# Patient Record
Sex: Female | Born: 1945
Health system: Southern US, Community
[De-identification: ages and names within clinical notes are randomized; demographics above are authoritative.]

## PROBLEM LIST (undated history)

## (undated) DIAGNOSIS — F419 Anxiety disorder, unspecified: Secondary | ICD-10-CM

## (undated) DIAGNOSIS — I499 Cardiac arrhythmia, unspecified: Secondary | ICD-10-CM

## (undated) DIAGNOSIS — F411 Generalized anxiety disorder: Secondary | ICD-10-CM

## (undated) DIAGNOSIS — I2510 Atherosclerotic heart disease of native coronary artery without angina pectoris: Secondary | ICD-10-CM

## (undated) DIAGNOSIS — M1990 Unspecified osteoarthritis, unspecified site: Secondary | ICD-10-CM

## (undated) DIAGNOSIS — I509 Heart failure, unspecified: Secondary | ICD-10-CM

## (undated) DIAGNOSIS — N189 Chronic kidney disease, unspecified: Secondary | ICD-10-CM

## (undated) DIAGNOSIS — F32A Depression, unspecified: Secondary | ICD-10-CM

## (undated) DIAGNOSIS — K219 Gastro-esophageal reflux disease without esophagitis: Secondary | ICD-10-CM

## (undated) DIAGNOSIS — E782 Mixed hyperlipidemia: Secondary | ICD-10-CM

## (undated) DIAGNOSIS — E785 Hyperlipidemia, unspecified: Secondary | ICD-10-CM

## (undated) DIAGNOSIS — F329 Major depressive disorder, single episode, unspecified: Secondary | ICD-10-CM

## (undated) DIAGNOSIS — Z85528 Personal history of other malignant neoplasm of kidney: Secondary | ICD-10-CM

## (undated) DIAGNOSIS — I10 Essential (primary) hypertension: Secondary | ICD-10-CM

## (undated) DIAGNOSIS — N183 Chronic kidney disease, stage 3 unspecified: Secondary | ICD-10-CM

## (undated) DIAGNOSIS — D631 Anemia in chronic kidney disease: Secondary | ICD-10-CM

## (undated) DIAGNOSIS — Z87442 Personal history of urinary calculi: Secondary | ICD-10-CM

## (undated) DIAGNOSIS — C801 Malignant (primary) neoplasm, unspecified: Secondary | ICD-10-CM

## (undated) HISTORY — DX: Personal history of other malignant neoplasm of kidney: Z85.528

## (undated) HISTORY — DX: Depression, unspecified: F32.A

## (undated) HISTORY — DX: Major depressive disorder, single episode, unspecified: F32.9

## (undated) HISTORY — DX: Malignant (primary) neoplasm, unspecified: C80.1

## (undated) HISTORY — DX: Essential (primary) hypertension: I10

## (undated) HISTORY — PX: ABDOMINAL HYSTERECTOMY: SHX81

## (undated) HISTORY — DX: Anxiety disorder, unspecified: F41.9

## (undated) HISTORY — PX: OTHER SURGICAL HISTORY: SHX169

## (undated) HISTORY — DX: Hyperlipidemia, unspecified: E78.5

## (undated) HISTORY — DX: Atherosclerotic heart disease of native coronary artery without angina pectoris: I25.10

---

## 1978-10-09 HISTORY — PX: VAGINAL HYSTERECTOMY: SUR661

## 1998-05-28 ENCOUNTER — Ambulatory Visit (HOSPITAL_BASED_OUTPATIENT_CLINIC_OR_DEPARTMENT_OTHER): Admission: RE | Admit: 1998-05-28 | Discharge: 1998-05-28 | Payer: Self-pay | Admitting: Surgery

## 1998-11-05 ENCOUNTER — Encounter: Payer: Self-pay | Admitting: Obstetrics and Gynecology

## 1998-11-05 ENCOUNTER — Ambulatory Visit (HOSPITAL_COMMUNITY): Admission: RE | Admit: 1998-11-05 | Discharge: 1998-11-05 | Payer: Self-pay | Admitting: Obstetrics and Gynecology

## 1999-05-17 ENCOUNTER — Other Ambulatory Visit: Admission: RE | Admit: 1999-05-17 | Discharge: 1999-05-17 | Payer: Self-pay | Admitting: Obstetrics and Gynecology

## 1999-11-23 ENCOUNTER — Encounter: Payer: Self-pay | Admitting: Obstetrics and Gynecology

## 1999-11-23 ENCOUNTER — Ambulatory Visit (HOSPITAL_COMMUNITY): Admission: RE | Admit: 1999-11-23 | Discharge: 1999-11-23 | Payer: Self-pay | Admitting: Obstetrics and Gynecology

## 2000-05-24 ENCOUNTER — Other Ambulatory Visit: Admission: RE | Admit: 2000-05-24 | Discharge: 2000-05-24 | Payer: Self-pay | Admitting: Obstetrics and Gynecology

## 2000-11-26 ENCOUNTER — Ambulatory Visit (HOSPITAL_COMMUNITY): Admission: RE | Admit: 2000-11-26 | Discharge: 2000-11-26 | Payer: Self-pay | Admitting: Obstetrics and Gynecology

## 2000-11-26 ENCOUNTER — Encounter: Payer: Self-pay | Admitting: Obstetrics and Gynecology

## 2001-06-18 ENCOUNTER — Other Ambulatory Visit: Admission: RE | Admit: 2001-06-18 | Discharge: 2001-06-18 | Payer: Self-pay | Admitting: Obstetrics and Gynecology

## 2001-12-03 ENCOUNTER — Ambulatory Visit (HOSPITAL_COMMUNITY): Admission: RE | Admit: 2001-12-03 | Discharge: 2001-12-03 | Payer: Self-pay | Admitting: Obstetrics and Gynecology

## 2001-12-03 ENCOUNTER — Encounter: Payer: Self-pay | Admitting: Obstetrics and Gynecology

## 2002-12-08 ENCOUNTER — Ambulatory Visit (HOSPITAL_COMMUNITY): Admission: RE | Admit: 2002-12-08 | Discharge: 2002-12-08 | Payer: Self-pay | Admitting: Obstetrics and Gynecology

## 2002-12-08 ENCOUNTER — Encounter: Payer: Self-pay | Admitting: Obstetrics and Gynecology

## 2003-11-20 ENCOUNTER — Encounter: Admission: RE | Admit: 2003-11-20 | Discharge: 2003-11-20 | Payer: Self-pay | Admitting: Family Medicine

## 2004-12-08 ENCOUNTER — Encounter: Admission: RE | Admit: 2004-12-08 | Discharge: 2004-12-08 | Payer: Self-pay | Admitting: Family Medicine

## 2005-12-20 ENCOUNTER — Encounter: Admission: RE | Admit: 2005-12-20 | Discharge: 2005-12-20 | Payer: Self-pay | Admitting: Family Medicine

## 2006-06-18 ENCOUNTER — Other Ambulatory Visit: Admission: RE | Admit: 2006-06-18 | Discharge: 2006-06-18 | Payer: Self-pay | Admitting: Obstetrics and Gynecology

## 2006-10-09 DIAGNOSIS — D759 Disease of blood and blood-forming organs, unspecified: Secondary | ICD-10-CM

## 2006-10-09 HISTORY — DX: Disease of blood and blood-forming organs, unspecified: D75.9

## 2006-12-08 DIAGNOSIS — Z8551 Personal history of malignant neoplasm of bladder: Secondary | ICD-10-CM

## 2006-12-08 HISTORY — DX: Personal history of malignant neoplasm of bladder: Z85.51

## 2006-12-12 ENCOUNTER — Ambulatory Visit (HOSPITAL_BASED_OUTPATIENT_CLINIC_OR_DEPARTMENT_OTHER): Admission: RE | Admit: 2006-12-12 | Discharge: 2006-12-12 | Payer: Self-pay | Admitting: Urology

## 2006-12-12 ENCOUNTER — Encounter (INDEPENDENT_AMBULATORY_CARE_PROVIDER_SITE_OTHER): Payer: Self-pay | Admitting: Specialist

## 2006-12-12 HISTORY — PX: TRANSURETHRAL RESECTION OF BLADDER TUMOR: SHX2575

## 2006-12-24 ENCOUNTER — Encounter: Admission: RE | Admit: 2006-12-24 | Discharge: 2006-12-24 | Payer: Self-pay | Admitting: Family Medicine

## 2007-01-08 DIAGNOSIS — Z8554 Personal history of malignant neoplasm of ureter: Secondary | ICD-10-CM

## 2007-01-08 HISTORY — DX: Personal history of malignant neoplasm of ureter: Z85.54

## 2007-01-09 ENCOUNTER — Ambulatory Visit (HOSPITAL_COMMUNITY): Admission: RE | Admit: 2007-01-09 | Discharge: 2007-01-09 | Payer: Self-pay | Admitting: Urology

## 2007-01-09 ENCOUNTER — Encounter (INDEPENDENT_AMBULATORY_CARE_PROVIDER_SITE_OTHER): Payer: Self-pay | Admitting: *Deleted

## 2007-01-09 ENCOUNTER — Encounter (INDEPENDENT_AMBULATORY_CARE_PROVIDER_SITE_OTHER): Payer: Self-pay | Admitting: Specialist

## 2007-01-09 HISTORY — PX: CYSTOSCOPY WITH RETROGRADE PYELOGRAM, URETEROSCOPY AND STENT PLACEMENT: SHX5789

## 2007-02-06 ENCOUNTER — Inpatient Hospital Stay (HOSPITAL_COMMUNITY): Admission: RE | Admit: 2007-02-06 | Discharge: 2007-02-10 | Payer: Self-pay | Admitting: Urology

## 2007-02-06 ENCOUNTER — Encounter (INDEPENDENT_AMBULATORY_CARE_PROVIDER_SITE_OTHER): Payer: Self-pay | Admitting: Specialist

## 2007-02-06 DIAGNOSIS — Z905 Acquired absence of kidney: Secondary | ICD-10-CM

## 2007-02-06 HISTORY — PX: NEPHROURETERECTOMY: SHX5135

## 2007-02-06 HISTORY — DX: Acquired absence of kidney: Z90.5

## 2007-12-25 ENCOUNTER — Encounter: Admission: RE | Admit: 2007-12-25 | Discharge: 2007-12-25 | Payer: Self-pay | Admitting: Family Medicine

## 2008-01-27 ENCOUNTER — Encounter: Admission: RE | Admit: 2008-01-27 | Discharge: 2008-01-27 | Payer: Self-pay | Admitting: Family Medicine

## 2008-02-14 ENCOUNTER — Ambulatory Visit (HOSPITAL_COMMUNITY): Admission: RE | Admit: 2008-02-14 | Discharge: 2008-02-14 | Payer: Self-pay | Admitting: Urology

## 2008-09-23 ENCOUNTER — Ambulatory Visit (HOSPITAL_COMMUNITY): Admission: RE | Admit: 2008-09-23 | Discharge: 2008-09-23 | Payer: Self-pay | Admitting: Urology

## 2008-12-25 ENCOUNTER — Encounter: Admission: RE | Admit: 2008-12-25 | Discharge: 2008-12-25 | Payer: Self-pay | Admitting: Family Medicine

## 2009-08-24 ENCOUNTER — Ambulatory Visit (HOSPITAL_COMMUNITY): Admission: RE | Admit: 2009-08-24 | Discharge: 2009-08-24 | Payer: Self-pay | Admitting: Urology

## 2009-10-09 HISTORY — PX: CATARACT EXTRACTION W/ INTRAOCULAR LENS IMPLANT: SHX1309

## 2009-12-27 ENCOUNTER — Encounter: Admission: RE | Admit: 2009-12-27 | Discharge: 2009-12-27 | Payer: Self-pay | Admitting: Family Medicine

## 2009-12-29 ENCOUNTER — Encounter: Admission: RE | Admit: 2009-12-29 | Discharge: 2009-12-29 | Payer: Self-pay | Admitting: Family Medicine

## 2010-07-04 ENCOUNTER — Encounter: Admission: RE | Admit: 2010-07-04 | Discharge: 2010-07-04 | Payer: Self-pay | Admitting: Family Medicine

## 2010-11-29 ENCOUNTER — Other Ambulatory Visit: Payer: Self-pay | Admitting: Family Medicine

## 2010-11-29 DIAGNOSIS — Z09 Encounter for follow-up examination after completed treatment for conditions other than malignant neoplasm: Secondary | ICD-10-CM

## 2011-01-03 ENCOUNTER — Ambulatory Visit
Admission: RE | Admit: 2011-01-03 | Discharge: 2011-01-03 | Disposition: A | Discharge status: Home / Self Care | Payer: BC Managed Care – PPO | Source: Ambulatory Visit | Attending: Family Medicine | Admitting: Family Medicine

## 2011-01-03 ENCOUNTER — Other Ambulatory Visit: Payer: Self-pay | Admitting: Family Medicine

## 2011-01-03 DIAGNOSIS — Z09 Encounter for follow-up examination after completed treatment for conditions other than malignant neoplasm: Secondary | ICD-10-CM

## 2011-02-24 NOTE — Op Note (Signed)
[name], WEICHERT             ACCOUNT NO.:  [id number]   MEDICAL RECORD NO.:  [id number]          PATIENT TYPE:  AMB   LOCATION:  DAY                          FACILITY:  [facility]   PHYSICIAN:  Bertram Millard. Dahlstedt, M.D.DATE OF BIRTH:  1946-03-09   DATE OF PROCEDURE:  01/09/2007  DATE OF DISCHARGE:                               OPERATIVE REPORT   PREOPERATIVE DIAGNOSIS:  Right hydronephrosis, atrophic right kidney,  history of bladder cancer.   POSTOPERATIVE DIAGNOSIS:  Right hydronephrosis, atrophic right kidney,  history of bladder cancer.   OPERATION PERFORMED:  Cystoscopy, bladder biopsies, bladder washings,  right ureteroscopy, bladder brush biopsy of right ureter, ureteral  washings, double-J stent placement on the right.   SURGEON:  Bertram Millard. Dahlstedt, M.D.   ANESTHESIA:  General.   COMPLICATIONS:  None.   INDICATIONS FOR PROCEDURE:  The patient is a 65 year old female who  presented to Dr. Vonita Moss in late February with gross hematuria.  Evaluation revealed a bladder tumor.  She underwent transurethral  resection of bladder tumor which revealed papillary bladder tumor,  overlying the right ureteral orifice with high grade features.  There is  no evident invasion.  CT evaluation afterwards revealed nonfunctioning  significantly hydronephrotic right kidney with visualization of ureter  all the way down to the bladder.   She was referred to me for possible right nephroureterectomy.  In  consulting on the patient, I suggested we directly visualize her right  ureter, especially the distal part, to either confirm the presence or  lack of ureteral tumor.  As the patient has an obscure ureteral orifice,  she is scheduled for percutaneous placement of right ureteral catheter  coming out her right ureteral orifice, so I can obtain access to the  ureter.  The risks and complications of reinspecting the bladder with  biopsies, washings and right ureteroscopy were discussed  at length with  the patient.  She understands these and agrees to proceed.   DESCRIPTION OF PROCEDURE:  The patient underwent placement of a right  percutaneous ureteral catheter into the bladder by Arn Medal, MD in  the radiology area.  Following this, she was taken to the holding area  where I identified the patient, marked the surgical side and then she  was taken to the operating room.  General anesthetic was administered.  She was placed in dorsal lithotomy position.  Genitalia and perineum  were prepped and draped.  I advanced a 0.038 inch guidewire through the  percutaneous catheter.  I then advanced this under fluoroscopic guidance  into the bladder.  Cystoscopy was then performed of the bladder.  She  had some bullous edema around the right trigonal area.  No discrete  papillary lesions were seen, however.  The catheter and guidewire were  seen coming from the right ureter.  Bladder washings were taken and sent  for cytology.  Two to three bladder biopsies were taken in the  trigonal area around the right ureteral orifice.  These were sent as  bladder biopsies.  I then grasped the guidewire, and advanced a 35 cm  ureteral access sheath (inner core)  up the guidewire which I advanced  out her urethra.  I then was able to place the guidewire beside the  previously placed ureteral catheter.  Under fluoroscopic guidance, this  was advanced up to the right renal pelvic area.  I then placed a  ureteroscope overtop of this and gained access to the ureter.  The  entire ureter was visualized.  The proximal, mid and most of the distal  ureter were normal although somewhat tortuous.  No lesions were seen.  Right at the ureterovesical junction, there seemed to be some  abnormalities to the mucosa.  Ureteral washings were sent, and then this  was brush biopsied. This was sent for cytology.  I am mildly suspicious  that there it tumor in this area.   At this point the ureteroscope was  removed.  There was no active  bleeding from biopsy sites.  The guidewire was left in and overtop this  a 6 French 24 cm contour stent was placed using fluoroscopic and  cystoscopic guidance.  The percutaneous tube and guidewire placed were  then removed.  The bladder was drained and the cystoscope removed.  The  patient tolerated the procedure well. She was awakened, extubated and  taken to PACU in stable condition.      Bertram Millard. Dahlstedt, M.D.  Electronically Signed     SMD/MEDQ  D:  01/09/2007  T:  01/09/2007  Job:  [id number]

## 2011-02-24 NOTE — Discharge Summary (Signed)
[name], [name]             ACCOUNT NO.:  [id number]   MEDICAL RECORD NO.:  [id number]          PATIENT TYPE:  INP   LOCATION:  1404                         FACILITY:  [facility]   PHYSICIAN:  Bertram Millard. Dahlstedt, M.D.DATE OF BIRTH:  May 10, 1946   DATE OF ADMISSION:  02/06/2007  DATE OF DISCHARGE:  02/10/2007                               DISCHARGE SUMMARY   ADMISSION DIAGNOSIS:  Right ureteral tumor with atrophic right kidney.   DISCHARGE DIAGNOSIS:  Right ureteral tumor with atrophic right kidney.   PROCEDURE:  Right laparoscopic nephroureterectomy on 02/06/2007.   HISTORY OF PRESENT ILLNESS:  For full details please see dictated H&P.  Briefly, [name] is a 65 year old female with a history of  hematuria.  [name] had evidence of papillary tumor involving the right  trigone and right ureteral orifice.  [name] was treated with TURBT and was  found to be a T1 lesion which was re-resected with negative biopsies.  [name] also had findings of a tumor in the right ureter which was confirmed  endoscopically and with a biopsy.  [name] is admitted this hospitalization  for right laparoscopic nephroureterectomy.   HOSPITAL COURSE:  The patient was brought to the hospital on the day of  procedure and taken for nephroureterectomy.  [name] tolerated this well  without complication and was transferred to the floor.  Postoperatively  her course was uncomplicated.  [name] did have blood loss anemia after  surgery which was stable throughout her recovery.  Her diet was slowly  advanced.  [name] was ambulating without difficulty and deemed stable for  discharge on postop day #4.   DISPOSITION AND DISCHARGE INSTRUCTIONS:  The patient was discharged to  home in stable condition.  [name] was instructed to resume her previous  diet.  Activity level was instructed to increase activity slowly.  [name]  may shower.  [name] was instructed not to lift for 4 weeks or drive for 2  weeks.   MEDICATIONS AT DISCHARGE:  1. Percocet  as needed for pain.  2. Keflex twice a day  3. Hydrochlorothiazide daily.  4. Zocor daily.  5. Ambien as needed.  6. Estradiol patch.  7. Iron.  8. Phenergan p.r.n.   FOLLOW UP:  [name] will follow up with Dr. Retta Diones in approximately 2  weeks for catheter removal.   [name] was instructed to call with any questions or concerns regarding any  fevers greater than 101.5 or increased abdominal pain, nausea, vomiting,  redness or drainage from her incision or if [name] should have any  difficulty with her Foley catheter.  [name] understood and agreed to these  instructions.     ______________________________  Terie Purser, MD      Bertram Millard. Dahlstedt, M.D.  Electronically Signed    JH/MEDQ  D:  02/22/2007  T:  02/22/2007  Job:  [id number]

## 2011-02-24 NOTE — Op Note (Signed)
[name], [name]             ACCOUNT NO.:  [id number]   MEDICAL RECORD NO.:  [id number]          PATIENT TYPE:  AMB   LOCATION:  NESC                         FACILITY:  [facility]   PHYSICIAN:  Maretta Bees. Vonita Moss, M.D.DATE OF BIRTH:  [birth year]   DATE OF PROCEDURE:  DATE OF DISCHARGE:                               OPERATIVE REPORT   PREOPERATIVE DIAGNOSIS:  Large bladder tumor with high-grade right  hydronephrosis in ureter.   POSTOPERATIVE DIAGNOSIS:  Large bladder tumor with high-grade right  hydronephrosis in ureter.   PROCEDURE:  Cystoscopy and transurethral resection of large bladder  tumor on right trigone.   SURGEON:  Dr. Larey Dresser.   ANESTHESIA:  General.   INDICATIONS:  This lady was referred to me for noticing blood after  wiping post urination.  She has also had some back pain and suprapubic  discomfort.  She underwent cystoscopy and she was noted to have a large  papillary tumor on the right trigone.  CT scan showed chronic marked  right hydronephrosis in ureter down to the mass in the right side of the  bladder.  There was slight compensatory hypertrophy of the left kidney  and marked thinning of the right renal cortex.  Pelvic view showed a  polypoid posterior tumor on the right trigone measuring approximately 4  x 2 cm in size.  She is brought to the OR today for resection of this  tumor and possible retrograde pyelogram or double-J catheter.   PROCEDURE:  The patient brought to operating room, placed lithotomy  position.  External genitalia were prepped and draped in usual fashion.  She was cystoscoped and the only lesion in the bladder was the  previously described large papillary tumor covering the whole right  trigone and close to the bladder neck.  The left trigone was clear and  the left ureteral orifice was unremarkable.  I inserted a resectoscope  and resected this tumor down flat with the rest of the bladder floor.  Bimanual examination revealed  no indurated or thickened masses at this  time.  I am concerned that there is still some tumor up the distal  ureter.  Fulguration was obtained to get good hemostasis.  Estimated  blood  loss was 30 mL.  View of the large area of resection, I felt best to  leave in a 22-French Foley with 10 mL in the balloon and that was  inserted.  She was taken to recovery room in good condition with clear  irrigation.  We will decide in the recovery area whether she can go home  this afternoon or not.      Maretta Bees. Vonita Moss, M.D.  Electronically Signed     LJP/MEDQ  D:  12/12/2006  T:  12/12/2006  Job:  [id number]   cc:   Gerald Leitz, MD   Duncan Dull, M.D.  Fax: [phone]

## 2011-02-24 NOTE — Op Note (Signed)
[name], [name]             ACCOUNT NO.:  [id number]   MEDICAL RECORD NO.:  [id number]          PATIENT TYPE:  INP   LOCATION:  1404                         FACILITY:  [facility]   PHYSICIAN:  Bertram Millard. Dahlstedt, M.D.DATE OF BIRTH:  [birth year]   DATE OF PROCEDURE:  02/06/2007  DATE OF DISCHARGE:                               OPERATIVE REPORT   PREOPERATIVE DIAGNOSES:  1. Right ureteral tumor.  2. Hydronephrotic, poorly-functioning right kidney.   POSTOPERATIVE DIAGNOSES:  1. Right ureteral tumor.  2. Hydronephrotic, poorly-functioning right kidney.   PROCEDURE:  Laparoscopic right nephroureterectomy.   SURGEON:  Bertram Millard. Retta Diones, M.D.   RESIDENT:  Terie Purser, MD.   ANESTHESIA:  General.   ESTIMATED BLOOD LOSS:  1000 mL.   SPECIMENS:  1. Right kidney and ureter for pathologic analysis.  2. Right posterior bladder tissue for frozen analysis.   DRAIN:  Foley catheter, JP drain.   COMPLICATIONS:  None.   DISPOSITION:  Stable to post anesthesia care unit.   INDICATIONS FOR PROCEDURE:  [name] is a 65 year old female who has  a history of hematuria.  She was evaluated and found to have a right-  sided bladder tumor involving the right trigone and right UO.  This was  resected was found to be T1 disease.  Repeat resections were negative.  Further evaluation revealed a hydronephrotic, poorly-functioning right  kidney.  Upper tract evaluation and cytology was worrisome for upper  tract involvement in transitional cell carcinoma.  She was counseled  regarding the benefits of nephroureterectomy given the upper tract  disease.  After full discussion of the benefits and risks of the  procedure, she has agreed to proceed with a laparoscopic right  nephroureterectomy.  Consent was obtained.   DESCRIPTION OF PROCEDURE:  The patient was brought to the operating room  and was properly identified.  A time-out was performed to confirm the  correct patient, procedure and  side.  She was administered general  anesthesia, given preoperative antibiotics, and then placed in the  supine position with her right shoulder and side bumped approximately 30  degrees and prepped and draped in a sterile fashion.  Special care was  taken to properly pad and position pressure points to prevent peripheral  neuropathy and DVT.  The patient was then prepped and draped in a  sterile manner.  We began the procedure by making an incision just  inferior to the umbilicus for approximately 2 cm.  This was for the  Hasson camera port.  Dissection was carried down through the  subcutaneous tissues.  The fascia was divided using the Bovie cautery.  The peritoneum was entered bluntly and a finger was swept throughout the  abdomen to ensure no adhesions.  The Hasson port was then placed after  placement of two 2-0 Vicryl stay sutures in the fascia.  The camera was  introduced and the abdomen was carefully inspected.  There was no  evidence of a bowel injury or other evidence of metastatic disease in  the abdomen.  There were no other adhesions noted.  We then proceeded  with placement of our  ports.  We placed a 5-mm port in the midline just  caudal into the xiphoid.  This was placed under direct vision.  We then  placed another 12 mm port in just medial and superior to the ASIS.  We  also placed another 5 mm port just medial to the midaxillary line as  another working port.  We then proceeded with mobilization of the right  colon.  Using the harmonic scalpel, the white line of Toldt was incised  and the colon was reflected medially.  We then proceeded with anterior  dissection over the kidney to completely mobilize the bowel from the  kidney.  The ureter was then identified caudally.  The ureter was  significantly enlarged and had a stent in place.  We were able to  develop a plane between the ureter and the psoas muscle.  The ureter was  then reflected up and we carefully dissected  proximally to the kidney.  There were several large vessels that were clipped.  We then encountered  the kidney.  The kidney was markedly abnormal with a redundant pelvis  and very minimal renal parenchyma.  We continued working superiorly off  the ureter.  We encountered what appeared to be a renal artery and vein.  These were small vessels that were triply clipped and divided sharply.  We then continued working superiorly.  At this point we did encounter  another artery and vein, which was triply clipped and divided it.  At  this point the kidney was mobilized with the exception of the upper  pole.  This was freed using the harmonic scalpel and the kidney and  ureter were reflected inferiorly.  We then carefully inspected our  resection bed.  Hemostasis was obtained.  We carefully irrigated and  examined the area of the hilum as well as surrounding the resection  sites under low pressure to ensure adequate hemostasis.  There was an  area in the superior aspect of the dissection around the abdominal wall,  which was reinforced with a Surgicel.  We then turned our attention  inferiorly and dissected the remaining portion of the ureter as far as  we could using the laparoscopic instruments.  When this was finished, we  decided to perform the remaining procedure in an open fashion.   We then removed all of our instrument ports under direct vision.  There  was no port site bleeding.  We extended our incision from the 12-mm  working port in a Moroni fashion.  Dissection was carried down through  the subcutaneous and fascial layers to enter the abdomen.  The kidney  and ureter were then delivered through the incision.  We had placed a  Bookwalter retractor and obtained exposure of the distal aspect of the  ureter and bladder.  The ureter was freed of its surrounding attachments  using combination of sharp and blunt dissection.  The round ligament was encountered and was ligated and divided.   We then dissected the ureter  free to the level of the attachment at the detrusor.  The bladder was  filled through the Foley catheter.  Two 2-0 Vicryl stay sutures were  placed in the bladder.  We then incised the bladder using the Bovie in a  keyhole fashion around the right ureter.  The ureter and ureteral  orifice were dissected free and removed.  We did encounter a fair amount  of bleeding in this portion of the procedure.  This was controlled with  Bovie  cautery as well as 2-0 Vicryl figure-of-eight sutures.  When  hemostasis was adequately obtained, we did opt to send a portion of  tissue from the right posterior aspect of the bladder near our resection  site to confirm that this did not represent tumor.  This was negative.  We then proceeded to close the bladder.  The mucosa was closed in a  running fashion with 2-0 Vicryl suture.  We then closed the second layer  of detrusor over this in a running fashion with 2-0 Vicryl.  The bladder  was then irrigated and was found to be watertight.  We then placed a  Blake drain through a separate stab incision and left this in our  resection bed.  The fascia was then closed in a running fashion with #1  PDS. The wound was then irrigated and the skin was closed with  staples.  Remaining port sites were closed with staples.  The patient  was then awoken from anesthesia and transported to the recovery room in  stable condition.  There were no complications.  Please note, Dr.  Retta Diones was present and participated in all aspects of this procedure  as he is the primary surgeon.     ______________________________  Terie Purser, MD      Bertram Millard. Dahlstedt, M.D.  Electronically Signed    JH/MEDQ  D:  02/06/2007  T:  02/07/2007  Job:  [id number]

## 2011-02-24 NOTE — H&P (Signed)
[name], [name]             ACCOUNT NO.:  [id number]   MEDICAL RECORD NO.:  [id number]          PATIENT TYPE:  AMB   LOCATION:  DAY                          FACILITY:  [facility]   PHYSICIAN:  Bertram Millard. Dahlstedt, M.D.DATE OF BIRTH:  08/19/1946   DATE OF ADMISSION:  02/06/2007  DATE OF DISCHARGE:                              HISTORY & PHYSICAL   CHIEF COMPLAINT:  Right ureteral tumor, atrophic right kidney.   HISTORY OF PRESENT ILLNESS:  [name] is a [age]-year-old female who  has a history of hematuria.  She was originally referred by her  gynecologist, a Dr. Vonita Moss.  His evaluation revealed a grossly  hydronephrotic and atrophic right kidney.  She also had evidence of a  papillary bladder tumor involving the right trigone and right ureteral  orifice.  She has undergone TURBT and this was found to be a T1 lesion.  She has been re-resected with negative biopsies.  She has also had  findings concerning for a tumor in the right ureter.  Ureteroscopy and  biopsy and cytology confirmed this.  She was counseled regarding the  need for right nephroureterectomy.  She is admitted for this this  hospitalization.   She has not had any recent fevers or chills or weight loss.  She has had  some frequency and nocturia, but this has been chronic.  She has no  history of kidney stones.   ALLERGIES:  None.   MEDICATIONS:  1. Hydrochlorothiazide.  2. Zocor.  3. Estradiol.  4. Ambien.  5. Ibuprofen p.r.n.   PAST MEDICAL HISTORY:  1. Hyperlipidemia.  2. Hypertension.   PAST SURGICAL HISTORY:  Hysterectomy.   FAMILY MEDICAL HISTORY:  Kidney stones, kidney cancer and diabetes.   SOCIAL HISTORY:  The patient has a history of smoking, although she quit  approximately 3 years ago.  She denies any alcohol use.   REVIEW OF SYSTEMS:  She has had some night sweats as well as some back  pain, joint pain and occasional diarrhea.  She has otherwise had  multisystem review performed and as  above negative.   PHYSICAL EXAMINATION:  Temperature is 96.7, heart rate 104, blood  pressure 118/79, weight 64.5 kg.  GENERAL:  A well-developed, well-nourished white female in no acute  distress.  HEENT:  Normocephalic, atraumatic.  NECK:  Supple without lymphadenopathy.  CARDIOVASCULAR:  Regular rate and rhythm,  PULMONARY:  Clear to auscultation bilaterally.  ABDOMEN:  Soft, nontender, nondistended.  GU:  Exam reveals normal external genitalia.  EXTREMITIES:  Without cyanosis, clubbing or edema.  PSYCHIATRIC:  Alert and oriented x3.   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  The patient will be admitted after  undergoing right laparoscopic nephroureterectomy.  She will be admitted  for routine postoperative care.     ______________________________  Terie Purser, MD      Bertram Millard. Dahlstedt, M.D.  Electronically Signed    JH/MEDQ  D:  02/06/2007  T:  02/06/2007  Job:  [id number]

## 2011-12-12 ENCOUNTER — Other Ambulatory Visit: Payer: Self-pay | Admitting: Family Medicine

## 2011-12-12 DIAGNOSIS — R921 Mammographic calcification found on diagnostic imaging of breast: Secondary | ICD-10-CM

## 2012-01-04 ENCOUNTER — Ambulatory Visit
Admission: RE | Admit: 2012-01-04 | Discharge: 2012-01-04 | Disposition: A | Discharge status: Home / Self Care | Payer: BC Managed Care – PPO | Source: Ambulatory Visit | Attending: Family Medicine | Admitting: Family Medicine

## 2012-01-04 DIAGNOSIS — R921 Mammographic calcification found on diagnostic imaging of breast: Secondary | ICD-10-CM

## 2012-12-09 ENCOUNTER — Other Ambulatory Visit: Payer: Self-pay

## 2012-12-09 DIAGNOSIS — Z1231 Encounter for screening mammogram for malignant neoplasm of breast: Secondary | ICD-10-CM

## 2013-01-06 ENCOUNTER — Ambulatory Visit
Admission: RE | Admit: 2013-01-06 | Discharge: 2013-01-06 | Disposition: A | Discharge status: Home / Self Care | Payer: BC Managed Care – PPO | Source: Ambulatory Visit

## 2013-01-06 DIAGNOSIS — Z1231 Encounter for screening mammogram for malignant neoplasm of breast: Secondary | ICD-10-CM

## 2013-12-05 ENCOUNTER — Ambulatory Visit (INDEPENDENT_AMBULATORY_CARE_PROVIDER_SITE_OTHER): Payer: BC Managed Care – PPO

## 2013-12-05 VITALS — BP 126/67 | HR 67 | Resp 16 | Ht 63.0 in | Wt 160.0 lb

## 2013-12-05 DIAGNOSIS — B351 Tinea unguium: Secondary | ICD-10-CM

## 2013-12-05 DIAGNOSIS — L600 Ingrowing nail: Secondary | ICD-10-CM

## 2013-12-05 DIAGNOSIS — M79609 Pain in unspecified limb: Secondary | ICD-10-CM

## 2013-12-05 MED ORDER — CEPHALEXIN 500 MG PO CAPS: 500.0000 mg | ORAL_CAPSULE | Freq: Three times a day (TID) | ORAL | Status: DC

## 2013-12-05 NOTE — Progress Notes (Signed)
   Subjective:    Patient ID: [name], female    DOB: 1946/09/04, 68 y.o.   MRN: [id number]  HPI Comments: "I have a bad toenail"  Patient c/o painful 1st toe left foot for about 6-12 months now. The toenail is discolored and thick. It has gotten worse recently. Gets pedicures once a month and usually has her toenails painted. No treatments tried.     Review of Systems  Respiratory: Positive for cough.   Musculoskeletal: Positive for back pain.  Skin:       Change in nails   All other systems reviewed and are negative.       Objective:   Physical Exam Neurovascular status is intact pedal pulses palpable epicritic and proprioceptive sensations intact and symmetric bilateral. There is no plantar response DTRs not listed neurologically skin color pigment normal hair growth absent to prescription dose incurvation of the left hallux nail plate medial border which is painful tender and symptomatic severe incurvated and tender with erythema the medial nail fold yellowing and discoloration the nail plate is noted consistent with onychomycosis as well. Orthopedic biomechanical exam unremarkable noncontributory no ascending psoas lymphangitis noted no secondary infections       Assessment & Plan:  Assessment ingrowing nail with onychomycosis left great toe medial border with dystrophy patient [name] and trauma to the nail so than the past this correlates with her pain in symptomology recommendation at this time is for nail excision and phenol matricectomy medial border risk O.'s reviewed at this time local anesthetic in [name] first performed the medial border is excised in a meniscectomy followed by alcohol wash Silvadene cream and gauze dressing being applied to the left foot. Patient did Tylenol as he for pain prescription for cephalexin is 4 to pharmacy recheck in 2-3 weeks for followup for nail check. In the future will also follow up with topical or oral oral antifungal as needed.  Next  [name] DPM

## 2013-12-05 NOTE — Patient Instructions (Signed)

## 2013-12-19 ENCOUNTER — Ambulatory Visit (INDEPENDENT_AMBULATORY_CARE_PROVIDER_SITE_OTHER): Payer: BC Managed Care – PPO

## 2013-12-19 VITALS — BP 123/62 | HR 70 | Resp 16

## 2013-12-19 DIAGNOSIS — L600 Ingrowing nail: Secondary | ICD-10-CM

## 2013-12-19 DIAGNOSIS — B351 Tinea unguium: Secondary | ICD-10-CM

## 2013-12-19 MED ORDER — TAVABOROLE 5 % EX SOLN: CUTANEOUS | Status: DC

## 2013-12-19 NOTE — Patient Instructions (Signed)
Onychomycosis/Fungal Toenails  WHAT IS IT? An infection that lies within the keratin of your nail plate that is caused by a fungus.  WHY ME? Fungal infections affect all ages, sexes, races, and creeds.  There may be many factors that predispose you to a fungal infection such as age, coexisting medical conditions such as diabetes, or an autoimmune disease; stress, medications, fatigue, genetics, etc.  Bottom line: fungus thrives in a warm, moist environment and your shoes offer such a location.  IS IT CONTAGIOUS? Theoretically, yes.  You do not want to share shoes, nail clippers or files with someone who has fungal toenails.  Walking around barefoot in the same room or sleeping in the same bed is unlikely to transfer the organism.  It is important to realize, however, that fungus can spread easily from one nail to the next on the same foot.  HOW DO WE TREAT THIS?  There are several ways to treat this condition.  Treatment may depend on many factors such as age, medications, pregnancy, liver and kidney conditions, etc.  It is best to ask your doctor which options are available to you.  1. No treatment.   Unlike many other medical concerns, you can live with this condition.  However for many people this can be a painful condition and may lead to ingrown toenails or a bacterial infection.  It is recommended that you keep the nails cut short to help reduce the amount of fungal nail. 2. Topical treatment.  These range from herbal remedies to prescription strength nail lacquers.  About 40-50% effective, topicals require twice daily application for approximately 9 to 12 months or until an entirely new nail has grown out.  The most effective topicals are medical grade medications available through physicians offices. 3. Oral antifungal medications.  With an 80-90% cure rate, the most common oral medication requires 3 to 4 months of therapy and stays in your system for a year as the new nail grows out.  Oral  antifungal medications do require blood work to make sure it is a safe drug for you.  A liver function panel will be performed prior to starting the medication and after the first month of treatment.  It is important to have the blood work performed to avoid any harmful side effects.  In general, this medication safe but blood work is required. 4. Laser Therapy.  This treatment is performed by applying a specialized laser to the affected nail plate.  This therapy is noninvasive, fast, and non-painful.  It is not covered by insurance and is therefore, out of pocket.  The results have been very good with a 80-95% cure rate.  The [facility] is the only practice in the area to offer this therapy. 5. Permanent Nail Avulsion.  Removing the entire nail so that a new nail will not grow back.  Applied a prescription topical antifungal once daily to each affected nails for 12 months duration. The prescription will be sent to your home directly from the underlying pharmacy [facility]

## 2013-12-19 NOTE — Progress Notes (Signed)
   Subjective:    Patient ID: [name], female    DOB: [birth year], 68 y.o.   MRN: [id number]  HPI Comments: "Its better, but its still sore."  Follow up AP nail 1st toe left - medial     Review of Systems no new changes or finding     Objective:   Physical Exam Neurovascular status intact medial border left hallux is doing well and eschar tissue and slight tenderness patient slight discoloration in the hallux nail as well as adjacent nails on the left foot and multiple nails in the right foot at this time initiate a prescription for keratin topical antifungal be applied daily for 12 months duration prescriptions [facility]       Assessment & Plan:  Assessment resolving AP nail procedure for onychomycosis of nails and ingrown nail we'll initiate topical antifungal therapy at this time for 12 months duration recheck in 6 months for followup next  [name] DPM

## 2013-12-22 ENCOUNTER — Other Ambulatory Visit: Payer: Self-pay

## 2013-12-22 DIAGNOSIS — Z1231 Encounter for screening mammogram for malignant neoplasm of breast: Secondary | ICD-10-CM

## 2014-01-07 ENCOUNTER — Ambulatory Visit
Admission: RE | Admit: 2014-01-07 | Discharge: 2014-01-07 | Disposition: A | Discharge status: Home / Self Care | Payer: BC Managed Care – PPO | Source: Ambulatory Visit

## 2014-01-07 DIAGNOSIS — Z1231 Encounter for screening mammogram for malignant neoplasm of breast: Secondary | ICD-10-CM

## 2014-01-27 ENCOUNTER — Telehealth: Payer: Self-pay | Admitting: *Deleted

## 2014-01-27 NOTE — Telephone Encounter (Signed)
I need a refill on my foot medicine.  He was supposed to fill it out and send it so I can put it in the mail.  I called [facility] and spoke to a representative and asked him the procedure for getting a refill.  He stated they call the patient when a refill is due.  They attempted to call the patient and she did not return her call.  He stated he would go ahead and have her medicine shipped out today.  I called and left a message of this information for the patient.  Asked to call with any further questions.

## 2014-04-14 ENCOUNTER — Telehealth: Payer: Self-pay

## 2014-04-14 NOTE — Telephone Encounter (Signed)
PT WOULD LIKE REFILL ON RX

## 2014-04-15 DIAGNOSIS — Z8719 Personal history of other diseases of the digestive system: Secondary | ICD-10-CM | POA: Diagnosis not present

## 2014-04-15 DIAGNOSIS — R1032 Left lower quadrant pain: Secondary | ICD-10-CM | POA: Diagnosis not present

## 2014-04-15 DIAGNOSIS — K5730 Diverticulosis of large intestine without perforation or abscess without bleeding: Secondary | ICD-10-CM | POA: Diagnosis not present

## 2014-04-15 MED ORDER — TAVABOROLE 5 % EX SOLN: CUTANEOUS | Status: DC

## 2014-04-15 NOTE — Telephone Encounter (Signed)
Sent refill over to pharmacy

## 2014-04-23 ENCOUNTER — Other Ambulatory Visit: Payer: Self-pay | Admitting: Dermatology

## 2014-04-23 DIAGNOSIS — D239 Other benign neoplasm of skin, unspecified: Secondary | ICD-10-CM | POA: Diagnosis not present

## 2014-04-23 DIAGNOSIS — L919 Hypertrophic disorder of the skin, unspecified: Secondary | ICD-10-CM | POA: Diagnosis not present

## 2014-04-23 DIAGNOSIS — D485 Neoplasm of uncertain behavior of skin: Secondary | ICD-10-CM | POA: Diagnosis not present

## 2014-04-23 DIAGNOSIS — L570 Actinic keratosis: Secondary | ICD-10-CM | POA: Diagnosis not present

## 2014-04-23 DIAGNOSIS — D2330 Other benign neoplasm of skin of unspecified part of face: Secondary | ICD-10-CM | POA: Diagnosis not present

## 2014-04-23 DIAGNOSIS — L909 Atrophic disorder of skin, unspecified: Secondary | ICD-10-CM | POA: Diagnosis not present

## 2014-04-23 DIAGNOSIS — D1801 Hemangioma of skin and subcutaneous tissue: Secondary | ICD-10-CM | POA: Diagnosis not present

## 2014-04-23 DIAGNOSIS — L819 Disorder of pigmentation, unspecified: Secondary | ICD-10-CM | POA: Diagnosis not present

## 2014-04-23 DIAGNOSIS — L821 Other seborrheic keratosis: Secondary | ICD-10-CM | POA: Diagnosis not present

## 2014-05-07 DIAGNOSIS — L0231 Cutaneous abscess of buttock: Secondary | ICD-10-CM | POA: Diagnosis not present

## 2014-05-07 DIAGNOSIS — L03317 Cellulitis of buttock: Secondary | ICD-10-CM | POA: Diagnosis not present

## 2014-05-08 DIAGNOSIS — L03317 Cellulitis of buttock: Secondary | ICD-10-CM | POA: Diagnosis not present

## 2014-05-08 DIAGNOSIS — L0231 Cutaneous abscess of buttock: Secondary | ICD-10-CM | POA: Diagnosis not present

## 2014-06-14 ENCOUNTER — Encounter: Payer: Self-pay | Admitting: *Deleted

## 2014-06-26 ENCOUNTER — Ambulatory Visit (INDEPENDENT_AMBULATORY_CARE_PROVIDER_SITE_OTHER): Payer: BC Managed Care – PPO

## 2014-06-26 VITALS — BP 132/76 | HR 77 | Resp 12

## 2014-06-26 DIAGNOSIS — B351 Tinea unguium: Secondary | ICD-10-CM

## 2014-06-26 NOTE — Progress Notes (Signed)
   Subjective:    Patient ID: [name], female    DOB: [birth year], 68 y.o.   MRN: [id number]  HPI patient presents for long-term fungal nail check indicates is doing much better    Review of Systems no new findings or systemic changes     Objective:   Physical Exam Neurovascular status is intact pedal both pedal pulses are palpable epicritic and proprioceptive sensations intact patient toe medial and tender as far as the nail dystrophy of the borders have been excised of the proximal portion was 90% of the nail plate has resolved of any fungus or discoloration and brittleness there still some white and superficial brittleness of the distal 5% of nail or less 90-95% has resolved are cleared at this time. No pain or symptomology on palpation       Assessment & Plan:  Are assessment resolving onychomycosis are fungus of nail left great toe also well-healed status post matricectomy of excision of the borders. Continue with the keratin for 6 more months as instructed however afterwards also recommended using the topical once monthly as a preventative agent. Patient discharged as-needed basis for followup  [name] DPM

## 2014-06-26 NOTE — Patient Instructions (Signed)
Onychomycosis/Fungal Toenails  WHAT IS IT? An infection that lies within the keratin of your nail plate that is caused by a fungus.  WHY ME? Fungal infections affect all ages, sexes, races, and creeds.  There may be many factors that predispose you to a fungal infection such as age, coexisting medical conditions such as diabetes, or an autoimmune disease; stress, medications, fatigue, genetics, etc.  Bottom line: fungus thrives in a warm, moist environment and your shoes offer such a location.  IS IT CONTAGIOUS? Theoretically, yes.  You do not want to share shoes, nail clippers or files with someone who has fungal toenails.  Walking around barefoot in the same room or sleeping in the same bed is unlikely to transfer the organism.  It is important to realize, however, that fungus can spread easily from one nail to the next on the same foot.  HOW DO WE TREAT THIS?  There are several ways to treat this condition.  Treatment may depend on many factors such as age, medications, pregnancy, liver and kidney conditions, etc.  It is best to ask your doctor which options are available to you.  1. No treatment.   Unlike many other medical concerns, you can live with this condition.  However for many people this can be a painful condition and may lead to ingrown toenails or a bacterial infection.  It is recommended that you keep the nails cut short to help reduce the amount of fungal nail. 2. Topical treatment.  These range from herbal remedies to prescription strength nail lacquers.  About 40-50% effective, topicals require twice daily application for approximately 9 to 12 months or until an entirely new nail has grown out.  The most effective topicals are medical grade medications available through physicians offices. 3. Oral antifungal medications.  With an 80-90% cure rate, the most common oral medication requires 3 to 4 months of therapy and stays in your system for a year as the new nail grows out.  Oral  antifungal medications do require blood work to make sure it is a safe drug for you.  A liver function panel will be performed prior to starting the medication and after the first month of treatment.  It is important to have the blood work performed to avoid any harmful side effects.  In general, this medication safe but blood work is required. 4. Laser Therapy.  This treatment is performed by applying a specialized laser to the affected nail plate.  This therapy is noninvasive, fast, and non-painful.  It is not covered by insurance and is therefore, out of pocket.  The results have been very good with a 80-95% cure rate.  The [facility] is the only practice in the area to offer this therapy. 5. Permanent Nail Avulsion.  Removing the entire nail so that a new nail will not grow back.   Once nail has completely healed her result consider using a topical medication once a month as a preventative to stop any recurrence in the future. Can use over-the-counter Fungi-Nail which can be obtained at any pharmacy without prescription

## 2014-07-24 DIAGNOSIS — C669 Malignant neoplasm of unspecified ureter: Secondary | ICD-10-CM | POA: Diagnosis not present

## 2014-07-24 DIAGNOSIS — C670 Malignant neoplasm of trigone of bladder: Secondary | ICD-10-CM | POA: Diagnosis not present

## 2014-08-03 DIAGNOSIS — Z23 Encounter for immunization: Secondary | ICD-10-CM | POA: Diagnosis not present

## 2014-09-07 DIAGNOSIS — Z23 Encounter for immunization: Secondary | ICD-10-CM | POA: Diagnosis not present

## 2014-09-07 DIAGNOSIS — E780 Pure hypercholesterolemia: Secondary | ICD-10-CM | POA: Diagnosis not present

## 2014-09-07 DIAGNOSIS — I10 Essential (primary) hypertension: Secondary | ICD-10-CM | POA: Diagnosis not present

## 2014-09-07 DIAGNOSIS — E559 Vitamin D deficiency, unspecified: Secondary | ICD-10-CM | POA: Diagnosis not present

## 2014-09-07 DIAGNOSIS — N183 Chronic kidney disease, stage 3 (moderate): Secondary | ICD-10-CM | POA: Diagnosis not present

## 2014-09-07 DIAGNOSIS — Z8551 Personal history of malignant neoplasm of bladder: Secondary | ICD-10-CM | POA: Diagnosis not present

## 2014-09-07 DIAGNOSIS — F419 Anxiety disorder, unspecified: Secondary | ICD-10-CM | POA: Diagnosis not present

## 2014-09-07 DIAGNOSIS — Z0000 Encounter for general adult medical examination without abnormal findings: Secondary | ICD-10-CM | POA: Diagnosis not present

## 2014-10-05 DIAGNOSIS — I10 Essential (primary) hypertension: Secondary | ICD-10-CM | POA: Diagnosis not present

## 2014-10-05 DIAGNOSIS — N183 Chronic kidney disease, stage 3 (moderate): Secondary | ICD-10-CM | POA: Diagnosis not present

## 2014-12-11 ENCOUNTER — Other Ambulatory Visit: Payer: Self-pay

## 2014-12-11 DIAGNOSIS — Z1231 Encounter for screening mammogram for malignant neoplasm of breast: Secondary | ICD-10-CM

## 2015-01-11 ENCOUNTER — Ambulatory Visit
Admission: RE | Admit: 2015-01-11 | Discharge: 2015-01-11 | Disposition: A | Discharge status: Home / Self Care | Payer: BC Managed Care – PPO | Source: Ambulatory Visit

## 2015-01-11 DIAGNOSIS — Z1231 Encounter for screening mammogram for malignant neoplasm of breast: Secondary | ICD-10-CM

## 2015-04-22 DIAGNOSIS — L918 Other hypertrophic disorders of the skin: Secondary | ICD-10-CM | POA: Diagnosis not present

## 2015-04-22 DIAGNOSIS — D225 Melanocytic nevi of trunk: Secondary | ICD-10-CM | POA: Diagnosis not present

## 2015-04-22 DIAGNOSIS — C44319 Basal cell carcinoma of skin of other parts of face: Secondary | ICD-10-CM | POA: Diagnosis not present

## 2015-04-22 DIAGNOSIS — D485 Neoplasm of uncertain behavior of skin: Secondary | ICD-10-CM | POA: Diagnosis not present

## 2015-04-22 DIAGNOSIS — D224 Melanocytic nevi of scalp and neck: Secondary | ICD-10-CM | POA: Diagnosis not present

## 2015-04-22 DIAGNOSIS — L820 Inflamed seborrheic keratosis: Secondary | ICD-10-CM | POA: Diagnosis not present

## 2015-04-22 DIAGNOSIS — D2272 Melanocytic nevi of left lower limb, including hip: Secondary | ICD-10-CM | POA: Diagnosis not present

## 2015-04-22 DIAGNOSIS — L814 Other melanin hyperpigmentation: Secondary | ICD-10-CM | POA: Diagnosis not present

## 2015-04-22 DIAGNOSIS — L821 Other seborrheic keratosis: Secondary | ICD-10-CM | POA: Diagnosis not present

## 2015-04-22 DIAGNOSIS — C44112 Basal cell carcinoma of skin of right eyelid, including canthus: Secondary | ICD-10-CM | POA: Diagnosis not present

## 2015-05-05 DIAGNOSIS — Z85828 Personal history of other malignant neoplasm of skin: Secondary | ICD-10-CM | POA: Diagnosis not present

## 2015-05-05 DIAGNOSIS — C44319 Basal cell carcinoma of skin of other parts of face: Secondary | ICD-10-CM | POA: Diagnosis not present

## 2015-08-06 DIAGNOSIS — C670 Malignant neoplasm of trigone of bladder: Secondary | ICD-10-CM | POA: Diagnosis not present

## 2015-08-06 DIAGNOSIS — C669 Malignant neoplasm of unspecified ureter: Secondary | ICD-10-CM | POA: Diagnosis not present

## 2015-09-06 DIAGNOSIS — L0231 Cutaneous abscess of buttock: Secondary | ICD-10-CM | POA: Diagnosis not present

## 2015-09-23 DIAGNOSIS — I10 Essential (primary) hypertension: Secondary | ICD-10-CM | POA: Diagnosis not present

## 2015-09-23 DIAGNOSIS — Z0000 Encounter for general adult medical examination without abnormal findings: Secondary | ICD-10-CM | POA: Diagnosis not present

## 2015-09-23 DIAGNOSIS — Z8551 Personal history of malignant neoplasm of bladder: Secondary | ICD-10-CM | POA: Diagnosis not present

## 2015-09-23 DIAGNOSIS — E559 Vitamin D deficiency, unspecified: Secondary | ICD-10-CM | POA: Diagnosis not present

## 2015-09-23 DIAGNOSIS — L03319 Cellulitis of trunk, unspecified: Secondary | ICD-10-CM | POA: Diagnosis not present

## 2015-09-23 DIAGNOSIS — M8580 Other specified disorders of bone density and structure, unspecified site: Secondary | ICD-10-CM | POA: Diagnosis not present

## 2015-09-23 DIAGNOSIS — F419 Anxiety disorder, unspecified: Secondary | ICD-10-CM | POA: Diagnosis not present

## 2015-09-23 DIAGNOSIS — E7800 Pure hypercholesterolemia, unspecified: Secondary | ICD-10-CM | POA: Diagnosis not present

## 2015-09-23 DIAGNOSIS — N183 Chronic kidney disease, stage 3 (moderate): Secondary | ICD-10-CM | POA: Diagnosis not present

## 2015-09-27 ENCOUNTER — Encounter: Payer: Self-pay | Admitting: Acute Care

## 2015-09-27 ENCOUNTER — Telehealth: Payer: Self-pay | Admitting: Acute Care

## 2015-09-27 NOTE — Telephone Encounter (Signed)
Spoke to patient. Patient stated she smoked .5ppd for 38 years. She is a 19 pack year smoker. With this information she does not qualify for the program. The program requires the patient to be a 30 pack year smoker.  Informed patient of this information and will forward to dr. Inda Merlin. Nothing further needed at this time.

## 2015-10-01 DIAGNOSIS — L820 Inflamed seborrheic keratosis: Secondary | ICD-10-CM | POA: Diagnosis not present

## 2015-10-01 DIAGNOSIS — D485 Neoplasm of uncertain behavior of skin: Secondary | ICD-10-CM | POA: Diagnosis not present

## 2015-10-01 DIAGNOSIS — Z85828 Personal history of other malignant neoplasm of skin: Secondary | ICD-10-CM | POA: Diagnosis not present

## 2015-11-01 DIAGNOSIS — Z780 Asymptomatic menopausal state: Secondary | ICD-10-CM | POA: Diagnosis not present

## 2015-11-01 DIAGNOSIS — M859 Disorder of bone density and structure, unspecified: Secondary | ICD-10-CM | POA: Diagnosis not present

## 2015-11-15 DIAGNOSIS — E7800 Pure hypercholesterolemia, unspecified: Secondary | ICD-10-CM | POA: Diagnosis not present

## 2015-11-15 DIAGNOSIS — H9209 Otalgia, unspecified ear: Secondary | ICD-10-CM | POA: Diagnosis not present

## 2015-11-15 DIAGNOSIS — F419 Anxiety disorder, unspecified: Secondary | ICD-10-CM | POA: Diagnosis not present

## 2015-11-18 DIAGNOSIS — M542 Cervicalgia: Secondary | ICD-10-CM | POA: Diagnosis not present

## 2015-12-01 DIAGNOSIS — M542 Cervicalgia: Secondary | ICD-10-CM | POA: Diagnosis not present

## 2015-12-06 ENCOUNTER — Other Ambulatory Visit: Payer: Self-pay

## 2015-12-06 DIAGNOSIS — Z1231 Encounter for screening mammogram for malignant neoplasm of breast: Secondary | ICD-10-CM

## 2015-12-14 DIAGNOSIS — M542 Cervicalgia: Secondary | ICD-10-CM | POA: Diagnosis not present

## 2015-12-16 DIAGNOSIS — M542 Cervicalgia: Secondary | ICD-10-CM | POA: Diagnosis not present

## 2016-01-12 ENCOUNTER — Ambulatory Visit
Admission: RE | Admit: 2016-01-12 | Discharge: 2016-01-12 | Disposition: A | Discharge status: Home / Self Care | Payer: Medicare PPO | Source: Ambulatory Visit

## 2016-01-12 DIAGNOSIS — Z1231 Encounter for screening mammogram for malignant neoplasm of breast: Secondary | ICD-10-CM | POA: Diagnosis not present

## 2016-03-23 DIAGNOSIS — N3000 Acute cystitis without hematuria: Secondary | ICD-10-CM | POA: Diagnosis not present

## 2016-03-29 DIAGNOSIS — E7800 Pure hypercholesterolemia, unspecified: Secondary | ICD-10-CM | POA: Diagnosis not present

## 2016-03-29 DIAGNOSIS — I10 Essential (primary) hypertension: Secondary | ICD-10-CM | POA: Diagnosis not present

## 2016-03-29 DIAGNOSIS — G4700 Insomnia, unspecified: Secondary | ICD-10-CM | POA: Diagnosis not present

## 2016-03-29 DIAGNOSIS — F419 Anxiety disorder, unspecified: Secondary | ICD-10-CM | POA: Diagnosis not present

## 2016-06-22 DIAGNOSIS — Z85828 Personal history of other malignant neoplasm of skin: Secondary | ICD-10-CM | POA: Diagnosis not present

## 2016-06-22 DIAGNOSIS — D224 Melanocytic nevi of scalp and neck: Secondary | ICD-10-CM | POA: Diagnosis not present

## 2016-06-22 DIAGNOSIS — D1801 Hemangioma of skin and subcutaneous tissue: Secondary | ICD-10-CM | POA: Diagnosis not present

## 2016-06-22 DIAGNOSIS — L820 Inflamed seborrheic keratosis: Secondary | ICD-10-CM | POA: Diagnosis not present

## 2016-06-22 DIAGNOSIS — L700 Acne vulgaris: Secondary | ICD-10-CM | POA: Diagnosis not present

## 2016-06-22 DIAGNOSIS — D225 Melanocytic nevi of trunk: Secondary | ICD-10-CM | POA: Diagnosis not present

## 2016-06-22 DIAGNOSIS — D2272 Melanocytic nevi of left lower limb, including hip: Secondary | ICD-10-CM | POA: Diagnosis not present

## 2016-06-22 DIAGNOSIS — L680 Hirsutism: Secondary | ICD-10-CM | POA: Diagnosis not present

## 2016-06-22 DIAGNOSIS — L821 Other seborrheic keratosis: Secondary | ICD-10-CM | POA: Diagnosis not present

## 2016-08-04 DIAGNOSIS — C661 Malignant neoplasm of right ureter: Secondary | ICD-10-CM | POA: Diagnosis not present

## 2016-10-05 DIAGNOSIS — Z0000 Encounter for general adult medical examination without abnormal findings: Secondary | ICD-10-CM | POA: Diagnosis not present

## 2016-10-05 DIAGNOSIS — E7800 Pure hypercholesterolemia, unspecified: Secondary | ICD-10-CM | POA: Diagnosis not present

## 2016-10-05 DIAGNOSIS — I10 Essential (primary) hypertension: Secondary | ICD-10-CM | POA: Diagnosis not present

## 2016-10-05 DIAGNOSIS — Z8551 Personal history of malignant neoplasm of bladder: Secondary | ICD-10-CM | POA: Diagnosis not present

## 2016-10-05 DIAGNOSIS — G4700 Insomnia, unspecified: Secondary | ICD-10-CM | POA: Diagnosis not present

## 2016-10-05 DIAGNOSIS — E559 Vitamin D deficiency, unspecified: Secondary | ICD-10-CM | POA: Diagnosis not present

## 2016-10-05 DIAGNOSIS — M545 Low back pain: Secondary | ICD-10-CM | POA: Diagnosis not present

## 2016-10-05 DIAGNOSIS — F419 Anxiety disorder, unspecified: Secondary | ICD-10-CM | POA: Diagnosis not present

## 2016-11-30 ENCOUNTER — Other Ambulatory Visit: Payer: Self-pay | Admitting: Family Medicine

## 2016-11-30 DIAGNOSIS — Z1231 Encounter for screening mammogram for malignant neoplasm of breast: Secondary | ICD-10-CM

## 2017-01-04 ENCOUNTER — Emergency Department (HOSPITAL_COMMUNITY)
Admission: EM | Admit: 2017-01-04 | Discharge: 2017-01-04 | Disposition: A | Discharge status: Home / Self Care | Payer: Medicare Other | Attending: Emergency Medicine | Admitting: Emergency Medicine

## 2017-01-04 ENCOUNTER — Encounter (HOSPITAL_COMMUNITY): Payer: Self-pay

## 2017-01-04 DIAGNOSIS — S0990XA Unspecified injury of head, initial encounter: Secondary | ICD-10-CM

## 2017-01-04 DIAGNOSIS — I10 Essential (primary) hypertension: Secondary | ICD-10-CM | POA: Diagnosis not present

## 2017-01-04 DIAGNOSIS — Y92009 Unspecified place in unspecified non-institutional (private) residence as the place of occurrence of the external cause: Secondary | ICD-10-CM | POA: Insufficient documentation

## 2017-01-04 DIAGNOSIS — Z23 Encounter for immunization: Secondary | ICD-10-CM | POA: Diagnosis not present

## 2017-01-04 DIAGNOSIS — W228XXA Striking against or struck by other objects, initial encounter: Secondary | ICD-10-CM | POA: Insufficient documentation

## 2017-01-04 DIAGNOSIS — Y939 Activity, unspecified: Secondary | ICD-10-CM | POA: Diagnosis not present

## 2017-01-04 DIAGNOSIS — S0181XA Laceration without foreign body of other part of head, initial encounter: Secondary | ICD-10-CM | POA: Diagnosis not present

## 2017-01-04 DIAGNOSIS — Z87891 Personal history of nicotine dependence: Secondary | ICD-10-CM | POA: Insufficient documentation

## 2017-01-04 DIAGNOSIS — Y999 Unspecified external cause status: Secondary | ICD-10-CM | POA: Diagnosis not present

## 2017-01-04 DIAGNOSIS — S0101XA Laceration without foreign body of scalp, initial encounter: Secondary | ICD-10-CM

## 2017-01-04 DIAGNOSIS — Z79899 Other long term (current) drug therapy: Secondary | ICD-10-CM | POA: Insufficient documentation

## 2017-01-04 MED ORDER — KETOROLAC TROMETHAMINE 60 MG/2ML IM SOLN
30.0000 mg | Freq: Once | INTRAMUSCULAR | Status: AC
  Administered 2017-01-04: 30 mg via INTRAMUSCULAR
  Filled 2017-01-04: qty 2

## 2017-01-04 MED ORDER — TETANUS-DIPHTH-ACELL PERTUSSIS 5-2.5-18.5 LF-MCG/0.5 IM SUSP
0.5000 mL | Freq: Once | INTRAMUSCULAR | Status: AC
  Administered 2017-01-04: 0.5 mL via INTRAMUSCULAR
  Filled 2017-01-04: qty 0.5

## 2017-01-04 MED ORDER — HYDROCODONE-ACETAMINOPHEN 5-325 MG PO TABS
1.0000 | ORAL_TABLET | Freq: Once | ORAL | Status: AC
  Administered 2017-01-04: 1 via ORAL
  Filled 2017-01-04: qty 1

## 2017-01-04 MED ORDER — HYDROCODONE-ACETAMINOPHEN 5-325 MG PO TABS: 1.0000 | ORAL_TABLET | Freq: Four times a day (QID) | ORAL | 0 refills | Status: DC | PRN

## 2017-01-04 MED ORDER — LIDOCAINE-EPINEPHRINE-TETRACAINE (LET) SOLUTION
3.0000 mL | Freq: Once | NASAL | Status: AC
  Administered 2017-01-04: 3 mL via TOPICAL
  Filled 2017-01-04: qty 3

## 2017-01-04 NOTE — ED Triage Notes (Signed)
Per Pt, Pt is coming from shopping when she was trying to close her trunk. Pt closed it on her head and reports a laceration noted to the anterior of her head. Bleeding controlled. Denies any LOC. Alert and Oriented x4

## 2017-01-04 NOTE — ED Provider Notes (Signed)
[facility] DEPT Provider Note   CSN: [id number] Arrival date & time: 01/04/17  1133   By signing my name below, I, Eunice Blase, attest that this documentation has been prepared under the direction and in the presence of Merrily Pew, MD. Electronically signed, Eunice Blase, ED Scribe. 01/04/17. 12:54 AM.   History   Chief Complaint Chief Complaint  Patient presents with  . Head Laceration   The history is provided by the patient, medical records and a relative. No language interpreter was used.    HPI Comments: [name] is a 71 y.o. female who presents to the Emergency Department complaining of head pain following a laceration that she sustained to the top left of the scalp just behind the hairline shortly PTA. She states the trunk of her Lucianne Lei struck her head at home today and she denies LOC. Blood noted surrounding the area and bleeding controlled without intervention. Pt denies headache, vision changes, numbness, weakness, blood thinner use, syncope, confusion, gait problems and dizziness.     Past Medical History:  Diagnosis Date  . Anxiety   . Cancer ([place])   . Depression   . History of kidney cancer   . Hyperlipidemia   . Hypertension     There are no active problems to display for this patient.   Past Surgical History:  Procedure Laterality Date  . bladder-partial removal    . kidney removed      OB History    No data available       Home Medications    Prior to Admission medications   Medication Sig Start Date End Date Taking? Authorizing Provider  calcium carbonate 200 MG capsule Take 250 mg by mouth 2 (two) times daily with a meal.    Historical Provider, MD  cephALEXin (KEFLEX) 500 MG capsule Take 1 capsule (500 mg total) by mouth 3 (three) times daily. 12/05/13   [name], DPM  cholecalciferol (VITAMIN D) 1000 UNITS tablet Take 1,000 Units by mouth daily.    Historical Provider, MD  HYDROCHLOROTHIAZIDE PO Take by mouth.     Historical Provider, MD  HYDROcodone-acetaminophen (NORCO/VICODIN) 5-325 MG tablet Take 1 tablet by mouth every 6 (six) hours as needed for severe pain. 01/04/17   Merrily Pew, MD  sertraline (ZOLOFT) 50 MG tablet Take 50 mg by mouth daily.    Historical Provider, MD  simvastatin (ZOCOR) 20 MG tablet Take 20 mg by mouth daily.    Historical Provider, MD  Tavaborole (KERYDIN) 5 % SOLN Apply 1 drop each affected nail once daily for 12 months duration 04/15/14   [name], DPM  TRAZODONE HCL PO Take by mouth.    Historical Provider, MD    Family History No family history on file.  Social History Social History  Substance Use Topics  . Smoking status: Former Smoker    Packs/day: 0.50    Years: 38.00    Types: Cigarettes    Quit date: 11/26/2003  . Smokeless tobacco: Never Used  . Alcohol use No     Allergies   Erythromycin and Other   Review of Systems Review of Systems  Eyes: Negative for visual disturbance.  Musculoskeletal: Negative for gait problem.  Skin: Positive for wound.  Neurological: Negative for dizziness, syncope, weakness, light-headedness, numbness and headaches.  Hematological: Does not bruise/bleed easily.  Psychiatric/Behavioral: Negative for confusion.     Physical Exam Updated Vital Signs BP (!) 138/99 (BP Location: Left Arm)   Pulse 96   Temp 97.8 F (  36.6 C) (Oral)   Resp 18   Ht 5\' 3"  (1.6 m)   Wt 173 lb (78.5 kg)   SpO2 98%   BMI 30.65 kg/m   Physical Exam  Constitutional: She is oriented to person, place, and time. She appears well-developed and well-nourished.  HENT:  Head: Normocephalic and atraumatic.  Eyes: Conjunctivae are normal. Pupils are equal, round, and reactive to light. Right eye exhibits no discharge. Left eye exhibits no discharge. No scleral icterus.  Neck: Normal range of motion. No JVD present. No tracheal deviation present.  Pulmonary/Chest: Effort normal. No stridor.  Neurological: She is alert and oriented to  person, place, and time. She displays normal reflexes. No cranial nerve deficit or sensory deficit. She exhibits normal muscle tone. Coordination normal.  Cranial neves intact; upper and lower extremities intact;  coordination intact; no pronator drift  Skin: Skin is warm and dry.  3cm lineaer laceration .5 cm behind the hairline; hemostatic; no other injury  Psychiatric: She has a normal mood and affect. Her behavior is normal. Judgment and thought content normal.  Nursing note and vitals reviewed.    ED Treatments / Results  DIAGNOSTIC STUDIES: Oxygen Saturation is 98% on RA, normal by my interpretation.    COORDINATION OF CARE: 1:12 PM Discussed treatment plan with pt at bedside and pt agreed to plan. Pt prepared for laceration repair.  Labs (all labs ordered are listed, but only abnormal results are displayed) Labs Reviewed - No data to display  EKG  EKG Interpretation None       Radiology No results found.  Procedures Procedures (including critical care time)  Medications Ordered in ED Medications  lidocaine-EPINEPHrine-tetracaine (LET) solution (3 mLs Topical Given 01/04/17 1349)  ketorolac (TORADOL) injection 30 mg (30 mg Intramuscular Given 01/04/17 1349)  HYDROcodone-acetaminophen (NORCO/VICODIN) 5-325 MG per tablet 1 tablet (1 tablet Oral Given 01/04/17 1349)  Tdap (BOOSTRIX) injection 0.5 mL (0.5 mLs Intramuscular Given 01/04/17 1502)     Initial Impression / Assessment and Plan / ED Course  I have reviewed the triage vital signs and the nursing notes.  Pertinent labs & imaging results that were available during my care of the patient were reviewed by me and considered in my medical decision making (see chart for details).     Low suspicion for intracranial injuryl. Stapled as per PA note.  No indication for imaging.  Will return for wound check/staple removal.   Final Clinical Impressions(s) / ED Diagnoses   Final diagnoses:  Laceration of scalp, initial  encounter  Injury of head, initial encounter    New Prescriptions Discharge Medication List as of 01/04/2017  2:51 PM    START taking these medications   Details  HYDROcodone-acetaminophen (NORCO/VICODIN) 5-325 MG tablet Take 1 tablet by mouth every 6 (six) hours as needed for severe pain., Starting Thu 01/04/2017, Print       I personally performed the services described in this documentation, which was scribed in my presence. The recorded information has been reviewed and is accurate.     Merrily Pew, MD 01/07/17 [phone]

## 2017-01-04 NOTE — ED Provider Notes (Signed)
LACERATION REPAIR Performed by: Ocie Cornfield Authorized by: Ocie Cornfield Consent: Verbal consent obtained. Risks and benefits: risks, benefits and alternatives were discussed Consent given by: patient Patient identity confirmed: provided demographic data Prepped and Draped in normal sterile fashion Wound explored  Laceration Location: frontal head   Laceration Length: 3 cm  No Foreign Bodies seen or palpated  Anesthesia: topical  Local anesthetic: LET  Anesthetic total: topical  Irrigation method: syringe with sterile water Amount of cleaning: standard with   Skin closure: simple, staples  Number of staples: 7  Technique: staples  Patient tolerance: Patient tolerated the procedure well with no immediate complications.  Performed for Dr. Dayna Barker.   Heather Devoid, PA-C 01/04/17 1512    Merrily Pew, MD 01/04/17 [phone]

## 2017-01-12 ENCOUNTER — Ambulatory Visit
Admission: RE | Admit: 2017-01-12 | Discharge: 2017-01-12 | Disposition: A | Discharge status: Home / Self Care | Payer: Medicare Other | Source: Ambulatory Visit | Attending: Family Medicine | Admitting: Family Medicine

## 2017-01-12 DIAGNOSIS — Z1231 Encounter for screening mammogram for malignant neoplasm of breast: Secondary | ICD-10-CM

## 2017-04-05 ENCOUNTER — Other Ambulatory Visit: Payer: Self-pay | Admitting: Family Medicine

## 2017-04-05 DIAGNOSIS — R1310 Dysphagia, unspecified: Secondary | ICD-10-CM

## 2017-04-10 ENCOUNTER — Ambulatory Visit
Admission: RE | Admit: 2017-04-10 | Discharge: 2017-04-10 | Disposition: A | Discharge status: Home / Self Care | Payer: Medicare Other | Source: Ambulatory Visit | Attending: Family Medicine | Admitting: Family Medicine

## 2017-04-10 DIAGNOSIS — R1310 Dysphagia, unspecified: Secondary | ICD-10-CM

## 2017-05-09 ENCOUNTER — Other Ambulatory Visit: Payer: Self-pay | Admitting: Gastroenterology

## 2017-06-06 ENCOUNTER — Encounter (HOSPITAL_COMMUNITY): Admission: RE | Payer: Self-pay | Source: Ambulatory Visit

## 2017-06-06 ENCOUNTER — Ambulatory Visit (HOSPITAL_COMMUNITY): Admission: RE | Admit: 2017-06-06 | Payer: Medicare Other | Source: Ambulatory Visit | Admitting: Gastroenterology

## 2017-06-06 SURGERY — ESOPHAGOGASTRODUODENOSCOPY (EGD) WITH PROPOFOL
Anesthesia: Monitor Anesthesia Care

## 2017-10-17 DIAGNOSIS — E7800 Pure hypercholesterolemia, unspecified: Secondary | ICD-10-CM | POA: Diagnosis not present

## 2017-10-17 DIAGNOSIS — Z6833 Body mass index (BMI) 33.0-33.9, adult: Secondary | ICD-10-CM | POA: Diagnosis not present

## 2017-10-17 DIAGNOSIS — F419 Anxiety disorder, unspecified: Secondary | ICD-10-CM | POA: Diagnosis not present

## 2017-10-17 DIAGNOSIS — E559 Vitamin D deficiency, unspecified: Secondary | ICD-10-CM | POA: Diagnosis not present

## 2017-10-17 DIAGNOSIS — Z8551 Personal history of malignant neoplasm of bladder: Secondary | ICD-10-CM | POA: Diagnosis not present

## 2017-10-17 DIAGNOSIS — Z1159 Encounter for screening for other viral diseases: Secondary | ICD-10-CM | POA: Diagnosis not present

## 2017-10-17 DIAGNOSIS — Z0000 Encounter for general adult medical examination without abnormal findings: Secondary | ICD-10-CM | POA: Diagnosis not present

## 2017-10-17 DIAGNOSIS — I10 Essential (primary) hypertension: Secondary | ICD-10-CM | POA: Diagnosis not present

## 2017-10-17 DIAGNOSIS — K219 Gastro-esophageal reflux disease without esophagitis: Secondary | ICD-10-CM | POA: Diagnosis not present

## 2017-12-07 ENCOUNTER — Other Ambulatory Visit: Payer: Self-pay | Admitting: Family Medicine

## 2017-12-07 DIAGNOSIS — Z1231 Encounter for screening mammogram for malignant neoplasm of breast: Secondary | ICD-10-CM

## 2018-01-14 ENCOUNTER — Ambulatory Visit
Admission: RE | Admit: 2018-01-14 | Discharge: 2018-01-14 | Disposition: A | Discharge status: Home / Self Care | Payer: Medicare PPO | Source: Ambulatory Visit | Attending: Family Medicine | Admitting: Family Medicine

## 2018-01-14 DIAGNOSIS — Z1231 Encounter for screening mammogram for malignant neoplasm of breast: Secondary | ICD-10-CM

## 2018-04-17 DIAGNOSIS — N183 Chronic kidney disease, stage 3 (moderate): Secondary | ICD-10-CM | POA: Diagnosis not present

## 2018-04-17 DIAGNOSIS — I10 Essential (primary) hypertension: Secondary | ICD-10-CM | POA: Diagnosis not present

## 2018-04-17 DIAGNOSIS — F419 Anxiety disorder, unspecified: Secondary | ICD-10-CM | POA: Diagnosis not present

## 2018-04-17 DIAGNOSIS — E7800 Pure hypercholesterolemia, unspecified: Secondary | ICD-10-CM | POA: Diagnosis not present

## 2018-06-19 DIAGNOSIS — K219 Gastro-esophageal reflux disease without esophagitis: Secondary | ICD-10-CM | POA: Diagnosis not present

## 2018-07-12 DIAGNOSIS — N3000 Acute cystitis without hematuria: Secondary | ICD-10-CM | POA: Diagnosis not present

## 2018-07-31 DIAGNOSIS — C678 Malignant neoplasm of overlapping sites of bladder: Secondary | ICD-10-CM | POA: Diagnosis not present

## 2018-12-05 ENCOUNTER — Other Ambulatory Visit: Payer: Self-pay | Admitting: Family Medicine

## 2018-12-05 DIAGNOSIS — Z1231 Encounter for screening mammogram for malignant neoplasm of breast: Secondary | ICD-10-CM

## 2019-01-17 ENCOUNTER — Ambulatory Visit: Payer: Medicare PPO

## 2019-02-05 DIAGNOSIS — I10 Essential (primary) hypertension: Secondary | ICD-10-CM | POA: Diagnosis not present

## 2019-02-05 DIAGNOSIS — G4700 Insomnia, unspecified: Secondary | ICD-10-CM | POA: Diagnosis not present

## 2019-02-05 DIAGNOSIS — F419 Anxiety disorder, unspecified: Secondary | ICD-10-CM | POA: Diagnosis not present

## 2019-02-20 DIAGNOSIS — M542 Cervicalgia: Secondary | ICD-10-CM | POA: Diagnosis not present

## 2019-03-07 ENCOUNTER — Ambulatory Visit: Payer: Medicare PPO

## 2019-03-24 DIAGNOSIS — E559 Vitamin D deficiency, unspecified: Secondary | ICD-10-CM | POA: Diagnosis not present

## 2019-03-24 DIAGNOSIS — Z79899 Other long term (current) drug therapy: Secondary | ICD-10-CM | POA: Diagnosis not present

## 2019-03-24 DIAGNOSIS — E7800 Pure hypercholesterolemia, unspecified: Secondary | ICD-10-CM | POA: Diagnosis not present

## 2019-03-25 DIAGNOSIS — F419 Anxiety disorder, unspecified: Secondary | ICD-10-CM | POA: Diagnosis not present

## 2019-03-25 DIAGNOSIS — I10 Essential (primary) hypertension: Secondary | ICD-10-CM | POA: Diagnosis not present

## 2019-03-25 DIAGNOSIS — Z0001 Encounter for general adult medical examination with abnormal findings: Secondary | ICD-10-CM | POA: Diagnosis not present

## 2019-03-25 DIAGNOSIS — E559 Vitamin D deficiency, unspecified: Secondary | ICD-10-CM | POA: Diagnosis not present

## 2019-03-25 DIAGNOSIS — N183 Chronic kidney disease, stage 3 (moderate): Secondary | ICD-10-CM | POA: Diagnosis not present

## 2019-04-16 ENCOUNTER — Other Ambulatory Visit: Payer: Self-pay

## 2019-04-16 ENCOUNTER — Ambulatory Visit
Admission: RE | Admit: 2019-04-16 | Discharge: 2019-04-16 | Disposition: A | Discharge status: Home / Self Care | Payer: Medicare PPO | Source: Ambulatory Visit | Attending: Family Medicine | Admitting: Family Medicine

## 2019-04-16 DIAGNOSIS — Z1231 Encounter for screening mammogram for malignant neoplasm of breast: Secondary | ICD-10-CM | POA: Diagnosis not present

## 2019-06-10 DIAGNOSIS — N189 Chronic kidney disease, unspecified: Secondary | ICD-10-CM

## 2019-06-10 DIAGNOSIS — C652 Malignant neoplasm of left renal pelvis: Secondary | ICD-10-CM

## 2019-06-10 DIAGNOSIS — I5042 Chronic combined systolic (congestive) and diastolic (congestive) heart failure: Secondary | ICD-10-CM

## 2019-06-10 HISTORY — DX: Chronic kidney disease, unspecified: N18.9

## 2019-06-10 HISTORY — DX: Chronic combined systolic (congestive) and diastolic (congestive) heart failure: I50.42

## 2019-06-10 HISTORY — DX: Malignant neoplasm of left renal pelvis: C65.2

## 2019-06-24 ENCOUNTER — Other Ambulatory Visit: Payer: Self-pay

## 2019-06-24 ENCOUNTER — Inpatient Hospital Stay (HOSPITAL_COMMUNITY)
Admission: EM | Admit: 2019-06-24 | Discharge: 2019-07-02 | DRG: 656 | Disposition: A | Discharge status: Home / Self Care | Payer: Medicare PPO | Attending: Family Medicine | Admitting: Family Medicine

## 2019-06-24 ENCOUNTER — Encounter (HOSPITAL_COMMUNITY): Payer: Self-pay | Admitting: Emergency Medicine

## 2019-06-24 DIAGNOSIS — Z85528 Personal history of other malignant neoplasm of kidney: Secondary | ICD-10-CM

## 2019-06-24 DIAGNOSIS — I10 Essential (primary) hypertension: Secondary | ICD-10-CM | POA: Diagnosis not present

## 2019-06-24 DIAGNOSIS — R0600 Dyspnea, unspecified: Secondary | ICD-10-CM | POA: Diagnosis not present

## 2019-06-24 DIAGNOSIS — N179 Acute kidney failure, unspecified: Secondary | ICD-10-CM | POA: Diagnosis present

## 2019-06-24 DIAGNOSIS — K580 Irritable bowel syndrome with diarrhea: Secondary | ICD-10-CM | POA: Diagnosis present

## 2019-06-24 DIAGNOSIS — I130 Hypertensive heart and chronic kidney disease with heart failure and stage 1 through stage 4 chronic kidney disease, or unspecified chronic kidney disease: Secondary | ICD-10-CM | POA: Diagnosis present

## 2019-06-24 DIAGNOSIS — R0902 Hypoxemia: Secondary | ICD-10-CM | POA: Diagnosis not present

## 2019-06-24 DIAGNOSIS — R197 Diarrhea, unspecified: Secondary | ICD-10-CM | POA: Diagnosis present

## 2019-06-24 DIAGNOSIS — J9601 Acute respiratory failure with hypoxia: Secondary | ICD-10-CM

## 2019-06-24 DIAGNOSIS — E876 Hypokalemia: Secondary | ICD-10-CM | POA: Diagnosis present

## 2019-06-24 DIAGNOSIS — R778 Other specified abnormalities of plasma proteins: Secondary | ICD-10-CM

## 2019-06-24 DIAGNOSIS — E782 Mixed hyperlipidemia: Secondary | ICD-10-CM | POA: Diagnosis present

## 2019-06-24 DIAGNOSIS — Z20828 Contact with and (suspected) exposure to other viral communicable diseases: Secondary | ICD-10-CM | POA: Diagnosis present

## 2019-06-24 DIAGNOSIS — F329 Major depressive disorder, single episode, unspecified: Secondary | ICD-10-CM | POA: Diagnosis present

## 2019-06-24 DIAGNOSIS — R600 Localized edema: Secondary | ICD-10-CM | POA: Diagnosis present

## 2019-06-24 DIAGNOSIS — I160 Hypertensive urgency: Secondary | ICD-10-CM | POA: Diagnosis not present

## 2019-06-24 DIAGNOSIS — C662 Malignant neoplasm of left ureter: Secondary | ICD-10-CM | POA: Diagnosis present

## 2019-06-24 DIAGNOSIS — I5021 Acute systolic (congestive) heart failure: Secondary | ICD-10-CM | POA: Diagnosis not present

## 2019-06-24 DIAGNOSIS — E785 Hyperlipidemia, unspecified: Secondary | ICD-10-CM | POA: Diagnosis present

## 2019-06-24 DIAGNOSIS — F419 Anxiety disorder, unspecified: Secondary | ICD-10-CM | POA: Diagnosis present

## 2019-06-24 DIAGNOSIS — R7989 Other specified abnormal findings of blood chemistry: Secondary | ICD-10-CM

## 2019-06-24 DIAGNOSIS — Z87440 Personal history of urinary (tract) infections: Secondary | ICD-10-CM

## 2019-06-24 DIAGNOSIS — N135 Crossing vessel and stricture of ureter without hydronephrosis: Secondary | ICD-10-CM | POA: Diagnosis not present

## 2019-06-24 DIAGNOSIS — N1330 Unspecified hydronephrosis: Secondary | ICD-10-CM | POA: Diagnosis not present

## 2019-06-24 DIAGNOSIS — I5043 Acute on chronic combined systolic (congestive) and diastolic (congestive) heart failure: Secondary | ICD-10-CM | POA: Diagnosis not present

## 2019-06-24 DIAGNOSIS — I429 Cardiomyopathy, unspecified: Secondary | ICD-10-CM | POA: Diagnosis present

## 2019-06-24 DIAGNOSIS — D4122 Neoplasm of uncertain behavior of left ureter: Secondary | ICD-10-CM | POA: Diagnosis not present

## 2019-06-24 DIAGNOSIS — Z881 Allergy status to other antibiotic agents status: Secondary | ICD-10-CM | POA: Diagnosis not present

## 2019-06-24 DIAGNOSIS — I110 Hypertensive heart disease with heart failure: Secondary | ICD-10-CM | POA: Diagnosis not present

## 2019-06-24 DIAGNOSIS — I361 Nonrheumatic tricuspid (valve) insufficiency: Secondary | ICD-10-CM | POA: Diagnosis not present

## 2019-06-24 DIAGNOSIS — Z23 Encounter for immunization: Secondary | ICD-10-CM

## 2019-06-24 DIAGNOSIS — N131 Hydronephrosis with ureteral stricture, not elsewhere classified: Secondary | ICD-10-CM | POA: Diagnosis present

## 2019-06-24 DIAGNOSIS — N183 Chronic kidney disease, stage 3 (moderate): Secondary | ICD-10-CM | POA: Diagnosis present

## 2019-06-24 DIAGNOSIS — K5790 Diverticulosis of intestine, part unspecified, without perforation or abscess without bleeding: Secondary | ICD-10-CM | POA: Diagnosis not present

## 2019-06-24 DIAGNOSIS — Z833 Family history of diabetes mellitus: Secondary | ICD-10-CM | POA: Diagnosis not present

## 2019-06-24 DIAGNOSIS — Z905 Acquired absence of kidney: Secondary | ICD-10-CM | POA: Diagnosis not present

## 2019-06-24 DIAGNOSIS — R0602 Shortness of breath: Secondary | ICD-10-CM | POA: Diagnosis not present

## 2019-06-24 DIAGNOSIS — Z87891 Personal history of nicotine dependence: Secondary | ICD-10-CM | POA: Diagnosis not present

## 2019-06-24 DIAGNOSIS — Z79899 Other long term (current) drug therapy: Secondary | ICD-10-CM

## 2019-06-24 DIAGNOSIS — I371 Nonrheumatic pulmonary valve insufficiency: Secondary | ICD-10-CM | POA: Diagnosis not present

## 2019-06-24 DIAGNOSIS — I5041 Acute combined systolic (congestive) and diastolic (congestive) heart failure: Secondary | ICD-10-CM | POA: Diagnosis not present

## 2019-06-24 DIAGNOSIS — N201 Calculus of ureter: Secondary | ICD-10-CM | POA: Diagnosis not present

## 2019-06-24 DIAGNOSIS — Z03818 Encounter for observation for suspected exposure to other biological agents ruled out: Secondary | ICD-10-CM | POA: Diagnosis not present

## 2019-06-24 DIAGNOSIS — F32A Depression, unspecified: Secondary | ICD-10-CM | POA: Diagnosis present

## 2019-06-24 DIAGNOSIS — I509 Heart failure, unspecified: Secondary | ICD-10-CM | POA: Diagnosis not present

## 2019-06-24 LAB — CBC WITH DIFFERENTIAL/PLATELET
Abs Immature Granulocytes: 0.03 10*3/uL (ref 0.00–0.07)
Basophils Absolute: 0.1 10*3/uL (ref 0.0–0.1)
Basophils Relative: 1 %
Eosinophils Absolute: 0.3 10*3/uL (ref 0.0–0.5)
Eosinophils Relative: 3 %
HCT: 32.5 % — ABNORMAL LOW (ref 36.0–46.0)
Hemoglobin: 11.2 g/dL — ABNORMAL LOW (ref 12.0–15.0)
Immature Granulocytes: 0 %
Lymphocytes Relative: 24 %
Lymphs Abs: 2.2 10*3/uL (ref 0.7–4.0)
MCH: 31.5 pg (ref 26.0–34.0)
MCHC: 34.5 g/dL (ref 30.0–36.0)
MCV: 91.5 fL (ref 80.0–100.0)
Monocytes Absolute: 0.8 10*3/uL (ref 0.1–1.0)
Monocytes Relative: 9 %
Neutro Abs: 5.8 10*3/uL (ref 1.7–7.7)
Neutrophils Relative %: 63 %
Platelets: 223 10*3/uL (ref 150–400)
RBC: 3.55 MIL/uL — ABNORMAL LOW (ref 3.87–5.11)
RDW: 14 % (ref 11.5–15.5)
WBC: 9.2 10*3/uL (ref 4.0–10.5)
nRBC: 0 % (ref 0.0–0.2)

## 2019-06-24 LAB — MAGNESIUM: Magnesium: 1.2 mg/dL — ABNORMAL LOW (ref 1.7–2.4)

## 2019-06-24 LAB — COMPREHENSIVE METABOLIC PANEL
ALT: 17 U/L (ref 0–44)
AST: 27 U/L (ref 15–41)
Albumin: 3.7 g/dL (ref 3.5–5.0)
Alkaline Phosphatase: 50 U/L (ref 38–126)
Anion gap: 17 — ABNORMAL HIGH (ref 5–15)
BUN: 17 mg/dL (ref 8–23)
CO2: 32 mmol/L (ref 22–32)
Calcium: 7.5 mg/dL — ABNORMAL LOW (ref 8.9–10.3)
Chloride: 94 mmol/L — ABNORMAL LOW (ref 98–111)
Creatinine, Ser: 2.54 mg/dL — ABNORMAL HIGH (ref 0.44–1.00)
GFR calc Af Amer: 21 mL/min — ABNORMAL LOW (ref >60)
GFR calc non Af Amer: 18 mL/min — ABNORMAL LOW (ref >60)
Glucose, Bld: 157 mg/dL — ABNORMAL HIGH (ref 70–99)
Potassium: <2 mmol/L — CL (ref 3.5–5.1)
Sodium: 143 mmol/L (ref 135–145)
Total Bilirubin: 0.7 mg/dL (ref 0.3–1.2)
Total Protein: 6.7 g/dL (ref 6.5–8.1)

## 2019-06-24 NOTE — ED Triage Notes (Signed)
Pt to ED with c/o abnormal labs.  Pt st's she went to her MD ref. Swelling in her feet.  St's she was called and told to come to ED due to K+ being low and kidney function being low

## 2019-06-24 NOTE — ED Notes (Signed)
Potassium <2, charge [employer]

## 2019-06-25 ENCOUNTER — Encounter (HOSPITAL_COMMUNITY): Payer: Self-pay | Admitting: Internal Medicine

## 2019-06-25 ENCOUNTER — Observation Stay (HOSPITAL_COMMUNITY): Payer: Medicare PPO

## 2019-06-25 ENCOUNTER — Other Ambulatory Visit: Payer: Self-pay

## 2019-06-25 DIAGNOSIS — Z23 Encounter for immunization: Secondary | ICD-10-CM | POA: Diagnosis present

## 2019-06-25 DIAGNOSIS — K5790 Diverticulosis of intestine, part unspecified, without perforation or abscess without bleeding: Secondary | ICD-10-CM | POA: Diagnosis not present

## 2019-06-25 DIAGNOSIS — Z87891 Personal history of nicotine dependence: Secondary | ICD-10-CM | POA: Diagnosis not present

## 2019-06-25 DIAGNOSIS — I361 Nonrheumatic tricuspid (valve) insufficiency: Secondary | ICD-10-CM | POA: Diagnosis not present

## 2019-06-25 DIAGNOSIS — R0902 Hypoxemia: Secondary | ICD-10-CM | POA: Diagnosis not present

## 2019-06-25 DIAGNOSIS — Z87440 Personal history of urinary (tract) infections: Secondary | ICD-10-CM | POA: Diagnosis not present

## 2019-06-25 DIAGNOSIS — R197 Diarrhea, unspecified: Secondary | ICD-10-CM | POA: Diagnosis present

## 2019-06-25 DIAGNOSIS — I130 Hypertensive heart and chronic kidney disease with heart failure and stage 1 through stage 4 chronic kidney disease, or unspecified chronic kidney disease: Secondary | ICD-10-CM | POA: Diagnosis present

## 2019-06-25 DIAGNOSIS — I10 Essential (primary) hypertension: Secondary | ICD-10-CM | POA: Diagnosis not present

## 2019-06-25 DIAGNOSIS — F32A Depression, unspecified: Secondary | ICD-10-CM | POA: Diagnosis present

## 2019-06-25 DIAGNOSIS — N183 Chronic kidney disease, stage 3 (moderate): Secondary | ICD-10-CM | POA: Diagnosis present

## 2019-06-25 DIAGNOSIS — Z905 Acquired absence of kidney: Secondary | ICD-10-CM | POA: Diagnosis not present

## 2019-06-25 DIAGNOSIS — C662 Malignant neoplasm of left ureter: Secondary | ICD-10-CM | POA: Diagnosis present

## 2019-06-25 DIAGNOSIS — I5043 Acute on chronic combined systolic (congestive) and diastolic (congestive) heart failure: Secondary | ICD-10-CM | POA: Diagnosis not present

## 2019-06-25 DIAGNOSIS — K580 Irritable bowel syndrome with diarrhea: Secondary | ICD-10-CM | POA: Diagnosis present

## 2019-06-25 DIAGNOSIS — E785 Hyperlipidemia, unspecified: Secondary | ICD-10-CM | POA: Diagnosis present

## 2019-06-25 DIAGNOSIS — F329 Major depressive disorder, single episode, unspecified: Secondary | ICD-10-CM | POA: Diagnosis present

## 2019-06-25 DIAGNOSIS — I160 Hypertensive urgency: Secondary | ICD-10-CM | POA: Diagnosis not present

## 2019-06-25 DIAGNOSIS — Z85528 Personal history of other malignant neoplasm of kidney: Secondary | ICD-10-CM | POA: Diagnosis not present

## 2019-06-25 DIAGNOSIS — E876 Hypokalemia: Secondary | ICD-10-CM | POA: Diagnosis present

## 2019-06-25 DIAGNOSIS — E782 Mixed hyperlipidemia: Secondary | ICD-10-CM | POA: Diagnosis present

## 2019-06-25 DIAGNOSIS — I371 Nonrheumatic pulmonary valve insufficiency: Secondary | ICD-10-CM | POA: Diagnosis not present

## 2019-06-25 DIAGNOSIS — R600 Localized edema: Secondary | ICD-10-CM | POA: Diagnosis present

## 2019-06-25 DIAGNOSIS — R7989 Other specified abnormal findings of blood chemistry: Secondary | ICD-10-CM | POA: Diagnosis not present

## 2019-06-25 DIAGNOSIS — Z881 Allergy status to other antibiotic agents status: Secondary | ICD-10-CM | POA: Diagnosis not present

## 2019-06-25 DIAGNOSIS — J9601 Acute respiratory failure with hypoxia: Secondary | ICD-10-CM | POA: Diagnosis not present

## 2019-06-25 DIAGNOSIS — F419 Anxiety disorder, unspecified: Secondary | ICD-10-CM | POA: Diagnosis present

## 2019-06-25 DIAGNOSIS — N131 Hydronephrosis with ureteral stricture, not elsewhere classified: Secondary | ICD-10-CM | POA: Diagnosis present

## 2019-06-25 DIAGNOSIS — N1330 Unspecified hydronephrosis: Secondary | ICD-10-CM | POA: Diagnosis not present

## 2019-06-25 DIAGNOSIS — Z833 Family history of diabetes mellitus: Secondary | ICD-10-CM | POA: Diagnosis not present

## 2019-06-25 DIAGNOSIS — N179 Acute kidney failure, unspecified: Secondary | ICD-10-CM | POA: Diagnosis not present

## 2019-06-25 DIAGNOSIS — R0600 Dyspnea, unspecified: Secondary | ICD-10-CM | POA: Diagnosis not present

## 2019-06-25 DIAGNOSIS — I429 Cardiomyopathy, unspecified: Secondary | ICD-10-CM | POA: Diagnosis present

## 2019-06-25 DIAGNOSIS — I5021 Acute systolic (congestive) heart failure: Secondary | ICD-10-CM | POA: Diagnosis not present

## 2019-06-25 DIAGNOSIS — Z20828 Contact with and (suspected) exposure to other viral communicable diseases: Secondary | ICD-10-CM | POA: Diagnosis present

## 2019-06-25 DIAGNOSIS — I5041 Acute combined systolic (congestive) and diastolic (congestive) heart failure: Secondary | ICD-10-CM | POA: Diagnosis not present

## 2019-06-25 DIAGNOSIS — Z79899 Other long term (current) drug therapy: Secondary | ICD-10-CM | POA: Diagnosis not present

## 2019-06-25 LAB — CBC
HCT: 31.8 % — ABNORMAL LOW (ref 36.0–46.0)
Hemoglobin: 11 g/dL — ABNORMAL LOW (ref 12.0–15.0)
MCH: 31.5 pg (ref 26.0–34.0)
MCHC: 34.6 g/dL (ref 30.0–36.0)
MCV: 91.1 fL (ref 80.0–100.0)
Platelets: 214 10*3/uL (ref 150–400)
RBC: 3.49 MIL/uL — ABNORMAL LOW (ref 3.87–5.11)
RDW: 14.2 % (ref 11.5–15.5)
WBC: 9.8 10*3/uL (ref 4.0–10.5)
nRBC: 0 % (ref 0.0–0.2)

## 2019-06-25 LAB — BASIC METABOLIC PANEL
Anion gap: 14 (ref 5–15)
Anion gap: 15 (ref 5–15)
Anion gap: 12 (ref 5–15)
BUN: 18 mg/dL (ref 8–23)
BUN: 17 mg/dL (ref 8–23)
BUN: 17 mg/dL (ref 8–23)
CO2: 33 mmol/L — ABNORMAL HIGH (ref 22–32)
CO2: 30 mmol/L (ref 22–32)
CO2: 30 mmol/L (ref 22–32)
Calcium: 7.5 mg/dL — ABNORMAL LOW (ref 8.9–10.3)
Calcium: 7.8 mg/dL — ABNORMAL LOW (ref 8.9–10.3)
Calcium: 7.6 mg/dL — ABNORMAL LOW (ref 8.9–10.3)
Chloride: 97 mmol/L — ABNORMAL LOW (ref 98–111)
Chloride: 100 mmol/L (ref 98–111)
Chloride: 102 mmol/L (ref 98–111)
Creatinine, Ser: 2.41 mg/dL — ABNORMAL HIGH (ref 0.44–1.00)
Creatinine, Ser: 2.27 mg/dL — ABNORMAL HIGH (ref 0.44–1.00)
Creatinine, Ser: 2.37 mg/dL — ABNORMAL HIGH (ref 0.44–1.00)
GFR calc Af Amer: 23 mL/min — ABNORMAL LOW (ref >60)
GFR calc Af Amer: 24 mL/min — ABNORMAL LOW (ref >60)
GFR calc Af Amer: 23 mL/min — ABNORMAL LOW (ref >60)
GFR calc non Af Amer: 20 mL/min — ABNORMAL LOW (ref >60)
GFR calc non Af Amer: 21 mL/min — ABNORMAL LOW (ref >60)
GFR calc non Af Amer: 20 mL/min — ABNORMAL LOW (ref >60)
Glucose, Bld: 116 mg/dL — ABNORMAL HIGH (ref 70–99)
Glucose, Bld: 120 mg/dL — ABNORMAL HIGH (ref 70–99)
Glucose, Bld: 111 mg/dL — ABNORMAL HIGH (ref 70–99)
Potassium: <2 mmol/L — CL (ref 3.5–5.1)
Potassium: 2.3 mmol/L — CL (ref 3.5–5.1)
Potassium: 2.4 mmol/L — CL (ref 3.5–5.1)
Sodium: 144 mmol/L (ref 135–145)
Sodium: 145 mmol/L (ref 135–145)
Sodium: 144 mmol/L (ref 135–145)

## 2019-06-25 LAB — URINALYSIS, ROUTINE W REFLEX MICROSCOPIC
Bilirubin Urine: NEGATIVE
Glucose, UA: NEGATIVE mg/dL
Ketones, ur: NEGATIVE mg/dL
Nitrite: NEGATIVE
Protein, ur: NEGATIVE mg/dL
Specific Gravity, Urine: 1.005 (ref 1.005–1.030)
pH: 7 (ref 5.0–8.0)

## 2019-06-25 LAB — CREATININE, URINE, RANDOM: Creatinine, Urine: 47.34 mg/dL

## 2019-06-25 LAB — SARS CORONAVIRUS 2 (TAT 6-24 HRS): SARS Coronavirus 2: NEGATIVE

## 2019-06-25 LAB — MAGNESIUM: Magnesium: 2.7 mg/dL — ABNORMAL HIGH (ref 1.7–2.4)

## 2019-06-25 LAB — BRAIN NATRIURETIC PEPTIDE: B Natriuretic Peptide: 159.7 pg/mL — ABNORMAL HIGH (ref 0.0–100.0)

## 2019-06-25 MED ORDER — HEPARIN SODIUM (PORCINE) 5000 UNIT/ML IJ SOLN
5000.0000 [IU] | Freq: Three times a day (TID) | INTRAMUSCULAR | Status: DC
  Administered 2019-06-25 – 2019-06-26 (×4): 5000 [IU] via SUBCUTANEOUS
  Filled 2019-06-25 (×4): qty 1

## 2019-06-25 MED ORDER — POTASSIUM CHLORIDE CRYS ER 20 MEQ PO TBCR
40.0000 meq | EXTENDED_RELEASE_TABLET | Freq: Once | ORAL | Status: AC
  Administered 2019-06-25: 40 meq via ORAL
  Filled 2019-06-25: qty 2

## 2019-06-25 MED ORDER — POTASSIUM CHLORIDE CRYS ER 20 MEQ PO TBCR
40.0000 meq | EXTENDED_RELEASE_TABLET | Freq: Two times a day (BID) | ORAL | Status: AC
  Administered 2019-06-26 (×2): 40 meq via ORAL
  Filled 2019-06-25 (×3): qty 2

## 2019-06-25 MED ORDER — TRAZODONE HCL 100 MG PO TABS
100.0000 mg | ORAL_TABLET | Freq: Every day | ORAL | Status: DC
  Administered 2019-06-25 – 2019-07-01 (×7): 100 mg via ORAL
  Filled 2019-06-25 (×7): qty 1

## 2019-06-25 MED ORDER — ONDANSETRON HCL 4 MG/2ML IJ SOLN: 4.0000 mg | Freq: Four times a day (QID) | INTRAMUSCULAR | Status: DC | PRN

## 2019-06-25 MED ORDER — SERTRALINE HCL 50 MG PO TABS
50.0000 mg | ORAL_TABLET | Freq: Every day | ORAL | Status: DC
  Administered 2019-06-25 – 2019-07-02 (×8): 50 mg via ORAL
  Filled 2019-06-25 (×9): qty 1

## 2019-06-25 MED ORDER — CALCIUM CARBONATE 200 MG PO CAPS: 250.0000 mg | ORAL_CAPSULE | Freq: Every day | ORAL | Status: DC

## 2019-06-25 MED ORDER — POTASSIUM CHLORIDE CRYS ER 20 MEQ PO TBCR
40.0000 meq | EXTENDED_RELEASE_TABLET | Freq: Two times a day (BID) | ORAL | Status: DC
  Administered 2019-06-25: 40 meq via ORAL

## 2019-06-25 MED ORDER — ACETAMINOPHEN 650 MG RE SUPP: 650.0000 mg | Freq: Four times a day (QID) | RECTAL | Status: DC | PRN

## 2019-06-25 MED ORDER — MAGNESIUM SULFATE 4 GM/100ML IV SOLN
4.0000 g | Freq: Once | INTRAVENOUS | Status: AC
  Administered 2019-06-25: 4 g via INTRAVENOUS
  Filled 2019-06-25: qty 100

## 2019-06-25 MED ORDER — AMLODIPINE BESYLATE 5 MG PO TABS
5.0000 mg | ORAL_TABLET | Freq: Every day | ORAL | Status: DC
  Administered 2019-06-25: 5 mg via ORAL
  Filled 2019-06-25 (×2): qty 1

## 2019-06-25 MED ORDER — POTASSIUM CHLORIDE 10 MEQ/100ML IV SOLN
10.0000 meq | INTRAVENOUS | Status: AC
  Administered 2019-06-25 – 2019-06-26 (×6): 10 meq via INTRAVENOUS
  Filled 2019-06-25 (×6): qty 100

## 2019-06-25 MED ORDER — ONDANSETRON HCL 4 MG PO TABS: 4.0000 mg | ORAL_TABLET | Freq: Four times a day (QID) | ORAL | Status: DC | PRN

## 2019-06-25 MED ORDER — POTASSIUM CHLORIDE CRYS ER 20 MEQ PO TBCR
40.0000 meq | EXTENDED_RELEASE_TABLET | ORAL | Status: AC
  Administered 2019-06-25: 40 meq via ORAL
  Filled 2019-06-25 (×2): qty 2

## 2019-06-25 MED ORDER — POTASSIUM CHLORIDE 10 MEQ/100ML IV SOLN
10.0000 meq | INTRAVENOUS | Status: AC
  Administered 2019-06-25 (×4): 10 meq via INTRAVENOUS
  Filled 2019-06-25 (×4): qty 100

## 2019-06-25 MED ORDER — POTASSIUM CHLORIDE 10 MEQ/100ML IV SOLN
10.0000 meq | INTRAVENOUS | Status: AC
  Administered 2019-06-25 (×4): 10 meq via INTRAVENOUS
  Filled 2019-06-25 (×4): qty 100

## 2019-06-25 MED ORDER — SIMVASTATIN 20 MG PO TABS
20.0000 mg | ORAL_TABLET | Freq: Every day | ORAL | Status: DC
  Administered 2019-06-25 – 2019-07-02 (×8): 20 mg via ORAL
  Filled 2019-06-25 (×8): qty 1

## 2019-06-25 MED ORDER — VITAMIN D 25 MCG (1000 UNIT) PO TABS
1000.0000 [IU] | ORAL_TABLET | Freq: Every day | ORAL | Status: DC
  Administered 2019-06-25 – 2019-07-02 (×8): 1000 [IU] via ORAL
  Filled 2019-06-25 (×8): qty 1

## 2019-06-25 MED ORDER — ACETAMINOPHEN 325 MG PO TABS
650.0000 mg | ORAL_TABLET | Freq: Four times a day (QID) | ORAL | Status: DC | PRN
  Administered 2019-06-25 – 2019-07-02 (×5): 650 mg via ORAL
  Filled 2019-06-25 (×5): qty 2

## 2019-06-25 MED ORDER — HYDRALAZINE HCL 20 MG/ML IJ SOLN
5.0000 mg | INTRAMUSCULAR | Status: DC | PRN
  Administered 2019-06-25 – 2019-06-26 (×4): 5 mg via INTRAVENOUS
  Filled 2019-06-25 (×4): qty 1

## 2019-06-25 MED ORDER — CALCIUM CARBONATE ANTACID 500 MG PO CHEW
1.0000 | CHEWABLE_TABLET | Freq: Every day | ORAL | Status: DC
  Administered 2019-06-25 – 2019-07-02 (×8): 200 mg via ORAL
  Filled 2019-06-25 (×8): qty 1

## 2019-06-25 MED ORDER — MAGNESIUM OXIDE 400 (241.3 MG) MG PO TABS
800.0000 mg | ORAL_TABLET | Freq: Once | ORAL | Status: AC
  Administered 2019-06-25: 03:00:00 800 mg via ORAL
  Filled 2019-06-25: qty 2

## 2019-06-25 MED ORDER — POTASSIUM CHLORIDE 10 MEQ/100ML IV SOLN
10.0000 meq | INTRAVENOUS | Status: AC
  Administered 2019-06-25 (×4): 10 meq via INTRAVENOUS
  Filled 2019-06-25 (×4): qty 100

## 2019-06-25 MED ORDER — SODIUM CHLORIDE 0.9 % IV SOLN
INTRAVENOUS | Status: DC
  Administered 2019-06-25 – 2019-06-26 (×2): via INTRAVENOUS

## 2019-06-25 NOTE — ED Notes (Signed)
Admitting provider contacted in regards to [facility] requesting CT stone protocol results before they consult, as well as the critical potassium of Potassium <2.

## 2019-06-25 NOTE — Progress Notes (Signed)
Pt. Potassium 2.4. On call for [facility] paged to make aware.

## 2019-06-25 NOTE — ED Notes (Signed)
Unable to give urine specimen at this time .  

## 2019-06-25 NOTE — H&P (Addendum)
History and Physical    [name] [id number] DOB: [birth year] DOA: 06/24/2019  Referring MD/NP/PA:   PCP: Darcus Austin, MD (Inactive)   Patient coming from:  The patient is coming from home.  At baseline, pt is independent for most of ADL.        Chief Complaint: Abnormal lab  HPI: [name] is a 73 y.o. female with medical history significant of hypertension, hyperlipidemia, kidney cancer (s/p of right nephrectomy), depression, possible IBS, who presents with abnormal lab  Pt states that she she noticed bilateral leg edema which has been going on for 3 weeks.  She went to see her primary care doctor yesterday, and had some lab done in the office.  She was called back today and was told that she has low potassium and worsening renal function.  She was sent to ED for further evaluation treatment.  Patient states that she had 5-6 times of loose stool bowel movement yesterday, which has stopped today.  She also had mild abdominal pain yesterday, which has resolved.  No nausea or vomiting.  She attributes her diarrhea and abdominal pain to IBS.  Denies fever or chills.  Patient does not have chest pain, shortness of breath, cough.  No symptoms of UTI. She states that she has bilateral lower leg edema.  ED Course: pt was found to have potassium less than 2, WBC 9.2, pending COVID-19 test, pending UA, AKI with creatinine 2.54, BUN 17 (no baseline creatinine available), temperature normal, blood pressure 193/82, heart rate 80, oxygen sat 92 to 97% on room air.  Patient is placed on telemetry bed for observation.  Review of Systems:   General: no fevers, chills, no body weight gain, no fatigue HEENT: no blurry vision, hearing changes or sore throat Respiratory: no dyspnea, coughing, wheezing CV: no chest pain, no palpitations GI: no nausea, vomiting, has abdominal pain, diarrhea, no constipation GU: no dysuria, burning on urination, increased urinary frequency, hematuria  Ext:  has leg edema Neuro: no unilateral weakness, numbness, or tingling, no vision change or hearing loss Skin: no rash, no skin tear. MSK: No muscle spasm, no deformity, no limitation of range of movement in spin Heme: No easy bruising.  Travel history: No recent long distant travel.  Allergy:  Allergies  Allergen Reactions  . Erythromycin Diarrhea and Nausea And Vomiting  . Other     Unknown blood pressure pill caused severe coughing    Past Medical History:  Diagnosis Date  . Anxiety   . Cancer ([place])   . Depression   . History of kidney cancer   . Hyperlipidemia   . Hypertension     Past Surgical History:  Procedure Laterality Date  . bladder-partial removal    . kidney removed      Social History:  reports that she quit smoking about 15 years ago. Her smoking use included cigarettes. She has a 19.00 pack-year smoking history. She has never used smokeless tobacco. She reports that she does not drink alcohol or use drugs.  Family History:  Family History  Problem Relation Age of Onset  . Hypertension Mother   . Diabetes Mellitus II Sister   . Hypertension Sister   . Hypertension Brother      Prior to Admission medications   Medication Sig Start Date End Date Taking? Authorizing Provider  calcium carbonate 200 MG capsule Take 250 mg by mouth 2 (two) times daily with a meal.    [provider]  cephALEXin (KEFLEX) 500  MG capsule Take 1 capsule (500 mg total) by mouth 3 (three) times daily. 12/05/13   [name], DPM  cholecalciferol (VITAMIN D) 1000 UNITS tablet Take 1,000 Units by mouth daily.    [provider]  HYDROCHLOROTHIAZIDE PO Take by mouth.    [provider]  HYDROcodone-acetaminophen (NORCO/VICODIN) 5-325 MG tablet Take 1 tablet by mouth every 6 (six) hours as needed for severe pain. 01/04/17   Mesner, Corene Cornea, MD  sertraline (ZOLOFT) 50 MG tablet Take 50 mg by mouth daily.    [provider]  simvastatin (ZOCOR) 20 MG  tablet Take 20 mg by mouth daily.    [provider]  Tavaborole (KERYDIN) 5 % SOLN Apply 1 drop each affected nail once daily for 12 months duration 04/15/14   [name], DPM  TRAZODONE HCL PO Take by mouth.    [provider]    Physical Exam: Vitals:   06/25/19 0415 06/25/19 0430 06/25/19 0545 06/25/19 0620  BP: (!) 168/98 (!) 190/98 (!) 197/83 (!) 195/84  Pulse: 78 78 76   Resp: (!) 27 20 20    Temp:      TempSrc:      SpO2: 92% 97% (!) 87%   Weight:      Height:       General: Not in acute distress HEENT:       Eyes: PERRL, EOMI, no scleral icterus.       ENT: No discharge from the ears and nose, no pharynx injection, no tonsillar enlargement.        Neck: No JVD, no bruit, no mass felt. Heme: No neck lymph node enlargement. Cardiac: S1/S2, RRR, No murmurs, No gallops or rubs. Respiratory: No rales, wheezing, rhonchi or rubs. GI: Soft, nondistended, nontender, no rebound pain, no organomegaly, BS present. GU: No hematuria Ext: has trace leg edema bilaterally. 2+DP/PT pulse bilaterally. Musculoskeletal: No joint deformities, No joint redness or warmth, no limitation of ROM in spin. Skin: No rashes.  Neuro: Alert, oriented X3, cranial nerves II-XII grossly intact, moves all extremities normally. Psych: Patient is not psychotic, no suicidal or hemocidal ideation.  Labs on Admission: I have personally reviewed following labs and imaging studies  CBC: Recent Labs  Lab 06/24/19 1848  WBC 9.2  NEUTROABS 5.8  HGB 11.2*  HCT 32.5*  MCV 91.5  PLT [id number]   Basic Metabolic Panel: Recent Labs  Lab 06/24/19 1848 06/24/19 2312  NA 143  --   K <2.0*  --   CL 94*  --   CO2 32  --   GLUCOSE 157*  --   BUN 17  --   CREATININE 2.54*  --   CALCIUM 7.5*  --   MG  --  1.2*   GFR: Estimated Creatinine Clearance: 21 mL/min (A) (by C-G formula based on SCr of 2.54 mg/dL (H)). Liver Function Tests: Recent Labs  Lab 06/24/19 1848  AST 27  ALT 17   ALKPHOS 50  BILITOT 0.7  PROT 6.7  ALBUMIN 3.7   No results for input(s): LIPASE, AMYLASE in the last 168 hours. No results for input(s): AMMONIA in the last 168 hours. Coagulation Profile: No results for input(s): INR, PROTIME in the last 168 hours. Cardiac Enzymes: No results for input(s): CKTOTAL, CKMB, CKMBINDEX, TROPONINI in the last 168 hours. BNP (last 3 results) No results for input(s): PROBNP in the last 8760 hours. HbA1C: No results for input(s): HGBA1C in the last 72 hours. CBG: No results for input(s): GLUCAP in the  last 168 hours. Lipid Profile: No results for input(s): CHOL, HDL, LDLCALC, TRIG, CHOLHDL, LDLDIRECT in the last 72 hours. Thyroid Function Tests: No results for input(s): TSH, T4TOTAL, FREET4, T3FREE, THYROIDAB in the last 72 hours. Anemia Panel: No results for input(s): VITAMINB12, FOLATE, FERRITIN, TIBC, IRON, RETICCTPCT in the last 72 hours. Urine analysis: No results found for: COLORURINE, APPEARANCEUR, LABSPEC, PHURINE, GLUCOSEU, HGBUR, BILIRUBINUR, KETONESUR, PROTEINUR, UROBILINOGEN, NITRITE, LEUKOCYTESUR Sepsis Labs: @LABRCNTIP (procalcitonin:4,lacticidven:4) )No results found for this or any previous visit (from the past 240 hour(s)).   Radiological Exams on Admission: US Renal  Result Date: 06/25/2019 CLINICAL DATA:  Acute kidney injury EXAM: RENAL / URINARY TRACT ULTRASOUND COMPLETE COMPARISON:  05/10/2012 FINDINGS: Right Kidney: History of right nephrectomy.  No findings in the renal fossa Left Kidney: Renal measurements: 13 x 6 x 7 cm = volume: 300 mL. Mild-to-moderate hydronephrosis without visible obstructive process. Negative for collection. Bladder: Appears normal for degree of bladder distention. Inspection of bladder jet not noted. IMPRESSION: Hydronephrosis of the solitary left kidney. Electronically Signed   By: Monte Fantasia M.D.   On: 06/25/2019 05:33     EKG: Independently reviewed.  Sinus rhythm, QTC 450, low voltage, nonspecific  T wave change.  Assessment/Plan Principal Problem:   AKI (acute kidney injury) ([place]) Active Problems:   Hypertension   Hyperlipidemia   Depression   Hypokalemia   Hypomagnesemia   Bilateral leg edema   Diarrhea   AKI (acute kidney injury) ([place]): Creatinine 2.54, BUN 17.  No baseline creatinine available in epic. Likely due to dehydration 2/2 diarrhea and continuation of diuretics.   - place on tele bed for obs - IVF: 125 cc/h of NS - Follow up renal function by BMP - Avoid using renal toxic medications, hypotension and contrast dye  - Check FeUrea - Hold HCTZ - US-renal  Addendum: US showed hydronephrosis of the solitary left kidney. I called Dr. Wilder Glade of Urology at 6:59 AM.  She will look pt's Chart for further decision She would like to be called back using her cell phone which I listed under [place] communication.  Hypertension: Blood pressure 193/82.  -hold HCTZ -Start amlodipine 5 mg daily -Hydralazine as needed  Hyperlipidemia: -zocor  Depression: -Zoloft  Hypokalemia and Hypomagnesemia: K<2.0 and Mg 1.2 - will give 10 mEq of KCl x 4 by IV, and 40 mEq x 3 orally - will give 4 g of magnesium sulfate  Diarrhea: Patient states that her diarrhea has stopped currently.  She attributes this to her chronic IBS issue. -Observe closely  Bilateral lower leg edema: Patient has trace leg edema on examination.  Possibly due to worsening renal function -Check BMP     DVT ppx: SQ Heparin     Code Status: Full code Family Communication: None at bed side.  Disposition Plan:  Anticipate discharge back to previous home environment Consults called:  none Admission status: Obs / tele    Date of Service 06/25/2019    [facility]   If 7PM-7AM, please contact night-coverage [email or web address] Password [facility] 06/25/2019, 6:23 AM

## 2019-06-25 NOTE — ED Provider Notes (Signed)
[facility] DEPART[facility] Provider Note   CSN: [id number] Arrival date & time: 06/24/19  1831     History   Chief Complaint Chief Complaint  Patient presents with  . Abnormal Lab    HPI [name] is a 73 y.o. female.     73 yo F with a cc of lower extremity edema.  Going on for about 3 weeks.  Went and saw her family doctor yesterday and was called back and told that her renal function had gotten much worse than her baseline and she was sent to the ED for admission.  Patient denies any recent dietary changes.  She has 1 kidney secondary to having cancer in the past.  She denies any infectious symptoms denies cough congestion or fever denies vomiting.  She had one loose stool earlier today.  Denies urinary symptoms.  Denies difficulty with urination.  Denies abdominal pain. Denies sob or chest pain. Denies fluid pill.   The history is provided by the patient.  Abnormal Lab Illness Severity:  Moderate Onset quality:  Gradual Duration:  3 weeks Timing:  Constant Progression:  Worsening Chronicity:  New Associated symptoms: no chest pain, no congestion, no fever, no headaches, no myalgias, no nausea, no rhinorrhea, no shortness of breath, no vomiting and no wheezing     Past Medical History:  Diagnosis Date  . Anxiety   . Cancer ([place])   . Depression   . History of kidney cancer   . Hyperlipidemia   . Hypertension     Patient Active Problem List   Diagnosis Date Noted  . Hypertension   . Hyperlipidemia   . Depression   . Hypokalemia   . Hypomagnesemia   . Bilateral leg edema   . AKI (acute kidney injury) [facility])     Past Surgical History:  Procedure Laterality Date  . bladder-partial removal    . kidney removed       OB History   No obstetric history on file.      Home Medications    Prior to Admission medications   Medication Sig Start Date End Date Taking? Authorizing Provider  calcium carbonate 200 MG capsule Take 250  mg by mouth 2 (two) times daily with a meal.    [provider]  cephALEXin (KEFLEX) 500 MG capsule Take 1 capsule (500 mg total) by mouth 3 (three) times daily. 12/05/13   [name], DPM  cholecalciferol (VITAMIN D) 1000 UNITS tablet Take 1,000 Units by mouth daily.    [provider]  HYDROCHLOROTHIAZIDE PO Take by mouth.    [provider]  HYDROcodone-acetaminophen (NORCO/VICODIN) 5-325 MG tablet Take 1 tablet by mouth every 6 (six) hours as needed for severe pain. 01/04/17   Mesner, Corene Cornea, MD  sertraline (ZOLOFT) 50 MG tablet Take 50 mg by mouth daily.    [provider]  simvastatin (ZOCOR) 20 MG tablet Take 20 mg by mouth daily.    [provider]  Tavaborole (KERYDIN) 5 % SOLN Apply 1 drop each affected nail once daily for 12 months duration 04/15/14   [name], DPM  TRAZODONE HCL PO Take by mouth.    [provider]    Family History No family history on file.  Social History Social History   Tobacco Use  . Smoking status: Former Smoker    Packs/day: 0.50    Years: 38.00    Pack years: 19.00    Types: Cigarettes    Quit date: 11/26/2003  Years since quitting: 15.5  . Smokeless tobacco: Never Used  Substance Use Topics  . Alcohol use: No    Alcohol/week: 0.0 standard drinks  . Drug use: No     Allergies   Erythromycin and Other   Review of Systems Review of Systems  Constitutional: Negative for chills and fever.  HENT: Negative for congestion and rhinorrhea.   Eyes: Negative for redness and visual disturbance.  Respiratory: Negative for shortness of breath and wheezing.   Cardiovascular: Positive for leg swelling. Negative for chest pain and palpitations.  Gastrointestinal: Negative for nausea and vomiting.  Genitourinary: Negative for dysuria and urgency.  Musculoskeletal: Negative for arthralgias and myalgias.  Skin: Negative for pallor and wound.  Neurological: Negative for dizziness and  headaches.     Physical Exam Updated Vital Signs BP (!) 193/82 (BP Location: Right Arm)   Pulse 80   Temp 98.4 F (36.9 C) (Oral)   Resp (!) 24   Ht 5\' 3"  (1.6 m)   Wt 85.3 kg   SpO2 93%   BMI 33.30 kg/m   Physical Exam Vitals signs and nursing note reviewed.  Constitutional:      General: She is not in acute distress.    Appearance: She is well-developed. She is not diaphoretic.  HENT:     Head: Normocephalic and atraumatic.  Eyes:     Pupils: Pupils are equal, round, and reactive to light.  Neck:     Musculoskeletal: Normal range of motion and neck supple.  Cardiovascular:     Rate and Rhythm: Normal rate and regular rhythm.     Heart sounds: No murmur. No friction rub. No gallop.   Pulmonary:     Effort: Pulmonary effort is normal.     Breath sounds: No wheezing or rales.  Abdominal:     General: There is no distension.     Palpations: Abdomen is soft.     Tenderness: There is no abdominal tenderness.  Musculoskeletal:        General: No tenderness.     Comments: Trace edema to the bilateral lower extremities  Skin:    General: Skin is warm and dry.  Neurological:     Mental Status: She is alert and oriented to person, place, and time.  Psychiatric:        Behavior: Behavior normal.      ED Treatments / Results  Labs (all labs ordered are listed, but only abnormal results are displayed) Labs Reviewed  CBC WITH DIFFERENTIAL/PLATELET - Abnormal; Notable for the following components:      Result Value   RBC 3.55 (*)    Hemoglobin 11.2 (*)    HCT 32.5 (*)    All other components within normal limits  COMPREHENSIVE METABOLIC PANEL - Abnormal; Notable for the following components:   Potassium <2.0 (*)    Chloride 94 (*)    Glucose, Bld 157 (*)    Creatinine, Ser 2.54 (*)    Calcium 7.5 (*)    GFR calc non Af Amer 18 (*)    GFR calc Af Amer 21 (*)    Anion gap 17 (*)    All other components within normal limits  MAGNESIUM - Abnormal; Notable for the  following components:   Magnesium 1.2 (*)    All other components within normal limits  SARS CORONAVIRUS 2 (TAT 6-24 HRS)  SODIUM, URINE, RANDOM  CREATININE, URINE, RANDOM  URINALYSIS, ROUTINE W REFLEX MICROSCOPIC    EKG EKG Interpretation  Date/Time:  Tuesday June 24 2019 18:36:14 EDT Ventricular Rate:  77 PR Interval:  160 QRS Duration: 96 QT Interval:  398 QTC Calculation: 450 R Axis:   72 Text Interpretation:  Normal sinus rhythm ST & T wave abnormality, consider inferolateral ischemia Abnormal ECG ?u waves Otherwise no significant change Confirmed by Deno Etienne [phone]) on 06/25/2019 2:42:30 AM   Radiology No results found.  Procedures Procedures (including critical care time)  Medications Ordered in ED Medications  potassium chloride 10 mEq in 100 mL IVPB (10 mEq Intravenous New Bag/Given 06/25/19 0320)  potassium chloride SA (K-DUR) CR tablet 40 mEq (has no administration in time range)  magnesium sulfate IVPB 4 g 100 mL (has no administration in time range)  0.9 %  sodium chloride infusion (has no administration in time range)  magnesium oxide (MAG-OX) tablet 800 mg (800 mg Oral Given 06/25/19 0316)  potassium chloride SA (K-DUR) CR tablet 40 mEq (40 mEq Oral Given 06/25/19 0315)     Initial Impression / Assessment and Plan / ED Course  I have reviewed the triage vital signs and the nursing notes.  Pertinent labs & imaging results that were available during my care of the patient were reviewed by me and considered in my medical decision making (see chart for details).        73 yo F with a chief complaints of bilateral lower extremity edema.  Going on for about 3 weeks now.  Went to see her family doctor and had labs drawn.  Call back today with concern for new renal dysfunction, hypo-kalemia hypomagnesemia.  She is also noted to be hypertensive in the doctor's office.  CRITICAL CARE Performed by: Cecilio Asper   Total critical care time: 35  minutes  Critical care time was exclusive of separately billable procedures and treating other patients.  Critical care was necessary to treat or prevent imminent or life-threatening deterioration.  Critical care was time spent personally by me on the following activities: development of treatment plan with patient and/or surrogate as well as nursing, discussions with consultants, evaluation of patient's response to treatment, examination of patient, obtaining history from patient or surrogate, ordering and performing treatments and interventions, ordering and review of laboratory studies, ordering and review of radiographic studies, pulse oximetry and re-evaluation of patient's condition.  The patients results and plan were reviewed and discussed.   Any x-rays performed were independently reviewed by myself.   Differential diagnosis were considered with the presenting HPI.  Medications  potassium chloride 10 mEq in 100 mL IVPB (10 mEq Intravenous New Bag/Given 06/25/19 0320)  potassium chloride SA (K-DUR) CR tablet 40 mEq (has no administration in time range)  magnesium sulfate IVPB 4 g 100 mL (has no administration in time range)  0.9 %  sodium chloride infusion (has no administration in time range)  magnesium oxide (MAG-OX) tablet 800 mg (800 mg Oral Given 06/25/19 0316)  potassium chloride SA (K-DUR) CR tablet 40 mEq (40 mEq Oral Given 06/25/19 0315)    Vitals:   06/25/19 0011 06/25/19 0235 06/25/19 0310 06/25/19 0335  BP: (!) 202/71 (!) 199/134 (!) 207/81 (!) 193/82  Pulse: 71 70 85 80  Resp: 18 16 16  (!) 24  Temp: 98.7 F (37.1 C) 98.4 F (36.9 C)    TempSrc: Oral Oral    SpO2: 92% 94% 95% 93%  Weight:      Height:        Final diagnoses:  Hypokalemia  Lower extremity edema    Admission/  observation were discussed with the admitting physician, patient and/or family and they are comfortable with the plan.   Final Clinical Impressions(s) / ED Diagnoses   Final diagnoses:   Hypokalemia  Lower extremity edema    ED Discharge Orders    None       Deno Etienne, DO 06/25/19 [phone]

## 2019-06-25 NOTE — Progress Notes (Signed)
Pt complained of SOB, RN checked O2 SAT pt was satting 91-92 on RA. Notified MD via secure chat. Placed pt on 2L [place] O2 Sat now 95-96 on 2L. Will continue to monitor. Waiting for further instruction from MD

## 2019-06-25 NOTE — Consult Note (Signed)
Urology Consult  Referring physician: Ivor Costa Reason for referral: Hydronephrosis  Chief Complaint:  hydronephrosis  History of Present Illness:  Patient was admitted today with medical comorbidities and has had a right nephrectomy for renal cell carcinoma by Dr Zannie Cove.  She was noted to have hydronephrosis on the left kidney by renal ultrasound and a CT scan was ordered.  Her serum creatinine was elevated.  Internal Medicine note was positive for a bilateral leg edema for 3 weeks.   Serum potassium was less than 2 and serum creatinine was 2.41 and the baseline was not available   renal ultrasound demonstrated mild to moderate hydronephrosis with no ureteral jet   on CT scan patient have mild left hydronephrosis to the  Level of the pelvis.  There was a soft tissue density within the ureter which may reflect hemorrhage or soft tissue mass such as a urothelial cancer.  The patient has no flank pain nausea or vomiting.  She has some loose bowel movements.  No bladder surgery.  Distant history of bladder infections.  Gets up 3 times a night.  At baseline she is continent  Modifying factors: There are no other modifying factors  Associated signs and symptoms: There are no other associated signs and symptoms Aggravating and relieving factors: There are no other aggravating or relieving factors Severity: Moderate Duration: Persistent        Past Medical History:  Diagnosis Date  . Anxiety   . Cancer ([place])   . Depression   . History of kidney cancer   . Hyperlipidemia   . Hypertension    Past Surgical History:  Procedure Laterality Date  . bladder-partial removal    . kidney removed      Medications: I have reviewed the patient's current medications. Allergies:  Allergies  Allergen Reactions  . Erythromycin Diarrhea and Nausea And Vomiting  . Other     Unknown blood pressure pill caused severe coughing    Family History  Problem Relation Age of Onset  . Hypertension  Mother   . Diabetes Mellitus II Sister   . Hypertension Sister   . Hypertension Brother    Social History:  reports that she quit smoking about 15 years ago. Her smoking use included cigarettes. She has a 19.00 pack-year smoking history. She has never used smokeless tobacco. She reports that she does not drink alcohol or use drugs.  ROS: All systems are reviewed and negative except as noted. Rest   Negative  Physical Exam:  Vital signs in last 24 hours: Temp:  [98 F (36.7 C)-98.7 F (37.1 C)] 98.1 F (36.7 C) (09/16 0939) Pulse Rate:  [68-88] 82 (09/16 0939) Resp:  [16-27] 18 (09/16 0939) BP: (168-207)/(66-134) 177/75 (09/16 0939) SpO2:  [87 %-97 %] 92 % (09/16 0939) Weight:  [85.3 kg-85.9 kg] 85.9 kg (09/16 1037)  Cardiovascular: Skin warm; not flushed Respiratory: Breaths quiet; no shortness of breath Abdomen: No masses Neurological: Normal sensation to touch Musculoskeletal: Normal motor function arms and legs Lymphatics: No inguinal adenopathy Skin: No rashes Genitourinary: No bladder or CVA tenderness; nontoxic  Laboratory Data:  Results for orders placed or performed during the hospital encounter of 06/24/19 (from the past 72 hour(s))  CBC with Differential     Status: Abnormal   Collection Time: 06/24/19  6:48 PM  Result Value Ref Range   WBC 9.2 4.0 - 10.5 K/uL   RBC 3.55 (L) 3.87 - 5.11 MIL/uL   Hemoglobin 11.2 (L) 12.0 - 15.0 g/dL  HCT 32.5 (L) 36.0 - 46.0 %   MCV 91.5 80.0 - 100.0 fL   MCH 31.5 26.0 - 34.0 pg   MCHC 34.5 30.0 - 36.0 g/dL   RDW 14.0 11.5 - 15.5 %   Platelets 223 150 - 400 K/uL   nRBC 0.0 0.0 - 0.2 %   Neutrophils Relative % 63 %   Neutro Abs 5.8 1.7 - 7.7 K/uL   Lymphocytes Relative 24 %   Lymphs Abs 2.2 0.7 - 4.0 K/uL   Monocytes Relative 9 %   Monocytes Absolute 0.8 0.1 - 1.0 K/uL   Eosinophils Relative 3 %   Eosinophils Absolute 0.3 0.0 - 0.5 K/uL   Basophils Relative 1 %   Basophils Absolute 0.1 0.0 - 0.1 K/uL   Immature  Granulocytes 0 %   Abs Immature Granulocytes 0.03 0.00 - 0.07 K/uL    Comment: Performed at [facility] [address] [place], [place] [address]  Comprehensive metabolic panel     Status: Abnormal   Collection Time: 06/24/19  6:48 PM  Result Value Ref Range   Sodium 143 135 - 145 mmol/L   Potassium <2.0 (LL) 3.5 - 5.1 mmol/L    Comment: CRITICAL RESULT CALLED TO, READ BACK BY AND VERIFIED WITH: K [facility] 2008 06/24/2019 WBOND    Chloride 94 (L) 98 - 111 mmol/L   CO2 32 22 - 32 mmol/L   Glucose, Bld 157 (H) 70 - 99 mg/dL   BUN 17 8 - 23 mg/dL   Creatinine, Ser 2.54 (H) 0.44 - 1.00 mg/dL   Calcium 7.5 (L) 8.9 - 10.3 mg/dL   Total Protein 6.7 6.5 - 8.1 g/dL   Albumin 3.7 3.5 - 5.0 g/dL   AST 27 15 - 41 U/L   ALT 17 0 - 44 U/L   Alkaline Phosphatase 50 38 - 126 U/L   Total Bilirubin 0.7 0.3 - 1.2 mg/dL   GFR calc non Af Amer 18 (L) >60 mL/min   GFR calc Af Amer 21 (L) >60 mL/min   Anion gap 17 (H) 5 - 15    Comment: Performed at [facility], [place] [address], [place], [place] [address]  Magnesium     Status: Abnormal   Collection Time: 06/24/19 11:12 PM  Result Value Ref Range   Magnesium 1.2 (L) 1.7 - 2.4 mg/dL    Comment: Performed at [facility] [address] [place], [place] [address]  Urinalysis, Routine w reflex microscopic     Status: Abnormal   Collection Time: 06/25/19  3:19 AM  Result Value Ref Range   Color, Urine STRAW (A) YELLOW   APPearance CLEAR CLEAR   Specific Gravity, Urine 1.005 1.005 - 1.030   pH 7.0 5.0 - 8.0   Glucose, UA NEGATIVE NEGATIVE mg/dL   Hgb urine dipstick SMALL (A) NEGATIVE   Bilirubin Urine NEGATIVE NEGATIVE   Ketones, ur NEGATIVE NEGATIVE mg/dL   Protein, ur NEGATIVE NEGATIVE mg/dL   Nitrite NEGATIVE NEGATIVE   Leukocytes,Ua SMALL (A) NEGATIVE   RBC / HPF 0-5 0 - 5 RBC/hpf   WBC, UA 6-10 0 - 5 WBC/hpf   Bacteria, UA RARE (A) NONE SEEN   Squamous Epithelial / LPF 0-5 0 - 5   Mucus PRESENT     Comment:  Performed at [facility], [address], [place], [place] [address]  Creatinine, urine, random     Status: None   Collection Time: 06/25/19  6:22 AM  Result Value Ref Range   Creatinine, Urine 47.34 mg/dL    Comment: Performed at [facility] [address] [place], [place] [address]  Brain natriuretic peptide     Status: Abnormal   Collection Time: 06/25/19  6:22 AM  Result Value Ref Range   B Natriuretic Peptide 159.7 (H) 0.0 - 100.0 pg/mL    Comment: Performed at [facility] [address], [place], [place] [id number]  Basic metabolic panel     Status: Abnormal   Collection Time: 06/25/19  6:22 AM  Result Value Ref Range   Sodium 144 135 - 145 mmol/L   Potassium <2.0 (LL) 3.5 - 5.1 mmol/L    Comment: CRITICAL RESULT CALLED TO, READ BACK BY AND VERIFIED WITH: P PULLIAM,RN [id number] WILDERK    Chloride 97 (L) 98 - 111 mmol/L   CO2 33 (H) 22 - 32 mmol/L   Glucose, Bld 116 (H) 70 - 99 mg/dL   BUN 18 8 - 23 mg/dL   Creatinine, Ser 2.41 (H) 0.44 - 1.00 mg/dL   Calcium 7.5 (L) 8.9 - 10.3 mg/dL   GFR calc non Af Amer 20 (L) >60 mL/min   GFR calc Af Amer 23 (L) >60 mL/min   Anion gap 14 5 - 15    Comment: Performed at [facility], [place] [address], [place], [place] [address]  CBC     Status: Abnormal   Collection Time: 06/25/19  6:22 AM  Result Value Ref Range   WBC 9.8 4.0 - 10.5 K/uL   RBC 3.49 (L) 3.87 - 5.11 MIL/uL   Hemoglobin 11.0 (L) 12.0 - 15.0 g/dL   HCT 31.8 (L) 36.0 - 46.0 %   MCV 91.1 80.0 - 100.0 fL   MCH 31.5 26.0 - 34.0 pg   MCHC 34.6 30.0 - 36.0 g/dL   RDW 14.2 11.5 - 15.5 %   Platelets 214 150 - 400 K/uL   nRBC 0.0 0.0 - 0.2 %    Comment: Performed at [facility], [place] [address] [place], [place] [address]  Magnesium     Status: Abnormal   Collection Time: 06/25/19  6:22 AM  Result Value Ref Range   Magnesium 2.7 (H) 1.7 - 2.4 mg/dL    Comment: Performed at [facility] [address] [place], [place] [address]    No results found for this or any previous visit (from the past 240 hour(s)). Creatinine: Recent Labs    06/24/19 1848 06/25/19 0622  CREATININE 2.54* 2.41*    Xrays: See report/chart I reviewed with radiology and the patient has a potential filling defect or soft tissue mass at the level of the iliacs on the left side.  In their opinion there is no small stone distally at the ureterovesical junction.  They felt that the ureteropelvic junction was normal.  Impression/Assessment:  The patient has a filling defect in left ureter.  She does not know her baseline renal function.  She is getting potassium.  Plan:  I recommend to call her primary care physician for baseline labs if present.  She should be medically managed for her low potassium.  I spoke to 1 of my partners and we both agree that cystoscopy and retrograde with ureteroscopy with biopsy if needed would be the best primary treatment as opposed to a stent that could give a false positive or affect the biopsy and ureteroscopic findings in the future.  Pending results of stent to  be left in for her single kidney and mild hydronephrosis and her serum creatinine follow-up.  Patient was not n.p.o. and I kept her n.p.o.  I will follow her serum creatinine in the next several hours and tomorrow morning and hopefully the above procedure can be done more as an outpatient as opposed to acutely.  She would need transfer to [facility] to have this procedure.  Shell Blanchette A Gurjot Brisco 06/25/2019, 12:00 PM

## 2019-06-25 NOTE — Treatment Plan (Addendum)
Kerrie Pleasure MD spoke with ED team to request CT stone protocol prior to urology consult with Bjorn Loser today. ED to call consult when CT results available.

## 2019-06-25 NOTE — ED Notes (Signed)
Bladder scan volume =340mL

## 2019-06-25 NOTE — Progress Notes (Signed)
   Follow Up Note  HPI: Please see H&P done earlier today for full details  Briefly, 73 year old female with history significant for hypertension, hyperlipidemia, renal cell carcinoma status post right nephrectomy, possible IBS, presents today ED by referral from her primary care doctor.  Patient had some labs done in the office and was told to go to the emergency room.  Patient was found to have significant hypokalemia with worsening renal function.  Patient has been complaining of frequent loose stools/diarrhea which he attributes to her IBS.  Patient also reported bilateral lower leg edema.  The ED, patient was found to have AKI with creatinine of 2.54, potassium less than 2 as well as hypomagnesemia.  Renal ultrasound showed hydronephrosis, subsequent CT renal stone showed possible mass/hemorrhage in the ureter.  Urology consulted, plan for possible cystoscopy in the a.m.  Patient admitted for further management.    Today, patient denies any new complaints, denies any chest pain, shortness of breath, still having loose stools, denied any significant abdominal pain, fever/chills, recent weight loss.  Exam:  General: NAD   Cardiovascular: S1, S2 present  Respiratory: CTAB  Abdomen: Soft, nontender, nondistended, bowel sounds present  Musculoskeletal: Trace bilateral pedal edema noted  Skin: Normal  Psychiatry: Normal mood   Present on Admission: . Hypertension . Hyperlipidemia . Depression . Hypokalemia . Hypomagnesemia . Bilateral leg edema . AKI (acute kidney injury) ([place]) . Diarrhea   A/P AKI likely 2/2 mild left hydroureteronephrosis 2/2 possible urothelial malignancy Creatinine 2.54 on admission, no baseline to compare CT no stone showed mild left hydroureteronephrosis with possible soft tissue density material within the ureter which may reflect hemorrhage or soft tissue mass such as urothelial malignancy Urology on board, plan for possible cystoscopy for biopsy in  the a.m. N.p.o. after midnight Frequent BMP Continue IV fluids  Hypokalemia/hypomagnesemia Continue aggressive replacement Frequent BMP  Diarrhea/IBS Monitor closely Consider sending out GI panel  Hypertension Hold home HCTZ Start amlodipine, hydralazine PRN  Hyperlipidemia Continue Zocor  Depression Continue Zoloft

## 2019-06-25 NOTE — Progress Notes (Signed)
CRITICAL VALUE ALERT  Critical Value:  Potassium of 2.3  Date & Time Notied:  06/24/2019 - 1523  Provider Notified: Yes  Orders Received/Actions taken: Awaiting further instructions

## 2019-06-26 ENCOUNTER — Other Ambulatory Visit: Payer: Self-pay | Admitting: Urology

## 2019-06-26 ENCOUNTER — Inpatient Hospital Stay (HOSPITAL_COMMUNITY): Payer: Medicare PPO

## 2019-06-26 DIAGNOSIS — R0902 Hypoxemia: Secondary | ICD-10-CM

## 2019-06-26 LAB — CBC WITH DIFFERENTIAL/PLATELET
Abs Immature Granulocytes: 0.05 10*3/uL (ref 0.00–0.07)
Basophils Absolute: 0 10*3/uL (ref 0.0–0.1)
Basophils Relative: 0 %
Eosinophils Absolute: 0.4 10*3/uL (ref 0.0–0.5)
Eosinophils Relative: 4 %
HCT: 31.7 % — ABNORMAL LOW (ref 36.0–46.0)
Hemoglobin: 10.4 g/dL — ABNORMAL LOW (ref 12.0–15.0)
Immature Granulocytes: 0 %
Lymphocytes Relative: 16 %
Lymphs Abs: 1.9 10*3/uL (ref 0.7–4.0)
MCH: 30.7 pg (ref 26.0–34.0)
MCHC: 32.8 g/dL (ref 30.0–36.0)
MCV: 93.5 fL (ref 80.0–100.0)
Monocytes Absolute: 0.9 10*3/uL (ref 0.1–1.0)
Monocytes Relative: 8 %
Neutro Abs: 8.6 10*3/uL — ABNORMAL HIGH (ref 1.7–7.7)
Neutrophils Relative %: 72 %
Platelets: 227 10*3/uL (ref 150–400)
RBC: 3.39 MIL/uL — ABNORMAL LOW (ref 3.87–5.11)
RDW: 14.8 % (ref 11.5–15.5)
WBC: 11.9 10*3/uL — ABNORMAL HIGH (ref 4.0–10.5)
nRBC: 0 % (ref 0.0–0.2)

## 2019-06-26 LAB — BASIC METABOLIC PANEL
Anion gap: 13 (ref 5–15)
Anion gap: 10 (ref 5–15)
BUN: 14 mg/dL (ref 8–23)
BUN: 13 mg/dL (ref 8–23)
CO2: 28 mmol/L (ref 22–32)
CO2: 28 mmol/L (ref 22–32)
Calcium: 7.4 mg/dL — ABNORMAL LOW (ref 8.9–10.3)
Calcium: 7.8 mg/dL — ABNORMAL LOW (ref 8.9–10.3)
Chloride: 103 mmol/L (ref 98–111)
Chloride: 104 mmol/L (ref 98–111)
Creatinine, Ser: 2.2 mg/dL — ABNORMAL HIGH (ref 0.44–1.00)
Creatinine, Ser: 2.03 mg/dL — ABNORMAL HIGH (ref 0.44–1.00)
GFR calc Af Amer: 25 mL/min — ABNORMAL LOW (ref >60)
GFR calc Af Amer: 28 mL/min — ABNORMAL LOW (ref >60)
GFR calc non Af Amer: 22 mL/min — ABNORMAL LOW (ref >60)
GFR calc non Af Amer: 24 mL/min — ABNORMAL LOW (ref >60)
Glucose, Bld: 112 mg/dL — ABNORMAL HIGH (ref 70–99)
Glucose, Bld: 153 mg/dL — ABNORMAL HIGH (ref 70–99)
Potassium: 2.8 mmol/L — ABNORMAL LOW (ref 3.5–5.1)
Potassium: 3.6 mmol/L (ref 3.5–5.1)
Sodium: 144 mmol/L (ref 135–145)
Sodium: 142 mmol/L (ref 135–145)

## 2019-06-26 LAB — CREATININE, URINE, RANDOM: Creatinine, Urine: 49.01 mg/dL

## 2019-06-26 LAB — MAGNESIUM: Magnesium: 2 mg/dL (ref 1.7–2.4)

## 2019-06-26 LAB — SODIUM, URINE, RANDOM: Sodium, Ur: 36 mmol/L

## 2019-06-26 LAB — UREA NITROGEN, URINE: Urea Nitrogen, Ur: 199 mg/dL

## 2019-06-26 MED ORDER — POTASSIUM CHLORIDE CRYS ER 20 MEQ PO TBCR
40.0000 meq | EXTENDED_RELEASE_TABLET | Freq: Once | ORAL | Status: AC
  Administered 2019-06-26: 40 meq via ORAL
  Filled 2019-06-26: qty 2

## 2019-06-26 MED ORDER — POTASSIUM CHLORIDE 20 MEQ/15ML (10%) PO SOLN: 40.0000 meq | Freq: Once | ORAL | Status: AC

## 2019-06-26 MED ORDER — FUROSEMIDE 10 MG/ML IJ SOLN
40.0000 mg | Freq: Once | INTRAMUSCULAR | Status: AC
  Administered 2019-06-26: 40 mg via INTRAVENOUS
  Filled 2019-06-26: qty 4

## 2019-06-26 MED ORDER — HYDRALAZINE HCL 20 MG/ML IJ SOLN
5.0000 mg | INTRAMUSCULAR | Status: DC | PRN
  Administered 2019-06-26 – 2019-06-27 (×2): 5 mg via INTRAVENOUS
  Filled 2019-06-26 (×2): qty 1

## 2019-06-26 MED ORDER — POTASSIUM CHLORIDE 10 MEQ/100ML IV SOLN
INTRAVENOUS | Status: AC
  Filled 2019-06-26: qty 100

## 2019-06-26 MED ORDER — HYDRALAZINE HCL 25 MG PO TABS
25.0000 mg | ORAL_TABLET | Freq: Three times a day (TID) | ORAL | Status: DC
  Administered 2019-06-26 – 2019-07-01 (×14): 25 mg via ORAL
  Filled 2019-06-26 (×14): qty 1

## 2019-06-26 MED ORDER — HYDRALAZINE HCL 50 MG PO TABS
50.0000 mg | ORAL_TABLET | ORAL | Status: AC
  Administered 2019-06-26: 50 mg via ORAL
  Filled 2019-06-26: qty 1

## 2019-06-26 MED ORDER — POTASSIUM CHLORIDE 10 MEQ/100ML IV SOLN
10.0000 meq | INTRAVENOUS | Status: AC
  Administered 2019-06-26 (×5): 10 meq via INTRAVENOUS
  Filled 2019-06-26 (×5): qty 100

## 2019-06-26 MED ORDER — PNEUMOCOCCAL VAC POLYVALENT 25 MCG/0.5ML IJ INJ
0.5000 mL | INJECTION | INTRAMUSCULAR | Status: AC
  Administered 2019-06-27: 0.5 mL via INTRAMUSCULAR
  Filled 2019-06-26: qty 0.5

## 2019-06-26 MED ORDER — HEPARIN SODIUM (PORCINE) 5000 UNIT/ML IJ SOLN
5000.0000 [IU] | Freq: Three times a day (TID) | INTRAMUSCULAR | Status: DC
  Administered 2019-06-26 – 2019-06-28 (×5): 5000 [IU] via SUBCUTANEOUS
  Filled 2019-06-26 (×5): qty 1

## 2019-06-26 MED ORDER — AMLODIPINE BESYLATE 10 MG PO TABS
10.0000 mg | ORAL_TABLET | Freq: Every day | ORAL | Status: DC
  Administered 2019-06-26 – 2019-07-01 (×6): 10 mg via ORAL
  Filled 2019-06-26 (×6): qty 1

## 2019-06-26 MED ORDER — INFLUENZA VAC A&B SA ADJ QUAD 0.5 ML IM PRSY
0.5000 mL | PREFILLED_SYRINGE | INTRAMUSCULAR | Status: AC
  Administered 2019-06-27: 10:00:00 0.5 mL via INTRAMUSCULAR
  Filled 2019-06-26: qty 0.5

## 2019-06-26 NOTE — Progress Notes (Signed)
Subjective: Patient reports she is feeling a little better.  No flank pain. Objective: Vital signs in last 24 hours: Temp:  [97.7 F (36.5 C)-98.1 F (36.7 C)] 98.1 F (36.7 C) (09/17 0805) Pulse Rate:  [82-98] 94 (09/17 0805) Resp:  [16-20] 17 (09/17 0505) BP: (170-206)/(75-112) 170/78 (09/17 0805) SpO2:  [90 %-95 %] 94 % (09/17 0805) Weight:  [85.9 kg-87.7 kg] 87.7 kg (09/17 0507)  Intake/Output from previous day: 09/16 0701 - 09/17 0700 In: 2405 [P.O.:240; I.V.:1683.9; IV Piggyback:481.2] Out: 1400 [Urine:1400] Intake/Output this shift: No intake/output data recorded.  Physical Exam:  Constitutional: Vital signs reviewed. WD WN in NAD   Eyes: PERRL, No scleral icterus.   Cardiovascular: RRR Pulmonary/Chest: Normal effort   Lab Results: Recent Labs    06/24/19 1848 06/25/19 0622 06/26/19 0535  HGB 11.2* 11.0* 10.4*  HCT 32.5* 31.8* 31.7*   BMET Recent Labs    06/25/19 2035 06/26/19 0535  NA 144 144  K 2.4* 2.8*  CL 102 103  CO2 30 28  GLUCOSE 111* 112*  BUN 17 14  CREATININE 2.37* 2.20*  CALCIUM 7.6* 7.4*   No results for input(s): LABPT, INR in the last 72 hours. No results for input(s): LABURIN in the last 72 hours. Results for orders placed or performed during the hospital encounter of 06/24/19  SARS CORONAVIRUS 2 (TAT 6-24 HRS) Nasopharyngeal Nasopharyngeal Swab     Status: None   Collection Time: 06/25/19  4:12 AM   Specimen: Nasopharyngeal Swab  Result Value Ref Range Status   SARS Coronavirus 2 NEGATIVE NEGATIVE Final    Comment: (NOTE) SARS-CoV-2 target nucleic acids are NOT DETECTED. The SARS-CoV-2 RNA is generally detectable in upper and lower respiratory specimens during the acute phase of infection. Negative results do not preclude SARS-CoV-2 infection, do not rule out co-infections with other pathogens, and should not be used as the sole basis for treatment or other patient management decisions. Negative results must be combined with  clinical observations, patient history, and epidemiological information. The expected result is Negative. Fact Sheet for Patients: SugarRoll.be Fact Sheet for Healthcare Providers: https://www.woods-mathews.com/ This test is not yet approved or cleared by the [place] FDA and  has been authorized for detection and/or diagnosis of SARS-CoV-2 by FDA under an Emergency Use Authorization (EUA). This EUA will remain  in effect (meaning this test can be used) for the duration of the COVID-19 declaration under Section 56 4(b)(1) of the Act, 21 U.S.C. section 360bbb-3(b)(1), unless the authorization is terminated or revoked sooner. Performed at [facility], [place] [address] [place], [place] [address]     Studies/Results: US Renal  Result Date: 06/25/2019 CLINICAL DATA:  Acute kidney injury EXAM: RENAL / URINARY TRACT ULTRASOUND COMPLETE COMPARISON:  05/10/2012 FINDINGS: Right Kidney: History of right nephrectomy.  No findings in the renal fossa Left Kidney: Renal measurements: 13 x 6 x 7 cm = volume: 300 mL. Mild-to-moderate hydronephrosis without visible obstructive process. Negative for collection. Bladder: Appears normal for degree of bladder distention. Inspection of bladder jet not noted. IMPRESSION: Hydronephrosis of the solitary left kidney. Electronically Signed   By: Monte Fantasia M.D.   On: 06/25/2019 05:33   Dg Chest Port 1 View  Result Date: 06/25/2019 CLINICAL DATA:  Dyspnea EXAM: PORTABLE CHEST 1 VIEW COMPARISON:  08/24/2009, 06/25/2019 FINDINGS: The heart size is mildly enlarged, stable compared to prior CT. Calcific aortic knob. No focal airspace consolidation. No pleural effusion or pneumothorax. IMPRESSION: No acute cardiopulmonary findings. Electronically Signed  By: Davina Poke M.D.   On: 06/25/2019 13:44   Ct Renal Stone Study  Result Date: 06/25/2019 CLINICAL DATA:  Lower abdominal pain on both sides for 3 weeks  EXAM: CT ABDOMEN AND PELVIS WITHOUT CONTRAST TECHNIQUE: Multidetector CT imaging of the abdomen and pelvis was performed following the standard protocol without IV contrast. COMPARISON:  07/10/2012 FINDINGS: Lower chest: No acute abnormality. Hepatobiliary: No focal liver abnormality is seen. No gallstones, gallbladder wall thickening, or biliary dilatation. Pancreas: Unremarkable. No pancreatic ductal dilatation or surrounding inflammatory changes. Spleen: Normal in size without focal abnormality. Adrenals/Urinary Tract: Normal adrenal glands. Prior right nephrectomy. No left renal mass. Mild left hydroureteronephrosis to the level of the pelvis with there appears to be soft tissue density material within the ureter which may reflect hemorrhage or soft tissue mass such as urothelial malignancy. No urolithiasis. Normal bladder. Stomach/Bowel: Stomach is within normal limits. No evidence of bowel wall thickening, distention, or inflammatory changes. Diverticulosis without evidence of diverticulitis. Vascular/Lymphatic: Normal caliber abdominal aorta with mild atherosclerosis. No lymphadenopathy. Reproductive: Status post hysterectomy. No adnexal masses. Other: No abdominal wall hernia or abnormality. No abdominopelvic ascites. Musculoskeletal: No acute osseous abnormality. No aggressive degenerative disease with disc height loss at L2-3, L4-5 and L5-S1. Bilateral facet arthropathy of the lumbar spine. IMPRESSION: 1. Mild left hydroureteronephrosis to the level of the pelvis with there appears to be soft tissue density material within the ureter which may reflect hemorrhage or soft tissue mass such as urothelial malignancy. Recommend correlation with urinalysis and urology consultation. 2. Diverticulosis without evidence of diverticulitis. Electronically Signed   By: Kathreen Devoid   On: 06/25/2019 09:31    Assessment/Plan:   I reviewed the CT findings with Caren Griffins.  She does have mild left hydronephrosis.  This  may be contributing some to her elevated creatinine, but the AKI may well be related to her diarrhea.  At this point, I do not think we need to perform urgent diagnostic procedure, but would prefer her hypokalemia to be corrected, and I would think that early in the week I can put her on the schedule for cystoscopy, left retrograde, left ureteroscopy with possible biopsy of any urothelial lesions.  I have discussed this with her.  She is in agreement.  If her medical condition improves to the point where she can be discharged before then, I can do that as an outpatient.   LOS: 1 day   Jorja Loa 06/26/2019, 9:36 AM

## 2019-06-26 NOTE — Progress Notes (Signed)
PROGRESS NOTE                                                                                                                                                                                                             Patient Demographics:    [name], is a 73 y.o. female, DOB - [birth year], [id number]  Admit date - 06/24/2019   Admitting Physician Ivor Costa, MD  Outpatient Primary MD for the patient is Darcus Austin, MD (Inactive)  LOS - 1   Chief Complaint  Patient presents with   Abnormal Lab       Brief Narrative    73 year old female with history significant for hypertension, hyperlipidemia, renal cell carcinoma status post right nephrectomy she follows with [name]) , possible IBS, patient was sent to ED PCP given worsening renal function in the office at 2.3, patient baseline renal function is around 1.1(most recent last June), patient reports some diarrhea, renal ultrasound did show left kidney hydronephrosis, with subsequent renal stone showed study showing possible mass/hemorrhage in the ureter, urology consulted.   Subjective:    [name] today does report some dyspnea, she has new oxygen requirement overnight, she denies any flank pain .   Assessment  & Plan :    Principal Problem:   AKI (acute kidney injury) ([place]) Active Problems:   Hypertension   Hyperlipidemia   Depression   Hypokalemia   Hypomagnesemia   Bilateral leg edema   Diarrhea  AKI -Baseline creatinine around 1.1(obtain labs from PCP), with recent creatinine around 2.5 range, she was kept on IV fluids, with mild improvement of renal function, it is 2.2 today. -We will send urine sodium, urine creatinine, to calculate Fina to see if it is post renal versus prerenal AKI. -Some diarrhea, which certainly will cause some dehydration and hypokalemia, but she is appropriately volume resuscitated, actually she is currently volume overloaded, will  discontinue IV fluids.  Acute hypoxic respiratory failure -Secondary to volume overload from IV resuscitation she has been receiving, evident by chest x-ray showing some pulmonary edema, and she is having worsening lower extremity edema as well. will DC IV fluids, actually I will give 1 dose of IV Lasix.  Left mild hydronephrosis -Urology input greatly appreciated, plan for cystoscopy, left retrograde, left ureteroscopy with possible biopsy of any urothelial lesion, likely will need stent  placement as discussed with urology. -Seizure is being planned for Monday morning, discussed with urology, patient can be discharged if stable can be arranged as an outpatient. -Discussed with urology, if patient with worsening renal function after appropriate diuresis, then likely will need that procedure to be done sooner.  Hypokalemia -Most likely elated to diarrhea, repleted   Hypomagnesemia - Repleted  Hypertension:   -Significantly elevated, hydrochlorothiazide on hold, I will increase her amlodipine to milligrams oral daily, and will start on oral hydralazine as well, and continue with PRN hydralazine.  Hyperlipidemia: -zocor  Depression: -Zoloft  Diarrhea: Patient states that her diarrhea has stopped currently.  She attributes this to her chronic IBS issue.    Code Status : Full  Family Communication  : D/W patient  Disposition Plan  : Home  Consults  :  Urology  Procedures  : none  DVT Prophylaxis  :  [place] heparin  Lab Results  Component Value Date   PLT 227 06/26/2019    Antibiotics  :   Anti-infectives (From admission, onward)   None        Objective:   Vitals:   06/26/19 0505 06/26/19 0507 06/26/19 0805 06/26/19 1148  BP: (!) 170/77  (!) 170/78 (!) 164/79  Pulse: 93  94 91  Resp: 17   18  Temp: 98.1 F (36.7 C)  98.1 F (36.7 C) 98.6 F (37 C)  TempSrc: Oral  Oral Oral  SpO2: 95%  94% 92%  Weight:  87.7 kg    Height:        Wt Readings from Last 3  Encounters:  06/26/19 87.7 kg  01/04/17 78.5 kg  12/05/13 72.6 kg     Intake/Output Summary (Last 24 hours) at 06/26/2019 1401 Last data filed at 06/26/2019 0507 Gross per 24 hour  Intake 2305.04 ml  Output 1400 ml  Net 905.04 ml     Physical Exam  Awake Alert, Oriented X 3, No new F.N deficits, Normal affect Symmetrical Chest wall movement, Good air movement bilaterally, bibasilar crackles RRR,No Gallops,Rubs or new Murmurs, No Parasternal Heave +ve B.Sounds, Abd Soft, No tenderness, No organomegaly appriciated, no flank pain, no rebound - guarding or rigidity. No Cyanosis, Clubbing, +1 edema bilaterally    Data Review:    CBC Recent Labs  Lab 06/24/19 1848 06/25/19 0622 06/26/19 0535  WBC 9.2 9.8 11.9*  HGB 11.2* 11.0* 10.4*  HCT 32.5* 31.8* 31.7*  PLT 223 214 227  MCV 91.5 91.1 93.5  MCH 31.5 31.5 30.7  MCHC 34.5 34.6 32.8  RDW 14.0 14.2 14.8  LYMPHSABS 2.2  --  1.9  MONOABS 0.8  --  0.9  EOSABS 0.3  --  0.4  BASOSABS 0.1  --  0.0    Chemistries  Recent Labs  Lab 06/24/19 1848 06/24/19 2312 06/25/19 0622 06/25/19 1400 06/25/19 2035 06/26/19 0535  NA 143  --  144 145 144 144  K <2.0*  --  <2.0* 2.3* 2.4* 2.8*  CL 94*  --  97* 100 102 103  CO2 32  --  33* 30 30 28   GLUCOSE 157*  --  116* 120* 111* 112*  BUN 17  --  18 17 17 14   CREATININE 2.54*  --  2.41* 2.27* 2.37* 2.20*  CALCIUM 7.5*  --  7.5* 7.8* 7.6* 7.4*  MG  --  1.2* 2.7*  --   --  2.0  AST 27  --   --   --   --   --  ALT 17  --   --   --   --   --   ALKPHOS 50  --   --   --   --   --   BILITOT 0.7  --   --   --   --   --    ------------------------------------------------------------------------------------------------------------------ No results for input(s): CHOL, HDL, LDLCALC, TRIG, CHOLHDL, LDLDIRECT in the last 72 hours.  No results found for: HGBA1C ------------------------------------------------------------------------------------------------------------------ No results  for input(s): TSH, T4TOTAL, T3FREE, THYROIDAB in the last 72 hours.  Invalid input(s): FREET3 ------------------------------------------------------------------------------------------------------------------ No results for input(s): VITAMINB12, FOLATE, FERRITIN, TIBC, IRON, RETICCTPCT in the last 72 hours.  Coagulation profile No results for input(s): INR, PROTIME in the last 168 hours.  No results for input(s): DDIMER in the last 72 hours.  Cardiac Enzymes No results for input(s): CKMB, TROPONINI, MYOGLOBIN in the last 168 hours.  Invalid input(s): CK ------------------------------------------------------------------------------------------------------------------    Component Value Date/Time   BNP 159.7 (H) 06/25/2019 0622    Inpatient Medications  Scheduled Meds:  amLODipine  10 mg Oral Daily   calcium carbonate  1 tablet Oral Daily   cholecalciferol  1,000 Units Oral Daily   furosemide  40 mg Intravenous Once   hydrALAZINE  25 mg Oral Q8H   [START ON 06/27/2019] influenza vaccine adjuvanted  0.5 mL Intramuscular Tomorrow-1000   [START ON 06/27/2019] pneumococcal 23 valent vaccine  0.5 mL Intramuscular Tomorrow-1000   potassium chloride  40 mEq Oral Once   sertraline  50 mg Oral Daily   simvastatin  20 mg Oral Daily   traZODone  100 mg Oral QHS   Continuous Infusions:  potassium chloride 10 mEq (06/26/19 1302)   PRN Meds:.acetaminophen **OR** acetaminophen, hydrALAZINE, ondansetron **OR** ondansetron (ZOFRAN) IV  Micro Results Recent Results (from the past 240 hour(s))  SARS CORONAVIRUS 2 (TAT 6-24 HRS) Nasopharyngeal Nasopharyngeal Swab     Status: None   Collection Time: 06/25/19  4:12 AM   Specimen: Nasopharyngeal Swab  Result Value Ref Range Status   SARS Coronavirus 2 NEGATIVE NEGATIVE Final    Comment: (NOTE) SARS-CoV-2 target nucleic acids are NOT DETECTED. The SARS-CoV-2 RNA is generally detectable in upper and lower respiratory specimens  during the acute phase of infection. Negative results do not preclude SARS-CoV-2 infection, do not rule out co-infections with other pathogens, and should not be used as the sole basis for treatment or other patient management decisions. Negative results must be combined with clinical observations, patient history, and epidemiological information. The expected result is Negative. Fact Sheet for Patients: SugarRoll.be Fact Sheet for Healthcare Providers: https://www.woods-mathews.com/ This test is not yet approved or cleared by the [place] FDA and  has been authorized for detection and/or diagnosis of SARS-CoV-2 by FDA under an Emergency Use Authorization (EUA). This EUA will remain  in effect (meaning this test can be used) for the duration of the COVID-19 declaration under Section 56 4(b)(1) of the Act, 21 U.S.C. section 360bbb-3(b)(1), unless the authorization is terminated or revoked sooner. Performed at [facility], [place] [address] [place], [place] [address]     Radiology Reports US Renal  Result Date: 06/25/2019 CLINICAL DATA:  Acute kidney injury EXAM: RENAL / URINARY TRACT ULTRASOUND COMPLETE COMPARISON:  05/10/2012 FINDINGS: Right Kidney: History of right nephrectomy.  No findings in the renal fossa Left Kidney: Renal measurements: 13 x 6 x 7 cm = volume: 300 mL. Mild-to-moderate hydronephrosis without visible obstructive process. Negative for collection. Bladder: Appears normal for degree of  bladder distention. Inspection of bladder jet not noted. IMPRESSION: Hydronephrosis of the solitary left kidney. Electronically Signed   By: Monte Fantasia M.D.   On: 06/25/2019 05:33   Dg Chest Port 1 View  Result Date: 06/26/2019 CLINICAL DATA:  73 year old female with hypoxia EXAM: PORTABLE CHEST 1 VIEW COMPARISON:  Chest radiograph dated 06/25/2019 FINDINGS: Prominence of the interstitial markings primarily involving the mid to  lower lung field compared to the prior radiograph may represent interval development of mild edema although pneumonia is not excluded. Clinical correlation is recommended. No focal consolidation, pleural effusion, pneumothorax. Stable cardiac silhouette. Atherosclerotic calcification of the aorta. No acute osseous pathology. IMPRESSION: Findings may represent interval development of mild edema although pneumonia is not excluded. Electronically Signed   By: Anner Crete M.D.   On: 06/26/2019 12:33   Dg Chest Port 1 View  Result Date: 06/25/2019 CLINICAL DATA:  Dyspnea EXAM: PORTABLE CHEST 1 VIEW COMPARISON:  08/24/2009, 06/25/2019 FINDINGS: The heart size is mildly enlarged, stable compared to prior CT. Calcific aortic knob. No focal airspace consolidation. No pleural effusion or pneumothorax. IMPRESSION: No acute cardiopulmonary findings. Electronically Signed   By: Davina Poke M.D.   On: 06/25/2019 13:44   Ct Renal Stone Study  Result Date: 06/25/2019 CLINICAL DATA:  Lower abdominal pain on both sides for 3 weeks EXAM: CT ABDOMEN AND PELVIS WITHOUT CONTRAST TECHNIQUE: Multidetector CT imaging of the abdomen and pelvis was performed following the standard protocol without IV contrast. COMPARISON:  07/10/2012 FINDINGS: Lower chest: No acute abnormality. Hepatobiliary: No focal liver abnormality is seen. No gallstones, gallbladder wall thickening, or biliary dilatation. Pancreas: Unremarkable. No pancreatic ductal dilatation or surrounding inflammatory changes. Spleen: Normal in size without focal abnormality. Adrenals/Urinary Tract: Normal adrenal glands. Prior right nephrectomy. No left renal mass. Mild left hydroureteronephrosis to the level of the pelvis with there appears to be soft tissue density material within the ureter which may reflect hemorrhage or soft tissue mass such as urothelial malignancy. No urolithiasis. Normal bladder. Stomach/Bowel: Stomach is within normal limits. No evidence of  bowel wall thickening, distention, or inflammatory changes. Diverticulosis without evidence of diverticulitis. Vascular/Lymphatic: Normal caliber abdominal aorta with mild atherosclerosis. No lymphadenopathy. Reproductive: Status post hysterectomy. No adnexal masses. Other: No abdominal wall hernia or abnormality. No abdominopelvic ascites. Musculoskeletal: No acute osseous abnormality. No aggressive degenerative disease with disc height loss at L2-3, L4-5 and L5-S1. Bilateral facet arthropathy of the lumbar spine. IMPRESSION: 1. Mild left hydroureteronephrosis to the level of the pelvis with there appears to be soft tissue density material within the ureter which may reflect hemorrhage or soft tissue mass such as urothelial malignancy. Recommend correlation with urinalysis and urology consultation. 2. Diverticulosis without evidence of diverticulitis. Electronically Signed   By: Kathreen Devoid   On: 06/25/2019 09:31     Phillips Climes M.D on 06/26/2019 at 2:01 PM  Between 7am to 7pm - Pager - [phone]  After 7pm go to [email or web address] - password [facility]  [facility] -  Office  [phone]

## 2019-06-26 NOTE — Plan of Care (Signed)
  Problem: Clinical Measurements: Goal: Respiratory complications will improve Outcome: Progressing   Problem: Activity: Goal: Risk for activity intolerance will decrease Outcome: Progressing   Problem: Pain Managment: Goal: General experience of comfort will improve Outcome: Progressing   Problem: Safety: Goal: Ability to remain free from injury will improve Outcome: Progressing

## 2019-06-26 NOTE — H&P (View-Only) (Signed)
Subjective: Patient reports she is feeling a little better.  No flank pain. Objective: Vital signs in last 24 hours: Temp:  [97.7 F (36.5 C)-98.1 F (36.7 C)] 98.1 F (36.7 C) (09/17 0805) Pulse Rate:  [82-98] 94 (09/17 0805) Resp:  [16-20] 17 (09/17 0505) BP: (170-206)/(75-112) 170/78 (09/17 0805) SpO2:  [90 %-95 %] 94 % (09/17 0805) Weight:  [85.9 kg-87.7 kg] 87.7 kg (09/17 0507)  Intake/Output from previous day: 09/16 0701 - 09/17 0700 In: 2405 [P.O.:240; I.V.:1683.9; IV Piggyback:481.2] Out: 1400 [Urine:1400] Intake/Output this shift: No intake/output data recorded.  Physical Exam:  Constitutional: Vital signs reviewed. WD WN in NAD   Eyes: PERRL, No scleral icterus.   Cardiovascular: RRR Pulmonary/Chest: Normal effort   Lab Results: Recent Labs    06/24/19 1848 06/25/19 0622 06/26/19 0535  HGB 11.2* 11.0* 10.4*  HCT 32.5* 31.8* 31.7*   BMET Recent Labs    06/25/19 2035 06/26/19 0535  NA 144 144  K 2.4* 2.8*  CL 102 103  CO2 30 28  GLUCOSE 111* 112*  BUN 17 14  CREATININE 2.37* 2.20*  CALCIUM 7.6* 7.4*   No results for input(s): LABPT, INR in the last 72 hours. No results for input(s): LABURIN in the last 72 hours. Results for orders placed or performed during the hospital encounter of 06/24/19  SARS CORONAVIRUS 2 (TAT 6-24 HRS) Nasopharyngeal Nasopharyngeal Swab     Status: None   Collection Time: 06/25/19  4:12 AM   Specimen: Nasopharyngeal Swab  Result Value Ref Range Status   SARS Coronavirus 2 NEGATIVE NEGATIVE Final    Comment: (NOTE) SARS-CoV-2 target nucleic acids are NOT DETECTED. The SARS-CoV-2 RNA is generally detectable in upper and lower respiratory specimens during the acute phase of infection. Negative results do not preclude SARS-CoV-2 infection, do not rule out co-infections with other pathogens, and should not be used as the sole basis for treatment or other patient management decisions. Negative results must be combined with  clinical observations, patient history, and epidemiological information. The expected result is Negative. Fact Sheet for Patients: SugarRoll.be Fact Sheet for Healthcare Providers: https://www.woods-mathews.com/ This test is not yet approved or cleared by the [place] FDA and  has been authorized for detection and/or diagnosis of SARS-CoV-2 by FDA under an Emergency Use Authorization (EUA). This EUA will remain  in effect (meaning this test can be used) for the duration of the COVID-19 declaration under Section 56 4(b)(1) of the Act, 21 U.S.C. section 360bbb-3(b)(1), unless the authorization is terminated or revoked sooner. Performed at [facility], [place] [address], [place], [place] [address]     Studies/Results: US Renal  Result Date: 06/25/2019 CLINICAL DATA:  Acute kidney injury EXAM: RENAL / URINARY TRACT ULTRASOUND COMPLETE COMPARISON:  05/10/2012 FINDINGS: Right Kidney: History of right nephrectomy.  No findings in the renal fossa Left Kidney: Renal measurements: 13 x 6 x 7 cm = volume: 300 mL. Mild-to-moderate hydronephrosis without visible obstructive process. Negative for collection. Bladder: Appears normal for degree of bladder distention. Inspection of bladder jet not noted. IMPRESSION: Hydronephrosis of the solitary left kidney. Electronically Signed   By: Monte Fantasia M.D.   On: 06/25/2019 05:33   Dg Chest Port 1 View  Result Date: 06/25/2019 CLINICAL DATA:  Dyspnea EXAM: PORTABLE CHEST 1 VIEW COMPARISON:  08/24/2009, 06/25/2019 FINDINGS: The heart size is mildly enlarged, stable compared to prior CT. Calcific aortic knob. No focal airspace consolidation. No pleural effusion or pneumothorax. IMPRESSION: No acute cardiopulmonary findings. Electronically Signed  By: Davina Poke M.D.   On: 06/25/2019 13:44   Ct Renal Stone Study  Result Date: 06/25/2019 CLINICAL DATA:  Lower abdominal pain on both sides for 3 weeks  EXAM: CT ABDOMEN AND PELVIS WITHOUT CONTRAST TECHNIQUE: Multidetector CT imaging of the abdomen and pelvis was performed following the standard protocol without IV contrast. COMPARISON:  07/10/2012 FINDINGS: Lower chest: No acute abnormality. Hepatobiliary: No focal liver abnormality is seen. No gallstones, gallbladder wall thickening, or biliary dilatation. Pancreas: Unremarkable. No pancreatic ductal dilatation or surrounding inflammatory changes. Spleen: Normal in size without focal abnormality. Adrenals/Urinary Tract: Normal adrenal glands. Prior right nephrectomy. No left renal mass. Mild left hydroureteronephrosis to the level of the pelvis with there appears to be soft tissue density material within the ureter which may reflect hemorrhage or soft tissue mass such as urothelial malignancy. No urolithiasis. Normal bladder. Stomach/Bowel: Stomach is within normal limits. No evidence of bowel wall thickening, distention, or inflammatory changes. Diverticulosis without evidence of diverticulitis. Vascular/Lymphatic: Normal caliber abdominal aorta with mild atherosclerosis. No lymphadenopathy. Reproductive: Status post hysterectomy. No adnexal masses. Other: No abdominal wall hernia or abnormality. No abdominopelvic ascites. Musculoskeletal: No acute osseous abnormality. No aggressive degenerative disease with disc height loss at L2-3, L4-5 and L5-S1. Bilateral facet arthropathy of the lumbar spine. IMPRESSION: 1. Mild left hydroureteronephrosis to the level of the pelvis with there appears to be soft tissue density material within the ureter which may reflect hemorrhage or soft tissue mass such as urothelial malignancy. Recommend correlation with urinalysis and urology consultation. 2. Diverticulosis without evidence of diverticulitis. Electronically Signed   By: Kathreen Devoid   On: 06/25/2019 09:31    Assessment/Plan:   I reviewed the CT findings with Caren Griffins.  She does have mild left hydronephrosis.  This  may be contributing some to her elevated creatinine, but the AKI may well be related to her diarrhea.  At this point, I do not think we need to perform urgent diagnostic procedure, but would prefer her hypokalemia to be corrected, and I would think that early in the week I can put her on the schedule for cystoscopy, left retrograde, left ureteroscopy with possible biopsy of any urothelial lesions.  I have discussed this with her.  She is in agreement.  If her medical condition improves to the point where she can be discharged before then, I can do that as an outpatient.   LOS: 1 day   Jorja Loa 06/26/2019, 9:36 AM

## 2019-06-27 ENCOUNTER — Encounter (HOSPITAL_COMMUNITY): Payer: Self-pay | Admitting: Certified Registered"

## 2019-06-27 ENCOUNTER — Inpatient Hospital Stay (HOSPITAL_COMMUNITY): Payer: Medicare PPO | Admitting: Certified Registered Nurse Anesthetist

## 2019-06-27 ENCOUNTER — Inpatient Hospital Stay (HOSPITAL_COMMUNITY): Payer: Medicare PPO

## 2019-06-27 ENCOUNTER — Encounter (HOSPITAL_COMMUNITY)
Admission: EM | Disposition: A | Discharge status: Home / Self Care | Payer: Self-pay | Source: Home / Self Care | Attending: Internal Medicine

## 2019-06-27 HISTORY — PX: CYSTOSCOPY WITH RETROGRADE PYELOGRAM, URETEROSCOPY AND STENT PLACEMENT: SHX5789

## 2019-06-27 LAB — BASIC METABOLIC PANEL
Anion gap: 12 (ref 5–15)
BUN: 11 mg/dL (ref 8–23)
CO2: 29 mmol/L (ref 22–32)
Calcium: 8.2 mg/dL — ABNORMAL LOW (ref 8.9–10.3)
Chloride: 102 mmol/L (ref 98–111)
Creatinine, Ser: 2.01 mg/dL — ABNORMAL HIGH (ref 0.44–1.00)
GFR calc Af Amer: 28 mL/min — ABNORMAL LOW (ref >60)
GFR calc non Af Amer: 24 mL/min — ABNORMAL LOW (ref >60)
Glucose, Bld: 100 mg/dL — ABNORMAL HIGH (ref 70–99)
Potassium: 2.8 mmol/L — ABNORMAL LOW (ref 3.5–5.1)
Sodium: 143 mmol/L (ref 135–145)

## 2019-06-27 LAB — CBC
HCT: 30.2 % — ABNORMAL LOW (ref 36.0–46.0)
Hemoglobin: 10 g/dL — ABNORMAL LOW (ref 12.0–15.0)
MCH: 31.3 pg (ref 26.0–34.0)
MCHC: 33.1 g/dL (ref 30.0–36.0)
MCV: 94.4 fL (ref 80.0–100.0)
Platelets: 212 10*3/uL (ref 150–400)
RBC: 3.2 MIL/uL — ABNORMAL LOW (ref 3.87–5.11)
RDW: 15.4 % (ref 11.5–15.5)
WBC: 10.7 10*3/uL — ABNORMAL HIGH (ref 4.0–10.5)
nRBC: 0 % (ref 0.0–0.2)

## 2019-06-27 LAB — MAGNESIUM: Magnesium: 1.6 mg/dL — ABNORMAL LOW (ref 1.7–2.4)

## 2019-06-27 LAB — POTASSIUM: Potassium: 3 mmol/L — ABNORMAL LOW (ref 3.5–5.1)

## 2019-06-27 SURGERY — CYSTOURETEROSCOPY, WITH RETROGRADE PYELOGRAM AND STENT INSERTION
Anesthesia: General | Laterality: Left

## 2019-06-27 MED ORDER — OXYCODONE HCL 5 MG PO TABS: 5.0000 mg | ORAL_TABLET | Freq: Once | ORAL | Status: DC | PRN

## 2019-06-27 MED ORDER — ONDANSETRON HCL 4 MG/2ML IJ SOLN: 4.0000 mg | Freq: Once | INTRAMUSCULAR | Status: DC | PRN

## 2019-06-27 MED ORDER — GLYCOPYRROLATE PF 0.2 MG/ML IJ SOSY
PREFILLED_SYRINGE | INTRAMUSCULAR | Status: AC
  Filled 2019-06-27: qty 1

## 2019-06-27 MED ORDER — SODIUM CHLORIDE 0.9 % IV SOLN
INTRAVENOUS | Status: DC | PRN
  Administered 2019-06-27: 18:00:00 8 mL

## 2019-06-27 MED ORDER — PROPOFOL 10 MG/ML IV BOLUS
INTRAVENOUS | Status: DC | PRN
  Administered 2019-06-27: 30 mg via INTRAVENOUS
  Administered 2019-06-27: 150 mg via INTRAVENOUS

## 2019-06-27 MED ORDER — FENTANYL CITRATE (PF) 100 MCG/2ML IJ SOLN
INTRAMUSCULAR | Status: AC
  Filled 2019-06-27: qty 2

## 2019-06-27 MED ORDER — PHENYLEPHRINE 40 MCG/ML (10ML) SYRINGE FOR IV PUSH (FOR BLOOD PRESSURE SUPPORT)
PREFILLED_SYRINGE | INTRAVENOUS | Status: AC
  Filled 2019-06-27: qty 10

## 2019-06-27 MED ORDER — SODIUM CHLORIDE 0.9 % IV SOLN
INTRAVENOUS | Status: DC | PRN
  Administered 2019-06-27: 250 mL via INTRAVENOUS

## 2019-06-27 MED ORDER — OXYCODONE HCL 5 MG/5ML PO SOLN: 5.0000 mg | Freq: Once | ORAL | Status: DC | PRN

## 2019-06-27 MED ORDER — ALBUMIN HUMAN 5 % IV SOLN
INTRAVENOUS | Status: DC | PRN
  Administered 2019-06-27 (×2): via INTRAVENOUS

## 2019-06-27 MED ORDER — POTASSIUM CHLORIDE CRYS ER 20 MEQ PO TBCR
40.0000 meq | EXTENDED_RELEASE_TABLET | ORAL | Status: AC
  Administered 2019-06-27 (×2): 40 meq via ORAL
  Filled 2019-06-27 (×2): qty 2

## 2019-06-27 MED ORDER — FENTANYL CITRATE (PF) 100 MCG/2ML IJ SOLN: 25.0000 ug | INTRAMUSCULAR | Status: DC | PRN

## 2019-06-27 MED ORDER — CEFAZOLIN (ANCEF) 1 G IV SOLR: 1.0000 g | INTRAVENOUS | Status: DC

## 2019-06-27 MED ORDER — MAGNESIUM SULFATE 4 GM/100ML IV SOLN
4.0000 g | Freq: Once | INTRAVENOUS | Status: AC
  Administered 2019-06-27: 4 g via INTRAVENOUS
  Filled 2019-06-27: qty 100

## 2019-06-27 MED ORDER — ALBUMIN HUMAN 5 % IV SOLN
INTRAVENOUS | Status: AC
  Filled 2019-06-27: qty 500

## 2019-06-27 MED ORDER — LIDOCAINE HCL (CARDIAC) PF 100 MG/5ML IV SOSY
PREFILLED_SYRINGE | INTRAVENOUS | Status: DC | PRN
  Administered 2019-06-27: 50 mg via INTRAVENOUS

## 2019-06-27 MED ORDER — CEFAZOLIN SODIUM-DEXTROSE 2-4 GM/100ML-% IV SOLN: 2.0000 g | Freq: Once | INTRAVENOUS | Status: DC

## 2019-06-27 MED ORDER — SODIUM CHLORIDE 0.9 % IV SOLN
INTRAVENOUS | Status: DC | PRN
  Administered 2019-06-27: 17:00:00 50 ug/min via INTRAVENOUS

## 2019-06-27 MED ORDER — LACTATED RINGERS IV SOLN
INTRAVENOUS | Status: DC | PRN
  Administered 2019-06-27: 17:00:00 via INTRAVENOUS

## 2019-06-27 MED ORDER — FENTANYL CITRATE (PF) 100 MCG/2ML IJ SOLN
INTRAMUSCULAR | Status: DC | PRN
  Administered 2019-06-27: 100 ug via INTRAVENOUS

## 2019-06-27 MED ORDER — CEFAZOLIN SODIUM-DEXTROSE 2-3 GM-%(50ML) IV SOLR
INTRAVENOUS | Status: DC | PRN
  Administered 2019-06-27: 2 g via INTRAVENOUS

## 2019-06-27 MED ORDER — MIDAZOLAM HCL 5 MG/5ML IJ SOLN
INTRAMUSCULAR | Status: DC | PRN
  Administered 2019-06-27: 0.5 mg via INTRAVENOUS

## 2019-06-27 MED ORDER — EPHEDRINE 5 MG/ML INJ
INTRAVENOUS | Status: AC
  Filled 2019-06-27: qty 10

## 2019-06-27 MED ORDER — PHENYLEPHRINE HCL (PRESSORS) 10 MG/ML IV SOLN
INTRAVENOUS | Status: DC | PRN
  Administered 2019-06-27 (×3): 100 ug via INTRAVENOUS

## 2019-06-27 MED ORDER — LACTATED RINGERS IV SOLN
INTRAVENOUS | Status: DC
  Administered 2019-06-27: 16:00:00 via INTRAVENOUS

## 2019-06-27 MED ORDER — MIDAZOLAM HCL 2 MG/2ML IJ SOLN
INTRAMUSCULAR | Status: AC
  Filled 2019-06-27: qty 2

## 2019-06-27 MED ORDER — 0.9 % SODIUM CHLORIDE (POUR BTL) OPTIME
TOPICAL | Status: DC | PRN
  Administered 2019-06-27: 1000 mL

## 2019-06-27 MED ORDER — ONDANSETRON HCL 4 MG/2ML IJ SOLN
INTRAMUSCULAR | Status: DC | PRN
  Administered 2019-06-27: 4 mg via INTRAVENOUS

## 2019-06-27 MED ORDER — ONDANSETRON HCL 4 MG/2ML IJ SOLN
INTRAMUSCULAR | Status: AC
  Filled 2019-06-27: qty 2

## 2019-06-27 MED ORDER — LIDOCAINE 2% (20 MG/ML) 5 ML SYRINGE
INTRAMUSCULAR | Status: AC
  Filled 2019-06-27: qty 5

## 2019-06-27 MED ORDER — SODIUM CHLORIDE 0.9 % IR SOLN
Status: DC | PRN
  Administered 2019-06-27: 3000 mL

## 2019-06-27 MED ORDER — PROPOFOL 10 MG/ML IV BOLUS
INTRAVENOUS | Status: AC
  Filled 2019-06-27: qty 40

## 2019-06-27 MED ORDER — VASOPRESSIN 20 UNIT/ML IV SOLN
0.0300 [IU]/min | INTRAVENOUS | Status: DC
  Filled 2019-06-27: qty 2

## 2019-06-27 MED ORDER — EPHEDRINE SULFATE 50 MG/ML IJ SOLN
INTRAMUSCULAR | Status: DC | PRN
  Administered 2019-06-27 (×3): 10 mg via INTRAVENOUS

## 2019-06-27 MED ORDER — CEFAZOLIN SODIUM-DEXTROSE 2-4 GM/100ML-% IV SOLN
INTRAVENOUS | Status: AC
  Filled 2019-06-27: qty 100

## 2019-06-27 SURGICAL SUPPLY — 29 items
BAG URO CATCHER STRL LF (MISCELLANEOUS) ×3 IMPLANT
BASKET STONE NCOMPASS (UROLOGICAL SUPPLIES) IMPLANT
CATH URET 5FR 28IN OPEN ENDED (CATHETERS) IMPLANT
CATH URET DUAL LUMEN 6-10FR 50 (CATHETERS) ×3 IMPLANT
CLOTH BEACON ORANGE TIMEOUT ST (SAFETY) ×3 IMPLANT
CONT SPEC 4OZ CLIKSEAL STRL BL (MISCELLANEOUS) ×2 IMPLANT
COVER WAND RF STERILE (DRAPES) IMPLANT
EXTRACTOR STONE NITINOL NGAGE (UROLOGICAL SUPPLIES) ×3 IMPLANT
FIBER LASER FLEXIVA 1000 (UROLOGICAL SUPPLIES) IMPLANT
FIBER LASER FLEXIVA 365 (UROLOGICAL SUPPLIES) IMPLANT
FIBER LASER FLEXIVA 550 (UROLOGICAL SUPPLIES) IMPLANT
FIBER LASER TRAC TIP (UROLOGICAL SUPPLIES) IMPLANT
GLOVE SURG SS PI 8.0 STRL IVOR (GLOVE) IMPLANT
GOWN STRL REUS W/TWL XL LVL3 (GOWN DISPOSABLE) ×3 IMPLANT
GUIDEWIRE STR DUAL SENSOR (WIRE) ×3 IMPLANT
IV NS 1000ML (IV SOLUTION) ×3
IV NS 1000ML BAXH (IV SOLUTION) ×1 IMPLANT
IV NS IRRIG 3000ML ARTHROMATIC (IV SOLUTION) ×3 IMPLANT
KIT TURNOVER KIT A (KITS) IMPLANT
MANIFOLD NEPTUNE II (INSTRUMENTS) ×3 IMPLANT
PACK CYSTO (CUSTOM PROCEDURE TRAY) ×3 IMPLANT
PAD TELFA 2X3 NADH STRL (GAUZE/BANDAGES/DRESSINGS) ×2 IMPLANT
SHEATH URETERAL 12FRX28CM (UROLOGICAL SUPPLIES) ×2 IMPLANT
SHEATH URETERAL 12FRX35CM (MISCELLANEOUS) ×3 IMPLANT
STENT URET 6FRX24 CONTOUR (STENTS) ×2 IMPLANT
TOWEL OR 17X26 10 PK STRL BLUE (TOWEL DISPOSABLE) ×2 IMPLANT
TUBING CONNECTING 10 (TUBING) ×2 IMPLANT
TUBING CONNECTING 10' (TUBING) ×1
TUBING UROLOGY SET (TUBING) ×3 IMPLANT

## 2019-06-27 NOTE — Anesthesia Preprocedure Evaluation (Signed)
Anesthesia Evaluation  Patient identified by MRN, date of birth, ID band Patient awake    Reviewed: Allergy & Precautions, NPO status , Patient's Chart, lab work & pertinent test results  Airway Mallampati: II  TM Distance: >3 FB Neck ROM: Full    Dental  (+) Teeth Intact, Dental Advisory Given   Pulmonary former smoker,    breath sounds clear to auscultation       Cardiovascular hypertension,  Rhythm:Regular Rate:Normal     Neuro/Psych    GI/Hepatic   Endo/Other    Renal/GU      Musculoskeletal   Abdominal (+) + obese,   Peds  Hematology   Anesthesia Other Findings   Reproductive/Obstetrics                             Anesthesia Physical Anesthesia Plan  ASA: III  Anesthesia Plan: General   Post-op Pain Management:    Induction: Intravenous  PONV Risk Score and Plan: Ondansetron and Dexamethasone  Airway Management Planned: LMA  Additional Equipment:   Intra-op Plan:   Post-operative Plan:   Informed Consent: I have reviewed the patients History and Physical, chart, labs and discussed the procedure including the risks, benefits and alternatives for the proposed anesthesia with the patient or authorized representative who has indicated his/her understanding and acceptance.     Dental advisory given  Plan Discussed with: CRNA and Anesthesiologist  Anesthesia Plan Comments:         Anesthesia Quick Evaluation

## 2019-06-27 NOTE — Plan of Care (Signed)
  Problem: Health Behavior/Discharge Planning: Goal: Ability to manage health-related needs will improve Outcome: Progressing   

## 2019-06-27 NOTE — Progress Notes (Signed)
   Vital Signs MEWS/VS Documentation      06/27/2019 0823 06/27/2019 0828 06/27/2019 1241 06/27/2019 1251   MEWS Score:  0  1  3  0   MEWS Score Color:  Green  Green  Yellow  Green   Resp:  -  15  -  18   Pulse:  -  (!) 102  -  90   BP:  (!) 144/106  (!) 144/106  -  (!) 177/78   Temp:  -  98.5 F (36.9 C)  -  98.1 F (36.7 C)   O2 Device:  -  Nasal Cannula  -  Nasal Cannula   O2 Flow Rate (L/min):  -  2 L/min  -  2 L/min       Per CCMD, pt had a nonsustained sinus tach of 141. Pt was asymptomatic. Vitals were taken: current hr 90. Pt is currently lying in bed, spouse at bedside, call bell within reach.     Daelen Belvedere L Soledad Budreau 06/27/2019,12:55 PM

## 2019-06-27 NOTE — Progress Notes (Signed)
   Vital Signs MEWS/VS Documentation      06/27/2019 0828 06/27/2019 1241 06/27/2019 1251 06/27/2019 1455   MEWS Score:  1  3  0  2   MEWS Score Color:  Green  Yellow  Green  Yellow   Resp:  15  -  18  13   Pulse:  (!) 102  -  90  (!) 106   BP:  (!) 144/106  -  (!) 177/78  (!) 152/71   Temp:  98.5 F (36.9 C)  -  98.1 F (36.7 C)  98.4 F (36.9 C)   O2 Device:  Nasal Cannula  -  Nasal Cannula  Nasal Cannula   O2 Flow Rate (L/min):  2 L/min  -  2 L/min  2 L/min       Heart rate change is nonacute. Pt asymptomatic. Husband at bedside, call bell within reach. Pt remains NPO.    Horris Latino 06/27/2019,3:00 PM

## 2019-06-27 NOTE — Op Note (Signed)
Procedure: 1.  Cystoscopy with left retrograde pyelogram and interpretation. 2.  Left ureteroscopy with biopsy of left ureteral neoplasm. 3.  Insertion of left double-J stent.  Preop diagnosis: Hydronephrosis and a solitary left kidney with AK I and a possible left distal ureteral stone.  Postop diagnosis: Hydronephrosis and a solitary left kidney secondary to a left mid ureteral neoplasm.  Surgeon: Dr. Irine Seal.  Anesthesia: General.  Specimen: Biopsy of left ureteral neoplasm and cytologies from ureteral and bladder washings.  Drain: 6 [place] by 24 cm left contour double-J stent.  EBL: Minimal.  Complications: None.  Indications: [name] is a 74 year old female who has had a prior right nephroureterectomy for urothelial neoplasm who was admitted to [facility] and found to have an elevated creatinine of 2.2 with hydronephrosis on CT.  The cause of obstruction was not entirely clear.  There were several calcifications in the pelvis which were primarily phleboliths but there was one that was suggestive of a left distal ureteral stone although the ureter was not dilated above this and was primarily dilated above the mid ureter.  It was felt that cystoscopy with retrograde pyelography and possible ureteroscopy was indicated.  Procedure: She was given 2 g of Ancef.  She was taken operating room where general anesthetic was induced.  She was placed in lithotomy position and fitted with PAS hose.  Her perineum and genitalia were prepped with Betadine solution she was draped in usual sterile fashion.  Cystoscopy was performed using a 55 [place] scope with a 30 degree lens.  Examination of bladder revealed an absent right ureter and a normal left ureteral orifice.  The bladder wall had mild trabeculation without tumors, stones or inflammation.  The left retrograde pyelogram was performed with a 5 [place] open-ended catheter and Omnipaque.  Initial left retrograde pyelogram revealed a normal  caliber left intramural ureter and there was a small filling defect above this which was suggestive of the stone and there was proximal dilation.  At this point I did not continue the retrograde to the kidney however subsequent completion of the retrograde demonstrated hydronephrosis without any intrarenal filling defects or filling defects in the proximal ureter but a filling defect below the iliac consistent with the urothelial neoplasm identified on subsequent ureteroscopy as described below.  A guidewire was passed through the open-ended catheter to the kidney and the opening catheter and cystoscope were removed.  A 12 French digital access sheath inner core was then used to dilate the distal ureter and a 6.5 French dual-lumen semirigid ureteroscope was passed alongside the wire.  Despite the filling defect initially seen on retrograde pyelography the stone was not identified.  The ureteroscope was then advanced into the mid and subsequently the proximal ureter and she was noted to have fairly extensive urothelial neoplasm of the mid ureter that appeared to be approximately 3 to 5 cm in length and was circumferential.  I was able to advance the ureteroscope proximal to the lesion and another small papillary lesion was noted in the proximal ureter.  There was ureteral valves more proximally and I was not able to advance the scope to the kidney.  Once the ureter ureteral neoplasm was identified an engage basket was used to retrieve a few small fragments for pathologic analysis.  Some bleeding occurred during the biopsy which obscured the lesion and I felt that because of the extent of the lesion I would not be able to completely fulgurated with the laser and the current environment.  I  removed the ureteroscope and reinserted the cystoscope alongside the wire.  The bladder was then evacuated into 2 specimen cups which will be sent for cytology as significant cellular material was disrupted during ureteroscopy  and I felt that more specimen for pathology would be worthwhile.  The cystoscope was then reinserted over the wire and a 6 [place] by 24 cm contour double-J stent was advanced to the kidney under fluoroscopic guidance.  The wire was removed leaving a good coil in the kidney and a good coil in the bladder.  The bladder was then drained and the cystoscope was removed.  She was taken down from lithotomy position, her anesthetic was reversed and she was moved recovery in stable condition.  There were no complications..  A guidewire was passed

## 2019-06-27 NOTE — Anesthesia Procedure Notes (Signed)
Procedure Name: Intubation Date/Time: 06/27/2019 4:58 PM Performed by: Adalberto Ill, CRNA Pre-anesthesia Checklist: Patient identified, Emergency Drugs available, Suction available, Patient being monitored and Timeout performed Patient Re-evaluated:Patient Re-evaluated prior to induction Oxygen Delivery Method: Circle system utilized Preoxygenation: Pre-oxygenation with 100% oxygen Induction Type: IV induction LMA: LMA with gastric port inserted LMA Size: 3.0 Number of attempts: 1 Placement Confirmation: ETT inserted through vocal cords under direct vision,  positive ETCO2 and breath sounds checked- equal and bilateral ETT to lip (cm): yes. Tube secured with: Tape Dental Injury: Teeth and Oropharynx as per pre-operative assessment

## 2019-06-27 NOTE — Progress Notes (Signed)
RN went to pharmacy to pick up pt's home medication from pharmacy and given to patient. Husband will take the medication home with patient's permission.

## 2019-06-27 NOTE — Progress Notes (Signed)
Pt. picked up by [facility]. Bedside nurse gave report to receiving Judson Roch, RN. Pt's husband took pt's belongings home: cell phone, clothes, shoes, bottle of medication, suitcase, purse/wallet. Questions and concerns were answered.

## 2019-06-27 NOTE — Care Management Important Message (Signed)
Important Message  Patient Details  Name: [name] MRN: [id number] Date of Birth: 04/15/1946   Medicare Important Message Given:  Yes     Shelda Altes 06/27/2019, 1:05 PM

## 2019-06-27 NOTE — Interval H&P Note (Signed)
History and Physical Interval Note:  Her Cr remains elevated and she has left hydro with a possible tiny left distal stone.  I discussed the need for cystoscopy with RTG and possible ureteroscopy and reviewed the risks of the procedure.    06/27/2019 4:33 PM  [name]  has presented today for surgery, with the diagnosis of left ureteral obstructioan stone.  The various methods of treatment have been discussed with the patient and family. After consideration of risks, benefits and other options for treatment, the patient has consented to  Procedure(s): CYSTOSCOPY WITH LEFT RETROGRADE PYELOGRAM, URETEROSCOPY AND STENT POSSIBLE LASER, POSSIBLE BIOPSY AND LEFT STENT PLACEMENT (Left) as a surgical intervention.  The patient's history has been reviewed, patient examined, no change in status, stable for surgery.  I have reviewed the patient's chart and labs.  Questions were answered to the patient's satisfaction.     Irine Seal

## 2019-06-27 NOTE — Progress Notes (Signed)
PROGRESS NOTE    [name]  [id number] DOB: [birth year] DOA: 06/24/2019 PCP: [name], MD    Brief Narrative:   [name] is a 73 year old female with PMHx significant for hypertension, hyperlipidemia, renal cell carcinoma status post right nephrectomy (follows with [name]), IBS who was referred to the ED by her PCP given worsening renal function with creatinine noted at 2.3 (baseline 1.1).  Patient reports some diarrhea.   Renal ultrasound did show left kidney hydronephrosis, with subsequent renal stone, showing possible mass/hemorrhage in the ureter. Urology consulted and EDP requested hospital service for admission.  Assessment & Plan:   Principal Problem:   AKI (acute kidney injury) ([place]) Active Problems:   Hypertension   Hyperlipidemia   Depression   Hypokalemia   Hypomagnesemia   Bilateral leg edema   Diarrhea  Acute renal failure Hydronephrosis, left kidney mild Patient presenting from her PCP office following routine labs with noted elevated creatinine of 2.3 with a baseline of 1.1.  Renal ultrasound significant for hydronephrosis of solitary left kidney.  CT Abd stone study noted mild left hydroureteronephrosis to the level of the pelvis with appearance of a soft tissue density within the ureter consistent with hemorrhage versus soft tissue mass such as urothelial malignancy. --Urology consulted and following, appreciate assistance --Cr 2.54-->2.41-->2.27-->2.37-->2.20-->2.03-->2.01 --Urine output1,714mL past 24h --Plan for transfer to [place] today for cystoscopy --NPO; pending surgical intervention --Avoid nephrotoxins, renally dose all medications --Daily BMP --Closely monitor urinary output with strict I's and O's  Acute hypoxic respiratory failure Developed hypoxia following aggressive IV resuscitation during initial presentation.  Chest x-ray notable for pulmonary edema as well as lower extremity edema. --s/p IV furosemide on  9/17 --Now titrated back to room air --Continue to monitor respiratory status closely  Hypokalemia Hypomagnesemia K 2.8 and mg 1.6 this morning.  Will replete. --Follow electrolytes daily  Hyperlipidemia: Continue simvastatin 20 mg p.o. daily  Chronic diarrhea Hx IBS Patient w/ history of chronic diarrhea; not on treatment outpatient.  Follows with gastroenterology, Dr. [name].  No infectious etiology as patient is afebrile without leukocytosis. --Continue to monitor closely, outpatient follow-up.  Essential hypertension Not optimally controlled.  Not on antihypertensive therapy outpatient. --Continue amlodipine 10 mg p.o. daily --Increase hydralazine from 25 mg to 50mg  PO TID --Hydralazine 5 mg IV prn --Continue to monitor closely and titrate antihypertensives as needed  Depression: Continue sertraline 50 mg p.o. daily, trazodone 100 mg p.o. nightly   DVT prophylaxis: Heparin Code Status: Full code Family Communication: None present at bedside Disposition Plan: Transfer to [place] long commute hospital for acute surgical intervention by urology today   Consultants:   Urology, Dr. Eulogio Ditch  Procedures:   none  Antimicrobials:   none   Subjective: Patient seen and examined at bedside, awaiting transfer to [facility] for acute surgical intervention by urology.  No specific complaints this morning.  Denies headache, no visual changes, no dizziness, no chest pain, no palpitations, no shortness of breath, no nausea cefonicid diarrhea, no fever/chills/night sweats, no abdominal pain, no weakness, no paresthesias.  No acute events overnight per nursing staff.  Objective: Vitals:   06/27/19 0419 06/27/19 0823 06/27/19 0828 06/27/19 1251  BP:  (!) 144/106 (!) 144/106 (!) 177/78  Pulse:   (!) 102 90  Resp:   15 18  Temp:   98.5 F (36.9 C) 98.1 F (36.7 C)  TempSrc:   Oral Oral  SpO2:   97% 98%  Weight: 87.6 kg     Height:  Intake/Output Summary (Last 24  hours) at 06/27/2019 1323 Last data filed at 06/27/2019 0600 Gross per 24 hour  Intake 1100 ml  Output 1750 ml  Net -650 ml   Filed Weights   06/25/19 1833 06/26/19 0507 06/27/19 0419  Weight: 87 kg 87.7 kg 87.6 kg    Examination:  General exam: Appears calm and comfortable  Respiratory system: Clear to auscultation. Respiratory effort normal. Cardiovascular system: S1 & S2 heard, RRR. No JVD, murmurs, rubs, gallops or clicks.  Trace to 1+ bilateral pitting pedal edema. Gastrointestinal system: Abdomen is nondistended, soft and nontender. No organomegaly or masses felt. Normal bowel sounds heard. Central nervous system: Alert and oriented. No focal neurological deficits. Extremities: Symmetric 5 x 5 power. Skin: No rashes, lesions or ulcers Psychiatry: Judgement and insight appear normal. Mood & affect appropriate.     Data Reviewed: I have personally reviewed following labs and imaging studies  CBC: Recent Labs  Lab 06/24/19 1848 06/25/19 0622 06/26/19 0535 06/27/19 0422  WBC 9.2 9.8 11.9* 10.7*  NEUTROABS 5.8  --  8.6*  --   HGB 11.2* 11.0* 10.4* 10.0*  HCT 32.5* 31.8* 31.7* 30.2*  MCV 91.5 91.1 93.5 94.4  PLT 223 214 227 [id number]   Basic Metabolic Panel: Recent Labs  Lab 06/24/19 2312 06/25/19 0622 06/25/19 1400 06/25/19 2035 06/26/19 0535 06/26/19 1759 06/27/19 0422 06/27/19 0734  NA  --  144 145 144 144 142 143  --   K  --  <2.0* 2.3* 2.4* 2.8* 3.6 2.8*  --   CL  --  97* 100 102 103 104 102  --   CO2  --  33* 30 30 28 28 29   --   GLUCOSE  --  116* 120* 111* 112* 153* 100*  --   BUN  --  18 17 17 14 13 11   --   CREATININE  --  2.41* 2.27* 2.37* 2.20* 2.03* 2.01*  --   CALCIUM  --  7.5* 7.8* 7.6* 7.4* 7.8* 8.2*  --   MG 1.2* 2.7*  --   --  2.0  --   --  1.6*   GFR: Estimated Creatinine Clearance: 27 mL/min (A) (by C-G formula based on SCr of 2.01 mg/dL (H)). Liver Function Tests: Recent Labs  Lab 06/24/19 1848  AST 27  ALT 17  ALKPHOS 50  BILITOT 0.7   PROT 6.7  ALBUMIN 3.7   No results for input(s): LIPASE, AMYLASE in the last 168 hours. No results for input(s): AMMONIA in the last 168 hours. Coagulation Profile: No results for input(s): INR, PROTIME in the last 168 hours. Cardiac Enzymes: No results for input(s): CKTOTAL, CKMB, CKMBINDEX, TROPONINI in the last 168 hours. BNP (last 3 results) No results for input(s): PROBNP in the last 8760 hours. HbA1C: No results for input(s): HGBA1C in the last 72 hours. CBG: No results for input(s): GLUCAP in the last 168 hours. Lipid Profile: No results for input(s): CHOL, HDL, LDLCALC, TRIG, CHOLHDL, LDLDIRECT in the last 72 hours. Thyroid Function Tests: No results for input(s): TSH, T4TOTAL, FREET4, T3FREE, THYROIDAB in the last 72 hours. Anemia Panel: No results for input(s): VITAMINB12, FOLATE, FERRITIN, TIBC, IRON, RETICCTPCT in the last 72 hours. Sepsis Labs: No results for input(s): PROCALCITON, LATICACIDVEN in the last 168 hours.  Recent Results (from the past 240 hour(s))  SARS CORONAVIRUS 2 (TAT 6-24 HRS) Nasopharyngeal Nasopharyngeal Swab     Status: None   Collection Time: 06/25/19  4:12 AM   Specimen: Nasopharyngeal  Swab  Result Value Ref Range Status   SARS Coronavirus 2 NEGATIVE NEGATIVE Final    Comment: (NOTE) SARS-CoV-2 target nucleic acids are NOT DETECTED. The SARS-CoV-2 RNA is generally detectable in upper and lower respiratory specimens during the acute phase of infection. Negative results do not preclude SARS-CoV-2 infection, do not rule out co-infections with other pathogens, and should not be used as the sole basis for treatment or other patient management decisions. Negative results must be combined with clinical observations, patient history, and epidemiological information. The expected result is Negative. Fact Sheet for Patients: SugarRoll.be Fact Sheet for Healthcare Providers: https://www.woods-mathews.com/  This test is not yet approved or cleared by the [place] FDA and  has been authorized for detection and/or diagnosis of SARS-CoV-2 by FDA under an Emergency Use Authorization (EUA). This EUA will remain  in effect (meaning this test can be used) for the duration of the COVID-19 declaration under Section 56 4(b)(1) of the Act, 21 U.S.C. section 360bbb-3(b)(1), unless the authorization is terminated or revoked sooner. Performed at [facility], [place] [address], [place], [place] [address]          Radiology Studies: Dg Chest [place] 1 View  Result Date: 06/26/2019 CLINICAL DATA:  73 year old female with hypoxia EXAM: PORTABLE CHEST 1 VIEW COMPARISON:  Chest radiograph dated 06/25/2019 FINDINGS: Prominence of the interstitial markings primarily involving the mid to lower lung field compared to the prior radiograph may represent interval development of mild edema although pneumonia is not excluded. Clinical correlation is recommended. No focal consolidation, pleural effusion, pneumothorax. Stable cardiac silhouette. Atherosclerotic calcification of the aorta. No acute osseous pathology. IMPRESSION: Findings may represent interval development of mild edema although pneumonia is not excluded. Electronically Signed   By: Anner Crete M.D.   On: 06/26/2019 12:33   Dg Chest Port 1 View  Result Date: 06/25/2019 CLINICAL DATA:  Dyspnea EXAM: PORTABLE CHEST 1 VIEW COMPARISON:  08/24/2009, 06/25/2019 FINDINGS: The heart size is mildly enlarged, stable compared to prior CT. Calcific aortic knob. No focal airspace consolidation. No pleural effusion or pneumothorax. IMPRESSION: No acute cardiopulmonary findings. Electronically Signed   By: Davina Poke M.D.   On: 06/25/2019 13:44        Scheduled Meds: . amLODipine  10 mg Oral Daily  . calcium carbonate  1 tablet Oral Daily  . [START ON 06/30/2019] ceFAZolin  1 g Other To OR  . cholecalciferol  1,000 Units Oral Daily  . heparin  injection (subcutaneous)  5,000 Units Subcutaneous Q8H  . hydrALAZINE  25 mg Oral Q8H  . sertraline  50 mg Oral Daily  . simvastatin  20 mg Oral Daily  . traZODone  100 mg Oral QHS   Continuous Infusions: . sodium chloride 250 mL (06/27/19 1013)     LOS: 2 days    Time spent: 39 minutes spent on chart review, discussion with nursing staff, consultants, personally reviewing all imaging studies and labs, updating family and interview/physical exam, coordinating transfer; more than 50% of that time was spent in counseling and/or coordination of care.    Jaspal Pultz J [place] DO [facility] Pager [phone]  If 7PM-7AM, please contact night-coverage [email or web address] Password [email or web address] 06/27/2019, 1:23 PM

## 2019-06-28 ENCOUNTER — Inpatient Hospital Stay (HOSPITAL_COMMUNITY): Payer: Medicare PPO

## 2019-06-28 DIAGNOSIS — I509 Heart failure, unspecified: Secondary | ICD-10-CM

## 2019-06-28 DIAGNOSIS — E785 Hyperlipidemia, unspecified: Secondary | ICD-10-CM

## 2019-06-28 DIAGNOSIS — I5021 Acute systolic (congestive) heart failure: Secondary | ICD-10-CM | POA: Clinically undetermined

## 2019-06-28 DIAGNOSIS — R778 Other specified abnormalities of plasma proteins: Secondary | ICD-10-CM

## 2019-06-28 DIAGNOSIS — F329 Major depressive disorder, single episode, unspecified: Secondary | ICD-10-CM

## 2019-06-28 DIAGNOSIS — I361 Nonrheumatic tricuspid (valve) insufficiency: Secondary | ICD-10-CM

## 2019-06-28 DIAGNOSIS — I371 Nonrheumatic pulmonary valve insufficiency: Secondary | ICD-10-CM

## 2019-06-28 DIAGNOSIS — R0902 Hypoxemia: Secondary | ICD-10-CM | POA: Clinically undetermined

## 2019-06-28 DIAGNOSIS — J9601 Acute respiratory failure with hypoxia: Secondary | ICD-10-CM

## 2019-06-28 LAB — CBC
HCT: 30.5 % — ABNORMAL LOW (ref 36.0–46.0)
Hemoglobin: 9.2 g/dL — ABNORMAL LOW (ref 12.0–15.0)
MCH: 30.4 pg (ref 26.0–34.0)
MCHC: 30.2 g/dL (ref 30.0–36.0)
MCV: 100.7 fL — ABNORMAL HIGH (ref 80.0–100.0)
Platelets: 220 10*3/uL (ref 150–400)
RBC: 3.03 MIL/uL — ABNORMAL LOW (ref 3.87–5.11)
RDW: 15.9 % — ABNORMAL HIGH (ref 11.5–15.5)
WBC: 12.2 10*3/uL — ABNORMAL HIGH (ref 4.0–10.5)
nRBC: 0 % (ref 0.0–0.2)

## 2019-06-28 LAB — TROPONIN I (HIGH SENSITIVITY)
Troponin I (High Sensitivity): 865 ng/L (ref <18)
Troponin I (High Sensitivity): 1308 ng/L (ref <18)

## 2019-06-28 LAB — BASIC METABOLIC PANEL
Anion gap: 15 (ref 5–15)
BUN: 15 mg/dL (ref 8–23)
CO2: 26 mmol/L (ref 22–32)
Calcium: 8.5 mg/dL — ABNORMAL LOW (ref 8.9–10.3)
Chloride: 100 mmol/L (ref 98–111)
Creatinine, Ser: 1.86 mg/dL — ABNORMAL HIGH (ref 0.44–1.00)
GFR calc Af Amer: 31 mL/min — ABNORMAL LOW (ref >60)
GFR calc non Af Amer: 27 mL/min — ABNORMAL LOW (ref >60)
Glucose, Bld: 132 mg/dL — ABNORMAL HIGH (ref 70–99)
Potassium: 3.3 mmol/L — ABNORMAL LOW (ref 3.5–5.1)
Sodium: 141 mmol/L (ref 135–145)

## 2019-06-28 LAB — HEPARIN LEVEL (UNFRACTIONATED): Heparin Unfractionated: 0.23 IU/mL — ABNORMAL LOW (ref 0.30–0.70)

## 2019-06-28 LAB — GLUCOSE, CAPILLARY: Glucose-Capillary: 126 mg/dL — ABNORMAL HIGH (ref 70–99)

## 2019-06-28 LAB — ECHOCARDIOGRAM COMPLETE
Height: 63 in
Weight: 3115.2 oz

## 2019-06-28 LAB — STREP PNEUMONIAE URINARY ANTIGEN: Strep Pneumo Urinary Antigen: NEGATIVE

## 2019-06-28 LAB — MAGNESIUM: Magnesium: 2.2 mg/dL (ref 1.7–2.4)

## 2019-06-28 MED ORDER — SODIUM CHLORIDE 0.9 % IV SOLN
2.0000 g | INTRAVENOUS | Status: DC
  Administered 2019-06-28 – 2019-06-29 (×2): 2 g via INTRAVENOUS
  Filled 2019-06-28: qty 20
  Filled 2019-06-28 (×2): qty 2

## 2019-06-28 MED ORDER — POTASSIUM CHLORIDE CRYS ER 20 MEQ PO TBCR
40.0000 meq | EXTENDED_RELEASE_TABLET | Freq: Once | ORAL | Status: AC
  Administered 2019-06-28: 40 meq via ORAL
  Filled 2019-06-28: qty 2

## 2019-06-28 MED ORDER — HEPARIN (PORCINE) 25000 UT/250ML-% IV SOLN: 1000.0000 [IU]/h | INTRAVENOUS | Status: DC

## 2019-06-28 MED ORDER — SODIUM CHLORIDE 0.9 % IV SOLN
500.0000 mg | INTRAVENOUS | Status: DC
  Administered 2019-06-28 – 2019-06-29 (×2): 500 mg via INTRAVENOUS
  Filled 2019-06-28 (×3): qty 500

## 2019-06-28 MED ORDER — FUROSEMIDE 10 MG/ML IJ SOLN
40.0000 mg | Freq: Every day | INTRAMUSCULAR | Status: DC
  Administered 2019-06-28 – 2019-07-02 (×5): 40 mg via INTRAVENOUS
  Filled 2019-06-28 (×5): qty 4

## 2019-06-28 MED ORDER — HEPARIN (PORCINE) 25000 UT/250ML-% IV SOLN
850.0000 [IU]/h | INTRAVENOUS | Status: DC
  Administered 2019-06-28: 850 [IU]/h via INTRAVENOUS
  Filled 2019-06-28 (×2): qty 250

## 2019-06-28 NOTE — Progress Notes (Addendum)
1 Day Post-Op Subjective: S/p diagnostic left ureteroscopy and left ureteral stent placement on 06/27/2019.  Patient reports improving hematuria.  Pain is well controlled.  Objective: Vital signs in last 24 hours: Temp:  [97.5 F (36.4 C)-98.9 F (37.2 C)] 97.7 F (36.5 C) (09/19 1350) Pulse Rate:  [79-106] 88 (09/19 1350) Resp:  [12-20] 18 (09/19 1350) BP: (121-185)/(66-75) 131/75 (09/19 1350) SpO2:  [88 %-98 %] 96 % (09/19 1350) Weight:  [87.6 kg-88.3 kg] 88.3 kg (09/19 0937)  Intake/Output from previous day: 09/18 0701 - 09/19 0700 In: 785.9 [P.O.:240; I.V.:7.6; IV Piggyback:538.3] Out: 2100 [Urine:2100] Intake/Output this shift: Total I/O In: 600 [P.O.:600] Out: 2425 [Urine:2425]  Physical Exam:  Constitutional: No acute distress Eyes: PERRL, No scleral icterus.   Cardiovascular: RRR Pulmonary/Chest: Normal respiratory effort GI: Soft, nontender, nondistended.   Lab Results: Recent Labs    06/26/19 0535 06/27/19 0422 06/28/19 0350  HGB 10.4* 10.0* 9.2*  HCT 31.7* 30.2* 30.5*   BMET Recent Labs    06/27/19 0422 06/27/19 1250 06/28/19 0350  NA 143  --  141  K 2.8* 3.0* 3.3*  CL 102  --  100  CO2 29  --  26  GLUCOSE 100*  --  132*  BUN 11  --  15  CREATININE 2.01*  --  1.86*  CALCIUM 8.2*  --  8.5*   No results for input(s): LABPT, INR in the last 72 hours. No results for input(s): LABURIN in the last 72 hours. Results for orders placed or performed during the hospital encounter of 06/24/19  SARS CORONAVIRUS 2 (TAT 6-24 HRS) Nasopharyngeal Nasopharyngeal Swab     Status: None   Collection Time: 06/25/19  4:12 AM   Specimen: Nasopharyngeal Swab  Result Value Ref Range Status   SARS Coronavirus 2 NEGATIVE NEGATIVE Final    Comment: (NOTE) SARS-CoV-2 target nucleic acids are NOT DETECTED. The SARS-CoV-2 RNA is generally detectable in upper and lower respiratory specimens during the acute phase of infection. Negative results do not preclude SARS-CoV-2  infection, do not rule out co-infections with other pathogens, and should not be used as the sole basis for treatment or other patient management decisions. Negative results must be combined with clinical observations, patient history, and epidemiological information. The expected result is Negative. Fact Sheet for Patients: SugarRoll.be Fact Sheet for Healthcare Providers: https://www.woods-mathews.com/ This test is not yet approved or cleared by the [place] FDA and  has been authorized for detection and/or diagnosis of SARS-CoV-2 by FDA under an Emergency Use Authorization (EUA). This EUA will remain  in effect (meaning this test can be used) for the duration of the COVID-19 declaration under Section 56 4(b)(1) of the Act, 21 U.S.C. section 360bbb-3(b)(1), unless the authorization is terminated or revoked sooner. Performed at [facility], [place] [address], [place], [place] [address]     Studies/Results: Dg Chest Port 1 View  Result Date: 06/28/2019 CLINICAL DATA:  Shortness of breath this morning post renal stent placement 1 day prior. EXAM: PORTABLE CHEST 1 VIEW COMPARISON:  Chest x-rays dated 06/26/2019 06/25/2019. FINDINGS: Increasing opacity at the medial aspects of the RIGHT lung base. Mildly prominent interstitial markings bilaterally. Stable cardiomegaly. Stable elevation of the RIGHT hemidiaphragm no pneumothorax seen. IMPRESSION: 1. Increasing opacity at the medial aspects of the RIGHT lung base, suspicious for pneumonia, alternatively asymmetric pulmonary edema. 2. Stable cardiomegaly. 3. Mildly prominent interstitial markings suggesting mild interstitial edema versus chronic interstitial lung disease. Electronically Signed   By: Roxy Horseman.D.  On: 06/28/2019 09:00   Dg C-arm 1-60 Min-no Report  Result Date: 06/27/2019 Fluoroscopy was utilized by the requesting physician.  No radiographic interpretation.     Assessment/Plan: 73 year old female now S/p diagnostic left ureteroscopy and left ureteral stent placement on 06/27/2019.  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  Biopsy was performed.  Results of intraoperative findings discussed with patient on morning rounds as well as with patient's spouse last night.   She will likely need to undergo procedure for laser ablation of ureteral tumor soon.  This will be the best next step for management of her disease given solitary kidney. This will be arranged by urology team.  From a urologic standpoint, she is stable for discharge.  However she had elevated troponins on 9/19 and is currently undergoing cardiopulmonary work-up.  We will defer discharge timing to primary team and their evaluation of comorbidities.   LOS: 3 days   Haskel Schroeder 06/28/2019, 2:44 PM  I have seen and examined the patient and concur with the assessment and plan.

## 2019-06-28 NOTE — Progress Notes (Signed)
[place] for IV heparin Indication: elevated troponin, r/o ACS  Allergies  Allergen Reactions  . Erythromycin Diarrhea and Nausea And Vomiting  . Other     Unknown blood pressure pill caused severe coughing    Patient Measurements: Height: 5\' 3"  (160 cm) Weight: 194 lb 11.2 oz (88.3 kg) IBW/kg (Calculated) : 52.4 Heparin Dosing Weight: 72 kg  Vital Signs: Temp: 97.9 F (36.6 C) (09/19 0937) Temp Source: Oral (09/19 0937) BP: 134/73 (09/19 0937) Pulse Rate: 94 (09/19 0937)  Labs: Recent Labs    06/26/19 0535 06/26/19 1759 06/27/19 0422 06/28/19 0350 06/28/19 0947  HGB 10.4*  --  10.0* 9.2*  --   HCT 31.7*  --  30.2* 30.5*  --   PLT 227  --  212 220  --   CREATININE 2.20* 2.03* 2.01* 1.86*  --   TROPONINIHS  --   --   --   --  865*    Estimated Creatinine Clearance: 29.3 mL/min (A) (by C-G formula based on SCr of 1.86 mg/dL (H)).   Medical History: Past Medical History:  Diagnosis Date  . Anxiety   . Cancer ([place])   . Depression   . History of kidney cancer   . Hyperlipidemia   . Hypertension     Medications:  Medications Prior to Admission  Medication Sig Dispense Refill Last Dose  . acetaminophen (TYLENOL) 500 MG tablet Take 1,000 mg by mouth every 6 (six) hours as needed for headache.   Past Week at Unknown time  . calcium carbonate 200 MG capsule Take 250 mg by mouth daily.    06/24/2019 at Unknown time  . cholecalciferol (VITAMIN D) 1000 UNITS tablet Take 1,000 Units by mouth daily.   06/24/2019 at Unknown time  . sertraline (ZOLOFT) 50 MG tablet Take 50 mg by mouth daily.   06/24/2019 at Unknown time  . simvastatin (ZOCOR) 20 MG tablet Take 20 mg by mouth daily.   06/24/2019 at Unknown time  . traZODone (DESYREL) 50 MG tablet Take 100 mg by mouth at bedtime.   Past Week at Unknown time  . cephALEXin (KEFLEX) 500 MG capsule Take 1 capsule (500 mg total) by mouth 3 (three) times daily. (Patient not taking: Reported on  06/25/2019) 30 capsule 0 Not Taking at Unknown time  . HYDROcodone-acetaminophen (NORCO/VICODIN) 5-325 MG tablet Take 1 tablet by mouth every 6 (six) hours as needed for severe pain. (Patient not taking: Reported on 06/25/2019) 10 tablet 0 Not Taking at Unknown time  . Tavaborole (KERYDIN) 5 % SOLN Apply 1 drop each affected nail once daily for 12 months duration (Patient not taking: Reported on 06/25/2019) 10 mL 11 Not Taking   Scheduled:  . amLODipine  10 mg Oral Daily  . calcium carbonate  1 tablet Oral Daily  . cholecalciferol  1,000 Units Oral Daily  . furosemide  40 mg Intravenous Daily  . hydrALAZINE  25 mg Oral Q8H  . sertraline  50 mg Oral Daily  . simvastatin  20 mg Oral Daily  . traZODone  100 mg Oral QHS    Assessment: 64 yoF with PMH HTN, HLD, RCC s/p R nephrectomy, IBS, admitted for worsening renal function. On 9/19, noted to have elevated troponin at 0.87. Pharmacy consulted to start heparin for ACS r/o. Note patient underwent cystoscopy with biopsy of ureteral mass and stent insertion.   Baseline INR, aPTT: not done, patient already on SQ heparin  Prior anticoagulation: none, ppx with SQ heparin  while admitted  Significant events:  Today, 06/28/2019:  CBC: Hgb low and continues to trend downward but still > 7.0; Plt stable WNL  No bleeding or infusion issues per nursing  Goal of Therapy: Heparin level 0.3-0.7 units/ml Monitor platelets by anticoagulation protocol: Yes  Plan:  Stop SQ heparin  Start heparin 850 units/hr IV infusion; no bolus  Check heparin level 8 hrs after start  Daily CBC, daily heparin level once stable  Monitor for signs of bleeding or thrombosis - watch closely for new/worsening hematuria   Reuel Boom, PharmD, BCPS [phone] 06/28/2019, 12:39 PM

## 2019-06-28 NOTE — Progress Notes (Signed)
  Echocardiogram 2D Echocardiogram has been performed.  Giulia Hickey G Sandra Tellefsen 06/28/2019, 10:34 AM

## 2019-06-28 NOTE — Progress Notes (Signed)
CRITICAL VALUE ALERT  Critical Value:  Troponin 865  Date & Time Notied:  06/28/19 at 1102 am  Provider Notified: Dr. Irine Seal  Orders Received/Actions taken:

## 2019-06-28 NOTE — Transfer of Care (Signed)
Immediate Anesthesia Transfer of Care Note  Patient: [name]  Procedure(s) Performed: CYSTOSCOPY WITH LEFT RETROGRADE PYELOGRAM, URETEROSCOPY, BIOPSY AND LEFT STENT PLACEMENT (Left )  Patient Location: PACU  Anesthesia Type:General  Level of Consciousness: awake, alert , oriented and patient cooperative  Airway & Oxygen Therapy: Patient Spontanous Breathing and Patient connected to face mask oxygen  Post-op Assessment: Report given to RN and Post -op Vital signs reviewed and stable  Post vital signs: Reviewed and stable  Last Vitals:  Vitals Value Taken Time  BP 131/75 06/28/19 1350  Temp 36.5 C 06/28/19 1350  Pulse 88 06/28/19 1350  Resp 18 06/28/19 1350  SpO2 96 % 06/28/19 1350    Last Pain:  Vitals:   06/28/19 1350  TempSrc: Oral  PainSc:          Complications: No apparent anesthesia complications

## 2019-06-28 NOTE — Progress Notes (Signed)
[place] for IV heparin Indication: elevated troponin, r/o ACS  Allergies  Allergen Reactions  . Erythromycin Diarrhea and Nausea And Vomiting  . Other     Unknown blood pressure pill caused severe coughing    Patient Measurements: Height: 5\' 3"  (160 cm) Weight: 194 lb 11.2 oz (88.3 kg) IBW/kg (Calculated) : 52.4 Heparin Dosing Weight: 72 kg  Vital Signs: Temp: 98.7 F (37.1 C) (09/19 2054) Temp Source: Oral (09/19 2054) BP: 146/76 (09/19 2054) Pulse Rate: 86 (09/19 2130)  Labs: Recent Labs    06/26/19 0535 06/26/19 1759 06/27/19 0422 06/28/19 0350 06/28/19 0947 06/28/19 1146 06/28/19 2044  HGB 10.4*  --  10.0* 9.2*  --   --   --   HCT 31.7*  --  30.2* 30.5*  --   --   --   PLT 227  --  212 220  --   --   --   HEPARINUNFRC  --   --   --   --   --   --  0.23*  CREATININE 2.20* 2.03* 2.01* 1.86*  --   --   --   TROPONINIHS  --   --   --   --  865* 1,308*  --     Estimated Creatinine Clearance: 29.3 mL/min (A) (by C-G formula based on SCr of 1.86 mg/dL (H)).   Medical History: Past Medical History:  Diagnosis Date  . Anxiety   . Cancer ([place])   . Depression   . History of kidney cancer   . Hyperlipidemia   . Hypertension     Medications:  Medications Prior to Admission  Medication Sig Dispense Refill Last Dose  . acetaminophen (TYLENOL) 500 MG tablet Take 1,000 mg by mouth every 6 (six) hours as needed for headache.   Past Week at Unknown time  . calcium carbonate 200 MG capsule Take 250 mg by mouth daily.    06/24/2019 at Unknown time  . cholecalciferol (VITAMIN D) 1000 UNITS tablet Take 1,000 Units by mouth daily.   06/24/2019 at Unknown time  . sertraline (ZOLOFT) 50 MG tablet Take 50 mg by mouth daily.   06/24/2019 at Unknown time  . simvastatin (ZOCOR) 20 MG tablet Take 20 mg by mouth daily.   06/24/2019 at Unknown time  . traZODone (DESYREL) 50 MG tablet Take 100 mg by mouth at bedtime.   Past Week at Unknown time   . cephALEXin (KEFLEX) 500 MG capsule Take 1 capsule (500 mg total) by mouth 3 (three) times daily. (Patient not taking: Reported on 06/25/2019) 30 capsule 0 Not Taking at Unknown time  . HYDROcodone-acetaminophen (NORCO/VICODIN) 5-325 MG tablet Take 1 tablet by mouth every 6 (six) hours as needed for severe pain. (Patient not taking: Reported on 06/25/2019) 10 tablet 0 Not Taking at Unknown time  . Tavaborole (KERYDIN) 5 % SOLN Apply 1 drop each affected nail once daily for 12 months duration (Patient not taking: Reported on 06/25/2019) 10 mL 11 Not Taking   Scheduled:  . amLODipine  10 mg Oral Daily  . calcium carbonate  1 tablet Oral Daily  . cholecalciferol  1,000 Units Oral Daily  . furosemide  40 mg Intravenous Daily  . hydrALAZINE  25 mg Oral Q8H  . sertraline  50 mg Oral Daily  . simvastatin  20 mg Oral Daily  . traZODone  100 mg Oral QHS    Assessment: 74 yoF with PMH HTN, HLD, RCC s/p R nephrectomy, IBS, admitted  for worsening renal function. On 9/19, noted to have elevated troponin at 0.87. Pharmacy consulted to start heparin for ACS r/o. Note patient underwent cystoscopy with biopsy of ureteral mass and stent insertion.   Baseline INR, aPTT: not done, patient already on SQ heparin  Prior anticoagulation: none, ppx with SQ heparin while admitted  Significant events:  Today, 06/28/2019:  CBC: Hgb low and continues to trend downward but still > 7.0; Plt stable WNL  No bleeding or infusion issues per nursing  2044 HL=0.23 below goal, no infusion issues per RN. Pink tinged urine is noted by RN.   Goal of Therapy: Heparin level 0.3-0.7 units/ml Monitor platelets by anticoagulation protocol: Yes  Plan:  Increase heparin drip to 1000 unit/shr  Recheck HL in 8 hours  Daily CBC, daily heparin level once stable  Monitor for signs of bleeding or thrombosis - watch closely for new/worsening hematuria    Dorrene German 06/28/2019, 9:38 PM

## 2019-06-28 NOTE — Plan of Care (Signed)

## 2019-06-28 NOTE — Progress Notes (Signed)
CRITICAL VALUE ALERT  Critical Value:  Troponin 1308  Date & Time Notied:  06/28/19 at 1200 pm   Provider Notified: Dr. Irine Seal  Orders Received/Actions taken:

## 2019-06-28 NOTE — Progress Notes (Signed)
PROGRESS NOTE    [name]  [id number] DOB: 1946/04/21 DOA: 06/24/2019 PCP: [name], MD    Brief Narrative:  [name] is a 73 year old female with PMHx significant for hypertension, hyperlipidemia, renal cell carcinoma status post right nephrectomy (follows with [name]), IBS who was referred to the ED by her PCP given worsening renal function with creatinine noted at 2.3 (baseline 1.1). Patient reports some diarrhea.   Renal ultrasound did show left kidney hydronephrosis, with subsequent renal stone, showing possible mass/hemorrhage in the ureter. Urology consulted and EDP requested hospital service for admission.   Assessment & Plan:   Principal Problem:   AKI (acute kidney injury) ([name]) Active Problems:   Hypertension   Hyperlipidemia   Depression   Hypokalemia   Hypomagnesemia   Bilateral leg edema   Diarrhea  1 acute respiratory failure with hypoxia Patient early on in the hospitalization noted to have developed hypoxia following aggressive IV fluid resuscitation during initial presentation.  Chest x-ray which was done was consistent with pulmonary edema patient also noted to have lower extremity edema.  Patient received IV Lasix on 06/26/2019 with good urine output and clinical improvement and was titrated back to room air. Overnight on 06/27/2019 and early the morning of 06/28/2019 patient noted to have significant hypoxia with sats dropping in the 50s per RN with increased O2 requirements up to 6 L nasal cannula to keep sats greater than 92%.  Concern for volume overload.  Came and assessed patient and patient noted to have diffuse crackles on examination.  Patient with some trace to 1+ pedal edema.  Chest x-ray ordered consistent with volume overload and possible infiltrate.  Patient afebrile.  Patient also noted with a leukocytosis.  Cycle cardiac enzymes and first set of cardiac enzymes elevated at 865.  EKG done with slight ST depression in leads  V4 through V6.  Patient denied any ongoing chest pain.  Will check a 2D echo.  Cardiology consulted due to elevated troponin levels in the setting of hypoxia and probable acute CHF exacerbation.  Case was discussed with urology Dr. Jeffie Pollock and patient will be placed on IV heparin without a bolus as patient with recent instrumentation with biopsy.  Monitor closely for bleeding.  Place on Lasix 40 mg IV daily.  We will also follow the pneumonia pathway due to concerns for possible infiltration and check a urine Legionella antigen, urine pneumococcus antigen, blood cultures x2.  Placed empirically on IV Rocephin and azithromycin.  Strict I's and O's.  Daily weights.  Follow.  2.  Acute CHF exacerbation Patient noted to have significant hypoxia overnight and this morning requiring 6 L of nasal cannula.  Patient was not O2 dependent prior to admission.  First set of cardiac enzymes elevated.  We will continue serial cardiac enzymes.  Check a 2D echo.  Placed on Lasix 40 mg IV daily.  Strict I's and O's.  Daily weights.  Consult with cardiology for further evaluation and management.  3.  Elevated troponin Questionable etiology.  Patient with history of hypertension, hyperlipidemia, prior tobacco use.  Patient noted to have sudden onset hypoxia overnight.  Patient denied any ongoing chest pain.  EKG done in sinus rhythm with slight ST depression in leads V4 through V6.  Check a 2D echo.  Placed on IV heparin without a bolus as patient with recent cystoscopy and biopsies done.  Monitor closely for bleeding.  Discussed with urology.  Consulted cardiology for further evaluation and management.  4.  Hypokalemia/hypomagnesemia  Check a magnesium level.  Replete.  5.  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  Renal ultrasound done  concerning for hydronephrosis of solitary left kidney.  CT stone protocol was done that showed mild left hydroureteronephrosis to the level of the pelvis with appearance of soft tissue density within the ureter consistent with hemorrhage versus soft tissue mass such as a urothelial malignancy.  Urology was consulted and patient transferred to [facility] for further evaluation with cystoscopy.  Patient subsequently underwent cystoscopy with left retrograde pyelogram and interpretation, left ureteroscopy with biopsy of left ureteral neoplasm insertion of left double-J stent.  Patient with a urine output of 2.1 L over the past 24 hours.  Creatinine down to 1.86.  Monitor closely with diuresis.  Per nephrology.  6.  Hyperlipidemia Continue statin.  7.  History of IBS/chronic diarrhea Patient with history of chronic diarrhea not on treatment in the outpatient setting.  Outpatient follow-up with gastroenterology.  8.  Depression Continue home regimen of sertraline and trazodone.  9.  Hypertension Patient noted not to be on any antihypertensive medications prior to admission.  Patient started on Norvasc 10 mg daily as well as hydralazine 25 mg every 8 hours.  Patient on IV Lasix due to concerns for volume overload.  Follow.  DVT prophylaxis: Heparin Code Status: Full Family Communication: Updated patient.  No family at bedside. Disposition Plan: Likely home when clinically improved.   Consultants:   Urology: Dr. Matilde Sprang 06/25/2019  Cardiology pending  Procedures:   2D echo pending 06/28/2019  CT renal stone protocol 06/25/2019  Renal ultrasound 06/25/2019  Chest x-ray 06/25/2019, 06/26/2019, 06/27/2019, 06/28/2019  Cystoscopy with left retrograde pyelogram and interpretation, left uteroscopy with biopsy of left ureteral neoplasm/insertion of left double-J stent per Dr. Jeffie Pollock 06/27/2019  Antimicrobials:   IV Rocephin 06/28/2019  IV azithromycin 06/28/2019   Subjective: Was  called by RN that patient hypoxic overnight with sats dropping into the 5s.  Patient hypoxic this morning requiring 6 L nasal cannula.  Per RN patient with complaints of shortness of breath and chest pain however on my interview patient denies any chest pain or shortness of breath.  Patient denies any bleeding.  Ports improving hematuria.  Objective: Vitals:   06/27/19 2105 06/27/19 2301 06/28/19 0537 06/28/19 0553  BP: (!) 167/74 (!) 145/71 128/68 126/69  Pulse: 86 (!) 103  (!) 102  Resp: 16     Temp: 98.6 F (37 C)   98 F (36.7 C)  TempSrc: Oral   Oral  SpO2: 93%   98%  Weight:    88 kg  Height:        Intake/Output Summary (Last 24 hours) at 06/28/2019 0808 Last data filed at 06/28/2019 0121 Gross per 24 hour  Intake 785.85 ml  Output 2100 ml  Net -1314.15 ml   Filed Weights   06/27/19 0419 06/27/19 1617 06/28/19 0553  Weight: 87.6 kg 87.6 kg 88 kg    Examination:  General exam: Appears calm and comfortable  Respiratory system: Diffuse crackles. No wheezing, no rhonchi.  Speaking in full sentences.  Normal respiratory effort.  Cardiovascular system: S1 & S2 heard, RRR. No JVD, murmurs, rubs, gallops or clicks.  Trace pedal edema.  Gastrointestinal system: Abdomen is soft, nontender, nondistended, positive bowel sounds.  No rebound.  No guarding.  Central nervous system: Alert and oriented. No focal neurological deficits. Extremities: Symmetric 5 x 5 power. Skin: No rashes, lesions or ulcers Psychiatry: Judgement and insight appear normal. Mood & affect appropriate.     Data Reviewed: I have personally reviewed following labs and imaging studies  CBC: Recent Labs  Lab 06/24/19 1848 06/25/19 0622 06/26/19 0535 06/27/19 0422 06/28/19 0350  WBC 9.2 9.8 11.9* 10.7* 12.2*  NEUTROABS 5.8  --  8.6*  --   --   HGB 11.2* 11.0* 10.4* 10.0* 9.2*  HCT 32.5* 31.8* 31.7* 30.2* 30.5*  MCV 91.5 91.1 93.5 94.4 100.7*  PLT 223 214 227 212 [id number]   Basic Metabolic  Panel: Recent Labs  Lab 06/24/19 2312 06/25/19 0622  06/25/19 2035 06/26/19 0535 06/26/19 1759 06/27/19 0422 06/27/19 0734 06/27/19 1250 06/28/19 0350  NA  --  144   < > 144 144 142 143  --   --  141  K  --  <2.0*   < > 2.4* 2.8* 3.6 2.8*  --  3.0* 3.3*  CL  --  97*   < > 102 103 104 102  --   --  100  CO2  --  33*   < > 30 28 28 29   --   --  26  GLUCOSE  --  116*   < > 111* 112* 153* 100*  --   --  132*  BUN  --  18   < > 17 14 13 11   --   --  15  CREATININE  --  2.41*   < > 2.37* 2.20* 2.03* 2.01*  --   --  1.86*  CALCIUM  --  7.5*   < > 7.6* 7.4* 7.8* 8.2*  --   --  8.5*  MG 1.2* 2.7*  --   --  2.0  --   --  1.6*  --  2.2   < > = values in this interval not displayed.   GFR: Estimated Creatinine Clearance: 29.2 mL/min (A) (by C-G formula based on SCr of 1.86 mg/dL (H)). Liver Function Tests: Recent Labs  Lab 06/24/19 1848  AST 27  ALT 17  ALKPHOS 50  BILITOT 0.7  PROT 6.7  ALBUMIN 3.7   No results for input(s): LIPASE, AMYLASE in the last 168 hours. No results for input(s): AMMONIA in the last 168 hours. Coagulation Profile: No results for input(s): INR, PROTIME in the last 168 hours. Cardiac Enzymes: No results for input(s): CKTOTAL, CKMB, CKMBINDEX, TROPONINI in the last 168 hours. BNP (last 3 results) No results for input(s): PROBNP in the last 8760 hours. HbA1C: No results for input(s): HGBA1C in the last 72 hours. CBG: Recent Labs  Lab 06/28/19 0522  GLUCAP 126*   Lipid Profile: No results for input(s): CHOL, HDL, LDLCALC, TRIG, CHOLHDL, LDLDIRECT in the last 72 hours. Thyroid Function Tests: No results for input(s): TSH, T4TOTAL, FREET4, T3FREE, THYROIDAB in the last 72 hours. Anemia Panel: No results for input(s): VITAMINB12, FOLATE, FERRITIN, TIBC, IRON, RETICCTPCT in the last 72 hours. Sepsis Labs: No results for input(s): PROCALCITON, LATICACIDVEN in the last 168 hours.  Recent Results (from the past 240 hour(s))  SARS CORONAVIRUS 2 (TAT 6-24  HRS) Nasopharyngeal Nasopharyngeal Swab     Status: None   Collection Time: 06/25/19  4:12 AM   Specimen: Nasopharyngeal Swab  Result Value Ref Range Status   SARS Coronavirus 2 NEGATIVE NEGATIVE Final    Comment: (NOTE) SARS-CoV-2 target nucleic acids are NOT  DETECTED. The SARS-CoV-2 RNA is generally detectable in upper and lower respiratory specimens during the acute phase of infection. Negative results do not preclude SARS-CoV-2 infection, do not rule out co-infections with other pathogens, and should not be used as the sole basis for treatment or other patient management decisions. Negative results must be combined with clinical observations, patient history, and epidemiological information. The expected result is Negative. Fact Sheet for Patients: SugarRoll.be Fact Sheet for Healthcare Providers: https://www.woods-mathews.com/ This test is not yet approved or cleared by the [place] FDA and  has been authorized for detection and/or diagnosis of SARS-CoV-2 by FDA under an Emergency Use Authorization (EUA). This EUA will remain  in effect (meaning this test can be used) for the duration of the COVID-19 declaration under Section 56 4(b)(1) of the Act, 21 U.S.C. section 360bbb-3(b)(1), unless the authorization is terminated or revoked sooner. Performed at [facility], [place] [address] [place], [place] [address]          Radiology Studies: Dg Chest [place] 1 View  Result Date: 06/26/2019 CLINICAL DATA:  73 year old female with hypoxia EXAM: PORTABLE CHEST 1 VIEW COMPARISON:  Chest radiograph dated 06/25/2019 FINDINGS: Prominence of the interstitial markings primarily involving the mid to lower lung field compared to the prior radiograph may represent interval development of mild edema although pneumonia is not excluded. Clinical correlation is recommended. No focal consolidation, pleural effusion, pneumothorax. Stable cardiac  silhouette. Atherosclerotic calcification of the aorta. No acute osseous pathology. IMPRESSION: Findings may represent interval development of mild edema although pneumonia is not excluded. Electronically Signed   By: Anner Crete M.D.   On: 06/26/2019 12:33   Dg C-arm 1-60 Min-no Report  Result Date: 06/27/2019 Fluoroscopy was utilized by the requesting physician.  No radiographic interpretation.        Scheduled Meds:  amLODipine  10 mg Oral Daily   calcium carbonate  1 tablet Oral Daily   cholecalciferol  1,000 Units Oral Daily   heparin injection (subcutaneous)  5,000 Units Subcutaneous Q8H   hydrALAZINE  25 mg Oral Q8H   sertraline  50 mg Oral Daily   simvastatin  20 mg Oral Daily   traZODone  100 mg Oral QHS   Continuous Infusions:  sodium chloride 250 mL (06/27/19 1013)   vasopressin (PITRESSIN) infusion - *FOR SHOCK*       LOS: 3 days    Time spent: 40 minutes    Irine Seal, MD [facility]  If 7PM-7AM, please contact night-coverage [email or web address] 06/28/2019, 8:08 AM

## 2019-06-29 ENCOUNTER — Other Ambulatory Visit: Payer: Self-pay

## 2019-06-29 DIAGNOSIS — N183 Chronic kidney disease, stage 3 (moderate): Secondary | ICD-10-CM

## 2019-06-29 DIAGNOSIS — I5041 Acute combined systolic (congestive) and diastolic (congestive) heart failure: Secondary | ICD-10-CM

## 2019-06-29 DIAGNOSIS — E876 Hypokalemia: Secondary | ICD-10-CM

## 2019-06-29 LAB — COMPREHENSIVE METABOLIC PANEL
ALT: 42 U/L (ref 0–44)
AST: 47 U/L — ABNORMAL HIGH (ref 15–41)
Albumin: 3.7 g/dL (ref 3.5–5.0)
Alkaline Phosphatase: 49 U/L (ref 38–126)
Anion gap: 15 (ref 5–15)
BUN: 22 mg/dL (ref 8–23)
CO2: 30 mmol/L (ref 22–32)
Calcium: 8.7 mg/dL — ABNORMAL LOW (ref 8.9–10.3)
Chloride: 99 mmol/L (ref 98–111)
Creatinine, Ser: 1.83 mg/dL — ABNORMAL HIGH (ref 0.44–1.00)
GFR calc Af Amer: 32 mL/min — ABNORMAL LOW (ref >60)
GFR calc non Af Amer: 27 mL/min — ABNORMAL LOW (ref >60)
Glucose, Bld: 89 mg/dL (ref 70–99)
Potassium: 2.8 mmol/L — ABNORMAL LOW (ref 3.5–5.1)
Sodium: 144 mmol/L (ref 135–145)
Total Bilirubin: 0.8 mg/dL (ref 0.3–1.2)
Total Protein: 6.7 g/dL (ref 6.5–8.1)

## 2019-06-29 LAB — CBC
HCT: 29 % — ABNORMAL LOW (ref 36.0–46.0)
Hemoglobin: 9.2 g/dL — ABNORMAL LOW (ref 12.0–15.0)
MCH: 31 pg (ref 26.0–34.0)
MCHC: 31.7 g/dL (ref 30.0–36.0)
MCV: 97.6 fL (ref 80.0–100.0)
Platelets: 199 10*3/uL (ref 150–400)
RBC: 2.97 MIL/uL — ABNORMAL LOW (ref 3.87–5.11)
RDW: 15.4 % (ref 11.5–15.5)
WBC: 10.9 10*3/uL — ABNORMAL HIGH (ref 4.0–10.5)
nRBC: 0 % (ref 0.0–0.2)

## 2019-06-29 LAB — HEPARIN LEVEL (UNFRACTIONATED)
Heparin Unfractionated: 0.4 IU/mL (ref 0.30–0.70)
Heparin Unfractionated: 0.29 IU/mL — ABNORMAL LOW (ref 0.30–0.70)
Heparin Unfractionated: 0.3 IU/mL (ref 0.30–0.70)

## 2019-06-29 LAB — MAGNESIUM: Magnesium: 1.7 mg/dL (ref 1.7–2.4)

## 2019-06-29 LAB — HIV ANTIBODY (ROUTINE TESTING W REFLEX): HIV Screen 4th Generation wRfx: NONREACTIVE

## 2019-06-29 MED ORDER — MAGNESIUM SULFATE 4 GM/100ML IV SOLN
4.0000 g | Freq: Once | INTRAVENOUS | Status: AC
  Administered 2019-06-29: 4 g via INTRAVENOUS
  Filled 2019-06-29: qty 100

## 2019-06-29 MED ORDER — HEPARIN (PORCINE) 25000 UT/250ML-% IV SOLN
1100.0000 [IU]/h | INTRAVENOUS | Status: DC
  Administered 2019-06-29: 1100 [IU]/h via INTRAVENOUS
  Filled 2019-06-29: qty 250

## 2019-06-29 MED ORDER — POTASSIUM CHLORIDE CRYS ER 20 MEQ PO TBCR
40.0000 meq | EXTENDED_RELEASE_TABLET | ORAL | Status: AC
  Administered 2019-06-29 (×3): 40 meq via ORAL
  Filled 2019-06-29 (×3): qty 2

## 2019-06-29 NOTE — Progress Notes (Signed)
[place] from nuclear [facility] at [facility] [facility] called this RN and stated that patient will need to be NPO at midnight tonight, hold any blood pressure medications after midnight, and do not give patient any caffeine. Dr. Grandville Silos made aware. [facility] will need to be set up for transportation tomorrow 9/21, but per [place], [place] from nuclear med will call in the morning to discuss timing of test and then [facility] can be arranged.

## 2019-06-29 NOTE — Progress Notes (Signed)
[place] for IV heparin Indication: elevated troponin, r/o ACS  Allergies  Allergen Reactions  . Erythromycin Diarrhea and Nausea And Vomiting  . Other     Unknown blood pressure pill caused severe coughing    Patient Measurements: Height: 5\' 3"  (160 cm) Weight: 189 lb 13.1 oz (86.1 kg) IBW/kg (Calculated) : 52.4 Heparin Dosing Weight: 72 kg  Vital Signs: Temp: 97.5 F (36.4 C) (09/20 1334) Temp Source: Oral (09/20 1334) BP: 134/59 (09/20 1311) Pulse Rate: 88 (09/20 1311)  Labs: Recent Labs    06/27/19 0422 06/28/19 0350 06/28/19 0947 06/28/19 1146 06/28/19 2044 06/29/19 0544 06/29/19 1411  HGB 10.0* 9.2*  --   --   --  9.2*  --   HCT 30.2* 30.5*  --   --   --  29.0*  --   PLT 212 220  --   --   --  199  --   HEPARINUNFRC  --   --   --   --  0.23* 0.40 0.29*  CREATININE 2.01* 1.86*  --   --   --  1.83*  --   TROPONINIHS  --   --  865* 1,308*  --   --   --     Estimated Creatinine Clearance: 29.3 mL/min (A) (by C-G formula based on SCr of 1.83 mg/dL (H)).   Medical History: Past Medical History:  Diagnosis Date  . Anxiety   . Cancer ([place])   . Depression   . History of kidney cancer   . Hyperlipidemia   . Hypertension     Assessment: 18 yoF with PMH HTN, HLD, RCC s/p R nephrectomy, IBS, admitted for worsening renal function. On 9/19, noted to have elevated troponin at 0.87. Pharmacy consulted to start heparin for ACS r/o. Note patient underwent cystoscopy with biopsy of ureteral mass and stent insertion.   Baseline INR, aPTT: not done, patient already on SQ heparin  Prior anticoagulation: none, ppx with SQ heparin while admitted  Significant events:  Today, 06/29/2019:  CBC: Hgb low but stable, Plt stable WNL  No bleeding or infusion issues per nursing  Confirmatory Heparin level is slightly low at 0.29 on 1000 units/hr, no infusion issues per RN. Per urology L ureteral stent placed 9/18 and hematuria  improving on 9/19 MD note. Per RN 9/20 hematuria continues to improve  Goal of Therapy: Heparin level 0.3-0.7 units/ml Monitor platelets by anticoagulation protocol: Yes  Plan:  increase heparin drip to 1100 unit/shr  Recheck heparin level in 8 hours  Daily CBC, daily heparin level once stable  Monitor for signs of bleeding or thrombosis - watch closely for new/worsening hematuria  Eudelia Bunch, Pharm.D [phone] 06/29/2019 2:33 PM

## 2019-06-29 NOTE — Progress Notes (Signed)
[place] for IV heparin Indication: elevated troponin, r/o ACS  Allergies  Allergen Reactions  . Erythromycin Diarrhea and Nausea And Vomiting  . Other     Unknown blood pressure pill caused severe coughing    Patient Measurements: Height: 5\' 3"  (160 cm) Weight: 189 lb 13.1 oz (86.1 kg) IBW/kg (Calculated) : 52.4 Heparin Dosing Weight: 72 kg  Vital Signs: Temp: 98.8 F (37.1 C) (09/20 0535) Temp Source: Oral (09/20 0535) BP: 138/67 (09/20 0844) Pulse Rate: 88 (09/20 0535)  Labs: Recent Labs    06/27/19 0422 06/28/19 0350 06/28/19 0947 06/28/19 1146 06/28/19 2044 06/29/19 0544  HGB 10.0* 9.2*  --   --   --  9.2*  HCT 30.2* 30.5*  --   --   --  29.0*  PLT 212 220  --   --   --  199  HEPARINUNFRC  --   --   --   --  0.23* 0.40  CREATININE 2.01* 1.86*  --   --   --  1.83*  TROPONINIHS  --   --  865* 1,308*  --   --     Estimated Creatinine Clearance: 29.3 mL/min (A) (by C-G formula based on SCr of 1.83 mg/dL (H)).   Medical History: Past Medical History:  Diagnosis Date  . Anxiety   . Cancer ([place])   . Depression   . History of kidney cancer   . Hyperlipidemia   . Hypertension     Assessment: 10 yoF with PMH HTN, HLD, RCC s/p R nephrectomy, IBS, admitted for worsening renal function. On 9/19, noted to have elevated troponin at 0.87. Pharmacy consulted to start heparin for ACS r/o. Note patient underwent cystoscopy with biopsy of ureteral mass and stent insertion.   Baseline INR, aPTT: not done, patient already on SQ heparin  Prior anticoagulation: none, ppx with SQ heparin while admitted  Significant events:  Today, 06/29/2019:  CBC: Hgb low but stable, Plt stable WNL  No bleeding or infusion issues per nursing  Heparin level is therapeutic at 0.4 after rate increased to 1000 units/hr, no infusion issues per RN. Pink tinged urine is noted by RN. Per urology L ureteral stent placed 9/18 and hematuria improving on  9/19 MD note.   Goal of Therapy: Heparin level 0.3-0.7 units/ml Monitor platelets by anticoagulation protocol: Yes  Plan:  Continue heparin drip at 1000 unit/shr  confirmatory HL in 8 hours  Daily CBC, daily heparin level once stable  Monitor for signs of bleeding or thrombosis - watch closely for new/worsening hematuria  Eudelia Bunch, Pharm.D [phone] 06/29/2019 10:48 AM

## 2019-06-29 NOTE — Progress Notes (Signed)
PROGRESS NOTE    [name]  [id number] DOB: 03-03-1946 DOA: 06/24/2019 PCP: [name], MD    Brief Narrative:  [name] is a 73 year old female with PMHx significant for hypertension, hyperlipidemia, renal cell carcinoma status post right nephrectomy (follows with [name]), IBS who was referred to the ED by her PCP given worsening renal function with creatinine noted at 2.3 (baseline 1.1). Patient reports some diarrhea.   Renal ultrasound did show left kidney hydronephrosis, with subsequent renal stone, showing possible mass/hemorrhage in the ureter. Urology consulted and EDP requested hospital service for admission.   Assessment & Plan:   Principal Problem:   AKI (acute kidney injury) ([place]) Active Problems:   Acute respiratory failure with hypoxia (HCC)   Acute systolic CHF (congestive heart failure) (HCC)   Elevated troponin   Hypertension   Hyperlipidemia   Depression   Hypokalemia   Hypomagnesemia   Bilateral leg edema   Diarrhea   Hypoxia  1 acute respiratory failure with hypoxia Patient early on in the hospitalization noted to have developed hypoxia following aggressive IV fluid resuscitation during initial presentation.  Chest x-ray which was done was consistent with pulmonary edema patient also noted to have lower extremity edema.  Patient received IV Lasix on 06/26/2019 with good urine output and clinical improvement and was titrated back to room air. Overnight on 06/27/2019 and early the morning of 06/28/2019 patient noted to have significant hypoxia with sats dropping in the 50s per RN with increased O2 requirements up to 6 L nasal cannula to keep sats greater than 92%.  Concern for volume overload.  Came and assessed patient and patient noted to have diffuse crackles on examination.  Patient with some trace to 1+ pedal edema.  Chest x-ray ordered consistent with volume overload and possible infiltrate.  Patient afebrile.  Patient also noted with  a leukocytosis.  Cycle cardiac enzymes and first set of cardiac enzymes elevated at [phone] [id number]..  EKG done with slight ST depression in leads V4 through V6.  Patient denied any ongoing chest pain.  2D echo done with EF of 40 to 45% with diffuse hypokinesis.  Cardiology consulted due to elevated troponin levels in the setting of hypoxia and probable acute CHF exacerbation.  Case was discussed with urology Dr. Jeffie Pollock and patient placed on IV heparin without a bolus as patient with recent instrumentation with biopsy.  Monitor closely for bleeding.  Patient with good urine output of 4.875 L over the past 24 hours after being started on Lasix 40 mg IV daily.  Due to concerns for possible pulmonary infiltrate urinary Legionella antigen ordered which is pending, urine is antigen negative.  Continue empiric IV Rocephin and azithromycin.  Strict I's and O's.  Daily weights.  Patient for stress test tomorrow per cardiology recommendations.  Appreciate cardiology input and recs.   2.  Acute combined systolic and diastolic CHF exacerbation Patient noted to have significant hypoxia overnight and this morning requiring 6 L of nasal cannula.  Patient was not O2 dependent prior to admission.  First set of cardiac enzymes elevated.  We will continue serial cardiac enzymes.  2D echo with a a EF of 40 to 45% with diffuse hypokinesis.  Patient with a urine output of 4.875 L over the past 24 hours.  Continue Lasix 40 mg IV daily.  Strict I's and O's.  Daily weights.  Patient seen in consultation by cardiology who have recommended stress test to be done tomorrow 06/30/2019.  Strict I's and O's.  Daily weights.  Per cardiology.  3.  Elevated troponin/rule out ACS/abnormal 2D echo Questionable etiology.  Patient with history of hypertension, hyperlipidemia, prior tobacco use.  Patient noted to have sudden onset hypoxia overnight.  Patient denied any ongoing chest pain.  EKG done in sinus rhythm with slight ST depression in leads V4  through V6.  2D echo with a EF of 40 to 45% with diffuse hypokinesis.  Continue IV heparin which was started on 06/28/2019 with no bolus as patient with recent cystoscopy and biopsies done.  Patient with no bleeding.  Patient seen in consultation by cardiology and patient has been set up for a stress test to be done tomorrow 06/30/2019.  Per cardiology.   4.  Hypokalemia/hypomagnesemia Magnesium at 1.7.  We will give magnesium sulfate 4 g IV x1.  Potassium at 2.8 secondary to diuresis we will give K. Dur 40 mEq p.o. every 4 hours x3 doses.   5.  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  Renal ultrasound done concerning for hydronephrosis of solitary left kidney.  CT stone protocol was done that showed mild left hydroureteronephrosis to the level of the pelvis with appearance of soft tissue density within the ureter consistent with hemorrhage versus soft tissue mass such as a urothelial malignancy.  Urology was consulted and patient transferred to [facility] for further evaluation with cystoscopy.  Patient subsequently underwent cystoscopy with left retrograde pyelogram and interpretation, left ureteroscopy with biopsy of left ureteral neoplasm insertion of left double-J stent.  Patient with a urine output of 4.875 L over the past 24 hours.  Creatinine down and seems to be stabilizing at 1.83 today.  Monitor closely with diuresis.  Per nephrology.  6.  Hyperlipidemia Continue statin.  7.  History of IBS/chronic diarrhea Patient with history of chronic diarrhea not on treatment in the outpatient setting.  Outpatient follow-up with gastroenterology.  8.  Depression Stable.  Continue home regimen of sertraline and trazodone.   9.  Hypertension Patient noted not to be on any antihypertensive medications  prior to admission.  Continue current regimen of Norvasc, hydralazine.  Patient on IV Lasix.  Blood pressure better controlled.  Follow.   DVT prophylaxis: Heparin Code Status: Full Family Communication: Updated patient.  No family at bedside. Disposition Plan: Likely home when clinically improved and when okay with cardiology.   Consultants:   Urology: Dr. Matilde Sprang 06/25/2019  Cardiology: Dr. Lovena Le 06/29/2019  Procedures:   2D echo pending 06/28/2019  CT renal stone protocol 06/25/2019  Renal ultrasound 06/25/2019  Chest x-ray 06/25/2019, 06/26/2019, 06/27/2019, 06/28/2019  Cystoscopy with left retrograde pyelogram and interpretation, left uteroscopy with biopsy of left ureteral neoplasm/insertion of left double-J stent per Dr. Jeffie Pollock 06/27/2019  Antimicrobials:   IV Rocephin 06/28/2019  IV azithromycin 06/28/2019   Subjective: Patient states feeling better than she did yesterday.  O2 requirements improving.  Denies any chest pain.  No abdominal pain.  Good urine output per patient.  Denies any bleeding or significant hematuria.   Objective: Vitals:   06/29/19 0736 06/29/19 0844 06/29/19 1311 06/29/19 1334  BP:  138/67 (!) 134/59   Pulse:   88   Resp:      Temp:    (!) 97.5 F (36.4 C)  TempSrc:    Oral  SpO2: 96%  96%   Weight:  Height:        Intake/Output Summary (Last 24 hours) at 06/29/2019 1714 Last data filed at 06/29/2019 1500 Gross per 24 hour  Intake 907.3 ml  Output 3600 ml  Net -2692.7 ml   Filed Weights   06/28/19 0553 06/28/19 0937 06/29/19 0500  Weight: 88 kg 88.3 kg 86.1 kg    Examination:  General exam: Appears calm and comfortable  Respiratory system: Bibasilar crackles.  No wheezing.  No rhonchi.  Fair air movement.  Speaking in full sentences.  Normal respiratory effort.  Cardiovascular system: Regular rate and rhythm no murmurs rubs or gallops.  No JVD.  No lower extremity edema.  Gastrointestinal system: Abdomen is nontender,  nondistended, soft, positive bowel sounds.  No rebound.  No guarding.    Central nervous system: Alert and oriented. No focal neurological deficits. Extremities: Symmetric 5 x 5 power. Skin: No rashes, lesions or ulcers Psychiatry: Judgement and insight appear normal. Mood & affect appropriate.     Data Reviewed: I have personally reviewed following labs and imaging studies  CBC: Recent Labs  Lab 06/24/19 1848 06/25/19 0622 06/26/19 0535 06/27/19 0422 06/28/19 0350 06/29/19 0544  WBC 9.2 9.8 11.9* 10.7* 12.2* 10.9*  NEUTROABS 5.8  --  8.6*  --   --   --   HGB 11.2* 11.0* 10.4* 10.0* 9.2* 9.2*  HCT 32.5* 31.8* 31.7* 30.2* 30.5* 29.0*  MCV 91.5 91.1 93.5 94.4 100.7* 97.6  PLT 223 214 227 212 220 [id number]   Basic Metabolic Panel: Recent Labs  Lab 06/25/19 0622  06/26/19 0535 06/26/19 1759 06/27/19 0422 06/27/19 0734 06/27/19 1250 06/28/19 0350 06/29/19 0544  NA 144   < > 144 142 143  --   --  141 144  K <2.0*   < > 2.8* 3.6 2.8*  --  3.0* 3.3* 2.8*  CL 97*   < > 103 104 102  --   --  100 99  CO2 33*   < > 28 28 29   --   --  26 30  GLUCOSE 116*   < > 112* 153* 100*  --   --  132* 89  BUN 18   < > 14 13 11   --   --  15 22  CREATININE 2.41*   < > 2.20* 2.03* 2.01*  --   --  1.86* 1.83*  CALCIUM 7.5*   < > 7.4* 7.8* 8.2*  --   --  8.5* 8.7*  MG 2.7*  --  2.0  --   --  1.6*  --  2.2 1.7   < > = values in this interval not displayed.   GFR: Estimated Creatinine Clearance: 29.3 mL/min (A) (by C-G formula based on SCr of 1.83 mg/dL (H)). Liver Function Tests: Recent Labs  Lab 06/24/19 1848 06/29/19 0544  AST 27 47*  ALT 17 42  ALKPHOS 50 49  BILITOT 0.7 0.8  PROT 6.7 6.7  ALBUMIN 3.7 3.7   No results for input(s): LIPASE, AMYLASE in the last 168 hours. No results for input(s): AMMONIA in the last 168 hours. Coagulation Profile: No results for input(s): INR, PROTIME in the last 168 hours. Cardiac Enzymes: No results for input(s): CKTOTAL, CKMB, CKMBINDEX, TROPONINI  in the last 168 hours. BNP (last 3 results) No results for input(s): PROBNP in the last 8760 hours. HbA1C: No results for input(s): HGBA1C in the last 72 hours. CBG: Recent Labs  Lab 06/28/19 0522  GLUCAP 126*   Lipid Profile: No results  for input(s): CHOL, HDL, LDLCALC, TRIG, CHOLHDL, LDLDIRECT in the last 72 hours. Thyroid Function Tests: No results for input(s): TSH, T4TOTAL, FREET4, T3FREE, THYROIDAB in the last 72 hours. Anemia Panel: No results for input(s): VITAMINB12, FOLATE, FERRITIN, TIBC, IRON, RETICCTPCT in the last 72 hours. Sepsis Labs: No results for input(s): PROCALCITON, LATICACIDVEN in the last 168 hours.  Recent Results (from the past 240 hour(s))  SARS CORONAVIRUS 2 (TAT 6-24 HRS) Nasopharyngeal Nasopharyngeal Swab     Status: None   Collection Time: 06/25/19  4:12 AM   Specimen: Nasopharyngeal Swab  Result Value Ref Range Status   SARS Coronavirus 2 NEGATIVE NEGATIVE Final    Comment: (NOTE) SARS-CoV-2 target nucleic acids are NOT DETECTED. The SARS-CoV-2 RNA is generally detectable in upper and lower respiratory specimens during the acute phase of infection. Negative results do not preclude SARS-CoV-2 infection, do not rule out co-infections with other pathogens, and should not be used as the sole basis for treatment or other patient management decisions. Negative results must be combined with clinical observations, patient history, and epidemiological information. The expected result is Negative. Fact Sheet for Patients: SugarRoll.be Fact Sheet for Healthcare Providers: https://www.woods-mathews.com/ This test is not yet approved or cleared by the [place] FDA and  has been authorized for detection and/or diagnosis of SARS-CoV-2 by FDA under an Emergency Use Authorization (EUA). This EUA will remain  in effect (meaning this test can be used) for the duration of the COVID-19 declaration under Section  56 4(b)(1) of the Act, 21 U.S.C. section 360bbb-3(b)(1), unless the authorization is terminated or revoked sooner. Performed at [facility], [place] [address] [place], [place] [address]   Culture, blood (routine x 2) Call MD if unable to obtain prior to antibiotics being given     Status: None (Preliminary result)   Collection Time: 06/28/19  9:54 AM   Specimen: BLOOD  Result Value Ref Range Status   Specimen Description   Final    BLOOD BLOOD RIGHT HAND Performed at [facility] [address] [place], [place] [address]    Special Requests   Final    BOTTLES DRAWN AEROBIC ONLY Blood Culture adequate volume Performed at [facility] [address], [place], [place] [address]    Culture   Final    NO GROWTH 1 DAY Performed at [facility], [place] [address] [place], [place] [address]    Report Status PENDING  Incomplete  Culture, blood (routine x 2) Call MD if unable to obtain prior to antibiotics being given     Status: None (Preliminary result)   Collection Time: 06/28/19  9:54 AM   Specimen: BLOOD  Result Value Ref Range Status   Specimen Description   Final    BLOOD RIGHT ANTECUBITAL Performed at [facility] [address] [place], [place] [address]    Special Requests   Final    BOTTLES DRAWN AEROBIC ONLY Blood Culture adequate volume Performed at [facility] [address], [place], [place] [address]    Culture   Final    NO GROWTH 1 DAY Performed at [facility], [place] [address], [place], [place] [address]    Report Status PENDING  Incomplete         Radiology Studies: Dg Chest Port 1 View  Result Date: 06/28/2019 CLINICAL DATA:  Shortness of breath this morning post renal stent placement 1 day prior. EXAM: PORTABLE CHEST 1 VIEW COMPARISON:  Chest x-rays dated 06/26/2019 06/25/2019. FINDINGS: Increasing opacity at the medial aspects of the RIGHT lung base. Mildly  prominent interstitial markings bilaterally. Stable cardiomegaly. Stable elevation of the RIGHT hemidiaphragm no pneumothorax seen. IMPRESSION: 1. Increasing opacity at the medial aspects of the RIGHT lung base, suspicious for pneumonia, alternatively asymmetric pulmonary edema. 2. Stable cardiomegaly. 3. Mildly prominent interstitial markings suggesting mild interstitial edema versus chronic interstitial lung disease. Electronically Signed   By: Franki Cabot M.D.   On: 06/28/2019 09:00   Dg C-arm 1-60 Min-no Report  Result Date: 06/27/2019 Fluoroscopy was utilized by the requesting physician.  No radiographic interpretation.        Scheduled Meds:  amLODipine  10 mg Oral Daily   calcium carbonate  1 tablet Oral Daily   cholecalciferol  1,000 Units Oral Daily   furosemide  40 mg Intravenous Daily   hydrALAZINE  25 mg Oral Q8H   potassium chloride  40 mEq Oral Q4H   sertraline  50 mg Oral Daily   simvastatin  20 mg Oral Daily   traZODone  100 mg Oral QHS   Continuous Infusions:  sodium chloride 250 mL (06/27/19 1013)   azithromycin 250 mL/hr at 06/29/19 0953   cefTRIAXone (ROCEPHIN)  IV 2 g (06/29/19 1034)   heparin 1,100 Units/hr (06/29/19 1627)     LOS: 4 days    Time spent: 40 minutes    Irine Seal, MD [facility]  If 7PM-7AM, please contact night-coverage [email or web address] 06/29/2019, 5:14 PM

## 2019-06-29 NOTE — Consult Note (Signed)
Cardiology Consultation:   Patient ID: [name] MRN: [id number]; DOB: May 26, 1946  Admit date: 06/24/2019 Date of Consult: 06/29/2019  Primary Care Provider: [name], MD Primary Cardiologist: No primary care provider on file.  Primary Electrophysiologist:  None    Patient Profile:   [name] is a 73 y.o. female with a hx of volume overload who is being seen today for the evaluation of volume overload at the request of Dr. Grandville Silos.  History of Present Illness:   [name] has a h/o hypertension, hyperlipidemia, renal cell carcinoma status post right nephrectomy (follows with [name]), Timoteo Expose was referred to the ED by her PCP given worsening renal function with creatinine noted at2.3.  (baseline 1.1). The patient has had worsening sob and was hypoxic yesterday and treated with IV lasix. She today denies chest pain or sob. Her peripheral edema has resolved. Her renal function is stable. Of note her troponin is elevated though she has no symptoms. Her ECG's have been reviewed and are unchanged and non-specific.  Heart Pathway Score:     Past Medical History:  Diagnosis Date   Anxiety    Cancer ([place])    Depression    History of kidney cancer    Hyperlipidemia    Hypertension     Past Surgical History:  Procedure Laterality Date   bladder-partial removal     kidney removed       Home Medications:  Prior to Admission medications   Medication Sig Start Date End Date Taking? Authorizing Provider  acetaminophen (TYLENOL) 500 MG tablet Take 1,000 mg by mouth every 6 (six) hours as needed for headache.   Yes [provider]  calcium carbonate 200 MG capsule Take 250 mg by mouth daily.    Yes [provider]  cholecalciferol (VITAMIN D) 1000 UNITS tablet Take 1,000 Units by mouth daily.   Yes [provider]  sertraline (ZOLOFT) 50 MG tablet Take 50 mg by mouth daily.   Yes [provider]  simvastatin  (ZOCOR) 20 MG tablet Take 20 mg by mouth daily.   Yes [provider]  traZODone (DESYREL) 50 MG tablet Take 100 mg by mouth at bedtime.   Yes [provider]  cephALEXin (KEFLEX) 500 MG capsule Take 1 capsule (500 mg total) by mouth 3 (three) times daily. Patient not taking: Reported on 06/25/2019 12/05/13   [name], DPM  HYDROcodone-acetaminophen (NORCO/VICODIN) 5-325 MG tablet Take 1 tablet by mouth every 6 (six) hours as needed for severe pain. Patient not taking: Reported on 06/25/2019 01/04/17   Mesner, Corene Cornea, MD  [facility]) 5 % SOLN Apply 1 drop each affected nail once daily for 12 months duration Patient not taking: Reported on 06/25/2019 04/15/14   [name], DPM    Inpatient Medications: Scheduled Meds:  amLODipine  10 mg Oral Daily   calcium carbonate  1 tablet Oral Daily   cholecalciferol  1,000 Units Oral Daily   furosemide  40 mg Intravenous Daily   hydrALAZINE  25 mg Oral Q8H   potassium chloride  40 mEq Oral Q4H   sertraline  50 mg Oral Daily   simvastatin  20 mg Oral Daily   traZODone  100 mg Oral QHS   Continuous Infusions:  sodium chloride 250 mL (06/27/19 1013)   azithromycin 500 mg (06/29/19 0849)   cefTRIAXone (ROCEPHIN)  IV 2 g (06/28/19 1043)   heparin 1,000 Units/hr (06/28/19 2141)   magnesium sulfate bolus IVPB     vasopressin (  PITRESSIN) infusion - *FOR SHOCK*     PRN Meds: sodium chloride, acetaminophen **OR** acetaminophen, hydrALAZINE, ondansetron **OR** ondansetron (ZOFRAN) IV  Allergies:    Allergies  Allergen Reactions   Erythromycin Diarrhea and Nausea And Vomiting   Other     Unknown blood pressure pill caused severe coughing    Social History:   Social History   Socioeconomic History   Marital status: Married    Spouse name: Not on file   Number of children: Not on file   Years of education: Not on file   Highest education level: Not on file  Occupational History   Not on  file  Social Needs   Financial resource strain: Not on file   Food insecurity    Worry: Not on file    Inability: Not on file   Transportation needs    Medical: Not on file    Non-medical: Not on file  Tobacco Use   Smoking status: Former Smoker    Packs/day: 0.50    Years: 38.00    Pack years: 19.00    Types: Cigarettes    Quit date: 11/26/2003    Years since quitting: 15.6   Smokeless tobacco: Never Used  Substance and Sexual Activity   Alcohol use: No    Alcohol/week: 0.0 standard drinks   Drug use: No   Sexual activity: Not on file  Lifestyle   Physical activity    Days per week: Not on file    Minutes per session: Not on file   Stress: Not on file  Relationships   Social connections    Talks on phone: Not on file    Gets together: Not on file    Attends religious service: Not on file    Active member of club or organization: Not on file    Attends meetings of clubs or organizations: Not on file    Relationship status: Not on file   Intimate partner violence    Fear of current or ex partner: Not on file    Emotionally abused: Not on file    Physically abused: Not on file    Forced sexual activity: Not on file  Other Topics Concern   Not on file  Social History Narrative   Not on file    Family History:    Family History  Problem Relation Age of Onset   Hypertension Mother    Diabetes Mellitus II Sister    Hypertension Sister    Hypertension Brother      ROS:  Please see the history of present illness.   All other ROS reviewed and negative.     Physical Exam/Data:   Vitals:   06/29/19 0500 06/29/19 0535 06/29/19 0736 06/29/19 0844  BP:  (!) 141/64  138/67  Pulse:  88    Resp:  20    Temp:  98.8 F (37.1 C)    TempSrc:  Oral    SpO2:  92% 96%   Weight: 86.1 kg     Height:        Intake/Output Summary (Last 24 hours) at 06/29/2019 0852 Last data filed at 06/29/2019 0736 Gross per 24 hour  Intake 1211.07 ml  Output 5025 ml   Net -3813.93 ml   Last 3 Weights 06/29/2019 06/28/2019 06/28/2019  Weight (lbs) 189 lb 13.1 oz 194 lb 11.2 oz 194 lb  Weight (kg) 86.1 kg 88.315 kg 87.998 kg     Body mass index is 33.62 kg/m.  General:  Well  nourished, well developed, in no acute distress HEENT: normal Lymph: no adenopathy Neck: no JVD Endocrine:  No thryomegaly Vascular: No carotid bruits; FA pulses 2+ bilaterally without bruits  Cardiac:  normal S1, S2; RRR; no murmur  Lungs:  clear to auscultation bilaterally, no wheezing, rhonchi or rales  Abd: soft, nontender, no hepatomegaly  Ext: no edema Musculoskeletal:  No deformities, BUE and BLE strength normal and equal Skin: warm and dry  Neuro:  CNs 2-12 intact, no focal abnormalities noted Psych:  Normal affect   EKG:  The EKG was personally reviewed and demonstrates:  nsr with non-specific STT changes Telemetry:  Telemetry was personally reviewed and demonstrates:  nsr  Relevant CV Studies: Mild LV dysfunction with diffuse hypokinesis  Laboratory Data:  High Sensitivity Troponin:   Recent Labs  Lab 06/28/19 0947 06/28/19 1146  TROPONINIHS 865* 1,308*     Chemistry Recent Labs  Lab 06/27/19 0422 06/27/19 1250 06/28/19 0350 06/29/19 0544  NA 143  --  141 144  K 2.8* 3.0* 3.3* 2.8*  CL 102  --  100 99  CO2 29  --  26 30  GLUCOSE 100*  --  132* 89  BUN 11  --  15 22  CREATININE 2.01*  --  1.86* 1.83*  CALCIUM 8.2*  --  8.5* 8.7*  GFRNONAA 24*  --  27* 27*  GFRAA 28*  --  31* 32*  ANIONGAP 12  --  15 15    Recent Labs  Lab 06/24/19 1848 06/29/19 0544  PROT 6.7 6.7  ALBUMIN 3.7 3.7  AST 27 47*  ALT 17 42  ALKPHOS 50 49  BILITOT 0.7 0.8   Hematology Recent Labs  Lab 06/27/19 0422 06/28/19 0350 06/29/19 0544  WBC 10.7* 12.2* 10.9*  RBC 3.20* 3.03* 2.97*  HGB 10.0* 9.2* 9.2*  HCT 30.2* 30.5* 29.0*  MCV 94.4 100.7* 97.6  MCH 31.3 30.4 31.0  MCHC 33.1 30.2 31.7  RDW 15.4 15.9* 15.4  PLT 212 220 199   BNP Recent Labs  Lab  06/25/19 0622  BNP 159.7*    DDimer No results for input(s): DDIMER in the last 168 hours.   Radiology/Studies:  Dg Chest Port 1 View  Result Date: 06/28/2019 CLINICAL DATA:  Shortness of breath this morning post renal stent placement 1 day prior. EXAM: PORTABLE CHEST 1 VIEW COMPARISON:  Chest x-rays dated 06/26/2019 06/25/2019. FINDINGS: Increasing opacity at the medial aspects of the RIGHT lung base. Mildly prominent interstitial markings bilaterally. Stable cardiomegaly. Stable elevation of the RIGHT hemidiaphragm no pneumothorax seen. IMPRESSION: 1. Increasing opacity at the medial aspects of the RIGHT lung base, suspicious for pneumonia, alternatively asymmetric pulmonary edema. 2. Stable cardiomegaly. 3. Mildly prominent interstitial markings suggesting mild interstitial edema versus chronic interstitial lung disease. Electronically Signed   By: Franki Cabot M.D.   On: 06/28/2019 09:00   Dg Chest Port 1 View  Result Date: 06/26/2019 CLINICAL DATA:  73 year old female with hypoxia EXAM: PORTABLE CHEST 1 VIEW COMPARISON:  Chest radiograph dated 06/25/2019 FINDINGS: Prominence of the interstitial markings primarily involving the mid to lower lung field compared to the prior radiograph may represent interval development of mild edema although pneumonia is not excluded. Clinical correlation is recommended. No focal consolidation, pleural effusion, pneumothorax. Stable cardiac silhouette. Atherosclerotic calcification of the aorta. No acute osseous pathology. IMPRESSION: Findings may represent interval development of mild edema although pneumonia is not excluded. Electronically Signed   By: Anner Crete M.D.   On: 06/26/2019 12:33  Dg Chest Port 1 View  Result Date: 06/25/2019 CLINICAL DATA:  Dyspnea EXAM: PORTABLE CHEST 1 VIEW COMPARISON:  08/24/2009, 06/25/2019 FINDINGS: The heart size is mildly enlarged, stable compared to prior CT. Calcific aortic knob. No focal airspace consolidation. No  pleural effusion or pneumothorax. IMPRESSION: No acute cardiopulmonary findings. Electronically Signed   By: Davina Poke M.D.   On: 06/25/2019 13:44   Dg C-arm 1-60 Min-no Report  Result Date: 06/27/2019 Fluoroscopy was utilized by the requesting physician.  No radiographic interpretation.   Ct Renal Stone Study  Result Date: 06/25/2019 CLINICAL DATA:  Lower abdominal pain on both sides for 3 weeks EXAM: CT ABDOMEN AND PELVIS WITHOUT CONTRAST TECHNIQUE: Multidetector CT imaging of the abdomen and pelvis was performed following the standard protocol without IV contrast. COMPARISON:  07/10/2012 FINDINGS: Lower chest: No acute abnormality. Hepatobiliary: No focal liver abnormality is seen. No gallstones, gallbladder wall thickening, or biliary dilatation. Pancreas: Unremarkable. No pancreatic ductal dilatation or surrounding inflammatory changes. Spleen: Normal in size without focal abnormality. Adrenals/Urinary Tract: Normal adrenal glands. Prior right nephrectomy. No left renal mass. Mild left hydroureteronephrosis to the level of the pelvis with there appears to be soft tissue density material within the ureter which may reflect hemorrhage or soft tissue mass such as urothelial malignancy. No urolithiasis. Normal bladder. Stomach/Bowel: Stomach is within normal limits. No evidence of bowel wall thickening, distention, or inflammatory changes. Diverticulosis without evidence of diverticulitis. Vascular/Lymphatic: Normal caliber abdominal aorta with mild atherosclerosis. No lymphadenopathy. Reproductive: Status post hysterectomy. No adnexal masses. Other: No abdominal wall hernia or abnormality. No abdominopelvic ascites. Musculoskeletal: No acute osseous abnormality. No aggressive degenerative disease with disc height loss at L2-3, L4-5 and L5-S1. Bilateral facet arthropathy of the lumbar spine. IMPRESSION: 1. Mild left hydroureteronephrosis to the level of the pelvis with there appears to be soft tissue  density material within the ureter which may reflect hemorrhage or soft tissue mass such as urothelial malignancy. Recommend correlation with urinalysis and urology consultation. 2. Diverticulosis without evidence of diverticulitis. Electronically Signed   By: Kathreen Devoid   On: 06/25/2019 09:31    Assessment and Plan:   1. Acute sob, now resolved. With troponin elevation, quick resolution of symptoms, and LV dysfunction on echo, I think we need to evaluate for ischemia. Renal dysfunction makes invasive evaluation less than optimal, especially in the setting of renal cell CA. We will order a stress test. She does not have angina.  2. Acute systolic and diastolic CHF - She appears euvolemic today. Continue oral lasix 3. Hypokalemia - replete 4. Chronic renal failure, stage 3. Her creatinine has been stable. Cardiac cath could be performed but I think only if her stress test is high/mod risk.      For questions or updates, please contact [place] Please consult [email or web address] for contact info under     Signed, Cristopher Peru, MD  06/29/2019 8:52 AM

## 2019-06-29 NOTE — Progress Notes (Signed)
2 Days Post-Op Subjective: S/p diagnostic left ureteroscopy and left ureteral stent placement on 06/27/2019.  Patient reports she feels well. SOB is better; undergoing cardiac workup for SOB and elevated trops.  Pain is well controlled. UOP excellent, getting diuresed.   Objective: Vital signs in last 24 hours: Temp:  [97.7 F (36.5 C)-98.8 F (37.1 C)] 98.8 F (37.1 C) (09/20 0535) Pulse Rate:  [86-88] 88 (09/20 0535) Resp:  [18-20] 20 (09/20 0535) BP: (131-146)/(64-76) 138/67 (09/20 0844) SpO2:  [92 %-97 %] 96 % (09/20 0736) Weight:  [86.1 kg] 86.1 kg (09/20 0500)  Intake/Output from previous day: 09/19 0701 - 09/20 0700 In: 1211.1 [P.O.:820; I.V.:41.1; IV Piggyback:350] Out: [id number] [Urine:4875] Intake/Output this shift: Total I/O In: 499.1 [P.O.:120; I.V.:122; IV Piggyback:257.1] Out: 450 [Urine:450]  Physical Exam:  Constitutional: No acute distress Eyes: PERRL, No scleral icterus.   Cardiovascular: RRR Pulmonary/Chest: Normal respiratory effort GI: Soft, nontender, nondistended.   Lab Results: Recent Labs    06/27/19 0422 06/28/19 0350 06/29/19 0544  HGB 10.0* 9.2* 9.2*  HCT 30.2* 30.5* 29.0*   BMET Recent Labs    06/28/19 0350 06/29/19 0544  NA 141 144  K 3.3* 2.8*  CL 100 99  CO2 26 30  GLUCOSE 132* 89  BUN 15 22  CREATININE 1.86* 1.83*  CALCIUM 8.5* 8.7*   No results for input(s): LABPT, INR in the last 72 hours. No results for input(s): LABURIN in the last 72 hours. Results for orders placed or performed during the hospital encounter of 06/24/19  SARS CORONAVIRUS 2 (TAT 6-24 HRS) Nasopharyngeal Nasopharyngeal Swab     Status: None   Collection Time: 06/25/19  4:12 AM   Specimen: Nasopharyngeal Swab  Result Value Ref Range Status   SARS Coronavirus 2 NEGATIVE NEGATIVE Final    Comment: (NOTE) SARS-CoV-2 target nucleic acids are NOT DETECTED. The SARS-CoV-2 RNA is generally detectable in upper and lower respiratory specimens during the acute  phase of infection. Negative results do not preclude SARS-CoV-2 infection, do not rule out co-infections with other pathogens, and should not be used as the sole basis for treatment or other patient management decisions. Negative results must be combined with clinical observations, patient history, and epidemiological information. The expected result is Negative. Fact Sheet for Patients: SugarRoll.be Fact Sheet for Healthcare Providers: https://www.woods-mathews.com/ This test is not yet approved or cleared by the [place] FDA and  has been authorized for detection and/or diagnosis of SARS-CoV-2 by FDA under an Emergency Use Authorization (EUA). This EUA will remain  in effect (meaning this test can be used) for the duration of the COVID-19 declaration under Section 56 4(b)(1) of the Act, 21 U.S.C. section 360bbb-3(b)(1), unless the authorization is terminated or revoked sooner. Performed at [facility], [place] [address], [place], [place] [address]     Studies/Results: Dg Chest Port 1 View  Result Date: 06/28/2019 CLINICAL DATA:  Shortness of breath this morning post renal stent placement 1 day prior. EXAM: PORTABLE CHEST 1 VIEW COMPARISON:  Chest x-rays dated 06/26/2019 06/25/2019. FINDINGS: Increasing opacity at the medial aspects of the RIGHT lung base. Mildly prominent interstitial markings bilaterally. Stable cardiomegaly. Stable elevation of the RIGHT hemidiaphragm no pneumothorax seen. IMPRESSION: 1. Increasing opacity at the medial aspects of the RIGHT lung base, suspicious for pneumonia, alternatively asymmetric pulmonary edema. 2. Stable cardiomegaly. 3. Mildly prominent interstitial markings suggesting mild interstitial edema versus chronic interstitial lung disease. Electronically Signed   By: Franki Cabot M.D.   On: 06/28/2019 09:00  Dg C-arm 1-60 Min-no Report  Result Date: 06/27/2019 Fluoroscopy was utilized by the  requesting physician.  No radiographic interpretation.    Assessment/Plan: 73 year old female now S/p diagnostic left ureteroscopy and left ureteral stent placement on 06/27/2019.  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  Biopsy was performed.  Patient aware of intraoperative findings.   She will likely need to undergo procedure for laser ablation of ureteral tumor soon.  This will be the best next step for management of her disease given solitary kidney. This will be arranged by urology team.  From a urologic standpoint, she is stable for discharge.  However she had elevated troponins on 9/19 and is currently undergoing cardiopulmonary work-up.  We will defer discharge timing to primary team and their evaluation of comorbidities.  Please advise urology team if we can be of assistance.    LOS: 4 days   Haskel Schroeder 06/29/2019, 11:30 AM

## 2019-06-30 ENCOUNTER — Encounter (HOSPITAL_COMMUNITY): Payer: Self-pay | Admitting: Urology

## 2019-06-30 ENCOUNTER — Encounter (HOSPITAL_COMMUNITY): Admission: RE | Payer: Self-pay | Source: Home / Self Care

## 2019-06-30 ENCOUNTER — Ambulatory Visit (HOSPITAL_COMMUNITY)
Admit: 2019-06-30 | Discharge: 2019-06-30 | Disposition: A | Discharge status: Home / Self Care | Payer: Medicare PPO | Attending: Internal Medicine | Admitting: Internal Medicine

## 2019-06-30 ENCOUNTER — Ambulatory Visit (HOSPITAL_COMMUNITY): Admission: RE | Admit: 2019-06-30 | Payer: Medicare PPO | Source: Home / Self Care | Admitting: Urology

## 2019-06-30 ENCOUNTER — Other Ambulatory Visit: Payer: Self-pay | Admitting: Urology

## 2019-06-30 DIAGNOSIS — D4959 Neoplasm of unspecified behavior of other genitourinary organ: Secondary | ICD-10-CM

## 2019-06-30 DIAGNOSIS — R0600 Dyspnea, unspecified: Secondary | ICD-10-CM

## 2019-06-30 LAB — CBC
HCT: 32.3 % — ABNORMAL LOW (ref 36.0–46.0)
Hemoglobin: 10.1 g/dL — ABNORMAL LOW (ref 12.0–15.0)
MCH: 30.7 pg (ref 26.0–34.0)
MCHC: 31.3 g/dL (ref 30.0–36.0)
MCV: 98.2 fL (ref 80.0–100.0)
Platelets: 261 10*3/uL (ref 150–400)
RBC: 3.29 MIL/uL — ABNORMAL LOW (ref 3.87–5.11)
RDW: 15 % (ref 11.5–15.5)
WBC: 9.1 10*3/uL (ref 4.0–10.5)
nRBC: 0.2 % (ref 0.0–0.2)

## 2019-06-30 LAB — NM MYOCAR MULTI W/SPECT W/WALL MOTION / EF
Estimated workload: 1 METS
Exercise duration (min): 0 min
Exercise duration (sec): 0 s
MPHR: 149 {beats}/min
Peak HR: 96 {beats}/min
Percent HR: 64 %
RPE: 0
Rest HR: 85 {beats}/min
TID: 1.21

## 2019-06-30 LAB — LIPID PANEL
Cholesterol: 161 mg/dL (ref 0–200)
HDL: 49 mg/dL (ref >40)
LDL Cholesterol: 88 mg/dL (ref 0–99)
Total CHOL/HDL Ratio: 3.3 RATIO
Triglycerides: 119 mg/dL (ref <150)
VLDL: 24 mg/dL (ref 0–40)

## 2019-06-30 LAB — BASIC METABOLIC PANEL
Anion gap: 13 (ref 5–15)
BUN: 22 mg/dL (ref 8–23)
CO2: 31 mmol/L (ref 22–32)
Calcium: 8.9 mg/dL (ref 8.9–10.3)
Chloride: 100 mmol/L (ref 98–111)
Creatinine, Ser: 1.72 mg/dL — ABNORMAL HIGH (ref 0.44–1.00)
GFR calc Af Amer: 34 mL/min — ABNORMAL LOW (ref >60)
GFR calc non Af Amer: 29 mL/min — ABNORMAL LOW (ref >60)
Glucose, Bld: 96 mg/dL (ref 70–99)
Potassium: 3.4 mmol/L — ABNORMAL LOW (ref 3.5–5.1)
Sodium: 144 mmol/L (ref 135–145)

## 2019-06-30 LAB — HEPARIN LEVEL (UNFRACTIONATED): Heparin Unfractionated: 0.27 IU/mL — ABNORMAL LOW (ref 0.30–0.70)

## 2019-06-30 LAB — LEGIONELLA PNEUMOPHILA SEROGP 1 UR AG: L. pneumophila Serogp 1 Ur Ag: NEGATIVE

## 2019-06-30 LAB — MAGNESIUM: Magnesium: 2.2 mg/dL (ref 1.7–2.4)

## 2019-06-30 SURGERY — CYSTOSCOPY/RETROGRADE/URETEROSCOPY
Anesthesia: Choice | Laterality: Left

## 2019-06-30 MED ORDER — REGADENOSON 0.4 MG/5ML IV SOLN
0.4000 mg | Freq: Once | INTRAVENOUS | Status: AC
  Administered 2019-06-30: 0.4 mg via INTRAVENOUS
  Filled 2019-06-30: qty 5

## 2019-06-30 MED ORDER — CEFDINIR 300 MG PO CAPS
300.0000 mg | ORAL_CAPSULE | Freq: Two times a day (BID) | ORAL | Status: DC
  Administered 2019-06-30 – 2019-07-02 (×5): 300 mg via ORAL
  Filled 2019-06-30 (×6): qty 1

## 2019-06-30 MED ORDER — TECHNETIUM TC 99M TETROFOSMIN IV KIT
30.0000 | PACK | Freq: Once | INTRAVENOUS | Status: AC | PRN
  Administered 2019-06-30: 30 via INTRAVENOUS

## 2019-06-30 MED ORDER — HEPARIN (PORCINE) 25000 UT/250ML-% IV SOLN
1200.0000 [IU]/h | INTRAVENOUS | Status: DC
  Administered 2019-06-30 (×2): 1200 [IU]/h via INTRAVENOUS
  Filled 2019-06-30: qty 250

## 2019-06-30 MED ORDER — TECHNETIUM TC 99M TETROFOSMIN IV KIT
10.0000 | PACK | Freq: Once | INTRAVENOUS | Status: AC | PRN
  Administered 2019-06-30: 09:00:00 10 via INTRAVENOUS

## 2019-06-30 MED ORDER — REGADENOSON 0.4 MG/5ML IV SOLN
INTRAVENOUS | Status: AC
  Administered 2019-06-30: 10:00:00 0.4 mg
  Filled 2019-06-30: qty 5

## 2019-06-30 MED ORDER — POTASSIUM CHLORIDE CRYS ER 20 MEQ PO TBCR
40.0000 meq | EXTENDED_RELEASE_TABLET | Freq: Once | ORAL | Status: AC
  Administered 2019-06-30: 40 meq via ORAL
  Filled 2019-06-30: qty 2

## 2019-06-30 NOTE — Progress Notes (Signed)
Progress Note  Patient Name: [name] Date of Encounter: 06/30/2019  Primary Cardiologist:Taylor   Subjective   73 year old admitted with shortness of breath and was found to be volume overloaded.  Troponin levels were elevated.  She was scheduled for a stress Myoview study today.  Inpatient Medications    Scheduled Meds: . amLODipine  10 mg Oral Daily  . calcium carbonate  1 tablet Oral Daily  . cefdinir  300 mg Oral Q12H  . cholecalciferol  1,000 Units Oral Daily  . furosemide  40 mg Intravenous Daily  . hydrALAZINE  25 mg Oral Q8H  . potassium chloride  40 mEq Oral Once  . sertraline  50 mg Oral Daily  . simvastatin  20 mg Oral Daily  . traZODone  100 mg Oral QHS   Continuous Infusions: . sodium chloride 250 mL (06/27/19 1013)  . heparin 1,100 Units/hr (06/29/19 1800)   PRN Meds: sodium chloride, acetaminophen **OR** acetaminophen, hydrALAZINE, ondansetron **OR** ondansetron (ZOFRAN) IV   Vital Signs    Vitals:   06/30/19 0957 06/30/19 1016 06/30/19 1019 06/30/19 1020  BP: 134/64 135/62 138/61 136/61  Pulse:      Resp:      Temp:      TempSrc:      SpO2:      Weight:      Height:        Intake/Output Summary (Last 24 hours) at 06/30/2019 1135 Last data filed at 06/30/2019 0400 Gross per 24 hour  Intake 939.26 ml  Output 2901 ml  Net -1961.74 ml   Last 3 Weights 06/30/2019 06/29/2019 06/28/2019  Weight (lbs) 186 lb 1.1 oz 189 lb 13.1 oz 194 lb 11.2 oz  Weight (kg) 84.4 kg 86.1 kg 88.315 kg      Telemetry    NSR  - Personally Reviewed  ECG      - Personally Reviewed  Physical Exam   GEN: No acute distress.   Neck: No JVD Cardiac: RRR,  Soft systolic murmur  Respiratory: Clear to auscultation bilaterally. GI: Soft, nontender, non-distended  MS: No edema; No deformity. Neuro:  Nonfocal  Psych: Normal affect   Labs    High Sensitivity Troponin:   Recent Labs  Lab 06/28/19 0947 06/28/19 1146  TROPONINIHS 865* 1,308*       Chemistry Recent Labs  Lab 06/24/19 1848  06/28/19 0350 06/29/19 0544 06/30/19 0738  NA 143   < > 141 144 144  K <2.0*   < > 3.3* 2.8* 3.4*  CL 94*   < > 100 99 100  CO2 32   < > 26 30 31   GLUCOSE 157*   < > 132* 89 96  BUN 17   < > 15 22 22   CREATININE 2.54*   < > 1.86* 1.83* 1.72*  CALCIUM 7.5*   < > 8.5* 8.7* 8.9  PROT 6.7  --   --  6.7  --   ALBUMIN 3.7  --   --  3.7  --   AST 27  --   --  47*  --   ALT 17  --   --  42  --   ALKPHOS 50  --   --  49  --   BILITOT 0.7  --   --  0.8  --   GFRNONAA 18*   < > 27* 27* 29*  GFRAA 21*   < > 31* 32* 34*  ANIONGAP 17*   < > 15 15 13    < > =  values in this interval not displayed.     Hematology Recent Labs  Lab 06/28/19 0350 06/29/19 0544 06/30/19 0738  WBC 12.2* 10.9* 9.1  RBC 3.03* 2.97* 3.29*  HGB 9.2* 9.2* 10.1*  HCT 30.5* 29.0* 32.3*  MCV 100.7* 97.6 98.2  MCH 30.4 31.0 30.7  MCHC 30.2 31.7 31.3  RDW 15.9* 15.4 15.0  PLT 220 199 261    BNP Recent Labs  Lab 06/25/19 0622  BNP 159.7*     DDimer No results for input(s): DDIMER in the last 168 hours.   Radiology    No results found.  Cardiac Studies     Patient Profile     73 y.o. female admitted with increasing dyspnea.   Assessment & Plan    1.  Dyspnea:    Currently having a stress Myoview study.  Results are still pending.  Chronic kidney disease: Status post nephrectomy for renal cell carcinoma.  Creatinine is 1.72.  This is actually improved since yesterday.  2.  Acute combined systolic and diastolic congestive heart failure She is breathing quite a bit better. Her net diuresis has been 1.4 L since admission.  3.  Hyperlipidemia: Continue simvastatin.       For questions or updates, please contact [place] Please consult [email or web address] for contact info under        Signed, Mertie Moores, MD  06/30/2019, 11:35 AM

## 2019-06-30 NOTE — Plan of Care (Signed)

## 2019-06-30 NOTE — Progress Notes (Signed)
Stress portion of stress test done.  Results to follow later today.

## 2019-06-30 NOTE — Progress Notes (Signed)
[name] for IV heparin Indication: elevated troponin, r/o ACS  Allergies  Allergen Reactions  . Erythromycin Diarrhea and Nausea And Vomiting  . Other     Unknown blood pressure pill caused severe coughing    Patient Measurements: Height: 5\' 3"  (160 cm) Weight: 189 lb 13.1 oz (86.1 kg) IBW/kg (Calculated) : 52.4 Heparin Dosing Weight: 72 kg  Vital Signs: Temp: 98.4 F (36.9 C) (09/20 2011) Temp Source: Oral (09/20 2011) BP: 146/68 (09/20 2011) Pulse Rate: 86 (09/20 2011)  Labs: Recent Labs    06/27/19 0422 06/28/19 0350 06/28/19 0947 06/28/19 1146  06/29/19 0544 06/29/19 1411 06/29/19 2300  HGB 10.0* 9.2*  --   --   --  9.2*  --   --   HCT 30.2* 30.5*  --   --   --  29.0*  --   --   PLT 212 220  --   --   --  199  --   --   HEPARINUNFRC  --   --   --   --    < > 0.40 0.29* 0.30  CREATININE 2.01* 1.86*  --   --   --  1.83*  --   --   TROPONINIHS  --   --  865* 1,308*  --   --   --   --    < > = values in this interval not displayed.    Estimated Creatinine Clearance: 29.3 mL/min (A) (by C-G formula based on SCr of 1.83 mg/dL (H)).   Medical History: Past Medical History:  Diagnosis Date  . Anxiety   . Cancer ([place])   . Depression   . History of kidney cancer   . Hyperlipidemia   . Hypertension     Assessment: 57 yoF with PMH HTN, HLD, RCC s/p R nephrectomy, IBS, admitted for worsening renal function. On 9/19, noted to have elevated troponin at 0.87. Pharmacy consulted to start heparin for ACS r/o. Note patient underwent cystoscopy with biopsy of ureteral mass and stent insertion.   Baseline INR, aPTT: not done, patient already on SQ heparin  Prior anticoagulation: none, ppx with SQ heparin while admitted  Significant events:  Today, 06/30/2019:  CBC: Hgb low but stable, Plt stable WNL  No bleeding or infusion issues per nursing  Confirmatory Heparin level is slightly low at 0.29 on 1000 units/hr, no infusion  issues per RN. Per urology L ureteral stent placed 9/18 and hematuria improving on 9/19 MD note. Per RN 9/20 hematuria continues to improve  2300 HL = 0.30 at goal , no infusion or bleeding issues reported by RN. Scr = 1.83  Goal of Therapy: Heparin level 0.3-0.7 units/ml Monitor platelets by anticoagulation protocol: Yes  Plan:  Continue heparin drip at 1100 units/hr  Daily CBC, daily heparin level once stable  Monitor for signs of bleeding or thrombosis - watch closely for new/worsening hematuria   Dorrene [place] 06/30/2019 1:01 AM

## 2019-06-30 NOTE — Progress Notes (Signed)
[place] for IV heparin Indication: elevated troponin, r/o ACS  Allergies  Allergen Reactions  . Erythromycin Diarrhea and Nausea And Vomiting  . Other     Unknown blood pressure pill caused severe coughing    Patient Measurements: Height: 5\' 3"  (160 cm) Weight: 186 lb 1.1 oz (84.4 kg) IBW/kg (Calculated) : 52.4 Heparin Dosing Weight: 71.2 kg  Vital Signs: Temp: 97.8 F (36.6 C) (09/21 1220) Temp Source: Oral (09/21 1220) BP: 146/66 (09/21 1220) Pulse Rate: 87 (09/21 1220)  Labs: Recent Labs    06/28/19 0350 06/28/19 0947 06/28/19 1146  06/29/19 0544 06/29/19 1411 06/29/19 2300 06/30/19 0738  HGB 9.2*  --   --   --  9.2*  --   --  10.1*  HCT 30.5*  --   --   --  29.0*  --   --  32.3*  PLT 220  --   --   --  199  --   --  261  HEPARINUNFRC  --   --   --    < > 0.40 0.29* 0.30 0.27*  CREATININE 1.86*  --   --   --  1.83*  --   --  1.72*  TROPONINIHS  --  865* 1,308*  --   --   --   --   --    < > = values in this interval not displayed.    Estimated Creatinine Clearance: 30.9 mL/min (A) (by C-G formula based on SCr of 1.72 mg/dL (H)).   Medical History: Past Medical History:  Diagnosis Date  . Anxiety   . Cancer ([place])   . Depression   . History of kidney cancer   . Hyperlipidemia   . Hypertension     Assessment: 67 yoF with PMH HTN, HLD, RCC s/p R nephrectomy, IBS, admitted for worsening renal function. On 9/19, noted to have elevated troponin. Pharmacy consulted to start heparin for ACS r/o. Note patient underwent cystoscopy with biopsy of ureteral mass and stent insertion.   Baseline INR, aPTT: not done  Prior anticoagulation: none, ppx with SQ heparin while admitted  Today, 06/30/2019:  CBC: Hgb low/improved. Pltc WNL  Heparin level = 0.27 units/mL, slightly subtherapeutic   No bleeding or infusion issues per nursing  Awaiting results of stress test performed today   Goal of Therapy: Heparin level  0.3-0.7 units/ml Monitor platelets by anticoagulation protocol: Yes  Plan:  Increase heparin infusion to 1200 units/hr  Heparin level 8 hours after rate change  Daily CBC, heparin level   Monitor closely for s/sx of bleeding-watch closely for new/worsening hematuria  F/u results of stress test   Lindell Spar, PharmD, BCPS Clinical Pharmacist  06/30/2019 1:21 PM

## 2019-06-30 NOTE — Progress Notes (Signed)
nuc study is low risk - will stop IV heparin.  Will ask IM to address DVT prophylaxis

## 2019-06-30 NOTE — Care Management Important Message (Signed)
Important Message  Patient Details IM Letter given to Dessa Phi RN to present to the Patient Name: [name] MRN: [id number] Date of Birth: 11/16/1945   Medicare Important Message Given:  Yes     Kerin Salen 06/30/2019, 12:02 PM

## 2019-06-30 NOTE — Progress Notes (Signed)
[facility] arranged for transportation to [facility] @ [facility] for stress test @ 0800.

## 2019-06-30 NOTE — Progress Notes (Signed)
PROGRESS NOTE    [name]  [id number] DOB: Dec 13, 1945 DOA: 06/24/2019 PCP: [name], MD    Brief Narrative:  [name] is a 73 year old female with PMHx significant for hypertension, hyperlipidemia, renal cell carcinoma status post right nephrectomy (follows with [name]), IBS who was referred to the ED by her PCP given worsening renal function with creatinine noted at 2.3 (baseline 1.1). Patient reports some diarrhea.   Renal ultrasound did show left kidney hydronephrosis, with subsequent renal stone, showing possible mass/hemorrhage in the ureter. Urology consulted and EDP requested hospital service for admission.   Assessment & Plan:   Principal Problem:   AKI (acute kidney injury) ([place]) Active Problems:   Acute respiratory failure with hypoxia (HCC)   Acute systolic CHF (congestive heart failure) (HCC)   Elevated troponin   Hypertension   Hyperlipidemia   Depression   Hypokalemia   Hypomagnesemia   Bilateral leg edema   Diarrhea   Hypoxia  1 acute respiratory failure with hypoxia Patient early on in the hospitalization noted to have developed hypoxia following aggressive IV fluid resuscitation during initial presentation.  Chest x-ray which was done was consistent with pulmonary edema patient also noted to have lower extremity edema.  Patient received IV Lasix on 06/26/2019 with good urine output and clinical improvement and was titrated back to room air. Overnight on 06/27/2019 and early the morning of 06/28/2019 patient noted to have significant hypoxia with sats dropping in the 50s per RN with increased O2 requirements up to 6 L nasal cannula to keep sats greater than 92%.  Concern for volume overload.  Came and assessed patient and patient noted to have diffuse crackles on examination.  Patient with some trace to 1+ pedal edema.  Chest x-ray ordered consistent with volume overload and possible infiltrate.  Patient afebrile.  Patient also noted with  a leukocytosis.  Cycle cardiac enzymes and first set of cardiac enzymes elevated at [phone] [id number]..  EKG done with slight ST depression in leads V4 through V6.  Patient denied any ongoing chest pain.  2D echo done with EF of 40 to 45% with diffuse hypokinesis.  Cardiology consulted due to elevated troponin levels in the setting of hypoxia and probable acute CHF exacerbation.  Case was discussed with urology Dr. Jeffie Pollock and patient placed on IV heparin without a bolus as patient with recent instrumentation with biopsy.  Monitor closely for bleeding.  Patient with good urine output of 3.350 L over the past 24 hours after being started on Lasix 40 mg IV daily.  Due to concerns for possible pulmonary infiltrate urinary Legionella antigen ordered which is pending, urine is antigen negative.  DC IV Rocephin and azithromycin and placed on oral Vantin for 2-3 more days to treat empirically for total of 5 days. Strict I's and O's.  Daily weights.  Patient for stress test today per cardiology recommendations.  Appreciate cardiology input and recs.   2.  Acute combined systolic and diastolic CHF exacerbation Patient noted to have significant hypoxia overnight and this morning requiring 6 L of nasal cannula.  Patient was not O2 dependent prior to admission.  First set of cardiac enzymes elevated.  We will continue serial cardiac enzymes.  2D echo with a a EF of 40 to 45% with diffuse hypokinesis.  Patient with a urine output of 3.350 L over the past 24 hours.  Morning labs pending.  Patient still with some bibasilar crackles noted on examination.  Continue Lasix 40 mg IV daily.  Strict I's and O's.  Daily weights.  Patient seen in consultation by cardiology who have recommended stress test to be done today 06/30/2019.  Per cardiology.  3.  Elevated troponin/rule out ACS/abnormal 2D echo Questionable etiology.  Patient with history of hypertension, hyperlipidemia, prior tobacco use.  Patient noted to have sudden onset hypoxia  overnight.  Patient denied any ongoing chest pain.  EKG done in sinus rhythm with slight ST depression in leads V4 through V6.  2D echo with a EF of 40 to 45% with diffuse hypokinesis.  Continue IV heparin which was started on 06/28/2019 with no bolus as patient with recent cystoscopy and biopsies done.  Patient with no bleeding.  Patient seen in consultation by cardiology and patient has been set up for a stress test to be done today 06/30/2019.  Per cardiology.   4.  Hypokalemia/hypomagnesemia Magnesium was at 1.7.  Potassium was 2.8 on 06/29/2019.  Morning labs pending.  Follow with diuresis.  Keep potassium greater than 4.  Keep magnesium greater than 2.   5.  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  Renal ultrasound done concerning for hydronephrosis of solitary left kidney.  CT stone protocol was done that showed mild left hydroureteronephrosis to the level of the pelvis with appearance of soft tissue density within the ureter consistent with hemorrhage versus soft tissue mass such as a urothelial malignancy.  Urology was consulted and patient transferred to [facility] for further evaluation with cystoscopy.  Patient subsequently underwent cystoscopy with left retrograde pyelogram and interpretation, left ureteroscopy with biopsy of left ureteral neoplasm insertion of left double-J stent.  Patient with a urine output of 3.350 L over the past 24 hours.  Creatinine was trending down and was 1.83 on 06/29/2019.  Morning labs pending.  Monitor closely with diuresis.  Per urology.  6.  Hyperlipidemia Continue statin.  7.  History of IBS/chronic diarrhea Patient with history of chronic diarrhea not on treatment in the outpatient setting.  Outpatient follow-up with gastroenterology.  8.  Depression Continue  sertraline and trazodone.    9.  Hypertension Patient noted not to be on any antihypertensive medications prior to admission.  Blood pressure improved on current regimen of Norvasc and hydralazine.  Patient on IV Lasix.  Follow.   DVT prophylaxis: Heparin Code Status: Full Family Communication: Updated patient.  No family at bedside. Disposition Plan: Likely home when clinically improved and when okay with cardiology.   Consultants:   Urology: Dr. Matilde Sprang 06/25/2019  Cardiology: Dr. Lovena Le 06/29/2019  Procedures:   2D echo  06/28/2019  CT renal stone protocol 06/25/2019  Renal ultrasound 06/25/2019  Chest x-ray 06/25/2019, 06/26/2019, 06/27/2019, 06/28/2019  Cystoscopy with left retrograde pyelogram and interpretation, left uteroscopy with biopsy of left ureteral neoplasm/insertion of left double-J stent per Dr. Jeffie Pollock 06/27/2019  Stress test pending 06/30/2019  Antimicrobials:   IV Rocephin 06/28/2019 >>>>>> 06/30/2019  IV azithromycin 06/28/2019>>>>>> 06/30/2019  Oral Vantin 06/30/2019   Subjective: Patient sitting up in bed putting lotion on her feet.  Patient states shortness of breath is improved.  Denies any chest pain.  Denies any abdominal pain.  Good urine output.  Denies any bleeding.  Patient states had some soft stools this morning.  Objective: Vitals:   06/29/19 1334 06/29/19 2011 06/30/19 0410 06/30/19 0549  BP:  (!) 146/68  135/64  Pulse:  86  84  Resp:  18  18  Temp: (!) 97.5 F (36.4 C) 98.4 F (36.9 C)  98.2 F (36.8 C)  TempSrc: Oral Oral  Oral  SpO2:  96%  95%  Weight:   84.4 kg   Height:        Intake/Output Summary (Last 24 hours) at 06/30/2019 0755 Last data filed at 06/30/2019 0400 Gross per 24 hour  Intake 1438.39 ml  Output 3201 ml  Net -1762.61 ml   Filed Weights   06/28/19 0937 06/29/19 0500 06/30/19 0410  Weight: 88.3 kg 86.1 kg 84.4 kg    Examination:  General exam: NAD Respiratory system: Bibasilar crackles.  No wheezes, no  rhonchi.  Fair air movement.  Speaking in full sentences.  Normal respiratory effort.  Cardiovascular system: RRR no murmurs rubs or gallops.  No JVD.  No lower extremity edema.   Gastrointestinal system: Abdomen is soft, nontender, nondistended, positive bowel sounds.  No rebound.  No guarding.    Central nervous system: Alert and oriented. No focal neurological deficits. Extremities: Symmetric 5 x 5 power. Skin: No rashes, lesions or ulcers Psychiatry: Judgement and insight appear normal. Mood & affect appropriate.     Data Reviewed: I have personally reviewed following labs and imaging studies  CBC: Recent Labs  Lab 06/24/19 1848 06/25/19 0622 06/26/19 0535 06/27/19 0422 06/28/19 0350 06/29/19 0544  WBC 9.2 9.8 11.9* 10.7* 12.2* 10.9*  NEUTROABS 5.8  --  8.6*  --   --   --   HGB 11.2* 11.0* 10.4* 10.0* 9.2* 9.2*  HCT 32.5* 31.8* 31.7* 30.2* 30.5* 29.0*  MCV 91.5 91.1 93.5 94.4 100.7* 97.6  PLT 223 214 227 212 220 [id number]   Basic Metabolic Panel: Recent Labs  Lab 06/25/19 0622  06/26/19 0535 06/26/19 1759 06/27/19 0422 06/27/19 0734 06/27/19 1250 06/28/19 0350 06/29/19 0544  NA 144   < > 144 142 143  --   --  141 144  K <2.0*   < > 2.8* 3.6 2.8*  --  3.0* 3.3* 2.8*  CL 97*   < > 103 104 102  --   --  100 99  CO2 33*   < > 28 28 29   --   --  26 30  GLUCOSE 116*   < > 112* 153* 100*  --   --  132* 89  BUN 18   < > 14 13 11   --   --  15 22  CREATININE 2.41*   < > 2.20* 2.03* 2.01*  --   --  1.86* 1.83*  CALCIUM 7.5*   < > 7.4* 7.8* 8.2*  --   --  8.5* 8.7*  MG 2.7*  --  2.0  --   --  1.6*  --  2.2 1.7   < > = values in this interval not displayed.   GFR: Estimated Creatinine Clearance: 29 mL/min (A) (by C-G formula based on SCr of 1.83 mg/dL (H)). Liver Function Tests: Recent Labs  Lab 06/24/19 1848 06/29/19 0544  AST 27 47*  ALT 17 42  ALKPHOS 50 49  BILITOT 0.7 0.8  PROT 6.7 6.7  ALBUMIN 3.7 3.7   No results for input(s): LIPASE, AMYLASE in the last 168  hours. No results for input(s): AMMONIA in the last 168 hours. Coagulation Profile: No results for input(s): INR, PROTIME in the last 168 hours. Cardiac Enzymes: No results for input(s): CKTOTAL, CKMB, CKMBINDEX, TROPONINI in the last 168  hours. BNP (last 3 results) No results for input(s): PROBNP in the last 8760 hours. HbA1C: No results for input(s): HGBA1C in the last 72 hours. CBG: Recent Labs  Lab 06/28/19 0522  GLUCAP 126*   Lipid Profile: No results for input(s): CHOL, HDL, LDLCALC, TRIG, CHOLHDL, LDLDIRECT in the last 72 hours. Thyroid Function Tests: No results for input(s): TSH, T4TOTAL, FREET4, T3FREE, THYROIDAB in the last 72 hours. Anemia Panel: No results for input(s): VITAMINB12, FOLATE, FERRITIN, TIBC, IRON, RETICCTPCT in the last 72 hours. Sepsis Labs: No results for input(s): PROCALCITON, LATICACIDVEN in the last 168 hours.  Recent Results (from the past 240 hour(s))  SARS CORONAVIRUS 2 (TAT 6-24 HRS) Nasopharyngeal Nasopharyngeal Swab     Status: None   Collection Time: 06/25/19  4:12 AM   Specimen: Nasopharyngeal Swab  Result Value Ref Range Status   SARS Coronavirus 2 NEGATIVE NEGATIVE Final    Comment: (NOTE) SARS-CoV-2 target nucleic acids are NOT DETECTED. The SARS-CoV-2 RNA is generally detectable in upper and lower respiratory specimens during the acute phase of infection. Negative results do not preclude SARS-CoV-2 infection, do not rule out co-infections with other pathogens, and should not be used as the sole basis for treatment or other patient management decisions. Negative results must be combined with clinical observations, patient history, and epidemiological information. The expected result is Negative. Fact Sheet for Patients: SugarRoll.be Fact Sheet for Healthcare Providers: https://www.woods-mathews.com/ This test is not yet approved or cleared by the [place] FDA and  has been authorized  for detection and/or diagnosis of SARS-CoV-2 by FDA under an Emergency Use Authorization (EUA). This EUA will remain  in effect (meaning this test can be used) for the duration of the COVID-19 declaration under Section 56 4(b)(1) of the Act, 21 U.S.C. section 360bbb-3(b)(1), unless the authorization is terminated or revoked sooner. Performed at [facility], [place] [address], [place], [place] [address]   Culture, blood (routine x 2) Call MD if unable to obtain prior to antibiotics being given     Status: None (Preliminary result)   Collection Time: 06/28/19  9:54 AM   Specimen: BLOOD  Result Value Ref Range Status   Specimen Description   Final    BLOOD BLOOD RIGHT HAND Performed at [facility] [address] [place], [place] [address]    Special Requests   Final    BOTTLES DRAWN AEROBIC ONLY Blood Culture adequate volume Performed at [facility] [address], [place], [place] [address]    Culture   Final    NO GROWTH 1 DAY Performed at [facility], [place] [address] [place], [place] [address]    Report Status PENDING  Incomplete  Culture, blood (routine x 2) Call MD if unable to obtain prior to antibiotics being given     Status: None (Preliminary result)   Collection Time: 06/28/19  9:54 AM   Specimen: BLOOD  Result Value Ref Range Status   Specimen Description   Final    BLOOD RIGHT ANTECUBITAL Performed at [facility] [address] [place], [place] [address]    Special Requests   Final    BOTTLES DRAWN AEROBIC ONLY Blood Culture adequate volume Performed at [facility] [address] [place], [place] [address]    Culture   Final    NO GROWTH 1 DAY Performed at [facility], [place] [address], [place],  [address]    Report Status PENDING  Incomplete         Radiology Studies: Dg Chest Port 1 View  Result Date: 06/28/2019 CLINICAL DATA:  Shortness of  breath this morning post renal stent placement 1 day prior. EXAM: PORTABLE CHEST 1 VIEW COMPARISON:  Chest x-rays dated 06/26/2019 06/25/2019. FINDINGS: Increasing opacity at the medial aspects of the RIGHT lung base. Mildly prominent interstitial markings bilaterally. Stable cardiomegaly. Stable elevation of the RIGHT hemidiaphragm no pneumothorax seen. IMPRESSION: 1. Increasing opacity at the medial aspects of the RIGHT lung base, suspicious for pneumonia, alternatively asymmetric pulmonary edema. 2. Stable cardiomegaly. 3. Mildly prominent interstitial markings suggesting mild interstitial edema versus chronic interstitial lung disease. Electronically Signed   By: Franki Cabot M.D.   On: 06/28/2019 09:00        Scheduled Meds:  amLODipine  10 mg Oral Daily   calcium carbonate  1 tablet Oral Daily   cholecalciferol  1,000 Units Oral Daily   furosemide  40 mg Intravenous Daily   hydrALAZINE  25 mg Oral Q8H   sertraline  50 mg Oral Daily   simvastatin  20 mg Oral Daily   traZODone  100 mg Oral QHS   Continuous Infusions:  sodium chloride 250 mL (06/27/19 1013)   azithromycin 250 mL/hr at 06/29/19 0953   cefTRIAXone (ROCEPHIN)  IV 2 g (06/29/19 1034)   heparin 1,100 Units/hr (06/29/19 1800)     LOS: 5 days    Time spent: 35 minutes    Irine Seal, MD [facility]  If 7PM-7AM, please contact night-coverage [email or web address] 06/30/2019, 7:55 AM

## 2019-07-01 DIAGNOSIS — I5021 Acute systolic (congestive) heart failure: Secondary | ICD-10-CM

## 2019-07-01 DIAGNOSIS — N179 Acute kidney failure, unspecified: Secondary | ICD-10-CM

## 2019-07-01 DIAGNOSIS — R7989 Other specified abnormal findings of blood chemistry: Secondary | ICD-10-CM

## 2019-07-01 DIAGNOSIS — I10 Essential (primary) hypertension: Secondary | ICD-10-CM

## 2019-07-01 LAB — BASIC METABOLIC PANEL
Anion gap: 13 (ref 5–15)
BUN: 19 mg/dL (ref 8–23)
CO2: 31 mmol/L (ref 22–32)
Calcium: 8.8 mg/dL — ABNORMAL LOW (ref 8.9–10.3)
Chloride: 97 mmol/L — ABNORMAL LOW (ref 98–111)
Creatinine, Ser: 1.56 mg/dL — ABNORMAL HIGH (ref 0.44–1.00)
GFR calc Af Amer: 38 mL/min — ABNORMAL LOW (ref >60)
GFR calc non Af Amer: 33 mL/min — ABNORMAL LOW (ref >60)
Glucose, Bld: 103 mg/dL — ABNORMAL HIGH (ref 70–99)
Potassium: 3 mmol/L — ABNORMAL LOW (ref 3.5–5.1)
Sodium: 141 mmol/L (ref 135–145)

## 2019-07-01 LAB — LIPID PANEL
Cholesterol: 150 mg/dL (ref 0–200)
HDL: 47 mg/dL (ref >40)
LDL Cholesterol: 80 mg/dL (ref 0–99)
Total CHOL/HDL Ratio: 3.2 RATIO
Triglycerides: 117 mg/dL (ref <150)
VLDL: 23 mg/dL (ref 0–40)

## 2019-07-01 LAB — CYTOLOGY - NON PAP

## 2019-07-01 LAB — MAGNESIUM: Magnesium: 1.8 mg/dL (ref 1.7–2.4)

## 2019-07-01 LAB — SURGICAL PATHOLOGY

## 2019-07-01 MED ORDER — ENOXAPARIN SODIUM 30 MG/0.3ML ~~LOC~~ SOLN
30.0000 mg | SUBCUTANEOUS | Status: DC
  Administered 2019-07-01: 30 mg via SUBCUTANEOUS
  Filled 2019-07-01: qty 0.3

## 2019-07-01 MED ORDER — ISOSORBIDE MONONITRATE ER 30 MG PO TB24
30.0000 mg | ORAL_TABLET | Freq: Every day | ORAL | Status: DC
  Administered 2019-07-01 – 2019-07-02 (×2): 30 mg via ORAL
  Filled 2019-07-01 (×2): qty 1

## 2019-07-01 MED ORDER — CARVEDILOL 6.25 MG PO TABS
6.2500 mg | ORAL_TABLET | Freq: Two times a day (BID) | ORAL | Status: DC
  Administered 2019-07-01 – 2019-07-02 (×3): 6.25 mg via ORAL
  Filled 2019-07-01 (×3): qty 1

## 2019-07-01 MED ORDER — AMLODIPINE BESYLATE 5 MG PO TABS
5.0000 mg | ORAL_TABLET | Freq: Every day | ORAL | Status: DC
  Administered 2019-07-02: 5 mg via ORAL
  Filled 2019-07-01: qty 1

## 2019-07-01 MED ORDER — POTASSIUM CHLORIDE CRYS ER 20 MEQ PO TBCR
40.0000 meq | EXTENDED_RELEASE_TABLET | ORAL | Status: AC
  Administered 2019-07-01 (×2): 40 meq via ORAL
  Filled 2019-07-01 (×2): qty 2

## 2019-07-01 MED ORDER — HYDRALAZINE HCL 50 MG PO TABS
50.0000 mg | ORAL_TABLET | Freq: Three times a day (TID) | ORAL | Status: DC
  Administered 2019-07-01 – 2019-07-02 (×4): 50 mg via ORAL
  Filled 2019-07-01 (×4): qty 1

## 2019-07-01 MED ORDER — MAGNESIUM SULFATE 4 GM/100ML IV SOLN
4.0000 g | Freq: Once | INTRAVENOUS | Status: AC
  Administered 2019-07-01: 4 g via INTRAVENOUS
  Filled 2019-07-01: qty 100

## 2019-07-01 NOTE — Progress Notes (Signed)
PROGRESS NOTE    [name]  [id number] DOB: [birth year] DOA: 06/24/2019 PCP: [name], MD    Brief Narrative:  [name] is a 73 year old female with PMHx significant for hypertension, hyperlipidemia, renal cell carcinoma status post right nephrectomy (follows with [name]), IBS who was referred to the ED by her PCP given worsening renal function with creatinine noted at 2.3 (baseline 1.1). Patient reports some diarrhea.   Renal ultrasound did show left kidney hydronephrosis, with subsequent renal stone, showing possible mass/hemorrhage in the ureter. Urology consulted and EDP requested hospital service for admission.   Assessment & Plan:   Principal Problem:   AKI (acute kidney injury) ([place]) Active Problems:   Acute respiratory failure with hypoxia (HCC)   Acute systolic CHF (congestive heart failure) (HCC)   Elevated troponin   Hypertension   Hyperlipidemia   Depression   Hypokalemia   Hypomagnesemia   Bilateral leg edema   Diarrhea   Hypoxia  1 acute respiratory failure with hypoxia Patient early on in the hospitalization noted to have developed hypoxia following aggressive IV fluid resuscitation during initial presentation.  Chest x-ray which was done was consistent with pulmonary edema patient also noted to have lower extremity edema.  Patient received IV Lasix on 06/26/2019 with good urine output and clinical improvement and was titrated back to room air. Overnight on 06/27/2019 and early the morning of 06/28/2019 patient noted to have significant hypoxia with sats dropping in the 50s per RN with increased O2 requirements up to 6 L nasal cannula to keep sats greater than 92%.  Concern for volume overload.  Came and assessed patient and patient noted to have diffuse crackles on examination.  Patient with some trace to 1+ pedal edema.  Chest x-ray ordered consistent with volume overload and possible infiltrate.  Patient afebrile.  Patient also noted with  a leukocytosis.  Cycle cardiac enzymes and first set of cardiac enzymes elevated at [phone] [id number]..  EKG done with slight ST depression in leads V4 through V6.  Patient denied any ongoing chest pain.  2D echo done with EF of 40 to 45% with diffuse hypokinesis.  Cardiology consulted due to elevated troponin levels in the setting of hypoxia and probable acute CHF exacerbation.  Case was discussed with urology Dr. Jeffie Pollock and patient placed on IV heparin without a bolus as patient with recent instrumentation with biopsy.  Monitor closely for bleeding.  Patient with good urine output of 1.6 L over the past 24 hours after being started on Lasix 40 mg IV daily.  Due to concerns for possible pulmonary infiltrate urinary Legionella antigen ordered which is pending, urine is antigen negative.  IV Rocephin and azithromycin has been transitioned to oral Vantin which we will continue through tomorrow and subsequently discontinued.  No further antibiotics will be needed.  Continue IV Lasix and cardiac medications.  Strict I's and O's.  Daily weights.  Patient underwent stress Myoview test see problem #3.  Cardiology following and I appreciate the input and recommendations.   2.  Acute combined systolic and diastolic CHF exacerbation Patient noted to have significant hypoxia overnight and this morning requiring 6 L of nasal cannula.  Patient was not O2 dependent prior to admission.  First set of cardiac enzymes elevated.  We will continue serial cardiac enzymes.  2D echo with a a EF of 40 to 45% with diffuse hypokinesis.  Patient with a urine output of 1.6 L over the past 24 hours.  Patient still with some bibasilar  crackles noted on examination which is improved.  Continue Lasix 40 mg IV daily.  Norvasc dose has been decreased.  Hydralazine dose increased per cardiology and isosorbide added.  Strict I's and O's.  Daily weights.  See #3.  Per cardiology.  3.  Elevated troponin/rule out ACS/abnormal 2D  echo/cardiomyopathy Questionable etiology.  Patient with history of hypertension, hyperlipidemia, prior tobacco use.  Patient noted to have sudden onset hypoxia overnight.  Patient denied any ongoing chest pain.  EKG done in sinus rhythm with slight ST depression in leads V4 - V6.  2D echo with a EF of 40 to 45% with diffuse hypokinesis.  Patient started on IV heparin on 06/28/2019 with no bolus as patient with recent cystoscopy and biopsies done.  Patient with no bleeding.  Patient seen in consultation by cardiology and patient underwent Myoview stress test on 06/30/2019 which medium defect of mild severity present in the mid anterior septal, mid inferior lateral, apical anterior, apical septal and apical lateral location which improved with stress and per cardiology not consistent with ischemia.  Normal wall motion.  Cardiology states patient has a TID of 1.21 meaning balanced ischemia multiple territories cannot be excluded.  Patient noted to have a elevated troponin levels with slight ST lateral depression.  Patient noted to have a high salt diet and as such we will have nutrition for diet education.  Patient's medications have been adjusted per cardiology with a decreasing Norvasc, increasing hydralazine, addition of isosorbide while monitoring renal function.  Unable to place on ACE inhibitor due to renal function.  Patient on IV Lasix.  Per cardiology.    4.  Hypokalemia/hypomagnesemia Magnesiu at 1.8.  Potassium at 3.0 today.  Likely secondary to diuresis.  K. Dur 40 mEq p.o. every 4 hours x2 doses.  IV magnesium.  Keep potassium greater than 4.  Keep magnesium greater than 2.   5.  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  Renal ultrasound done concerning for hydronephrosis of solitary left kidney.  CT stone  protocol was done that showed mild left hydroureteronephrosis to the level of the pelvis with appearance of soft tissue density within the ureter consistent with hemorrhage versus soft tissue mass such as a urothelial malignancy.  Urology was consulted and patient transferred to [facility] for further evaluation with cystoscopy.  Patient subsequently underwent cystoscopy with left retrograde pyelogram and interpretation, left ureteroscopy with biopsy of left ureteral neoplasm insertion of left double-J stent.  Patient with a urine output of 1.6 L over the past 24 hours.  Creatinine  trending down and currently at 1.56.  Monitor with diuresis.  Per urology.   6.  Hyperlipidemia Statin.    7.  History of IBS/chronic diarrhea Patient with history of chronic diarrhea not on treatment in the outpatient setting.  Outpatient follow-up with gastroenterology.  8.  Depression Continue sertraline and trazodone.    9.  Hypertension Patient noted not to be on any antihypertensive medications prior to admission.  Blood pressure improved on current regimen of Norvasc and hydralazine and IV Lasix.  Due to depressed LV function on 2D echo cardiology has decreased patient's amlodipine with a goal of discontinuing it altogether.  Hydralazine dose has been increased per cardiology and isosorbide added to patient's regimen.  Follow.   DVT prophylaxis: Lovenox Code Status: Full Family  Communication: Updated patient.  No family at bedside. Disposition Plan: Likely home when clinically improved and when okay with cardiology hopefully in the next 24 to 48 hours.   Consultants:   Urology: Dr. Matilde Sprang 06/25/2019  Cardiology: Dr. Lovena Le 06/29/2019  Procedures:   2D echo  06/28/2019  CT renal stone protocol 06/25/2019  Renal ultrasound 06/25/2019  Chest x-ray 06/25/2019, 06/26/2019, 06/27/2019, 06/28/2019  Cystoscopy with left retrograde pyelogram and interpretation, left uteroscopy with biopsy of left  ureteral neoplasm/insertion of left double-J stent per Dr. Jeffie Pollock 06/27/2019  Stress test 06/30/2019  Antimicrobials:   IV Rocephin 06/28/2019 >>>>>> 06/30/2019  IV azithromycin 06/28/2019>>>>>> 06/30/2019  Oral Vantin 06/30/2019 >>>>>> 07/02/2019   Subjective: Patient sitting up in bed.  Denies any shortness of breath.  Denies any chest pain.  Feeling better.  Good urine output.  Denies any bleeding.  Having bowel movements.  Hoping she is going to be able to take a shower.    Objective: Vitals:   06/30/19 1220 06/30/19 2139 07/01/19 0500 07/01/19 0522  BP: (!) 146/66 (!) 158/73  135/63  Pulse: 87 89    Resp: 18 16    Temp: 97.8 F (36.6 C) 98.1 F (36.7 C)    TempSrc: Oral Oral    SpO2: 90% 97%    Weight:   82.3 kg   Height:        Intake/Output Summary (Last 24 hours) at 07/01/2019 1204 Last data filed at 07/01/2019 1100 Gross per 24 hour  Intake 745.61 ml  Output 2000 ml  Net -1254.39 ml   Filed Weights   06/29/19 0500 06/30/19 0410 07/01/19 0500  Weight: 86.1 kg 84.4 kg 82.3 kg    Examination:  General exam: NAD Respiratory system: Decreased bibasilar crackles.  No wheezes, no rhonchi.  Fair air movement.  Speaking in full sentences.  Normal respiratory effort.   Cardiovascular system: Regular rate rhythm no murmurs rubs or gallops.  No JVD.  No lower extremity edema.  Gastrointestinal system: Abdomen is nontender, nondistended, soft, positive bowel sounds.  No rebound.  No guarding.   Central nervous system: Alert and oriented. No focal neurological deficits. Extremities: Symmetric 5 x 5 power. Skin: No rashes, lesions or ulcers Psychiatry: Judgement and insight appear normal. Mood & affect appropriate.     Data Reviewed: I have personally reviewed following labs and imaging studies  CBC: Recent Labs  Lab 06/24/19 1848  06/26/19 0535 06/27/19 0422 06/28/19 0350 06/29/19 0544 06/30/19 0738  WBC 9.2   < > 11.9* 10.7* 12.2* 10.9* 9.1  NEUTROABS 5.8  --   8.6*  --   --   --   --   HGB 11.2*   < > 10.4* 10.0* 9.2* 9.2* 10.1*  HCT 32.5*   < > 31.7* 30.2* 30.5* 29.0* 32.3*  MCV 91.5   < > 93.5 94.4 100.7* 97.6 98.2  PLT 223   < > 227 212 220 199 261   < > = values in this interval not displayed.   Basic Metabolic Panel: Recent Labs  Lab 06/27/19 0422 06/27/19 0734 06/27/19 1250 06/28/19 0350 06/29/19 0544 06/30/19 0738 07/01/19 0443  NA 143  --   --  141 144 144 141  K 2.8*  --  3.0* 3.3* 2.8* 3.4* 3.0*  CL 102  --   --  100 99 100 97*  CO2 29  --   --  26 30 31 31   GLUCOSE 100*  --   --  132* 89 96 103*  BUN 11  --   --  15 22 22 19   CREATININE 2.01*  --   --  1.86* 1.83* 1.72* 1.56*  CALCIUM 8.2*  --   --  8.5* 8.7* 8.9 8.8*  MG  --  1.6*  --  2.2 1.7 2.2 1.8   GFR: Estimated Creatinine Clearance: 33.6 mL/min (A) (by C-G formula based on SCr of 1.56 mg/dL (H)). Liver Function Tests: Recent Labs  Lab 06/24/19 1848 06/29/19 0544  AST 27 47*  ALT 17 42  ALKPHOS 50 49  BILITOT 0.7 0.8  PROT 6.7 6.7  ALBUMIN 3.7 3.7   No results for input(s): LIPASE, AMYLASE in the last 168 hours. No results for input(s): AMMONIA in the last 168 hours. Coagulation Profile: No results for input(s): INR, PROTIME in the last 168 hours. Cardiac Enzymes: No results for input(s): CKTOTAL, CKMB, CKMBINDEX, TROPONINI in the last 168 hours. BNP (last 3 results) No results for input(s): PROBNP in the last 8760 hours. HbA1C: No results for input(s): HGBA1C in the last 72 hours. CBG: Recent Labs  Lab 06/28/19 0522  GLUCAP 126*   Lipid Profile: Recent Labs    06/30/19 0738 07/01/19 0443  CHOL 161 150  HDL 49 47  LDLCALC 88 80  TRIG 119 117  CHOLHDL 3.3 3.2   Thyroid Function Tests: No results for input(s): TSH, T4TOTAL, FREET4, T3FREE, THYROIDAB in the last 72 hours. Anemia Panel: No results for input(s): VITAMINB12, FOLATE, FERRITIN, TIBC, IRON, RETICCTPCT in the last 72 hours. Sepsis Labs: No results for input(s): PROCALCITON,  LATICACIDVEN in the last 168 hours.  Recent Results (from the past 240 hour(s))  SARS CORONAVIRUS 2 (TAT 6-24 HRS) Nasopharyngeal Nasopharyngeal Swab     Status: None   Collection Time: 06/25/19  4:12 AM   Specimen: Nasopharyngeal Swab  Result Value Ref Range Status   SARS Coronavirus 2 NEGATIVE NEGATIVE Final    Comment: (NOTE) SARS-CoV-2 target nucleic acids are NOT DETECTED. The SARS-CoV-2 RNA is generally detectable in upper and lower respiratory specimens during the acute phase of infection. Negative results do not preclude SARS-CoV-2 infection, do not rule out co-infections with other pathogens, and should not be used as the sole basis for treatment or other patient management decisions. Negative results must be combined with clinical observations, patient history, and epidemiological information. The expected result is Negative. Fact Sheet for Patients: SugarRoll.be Fact Sheet for Healthcare Providers: https://www.woods-mathews.com/ This test is not yet approved or cleared by the [place] FDA and  has been authorized for detection and/or diagnosis of SARS-CoV-2 by FDA under an Emergency Use Authorization (EUA). This EUA will remain  in effect (meaning this test can be used) for the duration of the COVID-19 declaration under Section 56 4(b)(1) of the Act, 21 U.S.C. section 360bbb-3(b)(1), unless the authorization is terminated or revoked sooner. Performed at [facility], [place] [address], [place], [place] [address]   Culture, blood (routine x 2) Call MD if unable to obtain prior to antibiotics being given     Status: None (Preliminary result)   Collection Time: 06/28/19  9:54 AM   Specimen: BLOOD RIGHT HAND  Result Value Ref Range Status   Specimen Description   Final    BLOOD RIGHT HAND Performed at [facility], [place] [address] [place], [place] [address]    Special Requests   Final    BOTTLES DRAWN AEROBIC ONLY  Blood Culture adequate volume Performed at [facility]  [address] [place], [place] [address]    Culture   Final    NO GROWTH 3 DAYS Performed at [facility], [place] [address], [place], [place] [address]    Report Status PENDING  Incomplete  Culture, blood (routine x 2) Call MD if unable to obtain prior to antibiotics being given     Status: None (Preliminary result)   Collection Time: 06/28/19  9:54 AM   Specimen: BLOOD  Result Value Ref Range Status   Specimen Description   Final    BLOOD RIGHT ANTECUBITAL Performed at [place] [address] [place], [place] [address]    Special Requests   Final    BOTTLES DRAWN AEROBIC ONLY Blood Culture adequate volume Performed at [place] [address] [place], [place] [address]    Culture   Final    NO GROWTH 3 DAYS Performed at [facility], [place] [address] [place], [place] [address]    Report Status PENDING  Incomplete         Radiology Studies: Nm Myocar Multi W/spect W/wall Motion / Ef  Result Date: 06/30/2019  There was no ST segment deviation noted during stress.  No T wave inversion was noted during stress.  Defect 1: There is a medium defect of mild severity present in the mid anteroseptal, mid inferolateral, apical anterior, apical septal and apical lateral location.  This is a low risk study.  Nuclear stress EF: 63%.  The left ventricular ejection fraction is normal (55-65%).  No prior study for comparison.  There is a mild to moderate defect extending from mid anteroseptum to inferolateral wall. It improves with stress, which is not consistent with ischemia. Normal wall motion in this area. Not consistent with single coronary distrubution. However, TID is 1.21, which means balanced ischemia in multiple territories. cannot be excluded        Scheduled Meds:  [START ON 07/02/2019] amLODipine  5 mg Oral Daily   calcium carbonate  1  tablet Oral Daily   carvedilol  6.25 mg Oral BID WC   cefdinir  300 mg Oral Q12H   cholecalciferol  1,000 Units Oral Daily   furosemide  40 mg Intravenous Daily   hydrALAZINE  50 mg Oral Q8H   isosorbide mononitrate  30 mg Oral Daily   potassium chloride  40 mEq Oral Q4H   sertraline  50 mg Oral Daily   simvastatin  20 mg Oral Daily   traZODone  100 mg Oral QHS   Continuous Infusions:  sodium chloride 250 mL (06/27/19 1013)     LOS: 6 days    Time spent: 35 minutes    Irine Seal, MD [facility]  If 7PM-7AM, please contact night-coverage [email or web address] 07/01/2019, 12:04 PM

## 2019-07-01 NOTE — Progress Notes (Addendum)
Progress Note  Patient Name: [name] Date of Encounter: 07/01/2019  Primary Cardiologist: New  Subjective   Feels much better. Dyspnea resolved. Denies CP.   Inpatient Medications    Scheduled Meds: . amLODipine  10 mg Oral Daily  . calcium carbonate  1 tablet Oral Daily  . cefdinir  300 mg Oral Q12H  . cholecalciferol  1,000 Units Oral Daily  . furosemide  40 mg Intravenous Daily  . hydrALAZINE  25 mg Oral Q8H  . sertraline  50 mg Oral Daily  . simvastatin  20 mg Oral Daily  . traZODone  100 mg Oral QHS   Continuous Infusions: . sodium chloride 250 mL (06/27/19 1013)   PRN Meds: sodium chloride, acetaminophen **OR** acetaminophen, hydrALAZINE, ondansetron **OR** ondansetron (ZOFRAN) IV   Vital Signs    Vitals:   06/30/19 1220 06/30/19 2139 07/01/19 0500 07/01/19 0522  BP: (!) 146/66 (!) 158/73  135/63  Pulse: 87 89    Resp: 18 16    Temp: 97.8 F (36.6 C) 98.1 F (36.7 C)    TempSrc: Oral Oral    SpO2: 90% 97%    Weight:   82.3 kg   Height:        Intake/Output Summary (Last 24 hours) at 07/01/2019 0755 Last data filed at 07/01/2019 0540 Gross per 24 hour  Intake 745.61 ml  Output 1600 ml  Net -854.39 ml   Last 3 Weights 07/01/2019 06/30/2019 06/29/2019  Weight (lbs) 181 lb 8 oz 186 lb 1.1 oz 189 lb 13.1 oz  Weight (kg) 82.328 kg 84.4 kg 86.1 kg      Telemetry    NSR 80s - Personally Reviewed  ECG    No new EKG to review today - Personally Reviewed  Physical Exam   GEN: No acute distress.   Neck: No JVD Cardiac: RRR, no murmurs, rubs, or gallops.  Respiratory: Clear to auscultation bilaterally. GI: Soft, nontender, non-distended  MS: No edema; No deformity. Neuro:  Nonfocal  Psych: Normal affect   Labs    High Sensitivity Troponin:   Recent Labs  Lab 06/28/19 0947 06/28/19 1146  TROPONINIHS 865* 1,308*      Chemistry Recent Labs  Lab 06/24/19 1848  06/29/19 0544 06/30/19 0738 07/01/19 0443  NA 143   < > 144 144 141   K <2.0*   < > 2.8* 3.4* 3.0*  CL 94*   < > 99 100 97*  CO2 32   < > 30 31 31   GLUCOSE 157*   < > 89 96 103*  BUN 17   < > 22 22 19   CREATININE 2.54*   < > 1.83* 1.72* 1.56*  CALCIUM 7.5*   < > 8.7* 8.9 8.8*  PROT 6.7  --  6.7  --   --   ALBUMIN 3.7  --  3.7  --   --   AST 27  --  47*  --   --   ALT 17  --  42  --   --   ALKPHOS 50  --  49  --   --   BILITOT 0.7  --  0.8  --   --   GFRNONAA 18*   < > 27* 29* 33*  GFRAA 21*   < > 32* 34* 38*  ANIONGAP 17*   < > 15 13 13    < > = values in this interval not displayed.     Hematology Recent Labs  Lab 06/28/19 0350  06/29/19 0544 06/30/19 0738  WBC 12.2* 10.9* 9.1  RBC 3.03* 2.97* 3.29*  HGB 9.2* 9.2* 10.1*  HCT 30.5* 29.0* 32.3*  MCV 100.7* 97.6 98.2  MCH 30.4 31.0 30.7  MCHC 30.2 31.7 31.3  RDW 15.9* 15.4 15.0  PLT 220 199 261    BNP Recent Labs  Lab 06/25/19 0622  BNP 159.7*     DDimer No results for input(s): DDIMER in the last 168 hours.   Radiology    Nm Myocar Multi W/spect W/wall Motion / Ef  Result Date: 06/30/2019  There was no ST segment deviation noted during stress.  No T wave inversion was noted during stress.  Defect 1: There is a medium defect of mild severity present in the mid anteroseptal, mid inferolateral, apical anterior, apical septal and apical lateral location.  This is a low risk study.  Nuclear stress EF: 63%.  The left ventricular ejection fraction is normal (55-65%).  No prior study for comparison.  There is a mild to moderate defect extending from mid anteroseptum to inferolateral wall. It improves with stress, which is not consistent with ischemia. Normal wall motion in this area. Not consistent with single coronary distrubution. However, TID is 1.21, which means balanced ischemia in multiple territories. cannot be excluded    Cardiac Studies   2D Echo 06/28/19  Left ventricular ejection fraction, by visual estimation, is 40-45% with diffuse hypokinesis. The left ventricle has  normal function. Normal left ventricular size. There is moderately increased left ventricular hypertrophy. 2. Elevated left ventricular end-diastolic pressure. 3. Left ventricular diastolic Doppler parameters are consistent with impaired relaxation pattern of LV diastolic filling. 4. Global right ventricle has normal systolic function.The right ventricular size is normal. No increase in right ventricular wall thickness. 5. Left atrial size was mildly dilated. 6. Right atrial size was normal. 7. Trivial pericardial effusion is present. 8. The mitral valve is normal in structure. Moderate mitral valve regurgitation. No evidence of mitral stenosis. 9. The tricuspid valve is normal in structure. Tricuspid valve regurgitation is mild. 10. The aortic valve is normal in structure. Aortic valve regurgitation was not visualized by color flow Doppler. Mild aortic valve sclerosis without stenosis. 11. The pulmonic valve was normal in structure. Pulmonic valve regurgitation is mild by color flow Doppler. 12. The inferior vena cava is normal in size with greater than 50% respiratory variability, suggesting right atrial pressure of 3 mmHg. 13. Normal pulmonary artery systolic pressure.  NST 06/30/19   There was no ST segment deviation noted during stress.  No T wave inversion was noted during stress.  Defect 1: There is a medium defect of mild severity present in the mid anteroseptal, mid inferolateral, apical anterior, apical septal and apical lateral location.  This is a low risk study.  Nuclear stress EF: 63%.  The left ventricular ejection fraction is normal (55-65%).  No prior study for comparison.   There is a mild to moderate defect extending from mid anteroseptum to inferolateral wall. It improves with stress, which is not consistent with ischemia. Normal wall motion in this area. Not consistent with single coronary distrubution. However, TID is 1.21, which means balanced ischemia in  multiple territories. cannot be excluded  Patient Profile     Ms. Betschart is a 73 y/o female w/ a h/o hypertension, hyperlipidemia, renal cell carcinoma status post right nephrectomy (follows with [name]) and [facility] [employer] was referred to the ED by her PCP given worsening renal function with creatinine noted at2.3.  (baseline 1.1). The  patient has had worsening SOB and was hypoxic in the ED and found to be volume overloaded and treated w/ Lasix. Subsequent 2D echo showed mildly reduced LV systolic function w/ EF at 40-45%, diffuse hypokinesis and moderate mitral regurgitation, for which cardiology was consulted.   Assessment & Plan   1. Acute Combined Systolic and Diastolic CHF: diuresing w/ IV Lasix. Net I/Os -6.6L. Weights variable. Doubt accurate. Breathing improved. Still w/ faint crackles at the bases, L>R.  Continue IV diuretics  Continue strict I/Os  Continue to closely monitor renal function and electrolytes w/ diuresis   2. Cardiomyopathy: new diagnosis. Echo shows mildly reduced LVEF, 40-45% w/ diffuse hypokinesis.  NST 06/30/19 w/ a medium defect of mild severity present in the mid anteroseptal, mid inferolateral, apical anterior, apical septal and apical lateral location. It improves with stress, which is not consistent with ischemia. Normal wall motion in this area. Not consistent with single coronary distrubution. However, TID is 1.21, which means balanced ischemia in multiple territories cannot be excluded.   ? definitive LHC vs coronary CTA to exclude coronary ischemia, however would be hesitant given baseline renal function w/ solitary kidney (of note, hs troponin: 865>>1,308 and admission EKG w/ slight lateral ST depressions). No CP. Will defer further ischemic w/u to MD.  Treat w/ guidelines directed therapy. Abnormal renal function limits use of ACE/ARB/ARNI  Continue hydralazine for afterload reduction, in place of ACE/ARB  Consider addition of  blocker (baseline HR  in the 80s)   3. Acute Renal Failure: h/o renal cell carcinoma status post right nephrectomy (follows with [name]). Baseline SCr 1.1. Admit SCr 2.01.  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   SCr improving, 2.3>>1.83>>1.72>>1.56  Further management per urology   4. Hypokalemia: 3.0 today. In the setting of IV diuretics  Give supplemental K, Kdur 40 mEq x 1  F/u BMP in the AM   5. HLD: recent lipid panel w/ LDL at 80 mg/dL  Continue simvastatin     For questions or updates, please contact [place] Please consult [email or web address] for contact info under        Signed, Lyda Jester, PA-C  07/01/2019, 7:55 AM     Attending Note:   The patient was seen and examined.  Agree with assessment and plan as noted above.  Changes made to the above note as needed.  Patient seen and independently examined with Ellen Henri, PA .   We discussed all aspects of the encounter. I agree with the assessment and plan as stated above.  1.   Hypertensive urgency: Patient has had hypertension at home and during this hospitalization.  She presented with increasing shortness of breath and volume overload.  She is diuresed and renal function has improved.  She has mild LV dysfunction on echo.  The Myoview study did not reveal a specific area of ischemia but we could not rule out balanced ischemia from three-vessel disease.  Troponin levels were mildly elevated.  This is could be due to an acute coronary  syndrome but also could be due to demand ischemia.  Because of her mildly depressed LV function, we will reduce amlodipine with thought of discontinuing it altogether if possible.  We will increase the hydralazine.  We have added isosorbide.  If her creatinine improves, will consider adding ARB.  2.  Acute  on chronic combined  systolic and diastolic congestive heart failure: Ejection fraction is 40 to 45% by echo. Will gradually decrease the amlodipine.  We have increased hydralazine and added isosorbide.  Continue Lasix for now.  She eats a fairly high salt diet.  We have counseled her about reducing the salt in her diet.  She still eats bologna and canned goods on an almost daily basis.  3.  Chronic kidney disease: She is status post right nephrectomy.  Creatinine has generally improved through the admission.  4.  Possible CAD: Had positive troponin levels on admission.  This could be due to demand ischemia.  Ideally, we would wait until her creatinine improves further before we consider heart catheterization.  We will continue to follow her every day.  Will consider cardiac cath as an outpatient if we do not refer her for cath during this admission.   I have spent a total of 40 minutes with patient reviewing hospital  notes , telemetry, EKGs, labs and examining patient as well as establishing an assessment and plan that was discussed with the patient. > 50% of time was spent in direct patient care.    Thayer Headings, Brooke Bonito., MD, [facility] 07/01/2019, 11:38 AM [address]  [place] Pager [phone]

## 2019-07-02 ENCOUNTER — Other Ambulatory Visit: Payer: Self-pay | Admitting: Cardiology

## 2019-07-02 DIAGNOSIS — Z5181 Encounter for therapeutic drug level monitoring: Secondary | ICD-10-CM

## 2019-07-02 LAB — CBC
HCT: 34.3 % — ABNORMAL LOW (ref 36.0–46.0)
Hemoglobin: 10.5 g/dL — ABNORMAL LOW (ref 12.0–15.0)
MCH: 30.3 pg (ref 26.0–34.0)
MCHC: 30.6 g/dL (ref 30.0–36.0)
MCV: 99.1 fL (ref 80.0–100.0)
Platelets: 348 10*3/uL (ref 150–400)
RBC: 3.46 MIL/uL — ABNORMAL LOW (ref 3.87–5.11)
RDW: 15.1 % (ref 11.5–15.5)
WBC: 9.9 10*3/uL (ref 4.0–10.5)
nRBC: 0 % (ref 0.0–0.2)

## 2019-07-02 LAB — BASIC METABOLIC PANEL
Anion gap: 9 (ref 5–15)
BUN: 19 mg/dL (ref 8–23)
CO2: 33 mmol/L — ABNORMAL HIGH (ref 22–32)
Calcium: 9.3 mg/dL (ref 8.9–10.3)
Chloride: 98 mmol/L (ref 98–111)
Creatinine, Ser: 1.77 mg/dL — ABNORMAL HIGH (ref 0.44–1.00)
GFR calc Af Amer: 33 mL/min — ABNORMAL LOW (ref >60)
GFR calc non Af Amer: 28 mL/min — ABNORMAL LOW (ref >60)
Glucose, Bld: 103 mg/dL — ABNORMAL HIGH (ref 70–99)
Potassium: 4 mmol/L (ref 3.5–5.1)
Sodium: 140 mmol/L (ref 135–145)

## 2019-07-02 MED ORDER — HYDRALAZINE HCL 50 MG PO TABS: 50.0000 mg | ORAL_TABLET | Freq: Three times a day (TID) | ORAL | 1 refills | Status: DC

## 2019-07-02 MED ORDER — SPIRONOLACTONE 25 MG PO TABS
25.0000 mg | ORAL_TABLET | Freq: Every day | ORAL | Status: DC
  Administered 2019-07-02: 25 mg via ORAL
  Filled 2019-07-02: qty 1

## 2019-07-02 MED ORDER — SPIRONOLACTONE 25 MG PO TABS: 25.0000 mg | ORAL_TABLET | Freq: Every day | ORAL | 1 refills | Status: DC

## 2019-07-02 MED ORDER — CARVEDILOL 6.25 MG PO TABS: 6.2500 mg | ORAL_TABLET | Freq: Two times a day (BID) | ORAL | 1 refills | Status: DC

## 2019-07-02 MED ORDER — AMLODIPINE BESYLATE 5 MG PO TABS: 5.0000 mg | ORAL_TABLET | Freq: Every day | ORAL | 1 refills | Status: DC

## 2019-07-02 MED ORDER — CALCIUM CARBONATE ANTACID 500 MG PO CHEW: 1.0000 | CHEWABLE_TABLET | Freq: Every day | ORAL | 1 refills | Status: AC

## 2019-07-02 MED ORDER — TORSEMIDE 20 MG PO TABS: 20.0000 mg | ORAL_TABLET | Freq: Every day | ORAL | Status: DC

## 2019-07-02 MED ORDER — ISOSORBIDE MONONITRATE ER 30 MG PO TB24: 30.0000 mg | ORAL_TABLET | Freq: Every day | ORAL | 1 refills | Status: DC

## 2019-07-02 NOTE — Discharge Summary (Signed)
Physician Discharge Summary [facility]    Patient: [name]                   Admit date: 06/24/2019   DOB: [birth year]             Discharge date:07/02/2019/1:06 PM [id number]                          PCP: [name], MD  Disposition: HOME   Recommendations for Outpatient Follow-up:    Follow up: in 1 week  Discharge Condition: Stable   Code Status:   Code Status: Full Code  Diet recommendation: Cardiac diet   Discharge Diagnoses:    Principal Problem:   AKI (acute kidney injury) ([place]) Active Problems:   Hypertension   Hyperlipidemia   Depression   Hypokalemia   Hypomagnesemia   Bilateral leg edema   Diarrhea   Hypoxia   Acute respiratory failure with hypoxia (H[facility])   Acute systolic CHF (congestive heart failure) ([place])   Elevated troponin   History of Present Illness/ Hospital Course [name] Summary:   [name] is a73 year old female withPMHxsignificant for hypertension, hyperlipidemia, renal cell carcinoma status post right nephrectomy (follows with [name]), IBSwho was referred to the ED by her PCP given worsening renal function with creatinine noted at2.3 (baseline 1.1). Patient reports some diarrhea.   Renal ultrasound did show left kidney hydronephrosis, with subsequent renal stone,showing possible mass/hemorrhage in the ureter. Urology consultedand EDP requestedhospital service for admission.   Detailed discharge summary as below:    acute respiratory failure with hypoxia Patient early on in the hospitalization noted to have developed hypoxia following aggressive IV fluid resuscitation during initial presentation.  Chest x-ray which was done was consistent with pulmonary edema patient also noted to have lower extremity edema.  Patient received IV Lasix on 06/26/2019 with good urine output and clinical improvement and was titrated back to room air. Overnight on 06/27/2019 and early the morning of 06/28/2019  patient noted to have significant hypoxia with sats dropping in the 50s per RN with increased O2 requirements up to 6 L nasal cannula to keep sats greater than 92%.  Concern for volume overload.  Came and assessed patient and patient noted to have diffuse crackles on examination.  Patient with some trace to 1+ pedal edema.  Chest x-ray ordered consistent with volume overload and possible infiltrate.  Patient afebrile.  Patient also noted with a leukocytosis.  Cycle cardiac enzymes and first set of cardiac enzymes elevated at [phone] [id number]..  EKG done with slight ST depression in leads V4 through V6.  Patient denied any ongoing chest pain.  2D echo done with EF of 40 to 45% with diffuse hypokinesis.  Cardiology consulted due to elevated troponin levels in the setting of hypoxia and probable acute CHF exacerbation.  Case was discussed with urology Heather. Jeffie [name] and patient placed on IV heparin without a bolus as patient with recent instrumentation with biopsy.  Monitor closely for bleeding.  Patient with good urine output of 1.6 L over the past 24 hours after being started on Lasix 40 mg IV daily.  Due to concerns for possible pulmonary infiltrate urinary Legionella antigen ordered which is pending, urine is antigen negative.  IV Rocephin and azithromycin has been transitioned to oral Vantin which we will continue through tomorrow and subsequently discontinued.  No further antibiotics will be needed.  Continue IV Lasix and cardiac medications.  Strict I's and  O's.  Daily weights.  Patient underwent stress Myoview test see problem #3.  Cardiology following and I appreciate the input and recommendations.    Acute combined systolic and diastolic CHF exacerbation Patient noted to have significant hypoxia overnight and this morning requiring 6 L of nasal cannula.  Patient was not O2 dependent prior to admission.  First set of cardiac enzymes elevated.  We will continue serial cardiac enzymes.  2D echo with a a EF of 40 to 45%  with diffuse hypokinesis.  Patient with a urine output of 1.6 L over the past 24 hours.  Patient still with some bibasilar crackles noted on examination which is improved.  Continue Lasix 40 mg IV daily.  Norvasc dose has been decreased.  Hydralazine dose increased per cardiology and isosorbide added.  Strict I's and O's.  Daily weights.  See #3.  Per cardiology.   Elevated troponin/rule out ACS/abnormal 2D echo/cardiomyopathy Questionable etiology.  Patient with history of hypertension, hyperlipidemia, prior tobacco use.  Patient noted to have sudden onset hypoxia overnight.  Patient denied any ongoing chest pain.  EKG done in sinus rhythm with slight ST depression in leads V4 - V6.  2D echo with a EF of 40 to 45% with diffuse hypokinesis.  Patient started on IV heparin on 06/28/2019 with no bolus as patient with recent cystoscopy and biopsies done.  Patient with no bleeding.  Patient seen in consultation by cardiology and patient underwent Myoview stress test on 06/30/2019 which medium defect of mild severity present in the mid anterior septal, mid inferior lateral, apical anterior, apical septal and apical lateral location which improved with stress and per cardiology not consistent with ischemia.  Normal wall motion.  Cardiology states patient has a TID of 1.21 meaning balanced ischemia multiple territories cannot be excluded.  Patient noted to have a elevated troponin levels with slight ST lateral depression.  Patient noted to have a high salt diet and as such we will have nutrition for diet education.  Patient's medications have been adjusted per cardiology with a decreasing Norvasc, increasing hydralazine, addition of isosorbide while monitoring renal function.  Unable to place on ACE inhibitor due to renal function.  Patient on IV Lasix.  Per cardiology.    Hypokalemia/hypomagnesemia Magnesiu at 1.8.  Potassium at 3.0 today.  Likely secondary to diuresis.  K. Dur 40 mEq p.o. every 4 hours x2 doses.  IV  magnesium.  Keep potassium greater than 4.  Keep magnesium greater than 2.    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  Renal ultrasound done concerning for hydronephrosis of solitary left kidney.  CT stone protocol was done that showed mild left hydroureteronephrosis to the level of the pelvis with appearance of soft tissue density within the ureter consistent with hemorrhage versus soft tissue mass such as a urothelial malignancy.  Urology was consulted and patient transferred to [facility] for further evaluation with cystoscopy.  Patient subsequently underwent cystoscopy with left retrograde pyelogram and interpretation, left ureteroscopy with biopsy of left ureteral neoplasm insertion of left double-J stent.  Patient with a urine output of 1.6 L over the past 24 hours.  Creatinine  trending down and currently at 1.56.  Monitor with diuresis.  Per urology.   Hyperlipidemia Cont. Statin.    History of IBS/chronic diarrhea Stable  Patient with history  of chronic diarrhea not on treatment in the outpatient setting.  Outpatient follow-up with gastroenterology.   Depression Continue sertraline and trazodone.     Hypertension -Improved with current medication, Norvasc, hydralazine, few dose of Lasix, isosorbide,  Due to depressed LV function on 2D echo cardiology has decreased patient's amlodipine with a goal of discontinuing it altogether.  Hydralazine dose has been increased per cardiology and isosorbide added to patient's regimen.    DVT prophylaxis: Lovenox Code Status: Full Family Communication: Updated patient.  No family at bedside. Disposition Plan:  Home   Consultants:   Urology: Heather. Matilde Sprang 06/25/2019  Cardiology: Heather. Lovena Le 06/29/2019  Procedures:   2D echo   06/28/2019  CT renal stone protocol 06/25/2019  Renal ultrasound 06/25/2019  Chest x-ray 06/25/2019, 06/26/2019, 06/27/2019, 06/28/2019  Cystoscopy with left retrograde pyelogram and interpretation, left uteroscopy with biopsy of left ureteral neoplasm/insertion of left double-J stent per Heather. Jeffie [name] 06/27/2019  Stress test 06/30/2019  Antimicrobials:   IV Rocephin 06/28/2019 >>>>>> 06/30/2019  IV azithromycin 06/28/2019>>>>>> 06/30/2019  Oral Vantin 06/30/2019 >>>>>> 07/02/2019   Discharge Instructions:   Discharge Instructions    ([place]) Call MD:  Anytime you have any of the following symptoms: 1) 3 pound weight gain in 24 hours or 5 pounds in 1 week 2) shortness of breath, with or without a dry hacking cough 3) swelling in the hands, feet or stomach 4) if you have to sleep on extra pillows at night in order to breathe.   Complete by: As directed    Activity as tolerated - No restrictions   Complete by: As directed    Diet - low sodium heart healthy   Complete by: As directed    Discharge instructions   Complete by: As directed    Increase activity slowly   Complete by: As directed    Increase activity slowly   Complete by: As directed        Medication List    STOP taking these medications   acetaminophen 500 MG tablet Commonly known as: TYLENOL   calcium carbonate 200 MG capsule   cephALEXin 500 MG capsule Commonly known as: KEFLEX     TAKE these medications   amLODipine 5 MG tablet Commonly known as: NORVASC Take 1 tablet (5 mg total) by mouth daily. Start taking on: July 03, 2019   calcium carbonate 500 MG chewable tablet Commonly known as: TUMS - dosed in mg elemental calcium Chew 1 tablet (200 mg of elemental calcium total) by mouth daily. Start taking on: July 03, 2019   carvedilol 6.25 MG tablet Commonly known as: COREG Take 1 tablet (6.25 mg total) by mouth 2 (two) times daily with a meal.   cholecalciferol 1000 units  tablet Commonly known as: VITAMIN D Take 1,000 Units by mouth daily.   hydrALAZINE 50 MG tablet Commonly known as: APRESOLINE Take 1 tablet (50 mg total) by mouth every 8 (eight) hours.   HYDROcodone-acetaminophen 5-325 MG tablet Commonly known as: NORCO/VICODIN Take 1 tablet by mouth every 6 (six) hours as needed for severe pain.   isosorbide mononitrate 30 MG 24 hr tablet Commonly known as: IMDUR Take 1 tablet (30 mg total) by mouth daily. Start taking on: July 03, 2019   sertraline 50 MG tablet Commonly known as: ZOLOFT Take 50 mg by mouth daily.   simvastatin 20 MG tablet Commonly known as: ZOCOR Take 20 mg by mouth daily.   spironolactone 25 MG tablet Commonly known as: ALDACTONE Take 1 tablet (  25 mg total) by mouth daily.   Tavaborole 5 % Soln Commonly known as: Kerydin Apply 1 drop each affected nail once daily for 12 months duration   traZODone 50 MG tablet Commonly known as: DESYREL Take 100 mg by mouth at bedtime.      Follow-up Information    Darcus Austin, MD .   Specialty: Family Medicine Contact information: [place] [address]        [facility].   Why: [name] will arrange the next procedure.  Contact information: [place]       Nahser, Wonda Cheng, MD Follow up.   Specialty: Cardiology Why: our office will call you with a hospital follow-up visit in 3-4 weeks with Heather. Elmarie Shiley PA or NP. You will also be scheduled for an appointment for repeat lab test in 1 week  Contact information: [place] [address] [phone]          Allergies  Allergen Reactions   Erythromycin Diarrhea and Nausea And Vomiting   Other     Unknown blood pressure pill caused severe coughing     Procedures /Studies:   US Renal  Result Date: 06/25/2019 CLINICAL DATA:  Acute kidney injury EXAM: RENAL /  URINARY TRACT ULTRASOUND COMPLETE COMPARISON:  05/10/2012 FINDINGS: Right Kidney: History of right nephrectomy.  No findings in the renal fossa Left Kidney: Renal measurements: 13 x 6 x 7 cm = volume: 300 mL. Mild-to-moderate hydronephrosis without visible obstructive process. Negative for collection. Bladder: Appears normal for degree of bladder distention. Inspection of bladder jet not noted. IMPRESSION: Hydronephrosis of the solitary left kidney. Electronically Signed   By: Monte Fantasia M.D.   On: 06/25/2019 05:33   Nm Myocar Multi W/spect W/wall Motion / Ef  Result Date: 06/30/2019  There was no ST segment deviation noted during stress.  No T wave inversion was noted during stress.  Defect 1: There is a medium defect of mild severity present in the mid anteroseptal, mid inferolateral, apical anterior, apical septal and apical lateral location.  This is a low risk study.  Nuclear stress EF: 63%.  The left ventricular ejection fraction is normal (55-65%).  No prior study for comparison.  There is a mild to moderate defect extending from mid anteroseptum to inferolateral wall. It improves with stress, which is not consistent with ischemia. Normal wall motion in this area. Not consistent with single coronary distrubution. However, TID is 1.21, which means balanced ischemia in multiple territories. cannot be excluded   Dg Chest Port 1 View  Result Date: 06/28/2019 CLINICAL DATA:  Shortness of breath this morning post renal stent placement 1 day prior. EXAM: PORTABLE CHEST 1 VIEW COMPARISON:  Chest x-rays dated 06/26/2019 06/25/2019. FINDINGS: Increasing opacity at the medial aspects of the RIGHT lung base. Mildly prominent interstitial markings bilaterally. Stable cardiomegaly. Stable elevation of the RIGHT hemidiaphragm no pneumothorax seen. IMPRESSION: 1. Increasing opacity at the medial aspects of the RIGHT lung base, suspicious for pneumonia, alternatively asymmetric pulmonary edema. 2. Stable  cardiomegaly. 3. Mildly prominent interstitial markings suggesting mild interstitial edema versus chronic interstitial lung disease. Electronically Signed   By: Franki Cabot M.D.   On: 06/28/2019 09:00   Dg Chest Port 1 View  Result Date: 06/26/2019 CLINICAL DATA:  73 year old female with hypoxia EXAM: PORTABLE CHEST 1 VIEW COMPARISON:  Chest radiograph dated 06/25/2019 FINDINGS: Prominence of the interstitial  markings primarily involving the mid to lower lung field compared to the prior radiograph may represent interval development of mild edema although pneumonia is not excluded. Clinical correlation is recommended. No focal consolidation, pleural effusion, pneumothorax. Stable cardiac silhouette. Atherosclerotic calcification of the aorta. No acute osseous pathology. IMPRESSION: Findings may represent interval development of mild edema although pneumonia is not excluded. Electronically Signed   By: Anner Crete M.D.   On: 06/26/2019 12:33   Dg Chest Port 1 View  Result Date: 06/25/2019 CLINICAL DATA:  Dyspnea EXAM: PORTABLE CHEST 1 VIEW COMPARISON:  08/24/2009, 06/25/2019 FINDINGS: The heart size is mildly enlarged, stable compared to prior CT. Calcific aortic knob. No focal airspace consolidation. No pleural effusion or pneumothorax. IMPRESSION: No acute cardiopulmonary findings. Electronically Signed   By: Davina Poke M.D.   On: 06/25/2019 13:44   Dg C-arm 1-60 Min-no Report  Result Date: 06/27/2019 Fluoroscopy was utilized by the requesting physician.  No radiographic interpretation.   Ct Renal Stone Study  Result Date: 06/25/2019 CLINICAL DATA:  Lower abdominal pain on both sides for 3 weeks EXAM: CT ABDOMEN AND PELVIS WITHOUT CONTRAST TECHNIQUE: Multidetector CT imaging of the abdomen and pelvis was performed following the standard protocol without IV contrast. COMPARISON:  07/10/2012 FINDINGS: Lower chest: No acute abnormality. Hepatobiliary: No focal liver abnormality is seen. No  gallstones, gallbladder wall thickening, or biliary dilatation. Pancreas: Unremarkable. No pancreatic ductal dilatation or surrounding inflammatory changes. Spleen: Normal in size without focal abnormality. Adrenals/Urinary Tract: Normal adrenal glands. Prior right nephrectomy. No left renal mass. Mild left hydroureteronephrosis to the level of the pelvis with there appears to be soft tissue density material within the ureter which may reflect hemorrhage or soft tissue mass such as urothelial malignancy. No urolithiasis. Normal bladder. Stomach/Bowel: Stomach is within normal limits. No evidence of bowel wall thickening, distention, or inflammatory changes. Diverticulosis without evidence of diverticulitis. Vascular/Lymphatic: Normal caliber abdominal aorta with mild atherosclerosis. No lymphadenopathy. Reproductive: Status post hysterectomy. No adnexal masses. Other: No abdominal wall hernia or abnormality. No abdominopelvic ascites. Musculoskeletal: No acute osseous abnormality. No aggressive degenerative disease with disc height loss at L2-3, L4-5 and L5-S1. Bilateral facet arthropathy of the lumbar spine. IMPRESSION: 1. Mild left hydroureteronephrosis to the level of the pelvis with there appears to be soft tissue density material within the ureter which may reflect hemorrhage or soft tissue mass such as urothelial malignancy. Recommend correlation with urinalysis and urology consultation. 2. Diverticulosis without evidence of diverticulitis. Electronically Signed   By: Kathreen Devoid   On: 06/25/2019 09:31     Subjective:   Patient was seen and examined 07/02/2019, 1:06 PM Patient stable today. No acute distress.  No issues overnight Stable for discharge.  Discharge Exam:    Vitals:   07/02/19 0219 07/02/19 0221 07/02/19 0425 07/02/19 0433  BP:   (!) 137/58   Pulse: 75 71 78   Resp: 18  18   Temp: 98.5 F (36.9 C)  98.4 F (36.9 C)   TempSrc: Oral  Oral   SpO2: (!) 89% 95% 93%   Weight:     81.8 kg  Height:        General: Pt lying comfortably in bed & appears in no obvious distress. Cardiovascular: S1 & S2 heard, RRR, S1/S2 +. No murmurs, rubs, gallops or clicks. No JVD or pedal edema. Respiratory: Clear to auscultation without wheezing, rhonchi or crackles. No increased work of breathing. Abdominal:  Non-distended, non-tender & soft. No organomegaly or masses appreciated. Normal  bowel sounds heard. CNS: Alert and oriented. No focal deficits. Extremities: no edema, no cyanosis    The results of significant diagnostics from this hospitalization (including imaging, microbiology, ancillary and laboratory) are listed below for reference.      Microbiology:   Recent Results (from the past 240 hour(s))  SARS CORONAVIRUS 2 (TAT 6-24 HRS) Nasopharyngeal Nasopharyngeal Swab     Status: None   Collection Time: 06/25/19  4:12 AM   Specimen: Nasopharyngeal Swab  Result Value Ref Range Status   SARS Coronavirus 2 NEGATIVE NEGATIVE Final    Comment: (NOTE) SARS-CoV-2 target nucleic acids are NOT DETECTED. The SARS-CoV-2 RNA is generally detectable in upper and lower respiratory specimens during the acute phase of infection. Negative results do not preclude SARS-CoV-2 infection, do not rule out co-infections with other pathogens, and should not be used as the sole basis for treatment or other patient management decisions. Negative results must be combined with clinical observations, patient history, and epidemiological information. The expected result is Negative. Fact Sheet for Patients: SugarRoll.be Fact Sheet for Healthcare Providers: https://www.woods-mathews.com/ This test is not yet approved or cleared by the [place] FDA and  has been authorized for detection and/or diagnosis of SARS-CoV-2 by FDA under an Emergency Use Authorization (EUA). This EUA will remain  in effect (meaning this test can be used) for the duration of  the COVID-19 declaration under Section 56 4(b)(1) of the Act, 21 U.S.C. section 360bbb-3(b)(1), unless the authorization is terminated or revoked sooner. Performed at [facility], [place] [address] [place], [place] [address]   Culture, blood (routine x 2) Call MD if unable to obtain prior to antibiotics being given     Status: None (Preliminary result)   Collection Time: 06/28/19  9:54 AM   Specimen: BLOOD RIGHT HAND  Result Value Ref Range Status   Specimen Description   Final    BLOOD RIGHT HAND Performed at [facility], [place] [address] [place], [place] [address]    Special Requests   Final    BOTTLES DRAWN AEROBIC ONLY Blood Culture adequate volume Performed at [facility] [address] [place], [place] [address]    Culture   Final    NO GROWTH 4 DAYS Performed at [facility], [place] [address], [place], [place] [address]    Report Status PENDING  Incomplete  Culture, blood (routine x 2) Call MD if unable to obtain prior to antibiotics being given     Status: None (Preliminary result)   Collection Time: 06/28/19  9:54 AM   Specimen: BLOOD  Result Value Ref Range Status   Specimen Description   Final    BLOOD RIGHT ANTECUBITAL Performed at [facility] [address] [place], [place] [address]    Special Requests   Final    BOTTLES DRAWN AEROBIC ONLY Blood Culture adequate volume Performed at [facility] [address] [place], [place] [address]    Culture   Final    NO GROWTH 4 DAYS Performed at [facility], [place] [address], [place], [place] [address]    Report Status PENDING  Incomplete     Labs:   CBC: Recent Labs  Lab 06/26/19 0535 06/27/19 0422 06/28/19 0350 06/29/19 0544 06/30/19 0738 07/02/19 0432  WBC 11.9* 10.7* 12.2* 10.9* 9.1 9.9  NEUTROABS 8.6*  --   --   --   --   --   HGB 10.4* 10.0* 9.2*  9.2* 10.1* 10.5*  HCT 31.7* 30.2* 30.5* 29.0* 32.3* 34.3*  MCV 93.5  94.4 100.7* 97.6 98.2 99.1  PLT 227 212 220 199 261 0000000   Basic Metabolic Panel: Recent Labs  Lab 06/27/19 0734  06/28/19 0350 06/29/19 0544 06/30/19 0738 07/01/19 0443 07/02/19 0432  NA  --   --  141 144 144 141 140  K  --    < > 3.3* 2.8* 3.4* 3.0* 4.0  CL  --   --  100 99 100 97* 98  CO2  --   --  26 30 31 31  33*  GLUCOSE  --   --  132* 89 96 103* 103*  BUN  --   --  15 22 22 19 19   CREATININE  --   --  1.86* 1.83* 1.72* 1.56* 1.77*  CALCIUM  --   --  8.5* 8.7* 8.9 8.8* 9.3  MG 1.6*  --  2.2 1.7 2.2 1.8  --    < > = values in this interval not displayed.   Liver Function Tests: Recent Labs  Lab 06/29/19 0544  AST 47*  ALT 42  ALKPHOS 49  BILITOT 0.8  PROT 6.7  ALBUMIN 3.7   BNP (last 3 results) Recent Labs    06/25/19 0622  BNP 159.7*   Cardiac Enzymes: No results for input(s): CKTOTAL, CKMB, CKMBINDEX, TROPONINI in the last 168 hours. CBG: Recent Labs  Lab 06/28/19 0522  GLUCAP 126*   Hgb A1c No results for input(s): HGBA1C in the last 72 hours. Lipid Profile Recent Labs    06/30/19 0738 07/01/19 0443  CHOL 161 150  HDL 49 47  LDLCALC 88 80  TRIG 119 117  CHOLHDL 3.3 3.2   Urinalysis    Component Value Date/Time   COLORURINE STRAW (A) 06/25/2019 0319   APPEARANCEUR CLEAR 06/25/2019 0319   LABSPEC 1.005 06/25/2019 0319   PHURINE 7.0 06/25/2019 0319   GLUCOSEU NEGATIVE 06/25/2019 0319   HGBUR SMALL (A) 06/25/2019 0319   BILIRUBINUR NEGATIVE 06/25/2019 0319   KETONESUR NEGATIVE 06/25/2019 0319   PROTEINUR NEGATIVE 06/25/2019 0319   NITRITE NEGATIVE 06/25/2019 0319   LEUKOCYTESUR SMALL (A) 06/25/2019 0319    Time coordinating discharge: Over 45 minutes  SIGNED: Deatra James, MD, FACP, FHM. [facility],  Pager [phone]  If 7PM-7AM, please contact night-coverage [email or web address] [email or web address] [facility] 07/02/2019, 1:06 PM

## 2019-07-02 NOTE — Progress Notes (Signed)
Progress Note  Patient Name: [name] Date of Encounter: 07/02/2019  Primary Cardiologist: New  Subjective   Continues to feel well.   No further cp.   Has diuresed 8.7 liters so far this admission. Creatinine is up to 1. 78 this am   Inpatient Medications    Scheduled Meds: . amLODipine  5 mg Oral Daily  . calcium carbonate  1 tablet Oral Daily  . carvedilol  6.25 mg Oral BID WC  . cefdinir  300 mg Oral Q12H  . cholecalciferol  1,000 Units Oral Daily  . enoxaparin (LOVENOX) injection  30 mg Subcutaneous Q24H  . furosemide  40 mg Intravenous Daily  . hydrALAZINE  50 mg Oral Q8H  . isosorbide mononitrate  30 mg Oral Daily  . sertraline  50 mg Oral Daily  . simvastatin  20 mg Oral Daily  . traZODone  100 mg Oral QHS   Continuous Infusions: . sodium chloride 250 mL (06/27/19 1013)   PRN Meds: sodium chloride, acetaminophen **OR** acetaminophen, hydrALAZINE, ondansetron **OR** ondansetron (ZOFRAN) IV   Vital Signs    Vitals:   07/02/19 0219 07/02/19 0221 07/02/19 0425 07/02/19 0433  BP:   (!) 137/58   Pulse: 75 71 78   Resp: 18  18   Temp: 98.5 F (36.9 C)  98.4 F (36.9 C)   TempSrc: Oral  Oral   SpO2: (!) 89% 95% 93%   Weight:    81.8 kg  Height:        Intake/Output Summary (Last 24 hours) at 07/02/2019 1222 Last data filed at 07/02/2019 1000 Gross per 24 hour  Intake 450 ml  Output 2050 ml  Net -1600 ml   Last 3 Weights 07/02/2019 07/01/2019 06/30/2019  Weight (lbs) 180 lb 5.4 oz 181 lb 8 oz 186 lb 1.1 oz  Weight (kg) 81.8 kg 82.328 kg 84.4 kg      Telemetry    NSR - Personally Reviewed  ECG    No new EKG to review today - Personally Reviewed  Physical Exam   Physical Exam: Blood pressure (!) 137/58, pulse 78, temperature 98.4 F (36.9 C), temperature source Oral, resp. rate 18, height 5\' 3"  (1.6 m), weight 81.8 kg, SpO2 93 %.  GEN:  Well nourished, well developed in no acute distress HEENT: Normal NECK: No JVD; No carotid bruits  LYMPHATICS: No lymphadenopathy CARDIAC: RRR , no murmurs, rubs, gallops RESPIRATORY:  Clear to auscultation without rales, wheezing or rhonchi  ABDOMEN: Soft, non-tender, non-distended MUSCULOSKELETAL:  No edema; No deformity  SKIN: Warm and dry NEUROLOGIC:  Alert and oriented x 3   Labs    High Sensitivity Troponin:   Recent Labs  Lab 06/28/19 0947 06/28/19 1146  TROPONINIHS 865* 1,308*      Chemistry Recent Labs  Lab 06/29/19 0544 06/30/19 0738 07/01/19 0443 07/02/19 0432  NA 144 144 141 140  K 2.8* 3.4* 3.0* 4.0  CL 99 100 97* 98  CO2 30 31 31  33*  GLUCOSE 89 96 103* 103*  BUN 22 22 19 19   CREATININE 1.83* 1.72* 1.56* 1.77*  CALCIUM 8.7* 8.9 8.8* 9.3  PROT 6.7  --   --   --   ALBUMIN 3.7  --   --   --   AST 47*  --   --   --   ALT 42  --   --   --   ALKPHOS 49  --   --   --   BILITOT 0.8  --   --   --  GFRNONAA 27* 29* 33* 28*  GFRAA 32* 34* 38* 33*  ANIONGAP 15 13 13 9      Hematology Recent Labs  Lab 06/29/19 0544 06/30/19 0738 07/02/19 0432  WBC 10.9* 9.1 9.9  RBC 2.97* 3.29* 3.46*  HGB 9.2* 10.1* 10.5*  HCT 29.0* 32.3* 34.3*  MCV 97.6 98.2 99.1  MCH 31.0 30.7 30.3  MCHC 31.7 31.3 30.6  RDW 15.4 15.0 15.1  PLT 199 261 348    BNP No results for input(s): BNP, PROBNP in the last 168 hours.   DDimer No results for input(s): DDIMER in the last 168 hours.   Radiology    Nm Myocar Multi W/spect W/wall Motion / Ef  Result Date: 06/30/2019  There was no ST segment deviation noted during stress.  No T wave inversion was noted during stress.  Defect 1: There is a medium defect of mild severity present in the mid anteroseptal, mid inferolateral, apical anterior, apical septal and apical lateral location.  This is a low risk study.  Nuclear stress EF: 63%.  The left ventricular ejection fraction is normal (55-65%).  No prior study for comparison.  There is a mild to moderate defect extending from mid anteroseptum to inferolateral wall. It  improves with stress, which is not consistent with ischemia. Normal wall motion in this area. Not consistent with single coronary distrubution. However, TID is 1.21, which means balanced ischemia in multiple territories. cannot be excluded    Cardiac Studies   2D Echo 06/28/19  Left ventricular ejection fraction, by visual estimation, is 40-45% with diffuse hypokinesis. The left ventricle has normal function. Normal left ventricular size. There is moderately increased left ventricular hypertrophy. 2. Elevated left ventricular end-diastolic pressure. 3. Left ventricular diastolic Doppler parameters are consistent with impaired relaxation pattern of LV diastolic filling. 4. Global right ventricle has normal systolic function.The right ventricular size is normal. No increase in right ventricular wall thickness. 5. Left atrial size was mildly dilated. 6. Right atrial size was normal. 7. Trivial pericardial effusion is present. 8. The mitral valve is normal in structure. Moderate mitral valve regurgitation. No evidence of mitral stenosis. 9. The tricuspid valve is normal in structure. Tricuspid valve regurgitation is mild. 10. The aortic valve is normal in structure. Aortic valve regurgitation was not visualized by color flow Doppler. Mild aortic valve sclerosis without stenosis. 11. The pulmonic valve was normal in structure. Pulmonic valve regurgitation is mild by color flow Doppler. 12. The inferior vena cava is normal in size with greater than 50% respiratory variability, suggesting right atrial pressure of 3 mmHg. 13. Normal pulmonary artery systolic pressure.  NST 06/30/19   There was no ST segment deviation noted during stress.  No T wave inversion was noted during stress.  Defect 1: There is a medium defect of mild severity present in the mid anteroseptal, mid inferolateral, apical anterior, apical septal and apical lateral location.  This is a low risk study.  Nuclear stress  EF: 63%.  The left ventricular ejection fraction is normal (55-65%).  No prior study for comparison.   There is a mild to moderate defect extending from mid anteroseptum to inferolateral wall. It improves with stress, which is not consistent with ischemia. Normal wall motion in this area. Not consistent with single coronary distrubution. However, TID is 1.21, which means balanced ischemia in multiple territories. cannot be excluded  Patient Profile     Ms. Filler is a 73 y/o female w/ a h/o hypertension, hyperlipidemia, renal cell carcinoma status post  right nephrectomy (follows with [name]) and [facility] [employer] was referred to the ED by her PCP given worsening renal function with creatinine noted at2.3.  (baseline 1.1). The patient has had worsening SOB and was hypoxic in the ED and found to be volume overloaded and treated w/ Lasix. Subsequent 2D echo showed mildly reduced LV systolic function w/ EF at 40-45%, diffuse hypokinesis and moderate mitral regurgitation, for which cardiology was consulted.   Assessment & Plan    1. Acute Combined Systolic and Diastolic CHF:    She is really doing well..  Feeling much better  She had a low risk Myoview study.  There was no definite area of ischemia.  There was some concern that balanced ischemia could not be ruled out but she overall is doing much better on medical therapy.  Her creatinine is 1.7 today and I do not want to refer her for heart catheterization with this moderate chronic kidney disease.  We will continue with medical therapy.  I will see her in the office and follow her for her heart failure.  If she has worsening symptoms we can always consider heart catheterization in the future.  From my standpoint she should be able to go home today. Will DC IV lasix Start spirnolactone 25 mg a day . Will need to watch her renal function and potassium closely Will add torsemide if needed for additional diuresis    2. Cardiomyopathy:    3.  Acute Renal Failure: h/o renal cell carcinoma status post right nephrectomy (follows with [name]).   Creatinine   4. Hypokalemia:   Have added spironolactone   5. HLD:      For questions or updates, please contact [place] Please consult [email or web address] for contact info under        Signed, Mertie Moores, MD  07/02/2019, 12:22 PM

## 2019-07-02 NOTE — Progress Notes (Signed)
Nutrition Education Note RD working remotely.   RD consulted for nutrition education. Patient has a history of CHF (current acute exacerbation) and HTN.  Will place handouts from the [employer] in Discharge Instructions: "Low Sodium Nutrition Therapy," "Sodium Free Flavoring Tips," and "Sodium Content of Foods."   Reviewed patient's dietary recall. Provided examples on ways to decrease sodium intake in diet. Discouraged intake of processed foods and use of salt shaker. Encouraged fresh fruits and vegetables as well as whole grain sources of carbohydrates to maximize fiber intake.   RD discussed why it is important for patient to adhere to diet recommendations, and emphasized the role of fluids, foods to avoid, and importance of weighing self daily. Teach back method used.  Expect fair compliance.  Body mass index is 31.95 kg/m. Pt meets criteria for obesity based on current BMI.  Current diet order is Heart Healthy, patient is consuming 25-75% of meals at this time. Weight has been stable/slightly up over the past two and a half years and patient's MST screen is 0. Labs and medications reviewed.   No further nutrition interventions warranted at this time. RD contact information provided. If additional nutrition issues arise, please re-consult RD.      Jarome Matin, MS, RD, LDN, [facility] Inpatient Clinical Dietitian Pager # [phone] After hours/weekend pager # [phone]

## 2019-07-02 NOTE — Discharge Instructions (Signed)
Ureteral Stent Implantation, Care After °This sheet gives you information about how to care for yourself after your procedure. Your health care provider may also give you more specific instructions. If you have problems or questions, contact your health care provider. °What can I expect after the procedure? °After the procedure, it is common to have: °· Nausea. °· Mild pain when you urinate. You may feel this pain in your lower back or lower abdomen. The pain should stop within a few minutes after you urinate. This may last for up to 1 week. °· A small amount of blood in your urine for several days. °Follow these instructions at home: °Medicines °· Take over-the-counter and prescription medicines only as told by your health care provider. °· If you were prescribed an antibiotic medicine, take it as told by your health care provider. Do not stop taking the antibiotic even if you start to feel better. °· Do not drive for 24 hours if you were given a sedative during your procedure. °· Ask your health care provider if the medicine prescribed to you requires you to avoid driving or using heavy machinery. °Activity °· Rest as told by your health care provider. °· Avoid sitting for a long time without moving. Get up to take short walks every 1-2 hours. This is important to improve blood flow and breathing. Ask for help if you feel weak or unsteady. °· Return to your normal activities as told by your health care provider. Ask your health care provider what activities are safe for you. °General instructions ° °· Watch for any blood in your urine. Call your health care provider if the amount of blood in your urine increases. °· If you have a catheter: °? Follow instructions from your health care provider about taking care of your catheter and collection bag. °? Do not take baths, swim, or use a hot tub until your health care provider approves. Ask your health care provider if you may take showers. You may only be allowed to  take sponge baths. °· Drink enough fluid to keep your urine pale yellow. °· Do not use any products that contain nicotine or tobacco, such as cigarettes, e-cigarettes, and chewing tobacco. These can delay healing after surgery. If you need help quitting, ask your health care provider. °· Keep all follow-up visits as told by your health care provider. This is important. °Contact a health care provider if: °· You have pain that gets worse or does not get better with medicine, especially pain when you urinate. °· You have difficulty urinating. °· You feel nauseous or you vomit repeatedly during a period of more than 2 days after the procedure. °Get help right away if: °· Your urine is dark red or has blood clots in it. °· You are leaking urine (have incontinence). °· The end of the stent comes out of your urethra. °· You cannot urinate. °· You have sudden, sharp, or severe pain in your abdomen or lower back. °· You have a fever. °· You have swelling or pain in your legs. °· You have difficulty breathing. °Summary °· After the procedure, it is common to have mild pain when you urinate that goes away within a few minutes after you urinate. This may last for up to 1 week. °· Watch for any blood in your urine. Call your health care provider if the amount of blood in your urine increases. °· Take over-the-counter and prescription medicines only as told by your health care provider. °· Drink   enough fluid to keep your urine pale yellow. This information is not intended to replace advice given to you by your health care provider. Make sure you discuss any questions you have with your health care provider. Document Released: 05/28/2013 Document Revised: 07/02/2018 Document Reviewed: 07/03/2018 Elsevier Patient Education  2020 [place].      Low-Sodium Nutrition Therapy Eating less sodium can help you if you have high blood pressure, heart failure, or kidney or liver disease. Your body needs a little sodium, but  too much sodium can cause your body to hold onto extra water. This extra water will raise your blood pressure and can cause damage to your heart, kidneys, or liver as they are forced to work harder. Sometimes you can see how the extra fluid affects you because your hands, legs, or belly swell.  You may also hold water around your heart and lungs, which makes it hard to breathe. Even if you take medication for blood pressure or a water pill (diuretic) to remove fluid, it is still important to have less salt in your diet. Check with your primary care provider before drinking alcohol since it may affect the amount of fluid in your body and how your heart, kidneys, or liver work.  Sodium in Food A low-sodium meal plan limits the sodium that you get from food and beverages to 1,500-2,000 milligrams (mg) per day.  Salt is the main source of sodium.  Read the nutrition label on the package to find out how much sodium is in one serving of a food. Select foods with 140 milligrams (mg) of sodium or less per serving. You may be able to eat one or two servings of foods with a little more than 140 milligrams (mg) of sodium if you are closely watching how much sodium you eat in a day. Check the serving size on the label.  The amount of sodium listed on the label shows the amount in one serving of the food.  So, if you eat more than one serving, you will get more sodium than the amount listed.  Cutting Back on Sodium Eat more fresh foods. Fresh fruits and vegetables are low in sodium, as well as frozen vegetables and fruits that have no added juices or sauces. Fresh meats are lower in sodium than processed meats, such as bacon, sausage, and hotdogs. Not all processed foods are unhealthy, but some processed foods may have too much sodium. Eat less salt at the table and when cooking. One of the ingredients in salt is sodium. One teaspoon of table salt has 2,300 milligrams of sodium. Leave the salt out of  recipes for pasta, casseroles, and soups. Be a Paramedic. Food packages that say Salt-free, sodium-free, very low sodium, and low sodium have less than 140 milligrams of sodium per serving. Beware of products identified as Unsalted, No Salt Added, Reduced Sodium, or Lower Sodium. These items may still be high in sodium. You should always check the nutrition label. Add flavors to your food without adding sodium. Try lemon juice, lime juice, or vinegar. Dry or fresh herbs add flavor. Buy a sodium-free seasoning blend or make your own at home. You can purchase salt-free or sodium-free condiments like barbeque sauce in stores and online.   Eating in Restaurants Choose foods carefully when you eat outside your home.  Restaurant foods can be very high in sodium.  Many restaurants provide nutrition facts on their menus or their websites.  If you cannot find that information, ask  your server.  Let your server know that you want your food to be cooked without salt and that you would like your salad dressing and sauces to be served on the side.  Foods Recommended Grains Bread, bagels, rolls without salted tops Homemade bread made with reduced-sodium baking powder Cold cereals, especially shredded wheat and puffed rice Oats, grits, or cream of wheat Pastas, quinoa, and rice Popcorn, pretzels or crackers without salt Corn tortillas Protein Foods Fresh meats and fish; [place] bacon (check the nutrition labels - make sure they are not packaged in a sodium solution) Canned or packed tuna (no more than 4 ounces at 1 serving) Beans and peas Soybeans) and tofu Eggs Nuts or nut butters without salt Dairy Milk or milk powder Plant milks, such as rice and soy Yogurt, including Greek yogurt Small amounts of natural cheese (blocks of cheese) or reduced-sodium cheese can be used in moderation (Swiss, ricotta, and fresh mozzarella cheese are lower in sodium than the others) Cream  cheese Low sodium cottage cheese Vegetables Fresh and frozen vegetables without added sauces or salt Homemade soups (without salt) Low-sodium, salt-free or sodium-free canned vegetables and soups Fruit Fresh and canned fruits Dried fruits, such as raisins, cranberries, and prunes Oils Tub or liquid margarine, regular or without salt Canola, corn, peanut, olive, safflower, or sunflower oils Condiments Fresh or dried herbs such as basil, bay leaf, dill, mustard (dry), nutmeg, paprika, parsley, rosemary, sage, or thyme. Low sodium ketchup Vinegar Lemon or lime juice Pepper, red pepper flakes, and cayenne. Hot sauce contains sodium, but if you use just a drop or two, it will not add up to much. Salt-free or sodium-free seasoning mixes and marinades Simple salad dressings: vinegar and oil  Foods Not Recommended Grains Breads or crackers topped with salt Cereals (hot/cold) with more than 300 mg sodium per serving Biscuits, cornbread, and other quick breads prepared with baking soda Pre-packaged bread crumbs Seasoned and packaged rice and pasta mixes Self-rising flours Protein Foods Cured meats: Bacon, ham, sausage, pepperoni and hot dogs Canned meats (chili, vienna sausage, or sardines) Smoked fish and meats Frozen meals that have more than 600 mg of sodium per serving Egg substitute (with added sodium) Dairy Buttermilk Processed cheese spreads Cottage cheese (1 cup may have over 500 mg of sodium; look for low-sodium.) American or feta cheese Shredded Cheese has more sodium than blocks of cheese String cheese Vegetables Canned vegetables (unless they are salt-free, sodium-free or low sodium) Frozen vegetables with seasoning and sauces Sauerkraut and pickled vegetables Canned or dried soups (unless they are salt-free, sodium-free, or low sodium) [place] fries and onion rings Fruit  Dried fruits preserved with additives that have sodium Oils  Salted butter or margarine,  all types of olives Condiments Salt, sea salt, kosher salt, onion salt, and garlic salt Seasoning mixes with salt Bouillon cubes Ketchup Barbeque sauce and Worcestershire sauce unless low sodium Soy sauce Salsa, pickles, olives, relish Salad dressings: ranch, blue cheese, [place], and [place].  Low Sodium Sample 1-Day Menu Breakfast 1 cup cooked oatmeal 1 slice whole wheat bread toast 1 tablespoon peanut butter without salt 1 banana 1 cup 1% milk Lunch Tacos made with: 2 corn tortillas  cup black beans, low sodium  cup roasted or grilled chicken (without skin)  avocado Squeeze of lime juice 1 cup salad greens 1 tablespoon low-sodium salad dressing  cup strawberries 1 orange Afternoon Snack 1/3 cup grapes 6 ounces yogurt Evening Meal 3 ounces herb-baked fish 1 baked potato 2 teaspoons olive  oil  cup cooked carrots 2 thick slices tomatoes on: 2 lettuce leaves 1 teaspoon olive oil 1 teaspoon balsamic vinegar 1 cup 1% milk Evening Snack 1 apple  cup almonds without salt  Low-Sodium Vegetarian (Lacto-Ovo) Sample 1-Day Menu Breakfast 1 cup cooked oatmeal 1 slice whole wheat toast 1 tablespoon peanut butter without salt 1 banana 1 cup 1% milk Lunch Tacos made with: 2 corn tortillas  cup black beans, low sodium  cup roasted or grilled chicken (without skin)  avocado Squeeze of lime juice 1 cup salad greens 1 tablespoon low-sodium salad dressing  cup strawberries 1 orange Evening Meal Stir fry made with:  cup tofu 1 cup brown rice  cup broccoli  cup green beans  cup peppers  tablespoon peanut oil 1 orange 1 cup 1% milk Evening Snack 4 strips celery 2 tablespoons hummus 1 hard-boiled egg    Sodium-Free Flavoring Tips When cooking, the following items may be used for flavoring instead of salt or seasonings that contain sodium. Remember: A little bit of spice goes a long way! Be careful not to overseason. Spice Blend Recipe (makes  about 1/2 cup) 5 teaspoons onion powder 2 teaspoons garlic powder 2 teaspoons paprika 2 teaspoon dry mustard 1 teaspoon crushed thyme leaves  teaspoon white pepper  teaspoon celery seed  Food Item Flavorings Beef  Basil, bay leaf, caraway, curry, dill, dry mustard, garlic, grape jelly, green pepper, mace, marjoram, mushrooms (fresh), nutmeg, onion or onion powder, parsley, pepper, rosemary, sage Chicken  Basil, cloves, cranberries, mace, mushrooms (fresh), nutmeg, oregano, paprika, parsley, pineapple, saffron, sage, savory, tarragon, thyme, tomato, turmeric Egg  Chervil, curry, dill, dry mustard, garlic or garlic powder, green pepper, jelly, mushrooms (fresh), nutmeg, onion powder, paprika, parsley, rosemary, tarragon, tomato Fish  Basil, bay leaf, chervil, curry, dill, dry mustard, green pepper, lemon juice, marjoram, mushrooms (fresh), paprika, pepper, tarragon, tomato, turmeric Lamb  Cloves, curry, dill, garlic or garlic powder, mace, mint, mint jelly, onion, oregano, parsley, pineapple, rosemary, tarragon, thyme Pork  Applesauce, basil, caraway, chives, cloves, garlic or garlic powder, onion or onion powder, rosemary, thyme Veal  Apricots, basil, bay leaf, currant jelly, curry, ginger, marjoram, mushrooms (fresh), oregano, paprika Vegetables  Basil, dill, garlic or garlic powder, ginger, lemon juice, mace, marjoram, nutmeg, onion or onion powder, tarragon, tomato, sugar or sugar substitute, salt-free salad dressing, vinegar Desserts  Allspice, anise, cinnamon, cloves, ginger, mace, nutmeg, vanilla extract, other extracts     Sodium (Salt) Content of Foods Eating more than the serving size for a moderate or low-sodium food will make it a high-sodium food. Foods made with high-sodium foods will also be high in sodium. Unless otherwise noted, all foods are cooked: meat is roasted, fish is cooked with dry heat, and vegetables are cooked from fresh and fruit is raw. This is a  guide. Actual values may vary depending on product and/or processing.  Canned and processed foods may have a higher sodium content. Values are rounded to the nearest 5-milligram (mg) increment and may be averaged with similar foods in the group.  High Sodium (more than 300 mg) Food Serving Milligrams (mg) Bacon 2 slices [id number] Bagel, 4?: egg 1 each 450 Bagel, 4?: plain, onion, or seeded 1 each 400 Barbecue sauce 2 Tbsp 350 Beans, baked, plain  cup 435 Beans, garbanzo  cup 360 Beans, kidney, canned  cup 440 Beans, lima, canned  cup 405 Beans, white, canned  cup 445 Beef, dried 1 oz. 790 Biscuit, 2? 1 each 350 Catsup 2 Tbsp 335  Cheese, American 1 oz 400 Cheese, cottage  cup 460 Cheese, feta 1 oz 315 Corn, creamed, canned  cup 365 Croissant 2 oz 425 Fish, salmon, canned 3 oz 470 Fish, salmon, smoked 3 oz 670 Fish, sardines, canned 3 oz 430 Frankfurter, beef or pork 1 each 510 Ham 3 oz 1,125 Lobster 3 oz 325 Miso  cup 1,280 Mushrooms, canned  cup 330 Pickle, dill 1 large 570 Potatoes, au gratin or scalloped  cup 500 Pretzels 1 oz 400 Pudding, instant, chocolate, prepared with milk  cup 420 Salad dressing, [place], commercial 2 Tbsp 485 Salami, dry or hard 1 oz 600 Salt, table 1 tsp 2,325 Sauerkraut, canned  cup 780 Soup, canned 1 cup 700-1,000 Soy sauce 1 Tbsp 900 Spinach, canned, drained  cup 345 Teriyaki sauce 1 Tbsp 690 Tomato or vegetable juice, canned  cup 325 Tomato sauce, canned  cup 640 Tomato sauce, spaghetti or marinara  cup 510 Vegetable or soy patty 1 each 380  Moderate Sodium (140-300 mg) Food Serving Milligrams (mg) Asparagus, canned 4 spears 205 Beans, green or yellow, canned  cup 175 Beets, canned  cup 160 Bologna, pork and beef 1 oz 210 Bread, pita, 4? 1 each 150 Bread, pumpernickel or rye 1 slice 123456 Bread, white 1 slice [id number] Carrots, canned  cup 175 Cereal, raisin bran  cup 175 Cheese: [place], mozzarella, cheddar 1 oz  175 Cheese, Parmesan 2 Tbsp 150 Cheese, provolone, part-skim 1 oz 250 Cheese, ricotta  cup 155 Corn, canned  cup 285 Crab, canned 3 oz 240 English muffin 1 each 250 French fries 10 fries 200 Greens, beet  cup 175 Milk, buttermilk 1 cup 260 Milk, chocolate 1 cup 165 Milkshake 8 oz 240 Muffin 2 oz 250 Nuts, mixed, salted 1 oz 190 Olives, ripe, canned 5 large 190 Pancake or waffle, 4? 1 each 240 Peanuts, salted 1 oz 230 Peas, green, canned  cup 215 Potato chips 1 oz 190 Potatoes, mashed, prepared from dry mix  cup 170 Pudding, ready-to-eat  cup 160 Pudding, vanilla, from mix  cup 225 Roll, hot dog or hamburger 1 each 205 Salad dressing 2 Tbsp 200-300 Salsa 2 Tbsp 195 Sausage, pork 1 oz 200 Tomatoes, canned  cup 170 Tomatoes, stewed, canned  cup 280 Tortilla, flour, 6? 1 each 205 Tuna, canned in water 3 oz 290 Yogurt, plain or fruited 8 oz 100-175  Low Sodium (less than 140 mg) Food Serving Milligrams (mg) Bread, Italian 1 slice [id number] Bread, wheat 1 slice [id number] Butter, salted 1 Tbsp 90 Cereal, breakfast: corn, bran, or wheat  cup 100-150 Cheese, Swiss 1 oz 55 Egg substitute, liquid  cup 120 Egg, whole 1 large 70 Fish: pollock, swordfish, perch, cod, halibut, roughy, salmon 3 oz 60-100 Frozen yogurt  cup 65 Gelatin, prepared from mix  cup 100 Ice cream  cup 55 Margarine, regular 1 Tbsp 135 Milk, all types 1 cup 100 Milk, evaporated, canned  cup 135 Mustard 1 tsp 55 Peanut butter 1 Tbsp 75 Peas, green, frozen  cup 60 Seeds, sunflower 1 oz 115 Soy milk 1 cup 125 Spinach  cup 65 Spinach, frozen  cup 90 Sweet potato, baked in skin 1 medium 40 [place], light or dark meat 3 oz 60 Yogurt, plain or fruited 8 oz 100-175  Very Low Sodium (less than 35 mg) Food Serving Milligrams (mg) Apricots, canned  cup 5 Beef, ground 1 oz. 20 Beer, regular 12 oz 15 Broccoli  cup 30 Broccoli, raw  cup 15 Brussels sprouts  cup 15  Cabbage, raw or cooked  cup  5 Carbonated beverages 12 oz 20-40 Cauliflower  cup 10 Cauliflower, raw  cup 15 Dried beans and peas  cup 5-20 Greens: beet, collard, mustard  cup 10-20 Honeydew  cup 30 Lettuce, leaf 1 cup 15 Noodles  cup 10 Oatmeal  cup 5 Peaches, canned  cup 5 Pears, canned  cup 5 Pork 1 oz 25 Potato, baked with skin 1 medium 20 Rice, brown or wild  cup 5 Sherbet  cup 35 Soybeans  cup 15 Spinach, raw 1 cup 25 Tofu, firm  cup 10 Wine, table, all types 5 oz 10 Sodium Free (less than 5 mg) Food Serving Avocado 1 oz Beans: navy, black, pinto  cup Nuts: almonds, pecans, or walnuts, unsalted 1 oz Oil, all types 1 Tbsp Popcorn, air popped 1 cup Raisins, seedless  cup Rice, white  cup Tomato, raw 1 medium Fruit and juices not previously listed 1 piece or  cup Vegetables not previously listed  cup

## 2019-07-02 NOTE — Progress Notes (Signed)
Patient discharged home.  IVs removed - WNL.  Reviewed AVS and medications.  Discussed Heart failure management at home in detail including daily weights and low sodium diet and when to call MD - handouts given.  Instructed to follow up with PCP, cardio, and urology.  Patient verbalizes understanding,  No questions at this time.  Patient assisted off unit in NAD.

## 2019-07-03 LAB — CULTURE, BLOOD (ROUTINE X 2)
Culture: NO GROWTH
Culture: NO GROWTH
Special Requests: ADEQUATE
Special Requests: ADEQUATE

## 2019-07-03 NOTE — Anesthesia Postprocedure Evaluation (Signed)
Anesthesia Post Note  Patient: [name]  Procedure(s) Performed: CYSTOSCOPY WITH LEFT RETROGRADE PYELOGRAM, URETEROSCOPY, BIOPSY AND LEFT STENT PLACEMENT (Left )     Patient location during evaluation: PACU Anesthesia Type: General Level of consciousness: awake and alert Pain management: pain level controlled Vital Signs Assessment: post-procedure vital signs reviewed and stable Respiratory status: spontaneous breathing, nonlabored ventilation, respiratory function stable and patient connected to nasal cannula oxygen Cardiovascular status: blood pressure returned to baseline and stable Postop Assessment: no apparent nausea or vomiting Anesthetic complications: no    Last Vitals:  Vitals:   07/02/19 0425 07/02/19 1430  BP: (!) 137/58 (!) 103/55  Pulse: 78 70  Resp: 18   Temp: 36.9 C   SpO2: 93%     Last Pain:  Vitals:   07/02/19 1415  TempSrc:   PainSc: 1                  Ashby Moskal COKER

## 2019-07-07 ENCOUNTER — Other Ambulatory Visit: Payer: Medicare PPO

## 2019-07-11 DIAGNOSIS — C669 Malignant neoplasm of unspecified ureter: Secondary | ICD-10-CM | POA: Diagnosis not present

## 2019-07-11 DIAGNOSIS — C678 Malignant neoplasm of overlapping sites of bladder: Secondary | ICD-10-CM | POA: Diagnosis not present

## 2019-07-14 ENCOUNTER — Other Ambulatory Visit: Payer: Medicare PPO | Admitting: *Deleted

## 2019-07-14 ENCOUNTER — Other Ambulatory Visit: Payer: Self-pay

## 2019-07-14 DIAGNOSIS — Z5181 Encounter for therapeutic drug level monitoring: Secondary | ICD-10-CM

## 2019-07-14 LAB — BASIC METABOLIC PANEL
BUN/Creatinine Ratio: 13 (ref 12–28)
BUN: 21 mg/dL (ref 8–27)
CO2: 24 mmol/L (ref 20–29)
Calcium: 9.9 mg/dL (ref 8.7–10.3)
Chloride: 97 mmol/L (ref 96–106)
Creatinine, Ser: 1.67 mg/dL — ABNORMAL HIGH (ref 0.57–1.00)
GFR calc Af Amer: 35 mL/min/{1.73_m2} — ABNORMAL LOW (ref >59)
GFR calc non Af Amer: 31 mL/min/{1.73_m2} — ABNORMAL LOW (ref >59)
Glucose: 113 mg/dL — ABNORMAL HIGH (ref 65–99)
Potassium: 4.3 mmol/L (ref 3.5–5.2)
Sodium: 137 mmol/L (ref 134–144)

## 2019-07-15 ENCOUNTER — Telehealth: Payer: Self-pay

## 2019-07-15 NOTE — Telephone Encounter (Signed)
-----   Message from Consuelo Pandy, [place] sent at 07/15/2019  4:51 PM EDT ----- Labs stable w/ medication (spironolactone). Continue to take as directed.

## 2019-07-15 NOTE — Telephone Encounter (Signed)
Notes recorded by Frederik Schmidt, RN on 07/15/2019 at 4:54 PM EDT  The patient has been notified of the result and verbalized understanding. All questions (if any) were answered.  Frederik Schmidt, RN 07/15/2019 4:54 PM

## 2019-07-16 DIAGNOSIS — N183 Chronic kidney disease, stage 3 unspecified: Secondary | ICD-10-CM | POA: Diagnosis not present

## 2019-07-16 DIAGNOSIS — C662 Malignant neoplasm of left ureter: Secondary | ICD-10-CM | POA: Diagnosis not present

## 2019-07-16 DIAGNOSIS — I10 Essential (primary) hypertension: Secondary | ICD-10-CM | POA: Diagnosis not present

## 2019-07-17 ENCOUNTER — Telehealth: Payer: Self-pay | Admitting: Cardiovascular Disease

## 2019-07-17 NOTE — Telephone Encounter (Signed)
° °  [facility] Pre-operative Risk Assessment    Request for surgical clearance:  1. What type of surgery is being performed? 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 When is this surgery scheduled? 07-24-19   3. What type of clearance is required (medical clearance vs. Pharmacy clearance to hold med vs. Both)? Medical  4. Are there any medications that need to be held prior to surgery and how long? No   5. Practice name and name of physician performing surgery?  [name]   6. What is your office phone number [phone]    7.   What is your office fax number [phone]  8.   Anesthesia type (None, local, MAC, general) ?  General   Glyn Ade 07/17/2019, 9:17 AM  _________________________________________________________________   (provider comments below)

## 2019-07-21 NOTE — Telephone Encounter (Signed)
Please inform the surgeon to delay the surgery until after 10/21. Patient has hospital followup on 10/21 with [facility] PA-C. Her cardiac clearance will be addressed at that time after evaluating her volume status.

## 2019-07-21 NOTE — Telephone Encounter (Signed)
Left a voice message for the surgery scheduler to give me a call back.

## 2019-07-22 NOTE — Telephone Encounter (Signed)
[name] from the requesting office returned my call and informed me that the surgery is scheduled for 11/09 at [facility] pending cardiac clearance. Will route this back to the pool so that it can be reviewed and closed out of the pool.

## 2019-07-29 NOTE — Progress Notes (Signed)
Cardiology Office Note    Date:  07/30/2019   ID:  Heather, [name] 1946/03/22, MRN [id number]  PCP:  Heather Amel, MD  Cardiologist:  Dr. Acie Fredrickson  Chief Complaint: Hospital follow up and surgical clearance for Ureteroscopy and Laser Ablation of Ureteral Tumor   History of Present Illness:   [name] is a 73 y.o. female with hx of hypertension, hyperlipidemia, renal cell carcinoma status post right nephrectomy (follows with [name]) and IBSpresented for hospital follow up.   Sent to ED by her PCP given worsening renal function with creatinine noted at2.3.(baseline 1.1). Admitted for acute hypoxic respiratory failure. Subsequent 2D echo showed mildly reduced LV systolic function w/ EF at 40-45%, diffuse hypokinesis and moderate mitral regurgitation, for which cardiology was consulted.  She diuresed well. She had a low risk Myoview study.  There was no definite area of ischemia.  There was some concern that balanced ischemia could not be ruled out but she overall is doing much better on medical therapy.  If she has worsening symptoms we can always consider heart catheterization in the future. Renal ultrasound done concerning for hydronephrosis of solitary left kidney. CT stone protocol was done that showed mild left hydroureteronephrosis to the level of the pelvis with appearance of soft tissue density within the ureter consistent with hemorrhage versus soft tissue mass such as a urothelial malignancy. Patient subsequently underwent cystoscopy with left retrograde pyelogram and interpretation, left ureteroscopy with biopsy of left ureteral neoplasm insertion of left double-J stent.   Here today for follow up and surgical clearance.  Patient intermittent fatigue.  Her blood pressure stays 110-120s/60-70s.  Intermittently in [id number] systolically.  She denies chest pain, shortness of breath, orthopnea, PND, syncope, dizziness, lower extremity edema or melena.  Compliant with  her medication.   Past Medical History:  Diagnosis Date  . Anxiety   . Cancer ([place])   . Depression   . History of kidney cancer   . Hyperlipidemia   . Hypertension     Past Surgical History:  Procedure Laterality Date  . bladder-partial removal    . CYSTOSCOPY WITH RETROGRADE PYELOGRAM, URETEROSCOPY AND STENT PLACEMENT Left 06/27/2019   Procedure: CYSTOSCOPY WITH LEFT RETROGRADE PYELOGRAM, URETEROSCOPY, BIOPSY AND LEFT STENT PLACEMENT;  Surgeon: Irine Seal, MD;  Location: [facility];  Service: Urology;  Laterality: Left;  . kidney removed      Current Medications: Prior to Admission medications   Medication Sig Start Date End Date Taking? Authorizing Provider  amLODipine (NORVASC) 5 MG tablet Take 1 tablet (5 mg total) by mouth daily. 07/03/19 08/02/19  Shahmehdi, Valeria Batman, MD  calcium carbonate (TUMS - DOSED IN MG ELEMENTAL CALCIUM) 500 MG chewable tablet Chew 1 tablet (200 mg of elemental calcium total) by mouth daily. 07/03/19 08/02/19  Deatra James, MD  carvedilol (COREG) 6.25 MG tablet Take 1 tablet (6.25 mg total) by mouth 2 (two) times daily with a meal. 07/02/19 08/01/19  Shahmehdi, Valeria Batman, MD  cholecalciferol (VITAMIN D) 1000 UNITS tablet Take 1,000 Units by mouth daily.    [provider]  hydrALAZINE (APRESOLINE) 50 MG tablet Take 1 tablet (50 mg total) by mouth every 8 (eight) hours. 07/02/19 08/01/19  Deatra James, MD  HYDROcodone-acetaminophen (NORCO/VICODIN) 5-325 MG tablet Take 1 tablet by mouth every 6 (six) hours as needed for severe pain. Patient not taking: Reported on 06/25/2019 01/04/17   Mesner, Corene Cornea, MD  isosorbide mononitrate (IMDUR) 30 MG 24 hr tablet Take 1 tablet (30  mg total) by mouth daily. 07/03/19 08/02/19  Shahmehdi, Valeria Batman, MD  sertraline (ZOLOFT) 50 MG tablet Take 50 mg by mouth daily.    [provider]  simvastatin (ZOCOR) 20 MG tablet Take 20 mg by mouth daily.    [provider]  spironolactone (ALDACTONE) 25 MG  tablet Take 1 tablet (25 mg total) by mouth daily. 07/02/19 08/01/19  Shahmehdi, Valeria Batman, MD  Tavaborole (KERYDIN) 5 % SOLN Apply 1 drop each affected nail once daily for 12 months duration Patient not taking: Reported on 06/25/2019 04/15/14   [name], DPM  traZODone (DESYREL) 50 MG tablet Take 100 mg by mouth at bedtime.    [provider]    Allergies:   Erythromycin and Other   Social History   Socioeconomic History  . Marital status: Married    Spouse name: Not on file  . Number of children: Not on file  . Years of education: Not on file  . Highest education level: Not on file  Occupational History  . Not on file  Social Needs  . Financial resource strain: Not on file  . Food insecurity    Worry: Not on file    Inability: Not on file  . Transportation needs    Medical: Not on file    Non-medical: Not on file  Tobacco Use  . Smoking status: Former Smoker    Packs/day: 0.50    Years: 38.00    Pack years: 19.00    Types: Cigarettes    Quit date: 11/26/2003    Years since quitting: 15.6  . Smokeless tobacco: Never Used  Substance and Sexual Activity  . Alcohol use: No    Alcohol/week: 0.0 standard drinks  . Drug use: No  . Sexual activity: Not on file  Lifestyle  . Physical activity    Days per week: Not on file    Minutes per session: Not on file  . Stress: Not on file  Relationships  . Social Herbalist on phone: Not on file    Gets together: Not on file    Attends religious service: Not on file    Active member of club or organization: Not on file    Attends meetings of clubs or organizations: Not on file    Relationship status: Not on file  Other Topics Concern  . Not on file  Social History Narrative  . Not on file     Family History:  The patient's family history includes Diabetes Mellitus II in her sister; Hypertension in her brother, mother, and sister.   ROS:   Please see the history of present illness.    ROS All other  systems reviewed and are negative.   PHYSICAL EXAM:   VS:  BP (!) 112/50   Pulse 62   Ht 5\' 3"  (1.6 m)   Wt 172 lb 3.2 oz (78.1 kg)   BMI 30.50 kg/m    GEN: Well nourished, well developed, in no acute distress  HEENT: normal  Neck: no JVD, carotid bruits, or masses Cardiac: RRR; no murmurs, rubs, or gallops,no edema  Respiratory:  clear to auscultation bilaterally, normal work of breathing GI: soft, nontender, nondistended, + BS MS: no deformity or atrophy  Skin: warm and dry, no rash Neuro:  Alert and Oriented x 3, Strength and sensation are intact Psych: euthymic mood, full affect  Wt Readings from Last 3 Encounters:  07/30/19 172 lb 3.2 oz (78.1 kg)  07/02/19 180 lb  5.4 oz (81.8 kg)  01/04/17 173 lb (78.5 kg)      Studies/Labs Reviewed:   EKG:  EKG is ordered today.  The ekg ordered today demonstrates sinus rhythm at rate of 62 bpm  Recent Labs: 06/25/2019: B Natriuretic Peptide 159.7 06/29/2019: ALT 42 07/01/2019: Magnesium 1.8 07/02/2019: Hemoglobin 10.5; Platelets 348 07/14/2019: BUN 21; Creatinine, Ser 1.67; Potassium 4.3; Sodium 137   Lipid Panel    Component Value Date/Time   CHOL 150 07/01/2019 0443   TRIG 117 07/01/2019 0443   HDL 47 07/01/2019 0443   CHOLHDL 3.2 07/01/2019 0443   VLDL 23 07/01/2019 0443   LDLCALC 80 07/01/2019 0443    Additional studies/ records that were reviewed today include:   Echocardiogram: 06/28/2019 1. Left ventricular ejection fraction, by visual estimation, is 40-45% with diffuse hypokinesis. The left ventricle has normal function. Normal left ventricular size. There is moderately increased left ventricular hypertrophy.  2. Elevated left ventricular end-diastolic pressure.  3. Left ventricular diastolic Doppler parameters are consistent with impaired relaxation pattern of LV diastolic filling.  4. Global right ventricle has normal systolic function.The right ventricular size is normal. No increase in right ventricular wall  thickness.  5. Left atrial size was mildly dilated.  6. Right atrial size was normal.  7. Trivial pericardial effusion is present.  8. The mitral valve is normal in structure. Moderate mitral valve regurgitation. No evidence of mitral stenosis.  9. The tricuspid valve is normal in structure. Tricuspid valve regurgitation is mild. 10. The aortic valve is normal in structure. Aortic valve regurgitation was not visualized by color flow Doppler. Mild aortic valve sclerosis without stenosis. 11. The pulmonic valve was normal in structure. Pulmonic valve regurgitation is mild by color flow Doppler. 12. The inferior vena cava is normal in size with greater than 50% respiratory variability, suggesting right atrial pressure of 3 mmHg. 13. Normal pulmonary artery systolic pressure.  Stress test 06/30/2019  There was no ST segment deviation noted during stress.  No T wave inversion was noted during stress.  Defect 1: There is a medium defect of mild severity present in the mid anteroseptal, mid inferolateral, apical anterior, apical septal and apical lateral location.  This is a low risk study.  Nuclear stress EF: 63%.  The left ventricular ejection fraction is normal (55-65%).  No prior study for comparison.   There is a mild to moderate defect extending from mid anteroseptum to inferolateral wall. It improves with stress, which is not consistent with ischemia. Normal wall motion in this area. Not consistent with single coronary distrubution. However, TID is 1.21, which means balanced ischemia in multiple territories. cannot be excluded Cardiac Catheterization:     ASSESSMENT & PLAN:    1. Chronic systolic and diastolic CHF - Echo showed EF at 40-45%, diffuse hypokinesis and moderate mitral regurgitation. - She had a low risk Myoview study.  There was no definite area of ischemia.  There was some concern that balanced ischemia could not be ruled out but she overall is doing much better on  medical therapy.  If she has worsening symptoms, plan to consider heart catheterization in the future. -No symptoms concerning for angina or CHF exacerbation.  Continue current medication.  Weight has been stable at home.  2. HLD -Continue statin.  3. Surgical clearance -She is doing well without any exacerbation of symptoms. Given past medical history and time since last visit, based on ACC/AHA guidelines, [name] would be at acceptable risk for the planned  procedure without further cardiovascular testing.     Medication Adjustments/Labs and Tests Ordered: Current medicines are reviewed at length with the patient today.  Concerns regarding medicines are outlined above.  Medication changes, Labs and Tests ordered today are listed in the Patient Instructions below. Patient Instructions  Medication Instructions:  Your physician recommends that you continue on your current medications as directed. Please refer to the Current Medication list given to you today. But, if your blood pressure is less than a 100 on the top, only take 1/2 of the Amlodipine  *If you need a refill on your cardiac medications before your next appointment, please call your pharmacy*  Lab Work: None ordered  If you have labs (blood work) drawn today and your tests are completely normal, you will receive your results only by: Marland Kitchen MyChart Message (if you have MyChart) OR . A paper copy in the mail If you have any lab test that is abnormal or we need to change your treatment, we will call you to review the results.  Testing/Procedures: None ordered  Follow-Up: At [facility], you and your health needs are our priority.  As part of our continuing mission to provide you with exceptional heart care, we have created designated Provider Care Teams.  These Care Teams include your primary Cardiologist (physician) and Advanced Practice Providers (APPs -  Physician Assistants and Nurse Practitioners) who all work  together to provide you with the care you need, when you need it.  Your next appointment:   3 months  The format for your next appointment:   Virtual Visit   Provider:   Mertie Moores, MD  Other Instructions      Signed, Leanor Kail, PA  07/30/2019 11:44 AM    [facility] [address], [place], [place]  [address] Phone: [phone]; Fax: [phone]

## 2019-07-30 ENCOUNTER — Ambulatory Visit (INDEPENDENT_AMBULATORY_CARE_PROVIDER_SITE_OTHER): Payer: Medicare PPO | Admitting: Physician Assistant

## 2019-07-30 ENCOUNTER — Encounter: Payer: Self-pay | Admitting: Physician Assistant

## 2019-07-30 ENCOUNTER — Other Ambulatory Visit: Payer: Self-pay

## 2019-07-30 VITALS — BP 112/50 | HR 62 | Ht 63.0 in | Wt 172.2 lb

## 2019-07-30 DIAGNOSIS — I10 Essential (primary) hypertension: Secondary | ICD-10-CM

## 2019-07-30 DIAGNOSIS — I5021 Acute systolic (congestive) heart failure: Secondary | ICD-10-CM | POA: Diagnosis not present

## 2019-07-30 DIAGNOSIS — I5042 Chronic combined systolic (congestive) and diastolic (congestive) heart failure: Secondary | ICD-10-CM

## 2019-07-30 MED ORDER — ISOSORBIDE MONONITRATE ER 30 MG PO TB24: 30.0000 mg | ORAL_TABLET | Freq: Every day | ORAL | 3 refills | Status: DC

## 2019-07-30 MED ORDER — SPIRONOLACTONE 25 MG PO TABS: 25.0000 mg | ORAL_TABLET | Freq: Every day | ORAL | 3 refills | Status: DC

## 2019-07-30 MED ORDER — AMLODIPINE BESYLATE 5 MG PO TABS: 5.0000 mg | ORAL_TABLET | Freq: Every day | ORAL | 3 refills | Status: DC

## 2019-07-30 MED ORDER — HYDRALAZINE HCL 50 MG PO TABS: 50.0000 mg | ORAL_TABLET | Freq: Three times a day (TID) | ORAL | 3 refills | Status: DC

## 2019-07-30 NOTE — Patient Instructions (Addendum)
Medication Instructions:  Your physician recommends that you continue on your current medications as directed. Please refer to the Current Medication list given to you today. But, if your blood pressure is less than a 100 on the top, only take 1/2 of the Amlodipine  *If you need a refill on your cardiac medications before your next appointment, please call your pharmacy*  Lab Work: None ordered  If you have labs (blood work) drawn today and your tests are completely normal, you will receive your results only by: [name] MyChart Message (if you have MyChart) OR . A paper copy in the mail If you have any lab test that is abnormal or we need to change your treatment, we will call you to review the results.  Testing/Procedures: None ordered  Follow-Up: At [facility], you and your health needs are our priority.  As part of our continuing mission to provide you with exceptional heart care, we have created designated Provider Care Teams.  These Care Teams include your primary Cardiologist (physician) and Advanced Practice Providers (APPs -  Physician Assistants and Nurse Practitioners) who all work together to provide you with the care you need, when you need it.  Your next appointment:   3 months  11/03/2019 AT 9:00  The format for your next appointment:   Virtual Visit   Provider:   Mertie Moores, MD  Other Instructions     Virtual Visit Pre-Appointment Phone Call  "(Name), I am calling you today to discuss your upcoming appointment. We are currently trying to limit exposure to the virus that causes COVID-19 by seeing patients at home rather than in the office."  1. "What is the BEST phone number to call the day of the visit?" - include this in appointment notes  2. "Do you have or have access to (through a family member/friend) a smartphone with video capability that we can use for your visit?" a. If yes - list this number in appt notes as "cell" (if different from BEST phone #) and  list the appointment type as a VIDEO visit in appointment notes b. If no - list the appointment type as a PHONE visit in appointment notes  3. Confirm consent - "In the setting of the current Covid19 crisis, you are scheduled for a (phone or video) visit with your provider on (date) at (time).  Just as we do with many in-office visits, in order for you to participate in this visit, we must obtain consent.  If you'd like, I can send this to your mychart (if signed up) or email for you to review.  Otherwise, I can obtain your verbal consent now.  All virtual visits are billed to your insurance company just like a normal visit would be.  By agreeing to a virtual visit, we'd like you to understand that the technology does not allow for your provider to perform an examination, and thus may limit your provider's ability to fully assess your condition. If your provider identifies any concerns that need to be evaluated in person, we will make arrangements to do so.  Finally, though the technology is pretty good, we cannot assure that it will always work on either your or our end, and in the setting of a video visit, we may have to convert it to a phone-only visit.  In either situation, we cannot ensure that we have a secure connection.  Are you willing to proceed?" STAFF: Did the patient verbally acknowledge consent to telehealth visit? Document YES/NO here:  YES  4. Advise patient to be prepared - "Two hours prior to your appointment, go ahead and check your blood pressure, pulse, oxygen saturation, and your weight (if you have the equipment to check those) and write them all down. When your visit starts, your provider will ask you for this information. If you have an Apple Watch or Kardia device, please plan to have heart rate information ready on the day of your appointment. Please have a pen and paper handy nearby the day of the visit as well."  5. Give patient instructions for MyChart download to smartphone OR  [email or web address] as below if video visit (depending on what platform provider is using)  6. Inform patient they will receive a phone call 15 minutes prior to their appointment time (may be from unknown caller ID) so they should be prepared to answer    IF USING DOXIMITY or DOXY.ME - The patient will receive a link just prior to their visit by text.     FULL LENGTH CONSENT FOR TELE-HEALTH VISIT   I hereby voluntarily request, consent and authorize [facility] and its employed or contracted physicians, physician assistants, nurse practitioners or other licensed health care professionals (the Practitioner), to provide me with telemedicine health care services (the "Services") as deemed necessary by the treating Practitioner. I acknowledge and consent to receive the Services by the Practitioner via telemedicine. I understand that the telemedicine visit will involve communicating with the Practitioner through live audiovisual communication technology and the disclosure of certain medical information by electronic transmission. I acknowledge that I have been given the opportunity to request an in-person assessment or other available alternative prior to the telemedicine visit and am voluntarily participating in the telemedicine visit.  I understand that I have the right to withhold or withdraw my consent to the use of telemedicine in the course of my care at any time, without affecting my right to future care or treatment, and that the Practitioner or I may terminate the telemedicine visit at any time. I understand that I have the right to inspect all information obtained and/or recorded in the course of the telemedicine visit and may receive copies of available information for a reasonable fee.  I understand that some of the potential risks of receiving the Services via telemedicine include:  [name] Delay or interruption in medical evaluation due to technological equipment failure or  disruption; . Information transmitted may not be sufficient (e.g. poor resolution of images) to allow for appropriate medical decision making by the Practitioner; and/or  . In rare instances, security protocols could fail, causing a breach of personal health information.  Furthermore, I acknowledge that it is my responsibility to provide information about my medical history, conditions and care that is complete and accurate to the best of my ability. I acknowledge that Practitioner's advice, recommendations, and/or decision may be based on factors not within their control, such as incomplete or inaccurate data provided by me or distortions of diagnostic images or specimens that may result from electronic transmissions. I understand that the practice of medicine is not an exact science and that Practitioner makes no warranties or guarantees regarding treatment outcomes. I acknowledge that I will receive a copy of this consent concurrently upon execution via email to the email address I last provided but may also request a printed copy by calling the office of [facility].    I understand that my insurance will be billed for this visit.   I have read or had this  consent read to me. . I understand the contents of this consent, which adequately explains the benefits and risks of the Services being provided via telemedicine.  . I have been provided ample opportunity to ask questions regarding this consent and the Services and have had my questions answered to my satisfaction. . I give my informed consent for the services to be provided through the use of telemedicine in my medical care  By participating in this telemedicine visit I agree to the above.

## 2019-08-11 NOTE — Progress Notes (Signed)
Please place surgery orders. Pt scheduled for her PAT appointment tomorrow.

## 2019-08-11 NOTE — Patient Instructions (Addendum)
DUE TO COVID-19 ONLY ONE VISITOR IS ALLOWED TO COME WITH YOU AND STAY IN THE WAITING ROOM ONLY DURING PRE OP AND PROCEDURE DAY OF SURGERY. THE 1 VISITOR MAY VISIT WITH YOU AFTER SURGERY IN YOUR PRIVATE ROOM DURING VISITING HOURS ONLY!  YOU NEED TO HAVE A COVID 19 TEST O 08-14-19 @ 3:00 PM. THIS TEST MUST BE DONE BEFORE SURGERY, COME  [address], [place] , [address].  ([place]) ONCE YOUR COVID TEST IS COMPLETED, PLEASE BEGIN THE QUARANTINE INSTRUCTIONS AS OUTLINED IN YOUR HANDOUT.                TERIANN BOLLE  08/11/2019   Your procedure is scheduled on: 08-18-19    Report to [facility] Main  Entrance    Report to Short Stay at  5:30 AM     Call this number if you have problems the morning of surgery [phone]    Remember: Do not eat food or drink liquids :After Midnight.     Take these medicines the morning of surgery with A SIP OF WATER: Amlodipine (Norvasc), Carvedilol (Coreg), Hydralazine (Apresoline), Isosorbide Monnitrate (Imdur), Sertraline (Zoloft), and Simvastatin (Zocor)  BRUSH YOUR TEETH MORNING OF SURGERY AND RINSE YOUR MOUTH OUT, NO CHEWING GUM CANDY OR MINTS.                               You may not have any metal on your body including hair pins and              piercings     Do not wear jewelry, make-up, lotions, powders or perfumes, deodorant              Do not wear nail polish on your fingernails.  Do not shave  48 hours prior to surgery.             Do not bring valuables to the hospital. [place].  Contacts, dentures or bridgework may not be worn into surgery.      Patients discharged the day of surgery will not be allowed to drive home. IF YOU ARE HAVING SURGERY AND GOING HOME THE SAME DAY, YOU MUST HAVE AN ADULT TO DRIVE YOU HOME AND BE WITH YOU FOR 24 HOURS. YOU MAY GO HOME BY TAXI OR UBER OR ORTHERWISE, BUT AN ADULT MUST ACCOMPANY YOU HOME AND STAY WITH YOU FOR  24 HOURS.  Name and phone number of your driver :Aldina Krolak [phone]                Please read over the following fact sheets you were given: _____________________________________________________________________             [facility] - Preparing for Surgery Before surgery, you can play an important role.  Because skin is not sterile, your skin needs to be as free of germs as possible.  You can reduce the number of germs on your skin by washing with CHG (chlorahexidine gluconate) soap before surgery.  CHG is an antiseptic cleaner which kills germs and bonds with the skin to continue killing germs even after washing. Please DO NOT use if you have an allergy to CHG or antibacterial soaps.  If your skin becomes reddened/irritated stop using the CHG and inform your nurse when you arrive at Short Stay.  Do not shave (including legs and underarms) for at least 48 hours prior to the first CHG shower.  You may shave your face/neck. Please follow these instructions carefully:  1.  Shower with CHG Soap the night before surgery and the  morning of Surgery.  2.  If you choose to wash your hair, wash your hair first as usual with your  normal  shampoo.  3.  After you shampoo, rinse your hair and body thoroughly to remove the  shampoo.                           4.  Use CHG as you would any other liquid soap.  You can apply chg directly  to the skin and wash                       Gently with a scrungie or clean washcloth.  5.  Apply the CHG Soap to your body ONLY FROM THE NECK DOWN.   Do not use on face/ open                           Wound or open sores. Avoid contact with eyes, ears mouth and genitals (private parts).                       Wash face,  Genitals (private parts) with your normal soap.             6.  Wash thoroughly, paying special attention to the area where your surgery  will be performed.  7.  Thoroughly rinse your body with warm water from the neck down.  8.  DO NOT shower/wash  with your normal soap after using and rinsing off  the CHG Soap.                9.  Pat yourself dry with a clean towel.            10.  Wear clean pajamas.            11.  Place clean sheets on your bed the night of your first shower and do not  sleep with pets. Day of Surgery : Do not apply any lotions/deodorants the morning of surgery.  Please wear clean clothes to the hospital/surgery center.  FAILURE TO FOLLOW THESE INSTRUCTIONS MAY RESULT IN THE CANCELLATION OF YOUR SURGERY PATIENT SIGNATURE_________________________________  NURSE SIGNATURE__________________________________  ________________________________________________________________________

## 2019-08-12 ENCOUNTER — Encounter (HOSPITAL_COMMUNITY)
Admission: RE | Admit: 2019-08-12 | Discharge: 2019-08-12 | Disposition: A | Discharge status: Home / Self Care | Payer: Medicare PPO | Source: Ambulatory Visit | Attending: Urology | Admitting: Urology

## 2019-08-12 ENCOUNTER — Encounter (HOSPITAL_COMMUNITY): Payer: Self-pay

## 2019-08-12 ENCOUNTER — Other Ambulatory Visit: Payer: Self-pay

## 2019-08-12 DIAGNOSIS — E785 Hyperlipidemia, unspecified: Secondary | ICD-10-CM | POA: Insufficient documentation

## 2019-08-12 DIAGNOSIS — I5040 Unspecified combined systolic (congestive) and diastolic (congestive) heart failure: Secondary | ICD-10-CM | POA: Insufficient documentation

## 2019-08-12 DIAGNOSIS — F329 Major depressive disorder, single episode, unspecified: Secondary | ICD-10-CM | POA: Diagnosis not present

## 2019-08-12 DIAGNOSIS — C662 Malignant neoplasm of left ureter: Secondary | ICD-10-CM | POA: Diagnosis not present

## 2019-08-12 DIAGNOSIS — I110 Hypertensive heart disease with heart failure: Secondary | ICD-10-CM | POA: Insufficient documentation

## 2019-08-12 DIAGNOSIS — Z85528 Personal history of other malignant neoplasm of kidney: Secondary | ICD-10-CM | POA: Insufficient documentation

## 2019-08-12 DIAGNOSIS — F419 Anxiety disorder, unspecified: Secondary | ICD-10-CM | POA: Diagnosis not present

## 2019-08-12 DIAGNOSIS — Z87891 Personal history of nicotine dependence: Secondary | ICD-10-CM | POA: Diagnosis not present

## 2019-08-12 DIAGNOSIS — Z79899 Other long term (current) drug therapy: Secondary | ICD-10-CM | POA: Diagnosis not present

## 2019-08-12 DIAGNOSIS — Z01818 Encounter for other preprocedural examination: Secondary | ICD-10-CM | POA: Insufficient documentation

## 2019-08-12 LAB — CBC
HCT: 39.7 % (ref 36.0–46.0)
Hemoglobin: 12.5 g/dL (ref 12.0–15.0)
MCH: 30.5 pg (ref 26.0–34.0)
MCHC: 31.5 g/dL (ref 30.0–36.0)
MCV: 96.8 fL (ref 80.0–100.0)
Platelets: 299 10*3/uL (ref 150–400)
RBC: 4.1 MIL/uL (ref 3.87–5.11)
RDW: 13.8 % (ref 11.5–15.5)
WBC: 7.2 10*3/uL (ref 4.0–10.5)
nRBC: 0 % (ref 0.0–0.2)

## 2019-08-12 LAB — BASIC METABOLIC PANEL
Anion gap: 9 (ref 5–15)
BUN: 17 mg/dL (ref 8–23)
CO2: 27 mmol/L (ref 22–32)
Calcium: 9.7 mg/dL (ref 8.9–10.3)
Chloride: 102 mmol/L (ref 98–111)
Creatinine, Ser: 1.22 mg/dL — ABNORMAL HIGH (ref 0.44–1.00)
GFR calc Af Amer: 52 mL/min — ABNORMAL LOW (ref >60)
GFR calc non Af Amer: 45 mL/min — ABNORMAL LOW (ref >60)
Glucose, Bld: 119 mg/dL — ABNORMAL HIGH (ref 70–99)
Potassium: 4.4 mmol/L (ref 3.5–5.1)
Sodium: 138 mmol/L (ref 135–145)

## 2019-08-14 ENCOUNTER — Other Ambulatory Visit (HOSPITAL_COMMUNITY)
Admission: RE | Admit: 2019-08-14 | Discharge: 2019-08-14 | Disposition: A | Discharge status: Home / Self Care | Payer: Medicare PPO | Source: Ambulatory Visit | Attending: Urology | Admitting: Urology

## 2019-08-14 DIAGNOSIS — Z20828 Contact with and (suspected) exposure to other viral communicable diseases: Secondary | ICD-10-CM | POA: Insufficient documentation

## 2019-08-14 DIAGNOSIS — Z01812 Encounter for preprocedural laboratory examination: Secondary | ICD-10-CM | POA: Diagnosis not present

## 2019-08-15 LAB — NOVEL CORONAVIRUS, NAA (HOSP ORDER, SEND-OUT TO REF LAB; TAT 18-24 HRS): SARS-CoV-2, NAA: NOT DETECTED

## 2019-08-15 NOTE — Anesthesia Preprocedure Evaluation (Addendum)
Anesthesia Evaluation  Patient identified by MRN, date of birth, ID band Patient awake    Reviewed: Allergy & Precautions, H&P , NPO status , Patient's Chart, lab work & pertinent test results, reviewed documented beta blocker date and time   Airway Mallampati: II  TM Distance: >3 FB Neck ROM: Full    Dental no notable dental hx. (+) Teeth Intact, Dental Advisory Given   Pulmonary neg pulmonary ROS, former smoker,    Pulmonary exam normal breath sounds clear to auscultation       Cardiovascular Exercise Tolerance: Good hypertension, Pt. on medications and Pt. on home beta blockers  Rhythm:Regular Rate:Normal     Neuro/Psych Anxiety Depression negative neurological ROS     GI/Hepatic negative GI ROS, Neg liver ROS,   Endo/Other  negative endocrine ROS  Renal/GU Renal InsufficiencyRenal disease  negative genitourinary   Musculoskeletal   Abdominal   Peds  Hematology negative hematology ROS (+)   Anesthesia Other Findings   Reproductive/Obstetrics negative OB ROS                           Anesthesia Physical Anesthesia Plan  ASA: II  Anesthesia Plan: General   Post-op Pain Management:    Induction: Intravenous  PONV Risk Score and Plan: 4 or greater and Ondansetron, Dexamethasone and Midazolam  Airway Management Planned: Oral ETT and LMA  Additional Equipment:   Intra-op Plan:   Post-operative Plan: Extubation in OR  Informed Consent: I have reviewed the patients History and Physical, chart, labs and discussed the procedure including the risks, benefits and alternatives for the proposed anesthesia with the patient or authorized representative who has indicated his/her understanding and acceptance.     Dental advisory given  Plan Discussed with: CRNA  Anesthesia Plan Comments: (See PAT note 08/12/2019, Konrad Felix, PA-C)       Anesthesia Quick Evaluation

## 2019-08-15 NOTE — Progress Notes (Signed)
Anesthesia Chart Review   Case: [id number] Date/Time: 08/18/19 0715   Procedures:      CYSTOSCOPY WITH RETROGRADE PYELOGRAM, URETEROSCOPY AND STENT PLACEMENT (Left ) - 31 MINS     CYSTOSCOPY WITH STENT REMOVAL (Left )   Anesthesia type: General   Pre-op diagnosis: LEFT URETERAL CARCINOMA   Location: [place] [facility]   Surgeon: Franchot Gallo, MD      DISCUSSION:73 y.o. former smoker (19 pack years, quit 11/26/03) with h/o HLD, HTN, chronic systolic and diastolic CHF, left ureteral carcinoma scheduled for above procedure 08/18/2019 with Dr. Franchot Gallo.   Pt last seen by cardiology 07/30/2019.  Seen by Leanor Kail, PA-C.  Per OV note, "She is doing well without any exacerbation of symptoms. Given past medical history and time since last visit, based on ACC/AHA guidelines, BERYL BLUCHER would be at acceptable risk for the planned procedure without further cardiovascular testing."  Anticipate pt can proceed with planned procedure barring acute status change.   VS: BP (!) 136/57   Pulse 83   Temp 36.8 C (Oral)   Resp 16   Ht 5\' 3"  (1.6 m)   Wt 77.5 kg   SpO2 96%   BMI 30.27 kg/m   PROVIDERS: Koirala, Dibas, MD is PCP   Nahser, MD is Cardiologist  LABS: Labs reviewed: Acceptable for surgery. (all labs ordered are listed, but only abnormal results are displayed)  Labs Reviewed  BASIC METABOLIC PANEL - Abnormal; Notable for the following components:      Result Value   Glucose, Bld 119 (*)    Creatinine, Ser 1.22 (*)    GFR calc non Af Amer 45 (*)    GFR calc Af Amer 52 (*)    All other components within normal limits  CBC     IMAGES:   EKG: 07/30/2019 Rate 62 bpm Normal sinus rhythm   CV: Echo 06/28/2019 IMPRESSIONS   1. Left ventricular ejection fraction, by visual estimation, is 40-45% with diffuse hypokinesis. The left ventricle has normal function. Normal left ventricular size. There is moderately increased left ventricular  hypertrophy.  2. Elevated left ventricular end-diastolic pressure.  3. Left ventricular diastolic Doppler parameters are consistent with impaired relaxation pattern of LV diastolic filling.  4. Global right ventricle has normal systolic function.The right ventricular size is normal. No increase in right ventricular wall thickness.  5. Left atrial size was mildly dilated.  6. Right atrial size was normal.  7. Trivial pericardial effusion is present.  8. The mitral valve is normal in structure. Moderate mitral valve regurgitation. No evidence of mitral stenosis.  9. The tricuspid valve is normal in structure. Tricuspid valve regurgitation is mild. 10. The aortic valve is normal in structure. Aortic valve regurgitation was not visualized by color flow Doppler. Mild aortic valve sclerosis without stenosis. 11. The pulmonic valve was normal in structure. Pulmonic valve regurgitation is mild by color flow Doppler. 12. The inferior vena cava is normal in size with greater than 50% respiratory variability, suggesting right atrial pressure of 3 mmHg. 13. Normal pulmonary artery systolic pressure.  FINDINGS  Left Ventricle: Left ventricular ejection fraction, by visual estimation, is 40 to 45%. The left ventricle has mild to moderately decreased function. There is moderately increased left ventricular hypertrophy. Concentric left ventricular hypertrophy.  Past Medical History:  Diagnosis Date  . Anxiety   . Cancer ([place])   . Depression   . History of kidney cancer   . Hyperlipidemia   . Hypertension  Past Surgical History:  Procedure Laterality Date  . bladder-partial removal    . CYSTOSCOPY WITH RETROGRADE PYELOGRAM, URETEROSCOPY AND STENT PLACEMENT Left 06/27/2019   Procedure: CYSTOSCOPY WITH LEFT RETROGRADE PYELOGRAM, URETEROSCOPY, BIOPSY AND LEFT STENT PLACEMENT;  Surgeon: Irine Seal, MD;  Location: [facility];  Service: Urology;  Laterality: Left;  . kidney removed      MEDICATIONS: .  acetaminophen (TYLENOL) 500 MG tablet  . amLODipine (NORVASC) 5 MG tablet  . Calcium 500 MG tablet  . carvedilol (COREG) 6.25 MG tablet  . cholecalciferol (VITAMIN D) 1000 UNITS tablet  . hydrALAZINE (APRESOLINE) 50 MG tablet  . isosorbide mononitrate (IMDUR) 30 MG 24 hr tablet  . sertraline (ZOLOFT) 50 MG tablet  . simvastatin (ZOCOR) 20 MG tablet  . spironolactone (ALDACTONE) 25 MG tablet  . traZODone (DESYREL) 50 MG tablet   No current facility-administered medications for this encounter.     Maia Plan [facility] Pre-Surgical Testing [phone] 08/15/19  1:06 PM

## 2019-08-18 ENCOUNTER — Other Ambulatory Visit: Payer: Self-pay

## 2019-08-18 ENCOUNTER — Ambulatory Visit (HOSPITAL_COMMUNITY): Payer: Medicare PPO | Admitting: Physician Assistant

## 2019-08-18 ENCOUNTER — Encounter (HOSPITAL_COMMUNITY): Payer: Self-pay

## 2019-08-18 ENCOUNTER — Encounter (HOSPITAL_COMMUNITY)
Admission: RE | Disposition: A | Discharge status: Home / Self Care | Payer: Self-pay | Source: Home / Self Care | Attending: Urology

## 2019-08-18 ENCOUNTER — Ambulatory Visit (HOSPITAL_COMMUNITY): Payer: Medicare PPO | Admitting: Anesthesiology

## 2019-08-18 ENCOUNTER — Ambulatory Visit (HOSPITAL_COMMUNITY): Payer: Medicare PPO

## 2019-08-18 ENCOUNTER — Ambulatory Visit (HOSPITAL_COMMUNITY)
Admission: RE | Admit: 2019-08-18 | Discharge: 2019-08-18 | Disposition: A | Discharge status: Home / Self Care | Payer: Medicare PPO | Attending: Urology | Admitting: Urology

## 2019-08-18 DIAGNOSIS — Z906 Acquired absence of other parts of urinary tract: Secondary | ICD-10-CM | POA: Insufficient documentation

## 2019-08-18 DIAGNOSIS — Z8249 Family history of ischemic heart disease and other diseases of the circulatory system: Secondary | ICD-10-CM | POA: Diagnosis not present

## 2019-08-18 DIAGNOSIS — Z8554 Personal history of malignant neoplasm of ureter: Secondary | ICD-10-CM | POA: Insufficient documentation

## 2019-08-18 DIAGNOSIS — Z888 Allergy status to other drugs, medicaments and biological substances status: Secondary | ICD-10-CM | POA: Diagnosis not present

## 2019-08-18 DIAGNOSIS — I10 Essential (primary) hypertension: Secondary | ICD-10-CM | POA: Diagnosis not present

## 2019-08-18 DIAGNOSIS — N1330 Unspecified hydronephrosis: Secondary | ICD-10-CM | POA: Insufficient documentation

## 2019-08-18 DIAGNOSIS — Z8551 Personal history of malignant neoplasm of bladder: Secondary | ICD-10-CM | POA: Diagnosis not present

## 2019-08-18 DIAGNOSIS — F419 Anxiety disorder, unspecified: Secondary | ICD-10-CM | POA: Insufficient documentation

## 2019-08-18 DIAGNOSIS — Z881 Allergy status to other antibiotic agents status: Secondary | ICD-10-CM | POA: Insufficient documentation

## 2019-08-18 DIAGNOSIS — C662 Malignant neoplasm of left ureter: Secondary | ICD-10-CM | POA: Diagnosis not present

## 2019-08-18 DIAGNOSIS — Z79899 Other long term (current) drug therapy: Secondary | ICD-10-CM | POA: Diagnosis not present

## 2019-08-18 DIAGNOSIS — E785 Hyperlipidemia, unspecified: Secondary | ICD-10-CM | POA: Insufficient documentation

## 2019-08-18 DIAGNOSIS — Z833 Family history of diabetes mellitus: Secondary | ICD-10-CM | POA: Insufficient documentation

## 2019-08-18 DIAGNOSIS — Z87891 Personal history of nicotine dependence: Secondary | ICD-10-CM | POA: Diagnosis not present

## 2019-08-18 DIAGNOSIS — F329 Major depressive disorder, single episode, unspecified: Secondary | ICD-10-CM | POA: Diagnosis not present

## 2019-08-18 DIAGNOSIS — Z905 Acquired absence of kidney: Secondary | ICD-10-CM | POA: Insufficient documentation

## 2019-08-18 DIAGNOSIS — E876 Hypokalemia: Secondary | ICD-10-CM | POA: Diagnosis not present

## 2019-08-18 HISTORY — PX: THULIUM LASER TURP (TRANSURETHRAL RESECTION OF PROSTATE): SHX6744

## 2019-08-18 HISTORY — PX: CYSTOSCOPY WITH RETROGRADE PYELOGRAM, URETEROSCOPY AND STENT PLACEMENT: SHX5789

## 2019-08-18 SURGERY — CYSTOURETEROSCOPY, WITH RETROGRADE PYELOGRAM AND STENT INSERTION
Anesthesia: General | Site: Ureter | Laterality: Left

## 2019-08-18 MED ORDER — EPHEDRINE SULFATE-NACL 50-0.9 MG/10ML-% IV SOSY
PREFILLED_SYRINGE | INTRAVENOUS | Status: DC | PRN
  Administered 2019-08-18: 5 mg via INTRAVENOUS
  Administered 2019-08-18: 10 mg via INTRAVENOUS
  Administered 2019-08-18: 5 mg via INTRAVENOUS

## 2019-08-18 MED ORDER — FENTANYL CITRATE (PF) 100 MCG/2ML IJ SOLN
INTRAMUSCULAR | Status: DC | PRN
  Administered 2019-08-18: 25 ug via INTRAVENOUS

## 2019-08-18 MED ORDER — SODIUM CHLORIDE 0.9 % IR SOLN
Status: DC | PRN
  Administered 2019-08-18: 3000 mL via INTRAVESICAL

## 2019-08-18 MED ORDER — DEXAMETHASONE SODIUM PHOSPHATE 10 MG/ML IJ SOLN
INTRAMUSCULAR | Status: AC
  Filled 2019-08-18: qty 1

## 2019-08-18 MED ORDER — FENTANYL CITRATE (PF) 100 MCG/2ML IJ SOLN
25.0000 ug | INTRAMUSCULAR | Status: DC | PRN
  Administered 2019-08-18: 50 ug via INTRAVENOUS

## 2019-08-18 MED ORDER — SODIUM CHLORIDE 0.9 % IV SOLN: Freq: Once | INTRAVENOUS | Status: DC

## 2019-08-18 MED ORDER — FENTANYL CITRATE (PF) 100 MCG/2ML IJ SOLN
INTRAMUSCULAR | Status: AC
  Filled 2019-08-18: qty 2

## 2019-08-18 MED ORDER — PHENYLEPHRINE 40 MCG/ML (10ML) SYRINGE FOR IV PUSH (FOR BLOOD PRESSURE SUPPORT)
PREFILLED_SYRINGE | INTRAVENOUS | Status: DC | PRN
  Administered 2019-08-18: 80 ug via INTRAVENOUS

## 2019-08-18 MED ORDER — ONDANSETRON HCL 4 MG/2ML IJ SOLN
INTRAMUSCULAR | Status: DC | PRN
  Administered 2019-08-18: 4 mg via INTRAVENOUS

## 2019-08-18 MED ORDER — CEPHALEXIN 500 MG PO CAPS: 500.0000 mg | ORAL_CAPSULE | Freq: Two times a day (BID) | ORAL | 0 refills | Status: DC

## 2019-08-18 MED ORDER — PROPOFOL 10 MG/ML IV BOLUS
INTRAVENOUS | Status: DC | PRN
  Administered 2019-08-18: 130 mg via INTRAVENOUS

## 2019-08-18 MED ORDER — CEFAZOLIN SODIUM-DEXTROSE 2-4 GM/100ML-% IV SOLN: 2.0000 g | Freq: Once | INTRAVENOUS | Status: DC

## 2019-08-18 MED ORDER — LACTATED RINGERS IV SOLN
INTRAVENOUS | Status: DC
  Administered 2019-08-18 (×3): via INTRAVENOUS

## 2019-08-18 MED ORDER — ONDANSETRON HCL 4 MG/2ML IJ SOLN
INTRAMUSCULAR | Status: AC
  Filled 2019-08-18: qty 2

## 2019-08-18 MED ORDER — PROPOFOL 10 MG/ML IV BOLUS
INTRAVENOUS | Status: AC
  Filled 2019-08-18: qty 20

## 2019-08-18 MED ORDER — EPHEDRINE 5 MG/ML INJ
INTRAVENOUS | Status: AC
  Filled 2019-08-18: qty 10

## 2019-08-18 MED ORDER — ACETAMINOPHEN 500 MG PO TABS
1000.0000 mg | ORAL_TABLET | Freq: Once | ORAL | Status: AC
  Administered 2019-08-18: 1000 mg via ORAL
  Filled 2019-08-18: qty 2

## 2019-08-18 MED ORDER — PHENYLEPHRINE 40 MCG/ML (10ML) SYRINGE FOR IV PUSH (FOR BLOOD PRESSURE SUPPORT)
PREFILLED_SYRINGE | INTRAVENOUS | Status: AC
  Filled 2019-08-18: qty 10

## 2019-08-18 MED ORDER — 0.9 % SODIUM CHLORIDE (POUR BTL) OPTIME
TOPICAL | Status: DC | PRN
  Administered 2019-08-18: 1000 mL

## 2019-08-18 MED ORDER — LIDOCAINE 2% (20 MG/ML) 5 ML SYRINGE
INTRAMUSCULAR | Status: DC | PRN
  Administered 2019-08-18: 50 mg via INTRAVENOUS

## 2019-08-18 MED ORDER — CEFAZOLIN SODIUM-DEXTROSE 2-4 GM/100ML-% IV SOLN
INTRAVENOUS | Status: AC
  Filled 2019-08-18: qty 100

## 2019-08-18 MED ORDER — DEXAMETHASONE SODIUM PHOSPHATE 10 MG/ML IJ SOLN
INTRAMUSCULAR | Status: DC | PRN
  Administered 2019-08-18: 8 mg via INTRAVENOUS

## 2019-08-18 SURGICAL SUPPLY — 23 items
BAG URO CATCHER STRL LF (MISCELLANEOUS) ×4 IMPLANT
BASKET LASER NITINOL 1.9FR (BASKET) IMPLANT
BASKET ZERO TIP NITINOL 2.4FR (BASKET) ×4 IMPLANT
BSKT STON RTRVL ZERO TP 2.4FR (BASKET) ×1
CATH INTERMIT  6FR 70CM (CATHETERS) ×4 IMPLANT
CLOTH BEACON ORANGE TIMEOUT ST (SAFETY) IMPLANT
FIBER LASER FLEXIVA 365 (UROLOGICAL SUPPLIES) IMPLANT
FIBER LASER TRAC TIP (UROLOGICAL SUPPLIES) IMPLANT
GLOVE BIOGEL M 8.0 STRL (GLOVE) ×4 IMPLANT
GOWN STRL REUS W/TWL XL LVL3 (GOWN DISPOSABLE) ×4 IMPLANT
GUIDEWIRE ANG ZIPWIRE 038X150 (WIRE) IMPLANT
GUIDEWIRE STR DUAL SENSOR (WIRE) ×4 IMPLANT
IV NS 1000ML (IV SOLUTION)
IV NS 1000ML BAXH (IV SOLUTION) IMPLANT
KIT TURNOVER KIT A (KITS) ×4 IMPLANT
LASER CYBER 200W 1CASE/PERSON (MISCELLANEOUS) ×4 IMPLANT
MANIFOLD NEPTUNE II (INSTRUMENTS) ×4 IMPLANT
PACK CYSTO (CUSTOM PROCEDURE TRAY) ×4 IMPLANT
SHEATH URETERAL 12FRX35CM (MISCELLANEOUS) ×4 IMPLANT
STENT URET 6FRX24 CONTOUR (STENTS) ×4 IMPLANT
TUBING CONNECTING 10 (TUBING) ×3 IMPLANT
TUBING CONNECTING 10' (TUBING) ×1
TUBING UROLOGY SET (TUBING) ×4 IMPLANT

## 2019-08-18 NOTE — Anesthesia Postprocedure Evaluation (Signed)
Anesthesia Post Note  Patient: [name]  Procedure(s) Performed: CYSTOSCOPY, URETEROSCOPY AND STENT EXCHANGE (Left ) THULIUM LASER ABLATION OF URETERAL TUMOR (Left Ureter)     Patient location during evaluation: PACU Anesthesia Type: General Level of consciousness: awake and alert Pain management: pain level controlled Vital Signs Assessment: post-procedure vital signs reviewed and stable Respiratory status: spontaneous breathing, nonlabored ventilation and respiratory function stable Cardiovascular status: blood pressure returned to baseline and stable Postop Assessment: no apparent nausea or vomiting Anesthetic complications: no    Last Vitals:  Vitals:   08/18/19 0900 08/18/19 0915  BP: (!) 107/56 (!) 120/59  Pulse: 71 70  Resp: 13 14  Temp:  (!) 36.4 C  SpO2: 100% 92%    Last Pain:  Vitals:   08/18/19 0930  TempSrc:   PainSc: 1                  Kanoelani Dobies,W. EDMOND

## 2019-08-18 NOTE — H&P (Signed)
H&P  Chief Complaint: Cancer in left ureter  History of Present Illness: [name] is a 73 y.o. year old female presenting for cysto, left J2 stent removal, left ureteroscopy w/  laser ablation and bx of left ureteral tumors. She has a h/o TURBT of TCCa and left nephroureterectomy for distal ureteral tumor in May 2008. She had NED until presenting [place].  Past Medical History:  Diagnosis Date  . Anxiety   . Cancer ([place])   . Depression   . History of kidney cancer   . Hyperlipidemia   . Hypertension     Past Surgical History:  Procedure Laterality Date  . bladder-partial removal    . CYSTOSCOPY WITH RETROGRADE PYELOGRAM, URETEROSCOPY AND STENT PLACEMENT Left 06/27/2019   Procedure: CYSTOSCOPY WITH LEFT RETROGRADE PYELOGRAM, URETEROSCOPY, BIOPSY AND LEFT STENT PLACEMENT;  Surgeon: Irine Seal, MD;  Location: [facility];  Service: Urology;  Laterality: Left;  . kidney removed      Home Medications:  Medications Prior to Admission  Medication Sig Dispense Refill  . acetaminophen (TYLENOL) 500 MG tablet Take 500 mg by mouth every 6 (six) hours as needed (for pain.).    Marland Kitchen amLODipine (NORVASC) 5 MG tablet Take 1 tablet (5 mg total) by mouth daily. 90 tablet 3  . Calcium 500 MG tablet Take 500 mg by mouth daily.    . carvedilol (COREG) 6.25 MG tablet Take 1 tablet (6.25 mg total) by mouth 2 (two) times daily with a meal. 60 tablet 1  . cholecalciferol (VITAMIN D) 1000 UNITS tablet Take 1,000 Units by mouth daily.    . hydrALAZINE (APRESOLINE) 50 MG tablet Take 1 tablet (50 mg total) by mouth every 8 (eight) hours. 270 tablet 3  . isosorbide mononitrate (IMDUR) 30 MG 24 hr tablet Take 1 tablet (30 mg total) by mouth daily. 90 tablet 3  . sertraline (ZOLOFT) 50 MG tablet Take 50 mg by mouth daily.    . simvastatin (ZOCOR) 20 MG tablet Take 20 mg by mouth daily.    Marland Kitchen spironolactone (ALDACTONE) 25 MG tablet Take 1 tablet (25 mg total) by mouth daily. 90 tablet 3  . traZODone (DESYREL)  50 MG tablet Take 100 mg by mouth at bedtime.      Allergies:  Allergies  Allergen Reactions  . Erythromycin Diarrhea and Nausea And Vomiting  . Lotensin [Benazepril Hcl] Cough    Family History  Problem Relation Age of Onset  . Hypertension Mother   . Diabetes Mellitus II Sister   . Hypertension Sister   . Hypertension Brother     Social History:  reports that she quit smoking about 15 years ago. Her smoking use included cigarettes. She has a 19.00 pack-year smoking history. She has never used smokeless tobacco. She reports that she does not drink alcohol or use drugs.  ROS: A complete review of systems was performed.  All systems are negative except for pertinent findings as noted.  Physical Exam:  Vital signs in last 24 hours: Temp:  [97.8 F (36.6 C)] 97.8 F (36.6 C) (11/09 0537) Pulse Rate:  [66] 66 (11/09 0537) Resp:  [17] 17 (11/09 0537) BP: (113)/(62) 113/62 (11/09 0537) SpO2:  [97 %] 97 % (11/09 0537) General:  Alert and oriented, No acute distress HEENT: Normocephalic, atraumatic Neck: No JVD or lymphadenopathy Cardiovascular: Regular rate and rhythm Lungs: Clear bilaterally Abdomen: Soft, nontender, nondistended, no abdominal masses Back: No CVA tenderness Extremities: No edema Neurologic: Grossly intact  Laboratory Data:  No results  found for this or any previous visit (from the past 24 hour(s)). Recent Results (from the past 240 hour(s))  Novel Coronavirus, NAA (Hosp order, Send-out to Ref Lab; TAT 18-24 hrs     Status: None   Collection Time: 08/14/19  2:42 PM   Specimen: Nasopharyngeal Swab; Respiratory  Result Value Ref Range Status   SARS-CoV-2, NAA NOT DETECTED NOT DETECTED Final    Comment: (NOTE) This nucleic acid amplification test was developed and its performance characteristics determined by [employer]. Nucleic acid amplification tests include PCR and TMA. This test has not been FDA cleared or approved. This test has been  authorized by FDA under an Emergency Use Authorization (EUA). This test is only authorized for the duration of time the declaration that circumstances exist justifying the authorization of the emergency use of in vitro diagnostic tests for detection of SARS-CoV-2 virus and/or diagnosis of COVID-19 infection under section 564(b)(1) of the Act, 21 U.S.C. GF:7541899) (1), unless the authorization is terminated or revoked sooner. When diagnostic testing is negative, the possibility of a false negative result should be considered in the context of a patient's recent exposures and the presence of clinical signs and symptoms consistent with COVID-19. An individual without symptoms of COVID- 19 and who is not shedding SARS-CoV-2 vi rus would expect to have a negative (not detected) result in this assay. Performed At: [facility] [address] [place], [place] [id number] Rush Farmer MD [id number]    [place]  Final    Comment: Performed at [facility], [place] [address], [place], [place] [address]   Creatinine: Recent Labs    08/12/19 1040  CREATININE 1.22*    Radiologic Imaging: No results found.  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 Hadasah Brugger MD

## 2019-08-18 NOTE — Transfer of Care (Signed)
Immediate Anesthesia Transfer of Care Note  Patient: [name]  Procedure(s) Performed: Procedure(s) with comments: CYSTOSCOPY, URETEROSCOPY AND STENT EXCHANGE (Left) - 29 MINS THULIUM LASER ABLATION OF URETERAL TUMOR (Left)  Patient Location: PACU  Anesthesia Type:General  Level of Consciousness:  sedated, patient cooperative and responds to stimulation  Airway & Oxygen Therapy:Patient Spontanous Breathing and Patient connected to face mask oxgen  Post-op Assessment:  Report given to PACU RN and Post -op Vital signs reviewed and stable  Post vital signs:  Reviewed and stable  Last Vitals:  Vitals:   08/18/19 0537  BP: 113/62  Pulse: 66  Resp: 17  Temp: 36.6 C  SpO2: 0000000    Complications: No apparent anesthesia complications

## 2019-08-18 NOTE — Op Note (Signed)
Preoperative diagnosis: Urothelial carcinoma of the left ureter with history of hydronephrosis, status post stenting  Postoperative diagnosis: Same  Principal procedure: Cystoscopy, extraction of left double-J stent, left ureteroscopy, thulium laser ablation of left mid/distal ureteral tumor, left renoscopy, replacement of 6 [place] by 24 cm contour double-J stent without tether, biopsy of left ureteral lesion  Surgeon: Lyah Millirons  Anesthesia: General with LMA  Complications: None  Specimen: Urine for culture, left ureteral biopsy  Estimated blood loss: None  Indications: 73 year old female with history of right  nephro ureterectomy in May 2008 for high-grade urothelial carcinoma of the right distal ureter.  She underwent transurethral resection of a small bladder tumor at that time.  She has had no evidence of recurrence until September of this year when she presented with acute renal failure and left hydronephrosis.  She was found to have probable tumor in her left distal ureter.  Cystoscopy, retrograde, ureteroscopy revealed papillary lesions in her left distal ureter around the level of her iliac vessels.  Stent was placed at that time.  She did have significant edema at that time.  Both her edema and her renal function have returned to her baseline.  She presents at this time for cystoscopy, stent removal, ureteroscopy with laser ablation of her tumor as well as ureteroscopy proximally to evaluate her upper tracts.  She understands the procedure as well as risks and complications and desires to proceed.  Findings: Bladder appeared normal without urothelial lesions.  Her stent was adequately positioned.  Following stent removal ureteral evaluation revealed 2 areas of papillary lesions in her mid/distal ureter.  These were approximately 3 cm in length in combination.  Proximally there was no evidence of urothelial lesions either in the proximal ureter or in the pyelocalyceal system.  Description  of procedure: The patient was properly identified and marked in the holding area.  She received preoperative IV antibiotics.  She was taken to the operating room where general anesthetic was administered with the LMA.  She was placed in the dorsolithotomy position.  Genitalia and perineum were prepped and draped.  Proper timeout was performed.  Cystoscopy was performed with a 21 [place] panendoscope.  Urothelial inspection revealed normal findings.  Urine was sent for culture.  The left ureteral stent was grasped and brought out through the meatus.  I then cannulated this with the sensor tip guidewire which was then advanced under fluoroscopic guidance to the upper pole calyces.  The stent was then removed.  I then passed the dual-lumen semirigid ureteroscope up the ureter.  The distal ureter was normal.  2 areas of papillary lesions were noted, these were mostly on the medial wall in the mid ureter.  Above this, all the way to the UPJ, inspected with the semirigid scope, I saw no other lesions.  The 76 m fiber was utilized in the thulium laser set at a power of 25 W was then utilized to ablate the papillary lesions.  Altogether approximately 30 minutes was spent on this.  I took care not to get too deep with the ablation.  After ablation, I knocked the fragments off with the beak of the scope.  In this manner, all papillary material was removed from the ureteral wall.  Inspection proximally and distally as well in the area of ablation revealed no further tumor.  At this point, the semirigid ureteroscope was removed.  Over top of the guidewire I passed a medium length ureteral access catheter.  I then passed the flexible ureteroscope up through  this and inspected the entire pyelocalyceal system.  No lesions were seen.  After I completed the systematic inspection, the scope was removed.  Guidewire was replaced, ureteral access catheter removed, and the guidewire was backloaded through the scope.  At this point a 6  [place] by 24 cm contour double-J stent was placed under direct vision but also using fluoroscopy.  Once the guidewire was removed, adequate deployment was verified using fluoroscopy and cystoscopy.  The bladder was drained.  The scope was removed.  At this point the procedure was terminated.  The patient was awakened following moving her to the supine position.  She was extubated and taken to the PACU in stable condition.  She tolerated the procedure well.

## 2019-08-18 NOTE — Anesthesia Procedure Notes (Signed)
Procedure Name: LMA Insertion Date/Time: 08/18/2019 7:50 AM Performed by: Anne Fu, CRNA Pre-anesthesia Checklist: Patient identified, Emergency Drugs available, Suction available, Patient being monitored and Timeout performed Patient Re-evaluated:Patient Re-evaluated prior to induction Oxygen Delivery Method: Circle system utilized Preoxygenation: Pre-oxygenation with 100% oxygen Induction Type: IV induction Ventilation: Mask ventilation without difficulty LMA: LMA inserted LMA Size: 4.0 Number of attempts: 1 Placement Confirmation: positive ETCO2 and breath sounds checked- equal and bilateral Tube secured with: Tape

## 2019-08-18 NOTE — Interval H&P Note (Signed)
History and Physical Interval Note:  08/18/2019 7:25 AM  [name]  has presented today for surgery, with the diagnosis of LEFT URETERAL CARCINOMA.  The various methods of treatment have been discussed with the patient and family. After consideration of risks, benefits and other options for treatment, the patient has consented to  Procedure(s) with comments: CYSTOSCOPY WITH RETROGRADE PYELOGRAM, URETEROSCOPY AND STENT PLACEMENT (Left) - 33 MINS CYSTOSCOPY WITH STENT REMOVAL (Left) as a surgical intervention.  The patient's history has been reviewed, patient examined, no change in status, stable for surgery.  I have reviewed the patient's chart and labs.  Questions were answered to the patient's satisfaction.     Lillette Boxer Saul Fabiano

## 2019-08-18 NOTE — Discharge Instructions (Signed)

## 2019-08-19 ENCOUNTER — Encounter (HOSPITAL_COMMUNITY): Payer: Self-pay | Admitting: Urology

## 2019-08-19 LAB — URINE CULTURE: Culture: NO GROWTH

## 2019-08-27 ENCOUNTER — Ambulatory Visit: Payer: Medicare PPO | Admitting: Physician Assistant

## 2019-08-27 ENCOUNTER — Encounter (HOSPITAL_COMMUNITY): Payer: Self-pay | Admitting: Urology

## 2019-08-28 ENCOUNTER — Encounter (HOSPITAL_COMMUNITY): Payer: Self-pay | Admitting: Urology

## 2019-09-02 ENCOUNTER — Other Ambulatory Visit: Payer: Self-pay | Admitting: Cardiovascular Disease

## 2019-09-02 MED ORDER — CARVEDILOL 6.25 MG PO TABS: 6.2500 mg | ORAL_TABLET | Freq: Two times a day (BID) | ORAL | 3 refills | Status: DC

## 2019-09-02 NOTE — Telephone Encounter (Signed)
Pt's medication was sent to pt's pharmacy as requested. Confirmation received.  °

## 2019-09-30 DIAGNOSIS — I10 Essential (primary) hypertension: Secondary | ICD-10-CM | POA: Diagnosis not present

## 2019-09-30 DIAGNOSIS — I5022 Chronic systolic (congestive) heart failure: Secondary | ICD-10-CM | POA: Diagnosis not present

## 2019-09-30 DIAGNOSIS — C662 Malignant neoplasm of left ureter: Secondary | ICD-10-CM | POA: Diagnosis not present

## 2019-09-30 DIAGNOSIS — G4700 Insomnia, unspecified: Secondary | ICD-10-CM | POA: Diagnosis not present

## 2019-09-30 DIAGNOSIS — N183 Chronic kidney disease, stage 3 unspecified: Secondary | ICD-10-CM | POA: Diagnosis not present

## 2019-10-10 DIAGNOSIS — I2510 Atherosclerotic heart disease of native coronary artery without angina pectoris: Secondary | ICD-10-CM

## 2019-10-10 HISTORY — DX: Atherosclerotic heart disease of native coronary artery without angina pectoris: I25.10

## 2019-10-16 DIAGNOSIS — N183 Chronic kidney disease, stage 3 unspecified: Secondary | ICD-10-CM | POA: Diagnosis not present

## 2019-10-21 ENCOUNTER — Telehealth: Payer: Self-pay | Admitting: Oncology

## 2019-10-21 NOTE — Telephone Encounter (Signed)
Received a new pt referral from Dr. Diona Fanti for recurrent urothelial carcinoma in her solitary left ureter. Pt has been cld and scheduled to see Dr. Alen Blew on 1/14 at 2pm. Pt aware to arrive 15 minutes early.

## 2019-10-23 ENCOUNTER — Other Ambulatory Visit: Payer: Self-pay

## 2019-10-23 ENCOUNTER — Inpatient Hospital Stay: Payer: Medicare PPO | Attending: Oncology | Admitting: Oncology

## 2019-10-23 ENCOUNTER — Telehealth: Payer: Self-pay | Admitting: Oncology

## 2019-10-23 VITALS — BP 114/52 | HR 71 | Temp 97.9°F | Resp 17 | Ht 63.0 in | Wt 170.2 lb

## 2019-10-23 DIAGNOSIS — C679 Malignant neoplasm of bladder, unspecified: Secondary | ICD-10-CM | POA: Diagnosis not present

## 2019-10-23 DIAGNOSIS — Z87891 Personal history of nicotine dependence: Secondary | ICD-10-CM | POA: Diagnosis not present

## 2019-10-23 DIAGNOSIS — R079 Chest pain, unspecified: Secondary | ICD-10-CM | POA: Diagnosis not present

## 2019-10-23 NOTE — Progress Notes (Signed)
Reason for the request: Urothelial carcinoma  HPI: I was asked by Dr. Diona Fanti to evaluate [name] for recurrent urothelial carcinoma of the genitourinary tract.  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  At that time she had a right nephroureterectomy for a distal ureter tumor and 2008.  The final pathology showed a papillary high-grade urothelial carcinoma involving the ureter measuring 0.4 cm.  She had been on active surveillance since that time and had a repeat cystoscopy in September 2020 after presenting with acute renal failure found to have a left distal ureteral obstruction.  She underwent cystoscopy on June 27, 2019 and found to have recurrence of a high-grade papillary urothelial carcinoma with the muscularis propria not present at that time.  CT scan at that time showed mild left hydronephrosis at the level of the pelvis and a soft tissue material was noted within the ureter.  She had a repeat cystoscopy and exchange of a left double-J stent performed by Dr. Diona Fanti on November 9 of 2020.  He denies any complaints at this time.  She denies any hematuria, flank pain or discomfort.  Her performance status and activity level remains unchanged.   She does not report any headaches, blurry vision, syncope or seizures. Does not report any fevers, chills or sweats.  Does not report any cough, wheezing or hemoptysis.  Does not report any chest pain, palpitation, orthopnea or leg edema.  Does not report any nausea, vomiting or abdominal pain.  Does not report any constipation or diarrhea.  Does not report any skeletal complaints.    Does not report frequency, urgency or hematuria.  Does not report any skin rashes or lesions. Does not report any heat or cold intolerance.  Does not report any lymphadenopathy or petechiae.  Does not report any anxiety or depression.  Remaining review of systems is negative.    Past  Medical History:  Diagnosis Date  . Anxiety   . Cancer ([place])   . Depression   . History of kidney cancer   . Hyperlipidemia   . Hypertension   :  Past Surgical History:  Procedure Laterality Date  . bladder-partial removal    . CYSTOSCOPY WITH RETROGRADE PYELOGRAM, URETEROSCOPY AND STENT PLACEMENT Left 06/27/2019   Procedure: CYSTOSCOPY WITH LEFT RETROGRADE PYELOGRAM, URETEROSCOPY, BIOPSY AND LEFT STENT PLACEMENT;  Surgeon: Irine Seal, MD;  Location: [facility];  Service: Urology;  Laterality: Left;  . CYSTOSCOPY WITH RETROGRADE PYELOGRAM, URETEROSCOPY AND STENT PLACEMENT Left 08/18/2019   Procedure: CYSTOSCOPY, URETEROSCOPY AND STENT EXCHANGE;  Surgeon: Franchot Gallo, MD;  Location: [facility];  Service: Urology;  Laterality: Left;  13 MINS  . kidney removed    . THULIUM LASER TURP (TRANSURETHRAL RESECTION OF PROSTATE) Left 08/18/2019   Procedure: THULIUM LASER ABLATION OF URETERAL TUMOR;  Surgeon: Franchot Gallo, MD;  Location: [facility];  Service: Urology;  Laterality: Left;  :   Current Outpatient Medications:  .  acetaminophen (TYLENOL) 500 MG tablet, Take 500 mg by mouth every 6 (six) hours as needed (for pain.)., Disp: , Rfl:  .  amLODipine (NORVASC) 5 MG tablet, Take 1 tablet (5 mg total) by mouth daily., Disp: 90 tablet, Rfl: 3 .  Calcium 500 MG tablet, Take 500 mg by mouth daily., Disp: , Rfl:  .  carvedilol (COREG) 6.25 MG tablet, Take 1 tablet (6.25 mg total) by mouth 2 (two) times daily with a meal., Disp: 180 tablet, Rfl: 3 .  cephALEXin (KEFLEX) 500 MG capsule, Take 1 capsule (500 mg total) by mouth 2 (two) times daily., Disp: 6 capsule, Rfl: 0 .  cholecalciferol (VITAMIN D) 1000 UNITS tablet, Take 1,000 Units by mouth daily., Disp: , Rfl:  .  hydrALAZINE (APRESOLINE) 50 MG tablet, Take 1 tablet (50 mg total) by mouth every 8 (eight) hours., Disp: 270 tablet, Rfl: 3 .  isosorbide mononitrate (IMDUR) 30 MG 24 hr tablet, Take 1 tablet (30 mg total) by mouth daily., Disp: 90  tablet, Rfl: 3 .  sertraline (ZOLOFT) 50 MG tablet, Take 50 mg by mouth daily., Disp: , Rfl:  .  simvastatin (ZOCOR) 20 MG tablet, Take 20 mg by mouth daily., Disp: , Rfl:  .  spironolactone (ALDACTONE) 25 MG tablet, Take 1 tablet (25 mg total) by mouth daily., Disp: 90 tablet, Rfl: 3 .  traZODone (DESYREL) 50 MG tablet, Take 100 mg by mouth at bedtime., Disp: , Rfl: :  Allergies  Allergen Reactions  . Erythromycin Diarrhea and Nausea And Vomiting  . Lotensin [Benazepril Hcl] Cough  :  Family History  Problem Relation Age of Onset  . Hypertension Mother   . Diabetes Mellitus II Sister   . Hypertension Sister   . Hypertension Brother   :  Social History   Socioeconomic History  . Marital status: Married    Spouse name: Not on file  . Number of children: Not on file  . Years of education: Not on file  . Highest education level: Not on file  Occupational History  . Not on file  Tobacco Use  . Smoking status: Former Smoker    Packs/day: 0.50    Years: 38.00    Pack years: 19.00    Types: Cigarettes    Quit date: 11/26/2003    Years since quitting: 15.9  . Smokeless tobacco: Never Used  Substance and Sexual Activity  . Alcohol use: No    Alcohol/week: 0.0 standard drinks  . Drug use: No  . Sexual activity: Not on file  Other Topics Concern  . Not on file  Social History Narrative  . Not on file   Social Determinants of Health   Financial Resource Strain:   . Difficulty of Paying Living Expenses: Not on file  Food Insecurity:   . Worried About Charity fundraiser in the Last Year: Not on file  . Ran Out of Food in the Last Year: Not on file  Transportation Needs:   . Lack of Transportation (Medical): Not on file  . Lack of Transportation (Non-Medical): Not on file  Physical Activity:   . Days of Exercise per Week: Not on file  . Minutes of Exercise per Session: Not on file  Stress:   . Feeling of Stress : Not on file  Social Connections:   . Frequency of  Communication with Friends and Family: Not on file  . Frequency of Social Gatherings with Friends and Family: Not on file  . Attends Religious Services: Not on file  . Active Member of Clubs or Organizations: Not on file  . Attends Archivist Meetings: Not on file  . Marital Status: Not on file  Intimate Partner Violence:   . Fear of Current or Ex-Partner: Not on file  . Emotionally Abused: Not on file  . Physically Abused: Not on file  . Sexually Abused: Not on file  :  Pertinent items are noted in HPI.  Exam: Blood pressure (!) 114/52, pulse 71, temperature 97.9 F (36.6 C),  temperature source Temporal, resp. rate 17, height 5\' 3"  (1.6 m), weight 170 lb 3.2 oz (77.2 kg), SpO2 95 %.  ECOG 0  General appearance: alert and cooperative appeared without distress. Head: atraumatic without any abnormalities. Eyes: conjunctivae/corneas clear. PERRL.  Sclera anicteric. Throat: lips, mucosa, and tongue normal; without oral thrush or ulcers. Resp: clear to auscultation bilaterally without rhonchi, wheezes or dullness to percussion. Cardio: regular rate and rhythm, S1, S2 normal, no murmur, click, rub or gallop GI: soft, non-tender; bowel sounds normal; no masses,  no organomegaly Skin: Skin color, texture, turgor normal. No rashes or lesions Lymph nodes: Cervical, supraclavicular, and axillary nodes normal. Neurologic: Grossly normal without any motor, sensory or deep tendon reflexes. Musculoskeletal: No joint deformity or effusion.  CBC    Component Value Date/Time   WBC 7.2 08/12/2019 1040   RBC 4.10 08/12/2019 1040   HGB 12.5 08/12/2019 1040   HCT 39.7 08/12/2019 1040   PLT 299 08/12/2019 1040   MCV 96.8 08/12/2019 1040   MCH 30.5 08/12/2019 1040   MCHC 31.5 08/12/2019 1040   RDW 13.8 08/12/2019 1040   LYMPHSABS 1.9 06/26/2019 0535   MONOABS 0.9 06/26/2019 0535   EOSABS 0.4 06/26/2019 0535   BASOSABS 0.0 06/26/2019 0535     Chemistry      Component Value  Date/Time   NA 138 08/12/2019 1040   NA 137 07/14/2019 0954   K 4.4 08/12/2019 1040   CL 102 08/12/2019 1040   CO2 27 08/12/2019 1040   BUN 17 08/12/2019 1040   BUN 21 07/14/2019 0954   CREATININE 1.22 (H) 08/12/2019 1040      Component Value Date/Time   CALCIUM 9.7 08/12/2019 1040   ALKPHOS 49 06/29/2019 0544   AST 47 (H) 06/29/2019 0544   ALT 42 06/29/2019 0544   BILITOT 0.8 06/29/2019 0544       Assessment and Plan:   74 year old woman with:  1.  High-grade urothelial carcinoma of the distal ureter on the left side diagnosed in September 2020.  She presented with lower extremity swelling and found to have acute renal failure.  Cystoscopy at that time showed left distal ureter tumor that was surgically removed and a stent was placed at that time.  The final pathology showed high-grade urothelial carcinoma.  No muscle was identified at that time.  The natural course of this disease was reviewed and treatment options were reiterated.  Local therapy remains preferable at this time if it could be accomplished.  That would be in the form of recurrent resection and fulguration, intravesicular BCG or potential distal ureterectomy.  The role for systemic therapy was discussed today.  Will be in the form of systemic chemotherapy and immunotherapy.  Chemotherapy will be reserved for muscle invasive disease in the setting of neoadjuvant treatment before surgical resection.  The role for immunotherapy in the form of Pembrolizumab was discussed today.  This would be used for refractory nonmuscle invasive disease.  That would be an option if local therapy fails in the future.  Complication associated with this therapy was reviewed at this time to include immune mediated complications including nausea and dermatological toxicity.  At this time I recommended repeating cystoscopy and possible ureteroscopy to assess whether local therapy and surgical resection with sparing her remaining kidney would be  her best option.  If that is not an option or she continues to have recurrent disease, then Pembrolizumab would be a consideration.   All her questions were answered today to her  satisfaction.    2.  Follow-up in 3 months to reassess her clinical status and determine best course of action.   60  minutes was spent on this encounter.  The time was spent on reviewing her imaging studies, pathology reports, treatment options and addressing complications related to therapy.    Thank you for the referral. A copy of this consult has been forwarded to the requesting physician.

## 2019-10-23 NOTE — Telephone Encounter (Signed)
Scheduled appt per 1/14 los.  Sent a message to HIM pool to get a calendar mailed out. 

## 2019-10-31 DIAGNOSIS — N3000 Acute cystitis without hematuria: Secondary | ICD-10-CM | POA: Diagnosis not present

## 2019-11-03 ENCOUNTER — Encounter: Payer: Self-pay | Admitting: Cardiovascular Disease

## 2019-11-03 ENCOUNTER — Other Ambulatory Visit: Payer: Self-pay

## 2019-11-03 ENCOUNTER — Telehealth (INDEPENDENT_AMBULATORY_CARE_PROVIDER_SITE_OTHER): Payer: Medicare PPO | Admitting: Cardiovascular Disease

## 2019-11-03 VITALS — BP 114/51 | HR 68 | Ht 63.0 in | Wt 166.0 lb

## 2019-11-03 DIAGNOSIS — I5021 Acute systolic (congestive) heart failure: Secondary | ICD-10-CM | POA: Diagnosis not present

## 2019-11-03 DIAGNOSIS — I10 Essential (primary) hypertension: Secondary | ICD-10-CM

## 2019-11-03 DIAGNOSIS — I209 Angina pectoris, unspecified: Secondary | ICD-10-CM

## 2019-11-03 DIAGNOSIS — I5042 Chronic combined systolic (congestive) and diastolic (congestive) heart failure: Secondary | ICD-10-CM

## 2019-11-03 MED ORDER — ASPIRIN EC 81 MG PO TBEC: 81.0000 mg | DELAYED_RELEASE_TABLET | Freq: Every day | ORAL | Status: DC

## 2019-11-03 NOTE — Patient Instructions (Addendum)
Medication Instructions:  Your physician has recommended you make the following change in your medication:  START Aspirin 81 mg once daily  *If you need a refill on your cardiac medications before your next appointment, please call your pharmacy*  Lab Work: Your physician recommends that you return for lab work on Tuesday January 26 at 11:00 am  If you have labs (blood work) drawn today and your tests are completely normal, you will receive your results only by: Marland Kitchen MyChart Message (if you have MyChart) OR . A paper copy in the mail If you have any lab test that is abnormal or we need to change your treatment, we will call you to review the results.   Follow-Up: At [facility], you and your health needs are our priority.  As part of our continuing mission to provide you with exceptional heart care, we have created designated Provider Care Teams.  These Care Teams include your primary Cardiologist (physician) and Advanced Practice Providers (APPs -  Physician Assistants and Nurse Practitioners) who all work together to provide you with the care you need, when you need it.  Your next appointment:   3 month(s)  The format for your next appointment:   In Person  Provider:   Robbie Lis, PA-C on April 30 at 11:45 am   Testing/Procedures:   [name]  11/03/2019  You are scheduled for a Cardiac Catheterization on Thursday, January 28 with Dr. Daneen [name].  1. Please arrive at the [facility] (Main Entrance A) at [facility]: [address] [place], [place] [address] at 6:30 AM (This time is two hours before your procedure to ensure your preparation). Free valet parking service is available.   Special note: Every effort is made to have your procedure done on time. Please understand that emergencies sometimes delay scheduled procedures.  2. Diet: Do not eat solid foods after midnight.  The patient may have clear liquids until 5am upon the day of the procedure.  3.  Labs: You will need to have blood drawn on Tuesday, January 26 at [facility] at [facility] [address]  Open: 7:30am - 5pm    Phone: [phone]. You do not need to be fasting.  4. Medication instructions in preparation for your procedure: Hold Spironolactone (Aldactone) on Thursday morning   Contrast Allergy: No   On the morning of your procedure, take your Aspirin and any morning medicines NOT listed above.  You may use sips of water.  5. Plan for one night stay--bring personal belongings. 6. Bring a current list of your medications and current insurance cards. 7. You MUST have a responsible person to drive you home. 8. Someone MUST be with you the first 24 hours after you arrive home or your discharge will be delayed. 9. Please wear clothes that are easy to get on and off and wear slip-on shoes.  Thank you for allowing [place] to care for you!   -- [name]

## 2019-11-03 NOTE — Progress Notes (Signed)
Virtual Visit via Telephone Note   This visit type was conducted due to national recommendations for restrictions regarding the COVID-19 Pandemic (e.g. social distancing) in an effort to limit this patient's exposure and mitigate transmission in our community.  Due to her co-morbid illnesses, this patient is at least at moderate risk for complications without adequate follow up.  This format is felt to be most appropriate for this patient at this time.  The patient did not have access to video technology/had technical difficulties with video requiring transitioning to audio format only (telephone).  All issues noted in this document were discussed and addressed.  No physical exam could be performed with this format.  Please refer to the patient's chart for her  consent to telehealth for [facility]   Date:  11/03/2019   ID:  [name], DOB 1946/03/13, MRN [id number]  Patient Location: Home Provider Location: Office  PCP:  [name], MD   Cardiologist:  Carrington Olazabal  Electrophysiologist:  None   Evaluation Performed:  Follow-Up Visit  Chief Complaint:  Chest pain .   History of Present Illness:    [name] is a 74 y.o. female with history of congestive heart failure, hypertension and acute renal insufficiency  She was admitted to the hospital in September, 2020 with acute congestive heart failure as well as renal insufficiency. Echocardiogram from September, 2020 reveals mildly reduced left ventricular systolic function with an ejection fraction of 40 to 45%.  She had a Myoview study which was low risk.  There was no areas of ischemia.  Her renal function improved but her creatinine was still 1.7 at the time of discharge.  We did not pursue heart catheterization because of her creatinine.  The plan is to continue with medical therapy as long as she is doing well from a heart failure standpoint.  If she does not do well for and develops recurrent heart failure we should  consider heart catheterization.  Has been complaining of left chest pain and below her left breast.  Had CP yesterday .  Walks on occasion .  Not vigorously .   Pain will last for hours .  Not associated with eating or drinking . No associated dyspnea, no syncope or presyncope   Last creatinine from Jan. 7 2021 was 1.29 .   No CHF symptoms,   Is watching her diet ,  Eating less salt     The patient does not have symptoms concerning for COVID-19 infection (fever, chills, cough, or new shortness of breath).    Past Medical History:  Diagnosis Date  . Anxiety   . Cancer ([place])   . Depression   . History of kidney cancer   . Hyperlipidemia   . Hypertension    Past Surgical History:  Procedure Laterality Date  . bladder-partial removal    . CYSTOSCOPY WITH RETROGRADE PYELOGRAM, URETEROSCOPY AND STENT PLACEMENT Left 06/27/2019   Procedure: CYSTOSCOPY WITH LEFT RETROGRADE PYELOGRAM, URETEROSCOPY, BIOPSY AND LEFT STENT PLACEMENT;  Surgeon: Irine Seal, MD;  Location: [facility];  Service: Urology;  Laterality: Left;  . CYSTOSCOPY WITH RETROGRADE PYELOGRAM, URETEROSCOPY AND STENT PLACEMENT Left 08/18/2019   Procedure: CYSTOSCOPY, URETEROSCOPY AND STENT EXCHANGE;  Surgeon: Franchot Gallo, MD;  Location: [facility];  Service: Urology;  Laterality: Left;  71 MINS  . kidney removed    . THULIUM LASER TURP (TRANSURETHRAL RESECTION OF PROSTATE) Left 08/18/2019   Procedure: THULIUM LASER ABLATION OF URETERAL TUMOR;  Surgeon: Franchot Gallo, MD;  Location: [facility];  Service: Urology;  Laterality: Left;     Current Meds  Medication Sig  . acetaminophen (TYLENOL) 500 MG tablet Take 500 mg by mouth every 6 (six) hours as needed (for pain.).  Marland Kitchen amLODipine (NORVASC) 5 MG tablet Take 1 tablet (5 mg total) by mouth daily.  . carvedilol (COREG) 6.25 MG tablet Take 1 tablet (6.25 mg total) by mouth 2 (two) times daily with a meal.  . cephALEXin (KEFLEX) 500 MG capsule Take 1 capsule (500 mg total) by  mouth 2 (two) times daily.  . hydrALAZINE (APRESOLINE) 50 MG tablet Take 1 tablet (50 mg total) by mouth every 8 (eight) hours.  . isosorbide mononitrate (IMDUR) 30 MG 24 hr tablet Take 1 tablet (30 mg total) by mouth daily.  . sertraline (ZOLOFT) 50 MG tablet Take 50 mg by mouth daily.  . simvastatin (ZOCOR) 20 MG tablet Take 20 mg by mouth daily.  Marland Kitchen spironolactone (ALDACTONE) 25 MG tablet Take 1 tablet (25 mg total) by mouth daily.  . traZODone (DESYREL) 50 MG tablet Take 100 mg by mouth at bedtime.  . [DISCONTINUED] Calcium 500 MG tablet Take 500 mg by mouth daily.  . [DISCONTINUED] cholecalciferol (VITAMIN D) 1000 UNITS tablet Take 1,000 Units by mouth daily.     Allergies:   Erythromycin and Lotensin [benazepril hcl]   Social History   Tobacco Use  . Smoking status: Former Smoker    Packs/day: 0.50    Years: 38.00    Pack years: 19.00    Types: Cigarettes    Quit date: 11/26/2003    Years since quitting: 15.9  . Smokeless tobacco: Never Used  Substance Use Topics  . Alcohol use: No    Alcohol/week: 0.0 standard drinks  . Drug use: No     Family Hx: The patient's family history includes Diabetes Mellitus II in her sister; Hypertension in her brother, mother, and sister.  ROS:   Please see the history of present illness.     All other systems reviewed and are negative.   Prior CV studies:   The following studies were reviewed today:    Labs/Other Tests and Data Reviewed:    EKG:  No ECG reviewed.  Recent Labs: 06/25/2019: B Natriuretic Peptide 159.7 06/29/2019: ALT 42 07/01/2019: Magnesium 1.8 08/12/2019: BUN 17; Creatinine, Ser 1.22; Hemoglobin 12.5; Platelets 299; Potassium 4.4; Sodium 138   Recent Lipid Panel Lab Results  Component Value Date/Time   CHOL 150 07/01/2019 04:43 AM   TRIG 117 07/01/2019 04:43 AM   HDL 47 07/01/2019 04:43 AM   CHOLHDL 3.2 07/01/2019 04:43 AM   LDLCALC 80 07/01/2019 04:43 AM    Wt Readings from Last 3 Encounters:  11/03/19  166 lb (75.3 kg)  10/23/19 170 lb 3.2 oz (77.2 kg)  08/12/19 170 lb 14.4 oz (77.5 kg)     Objective:    Vital Signs:  BP (!) 114/51   Pulse 68   Ht 5\' 3"  (1.6 m)   Wt 166 lb (75.3 kg)   BMI 29.41 kg/m      ASSESSMENT & PLAN:    1. Chest pain :   Having CP below her left breast and on left side of chest .   We did not do a heart cath   She now complains of left chest pain and pain under her left breast . Will proceed with R and L heart cath.  The pains can last for hours .  Not related to exertion , eating or  drinking  While the pains are not classic for angina,  She will need a cath for further evaluation of these CP and for further work up of her CHF  Her renal function has improved.  Her last creatinine is 1.29.    I have discussed risks / benefits / options.   She understands and agrees to proceed   COVID-19 Education: The signs and symptoms of COVID-19 were discussed with the patient and how to seek care for testing (follow up with PCP or arrange E-visit).  The importance of social distancing was discussed today.  Time:   Today, I have spent  19  minutes with the patient with telehealth technology discussing the above problems.     Medication Adjustments/Labs and Tests Ordered: Current medicines are reviewed at length with the patient today.  Concerns regarding medicines are outlined above.   Tests Ordered: Orders Placed This Encounter  Procedures  . CBC  . Basic Metabolic Panel (BMET)    Medication Changes: Meds ordered this encounter  Medications  . aspirin EC 81 MG tablet    Sig: Take 1 tablet (81 mg total) by mouth daily.    Dispense:       Follow Up:  Either In Person or Virtual in 3 month(s)  Signed, Mertie Moores, MD  11/03/2019 4:51 PM    [place]

## 2019-11-04 ENCOUNTER — Other Ambulatory Visit: Payer: Medicare PPO

## 2019-11-04 ENCOUNTER — Ambulatory Visit (INDEPENDENT_AMBULATORY_CARE_PROVIDER_SITE_OTHER): Payer: Medicare PPO

## 2019-11-04 ENCOUNTER — Other Ambulatory Visit: Payer: Self-pay | Admitting: Cardiovascular Disease

## 2019-11-04 ENCOUNTER — Other Ambulatory Visit: Payer: Self-pay

## 2019-11-04 ENCOUNTER — Telehealth: Payer: Self-pay | Admitting: *Deleted

## 2019-11-04 ENCOUNTER — Other Ambulatory Visit (HOSPITAL_COMMUNITY)
Admission: RE | Admit: 2019-11-04 | Discharge: 2019-11-04 | Disposition: A | Discharge status: Home / Self Care | Payer: Medicare PPO | Source: Ambulatory Visit | Attending: Interventional Cardiology | Admitting: Interventional Cardiology

## 2019-11-04 VITALS — BP 100/48 | HR 63 | Ht 63.0 in | Wt 169.0 lb

## 2019-11-04 DIAGNOSIS — Z20822 Contact with and (suspected) exposure to covid-19: Secondary | ICD-10-CM | POA: Insufficient documentation

## 2019-11-04 DIAGNOSIS — Z01812 Encounter for preprocedural laboratory examination: Secondary | ICD-10-CM | POA: Insufficient documentation

## 2019-11-04 DIAGNOSIS — I5043 Acute on chronic combined systolic (congestive) and diastolic (congestive) heart failure: Secondary | ICD-10-CM

## 2019-11-04 DIAGNOSIS — I209 Angina pectoris, unspecified: Secondary | ICD-10-CM

## 2019-11-04 DIAGNOSIS — I5042 Chronic combined systolic (congestive) and diastolic (congestive) heart failure: Secondary | ICD-10-CM

## 2019-11-04 DIAGNOSIS — R0789 Other chest pain: Secondary | ICD-10-CM | POA: Diagnosis not present

## 2019-11-04 DIAGNOSIS — I10 Essential (primary) hypertension: Secondary | ICD-10-CM

## 2019-11-04 DIAGNOSIS — Z5181 Encounter for therapeutic drug level monitoring: Secondary | ICD-10-CM | POA: Diagnosis not present

## 2019-11-04 LAB — BASIC METABOLIC PANEL
BUN/Creatinine Ratio: 13 (ref 12–28)
BUN: 17 mg/dL (ref 8–27)
CO2: 24 mmol/L (ref 20–29)
Calcium: 10 mg/dL (ref 8.7–10.3)
Chloride: 100 mmol/L (ref 96–106)
Creatinine, Ser: 1.31 mg/dL — ABNORMAL HIGH (ref 0.57–1.00)
GFR calc Af Amer: 47 mL/min/{1.73_m2} — ABNORMAL LOW (ref >59)
GFR calc non Af Amer: 41 mL/min/{1.73_m2} — ABNORMAL LOW (ref >59)
Glucose: 99 mg/dL (ref 65–99)
Potassium: 4.4 mmol/L (ref 3.5–5.2)
Sodium: 138 mmol/L (ref 134–144)

## 2019-11-04 LAB — CBC
Hematocrit: 39.5 % (ref 34.0–46.6)
Hemoglobin: 13.1 g/dL (ref 11.1–15.9)
MCH: 30.1 pg (ref 26.6–33.0)
MCHC: 33.2 g/dL (ref 31.5–35.7)
MCV: 91 fL (ref 79–97)
Platelets: 280 10*3/uL (ref 150–450)
RBC: 4.35 x10E6/uL (ref 3.77–5.28)
RDW: 13.1 % (ref 11.7–15.4)
WBC: 6.2 10*3/uL (ref 3.4–10.8)

## 2019-11-04 LAB — SARS CORONAVIRUS 2 (TAT 6-24 HRS): SARS Coronavirus 2: NEGATIVE

## 2019-11-04 NOTE — H&P (View-Only) (Signed)
Pt is set up for R and L heart cath with possible PCI with Dr. Tamala Julian on Thursday      Mertie Moores, MD  11/04/2019 3:40 PM    [address],  [place], [place]  [address] Phone: [phone]; Fax: [phone]

## 2019-11-04 NOTE — Telephone Encounter (Addendum)
Pt contacted pre-catheterization scheduled at [facility] for: Thursday November 06, 2019 9 AM Verified arrival time and place: [facility]) at: 7 AM-this is a time change from original arrival time.   No solid food after midnight prior to cath, clear liquids until 5 AM day of procedure. Contrast allergy: no  Hold: Spironolactone -AM of procedure   Except hold medications AM meds can be  taken pre-cath with sip of water including: ASA 81 mg   Confirmed patient has responsible adult to drive home post procedure and observe 24 hours after arriving home: yes  Currently, due to Covid-19 pandemic, only one person will be allowed with patient. Must be the same person for patient's entire stay and will be required to wear a mask. They will be asked to wait in the waiting room for the duration of the patient's stay.  Patients are required to wear a mask when they enter the hospital.     COVID-19 Pre-Screening Questions:  . In the past 7 to 10 days have you had a cough,  shortness of breath, headache, congestion, fever (100 or greater) body aches, chills, sore throat, or sudden loss of taste or sense of smell? no  I reviewed procedure,mask, visitor instructions with patient, she verbalized understanding, thanked me for call.

## 2019-11-04 NOTE — Patient Instructions (Addendum)
EKG for  heart cath scheduled for 11/06/2019 with Dr H.Smith.    EKG ordered by Dr Acie Fredrickson   Pt seen by [facility] 11/03/2019 via telehealth visit.  Pt complaining of left sided chest pain under left breast.  Pt denies current CP or SOB.  Reports feeling well today.  EKG reviewed and signed by Dr H.Smith.  States normal.  Advised pt to follow instructions for heart cath as given to her by M.Swinyer, RN and Dr Acie Fredrickson.  Message routed to Dr Acie Fredrickson and Valeria Batman

## 2019-11-04 NOTE — Progress Notes (Signed)
Pt is set up for R and L heart cath with possible PCI with Dr. Tamala Julian on Thursday      Mertie Moores, MD  11/04/2019 3:40 PM    [address],  [place], [place]  [address] Phone: [phone]; Fax: [phone]

## 2019-11-05 ENCOUNTER — Telehealth: Payer: Self-pay | Admitting: Cardiovascular Disease

## 2019-11-05 NOTE — Telephone Encounter (Signed)
LMTCB for patient to let her know arrival time for cath 11/06/19 is the same 7 AM and that procedure time has been moved to 12 N since Dr Acie Fredrickson has recommended 4 hours hydration prior to cath.  This will be 5 hours prior to procedure time to allow 1 hour registration/ IV start, then 4 hours IV hydration prior to cath 12 N.

## 2019-11-05 NOTE — Telephone Encounter (Signed)
New Message ° ° ° °Pt is returning call  ° ° ° °Please call back  °

## 2019-11-05 NOTE — Telephone Encounter (Signed)
Pt is aware arrival time for cath 11/06/19 is 7 AM, procedure time is 12 noon. Pt instructed to hold spironolactone today and tomorrow-GFR 41

## 2019-11-05 NOTE — Telephone Encounter (Signed)
Per Dr Kerin Ransom copied from 11/04/19 BMP results:  Labs are ok for cath  She will need to arrive 4 hours prior to cath for IV hydration ____  I have made arrangements for 4 hours IV hydration prior to cath 11/06/19. Pt will arrive at 7 AM, cath time has been moved to 12 N.

## 2019-11-06 ENCOUNTER — Encounter (HOSPITAL_COMMUNITY)
Admission: RE | Disposition: A | Discharge status: Home / Self Care | Payer: Medicare PPO | Source: Home / Self Care | Attending: Interventional Cardiology

## 2019-11-06 ENCOUNTER — Ambulatory Visit (HOSPITAL_COMMUNITY)
Admission: RE | Admit: 2019-11-06 | Discharge: 2019-11-06 | Disposition: A | Discharge status: Home / Self Care | Payer: Medicare PPO | Attending: Interventional Cardiology | Admitting: Interventional Cardiology

## 2019-11-06 ENCOUNTER — Other Ambulatory Visit: Payer: Self-pay

## 2019-11-06 DIAGNOSIS — F329 Major depressive disorder, single episode, unspecified: Secondary | ICD-10-CM | POA: Diagnosis not present

## 2019-11-06 DIAGNOSIS — E782 Mixed hyperlipidemia: Secondary | ICD-10-CM | POA: Diagnosis present

## 2019-11-06 DIAGNOSIS — I2510 Atherosclerotic heart disease of native coronary artery without angina pectoris: Secondary | ICD-10-CM

## 2019-11-06 DIAGNOSIS — R079 Chest pain, unspecified: Secondary | ICD-10-CM | POA: Diagnosis not present

## 2019-11-06 DIAGNOSIS — I509 Heart failure, unspecified: Secondary | ICD-10-CM | POA: Diagnosis not present

## 2019-11-06 DIAGNOSIS — Z87891 Personal history of nicotine dependence: Secondary | ICD-10-CM | POA: Diagnosis not present

## 2019-11-06 DIAGNOSIS — F419 Anxiety disorder, unspecified: Secondary | ICD-10-CM | POA: Diagnosis not present

## 2019-11-06 DIAGNOSIS — Z79899 Other long term (current) drug therapy: Secondary | ICD-10-CM | POA: Insufficient documentation

## 2019-11-06 DIAGNOSIS — I10 Essential (primary) hypertension: Secondary | ICD-10-CM | POA: Diagnosis present

## 2019-11-06 DIAGNOSIS — I5043 Acute on chronic combined systolic (congestive) and diastolic (congestive) heart failure: Secondary | ICD-10-CM

## 2019-11-06 DIAGNOSIS — E785 Hyperlipidemia, unspecified: Secondary | ICD-10-CM | POA: Diagnosis not present

## 2019-11-06 DIAGNOSIS — I110 Hypertensive heart disease with heart failure: Secondary | ICD-10-CM | POA: Diagnosis not present

## 2019-11-06 HISTORY — PX: RIGHT/LEFT HEART CATH AND CORONARY ANGIOGRAPHY: CATH118266

## 2019-11-06 LAB — POCT I-STAT EG7
Acid-Base Excess: 2 mmol/L (ref 0.0–2.0)
Acid-Base Excess: 3 mmol/L — ABNORMAL HIGH (ref 0.0–2.0)
Bicarbonate: 28.3 mmol/L — ABNORMAL HIGH (ref 20.0–28.0)
Bicarbonate: 28.6 mmol/L — ABNORMAL HIGH (ref 20.0–28.0)
Calcium, Ion: 1.24 mmol/L (ref 1.15–1.40)
Calcium, Ion: 1.24 mmol/L (ref 1.15–1.40)
HCT: 35 % — ABNORMAL LOW (ref 36.0–46.0)
HCT: 35 % — ABNORMAL LOW (ref 36.0–46.0)
Hemoglobin: 11.9 g/dL — ABNORMAL LOW (ref 12.0–15.0)
Hemoglobin: 11.9 g/dL — ABNORMAL LOW (ref 12.0–15.0)
O2 Saturation: 75 %
O2 Saturation: 76 %
Potassium: 3.7 mmol/L (ref 3.5–5.1)
Potassium: 3.8 mmol/L (ref 3.5–5.1)
Sodium: 141 mmol/L (ref 135–145)
Sodium: 141 mmol/L (ref 135–145)
TCO2: 30 mmol/L (ref 22–32)
TCO2: 30 mmol/L (ref 22–32)
pCO2, Ven: 48.9 mmHg (ref 44.0–60.0)
pCO2, Ven: 49.8 mmHg (ref 44.0–60.0)
pH, Ven: 7.368 (ref 7.250–7.430)
pH, Ven: 7.37 (ref 7.250–7.430)
pO2, Ven: 42 mmHg (ref 32.0–45.0)
pO2, Ven: 43 mmHg (ref 32.0–45.0)

## 2019-11-06 LAB — POCT I-STAT 7, (LYTES, BLD GAS, ICA,H+H)
Acid-Base Excess: 1 mmol/L (ref 0.0–2.0)
Bicarbonate: 26.8 mmol/L (ref 20.0–28.0)
Calcium, Ion: 1.24 mmol/L (ref 1.15–1.40)
HCT: 35 % — ABNORMAL LOW (ref 36.0–46.0)
Hemoglobin: 11.9 g/dL — ABNORMAL LOW (ref 12.0–15.0)
O2 Saturation: 99 %
Potassium: 3.6 mmol/L (ref 3.5–5.1)
Sodium: 136 mmol/L (ref 135–145)
TCO2: 28 mmol/L (ref 22–32)
pCO2 arterial: 48.4 mmHg — ABNORMAL HIGH (ref 32.0–48.0)
pH, Arterial: 7.352 (ref 7.350–7.450)
pO2, Arterial: 124 mmHg — ABNORMAL HIGH (ref 83.0–108.0)

## 2019-11-06 SURGERY — RIGHT/LEFT HEART CATH AND CORONARY ANGIOGRAPHY
Anesthesia: LOCAL

## 2019-11-06 MED ORDER — SERTRALINE HCL 50 MG PO TABS: 50.0000 mg | ORAL_TABLET | Freq: Every day | ORAL | Status: DC

## 2019-11-06 MED ORDER — HEPARIN SODIUM (PORCINE) 1000 UNIT/ML IJ SOLN
INTRAMUSCULAR | Status: AC
  Filled 2019-11-06: qty 1

## 2019-11-06 MED ORDER — ONDANSETRON HCL 4 MG/2ML IJ SOLN: 4.0000 mg | Freq: Four times a day (QID) | INTRAMUSCULAR | Status: DC | PRN

## 2019-11-06 MED ORDER — VERAPAMIL HCL 2.5 MG/ML IV SOLN
INTRAVENOUS | Status: AC
  Filled 2019-11-06: qty 2

## 2019-11-06 MED ORDER — FENTANYL CITRATE (PF) 100 MCG/2ML IJ SOLN
INTRAMUSCULAR | Status: AC
  Filled 2019-11-06: qty 2

## 2019-11-06 MED ORDER — SODIUM CHLORIDE 0.9 % IV SOLN: INTRAVENOUS | Status: DC

## 2019-11-06 MED ORDER — MIDAZOLAM HCL 2 MG/2ML IJ SOLN
INTRAMUSCULAR | Status: AC
  Filled 2019-11-06: qty 2

## 2019-11-06 MED ORDER — SODIUM CHLORIDE 0.9% FLUSH: 3.0000 mL | Freq: Two times a day (BID) | INTRAVENOUS | Status: DC

## 2019-11-06 MED ORDER — ACETAMINOPHEN 500 MG PO TABS: 500.0000 mg | ORAL_TABLET | Freq: Four times a day (QID) | ORAL | Status: DC | PRN

## 2019-11-06 MED ORDER — SODIUM CHLORIDE 0.9% FLUSH: 3.0000 mL | INTRAVENOUS | Status: DC | PRN

## 2019-11-06 MED ORDER — HEPARIN (PORCINE) IN NACL 1000-0.9 UT/500ML-% IV SOLN
INTRAVENOUS | Status: AC
  Filled 2019-11-06: qty 1000

## 2019-11-06 MED ORDER — ASPIRIN EC 81 MG PO TBEC: 81.0000 mg | DELAYED_RELEASE_TABLET | Freq: Every day | ORAL | Status: DC

## 2019-11-06 MED ORDER — HEPARIN (PORCINE) IN NACL 1000-0.9 UT/500ML-% IV SOLN
INTRAVENOUS | Status: DC | PRN
  Administered 2019-11-06 (×2): 500 mL

## 2019-11-06 MED ORDER — IOHEXOL 350 MG/ML SOLN
INTRAVENOUS | Status: DC | PRN
  Administered 2019-11-06: 75 mL

## 2019-11-06 MED ORDER — OXYCODONE HCL 5 MG PO TABS: 5.0000 mg | ORAL_TABLET | ORAL | Status: DC | PRN

## 2019-11-06 MED ORDER — SODIUM CHLORIDE 0.9 % IV SOLN: 250.0000 mL | INTRAVENOUS | Status: DC | PRN

## 2019-11-06 MED ORDER — HYDRALAZINE HCL 20 MG/ML IJ SOLN: 10.0000 mg | INTRAMUSCULAR | Status: DC | PRN

## 2019-11-06 MED ORDER — FENTANYL CITRATE (PF) 100 MCG/2ML IJ SOLN
INTRAMUSCULAR | Status: DC | PRN
  Administered 2019-11-06: 25 ug via INTRAVENOUS

## 2019-11-06 MED ORDER — CALCIUM CARBONATE ANTACID 500 MG PO CHEW: 1.0000 | CHEWABLE_TABLET | Freq: Every day | ORAL | Status: DC

## 2019-11-06 MED ORDER — ACETAMINOPHEN 325 MG PO TABS: 650.0000 mg | ORAL_TABLET | ORAL | Status: DC | PRN

## 2019-11-06 MED ORDER — MIDAZOLAM HCL 2 MG/2ML IJ SOLN
INTRAMUSCULAR | Status: DC | PRN
  Administered 2019-11-06 (×2): 1 mg via INTRAVENOUS

## 2019-11-06 MED ORDER — SODIUM CHLORIDE 0.9 % WEIGHT BASED INFUSION: 1.0000 mL/kg/h | INTRAVENOUS | Status: DC

## 2019-11-06 MED ORDER — LABETALOL HCL 5 MG/ML IV SOLN: 10.0000 mg | INTRAVENOUS | Status: DC | PRN

## 2019-11-06 MED ORDER — ASPIRIN 81 MG PO CHEW: 81.0000 mg | CHEWABLE_TABLET | ORAL | Status: DC

## 2019-11-06 MED ORDER — LIDOCAINE HCL (PF) 1 % IJ SOLN
INTRAMUSCULAR | Status: AC
  Filled 2019-11-06: qty 30

## 2019-11-06 MED ORDER — HEPARIN SODIUM (PORCINE) 1000 UNIT/ML IJ SOLN
INTRAMUSCULAR | Status: DC | PRN
  Administered 2019-11-06: 3500 [IU] via INTRAVENOUS

## 2019-11-06 MED ORDER — CEPHALEXIN 500 MG PO CAPS: 500.0000 mg | ORAL_CAPSULE | Freq: Two times a day (BID) | ORAL | Status: DC

## 2019-11-06 MED ORDER — CARVEDILOL 3.125 MG PO TABS: 6.2500 mg | ORAL_TABLET | Freq: Two times a day (BID) | ORAL | Status: DC

## 2019-11-06 MED ORDER — AMLODIPINE BESYLATE 5 MG PO TABS: 5.0000 mg | ORAL_TABLET | Freq: Every day | ORAL | Status: DC

## 2019-11-06 MED ORDER — SIMVASTATIN 20 MG PO TABS: 20.0000 mg | ORAL_TABLET | Freq: Every day | ORAL | Status: DC

## 2019-11-06 MED ORDER — LIDOCAINE HCL (PF) 1 % IJ SOLN
INTRAMUSCULAR | Status: DC | PRN
  Administered 2019-11-06: 2 mL
  Administered 2019-11-06: 1 mL

## 2019-11-06 MED ORDER — TRAZODONE HCL 100 MG PO TABS: 100.0000 mg | ORAL_TABLET | Freq: Every day | ORAL | Status: DC

## 2019-11-06 MED ORDER — ISOSORBIDE MONONITRATE ER 30 MG PO TB24: 30.0000 mg | ORAL_TABLET | Freq: Every day | ORAL | Status: DC

## 2019-11-06 MED ORDER — VERAPAMIL HCL 2.5 MG/ML IV SOLN
INTRAVENOUS | Status: DC | PRN
  Administered 2019-11-06: 12:00:00 10 mL via INTRA_ARTERIAL

## 2019-11-06 MED ORDER — SODIUM CHLORIDE 0.9 % WEIGHT BASED INFUSION
3.0000 mL/kg/h | INTRAVENOUS | Status: AC
  Administered 2019-11-06: 3 mL/kg/h via INTRAVENOUS

## 2019-11-06 MED ORDER — SPIRONOLACTONE 25 MG PO TABS: 25.0000 mg | ORAL_TABLET | Freq: Every day | ORAL | Status: DC

## 2019-11-06 MED ORDER — HYDRALAZINE HCL 50 MG PO TABS: 50.0000 mg | ORAL_TABLET | Freq: Three times a day (TID) | ORAL | Status: DC

## 2019-11-06 MED ORDER — ASPIRIN 81 MG PO CHEW: 81.0000 mg | CHEWABLE_TABLET | Freq: Every day | ORAL | Status: DC

## 2019-11-06 SURGICAL SUPPLY — 13 items
CATH 5FR JL3.5 JR4 ANG PIG MP (CATHETERS) ×1 IMPLANT
CATH BALLN WEDGE 5F 110CM (CATHETERS) ×1 IMPLANT
DEVICE RAD COMP TR BAND LRG (VASCULAR PRODUCTS) ×1 IMPLANT
GLIDESHEATH SLEND A-KIT 6F 22G (SHEATH) ×1 IMPLANT
GUIDEWIRE INQWIRE 1.5J.035X260 (WIRE) IMPLANT
INQWIRE 1.5J .035X260CM (WIRE) ×2
KIT HEART LEFT (KITS) ×2 IMPLANT
PACK CARDIAC CATHETERIZATION (CUSTOM PROCEDURE TRAY) ×2 IMPLANT
SHEATH GLIDE SLENDER 4/5FR (SHEATH) ×1 IMPLANT
SHEATH PROBE COVER 6X72 (BAG) ×1 IMPLANT
TRANSDUCER W/STOPCOCK (MISCELLANEOUS) ×2 IMPLANT
TUBING CIL FLEX 10 FLL-RA (TUBING) ×2 IMPLANT
WIRE EMERALD 3MM-J .025X260CM (WIRE) ×1 IMPLANT

## 2019-11-06 NOTE — Research (Signed)
PHDE Informed Consent   Subject Name: [name]  Subject met inclusion and exclusion criteria.  The informed consent form, study requirements and expectations were reviewed with the subject and questions and concerns were addressed prior to the signing of the consent form.  The subject verbalized understanding of the trail requirements.  The subject agreed to participate in the PHDE trial and signed the informed consent.  The informed consent was obtained prior to performance of any protocol-specific procedures for the subject.  A copy of the signed informed consent was given to the subject and a copy was placed in the subject's medical record.  [name]. 11/06/2019, 10:28 AM

## 2019-11-06 NOTE — Interval H&P Note (Signed)
Cath Lab Visit (complete for each Cath Lab visit)  Clinical Evaluation Leading to the Procedure:   ACS: No.  Non-ACS:    Anginal Classification: CCS II  Anti-ischemic medical therapy: Minimal Therapy (1 class of medications)  Non-Invasive Test Results: No non-invasive testing performed  Prior CABG: No previous CABG      History and Physical Interval Note:  11/06/2019 11:52 AM  [name]  has presented today for surgery, with the diagnosis of heart failure - angina.  The various methods of treatment have been discussed with the patient and family. After consideration of risks, benefits and other options for treatment, the patient has consented to  Procedure(s): RIGHT/LEFT HEART CATH AND CORONARY ANGIOGRAPHY (N/A) as a surgical intervention.  The patient's history has been reviewed, patient examined, no change in status, stable for surgery.  I have reviewed the patient's chart and labs.  Questions were answered to the patient's satisfaction.     Belva Crome III

## 2019-11-06 NOTE — CV Procedure (Addendum)
   Right and left heart cath via right antecubital vein and right radial artery using real-time vascular ultrasound.  Normal right heart pressures.  Short, widely patent left main.  LAD gives origin to a large first diagonal and the LAD continues around the left ventricular apex.  The ostial to proximal LAD contains 40 to 50% narrowing.  The diagonal bifurcates in the proximal segment.  There is 50 to 60-80% stenosis beyond the bifurcation in the larger and 70% stenosis in the distal third of the diagonal.  The first obtuse marginal contains ostial eccentric 60% narrowing.  The circumflex is otherwise large.  The right coronary contains mid vessel eccentric 40% stenosis.  Left ventriculography by hand-injection reveals normal systolic contractility.  LVEDP is normal.  Impression: Moderately severe stenoses in mid segment of a moderate to large diagonal.  Moderate proximal LAD disease.  Moderate ostial circumflex disease.  Rest pain has occurred frequently and can last hours.  Current anatomy does not explain rest pain.  Recommend aggressive risk factor modification and up titration of anti-ischemic therapy.  The diagonal is diffusely diseased, can be treated, but would carry a slightly higher risk of complications due to branching and diffuse disease.

## 2019-11-06 NOTE — Discharge Instructions (Signed)
Radial Site Care  This sheet gives you information about how to care for yourself after your procedure. Your health care provider may also give you more specific instructions. If you have problems or questions, contact your health care provider. What can I expect after the procedure? After the procedure, it is common to have:  Bruising and tenderness at the catheter insertion area. Follow these instructions at home: Medicines  Take over-the-counter and prescription medicines only as told by your health care provider. Insertion site care  Follow instructions from your health care provider about how to take care of your insertion site. Make sure you: ? Wash your hands with soap and water before you change your bandage (dressing). If soap and water are not available, use hand sanitizer. ? Change your dressing as told by your health care provider. ? Leave stitches (sutures), skin glue, or adhesive strips in place. These skin closures may need to stay in place for 2 weeks or longer. If adhesive strip edges start to loosen and curl up, you may trim the loose edges. Do not remove adhesive strips completely unless your health care provider tells you to do that.  Check your insertion site every day for signs of infection. Check for: ? Redness, swelling, or pain. ? Fluid or blood. ? Pus or a bad smell. ? Warmth.  Do not take baths, swim, or use a hot tub until your health care provider approves.  You may shower 24-48 hours after the procedure, or as directed by your health care provider. ? Remove the dressing and gently wash the site with plain soap and water. ? Pat the area dry with a clean towel. ? Do not rub the site. That could cause bleeding.  Do not apply powder or lotion to the site. Activity   For 24 hours after the procedure, or as directed by your health care provider: ? Do not flex or bend the affected arm. ? Do not push or pull heavy objects with the affected arm. ? Do not  drive yourself home from the hospital or clinic. You may drive 24 hours after the procedure unless your health care provider tells you not to. ? Do not operate machinery or power tools.  Do not lift anything that is heavier than 10 lb (4.5 kg), or the limit that you are told, until your health care provider says that it is safe.  Ask your health care provider when it is okay to: ? Return to work or school. ? Resume usual physical activities or sports. ? Resume sexual activity. General instructions  If the catheter site starts to bleed, raise your arm and put firm pressure on the site. If the bleeding does not stop, get help right away. This is a medical emergency.  If you went home on the same day as your procedure, a responsible adult should be with you for the first 24 hours after you arrive home.  Keep all follow-up visits as told by your health care provider. This is important. Contact a health care provider if:  You have a fever.  You have redness, swelling, or yellow drainage around your insertion site. Get help right away if:  You have unusual pain at the radial site.  The catheter insertion area swells very fast.  The insertion area is bleeding, and the bleeding does not stop when you hold steady pressure on the area.  Your arm or hand becomes pale, cool, tingly, or numb. These symptoms may represent a serious problem   that is an emergency. Do not wait to see if the symptoms will go away. Get medical help right away. Call your local emergency services (911 in the [place] Do not drive yourself to the hospital. Summary  After the procedure, it is common to have bruising and tenderness at the site.  Follow instructions from your health care provider about how to take care of your radial site wound. Check the wound every day for signs of infection.  Do not lift anything that is heavier than 10 lb (4.5 kg), or the limit that you are told, until your health care provider says  that it is safe. This information is not intended to replace advice given to you by your health care provider. Make sure you discuss any questions you have with your health care provider. Document Revised: 10/31/2017 Document Reviewed: 10/31/2017 Elsevier Patient Education  2020 [employer]  

## 2019-11-07 ENCOUNTER — Telehealth: Payer: Self-pay | Admitting: Cardiovascular Disease

## 2019-11-07 NOTE — Telephone Encounter (Addendum)
Hi. Dr. Acie Fredrickson,  Patient has upcoming ureteroscopy and laser ablation of ureteral cancer planned (date TBD). I saw that you recently saw her for a telehealth visit on 11/03/2019 at which time she reported having some chest pain. She had a right/left heart catheterization yesterday which showed moderate CAD. Diagonal was relatively diffusely disease but Dr. Tamala Julian said it could be treated with PCI in the future if symptoms become Nitro response but did not real like there was any disease that accounted for patient's prolonged chest pain at rest at this time.   Is she OK to proceed with this procedure at this time? Also, is it OK for her to hold Aspirin for 5 days prior to this?  Please route response back to P CV DIV PREOP.  Thank you! Raela Bohl

## 2019-11-07 NOTE — Telephone Encounter (Signed)
   Primary Cardiologist: Mertie Moores, MD  Chart reviewed as part of pre-operative protocol coverage. Patient recently seen by Dr. Acie Fredrickson on 11/03/2019 at which time she reported some chest pain at rest. She underwent right/left yesterday which showed moderate CAD.   Per Dr. Acie Fredrickson: "She may hold her ASA for 5 days. She is at low risk from a CV standpoint."  I will route this recommendation to the requesting party via Epic fax function and remove from pre-op pool.  Please call with questions.  Darreld Mclean, PA-C 11/07/2019, 3:16 PM

## 2019-11-07 NOTE — Telephone Encounter (Signed)
   [facility] Pre-operative Risk Assessment    Request for surgical clearance:  1. What type of surgery is being performed? Ureteroscopy and Laser Ablation of ureteral cancer  2. When is this surgery scheduled? TBD  3. What type of clearance is required (medical clearance vs. Pharmacy clearance to hold med vs. Both)? Both  4. Are there any medications that need to be held prior to surgery and how long? Aspirin, 5 days prior to procedure  5. Practice name and name of physician performing surgery? [facility] -  Dr. Franchot Gallo  6. What is your office phone number? [phone] ext [phone]   7.   What is your office fax number? [phone]  8.   Anesthesia type (None, local, MAC, general) ? General   [name] 11/07/2019, 10:47 AM  _________________________________________________________________   (provider comments below)

## 2019-11-07 NOTE — Telephone Encounter (Signed)
  I have reviewed her angiograms.   She has moderate CAD in small - moderate sized branch vessels.   She may hold her ASA for 5 days She is at low risk from a CV standpoint

## 2019-11-10 ENCOUNTER — Other Ambulatory Visit: Payer: Self-pay | Admitting: Urology

## 2019-11-11 ENCOUNTER — Other Ambulatory Visit: Payer: Self-pay

## 2019-11-11 ENCOUNTER — Encounter (HOSPITAL_BASED_OUTPATIENT_CLINIC_OR_DEPARTMENT_OTHER): Payer: Self-pay | Admitting: Urology

## 2019-11-11 NOTE — Progress Notes (Addendum)
Spoke w/ via phone for pre-op interview---Shaunita Lab needs dos----      I stat 8         Lab results------ COVID test ------11-13-2019 Arrive at -------700 am 11-17-2019 NPO after ------midnight Medications to take morning of surgery -----isosorbide mononitrate, sertraline, simvastatin, hydrazaline, carvedilol, amlodipine Diabetic medication -----n/a Patient Special Instructions ----- Pre-Op special Istructions ----- Patient verbalized understanding of instructions that were given at this phone interview. Patient denies shortness of breath, chest pain, fever, cough a this phone interview.  Anesthesia: moderate cad, chf addendum: ok to proceed per Janett Billow zanetto pa  PCP: dr [name] Cardiologist : dr Cathie Olden, [facility] Clement 11-03-2019 chart/epic, cardiac clearance note callie goodrich pa 11-07-2019 with instructions to stop 81 mg aspirin per dr Jeronimo Norma chart/epic Chest x-ray :none EKG :11-04-2019 Stress test 06-30-2019 chart/epic Cardiac Cath : 11-06-2019 chart/epic Sleep Study/ CPAP :n/a Fasting Blood Sugar :      / Checks Blood Sugar -- times a day: n/a  Blood Thinner/ Instructions /Last Dose:n/a ASA / Instructions/ Last Dose : stop 81 mg aspirin x 5 days per dr Cathie Olden   Patient denies shortness of breath, chest pain, fever, and cough at this phone interview.

## 2019-11-13 ENCOUNTER — Other Ambulatory Visit (HOSPITAL_COMMUNITY)
Admission: RE | Admit: 2019-11-13 | Discharge: 2019-11-13 | Disposition: A | Discharge status: Home / Self Care | Payer: Medicare PPO | Source: Ambulatory Visit | Attending: Urology | Admitting: Urology

## 2019-11-13 DIAGNOSIS — Z20822 Contact with and (suspected) exposure to covid-19: Secondary | ICD-10-CM | POA: Diagnosis not present

## 2019-11-13 DIAGNOSIS — Z01812 Encounter for preprocedural laboratory examination: Secondary | ICD-10-CM | POA: Diagnosis not present

## 2019-11-13 LAB — SARS CORONAVIRUS 2 (TAT 6-24 HRS): SARS Coronavirus 2: NEGATIVE

## 2019-11-13 NOTE — Progress Notes (Signed)
Anesthesia Chart Review   Case: [id number] Date/Time: 11/17/19 0845   Procedures:      CYSTOSCOPY WITH RETROGRADE PYELOGRAM, URETEROSCOPY AND STENT PLACEMENT (Left ) - 90 MINS     HOLMIUM LASER APPLICATION (Left )     CYSTOSCOPY WITH STENT REPLACEMENT (Left )   Anesthesia type: General   Pre-op diagnosis: LEFT URETERAL CANCER   Location: [facility] OR ROOM 2 / [place]   Surgeons: Franchot Gallo, MD      DISCUSSION:74 y.o. former smoker (19 pack years, quit 11/26/03) with h/o HTN, HLD, CHF, CKD, left ureteral cancer scheduled for above procedure 11/17/2019 with Dr. Franchot Gallo.   Per cardiologist, Dr. Mertie Moores, 11/07/19, "I have reviewed her angiograms.   She has moderate CAD in small - moderate sized branch vessels.  She may hold her ASA for 5 days She is at low risk from a CV standpoint"  Anticipate pt can proceed with planned procedure barring acute status change.   VS: Ht 5\' 3"  (1.6 m)   Wt 76.7 kg   BMI 29.94 kg/m   PROVIDERS: Koirala, Dibas, MD is PCP   Nahser, Arnette Norris, MD is Cardiologist  LABS: labs DOS (all labs ordered are listed, but only abnormal results are displayed)  Labs Reviewed - No data to display   IMAGES:   EKG: 11/04/2019 Rate 63 bpm Normal SR  CV: Cardiac Cath 11/06/2019   Normal right heart pressures.  The left main is short, and widely patent.  Ostial to proximal LAD diffuse 40 to 50% narrowing. A moderate to large diagonal #1 contains 40 to 50% proximal segmental narrowing, mid 75% narrowing after a bifurcation, and more distal 60% narrowing.  The circumflex gives 3 obtuse marginal branches. The first moderate-sized obtuse marginal contains ostial eccentric 50 to 60% narrowing.  The RCA contains eccentric 30 to 40% mid narrowing. No significant obstruction is noted otherwise.  Normal left ventricular systolic function. EF greater than 50%. LVEDP is normal.  RECOMMENDATIONS:   The patient has moderate coronary  disease, especially involving the first diagonal. The diagonal is relatively diffusely diseased but could be treated with PCI although it perhaps a slightly increased risk of complications due to the diffuse nature of disease. There is no disease that would account for prolonged pain chest pain at rest. If symptoms become nitroglycerin responsive consider diagonal PCI if refractory and impacting quality of life.  Aggressive risk factor modification  Echo 06/28/2019 IMPRESSIONS    1. Left ventricular ejection fraction, by visual estimation, is 40-45%  with diffuse hypokinesis. The left ventricle has normal function. Normal  left ventricular size. There is moderately increased left ventricular  hypertrophy.  2. Elevated left ventricular end-diastolic pressure.  3. Left ventricular diastolic Doppler parameters are consistent with  impaired relaxation pattern of LV diastolic filling.  4. Global right ventricle has normal systolic function.The right  ventricular size is normal. No increase in right ventricular wall  thickness.  5. Left atrial size was mildly dilated.  6. Right atrial size was normal.  7. Trivial pericardial effusion is present.  8. The mitral valve is normal in structure. Moderate mitral valve  regurgitation. No evidence of mitral stenosis.  9. The tricuspid valve is normal in structure. Tricuspid valve  regurgitation is mild.  10. The aortic valve is normal in structure. Aortic valve regurgitation  was not visualized by color flow Doppler. Mild aortic valve sclerosis  without stenosis.  11. The pulmonic valve was normal in structure. Pulmonic valve  regurgitation is mild by color flow Doppler.  12. The inferior vena cava is normal in size with greater than 50%  respiratory variability, suggesting right atrial pressure of 3 mmHg.  13. Normal pulmonary artery systolic pressure.  Past Medical History:  Diagnosis Date  . Anxiety   . Cancer [facility])    left ureteral  cancer dx sept 2020  . CHF (congestive heart failure) ([place])   . Chronic kidney disease 06/2019   acute renal insufficiency  . Depression   . History of kidney cancer   . Hyperlipidemia   . Hypertension     Past Surgical History:  Procedure Laterality Date  . ABDOMINAL HYSTERECTOMY     partial  . bladder-partial removal    . CYSTOSCOPY WITH RETROGRADE PYELOGRAM, URETEROSCOPY AND STENT PLACEMENT Left 06/27/2019   Procedure: CYSTOSCOPY WITH LEFT RETROGRADE PYELOGRAM, URETEROSCOPY, BIOPSY AND LEFT STENT PLACEMENT;  Surgeon: Irine Seal, MD;  Location: [facility] ORS;  Service: Urology;  Laterality: Left;  . CYSTOSCOPY WITH RETROGRADE PYELOGRAM, URETEROSCOPY AND STENT PLACEMENT Left 08/18/2019   Procedure: CYSTOSCOPY, URETEROSCOPY AND STENT EXCHANGE;  Surgeon: Franchot Gallo, MD;  Location: [facility] ORS;  Service: Urology;  Laterality: Left;  109 MINS  . kidney removed     right   . RIGHT/LEFT HEART CATH AND CORONARY ANGIOGRAPHY N/A 11/06/2019   Procedure: RIGHT/LEFT HEART CATH AND CORONARY ANGIOGRAPHY;  Surgeon: Belva Crome, MD;  Location: [place] CV LAB;  Service: Cardiovascular;  Laterality: N/A;  . THULIUM LASER TURP (TRANSURETHRAL RESECTION OF PROSTATE) Left 08/18/2019   Procedure: THULIUM LASER ABLATION OF URETERAL TUMOR;  Surgeon: Franchot Gallo, MD;  Location: [facility] ORS;  Service: Urology;  Laterality: Left;    MEDICATIONS: No current facility-administered medications for this encounter.   Marland Kitchen acetaminophen (TYLENOL) 500 MG tablet  . amLODipine (NORVASC) 5 MG tablet  . aspirin EC 81 MG tablet  . calcium carbonate (TUMS - DOSED IN MG ELEMENTAL CALCIUM) 500 MG chewable tablet  . carvedilol (COREG) 6.25 MG tablet  . cephALEXin (KEFLEX) 500 MG capsule  . hydrALAZINE (APRESOLINE) 50 MG tablet  . isosorbide mononitrate (IMDUR) 30 MG 24 hr tablet  . sertraline (ZOLOFT) 50 MG tablet  . simvastatin (ZOCOR) 20 MG tablet  . spironolactone (ALDACTONE) 25 MG tablet  . traZODone (DESYREL) 50 MG  tablet    Maia Plan [facility] Pre-Surgical Testing [phone] 11/13/19  1:10 PM

## 2019-11-17 ENCOUNTER — Other Ambulatory Visit: Payer: Self-pay

## 2019-11-17 ENCOUNTER — Ambulatory Visit (HOSPITAL_BASED_OUTPATIENT_CLINIC_OR_DEPARTMENT_OTHER): Payer: Medicare PPO | Admitting: Physician Assistant

## 2019-11-17 ENCOUNTER — Ambulatory Visit (HOSPITAL_BASED_OUTPATIENT_CLINIC_OR_DEPARTMENT_OTHER)
Admission: RE | Admit: 2019-11-17 | Discharge: 2019-11-17 | Disposition: A | Discharge status: Home / Self Care | Payer: Medicare PPO | Attending: Urology | Admitting: Urology

## 2019-11-17 ENCOUNTER — Encounter (HOSPITAL_BASED_OUTPATIENT_CLINIC_OR_DEPARTMENT_OTHER)
Admission: RE | Disposition: A | Discharge status: Home / Self Care | Payer: Self-pay | Source: Home / Self Care | Attending: Urology

## 2019-11-17 ENCOUNTER — Encounter (HOSPITAL_BASED_OUTPATIENT_CLINIC_OR_DEPARTMENT_OTHER): Payer: Self-pay | Admitting: Urology

## 2019-11-17 DIAGNOSIS — I5043 Acute on chronic combined systolic (congestive) and diastolic (congestive) heart failure: Secondary | ICD-10-CM | POA: Diagnosis not present

## 2019-11-17 DIAGNOSIS — F329 Major depressive disorder, single episode, unspecified: Secondary | ICD-10-CM | POA: Diagnosis not present

## 2019-11-17 DIAGNOSIS — Z79899 Other long term (current) drug therapy: Secondary | ICD-10-CM | POA: Insufficient documentation

## 2019-11-17 DIAGNOSIS — N189 Chronic kidney disease, unspecified: Secondary | ICD-10-CM | POA: Diagnosis not present

## 2019-11-17 DIAGNOSIS — Z7982 Long term (current) use of aspirin: Secondary | ICD-10-CM | POA: Insufficient documentation

## 2019-11-17 DIAGNOSIS — Z87891 Personal history of nicotine dependence: Secondary | ICD-10-CM | POA: Diagnosis not present

## 2019-11-17 DIAGNOSIS — E785 Hyperlipidemia, unspecified: Secondary | ICD-10-CM | POA: Diagnosis not present

## 2019-11-17 DIAGNOSIS — F419 Anxiety disorder, unspecified: Secondary | ICD-10-CM | POA: Diagnosis not present

## 2019-11-17 DIAGNOSIS — C662 Malignant neoplasm of left ureter: Secondary | ICD-10-CM | POA: Diagnosis not present

## 2019-11-17 DIAGNOSIS — Z9079 Acquired absence of other genital organ(s): Secondary | ICD-10-CM | POA: Insufficient documentation

## 2019-11-17 DIAGNOSIS — I110 Hypertensive heart disease with heart failure: Secondary | ICD-10-CM | POA: Diagnosis not present

## 2019-11-17 DIAGNOSIS — I2510 Atherosclerotic heart disease of native coronary artery without angina pectoris: Secondary | ICD-10-CM | POA: Diagnosis not present

## 2019-11-17 DIAGNOSIS — Z85528 Personal history of other malignant neoplasm of kidney: Secondary | ICD-10-CM | POA: Diagnosis not present

## 2019-11-17 DIAGNOSIS — I509 Heart failure, unspecified: Secondary | ICD-10-CM | POA: Insufficient documentation

## 2019-11-17 DIAGNOSIS — I130 Hypertensive heart and chronic kidney disease with heart failure and stage 1 through stage 4 chronic kidney disease, or unspecified chronic kidney disease: Secondary | ICD-10-CM | POA: Diagnosis not present

## 2019-11-17 HISTORY — PX: THULIUM LASER TURP (TRANSURETHRAL RESECTION OF PROSTATE): SHX6744

## 2019-11-17 HISTORY — PX: CYSTOSCOPY W/ URETERAL STENT PLACEMENT: SHX1429

## 2019-11-17 HISTORY — PX: CYSTOSCOPY WITH RETROGRADE PYELOGRAM, URETEROSCOPY AND STENT PLACEMENT: SHX5789

## 2019-11-17 HISTORY — DX: Heart failure, unspecified: I50.9

## 2019-11-17 LAB — POCT I-STAT, CHEM 8
BUN: 20 mg/dL (ref 8–23)
Calcium, Ion: 1.25 mmol/L (ref 1.15–1.40)
Chloride: 100 mmol/L (ref 98–111)
Creatinine, Ser: 1.3 mg/dL — ABNORMAL HIGH (ref 0.44–1.00)
Glucose, Bld: 100 mg/dL — ABNORMAL HIGH (ref 70–99)
HCT: 41 % (ref 36.0–46.0)
Hemoglobin: 13.9 g/dL (ref 12.0–15.0)
Potassium: 3.7 mmol/L (ref 3.5–5.1)
Sodium: 141 mmol/L (ref 135–145)
TCO2: 32 mmol/L (ref 22–32)

## 2019-11-17 SURGERY — CYSTOURETEROSCOPY, WITH RETROGRADE PYELOGRAM AND STENT INSERTION
Anesthesia: General | Site: Ureter | Laterality: Left

## 2019-11-17 MED ORDER — FENTANYL CITRATE (PF) 100 MCG/2ML IJ SOLN
INTRAMUSCULAR | Status: AC
  Filled 2019-11-17: qty 2

## 2019-11-17 MED ORDER — IOHEXOL 300 MG/ML  SOLN
INTRAMUSCULAR | Status: DC | PRN
  Administered 2019-11-17: 20 mL via URETHRAL

## 2019-11-17 MED ORDER — DEXAMETHASONE SODIUM PHOSPHATE 10 MG/ML IJ SOLN
INTRAMUSCULAR | Status: AC
  Filled 2019-11-17: qty 1

## 2019-11-17 MED ORDER — PROMETHAZINE HCL 25 MG/ML IJ SOLN
6.2500 mg | INTRAMUSCULAR | Status: DC | PRN
  Filled 2019-11-17: qty 1

## 2019-11-17 MED ORDER — OXYCODONE HCL 5 MG/5ML PO SOLN
5.0000 mg | Freq: Once | ORAL | Status: DC | PRN
  Filled 2019-11-17: qty 5

## 2019-11-17 MED ORDER — LIDOCAINE HCL (CARDIAC) PF 100 MG/5ML IV SOSY
PREFILLED_SYRINGE | INTRAVENOUS | Status: DC | PRN
  Administered 2019-11-17: 60 mg via INTRAVENOUS

## 2019-11-17 MED ORDER — EPHEDRINE 5 MG/ML INJ
INTRAVENOUS | Status: AC
  Filled 2019-11-17: qty 10

## 2019-11-17 MED ORDER — DEXAMETHASONE SODIUM PHOSPHATE 4 MG/ML IJ SOLN
INTRAMUSCULAR | Status: DC | PRN
  Administered 2019-11-17: 5 mg via INTRAVENOUS

## 2019-11-17 MED ORDER — EPHEDRINE SULFATE 50 MG/ML IJ SOLN
INTRAMUSCULAR | Status: DC | PRN
  Administered 2019-11-17 (×2): 20 mg via INTRAVENOUS

## 2019-11-17 MED ORDER — GLYCOPYRROLATE 0.2 MG/ML IJ SOLN
INTRAMUSCULAR | Status: DC | PRN
  Administered 2019-11-17: .2 mg via INTRAVENOUS

## 2019-11-17 MED ORDER — ONDANSETRON HCL 4 MG/2ML IJ SOLN
INTRAMUSCULAR | Status: DC | PRN
  Administered 2019-11-17: 4 mg via INTRAVENOUS

## 2019-11-17 MED ORDER — SODIUM CHLORIDE 0.9 % IV SOLN
INTRAVENOUS | Status: DC
  Filled 2019-11-17: qty 1000

## 2019-11-17 MED ORDER — PROPOFOL 10 MG/ML IV BOLUS
INTRAVENOUS | Status: AC
  Filled 2019-11-17: qty 20

## 2019-11-17 MED ORDER — SODIUM CHLORIDE 0.9 % IR SOLN
Status: DC | PRN
  Administered 2019-11-17: 3000 mL

## 2019-11-17 MED ORDER — CEFAZOLIN SODIUM-DEXTROSE 2-4 GM/100ML-% IV SOLN
INTRAVENOUS | Status: AC
  Filled 2019-11-17: qty 100

## 2019-11-17 MED ORDER — LIDOCAINE 2% (20 MG/ML) 5 ML SYRINGE
INTRAMUSCULAR | Status: AC
  Filled 2019-11-17: qty 5

## 2019-11-17 MED ORDER — GLYCOPYRROLATE PF 0.2 MG/ML IJ SOSY
PREFILLED_SYRINGE | INTRAMUSCULAR | Status: AC
  Filled 2019-11-17: qty 1

## 2019-11-17 MED ORDER — HYDROMORPHONE HCL 1 MG/ML IJ SOLN
0.2500 mg | INTRAMUSCULAR | Status: DC | PRN
  Filled 2019-11-17: qty 0.5

## 2019-11-17 MED ORDER — PROPOFOL 10 MG/ML IV BOLUS
INTRAVENOUS | Status: DC | PRN
  Administered 2019-11-17: 150 mg via INTRAVENOUS

## 2019-11-17 MED ORDER — CEFAZOLIN SODIUM-DEXTROSE 2-4 GM/100ML-% IV SOLN
2.0000 g | INTRAVENOUS | Status: AC
  Administered 2019-11-17: 09:00:00 2 g via INTRAVENOUS
  Filled 2019-11-17: qty 100

## 2019-11-17 MED ORDER — ONDANSETRON HCL 4 MG/2ML IJ SOLN
INTRAMUSCULAR | Status: AC
  Filled 2019-11-17: qty 2

## 2019-11-17 MED ORDER — FENTANYL CITRATE (PF) 100 MCG/2ML IJ SOLN
INTRAMUSCULAR | Status: DC | PRN
  Administered 2019-11-17 (×4): 25 ug via INTRAVENOUS

## 2019-11-17 MED ORDER — OXYCODONE HCL 5 MG PO TABS
5.0000 mg | ORAL_TABLET | Freq: Once | ORAL | Status: DC | PRN
  Filled 2019-11-17: qty 1

## 2019-11-17 SURGICAL SUPPLY — 20 items
BAG DRAIN URO-CYSTO SKYTR STRL (DRAIN) ×4 IMPLANT
CATH INTERMIT  6FR 70CM (CATHETERS) ×4 IMPLANT
CLOTH BEACON ORANGE TIMEOUT ST (SAFETY) ×4 IMPLANT
GLOVE BIO SURGEON STRL SZ 6.5 (GLOVE) ×6 IMPLANT
GLOVE BIO SURGEON STRL SZ8 (GLOVE) ×4 IMPLANT
GLOVE BIO SURGEONS STRL SZ 6.5 (GLOVE) ×2
GOWN STRL REUS W/ TWL XL LVL3 (GOWN DISPOSABLE) ×2 IMPLANT
GOWN STRL REUS W/TWL LRG LVL3 (GOWN DISPOSABLE) ×4 IMPLANT
GOWN STRL REUS W/TWL XL LVL3 (GOWN DISPOSABLE) ×2
GUIDEWIRE STR DUAL SENSOR (WIRE) ×4 IMPLANT
IV NS IRRIG 3000ML ARTHROMATIC (IV SOLUTION) ×4 IMPLANT
KIT TURNOVER CYSTO (KITS) ×4 IMPLANT
LASER REVOLIX PROCEDURE (MISCELLANEOUS) ×4 IMPLANT
MANIFOLD NEPTUNE II (INSTRUMENTS) ×4 IMPLANT
NS IRRIG 500ML POUR BTL (IV SOLUTION) ×4 IMPLANT
PACK CYSTO (CUSTOM PROCEDURE TRAY) ×4 IMPLANT
STENT URET 6FRX24 CONTOUR (STENTS) ×4 IMPLANT
TUBE CONNECTING 12'X1/4 (SUCTIONS) ×1
TUBE CONNECTING 12X1/4 (SUCTIONS) ×3 IMPLANT
TUBING UROLOGY SET (TUBING) ×4 IMPLANT

## 2019-11-17 NOTE — Transfer of Care (Signed)
Immediate Anesthesia Transfer of Care Note  Patient: [name] CURRENT  Procedure(s) Performed: Procedure(s) (LRB): CYSTOSCOPY WITH RETROGRADE PYELOGRAM, URETEROSCOPY AND STENT PLACEMENT (Left) CYSTOSCOPY WITH STENT REPLACEMENT (Left) THULIUM LASER of URETERAL CANCER (Left)  Patient Location: PACU  Anesthesia Type: General  Level of Consciousness: awake, sedated, patient cooperative and responds to stimulation  Airway & Oxygen Therapy: Patient Spontanous Breathing and Patient connected to [place] 02 and soft FM  Post-op Assessment: Report given to PACU RN, Post -op Vital signs reviewed and stable and Patient moving all extremities  Post vital signs: Reviewed and stable  Complications: No apparent anesthesia complications

## 2019-11-17 NOTE — Interval H&P Note (Signed)
History and Physical Interval Note:  11/17/2019 8:59 AM  [name]  has presented today for surgery, with the diagnosis of LEFT URETERAL CANCER.  The various methods of treatment have been discussed with the patient and family. After consideration of risks, benefits and other options for treatment, the patient has consented to  Procedure(s) with comments: CYSTOSCOPY WITH RETROGRADE PYELOGRAM, URETEROSCOPY AND STENT PLACEMENT (Left) - 90 MINS HOLMIUM LASER APPLICATION (Left) CYSTOSCOPY WITH STENT REPLACEMENT (Left) as a surgical intervention.  The patient's history has been reviewed, patient examined, no change in status, stable for surgery.  I have reviewed the patient's chart and labs.  Questions were answered to the patient's satisfaction.     Lillette Boxer Kinan Safley

## 2019-11-17 NOTE — Anesthesia Preprocedure Evaluation (Signed)
Anesthesia Evaluation  Patient identified by MRN, date of birth, ID band Patient awake    Reviewed: Allergy & Precautions, H&P , NPO status , Patient's Chart, lab work & pertinent test results, reviewed documented beta blocker date and time   Airway Mallampati: II  TM Distance: >3 FB Neck ROM: Full    Dental no notable dental hx. (+) Teeth Intact, Dental Advisory Given   Pulmonary neg pulmonary ROS, former smoker,    Pulmonary exam normal breath sounds clear to auscultation       Cardiovascular Exercise Tolerance: Good hypertension, Pt. on medications and Pt. on home beta blockers + CAD   Rhythm:Regular Rate:Normal     Neuro/Psych Anxiety Depression negative neurological ROS     GI/Hepatic negative GI ROS, Neg liver ROS,   Endo/Other  negative endocrine ROS  Renal/GU Renal InsufficiencyRenal disease  negative genitourinary   Musculoskeletal   Abdominal   Peds  Hematology negative hematology ROS (+)   Anesthesia Other Findings   Reproductive/Obstetrics negative OB ROS                             Anesthesia Physical  Anesthesia Plan  ASA: III  Anesthesia Plan: General   Post-op Pain Management:    Induction: Intravenous  PONV Risk Score and Plan: 3 and Ondansetron, Dexamethasone and Midazolam  Airway Management Planned: LMA  Additional Equipment:   Intra-op Plan:   Post-operative Plan: Extubation in OR  Informed Consent: I have reviewed the patients History and Physical, chart, labs and discussed the procedure including the risks, benefits and alternatives for the proposed anesthesia with the patient or authorized representative who has indicated his/her understanding and acceptance.     Dental advisory given  Plan Discussed with: CRNA  Anesthesia Plan Comments: (See PAT note 08/12/2019, Konrad Felix, PA-C)        Anesthesia Quick Evaluation

## 2019-11-17 NOTE — OR Nursing (Signed)
Left ureteral stent was removed by Dr. Dahlstedt 

## 2019-11-17 NOTE — Anesthesia Procedure Notes (Signed)
Procedure Name: LMA Insertion Date/Time: 11/17/2019 9:12 AM Performed by: Justice Rocher, CRNA Pre-anesthesia Checklist: Patient identified, Emergency Drugs available, Suction available and Patient being monitored Patient Re-evaluated:Patient Re-evaluated prior to induction Oxygen Delivery Method: Circle system utilized Preoxygenation: Pre-oxygenation with 100% oxygen Induction Type: IV induction Ventilation: Mask ventilation without difficulty LMA: LMA inserted LMA Size: 4.0 Number of attempts: 1 Airway Equipment and Method: Bite block Placement Confirmation: positive ETCO2 and breath sounds checked- equal and bilateral Tube secured with: Tape Dental Injury: Teeth and Oropharynx as per pre-operative assessment

## 2019-11-17 NOTE — H&P (Signed)
H&P  Chief Complaint: Urothelial carcinoma in left ureter  History of Present Illness: [name] is a 74 y.o. year old female who presents at this time for ureteroscopic management of recurrent urothelial carcinoma, diagnosed in late 2020, in her left ureter.  She has a history over 10 years ago of right nephro ureterectomy for right   Distal ureteral urothelial carcinoma.  She presented several months ago with acute renal insufficiency and hydronephrosis of her left renal unit.  Thereafter she was discovered to have this urothelial carcinoma.  She underwent urgent stenting, followed by  A a ureteroscopic management with thulim laser in November.  She still has a stent.  She presents for repeat resection.  Past Medical History:  Diagnosis Date  . Anxiety   . Cancer [facility])    left ureteral cancer dx sept 2020  . CHF (congestive heart failure) ([place])   . Chronic kidney disease 06/2019   acute renal insufficiency  . Depression   . History of kidney cancer   . Hyperlipidemia   . Hypertension     Past Surgical History:  Procedure Laterality Date  . ABDOMINAL HYSTERECTOMY     partial  . bladder-partial removal    . CYSTOSCOPY WITH RETROGRADE PYELOGRAM, URETEROSCOPY AND STENT PLACEMENT Left 06/27/2019   Procedure: CYSTOSCOPY WITH LEFT RETROGRADE PYELOGRAM, URETEROSCOPY, BIOPSY AND LEFT STENT PLACEMENT;  Surgeon: Irine Seal, MD;  Location: [facility];  Service: Urology;  Laterality: Left;  . CYSTOSCOPY WITH RETROGRADE PYELOGRAM, URETEROSCOPY AND STENT PLACEMENT Left 08/18/2019   Procedure: CYSTOSCOPY, URETEROSCOPY AND STENT EXCHANGE;  Surgeon: Franchot Gallo, MD;  Location: [facility];  Service: Urology;  Laterality: Left;  59 MINS  . kidney removed     right   . RIGHT/LEFT HEART CATH AND CORONARY ANGIOGRAPHY N/A 11/06/2019   Procedure: RIGHT/LEFT HEART CATH AND CORONARY ANGIOGRAPHY;  Surgeon: Belva Crome, MD;  Location: [place] [facility];  Service: Cardiovascular;  Laterality: N/A;  .  THULIUM LASER TURP (TRANSURETHRAL RESECTION OF PROSTATE) Left 08/18/2019   Procedure: THULIUM LASER ABLATION OF URETERAL TUMOR;  Surgeon: Franchot Gallo, MD;  Location: [facility];  Service: Urology;  Laterality: Left;    Home Medications:  Medications Prior to Admission  Medication Sig Dispense Refill  . acetaminophen (TYLENOL) 500 MG tablet Take 500 mg by mouth every 6 (six) hours as needed (for pain.).    Marland Kitchen calcium carbonate (TUMS - DOSED IN MG ELEMENTAL CALCIUM) 500 MG chewable tablet Chew 1 tablet by mouth daily.    . carvedilol (COREG) 6.25 MG tablet Take 1 tablet (6.25 mg total) by mouth 2 (two) times daily with a meal. 180 tablet 3  . hydrALAZINE (APRESOLINE) 50 MG tablet Take 1 tablet (50 mg total) by mouth every 8 (eight) hours. 270 tablet 3  . isosorbide mononitrate (IMDUR) 30 MG 24 hr tablet Take 1 tablet (30 mg total) by mouth daily. 90 tablet 3  . sertraline (ZOLOFT) 50 MG tablet Take 50 mg by mouth daily.    . simvastatin (ZOCOR) 20 MG tablet Take 20 mg by mouth daily.    Marland Kitchen spironolactone (ALDACTONE) 25 MG tablet Take 1 tablet (25 mg total) by mouth daily. 90 tablet 3  . traZODone (DESYREL) 50 MG tablet Take 100 mg by mouth at bedtime.    Marland Kitchen amLODipine (NORVASC) 5 MG tablet Take 1 tablet (5 mg total) by mouth daily. 90 tablet 3  . aspirin EC 81 MG tablet Take 1 tablet (81 mg total) by mouth daily.    Marland Kitchen  cephALEXin (KEFLEX) 500 MG capsule Take 1 capsule (500 mg total) by mouth 2 (two) times daily. 6 capsule 0    Allergies:  Allergies  Allergen Reactions  . Erythromycin Diarrhea and Nausea And Vomiting  . Lotensin [Benazepril Hcl] Cough    Family History  Problem Relation Age of Onset  . Hypertension Mother   . Diabetes Mellitus II Sister   . Hypertension Sister   . Hypertension Brother     Social History:  reports that she quit smoking about 15 years ago. Her smoking use included cigarettes. She has a 19.00 pack-year smoking history. She has never used smokeless  tobacco. She reports that she does not drink alcohol or use drugs.  ROS: A complete review of systems was performed.  All systems are negative except for pertinent findings as noted.  Physical Exam:  Vital signs in last 24 hours: Temp:  [97.8 F (36.6 C)] 97.8 F (36.6 C) (02/08 0724) Pulse Rate:  [63] 63 (02/08 0724) Resp:  [16] 16 (02/08 0724) BP: (129)/(58) 129/58 (02/08 0724) SpO2:  [96 %] 96 % (02/08 0724) Weight:  [76.4 kg] 76.4 kg (02/08 0724) General:  Alert and oriented, No acute distress HEENT: Normocephalic, atraumatic Neck: No JVD or lymphadenopathy Cardiovascular: Regular rate and rhythm Lungs: Clear bilaterally Abdomen: Soft, nontender, nondistended, no abdominal masses Back: No CVA tenderness Extremities: No edema Neurologic: Grossly intact  Laboratory Data:  Results for orders placed or performed during the hospital encounter of 11/17/19 (from the past 24 hour(s))  I-STAT, chem 8     Status: Abnormal   Collection Time: 11/17/19  7:51 AM  Result Value Ref Range   Sodium 141 135 - 145 mmol/L   Potassium 3.7 3.5 - 5.1 mmol/L   Chloride 100 98 - 111 mmol/L   BUN 20 8 - 23 mg/dL   Creatinine, Ser 1.30 (H) 0.44 - 1.00 mg/dL   Glucose, Bld 100 (H) 70 - 99 mg/dL   Calcium, Ion 1.25 1.15 - 1.40 mmol/L   TCO2 32 22 - 32 mmol/L   Hemoglobin 13.9 12.0 - 15.0 g/dL   HCT 41.0 36.0 - 46.0 %   Recent Results (from the past 240 hour(s))  SARS CORONAVIRUS 2 (TAT 6-24 HRS) Nasopharyngeal Nasopharyngeal Swab     Status: None   Collection Time: 11/13/19  8:26 AM   Specimen: Nasopharyngeal Swab  Result Value Ref Range Status   SARS Coronavirus 2 NEGATIVE NEGATIVE Final    Comment: (NOTE) SARS-CoV-2 target nucleic acids are NOT DETECTED. The SARS-CoV-2 RNA is generally detectable in upper and lower respiratory specimens during the acute phase of infection. Negative results do not preclude SARS-CoV-2 infection, do not rule out co-infections with other pathogens, and should  not be used as the sole basis for treatment or other patient management decisions. Negative results must be combined with clinical observations, patient history, and epidemiological information. The expected result is Negative. Fact Sheet for Patients: SugarRoll.be Fact Sheet for Healthcare Providers: https://www.woods-mathews.com/ This test is not yet approved or cleared by the [place] FDA and  has been authorized for detection and/or diagnosis of SARS-CoV-2 by FDA under an Emergency Use Authorization (EUA). This EUA will remain  in effect (meaning this test can be used) for the duration of the COVID-19 declaration under Section 56 4(b)(1) of the Act, 21 U.S.C. section 360bbb-3(b)(1), unless the authorization is terminated or revoked sooner. Performed at [facility], [place] [address], [place], [place] [address]    Creatinine: Recent Labs  11/17/19 0751  CREATININE 1.30*    Radiologic Imaging: No results found.  Impression/Assessment:    Urothelial carcinoma of left distal ureter  Plan:   cystoscopy, left double-J stent extraction, ureteroscopic management of left urothelial carcinoma with thulium laser  Lillette Boxer Cana Mignano 11/17/2019, 8:45 AM  Lillette Boxer. Dailey Buccheri MD

## 2019-11-17 NOTE — Op Note (Signed)
Preoperative diagnosis: History of urothelial carcinoma of the left distal ureter, status post ureteroscopic ablation of prior tumor with filmy laser  Postoperative diagnosis: Same, small areas of persistence/recurrence  Principal procedure: Cystoscopy, left double-J stent extraction, left retrograde ureteropyelogram with fluoroscopic interpretation, left ureteroscopy, thulium laser ablation of small areas of persistent/recurrence, placement of 6 French by 24 cm contour double-J stent without tether  Surgeon: Xai Frerking  Anesthesia: General with LMA  Specimen: None  Drains: None  Estimated blood loss: None  Indications: 74 year old female approximately 10 years out from right nephro ureterectomy and TURBT of right distal ureteral and bladder carcinomas.  Pathology at that time revealed urothelial carcinoma.  She has had no evidence of recurrence until the fall 2020 when she presented with acute renal insufficiency as well as hydronephrosis.  Evaluation revealed the patient to have urothelial carcinoma in the left distal ureter.  She underwent urgent stenting with biopsy followed by endoscopic management of these recurrences with thulium laser.  She has maintained with a stent and has had normal renal function and no significant stent symptoms.  She presents at this time for repeat ureteroscopy and treatment with thulium laser for any recurrences.  I discussed the procedure with the patient.  Additionally, risks and complications have been discussed.  I also discussed possible BCG treatment down the road, following this process.  She understands the procedure and desires to proceed.  Findings: Systematic inspection of the bladder revealed no urothelial lesions.  There was no right ureteral orifice.  Stent was present at the left ureteral orifice.  Following stent removal retrograde ureteropyelogram was performed.  This revealed a normal caliber ureter without evidence of hydronephrosis or filling  defects.  Pyelocalyceal system was normal.  Except for a couple of tiny bubbles that moved, there was no evidence of filling defects.  Ureteroscopically there were tiny papillary lesions anteriorly approximately 2 cm distal to the left ureteropelvic junction.  The upper pole calyceal system could be examined with the ureteroscope and no lesions were seen.  In the distal ureter, there was mild urothelial irregularity and minimal narrowing approximately 6 cm and then again approximately 4 cm from the ureterovesical junction.  No discrete papillary lesions were noted but the urothelium was somewhat raised.  Description of procedure: The patient was properly identified and marked in the holding area.  She was taken to the operating room where general anesthetic was administered with the LMA.  2 g of Ancef was administered intravenously.  Timeout was performed following sterile prep and drape with the patient in the lithotomy position.  A 21 French panendoscope was advanced into the bladder.  Systematic inspection was performed with the above-mentioned findings.  The stent was grasped and extracted.  Following this retrograde study was performed using Omnipaque on the left side with the above-mentioned findings.  Following retrograde study, sensor tip guidewire was advanced into the upper pole calyceal system.  The cystoscope was removed and exchanged for a semirigid short dual-lumen ureteroscope.  I then accessed the ureter and the entire ureter was inspected.  There was minimal narrowing of the ureter approximately 4 and then approximately 6 cm from the ureterovesical junction.  Scope was easily passed into the proximal ureter and then the upper pole calyx.  The most proximal urothelial lesion was perhaps 1 to 2 mm in size.  They were to the side-by-side.  These were then ablated using the thulium laser with the energy level set at 12 W.  The 200 m fiber was  utilized for treatment.  Scope was then brought down to  the more distal ureter where the more proximal lesion was seen medially on the ureteral wall.  Again, the lesion was treated with the laser set at 48 W.  Only the medial wall was treated, taking great care to avoid circumferential ablation.  At this point, no urothelial abnormalities were seen on this more proximal lesion.  The scope was then brought down a couple of more centimeters where the last lesion was seen.  This was based on the posterior ureteral wall.  Treatment was then performed with ablation performed in a superficial manner.  At this point, no suspicious lesions were remaining within the ureter.  The scope was then removed.  No biopsies/specimens were taken.  The guidewire was then easily placed in the left ureter using the cystoscope.  Once adequately positioned fluoroscopically, a 6 [place] by 24 cm contour double-J stent with tether removed was deployed in the ureter with excellent proximal and distal curl seen using fluoroscopy and cystoscopy, respectively.  At this point, the bladder was drained.  The scope was removed and the procedure terminated.  The patient was then awakened, taken to the PACU in stable condition.  She tolerated the procedure well.

## 2019-11-17 NOTE — Discharge Instructions (Signed)
1. You may see some blood in the urine and may have some burning with urination for 48-72 hours. You also may notice that you have to urinate more frequently or urgently after your procedure which is normal.  2. You should call should you develop an inability urinate, fever > 101, persistent nausea and vomiting that prevents you from eating or drinking to stay hydrated.  3. If you have a stent, you will likely urinate more frequently and urgently until the stent is removed and you may experience some discomfort/pain in the lower abdomen and flank especially when urinating. You may take pain medication prescribed to you if needed for pain. You may also intermittently have blood in the urine until the stent is removed.   [facility] [phone] Post Ureteroscopy With or Without Stent Instructions  Definitions:  Ureter: The duct that transports urine from the kidney to the bladder. Stent:   A plastic hollow tube that is placed into the ureter, from the kidney to the  bladder to prevent the ureter from swelling shut.  GENERAL INSTRUCTIONS:  Despite the fact that no skin incisions were used, the area around the ureter and bladder is raw and irritated. The stent is a foreign body which will further irritate the bladder wall. This irritation is manifested by increased frequency of urination, both day and night, and by an increase in the urge to urinate. In some, the urge to urinate is present almost always. Sometimes the urge is strong enough that you may not be able to stop yourself from urinating. The only real cure is to remove the stent and then give time for the bladder wall to heal which can't be done until the danger of the ureter swelling shut has passed, which varies.  You may see some blood in your urine while the stent is in place and a few days afterwards. Do not be alarmed, even if the urine was clear for a while. Get off your feet and drink lots of fluids until clearing  occurs. If you start to pass clots or don't improve, call us.  DIET: You may return to your normal diet immediately. Because of the raw surface of your bladder, alcohol, spicy foods, acid type foods and drinks with caffeine may cause irritation or frequency and should be used in moderation. To keep your urine flowing freely and to avoid constipation, drink plenty of fluids during the day ( 8-10 glasses ). Tip: Avoid cranberry juice because it is very acidic.  ACTIVITY: Your physical activity doesn't need to be restricted. However, if you are very active, you may see some blood in your urine. We suggest that you reduce your activity under these circumstances until the bleeding has stopped.  BOWELS: It is important to keep your bowels regular during the postoperative period. Straining with bowel movements can cause bleeding. A bowel movement every other day is reasonable. Use a mild laxative if needed, such as Milk of Magnesia 2-3 tablespoons, or 2 Dulcolax tablets. Call if you continue to have problems. If you have been taking narcotics for pain, before, during or after your surgery, you may be constipated. Take a laxative if necessary.   MEDICATION: You should resume your pre-surgery medications unless told not to. In addition you will often be given an antibiotic to prevent infection. These should be taken as prescribed until the bottles are finished unless you are having an unusual reaction to one of the drugs.  PROBLEMS YOU SHOULD REPORT TO [place]:  Fevers over 100.5 Fahrenheit.  Heavy bleeding, or clots ( See above notes about blood in urine ).  Inability to urinate.  Drug reactions ( hives, rash, nausea, vomiting, diarrhea ).  Severe burning or pain with urination that is not improving.  FOLLOW-UP: You will need a follow-up appointment to monitor your progress. Call for this appointment at the number listed above. Usually the first appointment will be about three to fourteen days after  your surgery.      Post Anesthesia Home Care Instructions  Activity: Get plenty of rest for the remainder of the day. A responsible individual must stay with you for 24 hours following the procedure.  For the next 24 hours, DO NOT: -Drive a car -Paediatric nurse -Drink alcoholic beverages -Take any medication unless instructed by your physician -Make any legal decisions or sign important papers.  Meals: Start with liquid foods such as gelatin or soup. Progress to regular foods as tolerated. Avoid greasy, spicy, heavy foods. If nausea and/or vomiting occur, drink only clear liquids until the nausea and/or vomiting subsides. Call your physician if vomiting continues.  Special Instructions/Symptoms: Your throat may feel dry or sore from the anesthesia or the breathing tube placed in your throat during surgery. If this causes discomfort, gargle with warm salt water. The discomfort should disappear within 24 hours.  If you had a scopolamine patch placed behind your ear for the management of post- operative nausea and/or vomiting:  1. The medication in the patch is effective for 72 hours, after which it should be removed.  Wrap patch in a tissue and discard in the trash. Wash hands thoroughly with soap and water. 2. You may remove the patch earlier than 72 hours if you experience unpleasant side effects which may include dry mouth, dizziness or visual disturbances. 3. Avoid touching the patch. Wash your hands with soap and water after contact with the patch.

## 2019-11-17 NOTE — Anesthesia Postprocedure Evaluation (Signed)
Anesthesia Post Note  Patient: [name]  Procedure(s) Performed: CYSTOSCOPY WITH RETROGRADE PYELOGRAM, URETEROSCOPY AND STENT PLACEMENT (Left Ureter) CYSTOSCOPY WITH STENT REPLACEMENT (Left Ureter) THULIUM LASER of URETERAL CANCER (Left Ureter)     Patient location during evaluation: PACU Anesthesia Type: General Level of consciousness: awake and alert Pain management: pain level controlled Vital Signs Assessment: post-procedure vital signs reviewed and stable Respiratory status: spontaneous breathing, nonlabored ventilation and respiratory function stable Cardiovascular status: blood pressure returned to baseline and stable Postop Assessment: no apparent nausea or vomiting Anesthetic complications: no    Last Vitals:  Vitals:   11/17/19 1029 11/17/19 1030  BP:  (!) 111/57  Pulse:  70  Resp:  12  Temp:    SpO2: 92% 93%    Last Pain:  Vitals:   11/17/19 1115  TempSrc:   PainSc: 0-No pain                 Lynda Rainwater

## 2019-12-10 DIAGNOSIS — N3000 Acute cystitis without hematuria: Secondary | ICD-10-CM | POA: Diagnosis not present

## 2019-12-10 DIAGNOSIS — C678 Malignant neoplasm of overlapping sites of bladder: Secondary | ICD-10-CM | POA: Diagnosis not present

## 2019-12-10 DIAGNOSIS — C669 Malignant neoplasm of unspecified ureter: Secondary | ICD-10-CM | POA: Diagnosis not present

## 2019-12-11 DIAGNOSIS — D692 Other nonthrombocytopenic purpura: Secondary | ICD-10-CM | POA: Diagnosis not present

## 2019-12-11 DIAGNOSIS — L814 Other melanin hyperpigmentation: Secondary | ICD-10-CM | POA: Diagnosis not present

## 2019-12-11 DIAGNOSIS — D2261 Melanocytic nevi of right upper limb, including shoulder: Secondary | ICD-10-CM | POA: Diagnosis not present

## 2019-12-11 DIAGNOSIS — Z85828 Personal history of other malignant neoplasm of skin: Secondary | ICD-10-CM | POA: Diagnosis not present

## 2019-12-11 DIAGNOSIS — L918 Other hypertrophic disorders of the skin: Secondary | ICD-10-CM | POA: Diagnosis not present

## 2019-12-11 DIAGNOSIS — L820 Inflamed seborrheic keratosis: Secondary | ICD-10-CM | POA: Diagnosis not present

## 2019-12-11 DIAGNOSIS — L821 Other seborrheic keratosis: Secondary | ICD-10-CM | POA: Diagnosis not present

## 2019-12-11 DIAGNOSIS — D225 Melanocytic nevi of trunk: Secondary | ICD-10-CM | POA: Diagnosis not present

## 2019-12-11 DIAGNOSIS — D2262 Melanocytic nevi of left upper limb, including shoulder: Secondary | ICD-10-CM | POA: Diagnosis not present

## 2019-12-23 DIAGNOSIS — Z5111 Encounter for antineoplastic chemotherapy: Secondary | ICD-10-CM | POA: Diagnosis not present

## 2019-12-23 DIAGNOSIS — C678 Malignant neoplasm of overlapping sites of bladder: Secondary | ICD-10-CM | POA: Diagnosis not present

## 2019-12-30 DIAGNOSIS — C678 Malignant neoplasm of overlapping sites of bladder: Secondary | ICD-10-CM | POA: Diagnosis not present

## 2020-01-06 DIAGNOSIS — Z5111 Encounter for antineoplastic chemotherapy: Secondary | ICD-10-CM | POA: Diagnosis not present

## 2020-01-06 DIAGNOSIS — C678 Malignant neoplasm of overlapping sites of bladder: Secondary | ICD-10-CM | POA: Diagnosis not present

## 2020-01-13 DIAGNOSIS — Z5111 Encounter for antineoplastic chemotherapy: Secondary | ICD-10-CM | POA: Diagnosis not present

## 2020-01-13 DIAGNOSIS — C678 Malignant neoplasm of overlapping sites of bladder: Secondary | ICD-10-CM | POA: Diagnosis not present

## 2020-01-20 DIAGNOSIS — Z5111 Encounter for antineoplastic chemotherapy: Secondary | ICD-10-CM | POA: Diagnosis not present

## 2020-01-20 DIAGNOSIS — C678 Malignant neoplasm of overlapping sites of bladder: Secondary | ICD-10-CM | POA: Diagnosis not present

## 2020-01-22 ENCOUNTER — Inpatient Hospital Stay: Payer: Medicare PPO | Attending: Oncology | Admitting: Oncology

## 2020-01-22 ENCOUNTER — Other Ambulatory Visit: Payer: Self-pay

## 2020-01-22 VITALS — BP 109/51 | HR 70 | Temp 98.2°F | Resp 18 | Ht 63.0 in | Wt 173.9 lb

## 2020-01-22 DIAGNOSIS — C679 Malignant neoplasm of bladder, unspecified: Secondary | ICD-10-CM | POA: Insufficient documentation

## 2020-01-22 DIAGNOSIS — Z955 Presence of coronary angioplasty implant and graft: Secondary | ICD-10-CM | POA: Insufficient documentation

## 2020-01-22 NOTE — Progress Notes (Signed)
Hematology and Oncology Follow Up Visit  [name] [id number] 28-Jun-1946 74 y.o. 01/22/2020 10:10 AM Koirala, Dibas, MDKoirala, Dibas, MD   Principle Diagnosis: 74 year old woman with high-grade urothelial carcinoma of the distal left ureter diagnosed in September 2020.   Prior Therapy:  She is status post cystoscopy and a stent placement in September 2020 and repeated in February 2021.  She is status post right nephro ureterectomy for a distal ureteral carcinoma.  Current therapy: Intermittent cystoscopy and tumor ablation under the care of Dr. Diona Fanti.  Interim History: Ms. Masin returns today for a follow-up visit.  Since the last visit, she underwent repeat cystoscopy and a left double-J stent placement as well as laser ablation of small area of persistent tumor of the left distal ureter.  She tolerated the procedure well without any major complications.  She denies any nausea, vomiting or hematuria.  Reports small spotting of blood on 2 separate occasions but nothing persistent.     Medications: I have reviewed the patient's current medications.  Current Outpatient Medications  Medication Sig Dispense Refill  . acetaminophen (TYLENOL) 500 MG tablet Take 500 mg by mouth every 6 (six) hours as needed (for pain.).    Marland Kitchen amLODipine (NORVASC) 5 MG tablet Take 1 tablet (5 mg total) by mouth daily. 90 tablet 3  . aspirin EC 81 MG tablet Take 1 tablet (81 mg total) by mouth daily.    . calcium carbonate (TUMS - DOSED IN MG ELEMENTAL CALCIUM) 500 MG chewable tablet Chew 1 tablet by mouth daily.    . carvedilol (COREG) 6.25 MG tablet Take 1 tablet (6.25 mg total) by mouth 2 (two) times daily with a meal. 180 tablet 3  . cephALEXin (KEFLEX) 500 MG capsule Take 1 capsule (500 mg total) by mouth 2 (two) times daily. 6 capsule 0  . hydrALAZINE (APRESOLINE) 50 MG tablet Take 1 tablet (50 mg total) by mouth every 8 (eight) hours. 270 tablet 3  . isosorbide mononitrate (IMDUR) 30 MG 24  hr tablet Take 1 tablet (30 mg total) by mouth daily. 90 tablet 3  . sertraline (ZOLOFT) 50 MG tablet Take 50 mg by mouth daily.    . simvastatin (ZOCOR) 20 MG tablet Take 20 mg by mouth daily.    Marland Kitchen spironolactone (ALDACTONE) 25 MG tablet Take 1 tablet (25 mg total) by mouth daily. 90 tablet 3  . traZODone (DESYREL) 50 MG tablet Take 100 mg by mouth at bedtime.     No current facility-administered medications for this visit.     Allergies:  Allergies  Allergen Reactions  . Erythromycin Diarrhea and Nausea And Vomiting  . Lotensin [Benazepril Hcl] Cough      Physical Exam: Blood pressure (!) 109/51, pulse 70, temperature 98.2 F (36.8 C), temperature source Temporal, resp. rate 18, height 5\' 3"  (1.6 m), weight 173 lb 14.4 oz (78.9 kg), SpO2 94 %.   ECOG: 1    General appearance: Comfortable appearing without any discomfort Head: Normocephalic without any trauma Oropharynx: Mucous membranes are moist and pink without any thrush or ulcers. Eyes: Pupils are equal and round reactive to light. Lymph nodes: No cervical, supraclavicular, inguinal or axillary lymphadenopathy.   Heart:regular rate and rhythm.  S1 and S2 without leg edema. Lung: Clear without any rhonchi or wheezes.  No dullness to percussion. Abdomin: Soft, nontender, nondistended with good bowel sounds.  No hepatosplenomegaly. Musculoskeletal: No joint deformity or effusion.  Full range of motion noted. Neurological: No deficits noted on motor, sensory and  deep tendon reflex exam. Skin: No petechial rash or dryness.  Appeared moist.      Lab Results: Lab Results  Component Value Date   WBC 6.2 11/04/2019   HGB 13.9 11/17/2019   HCT 41.0 11/17/2019   MCV 91 11/04/2019   PLT 280 11/04/2019     Chemistry      Component Value Date/Time   NA 141 11/17/2019 0751   NA 138 11/04/2019 1058   K 3.7 11/17/2019 0751   CL 100 11/17/2019 0751   CO2 24 11/04/2019 1058   BUN 20 11/17/2019 0751   BUN 17 11/04/2019  1058   CREATININE 1.30 (H) 11/17/2019 0751      Component Value Date/Time   CALCIUM 10.0 11/04/2019 1058   ALKPHOS 49 06/29/2019 0544   AST 47 (H) 06/29/2019 0544   ALT 42 06/29/2019 0544   BILITOT 0.8 06/29/2019 0544       Impression and Plan: 74 year old woman with:  1.    Left distal ureteral high-grade urothelial carcinoma diagnosed in September 2020.    She is currently receiving a repeat cystoscopy and laser ablation under the care of Dr. Diona Fanti with stent currently in place to prevent hydronephrosis.  The natural course of this disease was reviewed again at this time and treatment options were reiterated.  Local therapy remains the preferred choice at this time with recurrent ablation and possible BCG treatment.  Systemic therapy with Pembrolizumab would be reserved if she develops recurrent refractory disease.  This was discussed today with her in detail and she understands and prefer this approach.  2.  Follow-up : No routine follow-up will be scheduled.  I will see her in the future as needed per Dr. Diona Fanti discretion she developed recurrent disease.   20  minutes was spent on this encounter.  The time was dedicated to reviewing the natural course of her disease, treatment options and complications of therapy.   Zola Button, MD 4/15/202110:10 AM

## 2020-01-26 DIAGNOSIS — B029 Zoster without complications: Secondary | ICD-10-CM | POA: Diagnosis not present

## 2020-01-27 DIAGNOSIS — Z5111 Encounter for antineoplastic chemotherapy: Secondary | ICD-10-CM | POA: Diagnosis not present

## 2020-01-27 DIAGNOSIS — C679 Malignant neoplasm of bladder, unspecified: Secondary | ICD-10-CM | POA: Diagnosis not present

## 2020-02-06 ENCOUNTER — Encounter: Payer: Self-pay | Admitting: Physician Assistant

## 2020-02-06 ENCOUNTER — Other Ambulatory Visit: Payer: Self-pay

## 2020-02-06 ENCOUNTER — Ambulatory Visit (INDEPENDENT_AMBULATORY_CARE_PROVIDER_SITE_OTHER): Payer: Medicare PPO | Admitting: Physician Assistant

## 2020-02-06 VITALS — BP 100/64 | HR 74 | Ht 63.0 in | Wt 175.0 lb

## 2020-02-06 DIAGNOSIS — I2510 Atherosclerotic heart disease of native coronary artery without angina pectoris: Secondary | ICD-10-CM

## 2020-02-06 DIAGNOSIS — I10 Essential (primary) hypertension: Secondary | ICD-10-CM | POA: Diagnosis not present

## 2020-02-06 DIAGNOSIS — E782 Mixed hyperlipidemia: Secondary | ICD-10-CM

## 2020-02-06 DIAGNOSIS — I5042 Chronic combined systolic (congestive) and diastolic (congestive) heart failure: Secondary | ICD-10-CM

## 2020-02-06 NOTE — Patient Instructions (Signed)
Medication Instructions:  Your physician recommends that you continue on your current medications as directed. Please refer to the Current Medication list given to you today.  *If you need a refill on your cardiac medications before your next appointment, please call your pharmacy*   Lab Work: None ordered  If you have labs (blood work) drawn today and your tests are completely normal, you will receive your results only by: . MyChart Message (if you have MyChart) OR . A paper copy in the mail If you have any lab test that is abnormal or we need to change your treatment, we will call you to review the results.   Testing/Procedures: None ordered   Follow-Up: At [facility], you and your health needs are our priority.  As part of our continuing mission to provide you with exceptional heart care, we have created designated Provider Care Teams.  These Care Teams include your primary Cardiologist (physician) and Advanced Practice Providers (APPs -  Physician Assistants and Nurse Practitioners) who all work together to provide you with the care you need, when you need it.  We recommend signing up for the patient portal called "MyChart".  Sign up information is provided on this After Visit Summary.  MyChart is used to connect with patients for Virtual Visits (Telemedicine).  Patients are able to view lab/test results, encounter notes, upcoming appointments, etc.  Non-urgent messages can be sent to your provider as well.   To learn more about what you can do with MyChart, go to [email or web address].    Your next appointment:   6 month(s)  The format for your next appointment:   In Person  Provider:   You may see Philip Nahser, MD or one of the following Advanced Practice Providers on your designated Care Team:    Scott Weaver, PA-C  Vin Bhagat, PA-C    Other Instructions   

## 2020-02-06 NOTE — Progress Notes (Signed)
Cardiology Office Note    Date:  02/06/2020   ID:  Heather, [name] [birth year], MRN [id number]  PCP:  [name], MD  Cardiologist: Dr. Acie Fredrickson  Chief Complaint: 60-month follow-up  History of Present Illness:   [name] is a 74 y.o. female history of moderate CAD (medically managed), chronic systolic heart failure, hypertension, hyperlipidemia and renal insufficiency presents for follow-up.  Admitted September 2020 with acute heart failure.  Echocardiogram with mildly reduced LV function to 40 to 45%.  Myoview was low risk.  He was last seen by Dr. Acie Fredrickson November 03, 2019 virtually.  He was having chest pain.  Left and right cardiac cath recommended.  Cardiac cath by Dr. Tamala Julian 11/06/19 showed diffuse mild to moderate disease especially in first diagonal.  No real culprit lesion and medical therapy recommended.  May consider PCI of diagonal if refractory angina and affecting life.  Patient is here for follow-up.  Currently recovering from shingles.  Denies chest pain, shortness of breath, orthopnea, PND, syncope, lower extremity edema or melena.  Past Medical History:  Diagnosis Date  . Anxiety   . CAD in native artery    a. cath 10/2019- mild to moderate dx >> medical therapy   . Cancer [facility])    left ureteral cancer dx sept 2020  . CHF (congestive heart failure) ([place])   . Chronic kidney disease 06/2019   acute renal insufficiency  . Depression   . History of kidney cancer   . Hyperlipidemia   . Hypertension     Past Surgical History:  Procedure Laterality Date  . ABDOMINAL HYSTERECTOMY     partial  . bladder-partial removal    . CYSTOSCOPY W/ URETERAL STENT PLACEMENT Left 11/17/2019   Procedure: CYSTOSCOPY WITH STENT REPLACEMENT;  Surgeon: Franchot Gallo, MD;  Location: [facility];  Service: Urology;  Laterality: Left;  . CYSTOSCOPY WITH RETROGRADE PYELOGRAM, URETEROSCOPY AND STENT PLACEMENT Left 06/27/2019   Procedure:  CYSTOSCOPY WITH LEFT RETROGRADE PYELOGRAM, URETEROSCOPY, BIOPSY AND LEFT STENT PLACEMENT;  Surgeon: Irine Seal, MD;  Location: [facility];  Service: Urology;  Laterality: Left;  . CYSTOSCOPY WITH RETROGRADE PYELOGRAM, URETEROSCOPY AND STENT PLACEMENT Left 08/18/2019   Procedure: CYSTOSCOPY, URETEROSCOPY AND STENT EXCHANGE;  Surgeon: Franchot Gallo, MD;  Location: [facility];  Service: Urology;  Laterality: Left;  90 MINS  . CYSTOSCOPY WITH RETROGRADE PYELOGRAM, URETEROSCOPY AND STENT PLACEMENT Left 11/17/2019   Procedure: CYSTOSCOPY WITH RETROGRADE PYELOGRAM, URETEROSCOPY AND STENT PLACEMENT;  Surgeon: Franchot Gallo, MD;  Location: [facility];  Service: Urology;  Laterality: Left;  81 MINS  . kidney removed     right   . RIGHT/LEFT HEART CATH AND CORONARY ANGIOGRAPHY N/A 11/06/2019   Procedure: RIGHT/LEFT HEART CATH AND CORONARY ANGIOGRAPHY;  Surgeon: Belva Crome, MD;  Location: [facility];  Service: Cardiovascular;  Laterality: N/A;  . THULIUM LASER TURP (TRANSURETHRAL RESECTION OF PROSTATE) Left 08/18/2019   Procedure: THULIUM LASER ABLATION OF URETERAL TUMOR;  Surgeon: Franchot Gallo, MD;  Location: [facility];  Service: Urology;  Laterality: Left;  . THULIUM LASER TURP (TRANSURETHRAL RESECTION OF PROSTATE) Left 11/17/2019   Procedure: THULIUM LASER of URETERAL CANCER;  Surgeon: Franchot Gallo, MD;  Location: [facility];  Service: Urology;  Laterality: Left;    Current Medications: Prior to Admission medications   Medication Sig Start Date End Date Taking? Authorizing Provider  acetaminophen (TYLENOL) 500 MG tablet Take 500 mg by mouth every 6 (six) hours as  needed (for pain.).    [provider]  amLODipine (NORVASC) 5 MG tablet Take 1 tablet (5 mg total) by mouth daily. 07/30/19 11/03/19  Leanor Kail, PA  aspirin EC 81 MG tablet Take 1 tablet (81 mg total) by mouth daily. 11/03/19   Nahser, Wonda Cheng, MD  calcium carbonate (TUMS -  DOSED IN MG ELEMENTAL CALCIUM) 500 MG chewable tablet Chew 1 tablet by mouth daily.    [provider]  carvedilol (COREG) 6.25 MG tablet Take 1 tablet (6.25 mg total) by mouth 2 (two) times daily with a meal. 09/02/19   Nahser, Wonda Cheng, MD  cephALEXin (KEFLEX) 500 MG capsule Take 1 capsule (500 mg total) by mouth 2 (two) times daily. 08/18/19   Franchot Gallo, MD  hydrALAZINE (APRESOLINE) 50 MG tablet Take 1 tablet (50 mg total) by mouth every 8 (eight) hours. 07/30/19 11/17/19  Leanor Kail, PA  isosorbide mononitrate (IMDUR) 30 MG 24 hr tablet Take 1 tablet (30 mg total) by mouth daily. 07/30/19 11/17/19  Leanor Kail, PA  sertraline (ZOLOFT) 50 MG tablet Take 50 mg by mouth daily.    [provider]  simvastatin (ZOCOR) 20 MG tablet Take 20 mg by mouth daily.    [provider]  spironolactone (ALDACTONE) 25 MG tablet Take 1 tablet (25 mg total) by mouth daily. 07/30/19 11/17/19  Leanor Kail, PA  traZODone (DESYREL) 50 MG tablet Take 100 mg by mouth at bedtime.    [provider]    Allergies:   Erythromycin and Lotensin [benazepril hcl]   Social History   Socioeconomic History  . Marital status: Married    Spouse name: Not on file  . Number of children: Not on file  . Years of education: Not on file  . Highest education level: Not on file  Occupational History  . Not on file  Tobacco Use  . Smoking status: Former Smoker    Packs/day: 0.50    Years: 38.00    Pack years: 19.00    Types: Cigarettes    Quit date: 11/26/2003    Years since quitting: 16.2  . Smokeless tobacco: Never Used  Substance and Sexual Activity  . Alcohol use: No    Alcohol/week: 0.0 standard drinks  . Drug use: No  . Sexual activity: Not on file  Other Topics Concern  . Not on file  Social History Narrative  . Not on file   Social Determinants of Health   Financial Resource Strain:   . Difficulty of Paying Living Expenses:   Food Insecurity:    . Worried About Charity fundraiser in the Last Year:   . Arboriculturist in the Last Year:   Transportation Needs:   . Film/video editor (Medical):   Marland Kitchen Lack of Transportation (Non-Medical):   Physical Activity:   . Days of Exercise per Week:   . Minutes of Exercise per Session:   Stress:   . Feeling of Stress :   Social Connections:   . Frequency of Communication with Friends and Family:   . Frequency of Social Gatherings with Friends and Family:   . Attends Religious Services:   . Active Member of Clubs or Organizations:   . Attends Archivist Meetings:   Marland Kitchen Marital Status:      Family History:  The patient's family history includes Diabetes Mellitus II in her sister; Hypertension in her brother, mother, and sister.   ROS:   Please see the history of  present illness.    ROS All other systems reviewed and are negative.   PHYSICAL EXAM:   VS:  BP 100/64   Pulse 74   Ht 5\' 3"  (1.6 m)   Wt 175 lb (79.4 kg)   SpO2 96%   BMI 31.00 kg/m    GEN: Well nourished, well developed, in no acute distress  HEENT: normal  Neck: no JVD, carotid bruits, or masses Cardiac: RRR; no murmurs, rubs, or gallops,no edema  Respiratory:  clear to auscultation bilaterally, normal work of breathing GI: soft, nontender, nondistended, + BS MS: no deformity or atrophy  Skin: warm and dry, no rash Neuro:  Alert and Oriented x 3, Strength and sensation are intact Psych: euthymic mood, full affect  Wt Readings from Last 3 Encounters:  02/06/20 175 lb (79.4 kg)  01/22/20 173 lb 14.4 oz (78.9 kg)  11/17/19 168 lb 6.4 oz (76.4 kg)      Studies/Labs Reviewed:   EKG:  EKG is not ordered today.   Recent Labs: 06/25/2019: B Natriuretic Peptide 159.7 06/29/2019: ALT 42 07/01/2019: Magnesium 1.8 11/04/2019: Platelets 280 11/17/2019: BUN 20; Creatinine, Ser 1.30; Hemoglobin 13.9; Potassium 3.7; Sodium 141   Lipid Panel    Component Value Date/Time   CHOL 150 07/01/2019 0443   TRIG 117  07/01/2019 0443   HDL 47 07/01/2019 0443   CHOLHDL 3.2 07/01/2019 0443   VLDL 23 07/01/2019 0443   LDLCALC 80 07/01/2019 0443    Additional studies/ records that were reviewed today include:  RIGHT/LEFT HEART CATH AND CORONARY ANGIOGRAPHY 11/06/19  Conclusion    Normal right heart pressures.  The left main is short, and widely patent.  Ostial to proximal LAD diffuse 40 to 50% narrowing. A moderate to large diagonal #1 contains 40 to 50% proximal segmental narrowing, mid 75% narrowing after a bifurcation, and more distal 60% narrowing.  The circumflex gives 3 obtuse marginal branches. The first moderate-sized obtuse marginal contains ostial eccentric 50 to 60% narrowing.  The RCA contains eccentric 30 to 40% mid narrowing. No significant obstruction is noted otherwise.  Normal left ventricular systolic function. EF greater than 50%. LVEDP is normal.  RECOMMENDATIONS:   The patient has moderate coronary disease, especially involving the first diagonal. The diagonal is relatively diffusely diseased but could be treated with PCI although it perhaps a slightly increased risk of complications due to the diffuse nature of disease. There is no disease that would account for prolonged pain chest pain at rest. If symptoms become nitroglycerin responsive consider diagonal PCI if refractory and impacting quality of life.  Aggressive risk factor modification   Diagnostic Dominance: Right     ASSESSMENT & PLAN:    1.  Chronic systolic heart failure -Euvolemic.  Continue Coreg, hydralazine, Imdur and spironolactone. -Not on ACE or ARB due to CKD.  2.  CAD -No angina -Continue aspirin, statin and beta-blocker  3.  Hypertension -Intermittent hypotension with dizziness -I have told her to cut back amlodipine to 2.5 mg and see response -Continue other medications at current dose  4. HLD - 07/01/2019: Cholesterol 150; HDL 47; LDL Cholesterol 80; Triglycerides 117; VLDL 23  -  Continue statin   Medication Adjustments/Labs and Tests Ordered: Current medicines are reviewed at length with the patient today.  Concerns regarding medicines are outlined above.  Medication changes, Labs and Tests ordered today are listed in the Patient Instructions below. Patient Instructions  Medication Instructions:  Your physician recommends that you continue on your current medications as directed.  Please refer to the Current Medication list given to you today.  *If you need a refill on your cardiac medications before your next appointment, please call your pharmacy*   Lab Work: None ordered  If you have labs (blood work) drawn today and your tests are completely normal, you will receive your results only by: Marland Kitchen MyChart Message (if you have MyChart) OR . A paper copy in the mail If you have any lab test that is abnormal or we need to change your treatment, we will call you to review the results.   Testing/Procedures: None ordered   Follow-Up: At [facility], you and your health needs are our priority.  As part of our continuing mission to provide you with exceptional heart care, we have created designated Provider Care Teams.  These Care Teams include your primary Cardiologist (physician) and Advanced Practice Providers (APPs -  Physician Assistants and Nurse Practitioners) who all work together to provide you with the care you need, when you need it.  We recommend signing up for the patient portal called "MyChart".  Sign up information is provided on this After Visit Summary.  MyChart is used to connect with patients for Virtual Visits (Telemedicine).  Patients are able to view lab/test results, encounter notes, upcoming appointments, etc.  Non-urgent messages can be sent to your provider as well.   To learn more about what you can do with MyChart, go to [email or web address].    Your next appointment:   6 month(s)  The format for your next appointment:   In Person   Provider:   You may see Mertie Moores, MD or one of the following Advanced Practice Providers on your designated Care Team:    Richardson Dopp, PA-C  Robbie Lis, PA-C    Other Instructions      Jarrett Soho, [place]  02/06/2020 12:19 PM    [address], [place], [place]  [address] Phone: [phone]; Fax: [phone]

## 2020-03-12 ENCOUNTER — Other Ambulatory Visit: Payer: Self-pay | Admitting: Family Medicine

## 2020-03-12 DIAGNOSIS — Z1231 Encounter for screening mammogram for malignant neoplasm of breast: Secondary | ICD-10-CM

## 2020-03-16 ENCOUNTER — Telehealth: Payer: Self-pay | Admitting: Cardiovascular Disease

## 2020-03-16 NOTE — Telephone Encounter (Signed)
[name] reports her BP has been lower than usual lately. A few times, she has felt "really weird" and thought she may faint. She has not actually fainted. Her BP has been running 90s-110/50s. Today, before taking her meds, her systolic was 026. She currently feels fine.  In April, she was instructed to decrease her amlodipine to 2.5 mg daily but she did not. She is taking 5 mg daily. Instructed her to STOP AMLODIPINE, continue to monitor symptoms and BP, and call next week for an update. She was grateful for call and agrees with treatment plan.

## 2020-03-16 NOTE — Telephone Encounter (Signed)
Pt c/o BP issue: STAT if pt c/o blurred vision, one-sided weakness or slurred speech  1. What are your last 5 BP readings?  No readings available  2. Are you having any other symptoms (ex. Dizziness, headache, blurred vision, passed out)? Weakness  3. What is your BP issue? Patient states for the past 3-4 days her BP has been extremely low.   STAT if patient feels like he/she is going to faint   1) Are you dizzy now? No  2) Do you feel faint or have you passed out? Patient states she has been feeling faint 3) Do you have any other symptoms? No  4) Have you checked your HR and BP (record if available)? No

## 2020-03-23 ENCOUNTER — Telehealth: Payer: Self-pay | Admitting: Cardiovascular Disease

## 2020-03-23 NOTE — Telephone Encounter (Signed)
New Message   Pt is calling to leave her BP readings for the nurse  06/06 120/76 hr 66  06/10 99/55 hr 84 06/11 111/65 hr 64 102/53 hr 66 119/71, 124/66, 114/72  Pt says she is feeling better    Please advise

## 2020-03-23 NOTE — Telephone Encounter (Signed)
Spoke to the patient who feels fine. She will continue to check her BP no more than once daily after taking her medications. She will call if symptoms occur or if BP is low. She was grateful for follow-up.

## 2020-03-24 DIAGNOSIS — Z8551 Personal history of malignant neoplasm of bladder: Secondary | ICD-10-CM | POA: Diagnosis not present

## 2020-03-24 DIAGNOSIS — C669 Malignant neoplasm of unspecified ureter: Secondary | ICD-10-CM | POA: Diagnosis not present

## 2020-03-29 ENCOUNTER — Other Ambulatory Visit: Payer: Self-pay | Admitting: Urology

## 2020-04-16 ENCOUNTER — Other Ambulatory Visit: Payer: Self-pay

## 2020-04-16 ENCOUNTER — Ambulatory Visit
Admission: RE | Admit: 2020-04-16 | Discharge: 2020-04-16 | Disposition: A | Discharge status: Home / Self Care | Payer: Medicare PPO | Source: Ambulatory Visit | Attending: Family Medicine | Admitting: Family Medicine

## 2020-04-16 DIAGNOSIS — Z1231 Encounter for screening mammogram for malignant neoplasm of breast: Secondary | ICD-10-CM

## 2020-04-26 DIAGNOSIS — Z8551 Personal history of malignant neoplasm of bladder: Secondary | ICD-10-CM | POA: Diagnosis not present

## 2020-04-26 DIAGNOSIS — R8279 Other abnormal findings on microbiological examination of urine: Secondary | ICD-10-CM | POA: Diagnosis not present

## 2020-04-30 DIAGNOSIS — E7800 Pure hypercholesterolemia, unspecified: Secondary | ICD-10-CM | POA: Diagnosis not present

## 2020-04-30 DIAGNOSIS — I10 Essential (primary) hypertension: Secondary | ICD-10-CM | POA: Diagnosis not present

## 2020-04-30 DIAGNOSIS — D692 Other nonthrombocytopenic purpura: Secondary | ICD-10-CM | POA: Diagnosis not present

## 2020-04-30 DIAGNOSIS — I5022 Chronic systolic (congestive) heart failure: Secondary | ICD-10-CM | POA: Diagnosis not present

## 2020-04-30 DIAGNOSIS — G4700 Insomnia, unspecified: Secondary | ICD-10-CM | POA: Diagnosis not present

## 2020-04-30 DIAGNOSIS — Z0000 Encounter for general adult medical examination without abnormal findings: Secondary | ICD-10-CM | POA: Diagnosis not present

## 2020-04-30 NOTE — Telephone Encounter (Signed)
Pt c/o BP issue: STAT if pt c/o blurred vision, one-sided weakness or slurred speech  1. What are your last 5 BP readings?   101/54 106/66 101/66 112/61  2. Are you having any other symptoms (ex. Dizziness, headache, blurred vision, passed out)? No  3. What is your BP issue? Patient states this morning, 04/30/20 at 10:30 AM, she had an appointment with her PCP and her BP was low. She states her PCP recommended that she contact Dr. Acie Fredrickson to adjust medication. Please call to discuss.

## 2020-04-30 NOTE — Telephone Encounter (Signed)
I spoke with patient. Readings below are from 7/20-7/23.  Today's reading was 101/54.  Patient checks BP every AM and most readings are similar to these.  Did have one reading on 7/13 that was 120/75.  She checks BP prior to taking AM medications.  I asked her to check about 2 hours after taking morning medications also.   She states today at PCP's office her BP was 101/54 and she was instructed to call Dr Acie Fredrickson to see if any changes need to be made.  She is staying hydrated. Feels fine. Is not taking amlodipine per 6/8 phone note but is taking other medications as listed.

## 2020-05-02 NOTE — Telephone Encounter (Signed)
BP looks fine off the amlodpine Please remove amlodipine from her med list since she is no longer taking it Continue current meds   Thanks

## 2020-05-03 NOTE — Telephone Encounter (Signed)
Left message to call office

## 2020-05-03 NOTE — Telephone Encounter (Signed)
Pt.notified

## 2020-05-03 NOTE — Addendum Note (Signed)
Addended by: Thompson Grayer on: 05/03/2020 08:35 AM   Modules accepted: Orders

## 2020-05-04 ENCOUNTER — Encounter (HOSPITAL_BASED_OUTPATIENT_CLINIC_OR_DEPARTMENT_OTHER): Payer: Self-pay | Admitting: Urology

## 2020-05-04 ENCOUNTER — Other Ambulatory Visit: Payer: Self-pay | Admitting: Physician Assistant

## 2020-05-04 ENCOUNTER — Other Ambulatory Visit: Payer: Self-pay

## 2020-05-04 NOTE — Progress Notes (Addendum)
Addendum: spoke with Konrad Felix pa, pt meets [facility] guidelines.  Spoke w/ via phone for pre-op interview--Pt- Lab needs dos----    I stat 8            COVID test ------05-06-2020 at 900 am Arrive at -------530 am 05-10-2020 NPO after MN NO Solid Food.  Clear liquids from MN until---430 am then npo Medications to take morning of surgery -----isosorbide mononitrate, sertraline, simvastatin, hydrazaline, carvedilol Diabetic medication -----n/a Patient Special Instructions -----none Pre-Op special Istructions -----none Patient verbalized understanding of instructions that were given at this phone interview. Patient denies shortness of breath, chest pain, fever, cough a this phone interview.  Anesthesia : moderate cad, chf, reviewed by Janett Billow zanetto 11-13-2019 for 11-17-2019 surgery at [facility]  Chart to Konrad Felix pa for review  PCP: Berniece Pap Cardiologist : dr Lilian Coma bhavinkumar bhagat pa 02-06-2020 epic Chest x-ray :none EKG :11-05-2019 epic Echo :06-28-2019 epic Stress test 06-30-2019 epic Cardiac Cath : 11-06-2019 epic Activity level: climbs flight of starts without difficulty, does own housework Sleep Study/ CPAP :none Fasting Blood Sugar :      / Checks Blood Sugar -- times a day: n/a  Blood Thinner/ Instructions /Last Dose:n/a ASA / Instructions/ Last Dose : pt stated will stop aspirin 5 days before surgery like she die for 11-17-2019 surgery

## 2020-05-06 ENCOUNTER — Other Ambulatory Visit (HOSPITAL_COMMUNITY)
Admission: RE | Admit: 2020-05-06 | Discharge: 2020-05-06 | Disposition: A | Discharge status: Home / Self Care | Payer: Medicare PPO | Source: Ambulatory Visit | Attending: Urology | Admitting: Urology

## 2020-05-06 DIAGNOSIS — Z20822 Contact with and (suspected) exposure to covid-19: Secondary | ICD-10-CM | POA: Insufficient documentation

## 2020-05-06 DIAGNOSIS — Z01812 Encounter for preprocedural laboratory examination: Secondary | ICD-10-CM | POA: Insufficient documentation

## 2020-05-06 LAB — SARS CORONAVIRUS 2 (TAT 6-24 HRS): SARS Coronavirus 2: NEGATIVE

## 2020-05-08 ENCOUNTER — Encounter (HOSPITAL_BASED_OUTPATIENT_CLINIC_OR_DEPARTMENT_OTHER): Payer: Self-pay | Admitting: Urology

## 2020-05-08 NOTE — Anesthesia Preprocedure Evaluation (Addendum)
Anesthesia Evaluation  Patient identified by MRN, date of birth, ID band Patient awake    Reviewed: Allergy & Precautions, NPO status , Patient's Chart, lab work & pertinent test results, reviewed documented beta blocker date and time   Airway Mallampati: II  TM Distance: >3 FB Neck ROM: Full    Dental no notable dental hx. (+) Teeth Intact, Dental Advisory Given   Pulmonary former smoker,    Pulmonary exam normal breath sounds clear to auscultation       Cardiovascular hypertension, Pt. on medications and Pt. on home beta blockers + CAD and +CHF  Normal cardiovascular exam Rhythm:Regular Rate:Normal  LVEF 40-45% diffuse hypokinesis   Neuro/Psych PSYCHIATRIC DISORDERS Anxiety Depression negative neurological ROS     GI/Hepatic negative GI ROS, Neg liver ROS,   Endo/Other  Hyperlipidemia Obesity  Renal/GU Renal InsufficiencyRenal diseaseLeft ureteral Ca S/P nephrectomy  negative genitourinary   Musculoskeletal   Abdominal (+) + obese,   Peds  Hematology   Anesthesia Other Findings   Reproductive/Obstetrics                            Anesthesia Physical Anesthesia Plan  ASA: III  Anesthesia Plan: General   Post-op Pain Management:    Induction: Intravenous  PONV Risk Score and Plan: 4 or greater and Treatment may vary due to age or medical condition  Airway Management Planned: LMA  Additional Equipment:   Intra-op Plan:   Post-operative Plan: Extubation in OR  Informed Consent: I have reviewed the patients History and Physical, chart, labs and discussed the procedure including the risks, benefits and alternatives for the proposed anesthesia with the patient or authorized representative who has indicated his/her understanding and acceptance.     Dental advisory given  Plan Discussed with: CRNA and Anesthesiologist  Anesthesia Plan Comments:        Anesthesia Quick  Evaluation

## 2020-05-10 ENCOUNTER — Encounter (HOSPITAL_BASED_OUTPATIENT_CLINIC_OR_DEPARTMENT_OTHER)
Admission: RE | Disposition: A | Discharge status: Home / Self Care | Payer: Self-pay | Source: Home / Self Care | Attending: Urology

## 2020-05-10 ENCOUNTER — Ambulatory Visit (HOSPITAL_BASED_OUTPATIENT_CLINIC_OR_DEPARTMENT_OTHER): Payer: Medicare PPO | Admitting: Certified Registered"

## 2020-05-10 ENCOUNTER — Ambulatory Visit (HOSPITAL_BASED_OUTPATIENT_CLINIC_OR_DEPARTMENT_OTHER)
Admission: RE | Admit: 2020-05-10 | Discharge: 2020-05-10 | Disposition: A | Discharge status: Home / Self Care | Payer: Medicare PPO | Attending: Urology | Admitting: Urology

## 2020-05-10 ENCOUNTER — Encounter (HOSPITAL_BASED_OUTPATIENT_CLINIC_OR_DEPARTMENT_OTHER): Payer: Self-pay | Admitting: Urology

## 2020-05-10 DIAGNOSIS — Z833 Family history of diabetes mellitus: Secondary | ICD-10-CM | POA: Diagnosis not present

## 2020-05-10 DIAGNOSIS — Z905 Acquired absence of kidney: Secondary | ICD-10-CM | POA: Diagnosis not present

## 2020-05-10 DIAGNOSIS — Z8249 Family history of ischemic heart disease and other diseases of the circulatory system: Secondary | ICD-10-CM | POA: Insufficient documentation

## 2020-05-10 DIAGNOSIS — Z8551 Personal history of malignant neoplasm of bladder: Secondary | ICD-10-CM | POA: Diagnosis not present

## 2020-05-10 DIAGNOSIS — Z90710 Acquired absence of both cervix and uterus: Secondary | ICD-10-CM | POA: Insufficient documentation

## 2020-05-10 DIAGNOSIS — Z888 Allergy status to other drugs, medicaments and biological substances status: Secondary | ICD-10-CM | POA: Insufficient documentation

## 2020-05-10 DIAGNOSIS — I2510 Atherosclerotic heart disease of native coronary artery without angina pectoris: Secondary | ICD-10-CM | POA: Diagnosis not present

## 2020-05-10 DIAGNOSIS — C662 Malignant neoplasm of left ureter: Secondary | ICD-10-CM | POA: Diagnosis not present

## 2020-05-10 DIAGNOSIS — I110 Hypertensive heart disease with heart failure: Secondary | ICD-10-CM | POA: Diagnosis not present

## 2020-05-10 DIAGNOSIS — Z906 Acquired absence of other parts of urinary tract: Secondary | ICD-10-CM | POA: Insufficient documentation

## 2020-05-10 DIAGNOSIS — Z6830 Body mass index (BMI) 30.0-30.9, adult: Secondary | ICD-10-CM | POA: Diagnosis not present

## 2020-05-10 DIAGNOSIS — F419 Anxiety disorder, unspecified: Secondary | ICD-10-CM | POA: Insufficient documentation

## 2020-05-10 DIAGNOSIS — Z466 Encounter for fitting and adjustment of urinary device: Secondary | ICD-10-CM | POA: Diagnosis not present

## 2020-05-10 DIAGNOSIS — I5043 Acute on chronic combined systolic (congestive) and diastolic (congestive) heart failure: Secondary | ICD-10-CM | POA: Diagnosis not present

## 2020-05-10 DIAGNOSIS — Z881 Allergy status to other antibiotic agents status: Secondary | ICD-10-CM | POA: Insufficient documentation

## 2020-05-10 DIAGNOSIS — I509 Heart failure, unspecified: Secondary | ICD-10-CM | POA: Diagnosis not present

## 2020-05-10 DIAGNOSIS — Z8554 Personal history of malignant neoplasm of ureter: Secondary | ICD-10-CM | POA: Diagnosis not present

## 2020-05-10 DIAGNOSIS — R9341 Abnormal radiologic findings on diagnostic imaging of renal pelvis, ureter, or bladder: Secondary | ICD-10-CM | POA: Diagnosis not present

## 2020-05-10 DIAGNOSIS — N189 Chronic kidney disease, unspecified: Secondary | ICD-10-CM | POA: Diagnosis not present

## 2020-05-10 DIAGNOSIS — Z7982 Long term (current) use of aspirin: Secondary | ICD-10-CM | POA: Insufficient documentation

## 2020-05-10 DIAGNOSIS — I130 Hypertensive heart and chronic kidney disease with heart failure and stage 1 through stage 4 chronic kidney disease, or unspecified chronic kidney disease: Secondary | ICD-10-CM | POA: Insufficient documentation

## 2020-05-10 DIAGNOSIS — Z87891 Personal history of nicotine dependence: Secondary | ICD-10-CM | POA: Diagnosis not present

## 2020-05-10 DIAGNOSIS — E785 Hyperlipidemia, unspecified: Secondary | ICD-10-CM | POA: Insufficient documentation

## 2020-05-10 DIAGNOSIS — F329 Major depressive disorder, single episode, unspecified: Secondary | ICD-10-CM | POA: Insufficient documentation

## 2020-05-10 DIAGNOSIS — E669 Obesity, unspecified: Secondary | ICD-10-CM | POA: Insufficient documentation

## 2020-05-10 DIAGNOSIS — Z85528 Personal history of other malignant neoplasm of kidney: Secondary | ICD-10-CM | POA: Diagnosis not present

## 2020-05-10 DIAGNOSIS — N135 Crossing vessel and stricture of ureter without hydronephrosis: Secondary | ICD-10-CM | POA: Diagnosis not present

## 2020-05-10 DIAGNOSIS — Z79899 Other long term (current) drug therapy: Secondary | ICD-10-CM | POA: Insufficient documentation

## 2020-05-10 HISTORY — PX: CYSTOSCOPY W/ URETERAL STENT REMOVAL: SHX1430

## 2020-05-10 HISTORY — PX: CYSTOSCOPY WITH RETROGRADE PYELOGRAM, URETEROSCOPY AND STENT PLACEMENT: SHX5789

## 2020-05-10 LAB — POCT I-STAT, CHEM 8
BUN: 16 mg/dL (ref 8–23)
Calcium, Ion: 1.24 mmol/L (ref 1.15–1.40)
Chloride: 99 mmol/L (ref 98–111)
Creatinine, Ser: 1.4 mg/dL — ABNORMAL HIGH (ref 0.44–1.00)
Glucose, Bld: 110 mg/dL — ABNORMAL HIGH (ref 70–99)
HCT: 43 % (ref 36.0–46.0)
Hemoglobin: 14.6 g/dL (ref 12.0–15.0)
Potassium: 3.7 mmol/L (ref 3.5–5.1)
Sodium: 141 mmol/L (ref 135–145)
TCO2: 27 mmol/L (ref 22–32)

## 2020-05-10 SURGERY — CYSTOURETEROSCOPY, WITH RETROGRADE PYELOGRAM AND STENT INSERTION
Anesthesia: General | Site: Ureter | Laterality: Left

## 2020-05-10 MED ORDER — IOHEXOL 300 MG/ML  SOLN
INTRAMUSCULAR | Status: DC | PRN
  Administered 2020-05-10: 60 mL via URETHRAL

## 2020-05-10 MED ORDER — PROPOFOL 10 MG/ML IV BOLUS
INTRAVENOUS | Status: DC | PRN
  Administered 2020-05-10: 110 mg via INTRAVENOUS

## 2020-05-10 MED ORDER — OXYCODONE HCL 5 MG PO TABS
ORAL_TABLET | ORAL | Status: AC
  Filled 2020-05-10: qty 1

## 2020-05-10 MED ORDER — LIDOCAINE 2% (20 MG/ML) 5 ML SYRINGE
INTRAMUSCULAR | Status: DC | PRN
  Administered 2020-05-10: 50 mg via INTRAVENOUS

## 2020-05-10 MED ORDER — ONDANSETRON HCL 4 MG/2ML IJ SOLN: 4.0000 mg | Freq: Once | INTRAMUSCULAR | Status: DC | PRN

## 2020-05-10 MED ORDER — DEXAMETHASONE SODIUM PHOSPHATE 10 MG/ML IJ SOLN
INTRAMUSCULAR | Status: AC
  Filled 2020-05-10: qty 1

## 2020-05-10 MED ORDER — FENTANYL CITRATE (PF) 100 MCG/2ML IJ SOLN: 25.0000 ug | INTRAMUSCULAR | Status: DC | PRN

## 2020-05-10 MED ORDER — DEXAMETHASONE SODIUM PHOSPHATE 10 MG/ML IJ SOLN
INTRAMUSCULAR | Status: DC | PRN
  Administered 2020-05-10: 4 mg via INTRAVENOUS

## 2020-05-10 MED ORDER — ONDANSETRON HCL 4 MG/2ML IJ SOLN
INTRAMUSCULAR | Status: DC | PRN
  Administered 2020-05-10: 4 mg via INTRAVENOUS

## 2020-05-10 MED ORDER — CEFAZOLIN SODIUM-DEXTROSE 2-4 GM/100ML-% IV SOLN
2.0000 g | INTRAVENOUS | Status: AC
  Administered 2020-05-10: 2 g via INTRAVENOUS

## 2020-05-10 MED ORDER — LIDOCAINE 2% (20 MG/ML) 5 ML SYRINGE
INTRAMUSCULAR | Status: AC
  Filled 2020-05-10: qty 5

## 2020-05-10 MED ORDER — OXYCODONE HCL 5 MG/5ML PO SOLN: 5.0000 mg | Freq: Once | ORAL | Status: AC | PRN

## 2020-05-10 MED ORDER — FENTANYL CITRATE (PF) 100 MCG/2ML IJ SOLN
INTRAMUSCULAR | Status: DC | PRN
  Administered 2020-05-10: 50 ug via INTRAVENOUS

## 2020-05-10 MED ORDER — OXYCODONE HCL 5 MG PO TABS
5.0000 mg | ORAL_TABLET | Freq: Once | ORAL | Status: AC | PRN
  Administered 2020-05-10: 5 mg via ORAL

## 2020-05-10 MED ORDER — PROPOFOL 10 MG/ML IV BOLUS
INTRAVENOUS | Status: AC
  Filled 2020-05-10: qty 20

## 2020-05-10 MED ORDER — EPHEDRINE SULFATE-NACL 50-0.9 MG/10ML-% IV SOSY
PREFILLED_SYRINGE | INTRAVENOUS | Status: DC | PRN
  Administered 2020-05-10 (×3): 10 mg via INTRAVENOUS

## 2020-05-10 MED ORDER — CEPHALEXIN 500 MG PO CAPS: 500.0000 mg | ORAL_CAPSULE | Freq: Two times a day (BID) | ORAL | 0 refills | Status: AC

## 2020-05-10 MED ORDER — CEFAZOLIN SODIUM-DEXTROSE 2-4 GM/100ML-% IV SOLN
INTRAVENOUS | Status: AC
  Filled 2020-05-10: qty 100

## 2020-05-10 MED ORDER — SODIUM CHLORIDE 0.9 % IR SOLN
Status: DC | PRN
  Administered 2020-05-10: 3000 mL via INTRAVESICAL

## 2020-05-10 MED ORDER — EPHEDRINE 5 MG/ML INJ
INTRAVENOUS | Status: AC
  Filled 2020-05-10: qty 10

## 2020-05-10 MED ORDER — ONDANSETRON HCL 4 MG/2ML IJ SOLN
INTRAMUSCULAR | Status: AC
  Filled 2020-05-10: qty 2

## 2020-05-10 MED ORDER — FENTANYL CITRATE (PF) 100 MCG/2ML IJ SOLN
INTRAMUSCULAR | Status: AC
  Filled 2020-05-10: qty 2

## 2020-05-10 MED ORDER — MEPERIDINE HCL 25 MG/ML IJ SOLN: 6.2500 mg | INTRAMUSCULAR | Status: DC | PRN

## 2020-05-10 MED ORDER — SODIUM CHLORIDE 0.9 % IV SOLN: INTRAVENOUS | Status: DC

## 2020-05-10 SURGICAL SUPPLY — 33 items
BAG DRAIN URO-CYSTO SKYTR STRL (DRAIN) ×4 IMPLANT
BAG DRN UROCATH (DRAIN) ×2
BASKET ZERO TIP NITINOL 2.4FR (BASKET) IMPLANT
BRUSH URET BIOPSY 3F (UROLOGICAL SUPPLIES) ×4 IMPLANT
BSKT STON RTRVL ZERO TP 2.4FR (BASKET)
CATH INTERMIT  6FR 70CM (CATHETERS) ×4 IMPLANT
CATH URET 5FR 28IN OPEN ENDED (CATHETERS) ×4 IMPLANT
CLOTH BEACON ORANGE TIMEOUT ST (SAFETY) ×4 IMPLANT
ELECT REM PT RETURN 9FT ADLT (ELECTROSURGICAL)
ELECTRODE REM PT RTRN 9FT ADLT (ELECTROSURGICAL) IMPLANT
FIBER LASER FLEXIVA 365 (UROLOGICAL SUPPLIES) IMPLANT
FIBER LASER TRAC TIP (UROLOGICAL SUPPLIES) IMPLANT
GLOVE BIO SURGEON STRL SZ 6.5 (GLOVE) ×3 IMPLANT
GLOVE BIO SURGEON STRL SZ8 (GLOVE) ×4 IMPLANT
GLOVE BIO SURGEONS STRL SZ 6.5 (GLOVE) ×1
GOWN STRL REUS W/ TWL XL LVL3 (GOWN DISPOSABLE) ×4 IMPLANT
GOWN STRL REUS W/TWL XL LVL3 (GOWN DISPOSABLE) ×8
GUIDEWIRE ANG ZIPWIRE 038X150 (WIRE) IMPLANT
GUIDEWIRE STR DUAL SENSOR (WIRE) ×8 IMPLANT
IV NS IRRIG 3000ML ARTHROMATIC (IV SOLUTION) ×4 IMPLANT
KIT BALLIN UROMAX 15FX10 (LABEL) ×2 IMPLANT
KIT TURNOVER CYSTO (KITS) ×4 IMPLANT
MANIFOLD NEPTUNE II (INSTRUMENTS) ×4 IMPLANT
NS IRRIG 500ML POUR BTL (IV SOLUTION) ×4 IMPLANT
PACK CYSTO (CUSTOM PROCEDURE TRAY) ×4 IMPLANT
SET HIGH PRES BAL DIL (LABEL) ×4
SHEATH URETERAL 12FRX35CM (MISCELLANEOUS) IMPLANT
STENT URET 6FRX24 CONTOUR (STENTS) ×4 IMPLANT
SYR CONTROL 10ML LL (SYRINGE) ×4 IMPLANT
TUBE CONNECTING 12'X1/4 (SUCTIONS) ×1
TUBE CONNECTING 12X1/4 (SUCTIONS) ×3 IMPLANT
TUBING UROLOGY SET (TUBING) ×4 IMPLANT
UroMax Ultra ×4 IMPLANT

## 2020-05-10 NOTE — Anesthesia Postprocedure Evaluation (Signed)
Anesthesia Post Note  Patient: [name]  Procedure(s) Performed: CYSTOSCOPY WITH RETROGRADE PYELOGRAM, URETEROSCOPY AND STENT PLACEMENT WITH URETHRAL DIALATION AND BRUSH BIOPSY (Left Ureter) CYSTOSCOPY WITH STENT REMOVAL (Left Ureter)     Patient location during evaluation: PACU Anesthesia Type: General Level of consciousness: awake and alert and oriented Pain management: pain level controlled Vital Signs Assessment: post-procedure vital signs reviewed and stable Respiratory status: spontaneous breathing, nonlabored ventilation and respiratory function stable Cardiovascular status: blood pressure returned to baseline and stable Postop Assessment: no apparent nausea or vomiting Anesthetic complications: no   No complications documented.  Last Vitals:  Vitals:   05/10/20 0930 05/10/20 1010  BP: (!) 129/73 (!) 101/57  Pulse: 66 70  Resp: 16 16  Temp:  (!) 36.3 C  SpO2: 91% 94%    Last Pain:  Vitals:   05/10/20 1010  TempSrc: Oral  PainSc: 6                  Lisaann Atha A.

## 2020-05-10 NOTE — Interval H&P Note (Signed)
History and Physical Interval Note:  05/10/2020 7:28 AM  [name]  has presented today for surgery, with the diagnosis of LEFT URETERAL CANCER.  The various methods of treatment have been discussed with the patient and family. After consideration of risks, benefits and other options for treatment, the patient has consented to  Procedure(s) with comments: CYSTOSCOPY WITH RETROGRADE PYELOGRAM, URETEROSCOPY AND STENT PLACEMENT (Left) - 1 HR HOLMIUM LASER APPLICATION (Left) CYSTOSCOPY WITH STENT REMOVAL (Left) as a surgical intervention.  The patient's history has been reviewed, patient examined, no change in status, stable for surgery.  I have reviewed the patient's chart and labs.  Questions were answered to the patient's satisfaction.     [name]

## 2020-05-10 NOTE — Transfer of Care (Signed)
Immediate Anesthesia Transfer of Care Note  Patient: [name]  Procedure(s) Performed: Procedure(s) (LRB): CYSTOSCOPY WITH RETROGRADE PYELOGRAM, URETEROSCOPY AND STENT PLACEMENT WITH URETHRAL DIALATION AND BRUSH BIOPSY (Left) CYSTOSCOPY WITH STENT REMOVAL (Left)  Patient Location: PACU  Anesthesia Type: General  Level of Consciousness: awake, oriented, sedated and patient cooperative  Airway & Oxygen Therapy: Patient Spontanous Breathing and Patient connected to face mask oxygen  Post-op Assessment: Report given to PACU RN and Post -op Vital signs reviewed and stable  Post vital signs: Reviewed and stable  Complications: No apparent anesthesia complications Last Vitals:  Vitals Value Taken Time  BP 141/66 05/10/20 0850  Temp 36.9 C 05/10/20 0851  Pulse 77 05/10/20 0858  Resp 13 05/10/20 0858  SpO2 96 % 05/10/20 0858  Vitals shown include unvalidated device data.  Last Pain:  Vitals:   05/10/20 0559  TempSrc: Oral  PainSc: 0-No pain      Patients Stated Pain Goal: 5 (58/83/25 4982)  Complications: No complications documented.

## 2020-05-10 NOTE — Anesthesia Procedure Notes (Signed)
Procedure Name: LMA Insertion Date/Time: 05/10/2020 7:39 AM Performed by: Suan Halter, CRNA Pre-anesthesia Checklist: Patient identified, Emergency Drugs available, Suction available and Patient being monitored Patient Re-evaluated:Patient Re-evaluated prior to induction Oxygen Delivery Method: Circle system utilized Preoxygenation: Pre-oxygenation with 100% oxygen Induction Type: IV induction Ventilation: Mask ventilation without difficulty LMA: LMA inserted LMA Size: 4.0 Number of attempts: 1 Airway Equipment and Method: Bite block Placement Confirmation: positive ETCO2 Tube secured with: Tape Dental Injury: Teeth and Oropharynx as per pre-operative assessment

## 2020-05-10 NOTE — Op Note (Signed)
Preoperative diagnosis: History of urothelial carcinoma of the left ureter, solitary left kidney  Postoperative diagnosis: Same, no evidence of recurrence of urothelial carcinoma, significant left mid ureteral stricture  Principal procedure: Cystoscopy, left double-J stent extraction, left retrograde ureteropyelogram, fluoroscopic interpretation, left ureteroscopy, dilatation of left ureteral stricture, breast biopsy of left ureteral stricture, left renal washings, replacement of 6 French by 24 cm contour double-J stent without tether  Surgeon: Nixxon Faria  Anesthesia: General with LMA  Complications: Difficulty crossing left ureteral stricture, need for dilatation  Specimens: Left renal washings for cytology, brush biopsy of left ureteral stricture for cytology  Estimated blood loss: Less than 50 mL  Indications: 74 year old female years out from right nephro ureterectomy and TURBT for high-grade nonmuscle invasive bladder cancer and right distal ureteral urothelial carcinoma.  Last year she was found to have a recurrence in her left mid/distal ureter with significant hydronephrosis.  She underwent cystoscopy, double-J stent placement followed by separate procedure for biopsy of her urothelial tumor as well as laser ablation.  She underwent follow-up procedure in early 2021 for surveillance with thulium laser of remaining small areas of tumor.  She has undergone BCG induction and maintenance.  She presents at this time for follow-up cystoscopy, ureteroscopy, possible laser ablation of any remaining urothelial tumors.  I discussed the procedure with the patient, including risks and complications which include but are not limited to infection, bleeding, anesthetic complications, inability to access her kidney.  She understands these and desires to proceed.  Findings: Urothelium of the bladder was normal.  Ureteral orifice ease were normal.  Left double-J stent was properly positioned with minimal  encrustation.  Retrograde study of the ureter once the stent was removed revealed no evidence of hydronephrosis, no pyelocaliectasis, no evidence of filling defects within the ureter or pyelocalyceal system.  There was narrowing of the ureter fluoroscopically at the pelvic brim.  Ureteroscopically, there was a dense stricture in this area and it was very difficult to navigate a guidewire through.  Following eventual dilatation of this area, there was no evidence of urothelial abnormality although there was evidence of this stricture.  Proximal to this stricture the urothelium was normal.  Using only the semirigid ureteroscope, the renal pelvis appeared to have normal urothelium.  Description of procedure: The patient was properly identified and marked in the holding area.  She received preoperative IV antibiotics and was taken to the operating room where general anesthetic was administered with the LMA.  She was placed in the dorsolithotomy position, genitalia and perineum prepped and draped.  Proper timeout performed.  64 French panendoscope was advanced into the bladder.  After thorough inspection cystoscopically, with normal findings, the stent was extracted.  Using a 6 [place] open-ended catheter and Omnipaque retrograde ureteropyelogram was performed with the above-mentioned findings.  Following this, the open-ended catheter was removed and sensor tip guidewire was utilized to access the pyelocalyceal system.  This was unsuccessful due to what I considered a stricture.  Zip wire was also used, again this was unsuccessful.  I then passed the semirigid ureteroscope up to this area of stricture.  Multiple attempts were made to pass the sensor tip and zip wire, eventually I was able to navigate the sensor tip guidewire into what I thought was the proximal ureter.  This was verified by passing a 5 [place] open-ended catheter over top, removing the guidewire and seeing a hydronephrotic drip.  Retrograde study with  Omnipaque verified that we have accessed the pyelocalyceal system.  Knowing that  this was more than likely a stricture, guidewire was passed through the open-ended catheter which was then removed.  Using the 10 cm ureteral balloon dilator, the stricture was dilated for approximately 4 minutes and then removed.  A second guidewire was passed for safety purposes.  I then navigated the semirigid ureteroscope through this area of stricture.  No obvious urothelial abnormalities were noted either around the stricture or in the proximal ureter or pyelocalyceal system.  Once that was in the pyelocalyceal system washings were taken for cytology.  The ureter was again totally visualized by removing and then replacing the ureteroscope.  Following this, using fluoroscopic guidance with the scope removed, I passed a cytology brush and passed several times through the strictured area.  This was then removed and sent for cytology.  At this point, I placed a 6 French, 24 cm contour double-J stent with the tether removed.  Excellent proximal and distal curls were seen using fluoroscopy and cystoscopy, respectively.  The bladder was drained and the scope removed.  The patient was then awakened.  She was taken to the PACU in stable condition, having tolerated the procedure well.

## 2020-05-10 NOTE — H&P (Signed)
H&P  Chief Complaint: Cancer I LT ureter  History of Present Illness: [name] is a 74 y.o. year old female 73 yo female presents for cysto/Lt J2 stent extraction, Lt URS w/ possible HL Rx of ureteral tumor(s), J2 stent replacement.  Past Medical History:  Diagnosis Date  . Anxiety   . CAD in native artery    a. cath 10/2019- mild to moderate dx >> medical therapy   . Cancer [facility])    left ureteral cancer dx sept 2020  . CHF (congestive heart failure) ([place])   . Chronic kidney disease 06/2019   acute renal insufficiency  . Depression   . History of kidney cancer   . Hyperlipidemia   . Hypertension     Past Surgical History:  Procedure Laterality Date  . ABDOMINAL HYSTERECTOMY     partial  . bladder-partial removal    . CYSTOSCOPY W/ URETERAL STENT PLACEMENT Left 11/17/2019   Procedure: CYSTOSCOPY WITH STENT REPLACEMENT;  Surgeon: Franchot Gallo, MD;  Location: [facility];  Service: Urology;  Laterality: Left;  . CYSTOSCOPY WITH RETROGRADE PYELOGRAM, URETEROSCOPY AND STENT PLACEMENT Left 06/27/2019   Procedure: CYSTOSCOPY WITH LEFT RETROGRADE PYELOGRAM, URETEROSCOPY, BIOPSY AND LEFT STENT PLACEMENT;  Surgeon: Irine Seal, MD;  Location: [facility];  Service: Urology;  Laterality: Left;  . CYSTOSCOPY WITH RETROGRADE PYELOGRAM, URETEROSCOPY AND STENT PLACEMENT Left 08/18/2019   Procedure: CYSTOSCOPY, URETEROSCOPY AND STENT EXCHANGE;  Surgeon: Franchot Gallo, MD;  Location: [facility];  Service: Urology;  Laterality: Left;  90 MINS  . CYSTOSCOPY WITH RETROGRADE PYELOGRAM, URETEROSCOPY AND STENT PLACEMENT Left 11/17/2019   Procedure: CYSTOSCOPY WITH RETROGRADE PYELOGRAM, URETEROSCOPY AND STENT PLACEMENT;  Surgeon: Franchot Gallo, MD;  Location: [facility];  Service: Urology;  Laterality: Left;  55 MINS  . kidney removed     right   . RIGHT/LEFT HEART CATH AND CORONARY ANGIOGRAPHY N/A 11/06/2019   Procedure: RIGHT/LEFT HEART CATH AND CORONARY  ANGIOGRAPHY;  Surgeon: Belva Crome, MD;  Location: [facility];  Service: Cardiovascular;  Laterality: N/A;  . THULIUM LASER TURP (TRANSURETHRAL RESECTION OF PROSTATE) Left 08/18/2019   Procedure: THULIUM LASER ABLATION OF URETERAL TUMOR;  Surgeon: Franchot Gallo, MD;  Location: [facility];  Service: Urology;  Laterality: Left;  . THULIUM LASER TURP (TRANSURETHRAL RESECTION OF PROSTATE) Left 11/17/2019   Procedure: THULIUM LASER of URETERAL CANCER;  Surgeon: Franchot Gallo, MD;  Location: [facility];  Service: Urology;  Laterality: Left;    Home Medications:  Medications Prior to Admission  Medication Sig Dispense Refill  . acetaminophen (TYLENOL) 500 MG tablet Take 500 mg by mouth every 6 (six) hours as needed (for pain.).    Marland Kitchen calcium carbonate (TUMS - DOSED IN MG ELEMENTAL CALCIUM) 500 MG chewable tablet Chew 1 tablet by mouth daily.    . carvedilol (COREG) 6.25 MG tablet Take 1 tablet (6.25 mg total) by mouth 2 (two) times daily with a meal. 180 tablet 3  . hydrALAZINE (APRESOLINE) 50 MG tablet TAKE 1 TABLET(50 MG) BY MOUTH EVERY 8 HOURS 270 tablet 3  . isosorbide mononitrate (IMDUR) 30 MG 24 hr tablet TAKE 1 TABLET(30 MG) BY MOUTH DAILY 90 tablet 3  . sertraline (ZOLOFT) 50 MG tablet Take 50 mg by mouth daily.    . simvastatin (ZOCOR) 20 MG tablet Take 20 mg by mouth daily.    Marland Kitchen spironolactone (ALDACTONE) 25 MG tablet TAKE 1 TABLET(25 MG) BY MOUTH DAILY 90 tablet 3  . traZODone (DESYREL)  50 MG tablet Take 100 mg by mouth at bedtime.    Marland Kitchen aspirin EC 81 MG tablet Take 1 tablet (81 mg total) by mouth daily.      Allergies:  Allergies  Allergen Reactions  . Erythromycin Diarrhea and Nausea And Vomiting  . Lotensin [Benazepril Hcl] Cough    Family History  Problem Relation Age of Onset  . Hypertension Mother   . Diabetes Mellitus II Sister   . Hypertension Sister   . Hypertension Brother     Social History:  reports that she quit smoking about 16 years  ago. Her smoking use included cigarettes. She has a 19.00 pack-year smoking history. She has never used smokeless tobacco. She reports that she does not drink alcohol and does not use drugs.  ROS: A complete review of systems was performed.  All systems are negative except for pertinent findings as noted.  Physical Exam:  Vital signs in last 24 hours: Temp:  [98.1 F (36.7 C)] 98.1 F (36.7 C) (08/02 0559) Pulse Rate:  [77] 77 (08/02 0559) Resp:  [16] 16 (08/02 0559) BP: (124)/(62) 124/62 (08/02 0559) SpO2:  [96 %] 96 % (08/02 0559) Weight:  [77.2 kg] 77.2 kg (08/02 0559) General:  Alert and oriented, No acute distress HEENT: Normocephalic, atraumatic Neck: No JVD or lymphadenopathy Cardiovascular: Regular rate  Lungs: Normal inspiratory/expiratory excursion Abdomen: Soft, nontender, nondistended, no abdominal masses Back: No CVA tenderness Extremities: No edema Neurologic: Grossly intact  Laboratory Data:  Results for orders placed or performed during the hospital encounter of 05/10/20 (from the past 24 hour(s))  I-STAT, chem 8     Status: Abnormal   Collection Time: 05/10/20  6:04 AM  Result Value Ref Range   Sodium 141 135 - 145 mmol/L   Potassium 3.7 3.5 - 5.1 mmol/L   Chloride 99 98 - 111 mmol/L   BUN 16 8 - 23 mg/dL   Creatinine, Ser 1.40 (H) 0.44 - 1.00 mg/dL   Glucose, Bld 110 (H) 70 - 99 mg/dL   Calcium, Ion 1.24 1.15 - 1.40 mmol/L   TCO2 27 22 - 32 mmol/L   Hemoglobin 14.6 12.0 - 15.0 g/dL   HCT 43.0 36 - 46 %   Recent Results (from the past 240 hour(s))  SARS CORONAVIRUS 2 (TAT 6-24 HRS) Nasopharyngeal Nasopharyngeal Swab     Status: None   Collection Time: 05/06/20  8:48 AM   Specimen: Nasopharyngeal Swab  Result Value Ref Range Status   SARS Coronavirus 2 NEGATIVE NEGATIVE Final    Comment: (NOTE) SARS-CoV-2 target nucleic acids are NOT DETECTED.  The SARS-CoV-2 RNA is generally detectable in upper and lower respiratory specimens during the acute phase  of infection. Negative results do not preclude SARS-CoV-2 infection, do not rule out co-infections with other pathogens, and should not be used as the sole basis for treatment or other patient management decisions. Negative results must be combined with clinical observations, patient history, and epidemiological information. The expected result is Negative.  Fact Sheet for Patients: SugarRoll.be  Fact Sheet for Healthcare Providers: https://www.woods-mathews.com/  This test is not yet approved or cleared by the [place] FDA and  has been authorized for detection and/or diagnosis of SARS-CoV-2 by FDA under an Emergency Use Authorization (EUA). This EUA will remain  in effect (meaning this test can be used) for the duration of the COVID-19 declaration under Se ction 564(b)(1) of the Act, 21 U.S.C. section 360bbb-3(b)(1), unless the authorization is terminated or revoked sooner.  Performed at  [facility], [place] [address], [place], [place] [address]    Creatinine: Recent Labs    05/10/20 0604  CREATININE 1.40*    Radiologic Imaging: No results found.  Impression/Assessment:  Urotheliial Ca of Lt distal ureter  Plan:  Cysto, Lt J2 stent extraction, Lt URS, possible HL Rx of tumors  Lillette Boxer Edwen Mclester 05/10/2020, 7:19 AM  Lillette Boxer. Clarion Mooneyhan MD

## 2020-05-10 NOTE — Discharge Instructions (Signed)
1. You may see some blood in the urine and may have some burning with urination for 48-72 hours. You also may notice that you have to urinate more frequently or urgently after your procedure which is normal.  2. You should call should you develop an inability urinate, fever > 101, persistent nausea and vomiting that prevents you from eating or drinking to stay hydrated.  3. If you have a stent, you will likely urinate more frequently and urgently until the stent is removed and you may experience some discomfort/pain in the lower abdomen and flank especially when urinating. You may take pain medication prescribed to you if needed for pain. You may also intermittently have blood in the urine until the stent is removed.  Ureteral Stent Implantation, Care After This sheet gives you information about how to care for yourself after your procedure. Your health care provider may also give you more specific instructions. If you have problems or questions, contact your health care provider. What can I expect after the procedure? After the procedure, it is common to have:  Nausea.  Mild pain when you urinate. You may feel this pain in your lower back or lower abdomen. The pain should stop within a few minutes after you urinate. This may last for up to 1 week.  A small amount of blood in your urine for several days. Follow these instructions at home: Medicines  Take over-the-counter and prescription medicines only as told by your health care provider.  If you were prescribed an antibiotic medicine, take it as told by your health care provider. Do not stop taking the antibiotic even if you start to feel better.  Do not drive for 24 hours if you were given a sedative during your procedure.  Ask your health care provider if the medicine prescribed to you requires you to avoid driving or using heavy machinery. Activity  Rest as told by your health care provider.  Avoid sitting for a long time without  moving. Get up to take short walks every 1-2 hours. This is important to improve blood flow and breathing. Ask for help if you feel weak or unsteady.  Return to your normal activities as told by your health care provider. Ask your health care provider what activities are safe for you. General instructions   Watch for any blood in your urine. Call your health care provider if the amount of blood in your urine increases.  If you have a catheter: ? Follow instructions from your health care provider about taking care of your catheter and collection bag. ? Do not take baths, swim, or use a hot tub until your health care provider approves. Ask your health care provider if you may take showers. You may only be allowed to take sponge baths.  Drink enough fluid to keep your urine pale yellow.  Do not use any products that contain nicotine or tobacco, such as cigarettes, e-cigarettes, and chewing tobacco. These can delay healing after surgery. If you need help quitting, ask your health care provider.  Keep all follow-up visits as told by your health care provider. This is important. Contact a health care provider if:  You have pain that gets worse or does not get better with medicine, especially pain when you urinate.  You have difficulty urinating.  You feel nauseous or you vomit repeatedly during a period of more than 2 days after the procedure. Get help right away if:  Your urine is dark red or has blood clots  in it.  You are leaking urine (have incontinence).  The end of the stent comes out of your urethra.  You cannot urinate.  You have sudden, sharp, or severe pain in your abdomen or lower back.  You have a fever.  You have swelling or pain in your legs.  You have difficulty breathing. Summary  After the procedure, it is common to have mild pain when you urinate that goes away within a few minutes after you urinate. This may last for up to 1 week.  Watch for any blood in your  urine. Call your health care provider if the amount of blood in your urine increases.  Take over-the-counter and prescription medicines only as told by your health care provider.  Drink enough fluid to keep your urine pale yellow. This information is not intended to replace advice given to you by your health care provider. Make sure you discuss any questions you have with your health care provider. Document Revised: 07/02/2018 Document Reviewed: 07/03/2018 Elsevier Patient Education  [place] Instructions  Activity: Get plenty of rest for the remainder of the day. A responsible individual must stay with you for 24 hours following the procedure.  For the next 24 hours, DO NOT: -Drive a car -Paediatric nurse -Drink alcoholic beverages -Take any medication unless instructed by your physician -Make any legal decisions or sign important papers.  Meals: Start with liquid foods such as gelatin or soup. Progress to regular foods as tolerated. Avoid greasy, spicy, heavy foods. If nausea and/or vomiting occur, drink only clear liquids until the nausea and/or vomiting subsides. Call your physician if vomiting continues.  Special Instructions/Symptoms: Your throat may feel dry or sore from the anesthesia or the breathing tube placed in your throat during surgery. If this causes discomfort, gargle with warm salt water. The discomfort should disappear within 24 hours.

## 2020-05-11 ENCOUNTER — Encounter (HOSPITAL_BASED_OUTPATIENT_CLINIC_OR_DEPARTMENT_OTHER): Payer: Self-pay | Admitting: Urology

## 2020-05-11 LAB — CYTOLOGY - NON PAP

## 2020-06-11 DIAGNOSIS — C670 Malignant neoplasm of trigone of bladder: Secondary | ICD-10-CM | POA: Diagnosis not present

## 2020-06-11 DIAGNOSIS — C678 Malignant neoplasm of overlapping sites of bladder: Secondary | ICD-10-CM | POA: Diagnosis not present

## 2020-08-20 DIAGNOSIS — Z8551 Personal history of malignant neoplasm of bladder: Secondary | ICD-10-CM | POA: Diagnosis not present

## 2020-08-20 DIAGNOSIS — C669 Malignant neoplasm of unspecified ureter: Secondary | ICD-10-CM | POA: Diagnosis not present

## 2020-08-20 DIAGNOSIS — R8279 Other abnormal findings on microbiological examination of urine: Secondary | ICD-10-CM | POA: Diagnosis not present

## 2020-08-23 ENCOUNTER — Other Ambulatory Visit: Payer: Self-pay | Admitting: Urology

## 2020-08-23 ENCOUNTER — Encounter: Payer: Self-pay | Admitting: Cardiovascular Disease

## 2020-08-23 NOTE — Progress Notes (Signed)
Cardiology Office Note:    Date:  08/24/2020   ID:  [name], DOB 1945-11-10, MRN [id number]  PCP:  [name], MD  [facility] Cardiologist:  Werner Lean, MD  [place] Electrophysiologist:  None   Referring MD: [name], *   Chief Complaint  Patient presents with  . Congestive Heart Failure  . Coronary Artery Disease    Nov. 16, 2021    [name] is a 74 y.o. female with a hx of CHF, HTN, acute renal insufficiency  [name] is seen back today for follow up visit  I met her in Sept 2020 when she was admitted with CHF She had mildly reduced LV function with EF of  40-45%/. Leane Call was low risk ,  Cath showed  LAD - mild - mod disease, Diag, 70 % stenosis LCx - moderate CAD  Nov. 16, 2021:  [name] is seen back today for follow up visi t Has CHF, mild CAD  BP and HR look good  The spironolactone causes hypotension, dizziness, head ache.  She is also on Imdur and hydralazine .  Occurs every several weeks.   She is scheduled for a renal procedure ( ureteral stent removal and possible new ureteral  stent placement  in Dec.  Has had a cancer in her left ureter .  Quit smoking in 2005  No cp   She is at low risk for her procedure .   OK to hold ASA for 5 days prior to procedure.    Past Medical History:  Diagnosis Date  . Anxiety   . CAD in native artery    a. cath 10/2019- mild to moderate dx >> medical therapy   . Cancer [facility])    left ureteral cancer dx sept 2020  . CHF (congestive heart failure) ([place])   . Chronic kidney disease 06/2019   acute renal insufficiency  . Depression   . History of kidney cancer   . Hyperlipidemia   . Hypertension     Past Surgical History:  Procedure Laterality Date  . ABDOMINAL HYSTERECTOMY     partial  . bladder-partial removal    . CYSTOSCOPY W/ URETERAL STENT PLACEMENT Left 11/17/2019   Procedure: CYSTOSCOPY WITH STENT REPLACEMENT;  Surgeon: Franchot Gallo, MD;  Location: [facility];  Service: Urology;  Laterality: Left;  . CYSTOSCOPY W/ URETERAL STENT REMOVAL Left 05/10/2020   Procedure: CYSTOSCOPY WITH STENT REMOVAL;  Surgeon: Franchot Gallo, MD;  Location: [facility];  Service: Urology;  Laterality: Left;  . CYSTOSCOPY WITH RETROGRADE PYELOGRAM, URETEROSCOPY AND STENT PLACEMENT Left 06/27/2019   Procedure: CYSTOSCOPY WITH LEFT RETROGRADE PYELOGRAM, URETEROSCOPY, BIOPSY AND LEFT STENT PLACEMENT;  Surgeon: Irine Seal, MD;  [facility];  Service: Urology;  Laterality: Left;  . CYSTOSCOPY WITH RETROGRADE PYELOGRAM, URETEROSCOPY AND STENT PLACEMENT Left 08/18/2019   Procedure: CYSTOSCOPY, URETEROSCOPY AND STENT EXCHANGE;  Surgeon: Franchot Gallo, MD;  [facility];  Service: Urology;  Laterality: Left;  90 MINS  . CYSTOSCOPY WITH RETROGRADE PYELOGRAM, URETEROSCOPY AND STENT PLACEMENT Left 11/17/2019   Procedure: CYSTOSCOPY WITH RETROGRADE PYELOGRAM, URETEROSCOPY AND STENT PLACEMENT;  Surgeon: Franchot Gallo, MD;  Location: [facility];  Service: Urology;  Laterality: Left;  90 MINS  . CYSTOSCOPY WITH RETROGRADE PYELOGRAM, URETEROSCOPY AND STENT PLACEMENT Left 05/10/2020   Procedure: CYSTOSCOPY WITH RETROGRADE PYELOGRAM, URETEROSCOPY AND STENT PLACEMENT WITH URETHRAL DIALATION AND BRUSH BIOPSY;  Surgeon: Franchot Gallo, MD;  Location: [facility];  Service: Urology;  Laterality: Left;  1 HR  . kidney removed     right   . RIGHT/LEFT HEART CATH AND CORONARY ANGIOGRAPHY N/A 11/06/2019   Procedure: RIGHT/LEFT HEART CATH AND CORONARY ANGIOGRAPHY;  Surgeon: Belva Crome, MD;  Location: [place] [facility];  Service: Cardiovascular;  Laterality: N/A;  . THULIUM LASER TURP (TRANSURETHRAL RESECTION OF PROSTATE) Left 08/18/2019   Procedure: THULIUM LASER ABLATION OF URETERAL TUMOR;  Surgeon: Franchot Gallo, MD;  [facility];  Service: Urology;  Laterality: Left;  .  THULIUM LASER TURP (TRANSURETHRAL RESECTION OF PROSTATE) Left 11/17/2019   Procedure: THULIUM LASER of URETERAL CANCER;  Surgeon: Franchot Gallo, MD;  Location: [facility];  Service: Urology;  Laterality: Left;    Current Medications: Current Meds  Medication Sig  . acetaminophen (TYLENOL) 500 MG tablet Take 500 mg by mouth every 6 (six) hours as needed (for pain.).  Marland Kitchen aspirin EC 81 MG tablet Take 1 tablet (81 mg total) by mouth daily.  . calcium carbonate (TUMS - DOSED IN MG ELEMENTAL CALCIUM) 500 MG chewable tablet Chew 1 tablet by mouth daily.  . carvedilol (COREG) 6.25 MG tablet Take 1 tablet (6.25 mg total) by mouth 2 (two) times daily with a meal.  . hydrALAZINE (APRESOLINE) 50 MG tablet TAKE 1 TABLET(50 MG) BY MOUTH EVERY 8 HOURS  . oxybutynin (DITROPAN) 5 MG tablet Take 5 mg by mouth every 6 (six) hours as needed.  . sertraline (ZOLOFT) 50 MG tablet Take 50 mg by mouth daily.  . simvastatin (ZOCOR) 20 MG tablet Take 20 mg by mouth daily.  Marland Kitchen spironolactone (ALDACTONE) 25 MG tablet TAKE 1 TABLET(25 MG) BY MOUTH DAILY  . traZODone (DESYREL) 150 MG tablet Take 150 mg by mouth at bedtime.  . [DISCONTINUED] isosorbide mononitrate (IMDUR) 30 MG 24 hr tablet TAKE 1 TABLET(30 MG) BY MOUTH DAILY  . [DISCONTINUED] traZODone (DESYREL) 50 MG tablet Take 100 mg by mouth at bedtime.     Allergies:   Erythromycin and Lotensin [benazepril hcl]   Social History   Socioeconomic History  . Marital status: Married    Spouse name: Not on file  . Number of children: Not on file  . Years of education: Not on file  . Highest education level: Not on file  Occupational History  . Not on file  Tobacco Use  . Smoking status: Former Smoker    Packs/day: 0.50    Years: 38.00    Pack years: 19.00    Types: Cigarettes    Quit date: 11/26/2003    Years since quitting: 16.7  . Smokeless tobacco: Never Used  Vaping Use  . Vaping Use: Never used  Substance and Sexual Activity  .  Alcohol use: No    Alcohol/week: 0.0 standard drinks  . Drug use: No  . Sexual activity: Not on file  Other Topics Concern  . Not on file  Social History Narrative  . Not on file   Social Determinants of Health   Financial Resource Strain:   . Difficulty of Paying Living Expenses: Not on file  Food Insecurity:   . Worried About Charity fundraiser in the Last Year: Not on file  . Ran Out of Food in the Last Year: Not on file  Transportation Needs:   . Lack of Transportation (Medical): Not on file  . Lack of Transportation (Non-Medical): Not on file  Physical Activity:   . Days of Exercise per Week: Not on file  .  Minutes of Exercise per Session: Not on file  Stress:   . Feeling of Stress : Not on file  Social Connections:   . Frequency of Communication with Friends and Family: Not on file  . Frequency of Social Gatherings with Friends and Family: Not on file  . Attends Religious Services: Not on file  . Active Member of Clubs or Organizations: Not on file  . Attends Archivist Meetings: Not on file  . Marital Status: Not on file     Family History: The patient's family history includes Diabetes Mellitus II in her sister; Hypertension in her brother, mother, and sister.  ROS:   Please see the history of present illness.     All other systems reviewed and are negative.  EKGs/Labs/Other Studies Reviewed:    The following studies were reviewed today:  EKG:     Recent Labs: 11/04/2019: Platelets 280 05/10/2020: BUN 16; Creatinine, Ser 1.40; Hemoglobin 14.6; Potassium 3.7; Sodium 141  Recent Lipid Panel    Component Value Date/Time   CHOL 150 07/01/2019 0443   TRIG 117 07/01/2019 0443   HDL 47 07/01/2019 0443   CHOLHDL 3.2 07/01/2019 0443   VLDL 23 07/01/2019 0443   LDLCALC 80 07/01/2019 0443     Risk Assessment/Calculations:       Physical Exam:    VS:  BP 106/64   Pulse 60   Ht 5' 3"  (1.6 m)   Wt 170 lb 12.8 oz (77.5 kg)   SpO2 95%   BMI  30.26 kg/m     Wt Readings from Last 3 Encounters:  08/24/20 170 lb 12.8 oz (77.5 kg)  05/10/20 170 lb 3.2 oz (77.2 kg)  02/06/20 175 lb (79.4 kg)     GEN:  Well nourished, well developed in no acute distress HEENT: Normal NECK: No JVD; No carotid bruits LYMPHATICS: No lymphadenopathy CARDIAC: RRR, no murmurs, rubs, gallops RESPIRATORY:  Clear to auscultation without rales, wheezing or rhonchi  ABDOMEN: Soft, non-tender, non-distended MUSCULOSKELETAL:  No edema; No deformity  SKIN: Warm and dry NEUROLOGIC:  Alert and oriented x 3 PSYCHIATRIC:  Normal affect   ASSESSMENT:    No diagnosis found. PLAN:    In order of problems listed above:  1. CHF :   [name] has mild CHF .   Also has mild - mod CKD.   Is on hydralazine, Imdur and spiro. She has had some dizziness related to the addition of the spironolactone. Her blood pressure is on the low side.  We will discontinue the isosorbide and continue with hydralazine and spironolactone.  She is scheduled to have a ureteral stent placement/removal in several weeks.  She is at low risk for having this procedure.  She is optimally tuned up.  It will be okay for her to stop her aspirin for 5 to 7 days prior to the stent procedure.  She will see an APP in 3 months   Since I am now part-time we will reassign her to Dr. Gasper Sells  I will address any issues that arise before she has had a chance to meet with Dr. Gasper Sells.     Shared Decision Making/Informed Consent       Medication Adjustments/Labs and Tests Ordered: Current medicines are reviewed at length with the patient today.  Concerns regarding medicines are outlined above.  No orders of the defined types were placed in this encounter.  No orders of the defined types were placed in this encounter.   Patient Instructions  Medication  Instructions:  STOP IMDUR *If you need a refill on your cardiac medications before your next appointment, please call your  pharmacy*   Lab Work: none If you have labs (blood work) drawn today and your tests are completely normal, you will receive your results only by: Marland Kitchen MyChart Message (if you have MyChart) OR . A paper copy in the mail If you have any lab test that is abnormal or we need to change your treatment, we will call you to review the results.   Testing/Procedures: none   Follow-Up: At [facility], you and your health needs are our priority.  As part of our continuing mission to provide you with exceptional heart care, we have created designated Provider Care Teams.  These Care Teams include your primary Cardiologist (physician) and Advanced Practice Providers (APPs -  Physician Assistants and Nurse Practitioners) who all work together to provide you with the care you need, when you need it.  We recommend signing up for the patient portal called "MyChart".  Sign up information is provided on this After Visit Summary.  MyChart is used to connect with patients for Virtual Visits (Telemedicine).  Patients are able to view lab/test results, encounter notes, upcoming appointments, etc.  Non-urgent messages can be sent to your provider as well.   To learn more about what you can do with MyChart, go to [email or web address].    Your next appointment:   3 month(s)  The format for your next appointment:   In Person  Provider:   You will see one of the following Advanced Practice Providers on your designated Care Team:    Richardson Dopp, PA-C  Robbie Lis, [place]  Then, will plan for you to see Dr. Gasper Sells therafter.   Other Instructions     Signed, Mertie Moores, MD  08/24/2020 4:48 PM    [facility]

## 2020-08-24 ENCOUNTER — Encounter: Payer: Self-pay | Admitting: Cardiovascular Disease

## 2020-08-24 ENCOUNTER — Other Ambulatory Visit: Payer: Self-pay

## 2020-08-24 ENCOUNTER — Ambulatory Visit (INDEPENDENT_AMBULATORY_CARE_PROVIDER_SITE_OTHER): Payer: Medicare PPO | Admitting: Cardiovascular Disease

## 2020-08-24 DIAGNOSIS — I5042 Chronic combined systolic (congestive) and diastolic (congestive) heart failure: Secondary | ICD-10-CM | POA: Insufficient documentation

## 2020-08-24 NOTE — Patient Instructions (Signed)
Medication Instructions:  STOP IMDUR *If you need a refill on your cardiac medications before your next appointment, please call your pharmacy*   Lab Work: none If you have labs (blood work) drawn today and your tests are completely normal, you will receive your results only by: Marland Kitchen MyChart Message (if you have MyChart) OR . A paper copy in the mail If you have any lab test that is abnormal or we need to change your treatment, we will call you to review the results.   Testing/Procedures: none   Follow-Up: At [facility], you and your health needs are our priority.  As part of our continuing mission to provide you with exceptional heart care, we have created designated Provider Care Teams.  These Care Teams include your primary Cardiologist (physician) and Advanced Practice Providers (APPs -  Physician Assistants and Nurse Practitioners) who all work together to provide you with the care you need, when you need it.  We recommend signing up for the patient portal called "MyChart".  Sign up information is provided on this After Visit Summary.  MyChart is used to connect with patients for Virtual Visits (Telemedicine).  Patients are able to view lab/test results, encounter notes, upcoming appointments, etc.  Non-urgent messages can be sent to your provider as well.   To learn more about what you can do with MyChart, go to [email or web address].    Your next appointment:   3 month(s)  The format for your next appointment:   In Person  Provider:   You will see one of the following Advanced Practice Providers on your designated Care Team:    Richardson Dopp, PA-C  Robbie Lis, [place]  Then, will plan for you to see Dr. Gasper Sells therafter.   Other Instructions

## 2020-09-08 DIAGNOSIS — U071 COVID-19: Secondary | ICD-10-CM

## 2020-09-08 HISTORY — DX: COVID-19: U07.1

## 2020-09-09 ENCOUNTER — Other Ambulatory Visit: Payer: Self-pay

## 2020-09-09 ENCOUNTER — Encounter (HOSPITAL_BASED_OUTPATIENT_CLINIC_OR_DEPARTMENT_OTHER): Payer: Self-pay | Admitting: Urology

## 2020-09-09 NOTE — Progress Notes (Addendum)
Spoke w/ via phone for pre-op interview---pt Lab needs dos----     I stat 8           COVID test ------09-13-2020 at [place] at -------530 am 09-16-2020 NPO after MN NO Solid Food.  Clear liquids from MN until---430 am then npo Medications to take morning of surgery -----sertraline, simvastatin, hydrazaline, carvedilol, oxybutynin Diabetic medication ----n/a- Patient Special Instructions -----none Pre-Op special Istructions -----none Patient verbalized understanding of instructions that were given at this phone interview. Patient denies shortness of breath, chest pain, fever, cough at this phone interview.  Anesthesia Review: no moderate cad, mild chf, has cardiac clearance  PCP: dr Sela Hilding Cardiologist :dr Cathie Olden lov/cardiac clearance 08-24-2020 epic/chart Chest x-ray : none EKG : 11-04-2019 epic Echo : 06-28-2019 [facility] Stress test:06-30-2019 [facility] Cardiac Cath : 11-06-2019 epic Activity level:  Climbs flight of steps without difficulty, does own housework Sleep Study/ CPAP :none Fasting Blood Sugar :      / Checks Blood Sugar -- times a day:  n/a Blood Thinner/ Instructions /Last Dose:n/a ASA / Instructions/ Last Dose : per pt stopping 81 mg aspirin 09-12-2020 , has note to stop 81 mg aspirin 5-7 days per dr Cathie Olden note 08-24-2020 chart/epic

## 2020-09-13 ENCOUNTER — Other Ambulatory Visit (HOSPITAL_COMMUNITY)
Admission: RE | Admit: 2020-09-13 | Discharge: 2020-09-13 | Disposition: A | Discharge status: Home / Self Care | Payer: Medicare PPO | Source: Ambulatory Visit | Attending: Urology | Admitting: Urology

## 2020-09-13 DIAGNOSIS — Z20822 Contact with and (suspected) exposure to covid-19: Secondary | ICD-10-CM | POA: Insufficient documentation

## 2020-09-13 DIAGNOSIS — Z01812 Encounter for preprocedural laboratory examination: Secondary | ICD-10-CM | POA: Diagnosis not present

## 2020-09-13 LAB — SARS CORONAVIRUS 2 (TAT 6-24 HRS): SARS Coronavirus 2: NEGATIVE

## 2020-09-15 NOTE — H&P (Signed)
H&P  Chief Complaint: Cancer in left ureter  History of Present Illness: [name] is a 74 y.o. year old female with longstanding history of urothelial carcinoma.  She underwent right nephro ureterectomy for right sided ureteral urothelial carcinoma several years ago.  At that time she had a bladder tumor resected as well.  She has not had recurrence in her bladder but in 2020 she was found to have urothelial carcinoma in her left distal ureter.  This has been treated intermittently with laser ablation and stent placement.  She presents at this time for repeat procedure.  Past Medical History:  Diagnosis Date  . Anxiety   . CAD in native artery    a. cath 10/2019- mild to moderate dx >> medical therapy   . Cancer [facility])    left ureteral cancer dx sept 2020  . CHF (congestive heart failure) ([place])   . Chronic kidney disease 06/2019   acute renal insufficiency sees dr Dorina Hoyer, ckd stage 3 per pt  . Depression   . GERD (gastroesophageal reflux disease)   . Hyperlipidemia   . Hypertension     Past Surgical History:  Procedure Laterality Date  . ABDOMINAL HYSTERECTOMY     partial  . bladder-partial removal    . CYSTOSCOPY W/ URETERAL STENT PLACEMENT Left 11/17/2019   Procedure: CYSTOSCOPY WITH STENT REPLACEMENT;  Surgeon: Franchot Gallo, MD;  [facility];  Service: Urology;  Laterality: Left;  . CYSTOSCOPY W/ URETERAL STENT REMOVAL Left 05/10/2020   Procedure: CYSTOSCOPY WITH STENT REMOVAL;  Surgeon: Franchot Gallo, MD;  Location: [facility];  Service: Urology;  Laterality: Left;  . CYSTOSCOPY WITH RETROGRADE PYELOGRAM, URETEROSCOPY AND STENT PLACEMENT Left 06/27/2019   Procedure: CYSTOSCOPY WITH LEFT RETROGRADE PYELOGRAM, URETEROSCOPY, BIOPSY AND LEFT STENT PLACEMENT;  Surgeon: Irine Seal, MD;  [facility];  Service: Urology;  Laterality: Left;  . CYSTOSCOPY WITH RETROGRADE PYELOGRAM, URETEROSCOPY AND STENT PLACEMENT Left 08/18/2019    Procedure: CYSTOSCOPY, URETEROSCOPY AND STENT EXCHANGE;  Surgeon: Franchot Gallo, MD;  [facility];  Service: Urology;  Laterality: Left;  90 MINS  . CYSTOSCOPY WITH RETROGRADE PYELOGRAM, URETEROSCOPY AND STENT PLACEMENT Left 11/17/2019   Procedure: CYSTOSCOPY WITH RETROGRADE PYELOGRAM, URETEROSCOPY AND STENT PLACEMENT;  Surgeon: Franchot Gallo, MD;  Location: [facility];  Service: Urology;  Laterality: Left;  90 MINS  . CYSTOSCOPY WITH RETROGRADE PYELOGRAM, URETEROSCOPY AND STENT PLACEMENT Left 05/10/2020   Procedure: CYSTOSCOPY WITH RETROGRADE PYELOGRAM, URETEROSCOPY AND STENT PLACEMENT WITH URETHRAL DIALATION AND BRUSH BIOPSY;  Surgeon: Franchot Gallo, MD;  Location: [facility];  Service: Urology;  Laterality: Left;  1 HR  . kidney removed  2006   right   . RIGHT/LEFT HEART CATH AND CORONARY ANGIOGRAPHY N/A 11/06/2019   Procedure: RIGHT/LEFT HEART CATH AND CORONARY ANGIOGRAPHY;  Surgeon: Belva Crome, MD;  Location: [place] [facility];  Service: Cardiovascular;  Laterality: N/A;  . THULIUM LASER TURP (TRANSURETHRAL RESECTION OF PROSTATE) Left 08/18/2019   Procedure: THULIUM LASER ABLATION OF URETERAL TUMOR;  Surgeon: Franchot Gallo, MD;  [facility];  Service: Urology;  Laterality: Left;  . THULIUM LASER TURP (TRANSURETHRAL RESECTION OF PROSTATE) Left 11/17/2019   Procedure: THULIUM LASER of URETERAL CANCER;  Surgeon: Franchot Gallo, MD;  Location: [facility];  Service: Urology;  Laterality: Left;    Home Medications:  No medications prior to admission.    Allergies:  Allergies  Allergen Reactions  . Erythromycin Diarrhea and Nausea And Vomiting  .  Lotensin [Benazepril Hcl] Cough    Family History  Problem Relation Age of Onset  . Hypertension Mother   . Diabetes Mellitus II Sister   . Hypertension Sister   . Hypertension Brother     Social History:  reports that she quit smoking about 16 years ago. Her  smoking use included cigarettes. She has a 19.00 pack-year smoking history. She has never used smokeless tobacco. She reports that she does not drink alcohol and does not use drugs.  ROS: A complete review of systems was performed.  All systems are negative except for pertinent findings as noted.  Physical Exam:  Vital signs in last 24 hours:   General:  Alert and oriented, No acute distress HEENT: Normocephalic, atraumatic Neck: No JVD or lymphadenopathy Cardiovascular: Regular rate  Lungs: Normal inspiratory/expiratory excursion Abdomen: Soft, nontender, nondistended, no abdominal masses Back: No CVA tenderness Extremities: No edema Neurologic: Grossly intact  Laboratory Data:  No results found for this or any previous visit (from the past 24 hour(s)). Recent Results (from the past 240 hour(s))  SARS CORONAVIRUS 2 (TAT 6-24 HRS) Nasopharyngeal Nasopharyngeal Swab     Status: None   Collection Time: 09/13/20  9:24 AM   Specimen: Nasopharyngeal Swab  Result Value Ref Range Status   SARS Coronavirus 2 NEGATIVE NEGATIVE Final    Comment: (NOTE) SARS-CoV-2 target nucleic acids are NOT DETECTED.  The SARS-CoV-2 RNA is generally detectable in upper and lower respiratory specimens during the acute phase of infection. Negative results do not preclude SARS-CoV-2 infection, do not rule out co-infections with other pathogens, and should not be used as the sole basis for treatment or other patient management decisions. Negative results must be combined with clinical observations, patient history, and epidemiological information. The expected result is Negative.  Fact Sheet for Patients: SugarRoll.be  Fact Sheet for Healthcare Providers: https://www.woods-mathews.com/  This test is not yet approved or cleared by the [place] FDA and  has been authorized for detection and/or diagnosis of SARS-CoV-2 by FDA under an Emergency Use  Authorization (EUA). This EUA will remain  in effect (meaning this test can be used) for the duration of the COVID-19 declaration under Se ction 564(b)(1) of the Act, 21 U.S.C. section 360bbb-3(b)(1), unless the authorization is terminated or revoked sooner.  Performed at [facility], [place] [address], [place], [place] [address]    Creatinine: No results for input(s): CREATININE in the last 168 hours.  Radiologic Imaging: No results found.  Impression/Assessment:  Urothelial carcinoma of left distal ureter and solitary left kidney, low-grade  Plan:  Cystoscopy, left double-J stent extraction, retrograde study of the left ureter, left ureteroscopy and possible holmium laser treatment with stent replacement  Lillette Boxer Mathhew Buysse 09/15/2020, 8:31 PM  Lillette Boxer. Azai Gaffin MD

## 2020-09-16 ENCOUNTER — Encounter (HOSPITAL_BASED_OUTPATIENT_CLINIC_OR_DEPARTMENT_OTHER): Payer: Self-pay | Admitting: Urology

## 2020-09-16 ENCOUNTER — Ambulatory Visit (HOSPITAL_BASED_OUTPATIENT_CLINIC_OR_DEPARTMENT_OTHER): Payer: Medicare PPO | Admitting: Certified Registered"

## 2020-09-16 ENCOUNTER — Ambulatory Visit (HOSPITAL_BASED_OUTPATIENT_CLINIC_OR_DEPARTMENT_OTHER)
Admission: RE | Admit: 2020-09-16 | Discharge: 2020-09-16 | Disposition: A | Discharge status: Home / Self Care | Payer: Medicare PPO | Attending: Urology | Admitting: Urology

## 2020-09-16 ENCOUNTER — Encounter (HOSPITAL_BASED_OUTPATIENT_CLINIC_OR_DEPARTMENT_OTHER)
Admission: RE | Disposition: A | Discharge status: Home / Self Care | Payer: Self-pay | Source: Home / Self Care | Attending: Urology

## 2020-09-16 DIAGNOSIS — R897 Abnormal histological findings in specimens from other organs, systems and tissues: Secondary | ICD-10-CM | POA: Diagnosis not present

## 2020-09-16 DIAGNOSIS — I110 Hypertensive heart disease with heart failure: Secondary | ICD-10-CM | POA: Diagnosis not present

## 2020-09-16 DIAGNOSIS — Z8551 Personal history of malignant neoplasm of bladder: Secondary | ICD-10-CM | POA: Insufficient documentation

## 2020-09-16 DIAGNOSIS — Z8554 Personal history of malignant neoplasm of ureter: Secondary | ICD-10-CM | POA: Diagnosis not present

## 2020-09-16 DIAGNOSIS — C662 Malignant neoplasm of left ureter: Secondary | ICD-10-CM | POA: Diagnosis not present

## 2020-09-16 DIAGNOSIS — Z87891 Personal history of nicotine dependence: Secondary | ICD-10-CM | POA: Diagnosis not present

## 2020-09-16 DIAGNOSIS — D4121 Neoplasm of uncertain behavior of right ureter: Secondary | ICD-10-CM | POA: Diagnosis not present

## 2020-09-16 DIAGNOSIS — Z906 Acquired absence of other parts of urinary tract: Secondary | ICD-10-CM | POA: Insufficient documentation

## 2020-09-16 DIAGNOSIS — Z905 Acquired absence of kidney: Secondary | ICD-10-CM | POA: Diagnosis not present

## 2020-09-16 DIAGNOSIS — I2510 Atherosclerotic heart disease of native coronary artery without angina pectoris: Secondary | ICD-10-CM | POA: Diagnosis not present

## 2020-09-16 DIAGNOSIS — I5042 Chronic combined systolic (congestive) and diastolic (congestive) heart failure: Secondary | ICD-10-CM | POA: Diagnosis not present

## 2020-09-16 HISTORY — PX: CYSTOSCOPY WITH RETROGRADE PYELOGRAM, URETEROSCOPY AND STENT PLACEMENT: SHX5789

## 2020-09-16 HISTORY — DX: Gastro-esophageal reflux disease without esophagitis: K21.9

## 2020-09-16 HISTORY — PX: HOLMIUM LASER APPLICATION: SHX5852

## 2020-09-16 LAB — POCT I-STAT, CHEM 8
BUN: 24 mg/dL — ABNORMAL HIGH (ref 8–23)
Calcium, Ion: 1.22 mmol/L (ref 1.15–1.40)
Chloride: 100 mmol/L (ref 98–111)
Creatinine, Ser: 1.1 mg/dL — ABNORMAL HIGH (ref 0.44–1.00)
Glucose, Bld: 105 mg/dL — ABNORMAL HIGH (ref 70–99)
HCT: 42 % (ref 36.0–46.0)
Hemoglobin: 14.3 g/dL (ref 12.0–15.0)
Potassium: 4.3 mmol/L (ref 3.5–5.1)
Sodium: 139 mmol/L (ref 135–145)
TCO2: 31 mmol/L (ref 22–32)

## 2020-09-16 SURGERY — CYSTOURETEROSCOPY, WITH RETROGRADE PYELOGRAM AND STENT INSERTION
Anesthesia: General | Site: Renal | Laterality: Left

## 2020-09-16 MED ORDER — PROPOFOL 10 MG/ML IV BOLUS
INTRAVENOUS | Status: DC | PRN
  Administered 2020-09-16: 100 mg via INTRAVENOUS

## 2020-09-16 MED ORDER — SODIUM CHLORIDE 0.9 % IR SOLN
Status: DC | PRN
  Administered 2020-09-16: 3000 mL

## 2020-09-16 MED ORDER — ONDANSETRON HCL 4 MG/2ML IJ SOLN
INTRAMUSCULAR | Status: AC
  Filled 2020-09-16: qty 2

## 2020-09-16 MED ORDER — WHITE PETROLATUM EX OINT
TOPICAL_OINTMENT | CUTANEOUS | Status: AC
  Filled 2020-09-16: qty 5

## 2020-09-16 MED ORDER — PROPOFOL 10 MG/ML IV BOLUS
INTRAVENOUS | Status: AC
  Filled 2020-09-16: qty 20

## 2020-09-16 MED ORDER — FENTANYL CITRATE (PF) 100 MCG/2ML IJ SOLN: 25.0000 ug | INTRAMUSCULAR | Status: DC | PRN

## 2020-09-16 MED ORDER — DEXAMETHASONE SODIUM PHOSPHATE 10 MG/ML IJ SOLN
INTRAMUSCULAR | Status: AC
  Filled 2020-09-16: qty 1

## 2020-09-16 MED ORDER — FENTANYL CITRATE (PF) 100 MCG/2ML IJ SOLN
INTRAMUSCULAR | Status: AC
  Filled 2020-09-16: qty 2

## 2020-09-16 MED ORDER — EPHEDRINE SULFATE-NACL 50-0.9 MG/10ML-% IV SOSY
PREFILLED_SYRINGE | INTRAVENOUS | Status: DC | PRN
  Administered 2020-09-16 (×2): 10 mg via INTRAVENOUS

## 2020-09-16 MED ORDER — OXYCODONE HCL 5 MG PO TABS
ORAL_TABLET | ORAL | Status: AC
  Filled 2020-09-16: qty 1

## 2020-09-16 MED ORDER — OXYCODONE HCL 5 MG PO TABS
5.0000 mg | ORAL_TABLET | Freq: Once | ORAL | Status: AC
  Administered 2020-09-16: 5 mg via ORAL

## 2020-09-16 MED ORDER — PHENYLEPHRINE 40 MCG/ML (10ML) SYRINGE FOR IV PUSH (FOR BLOOD PRESSURE SUPPORT)
PREFILLED_SYRINGE | INTRAVENOUS | Status: AC
  Filled 2020-09-16: qty 10

## 2020-09-16 MED ORDER — SULFAMETHOXAZOLE-TRIMETHOPRIM 800-160 MG PO TABS: 1.0000 | ORAL_TABLET | Freq: Two times a day (BID) | ORAL | 0 refills | Status: DC

## 2020-09-16 MED ORDER — ONDANSETRON HCL 4 MG/2ML IJ SOLN
INTRAMUSCULAR | Status: DC | PRN
  Administered 2020-09-16: 4 mg via INTRAVENOUS

## 2020-09-16 MED ORDER — EPHEDRINE 5 MG/ML INJ
INTRAVENOUS | Status: AC
  Filled 2020-09-16: qty 10

## 2020-09-16 MED ORDER — ACETAMINOPHEN 500 MG PO TABS
1000.0000 mg | ORAL_TABLET | Freq: Once | ORAL | Status: AC
  Administered 2020-09-16: 1000 mg via ORAL

## 2020-09-16 MED ORDER — IOHEXOL 300 MG/ML  SOLN
INTRAMUSCULAR | Status: DC | PRN
  Administered 2020-09-16: 17 mL

## 2020-09-16 MED ORDER — LIDOCAINE 2% (20 MG/ML) 5 ML SYRINGE
INTRAMUSCULAR | Status: DC | PRN
  Administered 2020-09-16: 40 mg via INTRAVENOUS

## 2020-09-16 MED ORDER — ACETAMINOPHEN 500 MG PO TABS
ORAL_TABLET | ORAL | Status: AC
  Filled 2020-09-16: qty 2

## 2020-09-16 MED ORDER — CEFAZOLIN SODIUM-DEXTROSE 2-4 GM/100ML-% IV SOLN
INTRAVENOUS | Status: AC
  Filled 2020-09-16: qty 100

## 2020-09-16 MED ORDER — PHENYLEPHRINE 40 MCG/ML (10ML) SYRINGE FOR IV PUSH (FOR BLOOD PRESSURE SUPPORT)
PREFILLED_SYRINGE | INTRAVENOUS | Status: DC | PRN
  Administered 2020-09-16: 80 ug via INTRAVENOUS

## 2020-09-16 MED ORDER — SODIUM CHLORIDE 0.9 % IV SOLN: INTRAVENOUS | Status: DC

## 2020-09-16 MED ORDER — LIDOCAINE HCL (PF) 2 % IJ SOLN
INTRAMUSCULAR | Status: AC
  Filled 2020-09-16: qty 5

## 2020-09-16 MED ORDER — CEFAZOLIN SODIUM-DEXTROSE 2-4 GM/100ML-% IV SOLN: 2.0000 g | INTRAVENOUS | Status: DC

## 2020-09-16 MED ORDER — DEXAMETHASONE SODIUM PHOSPHATE 10 MG/ML IJ SOLN
INTRAMUSCULAR | Status: DC | PRN
  Administered 2020-09-16: 5 mg via INTRAVENOUS

## 2020-09-16 SURGICAL SUPPLY — 33 items
BAG DRAIN URO-CYSTO SKYTR STRL (DRAIN) ×3 IMPLANT
BAG DRN UROCATH (DRAIN) ×2
BASKET ZERO TIP NITINOL 2.4FR (BASKET) IMPLANT
BRUSH URET BIOPSY 3F (UROLOGICAL SUPPLIES) ×3 IMPLANT
BSKT STON RTRVL ZERO TP 2.4FR (BASKET)
CATH INTERMIT  6FR 70CM (CATHETERS) ×3 IMPLANT
CLOTH BEACON ORANGE TIMEOUT ST (SAFETY) ×3 IMPLANT
ELECT REM PT RETURN 9FT ADLT (ELECTROSURGICAL)
ELECTRODE REM PT RTRN 9FT ADLT (ELECTROSURGICAL) IMPLANT
FIBER LASER FLEXIVA 200 (UROLOGICAL SUPPLIES) IMPLANT
FIBER LASER FLEXIVA 365 (UROLOGICAL SUPPLIES) ×3 IMPLANT
GLOVE BIO SURGEON STRL SZ8 (GLOVE) ×3 IMPLANT
GLOVE BIOGEL M 6.5 STRL (GLOVE) ×3 IMPLANT
GLOVE BIOGEL PI IND STRL 7.0 (GLOVE) ×4 IMPLANT
GLOVE BIOGEL PI INDICATOR 7.0 (GLOVE) ×2
GOWN STRL REUS W/ TWL LRG LVL3 (GOWN DISPOSABLE) ×2 IMPLANT
GOWN STRL REUS W/ TWL XL LVL3 (GOWN DISPOSABLE) ×2 IMPLANT
GOWN STRL REUS W/TWL LRG LVL3 (GOWN DISPOSABLE) ×3
GOWN STRL REUS W/TWL XL LVL3 (GOWN DISPOSABLE) ×3
GUIDEWIRE ANG ZIPWIRE 038X150 (WIRE) IMPLANT
GUIDEWIRE STR DUAL SENSOR (WIRE) IMPLANT
IV NS IRRIG 3000ML ARTHROMATIC (IV SOLUTION) ×3 IMPLANT
KIT TURNOVER CYSTO (KITS) ×3 IMPLANT
MANIFOLD NEPTUNE II (INSTRUMENTS) ×3 IMPLANT
NS IRRIG 500ML POUR BTL (IV SOLUTION) IMPLANT
PACK CYSTO (CUSTOM PROCEDURE TRAY) ×3 IMPLANT
SHEATH URETERAL 12FRX35CM (MISCELLANEOUS) IMPLANT
STENT URET 6FRX24 CONTOUR (STENTS) ×3 IMPLANT
TRACTIP FLEXIVA PULS ID 200XHI (Laser) IMPLANT
TRACTIP FLEXIVA PULSE ID 200 (Laser)
TUBE CONNECTING 12X1/4 (SUCTIONS) IMPLANT
TUBING UROLOGY SET (TUBING) ×3 IMPLANT
WATER STERILE IRR 500ML POUR (IV SOLUTION) ×3 IMPLANT

## 2020-09-16 NOTE — Transfer of Care (Signed)
Immediate Anesthesia Transfer of Care Note  Patient: [name]  Procedure(s) Performed: Procedure(s) (LRB): CYSTOSCOPY WITH RETROGRADE PYELOGRAM, URETEROSCOPY ,  LITHOPAXY, LEFT URETERAL BRUSHING, AND STENT REPLACEMENT (Left) HOLMIUM LASER APPLICATION (Left)  Patient Location: PACU  Anesthesia Type: General  Level of Consciousness: awake, oriented, sedated and patient cooperative  Airway & Oxygen Therapy: Patient Spontanous Breathing and Patient connected to face mask oxygen  Post-op Assessment: Report given to PACU RN and Post -op Vital signs reviewed and stable  Post vital signs: Reviewed and stable  Complications: No apparent anesthesia complications  Last Vitals:  Vitals Value Taken Time  BP    Temp    Pulse 69 09/16/20 0819  Resp 11 09/16/20 0819  SpO2 96 % 09/16/20 0819  Vitals shown include unvalidated device data.  Last Pain:  Vitals:   09/16/20 0551  TempSrc: Oral  PainSc: 0-No pain      Patients Stated Pain Goal: 5 (32/95/18 8416)  Complications: No complications documented.

## 2020-09-16 NOTE — Discharge Instructions (Signed)
CYSTOSCOPY HOME CARE INSTRUCTIONS  Activity: Rest for the remainder of the day.  Do not drive or operate equipment today.  You may resume normal activities in one to two days as instructed by your physician.   Meals: Drink plenty of liquids and eat light foods such as gelatin or soup this evening.  You may return to a normal meal plan tomorrow.  Return to Work: You may return to work in one to two days or as instructed by your physician.  Special Instructions / Symptoms: Call your physician if any of these symptoms occur:   -persistent or heavy bleeding  -bleeding which continues after first few urination  -large blood clots that are difficult to pass  -urine stream diminishes or stops completely  -fever equal to or higher than 101 degrees Farenheit.  -cloudy urine with a strong, foul odor  -severe pain  Females should always wipe from front to back after elimination.  You may feel some burning pain when you urinate.  This should disappear with time.  Applying moist heat to the lower abdomen or a hot tub bath may help relieve the pain. \  Follow-Up / Date of Return Visit to Your Physician:  As instructed Call for an appointment to arrange follow-up.   Post Anesthesia Home Care Instructions  Activity: Get plenty of rest for the remainder of the day. A responsible individual must stay with you for 24 hours following the procedure.  For the next 24 hours, DO NOT: -Drive a car -Operate machinery -Drink alcoholic beverages -Take any medication unless instructed by your physician -Make any legal decisions or sign important papers.  Meals: Start with liquid foods such as gelatin or soup. Progress to regular foods as tolerated. Avoid greasy, spicy, heavy foods. If nausea and/or vomiting occur, drink only clear liquids until the nausea and/or vomiting subsides. Call your physician if vomiting continues.  Special Instructions/Symptoms: Your throat may feel dry or sore from the  anesthesia or the breathing tube placed in your throat during surgery. If this causes discomfort, gargle with warm salt water. The discomfort should disappear within 24 hours.  If you had a scopolamine patch placed behind your ear for the management of post- operative nausea and/or vomiting:  1. The medication in the patch is effective for 72 hours, after which it should be removed.  Wrap patch in a tissue and discard in the trash. Wash hands thoroughly with soap and water. 2. You may remove the patch earlier than 72 hours if you experience unpleasant side effects which may include dry mouth, dizziness or visual disturbances. 3. Avoid touching the patch. Wash your hands with soap and water after contact with the patch.        

## 2020-09-16 NOTE — Anesthesia Procedure Notes (Signed)
Procedure Name: LMA Insertion Date/Time: 09/16/2020 7:34 AM Performed by: Suan Halter, CRNA Pre-anesthesia Checklist: Patient identified, Emergency Drugs available, Suction available and Patient being monitored Patient Re-evaluated:Patient Re-evaluated prior to induction Oxygen Delivery Method: Circle system utilized Preoxygenation: Pre-oxygenation with 100% oxygen Induction Type: IV induction Ventilation: Mask ventilation without difficulty LMA: LMA inserted LMA Size: 4.0 Number of attempts: 1 Airway Equipment and Method: Bite block Placement Confirmation: positive ETCO2 Tube secured with: Tape Dental Injury: Teeth and Oropharynx as per pre-operative assessment

## 2020-09-16 NOTE — Op Note (Signed)
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  Postoperative diagnosis: Same  Procedure: Cystoscopy, right double-J stent extraction, right retrograde ureteropyelogram, fluoroscopic interpretation, right ureteroscopy, brushing of right mid/distal ureter, holmium laser management of urothelial carcinoma the right mid ureter, placement of 6 French by 24 cm contour double-J stent with out tether  Surgeon: Manuel Lawhead  Anesthesia: General with LMA  Complications: None  Specimen: Right ureteral brushings, for cytology  Drains: Above-mentioned stent  Estimated blood loss: Less than 5 mL  Indications: 74 year old female with solitary left kidney, status post right nephro ureterectomy for urothelial carcinoma of the right distal ureter.  She also has a history of bladder carcinoma.  In 2020, after presenting with hydronephrosis and renal failure, she was diagnosed with urothelial carcinoma of the right ureter.  With her having a solitary kidney, we have been performing local therapy only for her urothelial carcinoma.  This includes ureteroscopic management with laser every few months.  She presents at this time for repeat procedure.  The patient is aware of the procedure, risks, complications and expected outcomes.  She desires to proceed.  Findings: Urothelium of the bladder was normal.  Right ureteral orifice absent, left normal.  Retrograde study of the left ureter revealed a normal left ureter up until the presacral ureter which was quite narrowed.  Despite a fair amount of pressure with retrograde study, I was unable to get any contrast past this mid ureter.  Ureteroscopic evaluation revealed a normal distal ureter.  There was a 2 cm long area of stricture which was fairly easily passed with the ureteroscope.  There was mild cobblestoning and papillary configuration to the urothelium in this area.  Proximal to this area the ureter was totally normal.  Following  ureteroscopy, retrograde study above this area with the open-ended catheter revealed normal ureteral caliber, no filling defects of the left proximal ureter or pyelocalyceal system.  Description of procedure: The patient was properly identified and marked in the holding area.  She was taken to the operating room where general anesthetic was administered with the LMA.  She was placed in the dorsolithotomy position.  Genitalia and perineum were prepped and draped, proper timeout performed.  21 French panendoscope was advanced into her bladder with circumferential inspection revealing normal urothelium.  The left ureteral orifice was cannulated with the open-ended catheter and retrograde ureteropyelogram performed with Omnipaque with the above-mentioned findings.  Since contrast did not get past the expected area of treatment, I negotiated a guidewire through this area quite easily which curled up in the upper pole calyx.  The open-ended catheter was removed.  I then passed a 6 French dual-lumen semirigid ureteroscope easily up to the presacral ureter.  At this point there was narrowing which was mildly difficult to traverse.  There was the above-mentioned urothelial findings.  Proximal to this, the ureter was normal the entire length.  The ureteroscope was withdrawn to the area of treatment.  I then negotiated a 3 [place] ureteral brush into this area and brushed the urothelium in this area and sent for cytology.  At this point the 365 m laser fiber was passed, and using laser energy set at 0.2 J and 50 Hz, the abnormal urothelium was lightly treated with the holmium laser energy such that no abnormal urothelium was seen at this point.  I took great care to avoid significant deep treatment of the ureter.  Once all abnormal urothelium had been treated, the ureteroscope was removed.  Over top of  the guidewire, once it had been backloaded through the cystoscope, a 6 [place] by 24 cm contour double-J stent with tether  removed was adequately positioned, and deployed once the guidewire was removed.  Good proximal and distal curls were seen using fluoroscopy and cystoscopy, respectively.  The bladder was drained, the scope removed, and the procedure terminated.  The patient was awakened and taken to the PACU in stable condition, having tolerated procedure well.

## 2020-09-16 NOTE — Anesthesia Postprocedure Evaluation (Signed)
Anesthesia Post Note  Patient: [name]  Procedure(s) Performed: CYSTOSCOPY WITH RETROGRADE PYELOGRAM, URETEROSCOPY ,  LITHOPAXY, LEFT URETERAL BRUSHING, AND STENT REPLACEMENT (Left Renal) HOLMIUM LASER APPLICATION (Left Renal)     Patient location during evaluation: PACU Anesthesia Type: General Level of consciousness: awake and alert Pain management: pain level controlled Vital Signs Assessment: post-procedure vital signs reviewed and stable Respiratory status: spontaneous breathing, nonlabored ventilation, respiratory function stable and patient connected to nasal cannula oxygen Cardiovascular status: blood pressure returned to baseline and stable Postop Assessment: no apparent nausea or vomiting Anesthetic complications: no   No complications documented.  Last Vitals:  Vitals:   09/16/20 0900 09/16/20 1044  BP: 136/68 (!) 118/59  Pulse: 67 62  Resp: 14 12  Temp:  36.4 C  SpO2: 93% 91%    Last Pain:  Vitals:   09/16/20 1044  TempSrc: Axillary  PainSc:                  Renika Shiflet L Danyell Shader

## 2020-09-16 NOTE — Interval H&P Note (Signed)
History and Physical Interval Note:  09/16/2020 6:31 AM  [name]  has presented today for surgery, with the diagnosis of LEFT URETERAL CANCER.  The various methods of treatment have been discussed with the patient and family. After consideration of risks, benefits and other options for treatment, the patient has consented to  Procedure(s) with comments: CYSTOSCOPY WITH RETROGRADE PYELOGRAM, URETEROSCOPY AND STENT EXTRACTION (Left) - 1 HR HOLMIUM LASER APPLICATION OF TUMOR (Left) CYSTOSCOPY WITH STENT REPLACEMENT (Left) as a surgical intervention.  The patient's history has been reviewed, patient examined, no change in status, stable for surgery.  I have reviewed the patient's chart and labs.  Questions were answered to the patient's satisfaction.     [name]

## 2020-09-16 NOTE — Anesthesia Preprocedure Evaluation (Addendum)
Anesthesia Evaluation  Patient identified by MRN, date of birth, ID band Patient awake    Reviewed: Allergy & Precautions, NPO status , Patient's Chart, lab work & pertinent test results, reviewed documented beta blocker date and time   Airway Mallampati: I  TM Distance: >3 FB Neck ROM: Full    Dental no notable dental hx. (+) Dental Advisory Given   Pulmonary neg pulmonary ROS, former smoker,    Pulmonary exam normal breath sounds clear to auscultation       Cardiovascular hypertension, Pt. on home beta blockers and Pt. on medications + CAD and +CHF  Normal cardiovascular exam Rhythm:Regular Rate:Normal  HLD  TTE 2020 EF 40-45, mod LVH, diffuse hypokinesis, mod MR  LHC 20201 Normal right heart pressures. The left main is short, and widely patent. Ostial to proximal LAD diffuse 40 to 50% narrowing. A moderate to large diagonal #1 contains 40 to 50% proximal segmental narrowing, mid 75% narrowing after a bifurcation, and more distal 60% narrowing. The circumflex gives 3 obtuse marginal branches. The first moderate-sized obtuse marginal contains ostial eccentric 50 to 60% narrowing. The RCA contains eccentric 30 to 40% mid narrowing. No significant obstruction is noted otherwise. Normal left ventricular systolic function. EF greater than 50%. LVEDP is normal.     Neuro/Psych PSYCHIATRIC DISORDERS Anxiety Depression negative neurological ROS     GI/Hepatic Neg liver ROS, GERD  ,  Endo/Other  negative endocrine ROS  Renal/GU Renal InsufficiencyRenal disease (Cr 1.1, K 4.3)  negative genitourinary   Musculoskeletal negative musculoskeletal ROS (+)   Abdominal   Peds  Hematology negative hematology ROS (+)   Anesthesia Other Findings Left ureteral CA  Reproductive/Obstetrics                            Anesthesia Physical Anesthesia Plan  ASA: III  Anesthesia Plan: General    Post-op Pain Management:    Induction: Intravenous  PONV Risk Score and Plan: 3 and Ondansetron, Dexamethasone and Midazolam  Airway Management Planned: LMA  Additional Equipment:   Intra-op Plan:   Post-operative Plan: Extubation in OR  Informed Consent: I have reviewed the patients History and Physical, chart, labs and discussed the procedure including the risks, benefits and alternatives for the proposed anesthesia with the patient or authorized representative who has indicated his/her understanding and acceptance.     Dental advisory given  Plan Discussed with: CRNA  Anesthesia Plan Comments:         Anesthesia Quick Evaluation

## 2020-09-17 ENCOUNTER — Encounter (HOSPITAL_BASED_OUTPATIENT_CLINIC_OR_DEPARTMENT_OTHER): Payer: Self-pay | Admitting: Urology

## 2020-09-19 LAB — CYTOLOGY - NON PAP

## 2020-10-13 DIAGNOSIS — U071 COVID-19: Secondary | ICD-10-CM | POA: Diagnosis not present

## 2020-11-22 ENCOUNTER — Other Ambulatory Visit: Payer: Self-pay | Admitting: Cardiovascular Disease

## 2020-11-23 ENCOUNTER — Other Ambulatory Visit: Payer: Self-pay

## 2020-11-23 MED ORDER — CARVEDILOL 6.25 MG PO TABS
6.2500 mg | ORAL_TABLET | Freq: Two times a day (BID) | ORAL | 3 refills | Status: DC
Start: 2020-11-23 — End: 2021-04-26

## 2020-11-24 ENCOUNTER — Encounter: Payer: Self-pay | Admitting: Physician Assistant

## 2020-11-24 ENCOUNTER — Ambulatory Visit (INDEPENDENT_AMBULATORY_CARE_PROVIDER_SITE_OTHER): Payer: Medicare Other | Admitting: Physician Assistant

## 2020-11-24 ENCOUNTER — Other Ambulatory Visit: Payer: Self-pay

## 2020-11-24 VITALS — BP 110/58 | HR 70 | Ht 63.0 in | Wt 170.6 lb

## 2020-11-24 DIAGNOSIS — I10 Essential (primary) hypertension: Secondary | ICD-10-CM

## 2020-11-24 DIAGNOSIS — E782 Mixed hyperlipidemia: Secondary | ICD-10-CM

## 2020-11-24 DIAGNOSIS — I2510 Atherosclerotic heart disease of native coronary artery without angina pectoris: Secondary | ICD-10-CM

## 2020-11-24 DIAGNOSIS — I5042 Chronic combined systolic (congestive) and diastolic (congestive) heart failure: Secondary | ICD-10-CM

## 2020-11-24 NOTE — Patient Instructions (Addendum)
Medication Instructions:  Your physician recommends that you continue on your current medications as directed. Please refer to the Current Medication list given to you today.  *If you need a refill on your cardiac medications before your next appointment, please call your pharmacy*   Lab Work: None ordered  If you have labs (blood work) drawn today and your tests are completely normal, you will receive your results only by: Marland Kitchen MyChart Message (if you have MyChart) OR . A paper copy in the mail If you have any lab test that is abnormal or we need to change your treatment, we will call you to review the results.   Testing/Procedures: None ordered   Follow-Up: At [facility], you and your health needs are our priority.  As part of our continuing mission to provide you with exceptional heart care, we have created designated Provider Care Teams.  These Care Teams include your primary Cardiologist (physician) and Advanced Practice Providers (APPs -  Physician Assistants and Nurse Practitioners) who all work together to provide you with the care you need, when you need it.  We recommend signing up for the patient portal called "MyChart".  Sign up information is provided on this After Visit Summary.  MyChart is used to connect with patients for Virtual Visits (Telemedicine).  Patients are able to view lab/test results, encounter notes, upcoming appointments, etc.  Non-urgent messages can be sent to your provider as well.   To learn more about what you can do with MyChart, go to [email or web address].    Your next appointment:   2-3 month(s)  The format for your next appointment:   In Person  Provider:   Rudean Haskell, MD   Other Instructions

## 2020-11-24 NOTE — Progress Notes (Signed)
Cardiology Office Note:    Date:  11/24/2020   ID:  [name], DOB 05-05-1946, MRN [id number]  PCP:  [name], MD  [facility] Cardiologist:  Werner Lean, MD  [place] Electrophysiologist:  None   Chief Complaint: 3 months follow up   History of Present Illness:    [name] is a 75 y.o. female with a hx of moderate CAD (medically managed), chronic systolic heart failure, hypertension, hyperlipidemia, renal insufficiency and hx of left ureter cancer presents for follow-up.  Admitted September 2020 with acute heart failure.  Echocardiogram with mildly reduced LV function to 40 to 45%. Myoview was low risk.  Cardiac cath by Dr. Tamala Julian 11/06/19 showed diffuse mild to moderate disease especially in first diagonal 70%.  No real culprit lesion and medical therapy recommended.  May consider PCI of diagonal if refractory angina and affecting life.  Last seen by Dr. Acie Fredrickson 08/2020. Cleared for ureteral stent placement/removal. She will established care with Dr. Gasper Sells as Dr. Acie Fredrickson going part time.   Patient is here for follow-up.  No complaints.  Denies chest pain, shortness of breath, orthopnea, PND, syncope, lower extremity edema or melena.  Compliant with her medication.  Blood pressure relatively stable 12-130/50-70s.   Past Medical History:  Diagnosis Date  . Anxiety   . CAD in native artery    a. cath 10/2019- mild to moderate dx >> medical therapy   . Cancer [facility])    left ureteral cancer dx sept 2020  . CHF (congestive heart failure) ([place])   . Chronic kidney disease 06/2019   acute renal insufficiency sees dr Dorina Hoyer, ckd stage 3 per pt  . Depression   . GERD (gastroesophageal reflux disease)   . Hyperlipidemia   . Hypertension     Past Surgical History:  Procedure Laterality Date  . ABDOMINAL HYSTERECTOMY     partial  . bladder-partial removal    . CYSTOSCOPY W/ URETERAL STENT PLACEMENT Left 11/17/2019   Procedure:  CYSTOSCOPY WITH STENT REPLACEMENT;  Surgeon: Franchot Gallo, MD;  Location: [facility];  Service: Urology;  Laterality: Left;  . CYSTOSCOPY W/ URETERAL STENT REMOVAL Left 05/10/2020   Procedure: CYSTOSCOPY WITH STENT REMOVAL;  Surgeon: Franchot Gallo, MD;  Location: [facility];  Service: Urology;  Laterality: Left;  . CYSTOSCOPY WITH RETROGRADE PYELOGRAM, URETEROSCOPY AND STENT PLACEMENT Left 06/27/2019   Procedure: CYSTOSCOPY WITH LEFT RETROGRADE PYELOGRAM, URETEROSCOPY, BIOPSY AND LEFT STENT PLACEMENT;  Surgeon: Irine Seal, MD;  Location: [facility];  Service: Urology;  Laterality: Left;  . CYSTOSCOPY WITH RETROGRADE PYELOGRAM, URETEROSCOPY AND STENT PLACEMENT Left 08/18/2019   Procedure: CYSTOSCOPY, URETEROSCOPY AND STENT EXCHANGE;  Surgeon: Franchot Gallo, MD;  Location: [facility];  Service: Urology;  Laterality: Left;  90 MINS  . CYSTOSCOPY WITH RETROGRADE PYELOGRAM, URETEROSCOPY AND STENT PLACEMENT Left 11/17/2019   Procedure: CYSTOSCOPY WITH RETROGRADE PYELOGRAM, URETEROSCOPY AND STENT PLACEMENT;  Surgeon: Franchot Gallo, MD;  Location: [facility];  Service: Urology;  Laterality: Left;  90 MINS  . CYSTOSCOPY WITH RETROGRADE PYELOGRAM, URETEROSCOPY AND STENT PLACEMENT Left 05/10/2020   Procedure: CYSTOSCOPY WITH RETROGRADE PYELOGRAM, URETEROSCOPY AND STENT PLACEMENT WITH URETHRAL DIALATION AND BRUSH BIOPSY;  Surgeon: Franchot Gallo, MD;  Location: [facility];  Service: Urology;  Laterality: Left;  1 HR  . CYSTOSCOPY WITH RETROGRADE PYELOGRAM, URETEROSCOPY AND STENT PLACEMENT Left 09/16/2020   Procedure: CYSTOSCOPY WITH RETROGRADE PYELOGRAM, URETEROSCOPY ,  LITHOPAXY, LEFT URETERAL BRUSHING, AND STENT REPLACEMENT;  Surgeon:  Franchot Gallo, MD;  Location: [facility];  Service: Urology;  Laterality: Left;  . HOLMIUM LASER APPLICATION Left 33/11/9516   Procedure: HOLMIUM LASER APPLICATION;  Surgeon: Franchot Gallo, MD;  Location: [facility];  Service: Urology;  Laterality: Left;  . kidney removed  2006   right   . RIGHT/LEFT HEART CATH AND CORONARY ANGIOGRAPHY N/A 11/06/2019   Procedure: RIGHT/LEFT HEART CATH AND CORONARY ANGIOGRAPHY;  Surgeon: Belva Crome, MD;  Location: [facility];  Service: Cardiovascular;  Laterality: N/A;  . THULIUM LASER TURP (TRANSURETHRAL RESECTION OF PROSTATE) Left 08/18/2019   Procedure: THULIUM LASER ABLATION OF URETERAL TUMOR;  Surgeon: Franchot Gallo, MD;  Location: [facility];  Service: Urology;  Laterality: Left;  . THULIUM LASER TURP (TRANSURETHRAL RESECTION OF PROSTATE) Left 11/17/2019   Procedure: THULIUM LASER of URETERAL CANCER;  Surgeon: Franchot Gallo, MD;  Location: [facility];  Service: Urology;  Laterality: Left;    Current Medications: Current Meds  Medication Sig  . acetaminophen (TYLENOL) 500 MG tablet Take 500 mg by mouth every 6 (six) hours as needed (for pain.).  Marland Kitchen aspirin EC 81 MG tablet Take 1 tablet (81 mg total) by mouth daily.  . calcium carbonate (TUMS - DOSED IN MG ELEMENTAL CALCIUM) 500 MG chewable tablet Chew 1 tablet by mouth daily.  . carvedilol (COREG) 6.25 MG tablet Take 1 tablet (6.25 mg total) by mouth 2 (two) times daily with a meal.  . hydrALAZINE (APRESOLINE) 50 MG tablet TAKE 1 TABLET(50 MG) BY MOUTH EVERY 8 HOURS  . oxybutynin (DITROPAN) 5 MG tablet Take 5 mg by mouth daily.   . sertraline (ZOLOFT) 50 MG tablet Take 50 mg by mouth daily.  . simvastatin (ZOCOR) 20 MG tablet Take 20 mg by mouth daily.  . traZODone (DESYREL) 150 MG tablet Take 150 mg by mouth at bedtime.  . Zinc-Vitamin C (ZINC-A-COLD/VITAMIN C MT) Use as directed 1 tablet in the mouth or throat daily.     Allergies:   Erythromycin and Lotensin [benazepril hcl]   Social History   Socioeconomic History  . Marital status: Married    Spouse name: Not on file  . Number of children: Not on file  . Years of education:  Not on file  . Highest education level: Not on file  Occupational History  . Not on file  Tobacco Use  . Smoking status: Former Smoker    Packs/day: 0.50    Years: 38.00    Pack years: 19.00    Types: Cigarettes    Quit date: 11/26/2003    Years since quitting: 17.0  . Smokeless tobacco: Never Used  Vaping Use  . Vaping Use: Never used  Substance and Sexual Activity  . Alcohol use: No    Alcohol/week: 0.0 standard drinks  . Drug use: No  . Sexual activity: Not on file  Other Topics Concern  . Not on file  Social History Narrative  . Not on file   Social Determinants of Health   Financial Resource Strain: Not on file  Food Insecurity: Not on file  Transportation Needs: Not on file  Physical Activity: Not on file  Stress: Not on file  Social Connections: Not on file     Family History: The patient's family history includes Diabetes Mellitus II in her sister; Hypertension in her brother, mother, and sister.  *  ROS:   Please see the history of present illness.    All other systems reviewed and  are negative.   EKGs/Labs/Other Studies Reviewed:    The following studies were reviewed today:  RIGHT/LEFT HEART CATH AND CORONARY ANGIOGRAPHY    Conclusion    Normal right heart pressures.  The left main is short, and widely patent.  Ostial to proximal LAD diffuse 40 to 50% narrowing. A moderate to large diagonal #1 contains 40 to 50% proximal segmental narrowing, mid 75% narrowing after a bifurcation, and more distal 60% narrowing.  The circumflex gives 3 obtuse marginal branches. The first moderate-sized obtuse marginal contains ostial eccentric 50 to 60% narrowing.  The RCA contains eccentric 30 to 40% mid narrowing. No significant obstruction is noted otherwise.  Normal left ventricular systolic function. EF greater than 50%. LVEDP is normal.  RECOMMENDATIONS:   The patient has moderate coronary disease, especially involving the first diagonal. The  diagonal is relatively diffusely diseased but could be treated with PCI although it perhaps a slightly increased risk of complications due to the diffuse nature of disease. There is no disease that would account for prolonged pain chest pain at rest. If symptoms become nitroglycerin responsive consider diagonal PCI if refractory and impacting quality of life.  Aggressive risk factor modification Diagnostic Dominance: Right     Echo 06/2018 1. Left ventricular ejection fraction, by visual estimation, is 40-45%  with diffuse hypokinesis. The left ventricle has normal function. Normal  left ventricular size. There is moderately increased left ventricular  hypertrophy.  2. Elevated left ventricular end-diastolic pressure.  3. Left ventricular diastolic Doppler parameters are consistent with  impaired relaxation pattern of LV diastolic filling.  4. Global right ventricle has normal systolic function.The right  ventricular size is normal. No increase in right ventricular wall  thickness.  5. Left atrial size was mildly dilated.  6. Right atrial size was normal.  7. Trivial pericardial effusion is present.  8. The mitral valve is normal in structure. Moderate mitral valve  regurgitation. No evidence of mitral stenosis.  9. The tricuspid valve is normal in structure. Tricuspid valve  regurgitation is mild.  10. The aortic valve is normal in structure. Aortic valve regurgitation  was not visualized by color flow Doppler. Mild aortic valve sclerosis  without stenosis.  11. The pulmonic valve was normal in structure. Pulmonic valve  regurgitation is mild by color flow Doppler.  12. The inferior vena cava is normal in size with greater than 50%  respiratory variability, suggesting right atrial pressure of 3 mmHg.  13. Normal pulmonary artery systolic pressure.   EKG:  EKG is ordered today.  The ekg ordered today demonstrates normal sinus rhythm at rate of 70 bpm  Recent  Labs: 09/16/2020: BUN 24; Creatinine, Ser 1.10; Hemoglobin 14.3; Potassium 4.3; Sodium 139  Recent Lipid Panel    Component Value Date/Time   CHOL 150 07/01/2019 0443   TRIG 117 07/01/2019 0443   HDL 47 07/01/2019 0443   CHOLHDL 3.2 07/01/2019 0443   VLDL 23 07/01/2019 0443   LDLCALC 80 07/01/2019 0443    Physical Exam:    VS:  BP (!) 110/58   Pulse 70   Ht 5\' 3"  (1.6 m)   Wt 170 lb 9.6 oz (77.4 kg)   SpO2 95%   BMI 30.22 kg/m     Wt Readings from Last 3 Encounters:  11/24/20 170 lb 9.6 oz (77.4 kg)  09/16/20 172 lb 11.2 oz (78.3 kg)  08/24/20 170 lb 12.8 oz (77.5 kg)     GEN:  Well nourished, well developed in no acute  distress HEENT: Normal NECK: No JVD; No carotid bruits LYMPHATICS: No lymphadenopathy CARDIAC: RRR, no murmurs, rubs, gallops RESPIRATORY:  Clear to auscultation without rales, wheezing or rhonchi  ABDOMEN: Soft, non-tender, non-distended MUSCULOSKELETAL:  No edema; No deformity  SKIN: Warm and dry NEUROLOGIC:  Alert and oriented x 3 PSYCHIATRIC:  Normal affect   ASSESSMENT AND PLAN:    1. CAD No angina.  Continue aspirin, statin and beta-blocker  2.  Hypertension Blood pressure relatively stable.  No change in therapy  3.  Hyperlipidemia -Continue statin  4.  Fall - Without syncope.  No prodromal symptoms.  Advised to discuss with PCP.  Medication Adjustments/Labs and Tests Ordered: Current medicines are reviewed at length with the patient today.  Concerns regarding medicines are outlined above.  Orders Placed This Encounter  Procedures  . EKG 12-Lead   No orders of the defined types were placed in this encounter.   Patient Instructions  Medication Instructions:  Your physician recommends that you continue on your current medications as directed. Please refer to the Current Medication list given to you today.  *If you need a refill on your cardiac medications before your next appointment, please call your pharmacy*   Lab  Work: None ordered  If you have labs (blood work) drawn today and your tests are completely normal, you will receive your results only by: Marland Kitchen MyChart Message (if you have MyChart) OR . A paper copy in the mail If you have any lab test that is abnormal or we need to change your treatment, we will call you to review the results.   Testing/Procedures: None ordered   Follow-Up: At [facility], you and your health needs are our priority.  As part of our continuing mission to provide you with exceptional heart care, we have created designated Provider Care Teams.  These Care Teams include your primary Cardiologist (physician) and Advanced Practice Providers (APPs -  Physician Assistants and Nurse Practitioners) who all work together to provide you with the care you need, when you need it.  We recommend signing up for the patient portal called "MyChart".  Sign up information is provided on this After Visit Summary.  MyChart is used to connect with patients for Virtual Visits (Telemedicine).  Patients are able to view lab/test results, encounter notes, upcoming appointments, etc.  Non-urgent messages can be sent to your provider as well.   To learn more about what you can do with MyChart, go to [email or web address].    Your next appointment:   2-3 month(s)  The format for your next appointment:   In Person  Provider:   Rudean Haskell, MD   Other Instructions      Signed, Leanor Kail, PA  11/24/2020 1:48 PM    [place]

## 2020-11-25 DIAGNOSIS — G4700 Insomnia, unspecified: Secondary | ICD-10-CM | POA: Diagnosis not present

## 2020-11-25 DIAGNOSIS — R5383 Other fatigue: Secondary | ICD-10-CM | POA: Diagnosis not present

## 2020-11-25 DIAGNOSIS — I5022 Chronic systolic (congestive) heart failure: Secondary | ICD-10-CM | POA: Diagnosis not present

## 2020-11-25 DIAGNOSIS — N390 Urinary tract infection, site not specified: Secondary | ICD-10-CM | POA: Diagnosis not present

## 2020-11-25 DIAGNOSIS — R7301 Impaired fasting glucose: Secondary | ICD-10-CM | POA: Diagnosis not present

## 2020-11-25 DIAGNOSIS — I25118 Atherosclerotic heart disease of native coronary artery with other forms of angina pectoris: Secondary | ICD-10-CM | POA: Diagnosis not present

## 2020-11-25 DIAGNOSIS — E7800 Pure hypercholesterolemia, unspecified: Secondary | ICD-10-CM | POA: Diagnosis not present

## 2020-11-25 DIAGNOSIS — I10 Essential (primary) hypertension: Secondary | ICD-10-CM | POA: Diagnosis not present

## 2020-12-17 DIAGNOSIS — Z8551 Personal history of malignant neoplasm of bladder: Secondary | ICD-10-CM | POA: Diagnosis not present

## 2020-12-17 DIAGNOSIS — C669 Malignant neoplasm of unspecified ureter: Secondary | ICD-10-CM | POA: Diagnosis not present

## 2021-01-11 ENCOUNTER — Ambulatory Visit (INDEPENDENT_AMBULATORY_CARE_PROVIDER_SITE_OTHER): Payer: Medicare Other | Admitting: Internal Medicine

## 2021-01-11 ENCOUNTER — Encounter: Payer: Self-pay | Admitting: Internal Medicine

## 2021-01-11 ENCOUNTER — Other Ambulatory Visit: Payer: Self-pay

## 2021-01-11 VITALS — BP 100/60 | HR 73 | Ht 63.0 in | Wt 169.0 lb

## 2021-01-11 DIAGNOSIS — E782 Mixed hyperlipidemia: Secondary | ICD-10-CM

## 2021-01-11 DIAGNOSIS — I5020 Unspecified systolic (congestive) heart failure: Secondary | ICD-10-CM | POA: Diagnosis not present

## 2021-01-11 DIAGNOSIS — I2510 Atherosclerotic heart disease of native coronary artery without angina pectoris: Secondary | ICD-10-CM | POA: Diagnosis not present

## 2021-01-11 DIAGNOSIS — I10 Essential (primary) hypertension: Secondary | ICD-10-CM | POA: Diagnosis not present

## 2021-01-11 MED ORDER — ROSUVASTATIN CALCIUM 5 MG PO TABS: 5.0000 mg | ORAL_TABLET | Freq: Every day | ORAL | 3 refills | Status: DC

## 2021-01-11 NOTE — Patient Instructions (Signed)
Medication Instructions:  Your physician has recommended you make the following change in your medication:  STOP: simvastatin START: rosuvastatin (Crestor) 5 mg by mouth daily  *If you need a refill on your cardiac medications before your next appointment, please call your pharmacy*   Lab Work: IN 3 MONTHS: fasting lipid panel and liver function test If you have labs (blood work) drawn today and your tests are completely normal, you will receive your results only by: Marland Kitchen MyChart Message (if you have MyChart) OR . A paper copy in the mail If you have any lab test that is abnormal or we need to change your treatment, we will call you to review the results.   Testing/Procedures: Your physician has requested that you have an echocardiogram. Echocardiography is a painless test that uses sound waves to create images of your heart. It provides your doctor with information about the size and shape of your heart and how well your heart's chambers and valves are working. This procedure takes approximately one hour. There are no restrictions for this procedure.     Follow-Up: At [facility] you and your health needs are our priority.  As part of our continuing mission to provide you with exceptional heart care, we have created designated Provider Care Teams.  These Care Teams include your primary Cardiologist (physician) and Advanced Practice Providers (APPs -  Physician Assistants and Nurse Practitioners) who all work together to provide you with the care you need, when you need it.  We recommend signing up for the patient portal called "MyChart".  Sign up information is provided on this After Visit Summary.  MyChart is used to connect with patients for Virtual Visits (Telemedicine).  Patients are able to view lab/test results, encounter notes, upcoming appointments, etc.  Non-urgent messages can be sent to your provider as well.   To learn more about what you can do with MyChart, go to  [email or web address].    Your next appointment:   6-7 month(s)  The format for your next appointment:   In Person  Provider:   You may see Werner Lean, MD or one of the following Advanced Practice Providers on your designated Care Team:    Melina Copa, PA-C  Ermalinda Barrios, PA-C    [place]

## 2021-01-11 NOTE — Progress Notes (Signed)
Cardiology Office Note:    Date:  01/11/2021   ID:  [name], DOB 04/22/1946, MRN [id number]  PCP:  [name], MD   [place]  Cardiologist:  Werner Lean, MD  Transition from Dr. Acie Fredrickson Advanced Practice Provider:  No care team member to display Electrophysiologist:  None       CC: follow up HFmrEF  History of Present Illness:    [name] is a 75 y.o. female with a hx of moderate nonobstructive CAD (70% Diag disease 11/05/20), HFmrEF, HTN and HLD, who presents for evaluation 01/11/21.  Oncological History notable for: Malignancies: Urterer Cancer Surgery: R nephreo-urectomy and bladder tumor resection and laser ablation Chemotherapy: None Cessations for Toxicity: N/A Radiation: None Oncology care spearheaded by: Dr. Diona Fanti  Patient notes that she is feeling good.  Has had no chest pain, chest pressure, chest tightness, chest stinging .  Occasional when BP drops that improves with laying down- this is rare.   Patient exertion notable for walking up the stress for a new walking program and feels no symptoms.  No shortness of breath, DOE .  No PND or orthopnea.  No bendopnea, weight gain, leg swelling , or abdominal swelling.  No syncope or near syncope outside of the low blood pressure . Notes  no palpitations or funny heart beats.     Ambulatory BP 124/77 Hr 66.  Logs Reviewed.   Past Medical History:  Diagnosis Date  . Anxiety   . CAD in native artery    a. cath 10/2019- mild to moderate dx >> medical therapy   . Cancer [facility])    left ureteral cancer dx sept 2020  . CHF (congestive heart failure) ([place])   . Chronic kidney disease 06/2019   acute renal insufficiency sees dr Dorina Hoyer, ckd stage 3 per pt  . Depression   . GERD (gastroesophageal reflux disease)   . Hyperlipidemia   . Hypertension     Past Surgical History:  Procedure Laterality Date  . ABDOMINAL HYSTERECTOMY     partial  . bladder-partial  removal    . CYSTOSCOPY W/ URETERAL STENT PLACEMENT Left 11/17/2019   Procedure: CYSTOSCOPY WITH STENT REPLACEMENT;  Surgeon: Franchot Gallo, MD;  Location: [facility];  Service: Urology;  Laterality: Left;  . CYSTOSCOPY W/ URETERAL STENT REMOVAL Left 05/10/2020   Procedure: CYSTOSCOPY WITH STENT REMOVAL;  Surgeon: Franchot Gallo, MD;  Location: [facility];  Service: Urology;  Laterality: Left;  . CYSTOSCOPY WITH RETROGRADE PYELOGRAM, URETEROSCOPY AND STENT PLACEMENT Left 06/27/2019   Procedure: CYSTOSCOPY WITH LEFT RETROGRADE PYELOGRAM, URETEROSCOPY, BIOPSY AND LEFT STENT PLACEMENT;  Surgeon: Irine Seal, MD;  Location: [facility];  Service: Urology;  Laterality: Left;  . CYSTOSCOPY WITH RETROGRADE PYELOGRAM, URETEROSCOPY AND STENT PLACEMENT Left 08/18/2019   Procedure: CYSTOSCOPY, URETEROSCOPY AND STENT EXCHANGE;  Surgeon: Franchot Gallo, MD;  Location: [facility];  Service: Urology;  Laterality: Left;  90 MINS  . CYSTOSCOPY WITH RETROGRADE PYELOGRAM, URETEROSCOPY AND STENT PLACEMENT Left 11/17/2019   Procedure: CYSTOSCOPY WITH RETROGRADE PYELOGRAM, URETEROSCOPY AND STENT PLACEMENT;  Surgeon: Franchot Gallo, MD;  Location: [facility];  Service: Urology;  Laterality: Left;  90 MINS  . CYSTOSCOPY WITH RETROGRADE PYELOGRAM, URETEROSCOPY AND STENT PLACEMENT Left 05/10/2020   Procedure: CYSTOSCOPY WITH RETROGRADE PYELOGRAM, URETEROSCOPY AND STENT PLACEMENT WITH URETHRAL DIALATION AND BRUSH BIOPSY;  Surgeon: Franchot Gallo, MD;  Location: [facility];  Service: Urology;  Laterality: Left;  1 HR  .  CYSTOSCOPY WITH RETROGRADE PYELOGRAM, URETEROSCOPY AND STENT PLACEMENT Left 09/16/2020   Procedure: CYSTOSCOPY WITH RETROGRADE PYELOGRAM, URETEROSCOPY ,  LITHOPAXY, LEFT URETERAL BRUSHING, AND STENT REPLACEMENT;  Surgeon: Franchot Gallo, MD;  Location: [facility];  Service: Urology;  Laterality: Left;  . HOLMIUM LASER APPLICATION  Left 82/06/9370   Procedure: HOLMIUM LASER APPLICATION;  Surgeon: Franchot Gallo, MD;  Location: [facility];  Service: Urology;  Laterality: Left;  . kidney removed  2006   right   . RIGHT/LEFT HEART CATH AND CORONARY ANGIOGRAPHY N/A 11/06/2019   Procedure: RIGHT/LEFT HEART CATH AND CORONARY ANGIOGRAPHY;  Surgeon: Belva Crome, MD;  Location: [facility];  Service: Cardiovascular;  Laterality: N/A;  . THULIUM LASER TURP (TRANSURETHRAL RESECTION OF PROSTATE) Left 08/18/2019   Procedure: THULIUM LASER ABLATION OF URETERAL TUMOR;  Surgeon: Franchot Gallo, MD;  Location: [facility];  Service: Urology;  Laterality: Left;  . THULIUM LASER TURP (TRANSURETHRAL RESECTION OF PROSTATE) Left 11/17/2019   Procedure: THULIUM LASER of URETERAL CANCER;  Surgeon: Franchot Gallo, MD;  Location: [facility];  Service: Urology;  Laterality: Left;    Current Medications: Current Meds  Medication Sig  . acetaminophen (TYLENOL) 500 MG tablet Take 500 mg by mouth every 6 (six) hours as needed (for pain.).  Marland Kitchen Ascorbic Acid (VITAMIN C) 1000 MG tablet daily in the afternoon.  Marland Kitchen aspirin EC 81 MG tablet Take 1 tablet (81 mg total) by mouth daily.  . calcium carbonate (TUMS - DOSED IN MG ELEMENTAL CALCIUM) 500 MG chewable tablet Chew 1 tablet by mouth daily.  . carvedilol (COREG) 6.25 MG tablet Take 1 tablet (6.25 mg total) by mouth 2 (two) times daily with a meal.  . hydrALAZINE (APRESOLINE) 50 MG tablet TAKE 1 TABLET(50 MG) BY MOUTH EVERY 8 HOURS  . rosuvastatin (CRESTOR) 5 MG tablet Take 1 tablet (5 mg total) by mouth daily.  . sertraline (ZOLOFT) 50 MG tablet Take 50 mg by mouth daily.  . traZODone (DESYREL) 150 MG tablet Take 150 mg by mouth at bedtime.  . Zinc-Vitamin C (ZINC-A-COLD/VITAMIN C MT) Use as directed 1 tablet in the mouth or throat daily.  . [DISCONTINUED] oxybutynin (DITROPAN) 5 MG tablet Take 5 mg by mouth daily.   . [DISCONTINUED] simvastatin (ZOCOR) 20 MG  tablet Take 20 mg by mouth daily.     Allergies:   Erythromycin and Lotensin [benazepril hcl]   Social History   Socioeconomic History  . Marital status: Married    Spouse name: Not on file  . Number of children: Not on file  . Years of education: Not on file  . Highest education level: Not on file  Occupational History  . Not on file  Tobacco Use  . Smoking status: Former Smoker    Packs/day: 0.50    Years: 38.00    Pack years: 19.00    Types: Cigarettes    Quit date: 11/26/2003    Years since quitting: 17.1  . Smokeless tobacco: Never Used  Vaping Use  . Vaping Use: Never used  Substance and Sexual Activity  . Alcohol use: No    Alcohol/week: 0.0 standard drinks  . Drug use: No  . Sexual activity: Not on file  Other Topics Concern  . Not on file  Social History Narrative  . Not on file   Social Determinants of Health   Financial Resource Strain: Not on file  Food Insecurity: Not on file  Transportation Needs: Not on file  Physical  Activity: Not on file  Stress: Not on file  Social Connections: Not on file     Family History: The patient's family history includes Diabetes Mellitus II in her sister; Hypertension in her brother, mother, and sister. History of coronary artery disease notable for no members. History of heart failure notable for no members. History of arrhythmia notable for no members.  ROS:   Please see the history of present illness.     All other systems reviewed and are negative.  EKGs/Labs/Other Studies Reviewed:    The following studies were reviewed today:  EKG:   11/24/20: SR 70 WNL  Transthoracic Echocardiogram: Date: 06/18/2019 Results: 1. Left ventricular ejection fraction, by visual estimation, is 40-45%  with diffuse hypokinesis. The left ventricle has normal function. Normal  left ventricular size. There is moderately increased left ventricular  hypertrophy.  2. Elevated left ventricular end-diastolic pressure.  3. Left  ventricular diastolic Doppler parameters are consistent with  impaired relaxation pattern of LV diastolic filling.  4. Global right ventricle has normal systolic function.The right  ventricular size is normal. No increase in right ventricular wall  thickness.  5. Left atrial size was mildly dilated.  6. Right atrial size was normal.  7. Trivial pericardial effusion is present.  8. The mitral valve is normal in structure. Moderate mitral valve  regurgitation. No evidence of mitral stenosis.  9. The tricuspid valve is normal in structure. Tricuspid valve  regurgitation is mild.  10. The aortic valve is normal in structure. Aortic valve regurgitation  was not visualized by color flow Doppler. Mild aortic valve sclerosis  without stenosis.  11. The pulmonic valve was normal in structure. Pulmonic valve  regurgitation is mild by color flow Doppler.  12. The inferior vena cava is normal in size with greater than 50%  respiratory variability, suggesting right atrial pressure of 3 mmHg.  13. Normal pulmonary artery systolic pressure.   ECG or NM Stress Testing : Date: 06/30/19 Results:  There was no ST segment deviation noted during stress.  No T wave inversion was noted during stress.  Defect 1: There is a medium defect of mild severity present in the mid anteroseptal, mid inferolateral, apical anterior, apical septal and apical lateral location.  This is a low risk study.  Nuclear stress EF: 63%.  The left ventricular ejection fraction is normal (55-65%).  No prior study for comparison.   There is a mild to moderate defect extending from mid anteroseptum to inferolateral wall. It improves with stress, which is not consistent with ischemia. Normal wall motion in this area. Not consistent with single coronary distrubution. However, TID is 1.21, which means balanced ischemia in multiple territories. cannot be excluded   Left/Right Heart Catheterizations: Date:  11/06/19 Results:  Normal right heart pressures.  The left main is short, and widely patent.  Ostial to proximal LAD diffuse 40 to 50% narrowing. A moderate to large diagonal #1 contains 40 to 50% proximal segmental narrowing, mid 75% narrowing after a bifurcation, and more distal 60% narrowing.  The circumflex gives 3 obtuse marginal branches. The first moderate-sized obtuse marginal contains ostial eccentric 50 to 60% narrowing.  The RCA contains eccentric 30 to 40% mid narrowing. No significant obstruction is noted otherwise.  Normal left ventricular systolic function. EF greater than 50%. LVEDP is normal.  RECOMMENDATIONS:   The patient has moderate coronary disease, especially involving the first diagonal. The diagonal is relatively diffusely diseased but could be treated with PCI although it perhaps a slightly  increased risk of complications due to the diffuse nature of disease. There is no disease that would account for prolonged pain chest pain at rest. If symptoms become nitroglycerin responsive consider diagonal PCI if refractory and impacting quality of life.  Aggressive risk factor modification    Recent Labs: 09/16/2020: BUN 24; Creatinine, Ser 1.10; Hemoglobin 14.3; Potassium 4.3; Sodium 139  Recent Lipid Panel    Component Value Date/Time   CHOL 150 07/01/2019 0443   TRIG 117 07/01/2019 0443   HDL 47 07/01/2019 0443   CHOLHDL 3.2 07/01/2019 0443   VLDL 23 07/01/2019 0443   LDLCALC 80 07/01/2019 0443   11/25/20 OSH ([facility]) Cholesterol 181  <200  CHOL/HDL 3.3  2.0-4.0  HDLD 54  30-85  Triglyceride 153  0-199  NHDL 126  0-129    Risk Assessment/Calculations:     N/A  Physical Exam:    VS:  BP 100/60   Pulse 73   Ht 5\' 3"  (1.6 m)   Wt 169 lb (76.7 kg)   SpO2 95%   BMI 29.94 kg/m     Wt Readings from Last 3 Encounters:  01/11/21 169 lb (76.7 kg)  11/24/20 170 lb 9.6 oz (77.4 kg)  09/16/20 172 lb 11.2 oz (78.3 kg)     GEN:  Well nourished,  well developed in no acute distress HEENT: Normal NECK: No JVD; No carotid bruits LYMPHATICS: No lymphadenopathy CARDIAC: RRR, no murmurs, rubs, gallops RESPIRATORY:  Clear to auscultation without rales, wheezing or rhonchi  ABDOMEN: Soft, non-tender, non-distended MUSCULOSKELETAL:  No edema; No deformity  SKIN: Warm and dry NEUROLOGIC:  Alert and oriented x 3 PSYCHIATRIC:  Normal affect   ASSESSMENT:    1. CAD in native artery   2. HFrEF (heart failure with reduced ejection fraction) ([place])   3. Mixed hyperlipidemia   4. Essential hypertension    PLAN:    In order of problems listed above:  Coronary Artery Disease: /Nonobstructive HLD - asymptomatic - anatomy: 70% Diag at worse - continue ASA 81 mg - switch to rosuvastatin 5 mg PO Daily, goal LDL < 70 - will get lipids and lfts in three months - continue BB   Heart Failure Mildly Reduced Ejection Fraction  HTN Uretal Cancer, but without chemo or radiation - NYHA class I-II, Stage B, euvolemic, etiology unclear, HTN as a posisibility - Diuretic regimen: None needed - Discussed the importance of fluid restriction of < 2 L, salt restriction, and checking daily weights  - Coreg 6.25 mg PO BID - patient is doing well on hydralazine 50 mg PO TID without QoL issues, can hold dose if return of symptomatic hypotension; we will then reduce dose - TTE ordered  - based on results, may initiation other pillars of GDMT - ambulatory blood pressure at goal, will continue ambulatory BP monitoring; gave education on how to perform ambulatory blood pressure monitoring including the frequency and technique; goal ambulatory blood pressure < 135/85 on average   Fall follow up unless new symptoms or abnormal test results warranting change in plan  Would be reasonable for  APP Follow up    Medication Adjustments/Labs and Tests Ordered: Current medicines are reviewed at length with the patient today.  Concerns regarding medicines are  outlined above.  Orders Placed This Encounter  Procedures  . Lipid panel  . Hepatic function panel  . ECHOCARDIOGRAM COMPLETE   Meds ordered this encounter  Medications  . rosuvastatin (CRESTOR) 5 MG tablet    Sig: Take 1 tablet (  5 mg total) by mouth daily.    Dispense:  90 tablet    Refill:  3    Patient Instructions  Medication Instructions:  Your physician has recommended you make the following change in your medication:  STOP: simvastatin START: rosuvastatin (Crestor) 5 mg by mouth daily  *If you need a refill on your cardiac medications before your next appointment, please call your pharmacy*   Lab Work: IN 3 MONTHS: fasting lipid panel and liver function test If you have labs (blood work) drawn today and your tests are completely normal, you will receive your results only by: Marland Kitchen MyChart Message (if you have MyChart) OR . A paper copy in the mail If you have any lab test that is abnormal or we need to change your treatment, we will call you to review the results.   Testing/Procedures: Your physician has requested that you have an echocardiogram. Echocardiography is a painless test that uses sound waves to create images of your heart. It provides your doctor with information about the size and shape of your heart and how well your heart's chambers and valves are working. This procedure takes approximately one hour. There are no restrictions for this procedure.     Follow-Up: At [facility], you and your health needs are our priority.  As part of our continuing mission to provide you with exceptional heart care, we have created designated Provider Care Teams.  These Care Teams include your primary Cardiologist (physician) and Advanced Practice Providers (APPs -  Physician Assistants and Nurse Practitioners) who all work together to provide you with the care you need, when you need it.  We recommend signing up for the patient portal called "MyChart".  Sign up information  is provided on this After Visit Summary.  MyChart is used to connect with patients for Virtual Visits (Telemedicine).  Patients are able to view lab/test results, encounter notes, upcoming appointments, etc.  Non-urgent messages can be sent to your provider as well.   To learn more about what you can do with MyChart, go to [email or web address].    Your next appointment:   6-7 month(s)  The format for your next appointment:   In Person  Provider:   You may see Werner Lean, MD or one of the following Advanced Practice Providers on your designated Care Team:    Melina Copa, PA-C  Ermalinda Barrios, PA-C    Ot      Signed, Werner Lean, MD  01/11/2021 10:49 AM    [place]

## 2021-02-16 ENCOUNTER — Other Ambulatory Visit: Payer: Self-pay

## 2021-02-16 ENCOUNTER — Ambulatory Visit (HOSPITAL_COMMUNITY): Payer: Medicare Other | Attending: Cardiology

## 2021-02-16 DIAGNOSIS — I5020 Unspecified systolic (congestive) heart failure: Secondary | ICD-10-CM | POA: Insufficient documentation

## 2021-02-16 LAB — ECHOCARDIOGRAM COMPLETE
Area-P 1/2: 2.02 cm2
S' Lateral: 2.7 cm

## 2021-02-25 DIAGNOSIS — Z85828 Personal history of other malignant neoplasm of skin: Secondary | ICD-10-CM | POA: Diagnosis not present

## 2021-02-25 DIAGNOSIS — I8392 Asymptomatic varicose veins of left lower extremity: Secondary | ICD-10-CM | POA: Diagnosis not present

## 2021-02-25 DIAGNOSIS — I8391 Asymptomatic varicose veins of right lower extremity: Secondary | ICD-10-CM | POA: Diagnosis not present

## 2021-02-25 DIAGNOSIS — L720 Epidermal cyst: Secondary | ICD-10-CM | POA: Diagnosis not present

## 2021-02-25 DIAGNOSIS — L821 Other seborrheic keratosis: Secondary | ICD-10-CM | POA: Diagnosis not present

## 2021-02-25 DIAGNOSIS — D225 Melanocytic nevi of trunk: Secondary | ICD-10-CM | POA: Diagnosis not present

## 2021-02-25 DIAGNOSIS — D2271 Melanocytic nevi of right lower limb, including hip: Secondary | ICD-10-CM | POA: Diagnosis not present

## 2021-03-10 ENCOUNTER — Other Ambulatory Visit: Payer: Self-pay | Admitting: Family Medicine

## 2021-03-10 DIAGNOSIS — Z1231 Encounter for screening mammogram for malignant neoplasm of breast: Secondary | ICD-10-CM

## 2021-04-20 DIAGNOSIS — C669 Malignant neoplasm of unspecified ureter: Secondary | ICD-10-CM | POA: Diagnosis not present

## 2021-04-21 ENCOUNTER — Other Ambulatory Visit: Payer: Medicare Other

## 2021-04-21 ENCOUNTER — Other Ambulatory Visit: Payer: Self-pay

## 2021-04-21 DIAGNOSIS — I2510 Atherosclerotic heart disease of native coronary artery without angina pectoris: Secondary | ICD-10-CM | POA: Diagnosis not present

## 2021-04-21 LAB — HEPATIC FUNCTION PANEL
ALT: 12 IU/L (ref 0–32)
AST: 11 IU/L (ref 0–40)
Albumin: 4 g/dL (ref 3.7–4.7)
Alkaline Phosphatase: 61 IU/L (ref 44–121)
Bilirubin Total: <0.2 mg/dL (ref 0.0–1.2)
Bilirubin, Direct: <0.1 mg/dL (ref 0.00–0.40)
Total Protein: 6.8 g/dL (ref 6.0–8.5)

## 2021-04-21 LAB — LIPID PANEL
Chol/HDL Ratio: 3.5 ratio (ref 0.0–4.4)
Cholesterol, Total: 181 mg/dL (ref 100–199)
HDL: 51 mg/dL (ref >39)
LDL Chol Calc (NIH): 103 mg/dL — ABNORMAL HIGH (ref 0–99)
Triglycerides: 156 mg/dL — ABNORMAL HIGH (ref 0–149)
VLDL Cholesterol Cal: 27 mg/dL (ref 5–40)

## 2021-04-22 ENCOUNTER — Other Ambulatory Visit: Payer: Self-pay | Admitting: Urology

## 2021-04-25 ENCOUNTER — Telehealth: Payer: Self-pay | Admitting: Internal Medicine

## 2021-04-25 MED ORDER — ROSUVASTATIN CALCIUM 10 MG PO TABS: 10.0000 mg | ORAL_TABLET | Freq: Every day | ORAL | 3 refills | Status: DC

## 2021-04-25 NOTE — Telephone Encounter (Signed)
  Patient is returning a call for lab results

## 2021-04-25 NOTE — Telephone Encounter (Signed)
Reviewed results and MD recommendations.  Pt is agreeable to plan.  Order placed.  Follow up OV scheduled for 08/10/21 at 8 am pt told to fast prior to appointment for f/u lab work. Pt verbalizes understanding.

## 2021-04-25 NOTE — Telephone Encounter (Signed)
Patient returning call for lab results. 

## 2021-04-25 NOTE — Telephone Encounter (Signed)
                                                                                                                                                                                                                                                                                                                                                                                        Follow Up:      Patient is returning call from Friday, concerning her lab results.

## 2021-04-26 ENCOUNTER — Encounter (HOSPITAL_BASED_OUTPATIENT_CLINIC_OR_DEPARTMENT_OTHER): Payer: Self-pay | Admitting: Urology

## 2021-04-26 ENCOUNTER — Other Ambulatory Visit: Payer: Self-pay

## 2021-04-26 DIAGNOSIS — C678 Malignant neoplasm of overlapping sites of bladder: Secondary | ICD-10-CM | POA: Diagnosis not present

## 2021-04-26 NOTE — Progress Notes (Addendum)
Spoke w/ via phone for pre-op interview---pt Lab needs dos----  I stat             Lab results------ lipids and hepatic function panel 04-21-2021 epic & see below COVID test -----patient states asymptomatic no test needed Arrive at -------600 am 05-02-2021 NPO after MN NO Solid Food.  Clear liquids from MN until---500 am then npo Med rec completed Medications to take morning of surgery -----carvededilol, hydrazaline, rosuvastatin Diabetic medication -----n/a Patient instructed no nail polish to be worn day of surgery Patient instructed to bring photo id and insurance card day of surgery Patient aware to have Driver (ride ) / caregiver   spouse clifton will drop pt off  for 24 hours after surgery  Patient Special Instructions -----none Pre-Op special Istructions -----none Patient verbalized understanding of instructions that were given at this phone interview. Patient denies shortness of breath, chest pain, fever, cough at this phone interview.   Anesthesia Review:mod cad, mild chf, htn stage 3 ckd per pt, pt denies cardiac s & s  or sob at pre op call  PCP:dr Sela Hilding Cardiologist :dr Harrington Challenger 01-11-2021 epic Chest x-ray : 06-28-2019 epic EKG :11-24-2020 epic Echo :02-16-2021 epic Stress test: 06-29-2020 epic Cardiac Cath : 11-06-2019 epic Activity level: climbs flight of steps without difficulty, does own housework Sleep Study/ CPAP :none ASA / Instructions/ Last Dose :  pt stopping 81 mg aspirin 5 days before surgery per [name] instructions

## 2021-04-29 ENCOUNTER — Other Ambulatory Visit: Payer: Self-pay | Admitting: Cardiovascular Disease

## 2021-05-01 NOTE — Anesthesia Preprocedure Evaluation (Addendum)
Anesthesia Evaluation  Patient identified by MRN, date of birth, ID band Patient awake    Reviewed: Allergy & Precautions, NPO status , Patient's Chart, lab work & pertinent test results  Airway Mallampati: II  TM Distance: >3 FB Neck ROM: Full    Dental no notable dental hx. (+) Teeth Intact, Dental Advisory Given   Pulmonary former smoker,    Pulmonary exam normal breath sounds clear to auscultation       Cardiovascular hypertension, Pt. on medications and Pt. on home beta blockers + CAD and +CHF  Normal cardiovascular exam Rhythm:Regular Rate:Normal  02/16/21 1. Left ventricular ejection fraction, by estimation, is 70 to 75%. The  left ventricle has hyperdynamic function. The left ventricle has no  regional wall motion abnormalities. Left ventricular diastolic parameters  are consistent with Grade I diastolic  dysfunction (impaired relaxation).  2. Right ventricular systolic function is normal. The right ventricular  size is normal. There is normal pulmonary artery systolic pressure. The  estimated right ventricular systolic pressure is [id number] mmHg.  3. The mitral valve is normal in structure. No evidence of mitral valve  regurgitation. No evidence of mitral stenosis.  4. The aortic valve is calcified. There is mild calcification of the  aortic valve. There is mild thickening of the aortic valve. Aortic valve  regurgitation is not visualized. Mild aortic valve sclerosis is present,  with no evidence of aortic valve  stenosis.  5. The inferior vena cava is normal in size with greater than 50%  respiratory variability, suggesting right atrial pressure of 3 mmHg.   Neuro/Psych Anxiety negative neurological ROS     GI/Hepatic Neg liver ROS, GERD  ,  Endo/Other  negative endocrine ROS  Renal/GU Renal InsufficiencyRenal diseaseL ureteral CA     Musculoskeletal negative musculoskeletal ROS (+)   Abdominal (+) + obese  (BMI 30.11),   Peds  Hematology   Anesthesia Other Findings All: Erythromycin, Lotensin  Reproductive/Obstetrics                            Anesthesia Physical Anesthesia Plan  ASA: 3  Anesthesia Plan: General   Post-op Pain Management:    Induction: Intravenous  PONV Risk Score and Plan: 4 or greater and Treatment may vary due to age or medical condition, Midazolam and Ondansetron  Airway Management Planned: LMA  Additional Equipment: None  Intra-op Plan:   Post-operative Plan:   Informed Consent: I have reviewed the patients History and Physical, chart, labs and discussed the procedure including the risks, benefits and alternatives for the proposed anesthesia with the patient or authorized representative who has indicated his/her understanding and acceptance.     Dental advisory given  Plan Discussed with: CRNA and Anesthesiologist  Anesthesia Plan Comments:        Anesthesia Quick Evaluation

## 2021-05-02 ENCOUNTER — Ambulatory Visit (HOSPITAL_BASED_OUTPATIENT_CLINIC_OR_DEPARTMENT_OTHER)
Admission: RE | Admit: 2021-05-02 | Discharge: 2021-05-02 | Disposition: A | Discharge status: Home / Self Care | Payer: Medicare Other | Attending: Urology | Admitting: Urology

## 2021-05-02 ENCOUNTER — Ambulatory Visit (HOSPITAL_BASED_OUTPATIENT_CLINIC_OR_DEPARTMENT_OTHER): Payer: Medicare Other | Admitting: Anesthesiology

## 2021-05-02 ENCOUNTER — Encounter (HOSPITAL_BASED_OUTPATIENT_CLINIC_OR_DEPARTMENT_OTHER): Payer: Self-pay | Admitting: Urology

## 2021-05-02 ENCOUNTER — Encounter (HOSPITAL_BASED_OUTPATIENT_CLINIC_OR_DEPARTMENT_OTHER)
Admission: RE | Disposition: A | Discharge status: Home / Self Care | Payer: Self-pay | Source: Home / Self Care | Attending: Urology

## 2021-05-02 DIAGNOSIS — Z87891 Personal history of nicotine dependence: Secondary | ICD-10-CM | POA: Insufficient documentation

## 2021-05-02 DIAGNOSIS — I130 Hypertensive heart and chronic kidney disease with heart failure and stage 1 through stage 4 chronic kidney disease, or unspecified chronic kidney disease: Secondary | ICD-10-CM | POA: Insufficient documentation

## 2021-05-02 DIAGNOSIS — C662 Malignant neoplasm of left ureter: Secondary | ICD-10-CM | POA: Diagnosis not present

## 2021-05-02 DIAGNOSIS — Z881 Allergy status to other antibiotic agents status: Secondary | ICD-10-CM | POA: Insufficient documentation

## 2021-05-02 DIAGNOSIS — Z8249 Family history of ischemic heart disease and other diseases of the circulatory system: Secondary | ICD-10-CM | POA: Insufficient documentation

## 2021-05-02 DIAGNOSIS — I5043 Acute on chronic combined systolic (congestive) and diastolic (congestive) heart failure: Secondary | ICD-10-CM | POA: Diagnosis not present

## 2021-05-02 DIAGNOSIS — Z8551 Personal history of malignant neoplasm of bladder: Secondary | ICD-10-CM | POA: Insufficient documentation

## 2021-05-02 DIAGNOSIS — N281 Cyst of kidney, acquired: Secondary | ICD-10-CM | POA: Diagnosis not present

## 2021-05-02 DIAGNOSIS — N183 Chronic kidney disease, stage 3 unspecified: Secondary | ICD-10-CM | POA: Diagnosis not present

## 2021-05-02 DIAGNOSIS — Z8554 Personal history of malignant neoplasm of ureter: Secondary | ICD-10-CM | POA: Insufficient documentation

## 2021-05-02 DIAGNOSIS — Z8616 Personal history of COVID-19: Secondary | ICD-10-CM | POA: Insufficient documentation

## 2021-05-02 DIAGNOSIS — Z833 Family history of diabetes mellitus: Secondary | ICD-10-CM | POA: Insufficient documentation

## 2021-05-02 DIAGNOSIS — R896 Abnormal cytological findings in specimens from other organs, systems and tissues: Secondary | ICD-10-CM | POA: Diagnosis not present

## 2021-05-02 DIAGNOSIS — Z9079 Acquired absence of other genital organ(s): Secondary | ICD-10-CM | POA: Insufficient documentation

## 2021-05-02 DIAGNOSIS — I509 Heart failure, unspecified: Secondary | ICD-10-CM | POA: Diagnosis not present

## 2021-05-02 DIAGNOSIS — Z888 Allergy status to other drugs, medicaments and biological substances status: Secondary | ICD-10-CM | POA: Insufficient documentation

## 2021-05-02 HISTORY — PX: CYSTOSCOPY W/ URETERAL STENT REMOVAL: SHX1430

## 2021-05-02 HISTORY — PX: CYSTOSCOPY/URETEROSCOPY/HOLMIUM LASER/STENT PLACEMENT: SHX6546

## 2021-05-02 LAB — POCT I-STAT, CHEM 8
BUN: 15 mg/dL (ref 8–23)
Calcium, Ion: 1.18 mmol/L (ref 1.15–1.40)
Chloride: 99 mmol/L (ref 98–111)
Creatinine, Ser: 1.3 mg/dL — ABNORMAL HIGH (ref 0.44–1.00)
Glucose, Bld: 103 mg/dL — ABNORMAL HIGH (ref 70–99)
HCT: 43 % (ref 36.0–46.0)
Hemoglobin: 14.6 g/dL (ref 12.0–15.0)
Potassium: 3.6 mmol/L (ref 3.5–5.1)
Sodium: 142 mmol/L (ref 135–145)
TCO2: 28 mmol/L (ref 22–32)

## 2021-05-02 SURGERY — CYSTOSCOPY/URETEROSCOPY/HOLMIUM LASER/STENT PLACEMENT
Anesthesia: General | Site: Ureter | Laterality: Left

## 2021-05-02 MED ORDER — AMISULPRIDE (ANTIEMETIC) 5 MG/2ML IV SOLN: 10.0000 mg | Freq: Once | INTRAVENOUS | Status: DC | PRN

## 2021-05-02 MED ORDER — ONDANSETRON HCL 4 MG/2ML IJ SOLN
INTRAMUSCULAR | Status: DC | PRN
  Administered 2021-05-02: 4 mg via INTRAVENOUS

## 2021-05-02 MED ORDER — PROPOFOL 10 MG/ML IV BOLUS
INTRAVENOUS | Status: DC | PRN
  Administered 2021-05-02: 120 mg via INTRAVENOUS

## 2021-05-02 MED ORDER — HYDROMORPHONE HCL 1 MG/ML IJ SOLN
INTRAMUSCULAR | Status: AC
  Filled 2021-05-02: qty 1

## 2021-05-02 MED ORDER — FENTANYL CITRATE (PF) 100 MCG/2ML IJ SOLN
INTRAMUSCULAR | Status: DC | PRN
  Administered 2021-05-02: 50 ug via INTRAVENOUS

## 2021-05-02 MED ORDER — ONDANSETRON HCL 4 MG/2ML IJ SOLN
4.0000 mg | Freq: Once | INTRAMUSCULAR | Status: DC | PRN
Start: 2021-05-02 — End: 2021-05-02

## 2021-05-02 MED ORDER — DEXAMETHASONE SODIUM PHOSPHATE 4 MG/ML IJ SOLN
INTRAMUSCULAR | Status: DC | PRN
  Administered 2021-05-02: 4 mg via INTRAVENOUS

## 2021-05-02 MED ORDER — SODIUM CHLORIDE 0.9 % IV SOLN: INTRAVENOUS | Status: DC

## 2021-05-02 MED ORDER — IOHEXOL 300 MG/ML  SOLN
INTRAMUSCULAR | Status: DC | PRN
  Administered 2021-05-02: 10 mL via URETHRAL

## 2021-05-02 MED ORDER — DEXAMETHASONE SODIUM PHOSPHATE 10 MG/ML IJ SOLN
INTRAMUSCULAR | Status: AC
  Filled 2021-05-02: qty 1

## 2021-05-02 MED ORDER — PHENYLEPHRINE HCL (PRESSORS) 10 MG/ML IV SOLN
INTRAVENOUS | Status: DC | PRN
  Administered 2021-05-02 (×2): 80 ug via INTRAVENOUS

## 2021-05-02 MED ORDER — OXYCODONE HCL 5 MG PO TABS: 5.0000 mg | ORAL_TABLET | Freq: Once | ORAL | Status: DC | PRN

## 2021-05-02 MED ORDER — OXYCODONE HCL 5 MG/5ML PO SOLN
5.0000 mg | Freq: Once | ORAL | Status: DC | PRN
Start: 2021-05-02 — End: 2021-05-02

## 2021-05-02 MED ORDER — SODIUM CHLORIDE 0.9 % IV SOLN
2.0000 g | INTRAVENOUS | Status: AC
  Administered 2021-05-02: 2 g via INTRAVENOUS

## 2021-05-02 MED ORDER — SODIUM CHLORIDE 0.9 % IV SOLN
INTRAVENOUS | Status: AC
  Filled 2021-05-02: qty 2

## 2021-05-02 MED ORDER — FENTANYL CITRATE (PF) 100 MCG/2ML IJ SOLN
INTRAMUSCULAR | Status: AC
  Filled 2021-05-02: qty 2

## 2021-05-02 MED ORDER — HYDROMORPHONE HCL 1 MG/ML IJ SOLN
0.2500 mg | INTRAMUSCULAR | Status: DC | PRN
  Administered 2021-05-02 (×2): 0.25 mg via INTRAVENOUS

## 2021-05-02 MED ORDER — SODIUM CHLORIDE 0.9 % IR SOLN
Status: DC | PRN
  Administered 2021-05-02: 3000 mL

## 2021-05-02 MED ORDER — PROPOFOL 10 MG/ML IV BOLUS
INTRAVENOUS | Status: AC
  Filled 2021-05-02: qty 20

## 2021-05-02 MED ORDER — ACETAMINOPHEN 10 MG/ML IV SOLN: 1000.0000 mg | Freq: Once | INTRAVENOUS | Status: DC | PRN

## 2021-05-02 MED ORDER — LIDOCAINE HCL (PF) 2 % IJ SOLN
INTRAMUSCULAR | Status: AC
  Filled 2021-05-02: qty 5

## 2021-05-02 MED ORDER — EPHEDRINE SULFATE 50 MG/ML IJ SOLN
INTRAMUSCULAR | Status: DC | PRN
  Administered 2021-05-02 (×2): 10 mg via INTRAVENOUS

## 2021-05-02 MED ORDER — LIDOCAINE HCL (CARDIAC) PF 100 MG/5ML IV SOSY
PREFILLED_SYRINGE | INTRAVENOUS | Status: DC | PRN
  Administered 2021-05-02: 80 mg via INTRAVENOUS

## 2021-05-02 MED ORDER — ONDANSETRON HCL 4 MG/2ML IJ SOLN
INTRAMUSCULAR | Status: AC
  Filled 2021-05-02: qty 2

## 2021-05-02 SURGICAL SUPPLY — 22 items
BAG DRAIN URO-CYSTO SKYTR STRL (DRAIN) ×2 IMPLANT
BAG DRN UROCATH (DRAIN) ×1
BRUSH URET BIOPSY 3F (UROLOGICAL SUPPLIES) ×2 IMPLANT
CATH INTERMIT  6FR 70CM (CATHETERS) ×2 IMPLANT
CLOTH BEACON ORANGE TIMEOUT ST (SAFETY) ×2 IMPLANT
COVER DOME SNAP 22 D (MISCELLANEOUS) ×4 IMPLANT
GLOVE SURG ENC MOIS LTX SZ8 (GLOVE) ×2 IMPLANT
GLOVE SURG UNDER POLY LF SZ6.5 (GLOVE) ×2 IMPLANT
GOWN STRL REUS W/ TWL LRG LVL3 (GOWN DISPOSABLE) ×1 IMPLANT
GOWN STRL REUS W/TWL LRG LVL3 (GOWN DISPOSABLE) ×4 IMPLANT
GUIDEWIRE STR DUAL SENSOR (WIRE) ×2 IMPLANT
IV NS IRRIG 3000ML ARTHROMATIC (IV SOLUTION) ×2 IMPLANT
KIT TURNOVER CYSTO (KITS) ×2 IMPLANT
MANIFOLD NEPTUNE II (INSTRUMENTS) ×2 IMPLANT
NS IRRIG 500ML POUR BTL (IV SOLUTION) ×2 IMPLANT
PACK CYSTO (CUSTOM PROCEDURE TRAY) ×2 IMPLANT
SHEATH URETERAL 12FRX35CM (MISCELLANEOUS) ×2 IMPLANT
STENT URET 6FRX24 CONTOUR (STENTS) ×2 IMPLANT
TRACTIP FLEXIVA PULS ID 200XHI (Laser) ×1 IMPLANT
TRACTIP FLEXIVA PULSE ID 200 (Laser) ×2
TUBE CONNECTING 12X1/4 (SUCTIONS) ×2 IMPLANT
TUBING UROLOGY SET (TUBING) ×2 IMPLANT

## 2021-05-02 NOTE — Discharge Instructions (Addendum)
You may see some blood in the urine and may have some burning with urination for 48-72 hours. You also may notice that you have to urinate more frequently or urgently after your procedure which is normal.  You should call should you develop an inability urinate, fever > 101, persistent nausea and vomiting that prevents you from eating or drinking to stay hydrated.  If you have a stent, you will likely urinate more frequently and urgently until the stent is removed and you may experience some discomfort/pain in the lower abdomen and flank especially when urinating. You may take pain medication prescribed to you if needed for pain. You may also intermittently have blood in the urine until the stent is removed. If you have a catheter, you will be taught how to take care of the catheter by the nursing staff prior to discharge from the hospital.  You may periodically feel a strong urge to void with the catheter in place.  This is a bladder spasm and most often can occur when having a bowel movement or moving around. It is typically self-limited and usually will stop after a few minutes.  You may use some Vaseline or Neosporin around the tip of the catheter to reduce friction at the tip of the penis. You may also see some blood in the urine.  A very small amount of blood can make the urine look quite red.  As long as the catheter is draining well, there usually is not a problem.  However, if the catheter is not draining well and is bloody, you should call the office [phone]) to notify us.     CYSTOSCOPY HOME CARE INSTRUCTIONS  Activity: Rest for the remainder of the day.  Do not drive or operate equipment today.  You may resume normal activities in one to two days as instructed by your physician.   Meals: Drink plenty of liquids and eat light foods such as gelatin or soup this evening.  You may return to a normal meal plan tomorrow.  Return to Work: You may return to work in one to two days or as  instructed by your physician.  Special Instructions / Symptoms: Call your physician if any of these symptoms occur:   -persistent or heavy bleeding  -bleeding which continues after first few urination  -large blood clots that are difficult to pass  -urine stream diminishes or stops completely  -fever equal to or higher than 101 degrees Farenheit.  -cloudy urine with a strong, foul odor  -severe pain  Females should always wipe from front to back after elimination.  You may feel some burning pain when you urinate.  This should disappear with time.  Applying moist heat to the lower abdomen or a hot tub bath may help relieve the pain. \  Follow-Up / Date of Return Visit to Your Physician: Call for an appointment to arrange follow-up.  Patient Signature:  ________________________________________________________  Nurse's Signature:  ________________________________________________________     Post Anesthesia Home Care Instructions  Activity: Get plenty of rest for the remainder of the day. A responsible individual must stay with you for 24 hours following the procedure.  For the next 24 hours, DO NOT: -Drive a car -Paediatric nurse -Drink alcoholic beverages -Take any medication unless instructed by your physician -Make any legal decisions or sign important papers.  Meals: Start with liquid foods such as gelatin or soup. Progress to regular foods as tolerated. Avoid greasy, spicy, heavy foods. If nausea and/or vomiting occur, drink  only clear liquids until the nausea and/or vomiting subsides. Call your physician if vomiting continues.  Special Instructions/Symptoms: Your throat may feel dry or sore from the anesthesia or the breathing tube placed in your throat during surgery. If this causes discomfort, gargle with warm salt water. The discomfort should disappear within 24 hours.  If you had a scopolamine patch placed behind your ear for the management of post- operative nausea  and/or vomiting:  1. The medication in the patch is effective for 72 hours, after which it should be removed.  Wrap patch in a tissue and discard in the trash. Wash hands thoroughly with soap and water. 2. You may remove the patch earlier than 72 hours if you experience unpleasant side effects which may include dry mouth, dizziness or visual disturbances. 3. Avoid touching the patch. Wash your hands with soap and water after contact with the patch.

## 2021-05-02 NOTE — Transfer of Care (Signed)
Immediate Anesthesia Transfer of Care Note  Patient: [name]  Procedure(s) Performed: CYSTOSCOPY/URETEROSCOPY WITH BRUSH BIOPSY/ HOLMIUM LASER/STENT REPLACEMENT (Left: Ureter) CYSTOSCOPY WITH STENT REMOVAL (Left: Ureter)  Patient Location: PACU  Anesthesia Type:General  Level of Consciousness: awake, alert  and oriented  Airway & Oxygen Therapy: Patient Spontanous Breathing and Patient connected to face mask oxygen  Post-op Assessment: Report given to RN and Post -op Vital signs reviewed and stable  Post vital signs: Reviewed and stable  Last Vitals:  Vitals Value Taken Time  BP 147/67 05/02/21 0853  Temp    Pulse 72 05/02/21 0854  Resp 14 05/02/21 0854  SpO2 100 % 05/02/21 0854  Vitals shown include unvalidated device data.  Last Pain:  Vitals:   05/02/21 [id number]  TempSrc: Oral  PainSc: 0-No pain      Patients Stated Pain Goal: 7 ([id number])  Complications: No notable events documented.

## 2021-05-02 NOTE — Anesthesia Postprocedure Evaluation (Signed)
Anesthesia Post Note  Patient: [name]  Procedure(s) Performed: CYSTOSCOPY/URETEROSCOPY WITH BRUSH BIOPSY/ RETROGRADE PYELOGRAM/ HOLMIUM LASER/STENT REPLACEMENT (Left: Ureter) CYSTOSCOPY WITH STENT REMOVAL (Left: Ureter)     Patient location during evaluation: PACU Anesthesia Type: General Level of consciousness: awake and alert Pain management: pain level controlled Vital Signs Assessment: post-procedure vital signs reviewed and stable Respiratory status: spontaneous breathing, nonlabored ventilation, respiratory function stable and patient connected to nasal cannula oxygen Cardiovascular status: blood pressure returned to baseline and stable Postop Assessment: no apparent nausea or vomiting Anesthetic complications: no   No notable events documented.  Last Vitals:  Vitals:   05/02/21 1000 05/02/21 1036  BP: (!) 114/54 (!) 111/59  Pulse: 64 61  Resp: 20 14  Temp: (!) 36.3 C (!) 36.3 C  SpO2: 96% 96%    Last Pain:  Vitals:   05/02/21 1036  TempSrc:   PainSc: 0-No pain                 Barnet Glasgow

## 2021-05-02 NOTE — OR Nursing (Signed)
Left ureteral stent was removed by Dr. Dahlstedt 

## 2021-05-02 NOTE — Op Note (Signed)
Preoperative diagnosis: History of urothelial carcinoma of the left ureter, solitary left kidney  Postoperative diagnosis: Same  Principal procedure: Cystoscopy, left double-J stent extraction, left retrograde ureteropyelogram, fluoroscopic interpretation, left ureteroscopy, brush biopsy of left ureteral lesion, holmium laser ablation of left ureteral lesion, left renal washings, holmium laser ablation of small left upper calyceal lesion, placement of 6 French by 24 cm contour double-J stent without tether  Surgeon: Rainna Nearhood  Anesthesia: General with LMA  Complications: None  Estimated blood loss: Less than 5 mL  Specimen: 1.  Left ureteral brushings 2.  Left renal washings  Drains: None  Indications: 75 year old white female with history of prior high-grade urothelial carcinoma of right distal ureter as well as bladder.  She underwent TURBT and right nephro ureterectomy.  There has been no evidence of recurrence in her bladder since that time, but in 2020 she was found to have hydronephrosis on the left, renal insufficiency.  She was then found to have a urothelial carcinoma in the left distal ureter.  This was low-grade in nature.  Since then, it has been managed with laser ablation, stenting, and repeat ureteroscopy's.  She presents at this time for cystoscopy, double-J stent extraction, ureteroscopic management of any urothelial lesion in the ureter, renoscopy.  The patient is aware of the procedure, risks, complications and expected outcomes.  She desires to proceed.  Findings: Bladder appeared normal.  Left ureteral orifice was normal.  There were no urothelial lesions within the bladder.  Retrograde study of the left ureter and kidney with Omnipaque revealed no evidence of filling defects along the ureter.  There was minimal narrowing at the site of prior treatments in the ureter.  Pyelocalyceal system was normal.  Description of procedure: The patient was properly marked in the holding  area, identified, and taken to the operating room where general anesthetic was administered with the LMA.  She was placed in the dorsolithotomy position.  Genitalia and perineum were prepped, draped, proper timeout performed.  21 French panendoscope advanced into the bladder.  After systematic inspection, the stent which was present at the left ureteral orifice was grasped and easily extracted.  Retrograde study of the left ureter and kidney was performed using Omnipaque and a 6 [place] open-ended catheter.  After the above-mentioned findings were noted, safety wire was placed.  I then passed a 4-1/2 [place] semirigid ureteroscope.  This was passed all the way to the renal pelvis without difficulty.  There was minimal narrowing of the left mid/distal ureter.  There was mild urothelial abnormality, more nodular than papillary in nature.  More than likely, this was from prior scar.  However, vigorous brush biopsy was performed.  Brush was then sent for cytology labeled "left ureteral brushings".  Following this, the 200 m fiber was utilized to ablate this lesion using holmium laser energy at a rate of 30 and 0.3 J.  Care was taken to avoid any deep injury.  Following this, the ureteroscope was removed.  Over the top of the guidewire I passed short 12/14 ureteral access catheter.  After initial dilatation with the obturator, the entire catheter was easily placed.  The obturator was removed, and I passed the 4-1/2 [place] single-lumen digital ureteroscope.  This easily passed into the pyelocalyceal system.  There was 1 small urothelial abnormality in the superior lateral calyx, approximately 2 to 3 mm in size.  After no further lesions were seen, washings were taken of the left renal pelvis, sent for cytology labeled "left renal washings".  At  this point, the laser fiber was passed and the small lesion was ablated.  There was no bleeding.  After this, the ureteroscope was removed.  The access catheter was removed,  guidewire backloaded through the cystoscope and a 6 [place] by 24 cm contour double-J stent was then passed using fluoroscopic and cystoscopic guidance.  After proper position was verified, the guidewire was removed and the stent deployed with excellent proximal distal curl seen.  Bladder was drained, procedure terminated after the scope was removed.  The patient was awakened, taken to the PACU in stable condition having tolerated the procedure well.

## 2021-05-02 NOTE — Anesthesia Procedure Notes (Signed)
Procedure Name: LMA Insertion Date/Time: 05/02/2021 8:03 AM Performed by: Maryella Shivers, CRNA Pre-anesthesia Checklist: Patient identified, Emergency Drugs available, Suction available and Patient being monitored Patient Re-evaluated:Patient Re-evaluated prior to induction Oxygen Delivery Method: Circle system utilized Preoxygenation: Pre-oxygenation with 100% oxygen Induction Type: IV induction Ventilation: Mask ventilation without difficulty LMA: LMA inserted LMA Size: 4.0 Number of attempts: 1 Airway Equipment and Method: Bite block Placement Confirmation: positive ETCO2 Tube secured with: Tape Dental Injury: Teeth and Oropharynx as per pre-operative assessment

## 2021-05-02 NOTE — H&P (Signed)
Urology Admission H&P  Chief Complaint: Urothelial carcinoma of left ureter  History of Present Illness: 75 year old female presents at this time for cystoscopy, stent extraction on the left, ureteroscopy, possible laser of any persistent/recurrent urothelial carcinoma of her distal ureter, stent replacement.  She has a long history of urothelial carcinoma, first of her right distal ureter and bladder approximately 10 years ago.  Her recurrent urothelial carcinoma in her solitary left ureter has been managed with the above strategy and at last ureteroscopy there was no evidence of persistent disease.  Past Medical History:  Diagnosis Date   Anxiety    CAD in native artery    a. cath 10/2019- mild to moderate dx >> medical therapy    Cancer ([place])    left ureteral cancer dx sept 2020   CHF (congestive heart failure) ([place])    Chronic kidney disease 06/2019   acute renal insufficiency sees dr Dorina Hoyer, ckd stage 3 per pt   COVID 09/2020   tired sore throat nausea hair fell out loss of taste and smell x 10 days, all symptoms resolved except still has loss of taste and smell   Depression    GERD (gastroesophageal reflux disease)    Hyperlipidemia    Hypertension    Past Surgical History:  Procedure Laterality Date   ABDOMINAL HYSTERECTOMY     partial   bladder-partial removal     CYSTOSCOPY W/ URETERAL STENT PLACEMENT Left 11/17/2019   Procedure: CYSTOSCOPY WITH STENT REPLACEMENT;  Surgeon: Franchot Gallo, MD;  Location: [facility];  Service: Urology;  Laterality: Left;   CYSTOSCOPY W/ URETERAL STENT REMOVAL Left 05/10/2020   Procedure: CYSTOSCOPY WITH STENT REMOVAL;  Surgeon: Franchot Gallo, MD;  Location: [facility];  Service: Urology;  Laterality: Left;   CYSTOSCOPY WITH RETROGRADE PYELOGRAM, URETEROSCOPY AND STENT PLACEMENT Left 06/27/2019   Procedure: CYSTOSCOPY WITH LEFT RETROGRADE PYELOGRAM, URETEROSCOPY, BIOPSY AND LEFT STENT PLACEMENT;  Surgeon:  Irine Seal, MD;  Location: [facility];  Service: Urology;  Laterality: Left;   CYSTOSCOPY WITH RETROGRADE PYELOGRAM, URETEROSCOPY AND STENT PLACEMENT Left 08/18/2019   Procedure: CYSTOSCOPY, URETEROSCOPY AND STENT EXCHANGE;  Surgeon: Franchot Gallo, MD;  Location: [facility];  Service: Urology;  Laterality: Left;  58 MINS   CYSTOSCOPY WITH RETROGRADE PYELOGRAM, URETEROSCOPY AND STENT PLACEMENT Left 11/17/2019   Procedure: CYSTOSCOPY WITH RETROGRADE PYELOGRAM, URETEROSCOPY AND STENT PLACEMENT;  Surgeon: Franchot Gallo, MD;  Location: [facility];  Service: Urology;  Laterality: Left;  19 MINS   CYSTOSCOPY WITH RETROGRADE PYELOGRAM, URETEROSCOPY AND STENT PLACEMENT Left 05/10/2020   Procedure: CYSTOSCOPY WITH RETROGRADE PYELOGRAM, URETEROSCOPY AND STENT PLACEMENT WITH URETHRAL DIALATION AND BRUSH BIOPSY;  Surgeon: Franchot Gallo, MD;  Location: [facility];  Service: Urology;  Laterality: Left;  1 HR   CYSTOSCOPY WITH RETROGRADE PYELOGRAM, URETEROSCOPY AND STENT PLACEMENT Left 09/16/2020   Procedure: CYSTOSCOPY WITH RETROGRADE PYELOGRAM, URETEROSCOPY ,  LITHOPAXY, LEFT URETERAL BRUSHING, AND STENT REPLACEMENT;  Surgeon: Franchot Gallo, MD;  Location: [facility];  Service: Urology;  Laterality: Left;   HOLMIUM LASER APPLICATION Left [id number]   Procedure: HOLMIUM LASER APPLICATION;  Surgeon: Franchot Gallo, MD;  Location: [facility];  Service: Urology;  Laterality: Left;   kidney removed  2006   right    RIGHT/LEFT HEART CATH AND CORONARY ANGIOGRAPHY N/A 11/06/2019   Procedure: RIGHT/LEFT HEART CATH AND CORONARY ANGIOGRAPHY;  Surgeon: Belva Crome, MD;  Location: [facility];  Service: Cardiovascular;  Laterality: N/A;  THULIUM LASER TURP (TRANSURETHRAL RESECTION OF PROSTATE) Left 08/18/2019   Procedure: THULIUM LASER ABLATION OF URETERAL TUMOR;  Surgeon: Franchot Gallo, MD;  Location: [facility];  Service: Urology;   Laterality: Left;   THULIUM LASER TURP (TRANSURETHRAL RESECTION OF PROSTATE) Left 11/17/2019   Procedure: THULIUM LASER of URETERAL CANCER;  Surgeon: Franchot Gallo, MD;  Location: [facility];  Service: Urology;  Laterality: Left;    Home Medications:  Current Facility-Administered Medications  Medication Dose Route Frequency Provider Last Rate Last Admin   0.9 %  sodium chloride infusion   Intravenous Continuous Audry Pili, MD 50 mL/hr at 05/02/21 0638 New Bag at 05/02/21 [id number]   ceFAZolin (ANCEF) 2 g in sodium chloride 0.9 % 100 mL IVPB  2 g Intravenous 30 min Pre-Op Franchot Gallo, MD       Allergies:  Allergies  Allergen Reactions   Erythromycin Diarrhea and Nausea And Vomiting   Lotensin [Benazepril Hcl] Cough    Family History  Problem Relation Age of Onset   Hypertension Mother    Diabetes Mellitus II Sister    Hypertension Sister    Hypertension Brother    Social History:  reports that she quit smoking about 17 years ago. Her smoking use included cigarettes. She has a 19.00 pack-year smoking history. She has never used smokeless tobacco. She reports that she does not drink alcohol and does not use drugs.  Review of Systems  Physical Exam:  Vital signs in last 24 hours: Temp:  [97.8 F (36.6 C)] 97.8 F (36.6 C) (07/25 [id number]) Pulse Rate:  [68] 68 (07/25 0632) Resp:  [17] 17 (07/25 [id number]) BP: (115)/(56) 115/56 (07/25 [id number]) SpO2:  [97 %] 97 % (07/25 [id number]) Weight:  [78.3 kg] 78.3 kg (07/25 [id number]) Physical Exam  Laboratory Data:  Results for orders placed or performed during the hospital encounter of 05/02/21 (from the past 24 hour(s))  I-STAT, chem 8     Status: Abnormal   Collection Time: 05/02/21  6:33 AM  Result Value Ref Range   Sodium 142 135 - 145 mmol/L   Potassium 3.6 3.5 - 5.1 mmol/L   Chloride 99 98 - 111 mmol/L   BUN 15 8 - 23 mg/dL   Creatinine, Ser 1.30 (H) 0.44 - 1.00 mg/dL   Glucose, Bld 103 (H) 70 - 99 mg/dL   Calcium, Ion  1.18 1.15 - 1.40 mmol/L   TCO2 28 22 - 32 mmol/L   Hemoglobin 14.6 12.0 - 15.0 g/dL   HCT 43.0 36.0 - 46.0 %   No results found for this or any previous visit (from the past 240 hour(s)). Creatinine: Recent Labs    05/02/21 [id number]  CREATININE 1.30*    Impression/Assessment:  Urothelial carcinoma and solitary left kidney  Plan:  Cystoscopy, left stent extraction, ureteroscopy, possible biopsy/laser ablation of any recurrent tumor, stent replacement  Lillette Boxer Lyndsay Talamante 05/02/2021, 7:44 AM

## 2021-05-03 ENCOUNTER — Encounter (HOSPITAL_BASED_OUTPATIENT_CLINIC_OR_DEPARTMENT_OTHER): Payer: Self-pay | Admitting: Urology

## 2021-05-03 LAB — CYTOLOGY - NON PAP

## 2021-05-05 ENCOUNTER — Other Ambulatory Visit: Payer: Self-pay

## 2021-05-05 ENCOUNTER — Ambulatory Visit
Admission: RE | Admit: 2021-05-05 | Discharge: 2021-05-05 | Disposition: A | Discharge status: Home / Self Care | Payer: Medicare Other | Source: Ambulatory Visit | Attending: Family Medicine | Admitting: Family Medicine

## 2021-05-05 DIAGNOSIS — Z1231 Encounter for screening mammogram for malignant neoplasm of breast: Secondary | ICD-10-CM | POA: Diagnosis not present

## 2021-05-25 DIAGNOSIS — C652 Malignant neoplasm of left renal pelvis: Secondary | ICD-10-CM | POA: Diagnosis not present

## 2021-05-25 DIAGNOSIS — C678 Malignant neoplasm of overlapping sites of bladder: Secondary | ICD-10-CM | POA: Diagnosis not present

## 2021-05-25 DIAGNOSIS — R8279 Other abnormal findings on microbiological examination of urine: Secondary | ICD-10-CM | POA: Diagnosis not present

## 2021-05-25 DIAGNOSIS — C662 Malignant neoplasm of left ureter: Secondary | ICD-10-CM | POA: Diagnosis not present

## 2021-06-06 DIAGNOSIS — I10 Essential (primary) hypertension: Secondary | ICD-10-CM | POA: Diagnosis not present

## 2021-06-06 DIAGNOSIS — R7309 Other abnormal glucose: Secondary | ICD-10-CM | POA: Diagnosis not present

## 2021-06-07 DIAGNOSIS — C662 Malignant neoplasm of left ureter: Secondary | ICD-10-CM | POA: Diagnosis not present

## 2021-06-07 DIAGNOSIS — C679 Malignant neoplasm of bladder, unspecified: Secondary | ICD-10-CM | POA: Diagnosis not present

## 2021-06-07 DIAGNOSIS — C678 Malignant neoplasm of overlapping sites of bladder: Secondary | ICD-10-CM | POA: Diagnosis not present

## 2021-06-07 DIAGNOSIS — Z8554 Personal history of malignant neoplasm of ureter: Secondary | ICD-10-CM | POA: Diagnosis not present

## 2021-06-07 DIAGNOSIS — K449 Diaphragmatic hernia without obstruction or gangrene: Secondary | ICD-10-CM | POA: Diagnosis not present

## 2021-06-07 DIAGNOSIS — N1330 Unspecified hydronephrosis: Secondary | ICD-10-CM | POA: Diagnosis not present

## 2021-06-08 DIAGNOSIS — I5022 Chronic systolic (congestive) heart failure: Secondary | ICD-10-CM | POA: Diagnosis not present

## 2021-06-08 DIAGNOSIS — M545 Low back pain, unspecified: Secondary | ICD-10-CM | POA: Diagnosis not present

## 2021-06-08 DIAGNOSIS — R7309 Other abnormal glucose: Secondary | ICD-10-CM | POA: Diagnosis not present

## 2021-06-08 DIAGNOSIS — E7800 Pure hypercholesterolemia, unspecified: Secondary | ICD-10-CM | POA: Diagnosis not present

## 2021-06-08 DIAGNOSIS — R58 Hemorrhage, not elsewhere classified: Secondary | ICD-10-CM | POA: Diagnosis not present

## 2021-06-08 DIAGNOSIS — G4700 Insomnia, unspecified: Secondary | ICD-10-CM | POA: Diagnosis not present

## 2021-06-08 DIAGNOSIS — Z0000 Encounter for general adult medical examination without abnormal findings: Secondary | ICD-10-CM | POA: Diagnosis not present

## 2021-06-08 DIAGNOSIS — I10 Essential (primary) hypertension: Secondary | ICD-10-CM | POA: Diagnosis not present

## 2021-06-24 DIAGNOSIS — C652 Malignant neoplasm of left renal pelvis: Secondary | ICD-10-CM | POA: Diagnosis not present

## 2021-06-24 DIAGNOSIS — C662 Malignant neoplasm of left ureter: Secondary | ICD-10-CM | POA: Diagnosis not present

## 2021-07-04 ENCOUNTER — Other Ambulatory Visit (HOSPITAL_COMMUNITY): Payer: Self-pay | Admitting: Urology

## 2021-07-04 DIAGNOSIS — C652 Malignant neoplasm of left renal pelvis: Secondary | ICD-10-CM

## 2021-07-05 ENCOUNTER — Ambulatory Visit (HOSPITAL_COMMUNITY): Admission: RE | Admit: 2021-07-05 | Payer: Medicare Other | Source: Ambulatory Visit

## 2021-07-05 ENCOUNTER — Other Ambulatory Visit: Payer: Self-pay | Admitting: Radiology

## 2021-07-06 ENCOUNTER — Other Ambulatory Visit: Payer: Self-pay

## 2021-07-06 ENCOUNTER — Ambulatory Visit (HOSPITAL_COMMUNITY)
Admission: RE | Admit: 2021-07-06 | Discharge: 2021-07-06 | Disposition: A | Discharge status: Home / Self Care | Payer: Medicare Other | Source: Ambulatory Visit | Attending: Urology | Admitting: Urology

## 2021-07-06 ENCOUNTER — Encounter (HOSPITAL_COMMUNITY): Payer: Self-pay

## 2021-07-06 DIAGNOSIS — Z436 Encounter for attention to other artificial openings of urinary tract: Secondary | ICD-10-CM | POA: Diagnosis not present

## 2021-07-06 DIAGNOSIS — Z79899 Other long term (current) drug therapy: Secondary | ICD-10-CM | POA: Insufficient documentation

## 2021-07-06 DIAGNOSIS — Z8616 Personal history of COVID-19: Secondary | ICD-10-CM | POA: Insufficient documentation

## 2021-07-06 DIAGNOSIS — Z8249 Family history of ischemic heart disease and other diseases of the circulatory system: Secondary | ICD-10-CM | POA: Diagnosis not present

## 2021-07-06 DIAGNOSIS — I2510 Atherosclerotic heart disease of native coronary artery without angina pectoris: Secondary | ICD-10-CM | POA: Insufficient documentation

## 2021-07-06 DIAGNOSIS — E785 Hyperlipidemia, unspecified: Secondary | ICD-10-CM | POA: Insufficient documentation

## 2021-07-06 DIAGNOSIS — Z90711 Acquired absence of uterus with remaining cervical stump: Secondary | ICD-10-CM | POA: Diagnosis not present

## 2021-07-06 DIAGNOSIS — Z888 Allergy status to other drugs, medicaments and biological substances status: Secondary | ICD-10-CM | POA: Diagnosis not present

## 2021-07-06 DIAGNOSIS — N189 Chronic kidney disease, unspecified: Secondary | ICD-10-CM | POA: Diagnosis not present

## 2021-07-06 DIAGNOSIS — N1330 Unspecified hydronephrosis: Secondary | ICD-10-CM | POA: Diagnosis not present

## 2021-07-06 DIAGNOSIS — I509 Heart failure, unspecified: Secondary | ICD-10-CM | POA: Insufficient documentation

## 2021-07-06 DIAGNOSIS — I130 Hypertensive heart and chronic kidney disease with heart failure and stage 1 through stage 4 chronic kidney disease, or unspecified chronic kidney disease: Secondary | ICD-10-CM | POA: Diagnosis not present

## 2021-07-06 DIAGNOSIS — Z8554 Personal history of malignant neoplasm of ureter: Secondary | ICD-10-CM | POA: Diagnosis not present

## 2021-07-06 DIAGNOSIS — Z881 Allergy status to other antibiotic agents status: Secondary | ICD-10-CM | POA: Diagnosis not present

## 2021-07-06 DIAGNOSIS — C562 Malignant neoplasm of left ovary: Secondary | ICD-10-CM | POA: Diagnosis present

## 2021-07-06 DIAGNOSIS — Z87891 Personal history of nicotine dependence: Secondary | ICD-10-CM | POA: Insufficient documentation

## 2021-07-06 DIAGNOSIS — Z7982 Long term (current) use of aspirin: Secondary | ICD-10-CM | POA: Diagnosis not present

## 2021-07-06 DIAGNOSIS — N183 Chronic kidney disease, stage 3 unspecified: Secondary | ICD-10-CM | POA: Diagnosis not present

## 2021-07-06 DIAGNOSIS — C662 Malignant neoplasm of left ureter: Secondary | ICD-10-CM | POA: Diagnosis not present

## 2021-07-06 DIAGNOSIS — C652 Malignant neoplasm of left renal pelvis: Secondary | ICD-10-CM

## 2021-07-06 DIAGNOSIS — C688 Malignant neoplasm of overlapping sites of urinary organs: Secondary | ICD-10-CM | POA: Diagnosis not present

## 2021-07-06 HISTORY — PX: IR NEPHROSTOMY PLACEMENT LEFT: IMG6063

## 2021-07-06 LAB — CBC WITH DIFFERENTIAL/PLATELET
Abs Immature Granulocytes: 0.02 10*3/uL (ref 0.00–0.07)
Basophils Absolute: 0.1 10*3/uL (ref 0.0–0.1)
Basophils Relative: 1 %
Eosinophils Absolute: 0.3 10*3/uL (ref 0.0–0.5)
Eosinophils Relative: 4 %
HCT: 41.4 % (ref 36.0–46.0)
Hemoglobin: 13.3 g/dL (ref 12.0–15.0)
Immature Granulocytes: 0 %
Lymphocytes Relative: 34 %
Lymphs Abs: 2.3 10*3/uL (ref 0.7–4.0)
MCH: 30 pg (ref 26.0–34.0)
MCHC: 32.1 g/dL (ref 30.0–36.0)
MCV: 93.5 fL (ref 80.0–100.0)
Monocytes Absolute: 0.7 10*3/uL (ref 0.1–1.0)
Monocytes Relative: 10 %
Neutro Abs: 3.4 10*3/uL (ref 1.7–7.7)
Neutrophils Relative %: 51 %
Platelets: 214 10*3/uL (ref 150–400)
RBC: 4.43 MIL/uL (ref 3.87–5.11)
RDW: 13.9 % (ref 11.5–15.5)
WBC: 6.8 10*3/uL (ref 4.0–10.5)
nRBC: 0 % (ref 0.0–0.2)

## 2021-07-06 LAB — BASIC METABOLIC PANEL
Anion gap: 7 (ref 5–15)
BUN: 15 mg/dL (ref 8–23)
CO2: 30 mmol/L (ref 22–32)
Calcium: 9.1 mg/dL (ref 8.9–10.3)
Chloride: 96 mmol/L — ABNORMAL LOW (ref 98–111)
Creatinine, Ser: 1.35 mg/dL — ABNORMAL HIGH (ref 0.44–1.00)
GFR, Estimated: 41 mL/min — ABNORMAL LOW (ref >60)
Glucose, Bld: 94 mg/dL (ref 70–99)
Potassium: 4.4 mmol/L (ref 3.5–5.1)
Sodium: 133 mmol/L — ABNORMAL LOW (ref 135–145)

## 2021-07-06 LAB — PROTIME-INR
INR: 1 (ref 0.8–1.2)
Prothrombin Time: 13.1 seconds (ref 11.4–15.2)

## 2021-07-06 MED ORDER — SODIUM CHLORIDE 0.9 % IV SOLN
INTRAVENOUS | Status: AC
  Administered 2021-07-06: 2 g via INTRAVENOUS
  Filled 2021-07-06: qty 20

## 2021-07-06 MED ORDER — HYDROCODONE-ACETAMINOPHEN 5-325 MG PO TABS: 1.0000 | ORAL_TABLET | ORAL | Status: DC | PRN

## 2021-07-06 MED ORDER — SODIUM CHLORIDE 0.9 % IV SOLN: INTRAVENOUS | Status: DC

## 2021-07-06 MED ORDER — IOHEXOL 240 MG/ML SOLN
50.0000 mL | Freq: Once | INTRAMUSCULAR | Status: AC | PRN
  Administered 2021-07-06: 20 mL via INTRAVENOUS

## 2021-07-06 MED ORDER — SODIUM CHLORIDE 0.9 % IV SOLN: 2.0000 g | Freq: Once | INTRAVENOUS | Status: AC

## 2021-07-06 MED ORDER — FENTANYL CITRATE (PF) 100 MCG/2ML IJ SOLN
INTRAMUSCULAR | Status: AC
  Filled 2021-07-06: qty 2

## 2021-07-06 MED ORDER — LIDOCAINE HCL 1 % IJ SOLN
INTRAMUSCULAR | Status: AC
  Filled 2021-07-06: qty 20

## 2021-07-06 MED ORDER — FENTANYL CITRATE (PF) 100 MCG/2ML IJ SOLN
INTRAMUSCULAR | Status: DC | PRN
  Administered 2021-07-06 (×2): 25 ug via INTRAVENOUS

## 2021-07-06 MED ORDER — MIDAZOLAM HCL 2 MG/2ML IJ SOLN
INTRAMUSCULAR | Status: DC | PRN
  Administered 2021-07-06 (×2): 1 mg via INTRAVENOUS

## 2021-07-06 MED ORDER — MIDAZOLAM HCL 2 MG/2ML IJ SOLN
INTRAMUSCULAR | Status: AC
  Filled 2021-07-06: qty 2

## 2021-07-06 NOTE — Procedures (Signed)
Interventional Radiology Procedure:   Indications: Urothelial cancer and needs nephrostomy tube for treatment  Procedure: Left nephrostomy tube placement  Findings: Mild - moderate left hydronephrosis.  10 Fr drain placed through mid pole calyx.  Takes approximately 10 ml to fill calyces and renal pelvis with fluid.   Complications: No immediate complications noted.     EBL: Minimal  Plan: Bedrest 2 hours and then discharge to home.  Heather Assefa R. Anselm Pancoast, MD  Pager: [phone]

## 2021-07-06 NOTE — H&P (Signed)
Chief Complaint: Patient was seen in consultation today for urothelial cancer  Referring Physician(s): [place]  Supervising Physician: Markus Daft  Patient Status: [name]  History of Present Illness: [name] is a 75 y.o. female with past medical history of anxiety, CAD, CHF, CKD, GERD, HLD, HTN, left ureteral cancer followed by Dr. Diona Fanti who presents to [name] today for left PCN placement for ongoing treatment of her urothelial cancer.  She is s/p stent removal this AM by Dr. Diona Fanti.  She presents today in her usual state of health.  She was given local numbing for her procedure this AM, but is prepared for sedation for procedure with IR today. She is understanding of the care plan and is agreeable to proceed.   Past Medical History:  Diagnosis Date   Anxiety    CAD in native artery    a. cath 10/2019- mild to moderate dx >> medical therapy    Cancer ([place])    left ureteral cancer dx sept 2020   CHF (congestive heart failure) ([place])    Chronic kidney disease 06/2019   acute renal insufficiency sees dr Dorina Hoyer, ckd stage 3 per pt   COVID 09/2020   tired sore throat nausea hair fell out loss of taste and smell x 10 days, all symptoms resolved except still has loss of taste and smell   Depression    GERD (gastroesophageal reflux disease)    Hyperlipidemia    Hypertension     Past Surgical History:  Procedure Laterality Date   ABDOMINAL HYSTERECTOMY     partial   bladder-partial removal     CYSTOSCOPY W/ URETERAL STENT PLACEMENT Left 11/17/2019   Procedure: CYSTOSCOPY WITH STENT REPLACEMENT;  Surgeon: Franchot Gallo, MD;  Location: [name];  Service: Urology;  Laterality: Left;   CYSTOSCOPY W/ URETERAL STENT REMOVAL Left 05/10/2020   Procedure: CYSTOSCOPY WITH STENT REMOVAL;  Surgeon: Franchot Gallo, MD;  Location: [name];  Service: Urology;  Laterality: Left;   CYSTOSCOPY W/ URETERAL STENT  REMOVAL Left 05/02/2021   Procedure: CYSTOSCOPY WITH STENT REMOVAL;  Surgeon: Franchot Gallo, MD;  Location: [name];  Service: Urology;  Laterality: Left;   CYSTOSCOPY WITH RETROGRADE PYELOGRAM, URETEROSCOPY AND STENT PLACEMENT Left 06/27/2019   Procedure: CYSTOSCOPY WITH LEFT RETROGRADE PYELOGRAM, URETEROSCOPY, BIOPSY AND LEFT STENT PLACEMENT;  Surgeon: Irine Seal, MD;  Location: [name];  Service: Urology;  Laterality: Left;   CYSTOSCOPY WITH RETROGRADE PYELOGRAM, URETEROSCOPY AND STENT PLACEMENT Left 08/18/2019   Procedure: CYSTOSCOPY, URETEROSCOPY AND STENT EXCHANGE;  Surgeon: Franchot Gallo, MD;  Location: [name];  Service: Urology;  Laterality: Left;  53 MINS   CYSTOSCOPY WITH RETROGRADE PYELOGRAM, URETEROSCOPY AND STENT PLACEMENT Left 11/17/2019   Procedure: CYSTOSCOPY WITH RETROGRADE PYELOGRAM, URETEROSCOPY AND STENT PLACEMENT;  Surgeon: Franchot Gallo, MD;  Location: [name];  Service: Urology;  Laterality: Left;  92 MINS   CYSTOSCOPY WITH RETROGRADE PYELOGRAM, URETEROSCOPY AND STENT PLACEMENT Left 05/10/2020   Procedure: CYSTOSCOPY WITH RETROGRADE PYELOGRAM, URETEROSCOPY AND STENT PLACEMENT WITH URETHRAL DIALATION AND BRUSH BIOPSY;  Surgeon: Franchot Gallo, MD;  Location: [name];  Service: Urology;  Laterality: Left;  1 HR   CYSTOSCOPY WITH RETROGRADE PYELOGRAM, URETEROSCOPY AND STENT PLACEMENT Left 09/16/2020   Procedure: CYSTOSCOPY WITH RETROGRADE PYELOGRAM, URETEROSCOPY ,  LITHOPAXY, LEFT URETERAL BRUSHING, AND STENT REPLACEMENT;  Surgeon: Franchot Gallo, MD;  Location: [name];  Service: Urology;  Laterality: Left;   CYSTOSCOPY/URETEROSCOPY/HOLMIUM LASER/STENT PLACEMENT  Left 05/02/2021   Procedure: CYSTOSCOPY/URETEROSCOPY WITH BRUSH BIOPSY/ RETROGRADE PYELOGRAM/ HOLMIUM LASER/STENT REPLACEMENT;  Surgeon: Franchot Gallo, MD;  Location: [name];  Service: Urology;  Laterality:  Left;   HOLMIUM LASER APPLICATION Left 45/0/3888   Procedure: HOLMIUM LASER APPLICATION;  Surgeon: Franchot Gallo, MD;  Location: [name];  Service: Urology;  Laterality: Left;   kidney removed  2006   right    RIGHT/LEFT HEART CATH AND CORONARY ANGIOGRAPHY N/A 11/06/2019   Procedure: RIGHT/LEFT HEART CATH AND CORONARY ANGIOGRAPHY;  Surgeon: Belva Crome, MD;  Location: [name];  Service: Cardiovascular;  Laterality: N/A;   THULIUM LASER TURP (TRANSURETHRAL RESECTION OF PROSTATE) Left 08/18/2019   Procedure: THULIUM LASER ABLATION OF URETERAL TUMOR;  Surgeon: Franchot Gallo, MD;  Location: [name];  Service: Urology;  Laterality: Left;   THULIUM LASER TURP (TRANSURETHRAL RESECTION OF PROSTATE) Left 11/17/2019   Procedure: THULIUM LASER of URETERAL CANCER;  Surgeon: Franchot Gallo, MD;  Location: [name];  Service: Urology;  Laterality: Left;    Allergies: Erythromycin and Lotensin [benazepril hcl]  Medications: Prior to Admission medications   Medication Sig Start Date End Date Taking? Authorizing Provider  acetaminophen (TYLENOL) 500 MG tablet Take 500 mg by mouth every 6 (six) hours as needed for moderate pain.   Yes [provider]  aspirin EC 81 MG tablet Take 1 tablet (81 mg total) by mouth daily. 11/03/19  Yes Nahser, Wonda Cheng, MD  calcium carbonate (TUMS - DOSED IN MG ELEMENTAL CALCIUM) 500 MG chewable tablet Chew 1 tablet by mouth every morning.   Yes [provider]  carvedilol (COREG) 6.25 MG tablet Take 6.25 mg by mouth 2 (two) times daily with a meal.   Yes [provider]  cholecalciferol (VITAMIN D3) 25 MCG (1000 UNIT) tablet Take 1,000 Units by mouth daily.   Yes [provider]  hydrALAZINE (APRESOLINE) 50 MG tablet TAKE 1 TABLET(50 MG) BY MOUTH EVERY 8 HOURS 04/29/21  Yes Chandrasekhar, Mahesh A, MD  rosuvastatin (CRESTOR) 10 MG tablet Take 1 tablet (10 mg total) by mouth daily.  04/25/21  Yes Chandrasekhar, Mahesh A, MD  sertraline (ZOLOFT) 50 MG tablet Take 50 mg by mouth at bedtime.   Yes [provider]  traZODone (DESYREL) 150 MG tablet Take 150 mg by mouth at bedtime. 08/24/20  Yes [provider]     Family History  Problem Relation Age of Onset   Hypertension Mother    Diabetes Mellitus II Sister    Hypertension Sister    Hypertension Brother     Social History   Socioeconomic History   Marital status: Married    Spouse name: Not on file   Number of children: Not on file   Years of education: Not on file   Highest education level: Not on file  Occupational History   Not on file  Tobacco Use   Smoking status: Former    Packs/day: 0.50    Years: 38.00    Pack years: 19.00    Types: Cigarettes    Quit date: 11/26/2003    Years since quitting: 17.6   Smokeless tobacco: Never  Vaping Use   Vaping Use: Never used  Substance and Sexual Activity   Alcohol use: No    Alcohol/week: 0.0 standard drinks   Drug use: No   Sexual activity: Not on file  Other Topics Concern   Not on file  Social History Narrative   Not on file  Social Determinants of Health   Financial Resource Strain: Not on file  Food Insecurity: Not on file  Transportation Needs: Not on file  Physical Activity: Not on file  Stress: Not on file  Social Connections: Not on file     Review of Systems: A 12 point ROS discussed and pertinent positives are indicated in the HPI above.  All other systems are negative.  Review of Systems  Constitutional:  Negative for fatigue and fever.  Respiratory:  Negative for cough and shortness of breath.   Cardiovascular:  Negative for chest pain.  Gastrointestinal:  Negative for abdominal pain, diarrhea and nausea.  Genitourinary:  Negative for dysuria and hematuria.  Musculoskeletal:  Negative for back pain.  Psychiatric/Behavioral:  Negative for behavioral problems and confusion.    Vital Signs: BP (!) 133/113    Pulse 61   Temp (!) 97.5 F (36.4 C) (Oral)   Resp 12   Ht 5\' 2"  (1.575 m)   Wt 175 lb (79.4 kg)   SpO2 97%   BMI 32.01 kg/m   Physical Exam Vitals and nursing note reviewed.  Constitutional:      General: She is not in acute distress.    Appearance: Normal appearance. She is not ill-appearing.  HENT:     Mouth/Throat:     Mouth: Mucous membranes are moist.     Pharynx: Oropharynx is clear.  Cardiovascular:     Rate and Rhythm: Normal rate and regular rhythm.  Pulmonary:     Effort: Pulmonary effort is normal. No respiratory distress.     Breath sounds: Normal breath sounds.  Abdominal:     General: Abdomen is flat. There is no distension.     Palpations: Abdomen is soft.     Tenderness: There is no abdominal tenderness.  Skin:    General: Skin is warm and dry.  Neurological:     General: No focal deficit present.     Mental Status: She is alert and oriented to person, place, and time. Mental status is at baseline.  Psychiatric:        Mood and Affect: Mood normal.        Behavior: Behavior normal.        Thought Content: Thought content normal.        Judgment: Judgment normal.     MD Evaluation Airway: WNL Heart: WNL Abdomen: WNL Chest/ Lungs: WNL ASA  Classification: 3 Mallampati/Airway Score: Two   Imaging: No results found.  Labs:  CBC: Recent Labs    09/16/20 0641 05/02/21 0633 07/06/21 1230  WBC  --   --  6.8  HGB 14.3 14.6 13.3  HCT 42.0 43.0 41.4  PLT  --   --  214    COAGS: Recent Labs    07/06/21 1230  INR 1.0    BMP: Recent Labs    09/16/20 0641 05/02/21 0633 07/06/21 1230  NA 139 142 133*  K 4.3 3.6 4.4  CL 100 99 96*  CO2  --   --  30  GLUCOSE 105* 103* 94  BUN 24* 15 15  CALCIUM  --   --  9.1  CREATININE 1.10* 1.30* 1.35*  GFRNONAA  --   --  41*    LIVER FUNCTION TESTS: Recent Labs    04/21/21 0809  BILITOT <0.2  AST 11  ALT 12  ALKPHOS 61  PROT 6.8  ALBUMIN 4.0    TUMOR MARKERS: No results for  input(s): AFPTM, CEA, CA199, CHROMGRNA in the  last 8760 hours.  Assessment and Plan: Patient with past medical history of urothelial cancer presents for left PCN placement for ongoing treatment under the direction of Dr. Diona Fanti.  Case reviewed by Dr. Anselm Pancoast who approves patient for procedure.  Patient presents today in their usual state of health.  She has been NPO and is not currently on blood thinners.   Risks and benefits of left PCN placement was discussed with the patient including, but not limited to, infection, bleeding, significant bleeding causing loss or decrease in renal function or damage to adjacent structures.   All of the patient's questions were answered, patient is agreeable to proceed.  Consent signed and in chart.   Thank you for this interesting consult.  I greatly enjoyed meeting [facility] and look forward to participating in their care.  A copy of this report was sent to the requesting provider on this date.  Electronically Signed: Docia Barrier, PA 07/06/2021, 2:12 PM   I spent a total of  30 Minutes   in face to face in clinical consultation, greater than 50% of which was counseling/coordinating care for urothelial cancer.

## 2021-07-21 DIAGNOSIS — C652 Malignant neoplasm of left renal pelvis: Secondary | ICD-10-CM | POA: Diagnosis not present

## 2021-07-21 DIAGNOSIS — C662 Malignant neoplasm of left ureter: Secondary | ICD-10-CM | POA: Diagnosis not present

## 2021-07-26 DIAGNOSIS — C652 Malignant neoplasm of left renal pelvis: Secondary | ICD-10-CM | POA: Diagnosis not present

## 2021-07-28 DIAGNOSIS — C652 Malignant neoplasm of left renal pelvis: Secondary | ICD-10-CM | POA: Diagnosis not present

## 2021-07-28 DIAGNOSIS — C662 Malignant neoplasm of left ureter: Secondary | ICD-10-CM | POA: Diagnosis not present

## 2021-08-04 DIAGNOSIS — C662 Malignant neoplasm of left ureter: Secondary | ICD-10-CM | POA: Diagnosis not present

## 2021-08-04 DIAGNOSIS — C652 Malignant neoplasm of left renal pelvis: Secondary | ICD-10-CM | POA: Diagnosis not present

## 2021-08-08 NOTE — Progress Notes (Signed)
Cardiology Office Note:    Date:  08/10/2021   ID:  [name], DOB Jun 09, 1946, MRN [id number]  PCP:  [name], MD   [place]  Cardiologist:  Werner Lean, MD  Advanced Practice Provider:  No care team member to display Electrophysiologist:  None      CC: follow up EF recovery  History of Present Illness:    [name] is a 75 y.o. female with a hx of moderate nonobstructive CAD (70% Diag disease 11/05/20), HFmrEF, HTN and HLD, who presents for evaluation 01/11/21.  Had ureteral carcinoma and see urology.  n interim had LVEF recovery.  Seen 08/10/21.  Patient notes that she is feeling tired.  Started on a new medication for ureteral carcinoma that makes her feel tired.    No chest pain or pressure.  No SOB, but with a bit of activity feels more short of breath since starting this medication above.  Has pain with her prior stents. No PND/Orthopnea.  No weight gain or leg swelling.  No palpitations or syncope .  Ambulatory blood pressure was 130 this morning; she thinks this may be inaccurate.   Past Medical History:  Diagnosis Date   Anxiety    CAD in native artery    a. cath 10/2019- mild to moderate dx >> medical therapy    Cancer ([place])    left ureteral cancer dx sept 2020   CHF (congestive heart failure) ([place])    Chronic kidney disease 06/2019   acute renal insufficiency sees dr Dorina Hoyer, ckd stage 3 per pt   COVID 09/2020   tired sore throat nausea hair fell out loss of taste and smell x 10 days, all symptoms resolved except still has loss of taste and smell   Depression    GERD (gastroesophageal reflux disease)    Hyperlipidemia    Hypertension     Past Surgical History:  Procedure Laterality Date   ABDOMINAL HYSTERECTOMY     partial   bladder-partial removal     CYSTOSCOPY W/ URETERAL STENT PLACEMENT Left 11/17/2019   Procedure: CYSTOSCOPY WITH STENT REPLACEMENT;  Surgeon: Franchot Gallo, MD;  Location:  [facility];  Service: Urology;  Laterality: Left;   CYSTOSCOPY W/ URETERAL STENT REMOVAL Left 05/10/2020   Procedure: CYSTOSCOPY WITH STENT REMOVAL;  Surgeon: Franchot Gallo, MD;  Location: [facility];  Service: Urology;  Laterality: Left;   CYSTOSCOPY W/ URETERAL STENT REMOVAL Left 05/02/2021   Procedure: CYSTOSCOPY WITH STENT REMOVAL;  Surgeon: Franchot Gallo, MD;  Location: [facility];  Service: Urology;  Laterality: Left;   CYSTOSCOPY WITH RETROGRADE PYELOGRAM, URETEROSCOPY AND STENT PLACEMENT Left 06/27/2019   Procedure: CYSTOSCOPY WITH LEFT RETROGRADE PYELOGRAM, URETEROSCOPY, BIOPSY AND LEFT STENT PLACEMENT;  Surgeon: Irine Seal, MD;  Location: [facility];  Service: Urology;  Laterality: Left;   CYSTOSCOPY WITH RETROGRADE PYELOGRAM, URETEROSCOPY AND STENT PLACEMENT Left 08/18/2019   Procedure: CYSTOSCOPY, URETEROSCOPY AND STENT EXCHANGE;  Surgeon: Franchot Gallo, MD;  Location: [facility];  Service: Urology;  Laterality: Left;  1 MINS   CYSTOSCOPY WITH RETROGRADE PYELOGRAM, URETEROSCOPY AND STENT PLACEMENT Left 11/17/2019   Procedure: CYSTOSCOPY WITH RETROGRADE PYELOGRAM, URETEROSCOPY AND STENT PLACEMENT;  Surgeon: Franchot Gallo, MD;  Location: [facility];  Service: Urology;  Laterality: Left;  54 MINS   CYSTOSCOPY WITH RETROGRADE PYELOGRAM, URETEROSCOPY AND STENT PLACEMENT Left 05/10/2020   Procedure: CYSTOSCOPY WITH RETROGRADE PYELOGRAM, URETEROSCOPY AND STENT PLACEMENT WITH URETHRAL DIALATION AND BRUSH  BIOPSY;  Surgeon: Franchot Gallo, MD;  Location: [facility];  Service: Urology;  Laterality: Left;  1 HR   CYSTOSCOPY WITH RETROGRADE PYELOGRAM, URETEROSCOPY AND STENT PLACEMENT Left 09/16/2020   Procedure: CYSTOSCOPY WITH RETROGRADE PYELOGRAM, URETEROSCOPY ,  LITHOPAXY, LEFT URETERAL BRUSHING, AND STENT REPLACEMENT;  Surgeon: Franchot Gallo, MD;  Location: [facility];  Service: Urology;   Laterality: Left;   CYSTOSCOPY/URETEROSCOPY/HOLMIUM LASER/STENT PLACEMENT Left 05/02/2021   Procedure: CYSTOSCOPY/URETEROSCOPY WITH BRUSH BIOPSY/ RETROGRADE PYELOGRAM/ HOLMIUM LASER/STENT REPLACEMENT;  Surgeon: Franchot Gallo, MD;  Location: [facility];  Service: Urology;  Laterality: Left;   HOLMIUM LASER APPLICATION Left 73/02/3298   Procedure: HOLMIUM LASER APPLICATION;  Surgeon: Franchot Gallo, MD;  Location: [facility];  Service: Urology;  Laterality: Left;   IR NEPHROSTOMY PLACEMENT LEFT  07/06/2021   kidney removed  2006   right    RIGHT/LEFT HEART CATH AND CORONARY ANGIOGRAPHY N/A 11/06/2019   Procedure: RIGHT/LEFT HEART CATH AND CORONARY ANGIOGRAPHY;  Surgeon: Belva Crome, MD;  Location: [facility];  Service: Cardiovascular;  Laterality: N/A;   THULIUM LASER TURP (TRANSURETHRAL RESECTION OF PROSTATE) Left 08/18/2019   Procedure: THULIUM LASER ABLATION OF URETERAL TUMOR;  Surgeon: Franchot Gallo, MD;  Location: [facility];  Service: Urology;  Laterality: Left;   THULIUM LASER TURP (TRANSURETHRAL RESECTION OF PROSTATE) Left 11/17/2019   Procedure: THULIUM LASER of URETERAL CANCER;  Surgeon: Franchot Gallo, MD;  Location: [facility];  Service: Urology;  Laterality: Left;    Current Medications: Current Meds  Medication Sig   acetaminophen (TYLENOL) 500 MG tablet Take 500 mg by mouth every 6 (six) hours as needed for moderate pain.   aspirin EC 81 MG tablet Take 1 tablet (81 mg total) by mouth daily.   calcium carbonate (TUMS - DOSED IN MG ELEMENTAL CALCIUM) 500 MG chewable tablet Chew 1 tablet by mouth every morning.   carvedilol (COREG) 6.25 MG tablet Take 6.25 mg by mouth 2 (two) times daily with a meal.   cholecalciferol (VITAMIN D3) 25 MCG (1000 UNIT) tablet Take 1,000 Units by mouth daily.   hydrALAZINE (APRESOLINE) 50 MG tablet TAKE 1 TABLET(50 MG) BY MOUTH EVERY 8 HOURS   rosuvastatin (CRESTOR) 10 MG tablet Take 1  tablet (10 mg total) by mouth daily.   sertraline (ZOLOFT) 50 MG tablet Take 50 mg by mouth at bedtime.   traZODone (DESYREL) 150 MG tablet Take 150 mg by mouth at bedtime.     Allergies:   Erythromycin and Lotensin [benazepril hcl]   Social History   Socioeconomic History   Marital status: Married    Spouse name: Not on file   Number of children: Not on file   Years of education: Not on file   Highest education level: Not on file  Occupational History   Not on file  Tobacco Use   Smoking status: Former    Packs/day: 0.50    Years: 38.00    Pack years: 19.00    Types: Cigarettes    Quit date: 11/26/2003    Years since quitting: 17.7   Smokeless tobacco: Never  Vaping Use   Vaping Use: Never used  Substance and Sexual Activity   Alcohol use: No    Alcohol/week: 0.0 standard drinks   Drug use: No   Sexual activity: Not on file  Other Topics Concern   Not on file  Social History Narrative   Not on file   Social Determinants of Health  Financial Resource Strain: Not on file  Food Insecurity: Not on file  Transportation Needs: Not on file  Physical Activity: Not on file  Stress: Not on file  Social Connections: Not on file     Family History: The patient's family history includes Diabetes Mellitus II in her sister; Hypertension in her brother, mother, and sister. History of coronary artery disease notable for no members. History of heart failure notable for no members. History of arrhythmia notable for no members.  ROS:   Please see the history of present illness.     All other systems reviewed and are negative.  EKGs/Labs/Other Studies Reviewed:    The following studies were reviewed today:  EKG:   11/24/20: SR 70 WNL  Transthoracic Echocardiogram: Date: 02/16/2021 Results:  1. Left ventricular ejection fraction, by estimation, is 70 to 75%. The  left ventricle has hyperdynamic function. The left ventricle has no  regional wall motion abnormalities.  Left ventricular diastolic parameters  are consistent with Grade I diastolic  dysfunction (impaired relaxation).   2. Right ventricular systolic function is normal. The right ventricular  size is normal. There is normal pulmonary artery systolic pressure. The  estimated right ventricular systolic pressure is 45.6 mmHg.   3. The mitral valve is normal in structure. No evidence of mitral valve  regurgitation. No evidence of mitral stenosis.   4. The aortic valve is calcified. There is mild calcification of the  aortic valve. There is mild thickening of the aortic valve. Aortic valve  regurgitation is not visualized. Mild aortic valve sclerosis is present,  with no evidence of aortic valve  stenosis.   5. The inferior vena cava is normal in size with greater than 50%  respiratory variability, suggesting right atrial pressure of 3 mmHg.   ECG or NM Stress Testing : Date: 06/30/19 Results: There was no ST segment deviation noted during stress. No T wave inversion was noted during stress. Defect 1: There is a medium defect of mild severity present in the mid anteroseptal, mid inferolateral, apical anterior, apical septal and apical lateral location. This is a low risk study. Nuclear stress EF: 63%. The left ventricular ejection fraction is normal (55-65%). No prior study for comparison.   There is a mild to moderate defect extending from mid anteroseptum to inferolateral wall. It improves with stress, which is not consistent with ischemia. Normal wall motion in this area. Not consistent with single coronary distrubution. However, TID is 1.21, which means balanced ischemia in multiple territories. cannot be excluded    Left/Right Heart Catheterizations: Date: 11/06/19 Results: Normal right heart pressures. The left main is short, and widely patent. Ostial to proximal LAD diffuse 40 to 50% narrowing. A moderate to large diagonal #1 contains 40 to 50% proximal segmental narrowing, mid 75%  narrowing after a bifurcation, and more distal 60% narrowing. The circumflex gives 3 obtuse marginal branches. The first moderate-sized obtuse marginal contains ostial eccentric 50 to 60% narrowing. The RCA contains eccentric 30 to 40% mid narrowing. No significant obstruction is noted otherwise. Normal left ventricular systolic function. EF greater than 50%. LVEDP is normal.   RECOMMENDATIONS:   The patient has moderate coronary disease, especially involving the first diagonal. The diagonal is relatively diffusely diseased but could be treated with PCI although it perhaps a slightly increased risk of complications due to the diffuse nature of disease. There is no disease that would account for prolonged pain chest pain at rest. If symptoms become nitroglycerin responsive consider diagonal PCI if  refractory and impacting quality of life. Aggressive risk factor modification    Recent Labs: 04/21/2021: ALT 12 07/06/2021: BUN 15; Creatinine, Ser 1.35; Hemoglobin 13.3; Platelets 214; Potassium 4.4; Sodium 133  Recent Lipid Panel    Component Value Date/Time   CHOL 181 04/21/2021 0809   TRIG 156 (H) 04/21/2021 0809   HDL 51 04/21/2021 0809   CHOLHDL 3.5 04/21/2021 0809   CHOLHDL 3.2 07/01/2019 0443   VLDL 23 07/01/2019 0443   LDLCALC 103 (H) 04/21/2021 0809   Physical Exam:    VS:  BP 120/60   Pulse 63   Ht 5\' 3"  (1.6 m)   Wt 176 lb (79.8 kg)   SpO2 97%   BMI 31.18 kg/m     Wt Readings from Last 3 Encounters:  08/10/21 176 lb (79.8 kg)  07/06/21 175 lb (79.4 kg)  05/02/21 172 lb 11.2 oz (78.3 kg)     GEN:  Well nourished, well developed in no acute distress HEENT: Normal NECK: No JVD LYMPHATICS: No lymphadenopathy CARDIAC: RRR, no murmurs, rubs, gallops RESPIRATORY:  Clear to auscultation without rales, wheezing or rhonchi  ABDOMEN: Soft, non-tender, non-distended MUSCULOSKELETAL:  No edema; No deformity  SKIN: Warm and dry NEUROLOGIC:  Alert and oriented x 3 PSYCHIATRIC:   Normal affect   ASSESSMENT:    1. CAD in native artery   2. HFrEF (heart failure with reduced ejection fraction) ([place])   3. Mixed hyperlipidemia   4. Essential hypertension     PLAN:    In order of problems listed above:  Coronary Artery Disease:  HLD - asymptomatic - anatomy: 70% Diag at worse - continue ASA 81 mg - rosuvastatin 5 mg PO Daily, goal LDL < 70 - will get lipids and ALTtoday - continue BB  Heart Failure Recovered Ejection Fraction  HTN - NYHA class I-II, Stage B, euvolemic, etiology unclear, HTN as a posisibility - Diuretic regimen: None needed - Coreg 6.25 mg PO BID - tolerated hydralazine 50 mg PO TID with LV recovery; if tired no longer on medication or low blood pressure, we will decrease this dose to 25 mg PO TID, may eventually space this to BID. - BNP and BMP today - ambulatory blood pressure still at goal, at next visit patient will bring cuff  Will plan for six months follow up unless new symptoms or abnormal test results warranting change in plan  Would be reasonable for  APP Follow up     Medication Adjustments/Labs and Tests Ordered: Current medicines are reviewed at length with the patient today.  Concerns regarding medicines are outlined above.  Orders Placed This Encounter  Procedures   Basic Metabolic Panel (BMET)   Pro b natriuretic peptide   ALT   Lipid Profile    No orders of the defined types were placed in this encounter.   Patient Instructions  Medication Instructions:  Your physician recommends that you continue on your current medications as directed. Please refer to the Current Medication list given to you today.  *If you need a refill on your cardiac medications before your next appointment, please call your pharmacy*   Lab Work: TODAY - pro-BNP, BMET, ALT, Lipid If you have labs (blood work) drawn today and your tests are completely normal, you will receive your results only by: [place] (if you have  MyChart) OR A paper copy in the mail If you have any lab test that is abnormal or we need to change your treatment, we will call you to  review the results.   Testing/Procedures: None Ordered   Follow-Up: At [facility], you and your health needs are our priority.  As part of our continuing mission to provide you with exceptional heart care, we have created designated Provider Care Teams.  These Care Teams include your primary Cardiologist (physician) and Advanced Practice Providers (APPs -  Physician Assistants and Nurse Practitioners) who all work together to provide you with the care you need, when you need it.   Your next appointment:   6 month(s)  The format for your next appointment:   In Person  Provider:   You may see Werner Lean, MD or one of the following Advanced Practice Providers on your designated Care Team:   Melina Copa, PA-C Ermalinda Barrios, PA-C    Signed, Werner Lean, MD  08/10/2021 8:25 AM    [place]

## 2021-08-10 ENCOUNTER — Ambulatory Visit (INDEPENDENT_AMBULATORY_CARE_PROVIDER_SITE_OTHER): Payer: Medicare Other | Admitting: Internal Medicine

## 2021-08-10 ENCOUNTER — Encounter: Payer: Self-pay | Admitting: Internal Medicine

## 2021-08-10 ENCOUNTER — Other Ambulatory Visit: Payer: Self-pay

## 2021-08-10 VITALS — BP 120/60 | HR 63 | Ht 63.0 in | Wt 176.0 lb

## 2021-08-10 DIAGNOSIS — I10 Essential (primary) hypertension: Secondary | ICD-10-CM

## 2021-08-10 DIAGNOSIS — E782 Mixed hyperlipidemia: Secondary | ICD-10-CM

## 2021-08-10 DIAGNOSIS — I2510 Atherosclerotic heart disease of native coronary artery without angina pectoris: Secondary | ICD-10-CM | POA: Diagnosis not present

## 2021-08-10 DIAGNOSIS — I5020 Unspecified systolic (congestive) heart failure: Secondary | ICD-10-CM | POA: Diagnosis not present

## 2021-08-10 NOTE — Patient Instructions (Signed)
Medication Instructions:  Your physician recommends that you continue on your current medications as directed. Please refer to the Current Medication list given to you today.  *If you need a refill on your cardiac medications before your next appointment, please call your pharmacy*   Lab Work: TODAY - pro-BNP, BMET, ALT, Lipid If you have labs (blood work) drawn today and your tests are completely normal, you will receive your results only by: [place] (if you have MyChart) OR A paper copy in the mail If you have any lab test that is abnormal or we need to change your treatment, we will call you to review the results.   Testing/Procedures: None Ordered   Follow-Up: At [facility], you and your health needs are our priority.  As part of our continuing mission to provide you with exceptional heart care, we have created designated Provider Care Teams.  These Care Teams include your primary Cardiologist (physician) and Advanced Practice Providers (APPs -  Physician Assistants and Nurse Practitioners) who all work together to provide you with the care you need, when you need it.   Your next appointment:   6 month(s)  The format for your next appointment:   In Person  Provider:   You may see Werner Lean, MD or one of the following Advanced Practice Providers on your designated Care Team:   Melina Copa, PA-C Ermalinda Barrios, PA-C

## 2021-08-11 DIAGNOSIS — C662 Malignant neoplasm of left ureter: Secondary | ICD-10-CM | POA: Diagnosis not present

## 2021-08-11 DIAGNOSIS — C652 Malignant neoplasm of left renal pelvis: Secondary | ICD-10-CM | POA: Diagnosis not present

## 2021-08-11 LAB — LIPID PANEL
Chol/HDL Ratio: 3.1 ratio (ref 0.0–4.4)
Cholesterol, Total: 159 mg/dL (ref 100–199)
HDL: 51 mg/dL (ref >39)
LDL Chol Calc (NIH): 82 mg/dL (ref 0–99)
Triglycerides: 148 mg/dL (ref 0–149)
VLDL Cholesterol Cal: 26 mg/dL (ref 5–40)

## 2021-08-11 LAB — BASIC METABOLIC PANEL
BUN/Creatinine Ratio: 13 (ref 12–28)
BUN: 15 mg/dL (ref 8–27)
CO2: 30 mmol/L — ABNORMAL HIGH (ref 20–29)
Calcium: 9.3 mg/dL (ref 8.7–10.3)
Chloride: 100 mmol/L (ref 96–106)
Creatinine, Ser: 1.2 mg/dL — ABNORMAL HIGH (ref 0.57–1.00)
Glucose: 95 mg/dL (ref 70–99)
Potassium: 3.9 mmol/L (ref 3.5–5.2)
Sodium: 141 mmol/L (ref 134–144)
eGFR: 48 mL/min/{1.73_m2} — ABNORMAL LOW (ref >59)

## 2021-08-11 LAB — ALT: ALT: 10 IU/L (ref 0–32)

## 2021-08-11 LAB — PRO B NATRIURETIC PEPTIDE: NT-Pro BNP: 157 pg/mL (ref 0–301)

## 2021-08-16 ENCOUNTER — Telehealth: Payer: Self-pay | Admitting: Internal Medicine

## 2021-08-16 NOTE — Telephone Encounter (Signed)
Patient wanted to know if the office could mail her a copy of her test results. The patient is having difficulty logging in to [place].  Mailing address as been verified and is accurate

## 2021-08-16 NOTE — Telephone Encounter (Signed)
Please see result note pt called message left for pt to call back.

## 2021-08-18 DIAGNOSIS — C652 Malignant neoplasm of left renal pelvis: Secondary | ICD-10-CM | POA: Diagnosis not present

## 2021-08-18 DIAGNOSIS — C662 Malignant neoplasm of left ureter: Secondary | ICD-10-CM | POA: Diagnosis not present

## 2021-08-22 ENCOUNTER — Telehealth: Payer: Self-pay

## 2021-08-22 DIAGNOSIS — E782 Mixed hyperlipidemia: Secondary | ICD-10-CM

## 2021-08-22 DIAGNOSIS — I2510 Atherosclerotic heart disease of native coronary artery without angina pectoris: Secondary | ICD-10-CM

## 2021-08-22 MED ORDER — ROSUVASTATIN CALCIUM 20 MG PO TABS: 20.0000 mg | ORAL_TABLET | Freq: Every day | ORAL | 3 refills | Status: DC

## 2021-08-22 NOTE — Telephone Encounter (Signed)
-----   Message from Werner Lean, MD sent at 08/14/2021  3:01 PM EST ----- Results: Creatinine has improved Normal NTBNP LDL above goal Plan: Increase rosuvastatin from 10 to 20 mg po daily, labs in three months  Werner Lean, MD

## 2021-08-22 NOTE — Telephone Encounter (Signed)
Reviewed results and MD recommendations.  Pt is agreeable to plan all questions answered and orders placed.

## 2021-08-25 DIAGNOSIS — C652 Malignant neoplasm of left renal pelvis: Secondary | ICD-10-CM | POA: Diagnosis not present

## 2021-08-25 DIAGNOSIS — C662 Malignant neoplasm of left ureter: Secondary | ICD-10-CM | POA: Diagnosis not present

## 2021-08-29 DIAGNOSIS — C662 Malignant neoplasm of left ureter: Secondary | ICD-10-CM | POA: Diagnosis not present

## 2021-08-29 DIAGNOSIS — T8140XD Infection following a procedure, unspecified, subsequent encounter: Secondary | ICD-10-CM | POA: Diagnosis not present

## 2021-08-31 DIAGNOSIS — T83512A Infection and inflammatory reaction due to nephrostomy catheter, initial encounter: Secondary | ICD-10-CM | POA: Diagnosis not present

## 2021-09-07 DIAGNOSIS — C652 Malignant neoplasm of left renal pelvis: Secondary | ICD-10-CM | POA: Diagnosis not present

## 2021-09-07 DIAGNOSIS — C662 Malignant neoplasm of left ureter: Secondary | ICD-10-CM | POA: Diagnosis not present

## 2021-09-12 ENCOUNTER — Other Ambulatory Visit: Payer: Self-pay | Admitting: Urology

## 2021-10-17 DIAGNOSIS — R8279 Other abnormal findings on microbiological examination of urine: Secondary | ICD-10-CM | POA: Diagnosis not present

## 2021-10-17 DIAGNOSIS — C662 Malignant neoplasm of left ureter: Secondary | ICD-10-CM | POA: Diagnosis not present

## 2021-10-18 ENCOUNTER — Encounter (HOSPITAL_BASED_OUTPATIENT_CLINIC_OR_DEPARTMENT_OTHER): Payer: Self-pay | Admitting: Urology

## 2021-10-18 ENCOUNTER — Other Ambulatory Visit: Payer: Self-pay

## 2021-10-18 NOTE — Progress Notes (Signed)
Spoke w/ via phone for pre-op interview--- [place]----   NONE            Lab results------ COVID test -----patient states asymptomatic no test needed Arrive at ------- 0600 NPO after MN NO Solid Food.  Med rec completed Medications to take morning of surgery ----- Coreg and Hydralizine Diabetic medication ----- Patient instructed no nail polish to be worn day of surgery Patient instructed to bring photo id and insurance card day of surgery Patient aware to have Driver (ride ) / caregiver   [name] (Husband)  for 24 hours after surgery  Patient Special Instructions ----- Pre-Op special Istructions ----- Patient verbalized understanding of instructions that were given at this phone interview. Patient denies shortness of breath, chest pain, fever, cough at this phone interview. Anesthesia Review:  PCP: Dr. Lindell Noe Cardiologist :Gasper Sells Chest x-ray : EKG :11/2020 Echo :EF 70-75% Stress test: Cardiac Cath : 2021 Activity level:  Sleep Study/ CPAP : Fasting Blood Sugar :      / Checks Blood Sugar -- times a day:   Blood Thinner/ Instructions /Last Dose: ASA / Instructions/ Last Dose :  Hold ASA five days prior to surgery.

## 2021-10-23 NOTE — H&P (Signed)
H&P  Chief Complaint: TCCa LT kidney/ureter  History of Present Illness: [name] is a 76 y.o. 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 Margins were negative. Tumor size was 1.5 cm. She also had a bladder tumor resected shortly before that.   10.2.2020: Here for follow-up on her recent hospitalization. She presented with multiple medical issues, edema and left hydronephrosis. She was found to have probable tumor in her left ureter which was confirmed on eventual ureteroscopy with ureteral biopsy on 9.18.2020 by Dr. Irine Seal. Pathology revealed high-grade urothelial carcinoma.   2.8.2021: Cysto/stent pull/URS/thulium laser of residual tumor in distal ureter. Small volume tumor seen. No proximal involvement.   4.20.2021: Completed 6 induction BCG infusions w/ J2 stent left in.   6.16.2021: She tolerated her BCG treatments well. She had no specific dysuria and denies hematuria. Currently, no urinary issues.   8.2.2021: She underwent cystoscopy, left double-J stent extraction,   12.9.2022: she underwent left ureteroscopy and brushing of her left ureter. Ureteral brushings revealed only atypia. She had no obvious urothelial carcinoma in the renal pelvis or ureter although she did have a stricture associated with her prior his treatment site.   7.25.2022: she underwent cystoscopy, left retrograde following stent extraction, left ureteroscopy, brush biopsy and laser fulguration of what appeared to be a a ureteral recurrence of her urothelial carcinoma, left renal washings and laser ablation of a 2-3 mm left upper calyceal papillary lesions. Cytology of left renal washings was positive for high-grade urothelial carcinoma, brush biopsy cytology of her ureteral lesion was negative.   11.17.2022: Completed 6 Gelmyto treatments through Lt nephrostomy tube.  1.16.2023: Here for Lt  URS/possible laser of any remaining urothelial lesions/J2 stent.  Past Medical History:  Diagnosis Date   Anxiety    CAD in native artery    a. cath 10/2019- mild to moderate dx >> medical therapy    Cancer ([place])    left ureteral cancer dx sept 2020   CHF (congestive heart failure) ([place])    Chronic kidney disease 06/2019   acute renal insufficiency sees dr Dorina Hoyer, ckd stage 3 per pt   COVID 09/2020   tired sore throat nausea hair fell out loss of taste and smell x 10 days, all symptoms resolved except still has loss of taste and smell   Depression    GERD (gastroesophageal reflux disease)    Hyperlipidemia    Hypertension     Past Surgical History:  Procedure Laterality Date   ABDOMINAL HYSTERECTOMY     partial   bladder-partial removal     CYSTOSCOPY W/ URETERAL STENT PLACEMENT Left 11/17/2019   Procedure: CYSTOSCOPY WITH STENT REPLACEMENT;  Surgeon: Franchot Gallo, MD;  Location[facility];  Service: Urology;  Laterality: Left;   CYSTOSCOPY W/ URETERAL STENT REMOVAL Left 05/10/2020   Procedure: CYSTOSCOPY WITH STENT REMOVAL;  Surgeon: Franchot Gallo, MD;  Location: [facility];  Service: Urology;  Laterality: Left;   CYSTOSCOPY W/ URETERAL STENT REMOVAL Left 05/02/2021   Procedure: CYSTOSCOPY WITH STENT REMOVAL;  Surgeon: Franchot Gallo, MD;  Location: [facility];  Service: Urology;  Laterality: Left;   CYSTOSCOPY WITH RETROGRADE PYELOGRAM, URETEROSCOPY AND STENT PLACEMENT Left 06/27/2019   Procedure: CYSTOSCOPY WITH LEFT RETROGRADE PYELOGRAM, URETEROSCOPY, BIOPSY AND LEFT STENT PLACEMENT;  Surgeon: Irine Seal, MD;  Location: [facility];  Service: Urology;  Laterality: Left;  CYSTOSCOPY WITH RETROGRADE PYELOGRAM, URETEROSCOPY AND STENT PLACEMENT Left 08/18/2019   Procedure: CYSTOSCOPY, URETEROSCOPY AND STENT EXCHANGE;  Surgeon: Franchot Gallo, MD;  Location: [facility];  Service: Urology;  Laterality: Left;  22 MINS   CYSTOSCOPY  WITH RETROGRADE PYELOGRAM, URETEROSCOPY AND STENT PLACEMENT Left 11/17/2019   Procedure: CYSTOSCOPY WITH RETROGRADE PYELOGRAM, URETEROSCOPY AND STENT PLACEMENT;  Surgeon: Franchot Gallo, MD;  Location: [facility];  Service: Urology;  Laterality: Left;  62 MINS   CYSTOSCOPY WITH RETROGRADE PYELOGRAM, URETEROSCOPY AND STENT PLACEMENT Left 05/10/2020   Procedure: CYSTOSCOPY WITH RETROGRADE PYELOGRAM, URETEROSCOPY AND STENT PLACEMENT WITH URETHRAL DIALATION AND BRUSH BIOPSY;  Surgeon: Franchot Gallo, MD;  Location: [facility];  Service: Urology;  Laterality: Left;  1 HR   CYSTOSCOPY WITH RETROGRADE PYELOGRAM, URETEROSCOPY AND STENT PLACEMENT Left 09/16/2020   Procedure: CYSTOSCOPY WITH RETROGRADE PYELOGRAM, URETEROSCOPY ,  LITHOPAXY, LEFT URETERAL BRUSHING, AND STENT REPLACEMENT;  Surgeon: Franchot Gallo, MD;  Location: [facility];  Service: Urology;  Laterality: Left;   CYSTOSCOPY/URETEROSCOPY/HOLMIUM LASER/STENT PLACEMENT Left 05/02/2021   Procedure: CYSTOSCOPY/URETEROSCOPY WITH BRUSH BIOPSY/ RETROGRADE PYELOGRAM/ HOLMIUM LASER/STENT REPLACEMENT;  Surgeon: Franchot Gallo, MD;  Location: [facility];  Service: Urology;  Laterality: Left;   HOLMIUM LASER APPLICATION Left 83/12/8248   Procedure: HOLMIUM LASER APPLICATION;  Surgeon: Franchot Gallo, MD;  Location: [facility];  Service: Urology;  Laterality: Left;   IR NEPHROSTOMY PLACEMENT LEFT  07/06/2021   kidney removed  2006   right    RIGHT/LEFT HEART CATH AND CORONARY ANGIOGRAPHY N/A 11/06/2019   Procedure: RIGHT/LEFT HEART CATH AND CORONARY ANGIOGRAPHY;  Surgeon: Belva Crome, MD;  Location: [place] [facility];  Service: Cardiovascular;  Laterality: N/A;   THULIUM LASER TURP (TRANSURETHRAL RESECTION OF PROSTATE) Left 08/18/2019   Procedure: THULIUM LASER ABLATION OF URETERAL TUMOR;  Surgeon: Franchot Gallo, MD;  Location: [facility];  Service: Urology;   Laterality: Left;   THULIUM LASER TURP (TRANSURETHRAL RESECTION OF PROSTATE) Left 11/17/2019   Procedure: THULIUM LASER of URETERAL CANCER;  Surgeon: Franchot Gallo, MD;  Location: [facility];  Service: Urology;  Laterality: Left;    Home Medications:  No medications prior to admission.    Allergies:  Allergies  Allergen Reactions   Erythromycin Diarrhea and Nausea And Vomiting   Lotensin [Benazepril Hcl] Cough    Family History  Problem Relation Age of Onset   Hypertension Mother    Diabetes Mellitus II Sister    Hypertension Sister    Hypertension Brother     Social History:  reports that she quit smoking about 17 years ago. Her smoking use included cigarettes. She has a 19.00 pack-year smoking history. She has never used smokeless tobacco. She reports that she does not drink alcohol and does not use drugs.  ROS: A complete review of systems was performed.  All systems are negative except for pertinent findings as noted.  Physical Exam:  Vital signs in last 24 hours:   General:  Alert and oriented, No acute distress HEENT: Normocephalic, atraumatic Neck: No JVD or lymphadenopathy Cardiovascular: Regular rate  Extremities: No edema Neurologic: Grossly intact  I have reviewed prior pt notes  I have reviewed notes from referring/previous physicians  I have reviewed urinalysis results  I have independently reviewed prior imaging  I have reviewed prior urine culture   Impression/Assessment:  TCCa LT ureter/ renal pelvis  Plan:  Cysto, Lt RGP, Lt URS, possible bx/laser of urothelial lesions, Lt J2 stent  [name] 10/23/2021, 4:36 PM  Heather Boxer. Finnbar Cedillos MD

## 2021-10-24 ENCOUNTER — Ambulatory Visit (HOSPITAL_BASED_OUTPATIENT_CLINIC_OR_DEPARTMENT_OTHER): Payer: Medicare PPO | Admitting: Anesthesiology

## 2021-10-24 ENCOUNTER — Other Ambulatory Visit: Payer: Self-pay

## 2021-10-24 ENCOUNTER — Encounter (HOSPITAL_BASED_OUTPATIENT_CLINIC_OR_DEPARTMENT_OTHER)
Admission: RE | Disposition: A | Discharge status: Home / Self Care | Payer: Self-pay | Source: Home / Self Care | Attending: Urology

## 2021-10-24 ENCOUNTER — Ambulatory Visit (HOSPITAL_BASED_OUTPATIENT_CLINIC_OR_DEPARTMENT_OTHER)
Admission: RE | Admit: 2021-10-24 | Discharge: 2021-10-24 | Disposition: A | Discharge status: Home / Self Care | Payer: Medicare PPO | Attending: Urology | Admitting: Urology

## 2021-10-24 ENCOUNTER — Encounter (HOSPITAL_BASED_OUTPATIENT_CLINIC_OR_DEPARTMENT_OTHER): Payer: Self-pay | Admitting: Urology

## 2021-10-24 DIAGNOSIS — K219 Gastro-esophageal reflux disease without esophagitis: Secondary | ICD-10-CM | POA: Diagnosis not present

## 2021-10-24 DIAGNOSIS — I509 Heart failure, unspecified: Secondary | ICD-10-CM | POA: Insufficient documentation

## 2021-10-24 DIAGNOSIS — I2510 Atherosclerotic heart disease of native coronary artery without angina pectoris: Secondary | ICD-10-CM | POA: Insufficient documentation

## 2021-10-24 DIAGNOSIS — Z906 Acquired absence of other parts of urinary tract: Secondary | ICD-10-CM | POA: Insufficient documentation

## 2021-10-24 DIAGNOSIS — Z87891 Personal history of nicotine dependence: Secondary | ICD-10-CM | POA: Insufficient documentation

## 2021-10-24 DIAGNOSIS — I130 Hypertensive heart and chronic kidney disease with heart failure and stage 1 through stage 4 chronic kidney disease, or unspecified chronic kidney disease: Secondary | ICD-10-CM | POA: Diagnosis not present

## 2021-10-24 DIAGNOSIS — Z8554 Personal history of malignant neoplasm of ureter: Secondary | ICD-10-CM | POA: Insufficient documentation

## 2021-10-24 DIAGNOSIS — Z6830 Body mass index (BMI) 30.0-30.9, adult: Secondary | ICD-10-CM | POA: Diagnosis not present

## 2021-10-24 DIAGNOSIS — I110 Hypertensive heart disease with heart failure: Secondary | ICD-10-CM | POA: Diagnosis not present

## 2021-10-24 DIAGNOSIS — N289 Disorder of kidney and ureter, unspecified: Secondary | ICD-10-CM | POA: Diagnosis not present

## 2021-10-24 DIAGNOSIS — C662 Malignant neoplasm of left ureter: Secondary | ICD-10-CM | POA: Diagnosis not present

## 2021-10-24 DIAGNOSIS — C642 Malignant neoplasm of left kidney, except renal pelvis: Secondary | ICD-10-CM | POA: Diagnosis not present

## 2021-10-24 DIAGNOSIS — E669 Obesity, unspecified: Secondary | ICD-10-CM | POA: Diagnosis not present

## 2021-10-24 DIAGNOSIS — D4112 Neoplasm of uncertain behavior of left renal pelvis: Secondary | ICD-10-CM | POA: Diagnosis not present

## 2021-10-24 DIAGNOSIS — Z8553 Personal history of malignant neoplasm of renal pelvis: Secondary | ICD-10-CM | POA: Diagnosis not present

## 2021-10-24 DIAGNOSIS — N183 Chronic kidney disease, stage 3 unspecified: Secondary | ICD-10-CM | POA: Diagnosis not present

## 2021-10-24 DIAGNOSIS — Z8616 Personal history of COVID-19: Secondary | ICD-10-CM | POA: Diagnosis not present

## 2021-10-24 HISTORY — PX: HOLMIUM LASER APPLICATION: SHX5852

## 2021-10-24 HISTORY — PX: CYSTOSCOPY WITH RETROGRADE PYELOGRAM, URETEROSCOPY AND STENT PLACEMENT: SHX5789

## 2021-10-24 SURGERY — CYSTOURETEROSCOPY, WITH RETROGRADE PYELOGRAM AND STENT INSERTION
Anesthesia: General | Site: Ureter | Laterality: Left

## 2021-10-24 MED ORDER — LIDOCAINE HCL (PF) 2 % IJ SOLN
INTRAMUSCULAR | Status: AC
  Filled 2021-10-24: qty 5

## 2021-10-24 MED ORDER — ONDANSETRON HCL 4 MG/2ML IJ SOLN
INTRAMUSCULAR | Status: AC
  Filled 2021-10-24: qty 2

## 2021-10-24 MED ORDER — EPHEDRINE 5 MG/ML INJ
INTRAVENOUS | Status: AC
  Filled 2021-10-24: qty 5

## 2021-10-24 MED ORDER — EPHEDRINE SULFATE-NACL 50-0.9 MG/10ML-% IV SOSY
PREFILLED_SYRINGE | INTRAVENOUS | Status: DC | PRN
  Administered 2021-10-24: 15 mg via INTRAVENOUS
  Administered 2021-10-24: 10 mg via INTRAVENOUS

## 2021-10-24 MED ORDER — ACETAMINOPHEN 500 MG PO TABS: 1000.0000 mg | ORAL_TABLET | Freq: Once | ORAL | Status: DC

## 2021-10-24 MED ORDER — OXYCODONE HCL 5 MG PO TABS
5.0000 mg | ORAL_TABLET | Freq: Once | ORAL | Status: DC | PRN
Start: 2021-10-24 — End: 2021-10-24

## 2021-10-24 MED ORDER — FENTANYL CITRATE (PF) 100 MCG/2ML IJ SOLN: 25.0000 ug | INTRAMUSCULAR | Status: DC | PRN

## 2021-10-24 MED ORDER — FENTANYL CITRATE (PF) 100 MCG/2ML IJ SOLN
INTRAMUSCULAR | Status: AC
  Filled 2021-10-24: qty 2

## 2021-10-24 MED ORDER — CEPHALEXIN 500 MG PO CAPS: 500.0000 mg | ORAL_CAPSULE | Freq: Two times a day (BID) | ORAL | 0 refills | Status: AC

## 2021-10-24 MED ORDER — CEFAZOLIN SODIUM-DEXTROSE 2-4 GM/100ML-% IV SOLN
2.0000 g | INTRAVENOUS | Status: AC
  Administered 2021-10-24: 2 g via INTRAVENOUS

## 2021-10-24 MED ORDER — SODIUM CHLORIDE 0.9 % IR SOLN
Status: DC | PRN
  Administered 2021-10-24: 3000 mL

## 2021-10-24 MED ORDER — IOHEXOL 300 MG/ML  SOLN
INTRAMUSCULAR | Status: DC | PRN
  Administered 2021-10-24: 10 mL

## 2021-10-24 MED ORDER — CEFAZOLIN SODIUM-DEXTROSE 2-4 GM/100ML-% IV SOLN
INTRAVENOUS | Status: AC
  Filled 2021-10-24: qty 100

## 2021-10-24 MED ORDER — LACTATED RINGERS IV SOLN: INTRAVENOUS | Status: DC

## 2021-10-24 MED ORDER — PROPOFOL 10 MG/ML IV BOLUS
INTRAVENOUS | Status: AC
  Filled 2021-10-24: qty 20

## 2021-10-24 MED ORDER — ACETAMINOPHEN 500 MG PO TABS: 1000.0000 mg | ORAL_TABLET | Freq: Once | ORAL | Status: DC | PRN

## 2021-10-24 MED ORDER — PHENYLEPHRINE 40 MCG/ML (10ML) SYRINGE FOR IV PUSH (FOR BLOOD PRESSURE SUPPORT)
PREFILLED_SYRINGE | INTRAVENOUS | Status: DC | PRN
  Administered 2021-10-24: 80 ug via INTRAVENOUS
  Administered 2021-10-24: 40 ug via INTRAVENOUS

## 2021-10-24 MED ORDER — ONDANSETRON HCL 4 MG/2ML IJ SOLN
INTRAMUSCULAR | Status: DC | PRN
  Administered 2021-10-24: 4 mg via INTRAVENOUS

## 2021-10-24 MED ORDER — OXYCODONE HCL 5 MG/5ML PO SOLN: 5.0000 mg | Freq: Once | ORAL | Status: DC | PRN

## 2021-10-24 MED ORDER — PROPOFOL 10 MG/ML IV BOLUS
INTRAVENOUS | Status: DC | PRN
  Administered 2021-10-24: 160 mg via INTRAVENOUS

## 2021-10-24 MED ORDER — ACETAMINOPHEN 160 MG/5ML PO SOLN: 1000.0000 mg | Freq: Once | ORAL | Status: DC | PRN

## 2021-10-24 MED ORDER — LIDOCAINE 2% (20 MG/ML) 5 ML SYRINGE
INTRAMUSCULAR | Status: DC | PRN
  Administered 2021-10-24: 60 mg via INTRAVENOUS

## 2021-10-24 MED ORDER — FENTANYL CITRATE (PF) 100 MCG/2ML IJ SOLN
INTRAMUSCULAR | Status: DC | PRN
  Administered 2021-10-24: 50 ug via INTRAVENOUS

## 2021-10-24 MED ORDER — PHENYLEPHRINE 40 MCG/ML (10ML) SYRINGE FOR IV PUSH (FOR BLOOD PRESSURE SUPPORT)
PREFILLED_SYRINGE | INTRAVENOUS | Status: AC
  Filled 2021-10-24: qty 10

## 2021-10-24 MED ORDER — ACETAMINOPHEN 10 MG/ML IV SOLN: 1000.0000 mg | Freq: Once | INTRAVENOUS | Status: DC | PRN

## 2021-10-24 SURGICAL SUPPLY — 33 items
BAG DRAIN URO-CYSTO SKYTR STRL (DRAIN) ×2 IMPLANT
BAG DRN UROCATH (DRAIN) ×1
BASKET ZERO TIP NITINOL 2.4FR (BASKET) IMPLANT
BRUSH URET BIOPSY 3F (UROLOGICAL SUPPLIES) ×1 IMPLANT
BSKT STON RTRVL ZERO TP 2.4FR (BASKET)
CATH INTERMIT  6FR 70CM (CATHETERS) ×2 IMPLANT
CLOTH BEACON ORANGE TIMEOUT ST (SAFETY) ×2 IMPLANT
CNTNR URN SCR LID CUP LEK RST (MISCELLANEOUS) IMPLANT
CONT SPEC 4OZ STRL OR WHT (MISCELLANEOUS) ×4
COVER DOME SNAP 22 D (MISCELLANEOUS) ×2 IMPLANT
ELECT REM PT RETURN 9FT ADLT (ELECTROSURGICAL)
ELECTRODE REM PT RTRN 9FT ADLT (ELECTROSURGICAL) IMPLANT
FIBER LASER FLEXIVA 200 (UROLOGICAL SUPPLIES) IMPLANT
FIBER LASER FLEXIVA 365 (UROLOGICAL SUPPLIES) IMPLANT
GLOVE SURG ENC MOIS LTX SZ8 (GLOVE) ×2 IMPLANT
GOWN STRL REUS W/TWL XL LVL3 (GOWN DISPOSABLE) ×2 IMPLANT
GUIDEWIRE ANG ZIPWIRE 038X150 (WIRE) IMPLANT
GUIDEWIRE STR DUAL SENSOR (WIRE) ×1 IMPLANT
IV NS IRRIG 3000ML ARTHROMATIC (IV SOLUTION) ×3 IMPLANT
KIT TURNOVER CYSTO (KITS) ×2 IMPLANT
MANIFOLD NEPTUNE II (INSTRUMENTS) ×2 IMPLANT
NDL SAFETY ECLIPSE 18X1.5 (NEEDLE) IMPLANT
NEEDLE HYPO 18GX1.5 SHARP (NEEDLE) ×4
NS IRRIG 500ML POUR BTL (IV SOLUTION) ×2 IMPLANT
PACK CYSTO (CUSTOM PROCEDURE TRAY) ×2 IMPLANT
SHEATH URETERAL 12FRX28CM (UROLOGICAL SUPPLIES) ×1 IMPLANT
SHEATH URETERAL 12FRX35CM (MISCELLANEOUS) IMPLANT
STENT URET 6FRX26 CONTOUR (STENTS) ×1 IMPLANT
SYR 10ML LL (SYRINGE) ×1 IMPLANT
TRACTIP FLEXIVA PULS ID 200XHI (Laser) IMPLANT
TRACTIP FLEXIVA PULSE ID 200 (Laser) ×2
TUBE CONNECTING 12X1/4 (SUCTIONS) IMPLANT
TUBING UROLOGY SET (TUBING) IMPLANT

## 2021-10-24 NOTE — Discharge Instructions (Addendum)
You may see some blood in the urine and may have some burning with urination for 48-72 hours. You also may notice that you have to urinate more frequently or urgently after your procedure which is normal.  You should call should you develop an inability urinate, fever > 101, persistent nausea and vomiting that prevents you from eating or drinking to stay hydrated.  If you have a stent, you will likely urinate more frequently and urgently until the stent is removed and you may experience some discomfort/pain in the lower abdomen and flank especially when urinating. You may take pain medication prescribed to you if needed for pain. You may also intermittently have blood in the urine until the stent is removed. [facility] [phone] Post Ureteroscopy With or Without Stent Instructions  Definitions:  Ureter: The duct that transports urine from the kidney to the bladder. Stent:   A plastic hollow tube that is placed into the ureter, from the kidney to the bladder to prevent the ureter from swelling shut.  GENERAL INSTRUCTIONS:  Despite the fact that no skin incisions were used, the area around the ureter and bladder is raw and irritated. The stent is a foreign body which will further irritate the bladder wall. This irritation is manifested by increased frequency of urination, both day and night, and by an increase in the urge to urinate. In some, the urge to urinate is present almost always. Sometimes the urge is strong enough that you may not be able to stop yourself from urinating. The only real cure is to remove the stent and then give time for the bladder wall to heal which can't be done until the danger of the ureter swelling shut has passed, which varies.  You may see some blood in your urine while the stent is in place and a few days afterwards. Do not be alarmed, even if the urine was clear for a while. Get off your feet and drink lots of fluids until clearing occurs. If you  start to pass clots or don't improve, call us.  DIET: You may return to your normal diet immediately. Because of the raw surface of your bladder, alcohol, spicy foods, acid type foods and drinks with caffeine may cause irritation or frequency and should be used in moderation. To keep your urine flowing freely and to avoid constipation, drink plenty of fluids during the day ( 8-10 glasses ). Tip: Avoid cranberry juice because it is very acidic.  ACTIVITY: Your physical activity doesn't need to be restricted. However, if you are very active, you may see some blood in your urine. We suggest that you reduce your activity under these circumstances until the bleeding has stopped.  BOWELS: It is important to keep your bowels regular during the postoperative period. Straining with bowel movements can cause bleeding. A bowel movement every other day is reasonable. Use a mild laxative if needed, such as Milk of Magnesia 2-3 tablespoons, or 2 Dulcolax tablets. Call if you continue to have problems. If you have been taking narcotics for pain, before, during or after your surgery, you may be constipated. Take a laxative if necessary.   MEDICATION: You should resume your pre-surgery medications unless told not to. In addition you will often be given an antibiotic to prevent infection. These should be taken as prescribed until the bottles are finished unless you are having an unusual reaction to one of the drugs.  PROBLEMS YOU SHOULD REPORT TO [place]: Fevers over 100.5 Fahrenheit. Heavy bleeding, or  clots ( See above notes about blood in urine ). Inability to urinate. Drug reactions ( hives, rash, nausea, vomiting, diarrhea ). Severe burning or pain with urination that is not improving.  FOLLOW-UP: You will need a follow-up appointment to monitor your progress. Call for this appointment at the number listed above. Usually the first appointment will be about three to fourteen days after your surgery.  Post  Anesthesia Home Care Instructions  Activity: Get plenty of rest for the remainder of the day. A responsible individual must stay with you for 24 hours following the procedure.  For the next 24 hours, DO NOT: -Drive a car -Paediatric nurse -Drink alcoholic beverages -Take any medication unless instructed by your physician -Make any legal decisions or sign important papers.  Meals: Start with liquid foods such as gelatin or soup. Progress to regular foods as tolerated. Avoid greasy, spicy, heavy foods. If nausea and/or vomiting occur, drink only clear liquids until the nausea and/or vomiting subsides. Call your physician if vomiting continues.  Special Instructions/Symptoms: Your throat may feel dry or sore from the anesthesia or the breathing tube placed in your throat during surgery. If this causes discomfort, gargle with warm salt water. The discomfort should disappear within 24 hours.

## 2021-10-24 NOTE — Interval H&P Note (Signed)
History and Physical Interval Note:  10/24/2021 8:04 AM  [name]  has presented today for surgery, with the diagnosis of LEFT URETERAL AND LEFT RENAL CANCER.  The various methods of treatment have been discussed with the patient and family. After consideration of risks, benefits and other options for treatment, the patient has consented to  Procedure(s) with comments: CYSTOSCOPY WITH RETROGRADE PYELOGRAM, URETEROSCOPY, POSSIBLE URETERAL AND RENAL BIOPSIES AND STENT PLACEMENT (Left) - 1 HR HOLMIUM LASER APPLICATION OF TUMORS (Left) as a surgical intervention.  The patient's history has been reviewed, patient examined, no change in status, stable for surgery.  I have reviewed the patient's chart and labs.  Questions were answered to the patient's satisfaction.     [name]

## 2021-10-24 NOTE — Anesthesia Postprocedure Evaluation (Signed)
Anesthesia Post Note  Patient: [name]  Procedure(s) Performed: CYSTOSCOPY WITH RETROGRADE PYELOGRAM, URETEROSCOPY, POSSIBLE URETERAL AND RENAL BIOPSIES AND STENT PLACEMENT (Left: Ureter) HOLMIUM LASER APPLICATION OF TUMORS (Left: Ureter)     Patient location during evaluation: PACU Anesthesia Type: General Level of consciousness: awake and alert Pain management: pain level controlled Vital Signs Assessment: post-procedure vital signs reviewed and stable Respiratory status: spontaneous breathing, nonlabored ventilation, respiratory function stable and patient connected to nasal cannula oxygen Cardiovascular status: blood pressure returned to baseline and stable Postop Assessment: no apparent nausea or vomiting Anesthetic complications: no   No notable events documented.  Last Vitals:  Vitals:   10/24/21 0930 10/24/21 0945  BP: (!) 121/59 125/66  Pulse: 67 68  Resp: 14 14  Temp:  (!) 36.4 C  SpO2: 94% 95%    Last Pain:  Vitals:   10/24/21 0902  TempSrc:   PainSc: 7                  Corleen Otwell S

## 2021-10-24 NOTE — Anesthesia Procedure Notes (Signed)
Procedure Name: LMA Insertion Date/Time: 10/24/2021 8:17 AM Performed by: Suan Halter, CRNA Pre-anesthesia Checklist: Patient identified, Emergency Drugs available, Suction available and Patient being monitored Patient Re-evaluated:Patient Re-evaluated prior to induction Oxygen Delivery Method: Circle system utilized Preoxygenation: Pre-oxygenation with 100% oxygen Induction Type: IV induction Ventilation: Mask ventilation without difficulty LMA: LMA inserted LMA Size: 4.0 Number of attempts: 1 Airway Equipment and Method: Bite block Placement Confirmation: positive ETCO2 Tube secured with: Tape Dental Injury: Teeth and Oropharynx as per pre-operative assessment

## 2021-10-24 NOTE — Transfer of Care (Signed)
Immediate Anesthesia Transfer of Care Note  Patient: [name]  Procedure(s) Performed: Procedure(s) (LRB): CYSTOSCOPY WITH RETROGRADE PYELOGRAM, URETEROSCOPY, POSSIBLE URETERAL AND RENAL BIOPSIES AND STENT PLACEMENT (Left) HOLMIUM LASER APPLICATION OF TUMORS (Left)  Patient Location: PACU  Anesthesia Type: General  Level of Consciousness: awake, oriented, sedated and patient cooperative  Airway & Oxygen Therapy: Patient Spontanous Breathing and Patient connected to face mask oxygen  Post-op Assessment: Report given to PACU RN and Post -op Vital signs reviewed and stable  Post vital signs: Reviewed and stable  Complications: No apparent anesthesia complications  Last Vitals:  Vitals Value Taken Time  BP 141/69 10/24/21 0902  Temp    Pulse 73 10/24/21 0903  Resp 13 10/24/21 0903  SpO2 96 % 10/24/21 0903  Vitals shown include unvalidated device data.  Last Pain:  Vitals:   10/24/21 0616  TempSrc: Oral  PainSc: 0-No pain         Complications: No notable events documented.

## 2021-10-24 NOTE — Anesthesia Preprocedure Evaluation (Signed)
Anesthesia Evaluation  Patient identified by MRN, date of birth, ID band Patient awake    Reviewed: Allergy & Precautions, NPO status , Patient's Chart, lab work & pertinent test results  History of Anesthesia Complications Negative for: history of anesthetic complications  Airway Mallampati: II  TM Distance: >3 FB Neck ROM: Full    Dental no notable dental hx. (+) Teeth Intact, Dental Advisory Given   Pulmonary neg shortness of breath, neg COPD, neg recent URI, former smoker,    Pulmonary exam normal breath sounds clear to auscultation       Cardiovascular hypertension, Pt. on medications and Pt. on home beta blockers + CAD and +CHF  Normal cardiovascular exam Rhythm:Regular Rate:Normal  02/16/21 1. Left ventricular ejection fraction, by estimation, is 70 to 75%. The  left ventricle has hyperdynamic function. The left ventricle has no  regional wall motion abnormalities. Left ventricular diastolic parameters  are consistent with Grade I diastolic  dysfunction (impaired relaxation).  2. Right ventricular systolic function is normal. The right ventricular  size is normal. There is normal pulmonary artery systolic pressure. The  estimated right ventricular systolic pressure is 01.0 mmHg.  3. The mitral valve is normal in structure. No evidence of mitral valve  regurgitation. No evidence of mitral stenosis.  4. The aortic valve is calcified. There is mild calcification of the  aortic valve. There is mild thickening of the aortic valve. Aortic valve  regurgitation is not visualized. Mild aortic valve sclerosis is present,  with no evidence of aortic valve  stenosis.  5. The inferior vena cava is normal in size with greater than 50%  respiratory variability, suggesting right atrial pressure of 3 mmHg.   Neuro/Psych PSYCHIATRIC DISORDERS Anxiety Depression negative neurological ROS     GI/Hepatic Neg liver ROS, GERD  ,   Endo/Other  negative endocrine ROS  Renal/GU Renal InsufficiencyRenal diseaseL ureteral CA  Lab Results      Component                Value               Date                      CREATININE               1.20 (H)            08/10/2021           Lab Results      Component                Value               Date                      K                        3.9                 08/10/2021                Musculoskeletal negative musculoskeletal ROS (+)   Abdominal (+) + obese (BMI 30.11),   Peds  Hematology   Anesthesia Other Findings All: Erythromycin, Lotensin  Reproductive/Obstetrics                             Anesthesia Physical Anesthesia  Plan  ASA: 3  Anesthesia Plan: General   Post-op Pain Management: Tylenol PO (pre-op) and Minimal or no pain anticipated   Induction: Intravenous  PONV Risk Score and Plan: 3 and Ondansetron, Dexamethasone and Treatment may vary due to age or medical condition  Airway Management Planned: LMA and Oral ETT  Additional Equipment: None  Intra-op Plan:   Post-operative Plan: Extubation in OR  Informed Consent: I have reviewed the patients History and Physical, chart, labs and discussed the procedure including the risks, benefits and alternatives for the proposed anesthesia with the patient or authorized representative who has indicated his/her understanding and acceptance.     Dental advisory given  Plan Discussed with: CRNA and Anesthesiologist  Anesthesia Plan Comments:         Anesthesia Quick Evaluation

## 2021-10-24 NOTE — Op Note (Signed)
Preoperative diagnosis: History of urothelial carcinoma of the left ureter and renal pelvis/collecting system  Postoperative diagnosis: Same  Principal procedure: Cystoscopy, left retrograde ureteropyelogram, fluoroscopic interpretation, left ureteroscopy, brush biopsy of left distal ureter, washings of left renal pelvis, holmium laser ablation of small left renal lesions, placement of 6 French by 24 cm contour double-J stent without tether  Surgeon: Tyson Masin  Anesthesia: General with LMA  Complications: None  Estimated blood loss: Less than 5 mL  Specimens: 1.  Brush biopsies of left distal ureter for cytology 2.  Left renal washings for cytology  Drains: None  Indications: 76 year old female with longstanding history of urothelial carcinoma.  She is status post right nephro ureterectomy and prior TURBT for high-grade right ureteral and bladder tumors.  Approximately a year and a half ago she was found to have left hydronephrosis and evaluation revealed a left distal ureteral urothelial tumor, urothelial carcinoma.  She underwent laser ablation of that.  Follow-up revealed minimal if any recurrence in the ureter, but she has been found to have small lesions in the left pyelocalyceal system that were treated initially with laser ablation.  She underwent eventual left percutaneous nephrostomy tube placement and underwent 6 weekly treatments of Jelmyto.  That was completed approximately 2 months ago.  She presents at this time for cystoscopy, left retrograde, ureteroscopy, possible ureteral laser management of any persistent urothelial carcinoma.  I have discussed with Jenny Reichmann the procedure as well as expected outcomes.  She understands and desires to proceed.  Findings: Urothelium of the bladder was normal.  Right ureteral orifice was absent, left was normal.  Retrograde study of the left ureter and pyelocalyceal system revealed minimal narrowing of the mid/distal left ureter but no evident  hydronephrosis.  There was free flow of contrast all the way up into the pyelocalyceal system.  There were no filling defects noted within the ureter or the pyelocalyceal system.  Ureteroscopically there was minimal stenosis of the left ureter approximately 8 cm up.  However, there was no urothelial lesion noted.  Pyelocalyceal system revealed no obvious papillary lesions.  There were 2-3 small very white nodular lesions that appeared quite regular, and it was my feeling these more than likely were not neoplasms.  Description of procedure: The patient was properly identified and marked.  She was taken to the operating room where general anesthetic was administered with the LMA.  She was placed in the dorsolithotomy position.  Genitalia and perineum were prepped, draped, proper timeout performed.  21 French panendoscope advanced into the bladder.  Bladder inspected systematically and found to be normal with the above-mentioned findings.  6 [place] open-ended catheter was utilized for retrograde ureteropyelogram.  Above-mentioned findings were noted.  No suspicious areas present/no filling defect noted.  Following this, sensor tip guidewire advanced through the open-ended catheter, cystoscope and catheter were removed.  The 6 French dual-lumen semirigid ureteroscope was easily advanced up into the left ureter.  The minimally narrowed area was brushed biopsied.  This was sent for cytology labeled "left ureteral brush biopsies".  Also no specific urothelial lesions.  I then negotiated the scope all the way up to the renal pelvis.  No urothelial lesions were seen in the remaining ureter.  I then remove the semirigid scope.  I sequentially dilated the ureter first with the obturator and then the entire 12/14 ureteral access catheter was placed.  I then passed the flexible ureteroscope.  The entire pyelocalyceal system was systematically evaluated.  2-3 small yellowish nodules approximately 2 cm  in size were identified  in the upper pole infundibular area.  No other urothelial lesions were noted.  Washings were taken through the scope with saline of the left pyelocalyceal system.  Sent for cytology labeled "left renal washings".  At this point I did use a 200 m fiber to ablate the small lesions discussed above.  At this point, the pyelocalyceal system was again inspected systematically.  No further lesions were seen in the scope was removed.  The guidewire was passed through the access catheter with the access catheter then being removed.  Guidewire backloaded through the scope, and I passed a 24 cm x 6 French contour double-J stent with a tether removed.  Once adequate positioning was seen, it was deployed with the guidewire be removed.  Excellent proximal and distal curls were seen.  The bladder was drained and the scope removed.  At this point, the procedure was terminated.  The patient was awakened and taken to the PACU in stable condition, having tolerated the procedure well.

## 2021-10-25 LAB — CYTOLOGY - NON PAP

## 2021-10-26 ENCOUNTER — Encounter (HOSPITAL_BASED_OUTPATIENT_CLINIC_OR_DEPARTMENT_OTHER): Payer: Self-pay | Admitting: Urology

## 2021-11-20 ENCOUNTER — Other Ambulatory Visit: Payer: Self-pay | Admitting: Cardiovascular Disease

## 2021-11-22 ENCOUNTER — Other Ambulatory Visit: Payer: Self-pay

## 2021-11-22 MED ORDER — CARVEDILOL 6.25 MG PO TABS: ORAL_TABLET | ORAL | 2 refills | Status: DC

## 2021-11-28 ENCOUNTER — Other Ambulatory Visit: Payer: Self-pay

## 2021-11-28 ENCOUNTER — Other Ambulatory Visit: Payer: Medicare Other | Admitting: *Deleted

## 2021-11-28 DIAGNOSIS — I2510 Atherosclerotic heart disease of native coronary artery without angina pectoris: Secondary | ICD-10-CM | POA: Diagnosis not present

## 2021-11-28 DIAGNOSIS — E782 Mixed hyperlipidemia: Secondary | ICD-10-CM

## 2021-11-28 LAB — LIPID PANEL
Chol/HDL Ratio: 2.9 ratio (ref 0.0–4.4)
Cholesterol, Total: 162 mg/dL (ref 100–199)
HDL: 56 mg/dL (ref >39)
LDL Chol Calc (NIH): 82 mg/dL (ref 0–99)
Triglycerides: 140 mg/dL (ref 0–149)
VLDL Cholesterol Cal: 24 mg/dL (ref 5–40)

## 2021-11-28 LAB — ALT: ALT: 13 IU/L (ref 0–32)

## 2021-12-02 ENCOUNTER — Telehealth: Payer: Self-pay | Admitting: Internal Medicine

## 2021-12-02 DIAGNOSIS — E782 Mixed hyperlipidemia: Secondary | ICD-10-CM

## 2021-12-02 DIAGNOSIS — I2510 Atherosclerotic heart disease of native coronary artery without angina pectoris: Secondary | ICD-10-CM

## 2021-12-02 MED ORDER — ROSUVASTATIN CALCIUM 40 MG PO TABS: 40.0000 mg | ORAL_TABLET | Freq: Every day | ORAL | 3 refills | Status: DC

## 2021-12-02 NOTE — Telephone Encounter (Signed)
Patient is returning call regarding lab results

## 2021-12-02 NOTE — Telephone Encounter (Signed)
Patient returning call.

## 2021-12-02 NOTE — Telephone Encounter (Signed)
-----   Message from Werner Lean, MD sent at 11/28/2021  6:08 PM EST ----- No change in safety labs with no change in cholesterol If patient did increase her statin we can try further increase and lab check in three month; labs could be consistent with her no changing dose.   ----- Message ----- From: Interface, [facility] Lab Results In Sent: 11/28/2021   5:36 PM EST To: Werner Lean, MD

## 2021-12-02 NOTE — Telephone Encounter (Signed)
Left a message to call back.

## 2021-12-02 NOTE — Telephone Encounter (Signed)
The patient has been notified of the result and verbalized understanding.  All questions (if any) were answered. [place], RN 12/02/2021 4:27 PM   Pt reports taking rosuvastatin 20 mg PO QD consistently will increase to 40 mg PO QD.  F/U labs to be drawn on 02/20/22.  All questions answered.

## 2021-12-07 DIAGNOSIS — C652 Malignant neoplasm of left renal pelvis: Secondary | ICD-10-CM | POA: Diagnosis not present

## 2021-12-07 DIAGNOSIS — C662 Malignant neoplasm of left ureter: Secondary | ICD-10-CM | POA: Diagnosis not present

## 2021-12-07 DIAGNOSIS — N3000 Acute cystitis without hematuria: Secondary | ICD-10-CM | POA: Diagnosis not present

## 2021-12-07 DIAGNOSIS — Z8551 Personal history of malignant neoplasm of bladder: Secondary | ICD-10-CM | POA: Diagnosis not present

## 2021-12-09 DIAGNOSIS — I10 Essential (primary) hypertension: Secondary | ICD-10-CM | POA: Diagnosis not present

## 2021-12-09 DIAGNOSIS — E7800 Pure hypercholesterolemia, unspecified: Secondary | ICD-10-CM | POA: Diagnosis not present

## 2021-12-09 DIAGNOSIS — C689 Malignant neoplasm of urinary organ, unspecified: Secondary | ICD-10-CM | POA: Diagnosis not present

## 2021-12-09 DIAGNOSIS — G4700 Insomnia, unspecified: Secondary | ICD-10-CM | POA: Diagnosis not present

## 2021-12-09 DIAGNOSIS — R6889 Other general symptoms and signs: Secondary | ICD-10-CM | POA: Diagnosis not present

## 2021-12-13 ENCOUNTER — Telehealth: Payer: Self-pay | Admitting: Internal Medicine

## 2021-12-13 MED ORDER — ISOSORBIDE MONONITRATE ER 30 MG PO TB24: 15.0000 mg | ORAL_TABLET | Freq: Every day | ORAL | 3 refills | Status: DC

## 2021-12-13 NOTE — Telephone Encounter (Signed)
[name], CMA from PCP office called back.  Reports that Dr. Lindell [name] is concerned that pt may have anginal equivalent and needs earlier follow up.  PCP office has not prescribed NTG.  Called pt who reports increased SOB and fatigue.  Symptoms increased around 2 weeks ago.  When asked if symptoms keep pt from doing normal activities pt states "not really".  Pt not very active does not exercise on a regular basis.  Pt denies CP, last BP reading from last Friday 12/09/21 112/?.  Scheduled OV for 12/21/21.  Will route to MD to advise if NTG is needed per PCP concern.   ?

## 2021-12-13 NOTE — Telephone Encounter (Signed)
RN calling stating pt came in last friday for med check. Pt said she had SOB when moving trash cans around and is more tired than base line making the bed or sweeping the kitchen. She has also noticed sounds like decreased exercise tolerance Dr. Lindell Noe is wondering if this is anginal equivalent and if a trial nitro should be used or if Dr. Lyda Kalata wants to move current scheduled appt up to evaluate himself. RN is requesting callback from RN.  ?

## 2021-12-13 NOTE — Telephone Encounter (Signed)
Left a message for Selinda Flavin, CMA from PCP office to call back.  ?

## 2021-12-13 NOTE — Telephone Encounter (Signed)
Called pt notified of MD recommendations.  Pt is agreeable to start imdur 15 mg PO QD.  Order placed and sent to pharmacy of pt choice.  ?

## 2021-12-20 NOTE — Progress Notes (Signed)
?Cardiology Office Note:   ? ?Date:  12/21/2021  ? ?ID:  [name], DOB 17-Jan-1946, MRN [id number] ? ?PCP:  [name], MD ?  ?[place]  ?Cardiologist:  Werner Lean, MD  ?Advanced Practice Provider:  No care team member to display ?Electrophysiologist:  None  ?    ?CC:HF, CAD f/u ? ?History of Present Illness:   ? ?[name] is a 76 y.o. female with a hx of moderate nonobstructive CAD (70% Diag disease 11/05/20), HFmrEF, HTN and HLD, who presents for evaluation 01/11/21.  Had ureteral carcinoma and see urology.  In interim had LVEF recovery.  In interim of last visit increased statin for prevention. ? ?Patient notes that she is doing well after surgery.   ?She is going to need chemo and is awaiting surgery. ? ?No cardiac complications.  ? ?No chest pain or pressure .  No SOB; rare DOE after a shower.  No PND/Orthopnea.  No weight gain or leg swelling.  No palpitations or syncope .Does feel tired but there is no really change with blood pressure.  ? ? ?Past Medical History:  ?Diagnosis Date  ? Anxiety   ? CAD in native artery   ? a. cath 10/2019- mild to moderate dx >> medical therapy   ? Cancer [facility])   ? left ureteral cancer dx sept 2020  ? CHF (congestive heart failure) ([place])   ? Chronic kidney disease 06/2019  ? acute renal insufficiency sees dr Dorina Hoyer, ckd stage 3 per pt  ? COVID 09/2020  ? tired sore throat nausea hair fell out loss of taste and smell x 10 days, all symptoms resolved except still has loss of taste and smell  ? Depression   ? GERD (gastroesophageal reflux disease)   ? Hyperlipidemia   ? Hypertension   ? ? ?Past Surgical History:  ?Procedure Laterality Date  ? ABDOMINAL HYSTERECTOMY    ? partial  ? bladder-partial removal    ? CYSTOSCOPY W/ URETERAL STENT PLACEMENT Left 11/17/2019  ? Procedure: CYSTOSCOPY WITH STENT REPLACEMENT;  Surgeon: Franchot Gallo, MD;  Location: [facility];  Service: Urology;  Laterality: Left;   ? CYSTOSCOPY W/ URETERAL STENT REMOVAL Left 05/10/2020  ? Procedure: CYSTOSCOPY WITH STENT REMOVAL;  Surgeon: Franchot Gallo, MD;  Location: [facility];  Service: Urology;  Laterality: Left;  ? CYSTOSCOPY W/ URETERAL STENT REMOVAL Left 05/02/2021  ? Procedure: CYSTOSCOPY WITH STENT REMOVAL;  Surgeon: Franchot Gallo, MD;  Location: [facility];  Service: Urology;  Laterality: Left;  ? CYSTOSCOPY WITH RETROGRADE PYELOGRAM, URETEROSCOPY AND STENT PLACEMENT Left 06/27/2019  ? Procedure: CYSTOSCOPY WITH LEFT RETROGRADE PYELOGRAM, URETEROSCOPY, BIOPSY AND LEFT STENT PLACEMENT;  Surgeon: Irine Seal, MD;  Location: [facility];  Service: Urology;  Laterality: Left;  ? CYSTOSCOPY WITH RETROGRADE PYELOGRAM, URETEROSCOPY AND STENT PLACEMENT Left 08/18/2019  ? Procedure: CYSTOSCOPY, URETEROSCOPY AND STENT EXCHANGE;  Surgeon: Franchot Gallo, MD;  Location: [facility];  Service: Urology;  Laterality: Left;  90 MINS  ? CYSTOSCOPY WITH RETROGRADE PYELOGRAM, URETEROSCOPY AND STENT PLACEMENT Left 11/17/2019  ? Procedure: CYSTOSCOPY WITH RETROGRADE PYELOGRAM, URETEROSCOPY AND STENT PLACEMENT;  Surgeon: Franchot Gallo, MD;  Location: [facility];  Service: Urology;  Laterality: Left;  90 MINS  ? CYSTOSCOPY WITH RETROGRADE PYELOGRAM, URETEROSCOPY AND STENT PLACEMENT Left 05/10/2020  ? Procedure: CYSTOSCOPY WITH RETROGRADE PYELOGRAM, URETEROSCOPY AND STENT PLACEMENT WITH URETHRAL DIALATION AND BRUSH BIOPSY;  Surgeon: Franchot Gallo, MD;  Location: [facility];  Service: Urology;  Laterality: Left;  1 HR  ? CYSTOSCOPY WITH RETROGRADE PYELOGRAM, URETEROSCOPY AND STENT PLACEMENT Left 09/16/2020  ? Procedure: CYSTOSCOPY WITH RETROGRADE PYELOGRAM, URETEROSCOPY ,  LITHOPAXY, LEFT URETERAL BRUSHING, AND STENT REPLACEMENT;  Surgeon: Franchot Gallo, MD;  Location: [facility];  Service: Urology;  Laterality: Left;  ? CYSTOSCOPY WITH RETROGRADE PYELOGRAM,  URETEROSCOPY AND STENT PLACEMENT Left 10/24/2021  ? Procedure: CYSTOSCOPY WITH RETROGRADE PYELOGRAM, URETEROSCOPY, POSSIBLE URETERAL AND RENAL BIOPSIES AND STENT PLACEMENT;  Surgeon: Franchot Gallo, MD;  Location: [facility];  Service: Urology;  Laterality: Left;  1 HR  ? CYSTOSCOPY/URETEROSCOPY/HOLMIUM LASER/STENT PLACEMENT Left 05/02/2021  ? Procedure: CYSTOSCOPY/URETEROSCOPY WITH BRUSH BIOPSY/ RETROGRADE PYELOGRAM/ HOLMIUM LASER/STENT REPLACEMENT;  Surgeon: Franchot Gallo, MD;  Location: [facility];  Service: Urology;  Laterality: Left;  ? HOLMIUM LASER APPLICATION Left 41/04/4080  ? Procedure: HOLMIUM LASER APPLICATION;  Surgeon: Franchot Gallo, MD;  Location: [facility];  Service: Urology;  Laterality: Left;  ? HOLMIUM LASER APPLICATION Left 4/48/1856  ? Procedure: HOLMIUM LASER APPLICATION OF TUMORS;  Surgeon: Franchot Gallo, MD;  Location: [facility];  Service: Urology;  Laterality: Left;  ? IR NEPHROSTOMY PLACEMENT LEFT  07/06/2021  ? kidney removed  2006  ? right   ? RIGHT/LEFT HEART CATH AND CORONARY ANGIOGRAPHY N/A 11/06/2019  ? Procedure: RIGHT/LEFT HEART CATH AND CORONARY ANGIOGRAPHY;  Surgeon: Belva Crome, MD;  Location: [facility];  Service: Cardiovascular;  Laterality: N/A;  ? THULIUM LASER TURP (TRANSURETHRAL RESECTION OF PROSTATE) Left 08/18/2019  ? Procedure: THULIUM LASER ABLATION OF URETERAL TUMOR;  Surgeon: Franchot Gallo, MD;  Location: [facility];  Service: Urology;  Laterality: Left;  ? THULIUM LASER TURP (TRANSURETHRAL RESECTION OF PROSTATE) Left 11/17/2019  ? Procedure: THULIUM LASER of URETERAL CANCER;  Surgeon: Franchot Gallo, MD;  Location: [facility];  Service: Urology;  Laterality: Left;  ? ? ?Current Medications: ?Current Meds  ?Medication Sig  ? acetaminophen (TYLENOL) 500 MG tablet Take 500 mg by mouth every 6 (six) hours as needed for moderate pain.  ? aspirin EC 81 MG tablet  Take 1 tablet (81 mg total) by mouth daily.  ? calcium carbonate (TUMS - DOSED IN MG ELEMENTAL CALCIUM) 500 MG chewable tablet Chew 1 tablet by mouth every morning.  ? carvedilol (COREG) 6.25 MG tablet TAKE 1 TABLET(6.25 MG) BY MOUTH TWICE DAILY WITH A MEAL  ? cholecalciferol (VITAMIN D3) 25 MCG (1000 UNIT) tablet Take 1,000 Units by mouth daily.  ? ezetimibe (ZETIA) 10 MG tablet Take 1 tablet (10 mg total) by mouth daily.  ? hydrALAZINE (APRESOLINE) 50 MG tablet TAKE 1 TABLET(50 MG) BY MOUTH EVERY 8 HOURS  ? isosorbide mononitrate (IMDUR) 30 MG 24 hr tablet Take 0.5 tablets (15 mg total) by mouth daily.  ? oxybutynin (DITROPAN) 5 MG tablet Take 5 mg by mouth as needed.  ? rosuvastatin (CRESTOR) 40 MG tablet Take 1 tablet (40 mg total) by mouth daily.  ? sertraline (ZOLOFT) 50 MG tablet Take 50 mg by mouth at bedtime.  ? traZODone (DESYREL) 150 MG tablet Take 150 mg by mouth at bedtime.  ?  ? ?Allergies:   Erythromycin and Lotensin [benazepril hcl]  ? ?Social History  ? ?Socioeconomic History  ? Marital status: Married  ?  Spouse name: Not on file  ? Number of children: Not on file  ? Years of education: Not on file  ? Highest education level: Not on file  ?  Occupational History  ? Not on file  ?Tobacco Use  ? Smoking status: Former  ?  Packs/day: 0.50  ?  Years: 38.00  ?  Pack years: 19.00  ?  Types: Cigarettes  ?  Quit date: 11/26/2003  ?  Years since quitting: 18.0  ? Smokeless tobacco: Never  ?Vaping Use  ? Vaping Use: Never used  ?Substance and Sexual Activity  ? Alcohol use: No  ?  Alcohol/week: 0.0 standard drinks  ? Drug use: No  ? Sexual activity: Yes  ?  Birth control/protection: Post-menopausal  ?Other Topics Concern  ? Not on file  ?Social History Narrative  ? Not on file  ? ?Social Determinants of Health  ? ?Financial Resource Strain: Not on file  ?Food Insecurity: Not on file  ?Transportation Needs: Not on file  ?Physical Activity: Not on file  ?Stress: Not on file  ?Social Connections: Not on file  ?   ? ?Family History: ?The patient's family history includes Diabetes Mellitus II in her sister; Hypertension in her brother, mother, and sister. ?History of coronary artery disease notable for no members. ?History of

## 2021-12-21 ENCOUNTER — Ambulatory Visit: Payer: Medicare PPO | Admitting: Internal Medicine

## 2021-12-21 ENCOUNTER — Other Ambulatory Visit: Payer: Self-pay

## 2021-12-21 ENCOUNTER — Encounter: Payer: Self-pay | Admitting: Internal Medicine

## 2021-12-21 VITALS — BP 120/70 | HR 83 | Ht 63.0 in | Wt 180.0 lb

## 2021-12-21 DIAGNOSIS — I2510 Atherosclerotic heart disease of native coronary artery without angina pectoris: Secondary | ICD-10-CM | POA: Diagnosis not present

## 2021-12-21 DIAGNOSIS — I10 Essential (primary) hypertension: Secondary | ICD-10-CM

## 2021-12-21 DIAGNOSIS — E782 Mixed hyperlipidemia: Secondary | ICD-10-CM | POA: Diagnosis not present

## 2021-12-21 MED ORDER — EZETIMIBE 10 MG PO TABS: 10.0000 mg | ORAL_TABLET | Freq: Every day | ORAL | 3 refills | Status: DC

## 2021-12-21 NOTE — Patient Instructions (Signed)
Medication Instructions:  ?Your physician has recommended you make the following change in your medication:  ? ?START: ezetimibe (Zetia) 10 mg by mouth once daily ?  ?*If you need a refill on your cardiac medications before your next appointment, please call your pharmacy* ? ? ?Lab Work: ?NONE ?If you have labs (blood work) drawn today and your tests are completely normal, you will receive your results only by: ?MyChart Message (if you have MyChart) OR ?A paper copy in the mail ?If you have any lab test that is abnormal or we need to change your treatment, we will call you to review the results. ? ? ?Testing/Procedures: ?NONE ? ? ?Follow-Up: ?At [facility], you and your health needs are our priority.  As part of our continuing mission to provide you with exceptional heart care, we have created designated Provider Care Teams.  These Care Teams include your primary Cardiologist (physician) and Advanced Practice Providers (APPs -  Physician Assistants and Nurse Practitioners) who all work together to provide you with the care you need, when you need it. ? ?Your next appointment:   ?1 year(s) ? ?The format for your next appointment:   ?In Person ? ?Provider:   ?Werner Lean, MD   ? ?

## 2022-01-29 ENCOUNTER — Other Ambulatory Visit: Payer: Self-pay | Admitting: Internal Medicine

## 2022-01-31 ENCOUNTER — Other Ambulatory Visit (HOSPITAL_COMMUNITY): Payer: Self-pay | Admitting: Urology

## 2022-01-31 DIAGNOSIS — C652 Malignant neoplasm of left renal pelvis: Secondary | ICD-10-CM

## 2022-02-03 ENCOUNTER — Other Ambulatory Visit: Payer: Self-pay | Admitting: Radiology

## 2022-02-06 ENCOUNTER — Ambulatory Visit (HOSPITAL_COMMUNITY)
Admission: RE | Admit: 2022-02-06 | Discharge: 2022-02-06 | Disposition: A | Discharge status: Home / Self Care | Payer: Medicare PPO | Source: Ambulatory Visit | Attending: Urology | Admitting: Urology

## 2022-02-06 ENCOUNTER — Other Ambulatory Visit: Payer: Self-pay

## 2022-02-06 ENCOUNTER — Encounter (HOSPITAL_COMMUNITY): Payer: Self-pay

## 2022-02-06 DIAGNOSIS — N189 Chronic kidney disease, unspecified: Secondary | ICD-10-CM | POA: Diagnosis not present

## 2022-02-06 DIAGNOSIS — I130 Hypertensive heart and chronic kidney disease with heart failure and stage 1 through stage 4 chronic kidney disease, or unspecified chronic kidney disease: Secondary | ICD-10-CM | POA: Insufficient documentation

## 2022-02-06 DIAGNOSIS — C679 Malignant neoplasm of bladder, unspecified: Secondary | ICD-10-CM | POA: Insufficient documentation

## 2022-02-06 DIAGNOSIS — F32A Depression, unspecified: Secondary | ICD-10-CM | POA: Diagnosis not present

## 2022-02-06 DIAGNOSIS — I509 Heart failure, unspecified: Secondary | ICD-10-CM | POA: Insufficient documentation

## 2022-02-06 DIAGNOSIS — I2510 Atherosclerotic heart disease of native coronary artery without angina pectoris: Secondary | ICD-10-CM | POA: Insufficient documentation

## 2022-02-06 DIAGNOSIS — C652 Malignant neoplasm of left renal pelvis: Secondary | ICD-10-CM

## 2022-02-06 DIAGNOSIS — K219 Gastro-esophageal reflux disease without esophagitis: Secondary | ICD-10-CM | POA: Insufficient documentation

## 2022-02-06 DIAGNOSIS — C688 Malignant neoplasm of overlapping sites of urinary organs: Secondary | ICD-10-CM | POA: Diagnosis not present

## 2022-02-06 DIAGNOSIS — E785 Hyperlipidemia, unspecified: Secondary | ICD-10-CM | POA: Diagnosis not present

## 2022-02-06 DIAGNOSIS — F419 Anxiety disorder, unspecified: Secondary | ICD-10-CM | POA: Insufficient documentation

## 2022-02-06 HISTORY — PX: IR NEPHROSTOMY PLACEMENT LEFT: IMG6063

## 2022-02-06 LAB — BASIC METABOLIC PANEL
Anion gap: 7 (ref 5–15)
BUN: 18 mg/dL (ref 8–23)
CO2: 28 mmol/L (ref 22–32)
Calcium: 8.7 mg/dL — ABNORMAL LOW (ref 8.9–10.3)
Chloride: 102 mmol/L (ref 98–111)
Creatinine, Ser: 1.44 mg/dL — ABNORMAL HIGH (ref 0.44–1.00)
GFR, Estimated: 38 mL/min — ABNORMAL LOW (ref >60)
Glucose, Bld: 114 mg/dL — ABNORMAL HIGH (ref 70–99)
Potassium: 3.7 mmol/L (ref 3.5–5.1)
Sodium: 137 mmol/L (ref 135–145)

## 2022-02-06 LAB — CBC WITH DIFFERENTIAL/PLATELET
Abs Immature Granulocytes: 0.02 10*3/uL (ref 0.00–0.07)
Basophils Absolute: 0 10*3/uL (ref 0.0–0.1)
Basophils Relative: 1 %
Eosinophils Absolute: 0.2 10*3/uL (ref 0.0–0.5)
Eosinophils Relative: 3 %
HCT: 38.8 % (ref 36.0–46.0)
Hemoglobin: 12.8 g/dL (ref 12.0–15.0)
Immature Granulocytes: 0 %
Lymphocytes Relative: 28 %
Lymphs Abs: 1.8 10*3/uL (ref 0.7–4.0)
MCH: 30.5 pg (ref 26.0–34.0)
MCHC: 33 g/dL (ref 30.0–36.0)
MCV: 92.6 fL (ref 80.0–100.0)
Monocytes Absolute: 0.6 10*3/uL (ref 0.1–1.0)
Monocytes Relative: 10 %
Neutro Abs: 4 10*3/uL (ref 1.7–7.7)
Neutrophils Relative %: 58 %
Platelets: 219 10*3/uL (ref 150–400)
RBC: 4.19 MIL/uL (ref 3.87–5.11)
RDW: 13.9 % (ref 11.5–15.5)
WBC: 6.7 10*3/uL (ref 4.0–10.5)
nRBC: 0 % (ref 0.0–0.2)

## 2022-02-06 LAB — PROTIME-INR
INR: 1.1 (ref 0.8–1.2)
Prothrombin Time: 13.9 seconds (ref 11.4–15.2)

## 2022-02-06 MED ORDER — SODIUM CHLORIDE 0.9 % IV SOLN: INTRAVENOUS | Status: DC

## 2022-02-06 MED ORDER — SODIUM CHLORIDE 0.9% FLUSH
5.0000 mL | Freq: Three times a day (TID) | INTRAVENOUS | Status: DC
  Administered 2022-02-06: 5 mL

## 2022-02-06 MED ORDER — FENTANYL CITRATE (PF) 100 MCG/2ML IJ SOLN
INTRAMUSCULAR | Status: AC
  Filled 2022-02-06: qty 2

## 2022-02-06 MED ORDER — SODIUM CHLORIDE 0.9 % IV SOLN
2.0000 g | INTRAVENOUS | Status: AC
  Administered 2022-02-06: 2 g via INTRAVENOUS
  Filled 2022-02-06 (×2): qty 20

## 2022-02-06 MED ORDER — LIDOCAINE HCL 1 % IJ SOLN
INTRAMUSCULAR | Status: AC
  Filled 2022-02-06: qty 20

## 2022-02-06 MED ORDER — MIDAZOLAM HCL 2 MG/2ML IJ SOLN
INTRAMUSCULAR | Status: AC
  Filled 2022-02-06: qty 4

## 2022-02-06 MED ORDER — OXYCODONE HCL 5 MG PO TABS
ORAL_TABLET | ORAL | Status: AC
  Administered 2022-02-06: 5 mg
  Filled 2022-02-06: qty 1

## 2022-02-06 MED ORDER — IOHEXOL 300 MG/ML  SOLN
50.0000 mL | Freq: Once | INTRAMUSCULAR | Status: AC | PRN
  Administered 2022-02-06: 15 mL

## 2022-02-06 MED ORDER — OXYCODONE HCL 5 MG PO TABS: 5.0000 mg | ORAL_TABLET | Freq: Four times a day (QID) | ORAL | 0 refills | Status: DC | PRN

## 2022-02-06 NOTE — Discharge Instructions (Addendum)
For questions /concerns may call Interventional Radiology at [phone] ? ?You may remove your dressing and shower tomorrow afternoon ? ?Percutaneous Nephrostomy, Care After ?This sheet gives you information about how to care for yourself after your procedure. Your health care provider may also give you more specific instructions. If you have problems or questions, contact your health care provider. ?What can I expect after the procedure? ?After the procedure, it is common to have: ?Some soreness where the nephrostomy tube was inserted (tube insertion site). ?Blood-tinged drainage from the nephrostomy tube for the first 24 hours. ?Follow these instructions at home: ?Activity ? ?Do not lift anything that is heavier than 10 lb (4.5 kg), or the limit that you are told, until your health care provider says that it is safe. ?Return to your normal activities as told by your health care provider. Ask your health care provider what activities are safe for you. ?Avoid activities that may cause the nephrostomy tubing to bend. ?Do not take baths, swim, or use a hot tub until your health care provider approves. Ask your health care provider if you can take showers. Cover the nephrostomy tube bandage (dressing) with a watertight covering when you take a shower. ?If you were given a sedative during the procedure, it can affect you for several hours. Do not drive or operate machinery until your health care provider says that it is safe. ?Care of the tube insertion site ? ?Follow instructions from your health care provider about how to take care of your tube insertion site. Make sure you: ?Wash your hands with soap and water for at least 20 seconds before you change your dressing. If soap and water are not available, use hand sanitizer. ?Change your dressing as told by your health care provider. Be careful not to pull on the tube while removing the dressing. ?When you change the dressing, wash the skin around the tube, rinse  well, and pat the skin dry. ?Check the tube insertion area every day for signs of infection. Check for: ?Redness, swelling, or pain. ?Fluid or blood. ?Warmth. ?Pus or a bad smell. ?Care of the nephrostomy tube and drainage bag ?Always keep the tubing, the leg bag, or the bedside drainage bags below the level of the kidney so that your urine drains freely. ?When connecting your nephrostomy tube to a drainage bag, make sure that there are no kinks in the tubing and that your urine is draining freely. You may want to use an elastic bandage to wrap any exposed tubing that goes from the nephrostomy tube to any of the connecting tubes. ?At night, you may want to connect your nephrostomy tube or the leg bag to a larger bedside drainage bag. ?Follow instructions from your health care provider about how to empty or change the drainage bag. ?Empty the drainage bag when it becomes ? full. ?Replace the drainage bag and any extension tubing that is connected to your nephrostomy tube every 7 days or as told by your health care provider. Your health care provider will explain how to change the drainage bag and extension tubing. ?General instructions ?Take over-the-counter and prescription medicines only as told by your health care provider. ?Keep all follow-up visits as told by your health care provider. This is important. ?The nephrostomy tube will need to be changed every 8-12 weeks. ?Contact a health care provider if: ?You have problems with any of the valves or tubing. ?You have persistent pain or soreness in your back. ?You have redness, swelling, or pain  around your tube insertion site. ?You have fluid or blood coming from your tube insertion site. ?Your tube insertion site feels warm to the touch. ?You have pus or a bad smell coming from your tube insertion site. ?You have increased urine output or you feel burning when urinating. ?Get help right away if: ?You have pain in your abdomen during the first week. ?You have chest  pain or have trouble breathing. ?You have a new appearance of blood in your urine. ?You have a fever or chills. ?You have back pain that is not relieved by your medicine. ?You have decreased urine output. ?Your nephrostomy tube comes out. ?Summary ?After the procedure, it is common to have some soreness where the nephrostomy tube was inserted (tube insertion site). ?Follow instructions from your health care provider about how to take care of your tube insertion site, nephrostomy tube, and drainage bag. ?Keep all follow-up visits for care and for changing the tube. ?This information is not intended to replace advice given to you by your health care provider. Make sure you discuss any questions you have with your health care provider. ?Document Revised: 10/21/2019 Document Reviewed: 10/21/2019 ?Elsevier Patient Education ? [place]. ? ? ? Moderate Conscious Sedation, Adult, Care After ?This sheet gives you information about how to care for yourself after your procedure. Your health care provider may also give you more specific instructions. If you have problems or questions, contact your health careprovider. ?What can I expect after the procedure? ?After the procedure, it is common to have: ?Sleepiness for several hours. ?Impaired judgment for several hours. ?Difficulty with balance. ?Vomiting if you eat too soon. ?Follow these instructions at home: ?For the time period you were told by your health care provider: ?Rest. ?Do not participate in activities where you could fall or become injured. ?Do not drive or use machinery. ?Do not drink alcohol. ?Do not take sleeping pills or medicines that cause drowsiness. ?Do not make important decisions or sign legal documents. ?Do not take care of children on your own. ?Eating and drinking ? ?Follow the diet recommended by your health care provider. ?Drink enough fluid to keep your urine pale yellow. ?If you vomit: ?Drink water, juice, or soup when you can drink without  vomiting. ?Make sure you have little or no nausea before eating solid foods. ? ?General instructions ?Take over-the-counter and prescription medicines only as told by your health care provider. ?Have a responsible adult stay with you for the time you are told. It is important to have someone help care for you until you are awake and alert. ?Do not smoke. ?Keep all follow-up visits as told by your health care provider. This is important. ?Contact a health care provider if: ?You are still sleepy or having trouble with balance after 24 hours. ?You feel light-headed. ?You keep feeling nauseous or you keep vomiting. ?You develop a rash. ?You have a fever. ?You have redness or swelling around the IV site. ?Get help right away if: ?You have trouble breathing. ?You have new-onset confusion at home. ?Summary ?After the procedure, it is common to feel sleepy, have impaired judgment, or feel nauseous if you eat too soon. ?Rest after you get home. Know the things you should not do after the procedure. ?Follow the diet recommended by your health care provider and drink enough fluid to keep your urine pale yellow. ?Get help right away if you have trouble breathing or new-onset confusion at home. ?This information is not intended to replace advice given  to you by your health care provider. Make sure you discuss any questions you have with your healthcare provider. ?Document Revised: 01/23/2020 Document Reviewed: 08/21/2019 ?Elsevier Patient Education ? [place].  ?

## 2022-02-06 NOTE — Procedures (Signed)
Interventional Radiology Procedure Note ? ?Procedure: [place] AND FLUORO LEFT PCN   ? ?Complications: None ? ?Estimated Blood Loss:  MIN ? ?Findings: ?10FR PCN   ? ?M. Daryll Brod, MD ? ? ? ?

## 2022-02-06 NOTE — Progress Notes (Signed)
1528 pt. Returned from IR: report of slight decrease in O2 and BP.  Pain stated 8/10 "soreness" .  Provided positioning and comfort measures. ?1600 continued with pain of 8/10 .  Called Dr. Annamaria Boots and PA K. Allred. 1630 discussed with PA and Dr. Reesa Chew of continued pain. BP in 90/s.  IV increased ?1645 Orders received for bolus and pain med. ?

## 2022-02-06 NOTE — H&P (Signed)
? ? ?Referring Physician(s): ?Dahlstedt,Stephen ? ?Supervising Physician: Daryll Brod ? ?Patient Status:  [facility] ? ?Chief Complaint: ?Urothelial carcinoma ? ? ?Subjective: ?Patient known to IR service from right nephroureteral catheter placement in 2008 and left percutaneous nephrostomy on 07/06/2021.  She has a longstanding history of urothelial carcinoma and is status post right nephro ureterectomy and prior TURBT for high-grade right ureteral and bladder tumors.  Approximate a year and a half ago she was found to have left hydronephrosis and evaluation revealed a left distal ureteral urothelial tumor, urothelial carcinoma.  She underwent laser ablation of that. Follow-up revealed minimal if any recurrence in the ureter, but she was found to have small lesions in the left pyelocalyceal system that were treated initially with laser ablation.  She underwent eventual left percutaneous nephrostomy tube placement and underwent 6 weekly treatments of Jelmyto which was completed late last year.  She underwent cystoscopy, left retrograde ureteropyelogram, left ureteroscopy, brush biopsy of distal left ureter, washings of left renal pelvis, holmium laser ablation of small left renal lesions and placement of left double-J stent on 10/24/2021.  Pathology was suspicious for high-grade urothelial carcinoma.  She presents again today for left percutaneous nephrostomy to assist with additional Jelmyto (mitomycin) treatments.  She denies fever, headache, chest pain, cough, dyspnea, abdominal pain, nausea, vomiting or bleeding.  She does have back pain.  Additional medical history as below. ? ?Past Medical History:  ?Diagnosis Date  ? Anxiety   ? CAD in native artery   ? a. cath 10/2019- mild to moderate dx >> medical therapy   ? Cancer [facility])   ? left ureteral cancer dx sept 2020  ? CHF (congestive heart failure) ([place])   ? Chronic kidney disease 06/2019  ? acute renal insufficiency sees dr Dorina Hoyer, ckd stage 3 per pt  ? COVID  09/2020  ? tired sore throat nausea hair fell out loss of taste and smell x 10 days, all symptoms resolved except still has loss of taste and smell  ? Depression   ? GERD (gastroesophageal reflux disease)   ? Hyperlipidemia   ? Hypertension   ? ?Past Surgical History:  ?Procedure Laterality Date  ? ABDOMINAL HYSTERECTOMY    ? partial  ? bladder-partial removal    ? CYSTOSCOPY W/ URETERAL STENT PLACEMENT Left 11/17/2019  ? Procedure: CYSTOSCOPY WITH STENT REPLACEMENT;  Surgeon: Franchot Gallo, MD;  Location: [facility];  Service: Urology;  Laterality: Left;  ? CYSTOSCOPY W/ URETERAL STENT REMOVAL Left 05/10/2020  ? Procedure: CYSTOSCOPY WITH STENT REMOVAL;  Surgeon: Franchot Gallo, MD;  Location: [facility];  Service: Urology;  Laterality: Left;  ? CYSTOSCOPY W/ URETERAL STENT REMOVAL Left 05/02/2021  ? Procedure: CYSTOSCOPY WITH STENT REMOVAL;  Surgeon: Franchot Gallo, MD;  Location: [facility];  Service: Urology;  Laterality: Left;  ? CYSTOSCOPY WITH RETROGRADE PYELOGRAM, URETEROSCOPY AND STENT PLACEMENT Left 06/27/2019  ? Procedure: CYSTOSCOPY WITH LEFT RETROGRADE PYELOGRAM, URETEROSCOPY, BIOPSY AND LEFT STENT PLACEMENT;  Surgeon: Irine Seal, MD;  Location: [facility];  Service: Urology;  Laterality: Left;  ? CYSTOSCOPY WITH RETROGRADE PYELOGRAM, URETEROSCOPY AND STENT PLACEMENT Left 08/18/2019  ? Procedure: CYSTOSCOPY, URETEROSCOPY AND STENT EXCHANGE;  Surgeon: Franchot Gallo, MD;  Location: [facility];  Service: Urology;  Laterality: Left;  90 MINS  ? CYSTOSCOPY WITH RETROGRADE PYELOGRAM, URETEROSCOPY AND STENT PLACEMENT Left 11/17/2019  ? Procedure: CYSTOSCOPY WITH RETROGRADE PYELOGRAM, URETEROSCOPY AND STENT PLACEMENT;  Surgeon: Franchot Gallo, MD;  Location: [facility];  Service:  Urology;  Laterality: Left;  90 MINS  ? CYSTOSCOPY WITH RETROGRADE PYELOGRAM, URETEROSCOPY AND STENT PLACEMENT Left 05/10/2020  ? Procedure: CYSTOSCOPY WITH RETROGRADE  PYELOGRAM, URETEROSCOPY AND STENT PLACEMENT WITH URETHRAL DIALATION AND BRUSH BIOPSY;  Surgeon: Franchot Gallo, MD;  Location: [facility];  Service: Urology;  Laterality: Left;  1 HR  ? CYSTOSCOPY WITH RETROGRADE PYELOGRAM, URETEROSCOPY AND STENT PLACEMENT Left 09/16/2020  ? Procedure: CYSTOSCOPY WITH RETROGRADE PYELOGRAM, URETEROSCOPY ,  LITHOPAXY, LEFT URETERAL BRUSHING, AND STENT REPLACEMENT;  Surgeon: Franchot Gallo, MD;  Location: [facility];  Service: Urology;  Laterality: Left;  ? CYSTOSCOPY WITH RETROGRADE PYELOGRAM, URETEROSCOPY AND STENT PLACEMENT Left 10/24/2021  ? Procedure: CYSTOSCOPY WITH RETROGRADE PYELOGRAM, URETEROSCOPY, POSSIBLE URETERAL AND RENAL BIOPSIES AND STENT PLACEMENT;  Surgeon: Franchot Gallo, MD;  Location: [facility];  Service: Urology;  Laterality: Left;  1 HR  ? CYSTOSCOPY/URETEROSCOPY/HOLMIUM LASER/STENT PLACEMENT Left 05/02/2021  ? Procedure: CYSTOSCOPY/URETEROSCOPY WITH BRUSH BIOPSY/ RETROGRADE PYELOGRAM/ HOLMIUM LASER/STENT REPLACEMENT;  Surgeon: Franchot Gallo, MD;  Location: [facility];  Service: Urology;  Laterality: Left;  ? HOLMIUM LASER APPLICATION Left 22/01/8249  ? Procedure: HOLMIUM LASER APPLICATION;  Surgeon: Franchot Gallo, MD;  Location: [facility];  Service: Urology;  Laterality: Left;  ? HOLMIUM LASER APPLICATION Left 0/37/0488  ? Procedure: HOLMIUM LASER APPLICATION OF TUMORS;  Surgeon: Franchot Gallo, MD;  Location: [facility];  Service: Urology;  Laterality: Left;  ? IR NEPHROSTOMY PLACEMENT LEFT  07/06/2021  ? kidney removed  2006  ? right   ? RIGHT/LEFT HEART CATH AND CORONARY ANGIOGRAPHY N/A 11/06/2019  ? Procedure: RIGHT/LEFT HEART CATH AND CORONARY ANGIOGRAPHY;  Surgeon: Belva Crome, MD;  Location: [facility];  Service: Cardiovascular;  Laterality: N/A;  ? THULIUM LASER TURP (TRANSURETHRAL RESECTION OF PROSTATE) Left 08/18/2019  ?  Procedure: THULIUM LASER ABLATION OF URETERAL TUMOR;  Surgeon: Franchot Gallo, MD;  Location[facility];  Service: Urology;  Laterality: Left;  ? THULIUM LASER TURP (TRANSURETHRAL RESECTION OF PROSTATE) Left 11/17/2019  ? Procedure: THULIUM LASER of URETERAL CANCER;  Surgeon: Franchot Gallo, MD;  Location: [facility];  Service: Urology;  Laterality: Left;  ? ? ? ? ? ?Allergies: ?Erythromycin and Lotensin [benazepril hcl] ? ?Medications: ?Prior to Admission medications   ?Medication Sig Start Date End Date Taking? Authorizing Provider  ?acetaminophen (TYLENOL) 500 MG tablet Take 500 mg by mouth every 6 (six) hours as needed for moderate pain.   Yes [provider]  ?aspirin EC 81 MG tablet Take 1 tablet (81 mg total) by mouth daily. 11/03/19  Yes Nahser, Wonda Cheng, MD  ?calcium carbonate (TUMS - DOSED IN MG ELEMENTAL CALCIUM) 500 MG chewable tablet Chew 1 tablet by mouth every morning.   Yes [provider]  ?carvedilol (COREG) 6.25 MG tablet TAKE 1 TABLET(6.25 MG) BY MOUTH TWICE DAILY WITH A MEAL 11/22/21  Yes Chandrasekhar, Mahesh A, MD  ?cholecalciferol (VITAMIN D3) 25 MCG (1000 UNIT) tablet Take 1,000 Units by mouth daily.   Yes [provider]  ?ezetimibe (ZETIA) 10 MG tablet Take 1 tablet (10 mg total) by mouth daily. 12/21/21  Yes Chandrasekhar, Mahesh A, MD  ?hydrALAZINE (APRESOLINE) 50 MG tablet TAKE 1 TABLET(50 MG) BY MOUTH EVERY 8 HOURS 04/29/21  Yes Chandrasekhar, Mahesh A, MD  ?isosorbide mononitrate (IMDUR) 30 MG 24 hr tablet Take 0.5 tablets (15 mg total) by mouth daily. 12/13/21  Yes Chandrasekhar, Mahesh A, MD  ?rosuvastatin (CRESTOR) 40 MG tablet  Take 1 tablet (40 mg total) by mouth daily. 12/02/21  Yes Chandrasekhar, Mahesh A, MD  ?sertraline (ZOLOFT) 50 MG tablet Take 50 mg by mouth at bedtime.   Yes [provider]  ?traZODone (DESYREL) 150 MG tablet Take 150 mg by mouth at bedtime. 08/24/20  Yes [provider]  ?oxybutynin (DITROPAN) 5 MG  tablet Take 5 mg by mouth as needed. 12/13/21   [provider]  ? ? ? ?Vital Signs: ?BP 130/61   Pulse 70   Temp 97.8 ?F (36.6 ?C) (Oral)   Resp 18   Ht '5\' 3"'$  (1.6 m)   Wt 180 lb (81.6 kg)   SpO2 96%

## 2022-02-07 ENCOUNTER — Ambulatory Visit (HOSPITAL_COMMUNITY): Payer: Medicare PPO

## 2022-02-12 ENCOUNTER — Other Ambulatory Visit: Payer: Self-pay | Admitting: Internal Medicine

## 2022-02-15 DIAGNOSIS — C652 Malignant neoplasm of left renal pelvis: Secondary | ICD-10-CM | POA: Diagnosis not present

## 2022-02-16 ENCOUNTER — Telehealth: Payer: Self-pay | Admitting: Internal Medicine

## 2022-02-16 NOTE — Telephone Encounter (Signed)
Pt c/o medication issue: ? ?1. Name of Medication: Rosuvastatin ? ?2. How are you currently taking this medication (dosage and times per day)?  ? ?3. Are you having a reaction (difficulty breathing--STAT)?  ? ?4. What is your medication issue? Need directions on how to take it ? ?

## 2022-02-16 NOTE — Telephone Encounter (Signed)
Left a message to call back.

## 2022-02-17 NOTE — Telephone Encounter (Signed)
Left a message to call back.

## 2022-02-20 ENCOUNTER — Other Ambulatory Visit: Payer: Medicare PPO | Admitting: *Deleted

## 2022-02-20 ENCOUNTER — Other Ambulatory Visit: Payer: Self-pay | Admitting: Internal Medicine

## 2022-02-20 DIAGNOSIS — E782 Mixed hyperlipidemia: Secondary | ICD-10-CM

## 2022-02-20 DIAGNOSIS — I2510 Atherosclerotic heart disease of native coronary artery without angina pectoris: Secondary | ICD-10-CM | POA: Diagnosis not present

## 2022-02-20 LAB — ALT: ALT: 9 IU/L (ref 0–32)

## 2022-02-20 LAB — LIPID PANEL
Chol/HDL Ratio: 2.6 ratio (ref 0.0–4.4)
Cholesterol, Total: 106 mg/dL (ref 100–199)
HDL: 41 mg/dL (ref >39)
LDL Chol Calc (NIH): 44 mg/dL (ref 0–99)
Triglycerides: 116 mg/dL (ref 0–149)
VLDL Cholesterol Cal: 21 mg/dL (ref 5–40)

## 2022-02-20 NOTE — Telephone Encounter (Signed)
Outpatient Medication Detail ? ? Disp Refills Start End   ?carvedilol (COREG) 6.25 MG tablet 180 tablet 2 11/22/2021    ?Sig: TAKE 1 TABLET(6.25 MG) BY MOUTH TWICE DAILY WITH A MEAL   ?Sent to pharmacy as: carvedilol (COREG) 6.25 MG tablet   ?E-Prescribing Status: Receipt confirmed by pharmacy (11/22/2021  1:33 PM EST)   ? ?Pharmacy ? ?[id number] - [place], [address] AT [address]  ? ?

## 2022-02-23 DIAGNOSIS — C652 Malignant neoplasm of left renal pelvis: Secondary | ICD-10-CM | POA: Diagnosis not present

## 2022-02-23 DIAGNOSIS — N3000 Acute cystitis without hematuria: Secondary | ICD-10-CM | POA: Diagnosis not present

## 2022-02-23 NOTE — Telephone Encounter (Signed)
Left a message for pt to call back if still needs answers to question.  Will sign this encounter as this is the 3rd attempt at reaching pt.

## 2022-03-02 ENCOUNTER — Emergency Department (HOSPITAL_COMMUNITY)
Admission: EM | Admit: 2022-03-02 | Discharge: 2022-03-02 | Disposition: A | Discharge status: Home / Self Care | Payer: Medicare PPO | Attending: Emergency Medicine | Admitting: Emergency Medicine

## 2022-03-02 ENCOUNTER — Emergency Department (HOSPITAL_COMMUNITY): Payer: Medicare PPO

## 2022-03-02 ENCOUNTER — Encounter (HOSPITAL_COMMUNITY): Payer: Self-pay | Admitting: Emergency Medicine

## 2022-03-02 DIAGNOSIS — Z79899 Other long term (current) drug therapy: Secondary | ICD-10-CM | POA: Insufficient documentation

## 2022-03-02 DIAGNOSIS — Y69 Unspecified misadventure during surgical and medical care: Secondary | ICD-10-CM | POA: Insufficient documentation

## 2022-03-02 DIAGNOSIS — T83032A Leakage of nephrostomy catheter, initial encounter: Secondary | ICD-10-CM | POA: Diagnosis not present

## 2022-03-02 DIAGNOSIS — C652 Malignant neoplasm of left renal pelvis: Secondary | ICD-10-CM | POA: Diagnosis not present

## 2022-03-02 DIAGNOSIS — I509 Heart failure, unspecified: Secondary | ICD-10-CM | POA: Insufficient documentation

## 2022-03-02 DIAGNOSIS — N189 Chronic kidney disease, unspecified: Secondary | ICD-10-CM | POA: Insufficient documentation

## 2022-03-02 DIAGNOSIS — T83092A Other mechanical complication of nephrostomy catheter, initial encounter: Secondary | ICD-10-CM | POA: Diagnosis not present

## 2022-03-02 DIAGNOSIS — Z8551 Personal history of malignant neoplasm of bladder: Secondary | ICD-10-CM | POA: Insufficient documentation

## 2022-03-02 DIAGNOSIS — Z7982 Long term (current) use of aspirin: Secondary | ICD-10-CM | POA: Insufficient documentation

## 2022-03-02 DIAGNOSIS — I130 Hypertensive heart and chronic kidney disease with heart failure and stage 1 through stage 4 chronic kidney disease, or unspecified chronic kidney disease: Secondary | ICD-10-CM | POA: Insufficient documentation

## 2022-03-02 DIAGNOSIS — I2510 Atherosclerotic heart disease of native coronary artery without angina pectoris: Secondary | ICD-10-CM | POA: Diagnosis not present

## 2022-03-02 DIAGNOSIS — T83022A Displacement of nephrostomy catheter, initial encounter: Secondary | ICD-10-CM | POA: Diagnosis not present

## 2022-03-02 DIAGNOSIS — R109 Unspecified abdominal pain: Secondary | ICD-10-CM | POA: Diagnosis not present

## 2022-03-02 DIAGNOSIS — R8271 Bacteriuria: Secondary | ICD-10-CM | POA: Diagnosis not present

## 2022-03-02 LAB — URINALYSIS, ROUTINE W REFLEX MICROSCOPIC
Bilirubin Urine: NEGATIVE
Glucose, UA: NEGATIVE mg/dL
Ketones, ur: NEGATIVE mg/dL
Leukocytes,Ua: NEGATIVE
Nitrite: NEGATIVE
Protein, ur: >=300 mg/dL — AB
RBC / HPF: >50 RBC/hpf — ABNORMAL HIGH (ref 0–5)
Specific Gravity, Urine: 1.018 (ref 1.005–1.030)
pH: 6 (ref 5.0–8.0)

## 2022-03-02 LAB — BASIC METABOLIC PANEL
Anion gap: 8 (ref 5–15)
BUN: 18 mg/dL (ref 8–23)
CO2: 29 mmol/L (ref 22–32)
Calcium: 9 mg/dL (ref 8.9–10.3)
Chloride: 103 mmol/L (ref 98–111)
Creatinine, Ser: 1.78 mg/dL — ABNORMAL HIGH (ref 0.44–1.00)
GFR, Estimated: 30 mL/min — ABNORMAL LOW (ref >60)
Glucose, Bld: 125 mg/dL — ABNORMAL HIGH (ref 70–99)
Potassium: 3.2 mmol/L — ABNORMAL LOW (ref 3.5–5.1)
Sodium: 140 mmol/L (ref 135–145)

## 2022-03-02 LAB — CBC WITH DIFFERENTIAL/PLATELET
Abs Immature Granulocytes: 0.08 10*3/uL — ABNORMAL HIGH (ref 0.00–0.07)
Basophils Absolute: 0.1 10*3/uL (ref 0.0–0.1)
Basophils Relative: 1 %
Eosinophils Absolute: 0.5 10*3/uL (ref 0.0–0.5)
Eosinophils Relative: 4 %
HCT: 37.5 % (ref 36.0–46.0)
Hemoglobin: 12.2 g/dL (ref 12.0–15.0)
Immature Granulocytes: 1 %
Lymphocytes Relative: 8 %
Lymphs Abs: 1.1 10*3/uL (ref 0.7–4.0)
MCH: 30.3 pg (ref 26.0–34.0)
MCHC: 32.5 g/dL (ref 30.0–36.0)
MCV: 93.1 fL (ref 80.0–100.0)
Monocytes Absolute: 0.8 10*3/uL (ref 0.1–1.0)
Monocytes Relative: 6 %
Neutro Abs: 10.6 10*3/uL — ABNORMAL HIGH (ref 1.7–7.7)
Neutrophils Relative %: 80 %
Platelets: 241 10*3/uL (ref 150–400)
RBC: 4.03 MIL/uL (ref 3.87–5.11)
RDW: 13.4 % (ref 11.5–15.5)
WBC: 13.1 10*3/uL — ABNORMAL HIGH (ref 4.0–10.5)
nRBC: 0 % (ref 0.0–0.2)

## 2022-03-02 MED ORDER — SODIUM CHLORIDE 0.9 % IV BOLUS
1000.0000 mL | Freq: Once | INTRAVENOUS | Status: AC
  Administered 2022-03-02: 1000 mL via INTRAVENOUS

## 2022-03-02 MED ORDER — ONDANSETRON HCL 4 MG/2ML IJ SOLN
4.0000 mg | Freq: Once | INTRAMUSCULAR | Status: AC
  Administered 2022-03-02: 4 mg via INTRAVENOUS
  Filled 2022-03-02: qty 2

## 2022-03-02 MED ORDER — MORPHINE SULFATE (PF) 4 MG/ML IV SOLN
4.0000 mg | Freq: Once | INTRAVENOUS | Status: AC
  Administered 2022-03-02: 4 mg via INTRAVENOUS
  Filled 2022-03-02: qty 1

## 2022-03-02 NOTE — Discharge Instructions (Addendum)
Keep drainage bag attached to nephrostomy tube.

## 2022-03-02 NOTE — ED Provider Notes (Signed)
[place] DEPT Provider Note   CSN: [id number] Arrival date & time: 03/02/22  1850     History  Chief Complaint  Patient presents with   Drainage from Incision    [name] is a 76 y.o. female.  Pt is a 76 yo female with a pmhx significant for hyperlipidemia, depression, anxiety, htn, chf, CAD, ckd, GERD and urothelial carcinoma.  She is s/p right nephro ureterectomy and prior TURBT for high-grade right ureteral and bladder tumors.  She then developed a left distal ureteral urothelial tumor.  She underwent laser ablation and underwent left perc nephrostomy placement.  This was removed, but then developed recurrence and a new left perc nephrostomy was placed by IR on 5/1.Marland Kitchen  She is getting Jelmyto treatments.  She had treatment today, then came home and fluid started coming out around the nephrostomy tube.  Pt said pain is severe.  The NP from urology came over and attached a nephrostomy drain bag and attached it to the nephrostomy tube.        Home Medications Prior to Admission medications   Medication Sig Start Date End Date Taking? Authorizing Provider  acetaminophen (TYLENOL) 500 MG tablet Take 500 mg by mouth every 6 (six) hours as needed for moderate pain.    [provider]  aspirin EC 81 MG tablet Take 1 tablet (81 mg total) by mouth daily. 11/03/19   Nahser, Wonda Cheng, MD  calcium carbonate (TUMS - DOSED IN MG ELEMENTAL CALCIUM) 500 MG chewable tablet Chew 1 tablet by mouth every morning.    [provider]  carvedilol (COREG) 6.25 MG tablet TAKE 1 TABLET(6.25 MG) BY MOUTH TWICE DAILY WITH A MEAL 11/22/21   Chandrasekhar, Mahesh A, MD  cholecalciferol (VITAMIN D3) 25 MCG (1000 UNIT) tablet Take 1,000 Units by mouth daily.    [provider]  ezetimibe (ZETIA) 10 MG tablet Take 1 tablet (10 mg total) by mouth daily. 12/21/21   Chandrasekhar, Mahesh A, MD  hydrALAZINE (APRESOLINE) 50 MG tablet TAKE 1 TABLET(50 MG)  BY MOUTH EVERY 8 HOURS 02/13/22   Chandrasekhar, Mahesh A, MD  isosorbide mononitrate (IMDUR) 30 MG 24 hr tablet Take 0.5 tablets (15 mg total) by mouth daily. 12/13/21   Chandrasekhar, Lyda Kalata A, MD  oxybutynin (DITROPAN) 5 MG tablet Take 5 mg by mouth as needed. 12/13/21   [provider]  oxyCODONE (ROXICODONE) 5 MG immediate release tablet Take 1 tablet (5 mg total) by mouth every 6 (six) hours as needed for severe pain. 02/06/22   Allred, Darrell K, PA-C  rosuvastatin (CRESTOR) 40 MG tablet Take 1 tablet (40 mg total) by mouth daily. 12/02/21   Rudean Haskell A, MD  sertraline (ZOLOFT) 50 MG tablet Take 50 mg by mouth at bedtime.    [provider]  traZODone (DESYREL) 150 MG tablet Take 150 mg by mouth at bedtime. 08/24/20   [provider]      Allergies    Erythromycin and Lotensin [benazepril hcl]    Review of Systems   Review of Systems  Musculoskeletal:  Positive for back pain.  All other systems reviewed and are negative.  Physical Exam Updated Vital Signs BP (!) 108/51   Pulse 73   Temp (!) 96.8 F (36 C) (Axillary)   Resp 17   Ht '5\' 3"'$  (1.6 m)   Wt 68 kg   SpO2 91%   BMI 26.57 kg/m  Physical Exam  ED Results / Procedures / Treatments  Labs (all labs ordered are listed, but only abnormal results are displayed) Labs Reviewed  CBC WITH DIFFERENTIAL/PLATELET - Abnormal; Notable for the following components:      Result Value   WBC 13.1 (*)    Neutro Abs 10.6 (*)    Abs Immature Granulocytes 0.08 (*)    All other components within normal limits  BASIC METABOLIC PANEL - Abnormal; Notable for the following components:   Potassium 3.2 (*)    Glucose, Bld 125 (*)    Creatinine, Ser 1.78 (*)    GFR, Estimated 30 (*)    All other components within normal limits  URINALYSIS, ROUTINE W REFLEX MICROSCOPIC - Abnormal; Notable for the following components:   APPearance HAZY (*)    Hgb urine dipstick SMALL (*)    Protein, ur >=300 (*)    RBC /  HPF >50 (*)    Bacteria, UA RARE (*)    All other components within normal limits    EKG None  Radiology CT ABDOMEN PELVIS WO CONTRAST  Result Date: 03/02/2022 CLINICAL DATA:  Abdominal pain. Patient receiving chemotherapy for urothelial carcinoma via indwelling left nephrostomy. Catheter leak. EXAM: CT ABDOMEN AND PELVIS WITHOUT CONTRAST TECHNIQUE: Multidetector CT imaging of the abdomen and pelvis was performed following the standard protocol without IV contrast. RADIATION DOSE REDUCTION: This exam was performed according to the departmental dose-optimization program which includes automated exposure control, adjustment of the mA and/or kV according to patient size and/or use of iterative reconstruction technique. COMPARISON:  06/07/2021 FINDINGS: Limited evaluation due to lack of intravenous contrast administration. Lower chest: Mild subpleural scarring at the lung bases. Hepatobiliary: Unenhanced liver is unremarkable. Gallbladder is unremarkable. No intrahepatic or extrahepatic ductal dilatation. Pancreas: Within normal limits. Spleen: Within normal limits. Adrenals/Urinary Tract: Adrenal glands are within normal limits. Status post right nephrectomy. Left perirenal edema. Indwelling nephroureterostomy catheter in satisfactory position. Mild perinephric and periureteral stranding. No hydronephrosis. Bladder is underdistended with asymmetric left posterolateral bladder wall thickening (series 2/image 79), poorly evaluated. Distal ureteral catheter in satisfactory position. Stomach/Bowel: Stomach is within normal limits. No evidence of bowel obstruction. Normal appendix (series 2/image 55). Mild sigmoid diverticulosis, without evidence of diverticulitis. Vascular/Lymphatic: No evidence of abdominal aortic aneurysm. Atherosclerotic calcifications of the abdominal aorta and branch vessels. Clustered left para-aortic nodes measuring up to 10 mm short axis (series 2/image 42), new/progressive, suspicious.  Reproductive: Status post hysterectomy. Bilateral ovaries are within normal limits. Other: No abdominopelvic ascites. Musculoskeletal: Mild degenerative changes of the visualized thoracolumbar spine. IMPRESSION: Limited evaluation due to lack of intravenous contrast administration. Left perirenal edema. Mild perinephric and periureteral stranding. Indwelling left nephroureterostomy catheter in satisfactory position. No hydronephrosis. Clustered left para-aortic nodes measuring up to 10 mm short axis, new/progressive, suspicious for nodal metastases. Asymmetric left posterolateral bladder wall thickening, poorly evaluated. Status post right nephrectomy. Electronically Signed   By: Julian Hy M.D.   On: 03/02/2022 21:02    Procedures Procedures    Medications Ordered in ED Medications  sodium chloride 0.9 % bolus 1,000 mL (0 mLs Intravenous Stopped 03/02/22 2053)  morphine (PF) 4 MG/ML injection 4 mg (4 mg Intravenous Given 03/02/22 2002)  ondansetron (ZOFRAN) injection 4 mg (4 mg Intravenous Given 03/02/22 1959)    ED Course/ Medical Decision Making/ A&P                           Medical Decision Making Amount and/or Complexity of Data Reviewed Labs: ordered. Radiology: ordered.  Risk Prescription drug management.   This patient presents to the ED for concern of flank pain, this involves an extensive number of treatment options, and is a complaint that carries with it a high risk of complications and morbidity.  The differential diagnosis includes pyelo, tube malfunction   Co morbidities that complicate the patient evaluation  hyperlipidemia, depression, anxiety, htn, chf, CAD, ckd, GERD and urothelial carcinoma.   Additional history obtained:  Additional history obtained from epic chart review External records from outside source obtained and reviewed including family   Lab Tests:  I Ordered, and personally interpreted labs.  The pertinent results include:  cbc with wbc  13.1; bmp with slight bump in Cr at 1.78: ua with >50 RBCs, but no signs of infection.   Imaging Studies ordered:  I ordered imaging studies including ct abd/pelvis  I independently visualized and interpreted imaging which showed    IMPRESSION:  Limited evaluation due to lack of intravenous contrast  administration.     Left perirenal edema. Mild perinephric and periureteral stranding.  Indwelling left nephroureterostomy catheter in satisfactory  position. No hydronephrosis.     Clustered left para-aortic nodes measuring up to 10 mm short axis,  new/progressive, suspicious for nodal metastases.     Asymmetric left posterolateral bladder wall thickening, poorly  evaluated.     Status post right nephrectomy.      I agree with the radiologist interpretation   Cardiac Monitoring:  The patient was maintained on a cardiac monitor.  I personally viewed and interpreted the cardiac monitored which showed an underlying rhythm of: nsr   Medicines ordered and prescription drug management:  I ordered medication including morphine/zofran  for pain and nausea  Reevaluation of the patient after these medicines showed that the patient improved I have reviewed the patients home medicines and have made adjustments as needed   Test Considered:  ct   Critical Interventions:  Pain control   Consultations Obtained:  I requested consultation with the urologist (Dr. Abner Greenspan),  and discussed lab and imaging findings as well as pertinent plan - He reviewed the CT scan.  Pt has no hydro and tube looks to be in good position.  Tube is draining urine.  He feels pt can go home as long as pain is controlled.  He will have the office call for a follow up appt.  Problem List / ED Course:  Nephrostomy tube pain:  Urology will f/u.  Tube appears to be functioning with no obstructions.  Pt has pain meds at home.  Her pain is well controlled here.   Reevaluation:  After the interventions noted  above, I reevaluated the patient and found that they have :improved   Social Determinants of Health:  Lives at home with husband   Dispostion:  After consideration of the diagnostic results and the patients response to treatment, I feel that the patent would benefit from discharge with outpatient f/u.          Final Clinical Impression(s) / ED Diagnoses Final diagnoses:  Leakage of nephrostomy catheter, initial encounter [facility])    Rx / [place] Orders ED Discharge Orders     None         Isla Pence, MD 03/02/22 2257

## 2022-03-02 NOTE — ED Triage Notes (Signed)
Pt having chemo gel infused into L kidney. It is very painful to patient and leaking. Urology NP at bedside to assist. RN retrieved nephrostomy drain bag from IR and NP attached. Draining purple chemo fluid into bag. Will need to be disposed in biohazard bag if emptied here.

## 2022-03-07 ENCOUNTER — Other Ambulatory Visit: Payer: Self-pay | Admitting: Urology

## 2022-03-07 DIAGNOSIS — C662 Malignant neoplasm of left ureter: Secondary | ICD-10-CM | POA: Diagnosis not present

## 2022-03-08 ENCOUNTER — Encounter (HOSPITAL_BASED_OUTPATIENT_CLINIC_OR_DEPARTMENT_OTHER): Payer: Self-pay | Admitting: Urology

## 2022-03-08 ENCOUNTER — Other Ambulatory Visit: Payer: Self-pay

## 2022-03-08 NOTE — Progress Notes (Addendum)
Spoke w/ via phone for pre-op interview---pt Lab needs dos----       istat  per anesthesia, surgery orders need 2nd sign      Lab results------see below COVID test -----patient states asymptomatic no test needed Arrive at -------1200 pm 03-09-2022 NPO after MN NO Solid Food.  Clear liquids from MN until---1100 am Med rec completed Medications to take morning of surgery -----carvedilol, hydrazaline, imdur Diabetic medication -----n/a Patient instructed no nail polish to be worn day of surgery Patient instructed to bring photo id and insurance card day of surgery Patient aware to have Driver (ride ) / caregiver  spouse Truman Hayward will drop pt off   for 24 hours after surgery  Patient Special Instructions -----none Pre-Op special Istructions -----none Patient verbalized understanding of instructions that were given at this phone interview. Patient denies shortness of breath, chest pain, fever, cough at this phone interview.   Anesthesia :  PCP:dr timberlake,  Cardiologist: dr Gaspar Bidding lov 12-21-2021 epic EKG :12-21-2021 chart/epic Echo :[id number] epic lvef 70 to 75 % Stress test:none Cardiac Cath :  11-06-2019 epic  ASA / Instructions/ Last Dose :  last dose 81 mg aspirin was 03-07-2022 per pt

## 2022-03-09 ENCOUNTER — Encounter (HOSPITAL_BASED_OUTPATIENT_CLINIC_OR_DEPARTMENT_OTHER)
Admission: RE | Disposition: A | Discharge status: Home / Self Care | Payer: Self-pay | Source: Home / Self Care | Attending: Urology

## 2022-03-09 ENCOUNTER — Ambulatory Visit (HOSPITAL_BASED_OUTPATIENT_CLINIC_OR_DEPARTMENT_OTHER): Payer: Medicare PPO | Admitting: Certified Registered Nurse Anesthetist

## 2022-03-09 ENCOUNTER — Other Ambulatory Visit: Payer: Self-pay

## 2022-03-09 ENCOUNTER — Ambulatory Visit (HOSPITAL_BASED_OUTPATIENT_CLINIC_OR_DEPARTMENT_OTHER)
Admission: RE | Admit: 2022-03-09 | Discharge: 2022-03-09 | Disposition: A | Discharge status: Home / Self Care | Payer: Medicare PPO | Attending: Urology | Admitting: Urology

## 2022-03-09 ENCOUNTER — Encounter (HOSPITAL_BASED_OUTPATIENT_CLINIC_OR_DEPARTMENT_OTHER): Payer: Self-pay | Admitting: Urology

## 2022-03-09 DIAGNOSIS — Z01818 Encounter for other preprocedural examination: Secondary | ICD-10-CM

## 2022-03-09 DIAGNOSIS — I509 Heart failure, unspecified: Secondary | ICD-10-CM | POA: Diagnosis not present

## 2022-03-09 DIAGNOSIS — C679 Malignant neoplasm of bladder, unspecified: Secondary | ICD-10-CM | POA: Insufficient documentation

## 2022-03-09 DIAGNOSIS — I5042 Chronic combined systolic (congestive) and diastolic (congestive) heart failure: Secondary | ICD-10-CM

## 2022-03-09 DIAGNOSIS — T83192A Other mechanical complication of urinary stent, initial encounter: Secondary | ICD-10-CM | POA: Diagnosis not present

## 2022-03-09 DIAGNOSIS — Z87891 Personal history of nicotine dependence: Secondary | ICD-10-CM | POA: Insufficient documentation

## 2022-03-09 DIAGNOSIS — I130 Hypertensive heart and chronic kidney disease with heart failure and stage 1 through stage 4 chronic kidney disease, or unspecified chronic kidney disease: Secondary | ICD-10-CM | POA: Diagnosis not present

## 2022-03-09 DIAGNOSIS — I110 Hypertensive heart disease with heart failure: Secondary | ICD-10-CM | POA: Diagnosis not present

## 2022-03-09 DIAGNOSIS — C662 Malignant neoplasm of left ureter: Secondary | ICD-10-CM | POA: Insufficient documentation

## 2022-03-09 DIAGNOSIS — T83091A Other mechanical complication of indwelling urethral catheter, initial encounter: Secondary | ICD-10-CM

## 2022-03-09 DIAGNOSIS — I10 Essential (primary) hypertension: Secondary | ICD-10-CM

## 2022-03-09 DIAGNOSIS — C688 Malignant neoplasm of overlapping sites of urinary organs: Secondary | ICD-10-CM | POA: Diagnosis not present

## 2022-03-09 DIAGNOSIS — I2510 Atherosclerotic heart disease of native coronary artery without angina pectoris: Secondary | ICD-10-CM

## 2022-03-09 HISTORY — PX: CYSTOSCOPY WITH RETROGRADE PYELOGRAM, URETEROSCOPY AND STENT PLACEMENT: SHX5789

## 2022-03-09 LAB — POCT I-STAT, CHEM 8
BUN: 11 mg/dL (ref 8–23)
Calcium, Ion: 1.14 mmol/L — ABNORMAL LOW (ref 1.15–1.40)
Chloride: 100 mmol/L (ref 98–111)
Creatinine, Ser: 1.3 mg/dL — ABNORMAL HIGH (ref 0.44–1.00)
Glucose, Bld: 104 mg/dL — ABNORMAL HIGH (ref 70–99)
HCT: 35 % — ABNORMAL LOW (ref 36.0–46.0)
Hemoglobin: 11.9 g/dL — ABNORMAL LOW (ref 12.0–15.0)
Potassium: 3.2 mmol/L — ABNORMAL LOW (ref 3.5–5.1)
Sodium: 139 mmol/L (ref 135–145)
TCO2: 27 mmol/L (ref 22–32)

## 2022-03-09 SURGERY — CYSTOURETEROSCOPY, WITH RETROGRADE PYELOGRAM AND STENT INSERTION
Anesthesia: General | Site: Renal | Laterality: Left

## 2022-03-09 MED ORDER — FENTANYL CITRATE (PF) 100 MCG/2ML IJ SOLN
INTRAMUSCULAR | Status: AC
  Filled 2022-03-09: qty 2

## 2022-03-09 MED ORDER — PROPOFOL 10 MG/ML IV BOLUS
INTRAVENOUS | Status: DC | PRN
  Administered 2022-03-09: 130 mg via INTRAVENOUS

## 2022-03-09 MED ORDER — PROPOFOL 10 MG/ML IV BOLUS
INTRAVENOUS | Status: AC
  Filled 2022-03-09: qty 20

## 2022-03-09 MED ORDER — LIDOCAINE 2% (20 MG/ML) 5 ML SYRINGE
INTRAMUSCULAR | Status: DC | PRN
  Administered 2022-03-09: 40 mg via INTRAVENOUS

## 2022-03-09 MED ORDER — MIDAZOLAM HCL 2 MG/2ML IJ SOLN
INTRAMUSCULAR | Status: DC | PRN
  Administered 2022-03-09: 1 mg via INTRAVENOUS

## 2022-03-09 MED ORDER — MIDAZOLAM HCL 2 MG/2ML IJ SOLN
INTRAMUSCULAR | Status: AC
Start: 2022-03-09 — End: ?
  Filled 2022-03-09: qty 2

## 2022-03-09 MED ORDER — CEPHALEXIN 500 MG PO CAPS: 500.0000 mg | ORAL_CAPSULE | Freq: Two times a day (BID) | ORAL | 0 refills | Status: AC

## 2022-03-09 MED ORDER — CEFAZOLIN SODIUM-DEXTROSE 2-3 GM-%(50ML) IV SOLR
INTRAVENOUS | Status: DC | PRN
  Administered 2022-03-09: 2 g via INTRAVENOUS

## 2022-03-09 MED ORDER — LACTATED RINGERS IV SOLN: INTRAVENOUS | Status: DC | PRN

## 2022-03-09 MED ORDER — SODIUM CHLORIDE 0.9 % IV SOLN: INTRAVENOUS | Status: DC

## 2022-03-09 MED ORDER — DEXAMETHASONE SODIUM PHOSPHATE 10 MG/ML IJ SOLN
INTRAMUSCULAR | Status: DC | PRN
  Administered 2022-03-09: 5 mg via INTRAVENOUS

## 2022-03-09 MED ORDER — IOHEXOL 300 MG/ML  SOLN
INTRAMUSCULAR | Status: DC | PRN
  Administered 2022-03-09: 15 mL via URETHRAL

## 2022-03-09 MED ORDER — SODIUM CHLORIDE 0.9 % IR SOLN
Status: DC | PRN
  Administered 2022-03-09: 3000 mL

## 2022-03-09 MED ORDER — CEFAZOLIN SODIUM-DEXTROSE 2-4 GM/100ML-% IV SOLN
INTRAVENOUS | Status: AC
  Filled 2022-03-09: qty 100

## 2022-03-09 MED ORDER — ONDANSETRON HCL 4 MG/2ML IJ SOLN
INTRAMUSCULAR | Status: DC | PRN
  Administered 2022-03-09: 4 mg via INTRAVENOUS

## 2022-03-09 MED ORDER — ACETAMINOPHEN 10 MG/ML IV SOLN: 1000.0000 mg | Freq: Once | INTRAVENOUS | Status: DC | PRN

## 2022-03-09 MED ORDER — EPHEDRINE SULFATE (PRESSORS) 50 MG/ML IJ SOLN
INTRAMUSCULAR | Status: DC | PRN
  Administered 2022-03-09 (×2): 5 mg via INTRAVENOUS

## 2022-03-09 MED ORDER — FENTANYL CITRATE (PF) 250 MCG/5ML IJ SOLN
INTRAMUSCULAR | Status: DC | PRN
  Administered 2022-03-09: 50 ug via INTRAVENOUS

## 2022-03-09 MED ORDER — FENTANYL CITRATE (PF) 100 MCG/2ML IJ SOLN: 25.0000 ug | INTRAMUSCULAR | Status: DC | PRN

## 2022-03-09 SURGICAL SUPPLY — 24 items
BAG DRAIN URO-CYSTO SKYTR STRL (DRAIN) ×2 IMPLANT
BAG DRN UROCATH (DRAIN) ×1
BASKET ZERO TIP NITINOL 2.4FR (BASKET) IMPLANT
BSKT STON RTRVL ZERO TP 2.4FR (BASKET)
CATH INTERMIT  6FR 70CM (CATHETERS) ×1 IMPLANT
CLOTH BEACON ORANGE TIMEOUT ST (SAFETY) ×2 IMPLANT
COVER DOME SNAP 22 D (MISCELLANEOUS) ×1 IMPLANT
ELECT REM PT RETURN 9FT ADLT (ELECTROSURGICAL)
ELECTRODE REM PT RTRN 9FT ADLT (ELECTROSURGICAL) IMPLANT
FIBER LASER FLEXIVA 365 (UROLOGICAL SUPPLIES) IMPLANT
GLOVE BIO SURGEON STRL SZ8 (GLOVE) ×2 IMPLANT
GOWN STRL REUS W/TWL XL LVL3 (GOWN DISPOSABLE) ×2 IMPLANT
GUIDEWIRE ANG ZIPWIRE 038X150 (WIRE) IMPLANT
GUIDEWIRE STR DUAL SENSOR (WIRE) IMPLANT
IV NS IRRIG 3000ML ARTHROMATIC (IV SOLUTION) ×3 IMPLANT
KIT TURNOVER CYSTO (KITS) ×2 IMPLANT
MANIFOLD NEPTUNE II (INSTRUMENTS) ×2 IMPLANT
NS IRRIG 500ML POUR BTL (IV SOLUTION) ×1 IMPLANT
PACK CYSTO (CUSTOM PROCEDURE TRAY) ×2 IMPLANT
SHEATH URETERAL 12FRX35CM (MISCELLANEOUS) IMPLANT
TRACTIP FLEXIVA PULS ID 200XHI (Laser) IMPLANT
TRACTIP FLEXIVA PULSE ID 200 (Laser)
TUBE CONNECTING 12X1/4 (SUCTIONS) ×1 IMPLANT
TUBING UROLOGY SET (TUBING) IMPLANT

## 2022-03-09 NOTE — Anesthesia Procedure Notes (Signed)
Procedure Name: LMA Insertion Date/Time: 03/09/2022 2:08 PM Performed by: Clearnce Sorrel, CRNA Pre-anesthesia Checklist: Patient identified, Emergency Drugs available, Suction available and Patient being monitored Patient Re-evaluated:Patient Re-evaluated prior to induction Oxygen Delivery Method: Circle System Utilized Preoxygenation: Pre-oxygenation with 100% oxygen Induction Type: IV induction Ventilation: Mask ventilation without difficulty LMA: LMA inserted LMA Size: 4.0 Number of attempts: 1 Airway Equipment and Method: Bite block Placement Confirmation: positive ETCO2 Tube secured with: Tape Dental Injury: Teeth and Oropharynx as per pre-operative assessment

## 2022-03-09 NOTE — H&P (View-Only) (Signed)
H&P  Chief Complaint: Left ureteral/renal pelvic cancer, occluded stent  History of Present Illness: [name] is a 76 y.o. year old female who presents at this time for cystoscopy, stent removal on the left, antegrade nephrostogram and possible stent replacement.  She has a longstanding history of urothelial carcinoma.  She is status post right nephro ureterectomy years ago.  There was a long interval between her curative procedure and recurrence of the urothelial carcinoma in her left ureter which was approximately 2 years ago.  More recently, she had no evidence of recurrence in her ureter but on cytology washings of the left renal pelvis/calyceal system were positive for high-grade urothelial carcinoma although only very small lesions could be seen.  She underwent 6 instillations by way of nephrostomy tube of Jelmyto in 2022.  She recently restarted these 2 weeks ago.  Last week, she had significant pain with the treatment, and after the nephrostomy tube was clamped (she has an indwelling stent), she had significant pain with no production of urine.  Nephrostomy tube was uncapped, and since then most of her urine was coming out through her nephrostomy tube, very little from her bladder.  She presents at this time for cystoscopy, removal of her left ureteral stent, nephrostogram and, if no significant antegrade passage of the contrast, replacement of her double-J stent.  Past Medical History:  Diagnosis Date   Anxiety    CAD in native artery    a. cath 10/2019- mild to moderate dx >> medical therapy    Cancer ([place])    left ureteral cancer dx sept 2020   CHF (congestive heart failure) ([place])    Chronic kidney disease 06/2019   acute renal insufficiency sees dr Dorina Hoyer, ckd stage 3 per pt   COVID 09/2020   tired sore throat nausea hair fell out loss of taste and smell x 10 days, all symptoms resolved except still has loss of taste and smell   Depression    GERD (gastroesophageal  reflux disease)    Hyperlipidemia    Hypertension     Past Surgical History:  Procedure Laterality Date   ABDOMINAL HYSTERECTOMY     partial   bladder-partial removal     CYSTOSCOPY W/ URETERAL STENT PLACEMENT Left 11/17/2019   Procedure: CYSTOSCOPY WITH STENT REPLACEMENT;  Surgeon: Franchot Gallo, MD;  Location: [facility];  Service: Urology;  Laterality: Left;   CYSTOSCOPY W/ URETERAL STENT REMOVAL Left 05/10/2020   Procedure: CYSTOSCOPY WITH STENT REMOVAL;  Surgeon: Franchot Gallo, MD;  Location: [facility];  Service: Urology;  Laterality: Left;   CYSTOSCOPY W/ URETERAL STENT REMOVAL Left 05/02/2021   Procedure: CYSTOSCOPY WITH STENT REMOVAL;  Surgeon: Franchot Gallo, MD;  Location: [facility];  Service: Urology;  Laterality: Left;   CYSTOSCOPY WITH RETROGRADE PYELOGRAM, URETEROSCOPY AND STENT PLACEMENT Left 06/27/2019   Procedure: CYSTOSCOPY WITH LEFT RETROGRADE PYELOGRAM, URETEROSCOPY, BIOPSY AND LEFT STENT PLACEMENT;  Surgeon: Irine Seal, MD;  Location: [facility];  Service: Urology;  Laterality: Left;   CYSTOSCOPY WITH RETROGRADE PYELOGRAM, URETEROSCOPY AND STENT PLACEMENT Left 08/18/2019   Procedure: CYSTOSCOPY, URETEROSCOPY AND STENT EXCHANGE;  Surgeon: Franchot Gallo, MD;  Location: [facility];  Service: Urology;  Laterality: Left;  64 MINS   CYSTOSCOPY WITH RETROGRADE PYELOGRAM, URETEROSCOPY AND STENT PLACEMENT Left 11/17/2019   Procedure: CYSTOSCOPY WITH RETROGRADE PYELOGRAM, URETEROSCOPY AND STENT PLACEMENT;  Surgeon: Franchot Gallo, MD;  Location: [facility];  Service: Urology;  Laterality: Left;  90 MINS  CYSTOSCOPY WITH RETROGRADE PYELOGRAM, URETEROSCOPY AND STENT PLACEMENT Left 05/10/2020   Procedure: CYSTOSCOPY WITH RETROGRADE PYELOGRAM, URETEROSCOPY AND STENT PLACEMENT WITH URETHRAL DIALATION AND BRUSH BIOPSY;  Surgeon: Franchot Gallo, MD;  Location: [facility];  Service: Urology;   Laterality: Left;  1 HR   CYSTOSCOPY WITH RETROGRADE PYELOGRAM, URETEROSCOPY AND STENT PLACEMENT Left 09/16/2020   Procedure: CYSTOSCOPY WITH RETROGRADE PYELOGRAM, URETEROSCOPY ,  LITHOPAXY, LEFT URETERAL BRUSHING, AND STENT REPLACEMENT;  Surgeon: Franchot Gallo, MD;  Location: [facility];  Service: Urology;  Laterality: Left;   CYSTOSCOPY WITH RETROGRADE PYELOGRAM, URETEROSCOPY AND STENT PLACEMENT Left 10/24/2021   Procedure: CYSTOSCOPY WITH RETROGRADE PYELOGRAM, URETEROSCOPY, POSSIBLE URETERAL AND RENAL BIOPSIES AND STENT PLACEMENT;  Surgeon: Franchot Gallo, MD;  Location: [facility];  Service: Urology;  Laterality: Left;  1 HR   CYSTOSCOPY/URETEROSCOPY/HOLMIUM LASER/STENT PLACEMENT Left 05/02/2021   Procedure: CYSTOSCOPY/URETEROSCOPY WITH BRUSH BIOPSY/ RETROGRADE PYELOGRAM/ HOLMIUM LASER/STENT REPLACEMENT;  Surgeon: Franchot Gallo, MD;  Location: [facility];  Service: Urology;  Laterality: Left;   HOLMIUM LASER APPLICATION Left 08/13/7261   Procedure: HOLMIUM LASER APPLICATION;  Surgeon: Franchot Gallo, MD;  Location: [facility];  Service: Urology;  Laterality: Left;   HOLMIUM LASER APPLICATION Left 0/35/5974   Procedure: HOLMIUM LASER APPLICATION OF TUMORS;  Surgeon: Franchot Gallo, MD;  Location: [facility];  Service: Urology;  Laterality: Left;   IR NEPHROSTOMY PLACEMENT LEFT  07/06/2021   IR NEPHROSTOMY PLACEMENT LEFT  02/06/2022   kidney removed  2006   right    RIGHT/LEFT HEART CATH AND CORONARY ANGIOGRAPHY N/A 11/06/2019   Procedure: RIGHT/LEFT HEART CATH AND CORONARY ANGIOGRAPHY;  Surgeon: Belva Crome, MD;  Location: [facility];  Service: Cardiovascular;  Laterality: N/A;   THULIUM LASER TURP (TRANSURETHRAL RESECTION OF PROSTATE) Left 08/18/2019   Procedure: THULIUM LASER ABLATION OF URETERAL TUMOR;  Surgeon: Franchot Gallo, MD;  Location: [facility];  Service: Urology;  Laterality:  Left;   THULIUM LASER TURP (TRANSURETHRAL RESECTION OF PROSTATE) Left 11/17/2019   Procedure: THULIUM LASER of URETERAL CANCER;  Surgeon: Franchot Gallo, MD;  Location: [facility];  Service: Urology;  Laterality: Left;    Home Medications:  Medications Prior to Admission  Medication Sig Dispense Refill   acetaminophen (TYLENOL) 500 MG tablet Take 500 mg by mouth every 6 (six) hours as needed for moderate pain.     aspirin EC 81 MG tablet Take 1 tablet (81 mg total) by mouth daily.     calcium carbonate (TUMS - DOSED IN MG ELEMENTAL CALCIUM) 500 MG chewable tablet Chew 1 tablet by mouth every morning.     carvedilol (COREG) 6.25 MG tablet TAKE 1 TABLET(6.25 MG) BY MOUTH TWICE DAILY WITH A MEAL 180 tablet 2   cholecalciferol (VITAMIN D3) 25 MCG (1000 UNIT) tablet Take 1,000 Units by mouth daily.     ezetimibe (ZETIA) 10 MG tablet Take 1 tablet (10 mg total) by mouth daily. 90 tablet 3   hydrALAZINE (APRESOLINE) 50 MG tablet TAKE 1 TABLET(50 MG) BY MOUTH EVERY 8 HOURS 270 tablet 3   isosorbide mononitrate (IMDUR) 30 MG 24 hr tablet Take 0.5 tablets (15 mg total) by mouth daily. 45 tablet 3   oxyCODONE (ROXICODONE) 5 MG immediate release tablet Take 1 tablet (5 mg total) by mouth every 6 (six) hours as needed for severe pain. 20 tablet 0   rosuvastatin (CRESTOR) 40 MG tablet Take 1 tablet (40 mg total) by mouth daily. Ramona  tablet 3   sertraline (ZOLOFT) 50 MG tablet Take 50 mg by mouth at bedtime.     traZODone (DESYREL) 150 MG tablet Take 150 mg by mouth at bedtime.     oxybutynin (DITROPAN) 5 MG tablet Take 5 mg by mouth as needed.      Allergies:  Allergies  Allergen Reactions   Erythromycin Diarrhea and Nausea And Vomiting   Lotensin [Benazepril Hcl] Cough    Family History  Problem Relation Age of Onset   Hypertension Mother    Diabetes Mellitus II Sister    Hypertension Sister    Hypertension Brother     Social History:  reports that she quit smoking about 18  years ago. Her smoking use included cigarettes. She has a 19.00 pack-year smoking history. She has never used smokeless tobacco. She reports that she does not drink alcohol and does not use drugs.  ROS: A complete review of systems was performed.  All systems are negative except for pertinent findings as noted.  Physical Exam:  Vital signs in last 24 hours: Temp:  [97 F (36.1 C)] 97 F (36.1 C) (06/01 1202) Pulse Rate:  [67] 67 (06/01 1202) Resp:  [18] 18 (06/01 1202) BP: (127)/(58) 127/58 (06/01 1202) SpO2:  [96 %] 96 % (06/01 1202) Weight:  [79.4 kg] 79.4 kg (06/01 1202) General:  Alert and oriented, No acute distress HEENT: Normocephalic, atraumatic Neck: No JVD or lymphadenopathy Cardiovascular: Regular rate  Lungs: Normal inspiratory/expiratory excursion Abdomen: Soft, nontender, nondistended, no abdominal masses Back: No CVA tenderness Extremities: No edema Neurologic: Grossly intact  I have reviewed prior pt notes  I have reviewed notes from referring/previous physicians  I have reviewed urinalysis results   Impression/Assessment:  Urothelial carcinoma of the left ureter/pyelocalyceal system, with probable obstruction of her left double-J stent  Plan:  Cystoscopy, extraction of her left double-J stent, antegrade nephrostogram, possible replacement of left double-J stent.  Lillette Boxer Alaa Eyerman 03/09/2022, 1:40 PM  Lillette Boxer. Laiyla Slagel MD

## 2022-03-09 NOTE — Transfer of Care (Signed)
Immediate Anesthesia Transfer of Care Note  Patient: [name]  Procedure(s) Performed: CYSTOSCOPY WITH ANTEGRADE PYELOGRAM,  STENT REMOVAL (Left: Renal)  Patient Location: PACU  Anesthesia Type:General  Level of Consciousness: awake, alert  and oriented  Airway & Oxygen Therapy: Patient Spontanous Breathing  Post-op Assessment: Report given to RN and Post -op Vital signs reviewed and stable  Post vital signs: Reviewed and stable  Last Vitals:  Vitals Value Taken Time  BP 134/62 03/09/22 1435  Temp    Pulse 72 03/09/22 1435  Resp 13 03/09/22 1435  SpO2 90 % 03/09/22 1435  Vitals shown include unvalidated device data.  Last Pain:  Vitals:   03/09/22 1202  TempSrc: Oral         Complications: No notable events documented.

## 2022-03-09 NOTE — Discharge Instructions (Addendum)
-  You can get back to routine activities on Friday  -It looks like your left kidney is draining well without the stent in-leave the nephrostomy tube capped.  If you are having problems with pain in that side call us at any time and we will discuss removing the tube  -Report any fevers or worsening pain Post Anesthesia Home Care Instructions  Activity: Get plenty of rest for the remainder of the day. A responsible adult should stay with you for 24 hours following the procedure.  For the next 24 hours, DO NOT: -Drive a car -Paediatric nurse -Drink alcoholic beverages -Take any medication unless instructed by your physician -Make any legal decisions or sign important papers.  Meals: Start with liquid foods such as gelatin or soup. Progress to regular foods as tolerated. Avoid greasy, spicy, heavy foods. If nausea and/or vomiting occur, drink only clear liquids until the nausea and/or vomiting subsides. Call your physician if vomiting continues.  Special Instructions/Symptoms: Your throat may feel dry or sore from the anesthesia or the breathing tube placed in your throat during surgery. If this causes discomfort, gargle with warm salt water. The discomfort should disappear within 24 hours.

## 2022-03-09 NOTE — Op Note (Signed)
Preoperative diagnosis: Urothelial carcinoma of left ureter, pyelocalyceal system with nonfunctioning/obstructed left ureteral stent  Postoperative diagnosis: Same  Principal procedure: Cystoscopy, left double-J stent extraction, left antegrade nephrostogram, fluoroscopic interpretation (under 1 hour)  Surgeon: Aristeo Hankerson  Anesthesia: General with LMA  Complications: None  Estimated blood loss: None  Indications: 76 year old female with solitary left kidney.  She has history of urothelial carcinoma of the right ureter and bladder, status post TURBT and right nephro ureterectomy years ago.  She was found to have left hydronephrosis secondary to left ureteral tumor in 2021.  She has had endoscopic management of this ureteral tumor, most recently there was no evidence of recurrence on ureteroscopy.  She did have, however, small lesions in her left renal pelvis which with cytology revealed high-grade urothelial carcinoma.  She underwent 6 Jelmyto treatments by way of nephrostomy tube in 2022.  She started treatments again 2 weeks ago.  She has had significant pain and now it seems to be obstruction of her left ureter with the stent in.  She presents at this time for cystoscopy, possible stent exchange, left nephrostogram.  I have discussed the procedure with the patient preoperatively.  I also discussed the fact that I may, if the left kidney drains appropriately with the stent removed, leave the nephrostomy tube in and eventually restart Jelmyto treatments.  She understands this and desires to proceed.  Findings: Endoscopic examination of the bladder revealed normal urothelium.  There were no foreign bodies except for stent present at the left ureteral orifice.  Antegrade study of the left pyelocalyceal system and left ureter using Omnipaque revealed a decompressed pyelocalyceal system, no evidence of filling defects, and easy/quick passage of the contrast down her left ureter.  Mild narrowing of the  mid/distal ureter consistent with prior instrumentation.  I did not see any filling defects, however.  Description of procedure: The patient was properly identified and marked in the holding area and received preoperative IV Ancef.  She was taken to the operating room where general anesthetic was administered with the LMA.  She was placed in the dorsolithotomy position.  Genitalia and perineum were prepped, draped, proper timeout performed.  64 French cystoscope was advanced into the bladder.  Bladder was circumferentially inspected and found to be normal.  Left ureteral stent was grasped and extracted without difficulty.  There was no encrustations.  Following this, approximately 18 mL of Omnipaque was utilized to perform an antegrade nephrostogram.  This revealed normal pyelocalyceal system.  I saw no evidence of filling defects within the pyelocalyceal system, and the left ureter was normal with no filling defects.  Easy transit of contrast all the way into the bladder.  Images were saved.  Because of the lack of obstruction and the easy transit of contrast, I left the nephrostomy tube in and decided to not replace the stent.  At this point, the bladder was drained.  The patient was awakened, taken to the PACU in stable condition, having tolerated the procedure well.``

## 2022-03-09 NOTE — Interval H&P Note (Signed)
History and Physical Interval Note:  03/09/2022 1:56 PM  [name]  has presented today for surgery, with the diagnosis of LEFT UPPER TRACT TRANSITIONAL CELL CARCINOMA.  The various methods of treatment have been discussed with the patient and family. After consideration of risks, benefits and other options for treatment, the patient has consented to  Procedure(s): CYSTOSCOPY WITH RETROGRADE PYELOGRAM, POSSIBLE  URETEROSCOPY AND STENT EXCHANGE (Left) as a surgical intervention.  The patient's history has been reviewed, patient examined, no change in status, stable for surgery.  I have reviewed the patient's chart and labs.  Questions were answered to the patient's satisfaction.     Lillette Boxer Quantez Schnyder

## 2022-03-09 NOTE — Progress Notes (Signed)
Dr. Sabra Heck aware of Potassium 3.2 on ISTAT. No orders at this tim

## 2022-03-09 NOTE — Anesthesia Postprocedure Evaluation (Signed)
Anesthesia Post Note  Patient: [name]  Procedure(s) Performed: CYSTOSCOPY WITH ANTEGRADE PYELOGRAM,  STENT REMOVAL (Left: Renal)     Patient location during evaluation: PACU Anesthesia Type: General Level of consciousness: awake and alert Pain management: pain level controlled Vital Signs Assessment: post-procedure vital signs reviewed and stable Respiratory status: spontaneous breathing, nonlabored ventilation, respiratory function stable and patient connected to nasal cannula oxygen Cardiovascular status: blood pressure returned to baseline and stable Postop Assessment: no apparent nausea or vomiting Anesthetic complications: no   No notable events documented.  Last Vitals:  Vitals:   03/09/22 1517 03/09/22 1521  BP: (!) 142/56   Pulse: 63 62  Resp: 19 19  Temp: 36.7 C   SpO2: 97% 98%    Last Pain:  Vitals:   03/09/22 1521  TempSrc:   PainSc: 0-No pain                 Belenda Cruise P Ramon Brant

## 2022-03-09 NOTE — Anesthesia Preprocedure Evaluation (Signed)
Anesthesia Evaluation  Patient identified by MRN, date of birth, ID band Patient awake    Reviewed: Allergy & Precautions, NPO status , Patient's Chart, lab work & pertinent test results  Airway Mallampati: II  TM Distance: >3 FB Neck ROM: Full    Dental no notable dental hx.    Pulmonary neg pulmonary ROS, former smoker,    Pulmonary exam normal        Cardiovascular hypertension, Pt. on medications and Pt. on home beta blockers + CAD and +CHF   Rhythm:Regular Rate:Normal     Neuro/Psych Anxiety Depression negative neurological ROS     GI/Hepatic Neg liver ROS, GERD  ,  Endo/Other  negative endocrine ROS  Renal/GU CRFRenal disease  negative genitourinary   Musculoskeletal negative musculoskeletal ROS (+)   Abdominal Normal abdominal exam  (+)   Peds  Hematology negative hematology ROS (+)   Anesthesia Other Findings   Reproductive/Obstetrics                             Anesthesia Physical Anesthesia Plan  ASA: 3  Anesthesia Plan: General   Post-op Pain Management:    Induction: Intravenous  PONV Risk Score and Plan: 3 and Ondansetron, Dexamethasone and Treatment may vary due to age or medical condition  Airway Management Planned: Mask and LMA  Additional Equipment: None  Intra-op Plan:   Post-operative Plan: Extubation in OR  Informed Consent: I have reviewed the patients History and Physical, chart, labs and discussed the procedure including the risks, benefits and alternatives for the proposed anesthesia with the patient or authorized representative who has indicated his/her understanding and acceptance.     Dental advisory given  Plan Discussed with: CRNA  Anesthesia Plan Comments: (Lab Results      Component                Value               Date                      WBC                      13.1 (H)            03/02/2022                HGB                       11.9 (L)            03/09/2022                HCT                      35.0 (L)            03/09/2022                MCV                      93.1                03/02/2022                PLT                      241  03/02/2022           Lab Results      Component                Value               Date                      NA                       139                 03/09/2022                K                        3.2 (L)             03/09/2022                CO2                      29                  03/02/2022                GLUCOSE                  104 (H)             03/09/2022                BUN                      11                  03/09/2022                CREATININE               1.30 (H)            03/09/2022                CALCIUM                  9.0                 03/02/2022                EGFR                     48 (L)              08/10/2021                GFRNONAA                 30 (L)              03/02/2022          )        Anesthesia Quick Evaluation

## 2022-03-09 NOTE — OR Nursing (Signed)
Left ureteral stent removed in [facility] OR #2 by Dr. Diona Fanti.

## 2022-03-09 NOTE — H&P (Signed)
H&P  Chief Complaint: Left ureteral/renal pelvic cancer, occluded stent  History of Present Illness: [name] is a 76 y.o. year old female who presents at this time for cystoscopy, stent removal on the left, antegrade nephrostogram and possible stent replacement.  She has a longstanding history of urothelial carcinoma.  She is status post right nephro ureterectomy years ago.  There was a long interval between her curative procedure and recurrence of the urothelial carcinoma in her left ureter which was approximately 2 years ago.  More recently, she had no evidence of recurrence in her ureter but on cytology washings of the left renal pelvis/calyceal system were positive for high-grade urothelial carcinoma although only very small lesions could be seen.  She underwent 6 instillations by way of nephrostomy tube of Jelmyto in 2022.  She recently restarted these 2 weeks ago.  Last week, she had significant pain with the treatment, and after the nephrostomy tube was clamped (she has an indwelling stent), she had significant pain with no production of urine.  Nephrostomy tube was uncapped, and since then most of her urine was coming out through her nephrostomy tube, very little from her bladder.  She presents at this time for cystoscopy, removal of her left ureteral stent, nephrostogram and, if no significant antegrade passage of the contrast, replacement of her double-J stent.  Past Medical History:  Diagnosis Date   Anxiety    CAD in native artery    a. cath 10/2019- mild to moderate dx >> medical therapy    Cancer ([place])    left ureteral cancer dx sept 2020   CHF (congestive heart failure) ([place])    Chronic kidney disease 06/2019   acute renal insufficiency sees dr Dorina Hoyer, ckd stage 3 per pt   COVID 09/2020   tired sore throat nausea hair fell out loss of taste and smell x 10 days, all symptoms resolved except still has loss of taste and smell   Depression    GERD (gastroesophageal  reflux disease)    Hyperlipidemia    Hypertension     Past Surgical History:  Procedure Laterality Date   ABDOMINAL HYSTERECTOMY     partial   bladder-partial removal     CYSTOSCOPY W/ URETERAL STENT PLACEMENT Left 11/17/2019   Procedure: CYSTOSCOPY WITH STENT REPLACEMENT;  Surgeon: Franchot Gallo, MD;  Location: [facility];  Service: Urology;  Laterality: Left;   CYSTOSCOPY W/ URETERAL STENT REMOVAL Left 05/10/2020   Procedure: CYSTOSCOPY WITH STENT REMOVAL;  Surgeon: Franchot Gallo, MD;  Location: [facility];  Service: Urology;  Laterality: Left;   CYSTOSCOPY W/ URETERAL STENT REMOVAL Left 05/02/2021   Procedure: CYSTOSCOPY WITH STENT REMOVAL;  Surgeon: Franchot Gallo, MD;  Location: [facility];  Service: Urology;  Laterality: Left;   CYSTOSCOPY WITH RETROGRADE PYELOGRAM, URETEROSCOPY AND STENT PLACEMENT Left 06/27/2019   Procedure: CYSTOSCOPY WITH LEFT RETROGRADE PYELOGRAM, URETEROSCOPY, BIOPSY AND LEFT STENT PLACEMENT;  Surgeon: Irine Seal, MD;  Location: [facility];  Service: Urology;  Laterality: Left;   CYSTOSCOPY WITH RETROGRADE PYELOGRAM, URETEROSCOPY AND STENT PLACEMENT Left 08/18/2019   Procedure: CYSTOSCOPY, URETEROSCOPY AND STENT EXCHANGE;  Surgeon: Franchot Gallo, MD;  Location: [facility];  Service: Urology;  Laterality: Left;  72 MINS   CYSTOSCOPY WITH RETROGRADE PYELOGRAM, URETEROSCOPY AND STENT PLACEMENT Left 11/17/2019   Procedure: CYSTOSCOPY WITH RETROGRADE PYELOGRAM, URETEROSCOPY AND STENT PLACEMENT;  Surgeon: Franchot Gallo, MD;  Location: [facility];  Service: Urology;  Laterality: Left;  90 MINS  CYSTOSCOPY WITH RETROGRADE PYELOGRAM, URETEROSCOPY AND STENT PLACEMENT Left 05/10/2020   Procedure: CYSTOSCOPY WITH RETROGRADE PYELOGRAM, URETEROSCOPY AND STENT PLACEMENT WITH URETHRAL DIALATION AND BRUSH BIOPSY;  Surgeon: Franchot Gallo, MD;  Location: [facility];  Service: Urology;   Laterality: Left;  1 HR   CYSTOSCOPY WITH RETROGRADE PYELOGRAM, URETEROSCOPY AND STENT PLACEMENT Left 09/16/2020   Procedure: CYSTOSCOPY WITH RETROGRADE PYELOGRAM, URETEROSCOPY ,  LITHOPAXY, LEFT URETERAL BRUSHING, AND STENT REPLACEMENT;  Surgeon: Franchot Gallo, MD;  Location: [facility];  Service: Urology;  Laterality: Left;   CYSTOSCOPY WITH RETROGRADE PYELOGRAM, URETEROSCOPY AND STENT PLACEMENT Left 10/24/2021   Procedure: CYSTOSCOPY WITH RETROGRADE PYELOGRAM, URETEROSCOPY, POSSIBLE URETERAL AND RENAL BIOPSIES AND STENT PLACEMENT;  Surgeon: Franchot Gallo, MD;  Location: [facility];  Service: Urology;  Laterality: Left;  1 HR   CYSTOSCOPY/URETEROSCOPY/HOLMIUM LASER/STENT PLACEMENT Left 05/02/2021   Procedure: CYSTOSCOPY/URETEROSCOPY WITH BRUSH BIOPSY/ RETROGRADE PYELOGRAM/ HOLMIUM LASER/STENT REPLACEMENT;  Surgeon: Franchot Gallo, MD;  Location: [facility];  Service: Urology;  Laterality: Left;   HOLMIUM LASER APPLICATION Left 05/14/7618   Procedure: HOLMIUM LASER APPLICATION;  Surgeon: Franchot Gallo, MD;  Location: [facility];  Service: Urology;  Laterality: Left;   HOLMIUM LASER APPLICATION Left 02/14/3266   Procedure: HOLMIUM LASER APPLICATION OF TUMORS;  Surgeon: Franchot Gallo, MD;  Location: [facility];  Service: Urology;  Laterality: Left;   IR NEPHROSTOMY PLACEMENT LEFT  07/06/2021   IR NEPHROSTOMY PLACEMENT LEFT  02/06/2022   kidney removed  2006   right    RIGHT/LEFT HEART CATH AND CORONARY ANGIOGRAPHY N/A 11/06/2019   Procedure: RIGHT/LEFT HEART CATH AND CORONARY ANGIOGRAPHY;  Surgeon: Belva Crome, MD;  Location: [facility];  Service: Cardiovascular;  Laterality: N/A;   THULIUM LASER TURP (TRANSURETHRAL RESECTION OF PROSTATE) Left 08/18/2019   Procedure: THULIUM LASER ABLATION OF URETERAL TUMOR;  Surgeon: Franchot Gallo, MD;  Location: [facility];  Service: Urology;  Laterality:  Left;   THULIUM LASER TURP (TRANSURETHRAL RESECTION OF PROSTATE) Left 11/17/2019   Procedure: THULIUM LASER of URETERAL CANCER;  Surgeon: Franchot Gallo, MD;  Location: [facility];  Service: Urology;  Laterality: Left;    Home Medications:  Medications Prior to Admission  Medication Sig Dispense Refill   acetaminophen (TYLENOL) 500 MG tablet Take 500 mg by mouth every 6 (six) hours as needed for moderate pain.     aspirin EC 81 MG tablet Take 1 tablet (81 mg total) by mouth daily.     calcium carbonate (TUMS - DOSED IN MG ELEMENTAL CALCIUM) 500 MG chewable tablet Chew 1 tablet by mouth every morning.     carvedilol (COREG) 6.25 MG tablet TAKE 1 TABLET(6.25 MG) BY MOUTH TWICE DAILY WITH A MEAL 180 tablet 2   cholecalciferol (VITAMIN D3) 25 MCG (1000 UNIT) tablet Take 1,000 Units by mouth daily.     ezetimibe (ZETIA) 10 MG tablet Take 1 tablet (10 mg total) by mouth daily. 90 tablet 3   hydrALAZINE (APRESOLINE) 50 MG tablet TAKE 1 TABLET(50 MG) BY MOUTH EVERY 8 HOURS 270 tablet 3   isosorbide mononitrate (IMDUR) 30 MG 24 hr tablet Take 0.5 tablets (15 mg total) by mouth daily. 45 tablet 3   oxyCODONE (ROXICODONE) 5 MG immediate release tablet Take 1 tablet (5 mg total) by mouth every 6 (six) hours as needed for severe pain. 20 tablet 0   rosuvastatin (CRESTOR) 40 MG tablet Take 1 tablet (40 mg total) by mouth daily. [place]  tablet 3   sertraline (ZOLOFT) 50 MG tablet Take 50 mg by mouth at bedtime.     traZODone (DESYREL) 150 MG tablet Take 150 mg by mouth at bedtime.     oxybutynin (DITROPAN) 5 MG tablet Take 5 mg by mouth as needed.      Allergies:  Allergies  Allergen Reactions   Erythromycin Diarrhea and Nausea And Vomiting   Lotensin [Benazepril Hcl] Cough    Family History  Problem Relation Age of Onset   Hypertension Mother    Diabetes Mellitus II Sister    Hypertension Sister    Hypertension Brother     Social History:  reports that she quit smoking about 18  years ago. Her smoking use included cigarettes. She has a 19.00 pack-year smoking history. She has never used smokeless tobacco. She reports that she does not drink alcohol and does not use drugs.  ROS: A complete review of systems was performed.  All systems are negative except for pertinent findings as noted.  Physical Exam:  Vital signs in last 24 hours: Temp:  [97 F (36.1 C)] 97 F (36.1 C) (06/01 1202) Pulse Rate:  [67] 67 (06/01 1202) Resp:  [18] 18 (06/01 1202) BP: (127)/(58) 127/58 (06/01 1202) SpO2:  [96 %] 96 % (06/01 1202) Weight:  [79.4 kg] 79.4 kg (06/01 1202) General:  Alert and oriented, No acute distress HEENT: Normocephalic, atraumatic Neck: No JVD or lymphadenopathy Cardiovascular: Regular rate  Lungs: Normal inspiratory/expiratory excursion Abdomen: Soft, nontender, nondistended, no abdominal masses Back: No CVA tenderness Extremities: No edema Neurologic: Grossly intact  I have reviewed prior pt notes  I have reviewed notes from referring/previous physicians  I have reviewed urinalysis results   Impression/Assessment:  Urothelial carcinoma of the left ureter/pyelocalyceal system, with probable obstruction of her left double-J stent  Plan:  Cystoscopy, extraction of her left double-J stent, antegrade nephrostogram, possible replacement of left double-J stent.  Lillette Boxer Haadiya Frogge 03/09/2022, 1:40 PM  Lillette Boxer. Danasha Melman MD

## 2022-03-10 ENCOUNTER — Encounter (HOSPITAL_BASED_OUTPATIENT_CLINIC_OR_DEPARTMENT_OTHER): Payer: Self-pay | Admitting: Urology

## 2022-03-11 ENCOUNTER — Other Ambulatory Visit: Payer: Self-pay | Admitting: Urology

## 2022-03-11 ENCOUNTER — Emergency Department (HOSPITAL_COMMUNITY)
Admission: EM | Admit: 2022-03-11 | Discharge: 2022-03-11 | Disposition: A | Discharge status: Home / Self Care | Payer: Medicare PPO | Attending: Emergency Medicine | Admitting: Emergency Medicine

## 2022-03-11 ENCOUNTER — Other Ambulatory Visit (HOSPITAL_COMMUNITY): Payer: Self-pay | Admitting: Urology

## 2022-03-11 ENCOUNTER — Encounter (HOSPITAL_COMMUNITY): Payer: Self-pay

## 2022-03-11 ENCOUNTER — Emergency Department (HOSPITAL_COMMUNITY): Payer: Medicare PPO

## 2022-03-11 DIAGNOSIS — R1031 Right lower quadrant pain: Secondary | ICD-10-CM | POA: Diagnosis not present

## 2022-03-11 DIAGNOSIS — C662 Malignant neoplasm of left ureter: Secondary | ICD-10-CM

## 2022-03-11 DIAGNOSIS — M7989 Other specified soft tissue disorders: Secondary | ICD-10-CM | POA: Insufficient documentation

## 2022-03-11 DIAGNOSIS — R109 Unspecified abdominal pain: Secondary | ICD-10-CM | POA: Insufficient documentation

## 2022-03-11 DIAGNOSIS — R944 Abnormal results of kidney function studies: Secondary | ICD-10-CM | POA: Diagnosis not present

## 2022-03-11 DIAGNOSIS — Z8551 Personal history of malignant neoplasm of bladder: Secondary | ICD-10-CM | POA: Diagnosis not present

## 2022-03-11 DIAGNOSIS — Z7982 Long term (current) use of aspirin: Secondary | ICD-10-CM | POA: Diagnosis not present

## 2022-03-11 DIAGNOSIS — E876 Hypokalemia: Secondary | ICD-10-CM | POA: Diagnosis not present

## 2022-03-11 DIAGNOSIS — D649 Anemia, unspecified: Secondary | ICD-10-CM | POA: Diagnosis not present

## 2022-03-11 DIAGNOSIS — Z79899 Other long term (current) drug therapy: Secondary | ICD-10-CM | POA: Diagnosis not present

## 2022-03-11 DIAGNOSIS — N134 Hydroureter: Secondary | ICD-10-CM | POA: Diagnosis not present

## 2022-03-11 DIAGNOSIS — R8289 Other abnormal findings on cytological and histological examination of urine: Secondary | ICD-10-CM | POA: Insufficient documentation

## 2022-03-11 DIAGNOSIS — R1032 Left lower quadrant pain: Secondary | ICD-10-CM | POA: Diagnosis not present

## 2022-03-11 HISTORY — DX: Other specified soft tissue disorders: M79.89

## 2022-03-11 LAB — CBC WITH DIFFERENTIAL/PLATELET
Abs Immature Granulocytes: 0.01 10*3/uL (ref 0.00–0.07)
Basophils Absolute: 0.1 10*3/uL (ref 0.0–0.1)
Basophils Relative: 2 %
Eosinophils Absolute: 0.2 10*3/uL (ref 0.0–0.5)
Eosinophils Relative: 3 %
HCT: 33.3 % — ABNORMAL LOW (ref 36.0–46.0)
Hemoglobin: 10.6 g/dL — ABNORMAL LOW (ref 12.0–15.0)
Immature Granulocytes: 0 %
Lymphocytes Relative: 33 %
Lymphs Abs: 2.3 10*3/uL (ref 0.7–4.0)
MCH: 29.9 pg (ref 26.0–34.0)
MCHC: 31.8 g/dL (ref 30.0–36.0)
MCV: 93.8 fL (ref 80.0–100.0)
Monocytes Absolute: 0.7 10*3/uL (ref 0.1–1.0)
Monocytes Relative: 10 %
Neutro Abs: 3.6 10*3/uL (ref 1.7–7.7)
Neutrophils Relative %: 52 %
Platelets: 157 10*3/uL (ref 150–400)
RBC: 3.55 MIL/uL — ABNORMAL LOW (ref 3.87–5.11)
RDW: 14.3 % (ref 11.5–15.5)
WBC: 6.9 10*3/uL (ref 4.0–10.5)
nRBC: 0 % (ref 0.0–0.2)

## 2022-03-11 LAB — URINALYSIS, ROUTINE W REFLEX MICROSCOPIC
Bacteria, UA: NONE SEEN
Bilirubin Urine: NEGATIVE
Glucose, UA: NEGATIVE mg/dL
Ketones, ur: NEGATIVE mg/dL
Nitrite: NEGATIVE
Protein, ur: 100 mg/dL — AB
RBC / HPF: >50 RBC/hpf — ABNORMAL HIGH (ref 0–5)
Specific Gravity, Urine: 1.011 (ref 1.005–1.030)
pH: 6 (ref 5.0–8.0)

## 2022-03-11 LAB — COMPREHENSIVE METABOLIC PANEL
ALT: 19 U/L (ref 0–44)
AST: 25 U/L (ref 15–41)
Albumin: 3.1 g/dL — ABNORMAL LOW (ref 3.5–5.0)
Alkaline Phosphatase: 36 U/L — ABNORMAL LOW (ref 38–126)
Anion gap: 8 (ref 5–15)
BUN: 15 mg/dL (ref 8–23)
CO2: 28 mmol/L (ref 22–32)
Calcium: 8.8 mg/dL — ABNORMAL LOW (ref 8.9–10.3)
Chloride: 103 mmol/L (ref 98–111)
Creatinine, Ser: 1.41 mg/dL — ABNORMAL HIGH (ref 0.44–1.00)
GFR, Estimated: 39 mL/min — ABNORMAL LOW (ref >60)
Glucose, Bld: 95 mg/dL (ref 70–99)
Potassium: 3.2 mmol/L — ABNORMAL LOW (ref 3.5–5.1)
Sodium: 139 mmol/L (ref 135–145)
Total Bilirubin: 0.5 mg/dL (ref 0.3–1.2)
Total Protein: 6.4 g/dL — ABNORMAL LOW (ref 6.5–8.1)

## 2022-03-11 MED ORDER — POTASSIUM CHLORIDE CRYS ER 20 MEQ PO TBCR
40.0000 meq | EXTENDED_RELEASE_TABLET | Freq: Once | ORAL | Status: AC
  Administered 2022-03-11: 40 meq via ORAL
  Filled 2022-03-11: qty 2

## 2022-03-11 NOTE — ED Provider Notes (Signed)
[facility] DEPT Provider Note   CSN: [id number] Arrival date & time: 03/11/22  1409     History Chief Complaint  Patient presents with   Foot Swelling   Post-op Problem    [name] is a 76 y.o. female with history of bladder cancer with subsequent right total nephroureterectomy many years ago who presents to the emergency department today with increased left-sided flank pain and bilateral feet swelling.  Patient had a cystoscopy done 2 days ago for a stent removal in the left ureter and kidney.  Patient does have a nephrostomy tube in place on the left.  HPI     Home Medications Prior to Admission medications   Medication Sig Start Date End Date Taking? Authorizing Provider  acetaminophen (TYLENOL) 500 MG tablet Take 500 mg by mouth every 6 (six) hours as needed for moderate pain.   Yes [provider]  aspirin EC 81 MG tablet Take 1 tablet (81 mg total) by mouth daily. 11/03/19  Yes Nahser, Wonda Cheng, MD  calcium carbonate (TUMS - DOSED IN MG ELEMENTAL CALCIUM) 500 MG chewable tablet Chew 1 tablet by mouth every morning.   Yes [provider]  carvedilol (COREG) 6.25 MG tablet TAKE 1 TABLET(6.25 MG) BY MOUTH TWICE DAILY WITH A MEAL Patient taking differently: Take 6.25 mg by mouth 2 (two) times daily with a meal. 11/22/21  Yes Chandrasekhar, Mahesh A, MD  cephALEXin (KEFLEX) 500 MG capsule Take 1 capsule (500 mg total) by mouth 2 (two) times daily for 6 doses. 03/09/22 03/12/22 Yes Dahlstedt, Annie Main, MD  cholecalciferol (VITAMIN D3) 25 MCG (1000 UNIT) tablet Take 1,000 Units by mouth daily.   Yes [provider]  doxycycline (VIBRA-TABS) 100 MG tablet Take 100 mg by mouth daily. Per patient - no stop date provided by the physician 02/08/22  Yes [provider]  hydrALAZINE (APRESOLINE) 50 MG tablet TAKE 1 TABLET(50 MG) BY MOUTH EVERY 8 HOURS Patient taking differently: Take 50 mg by mouth 3 (three) times daily.  02/13/22  Yes Chandrasekhar, Mahesh A, MD  HYDROcodone-acetaminophen (NORCO/VICODIN) 5-325 MG tablet Take 1 tablet by mouth every 6 (six) hours as needed for pain. 02/24/22  Yes [provider]  isosorbide mononitrate (IMDUR) 30 MG 24 hr tablet Take 0.5 tablets (15 mg total) by mouth daily. 12/13/21  Yes Chandrasekhar, Mahesh A, MD  oxyCODONE (ROXICODONE) 5 MG immediate release tablet Take 1 tablet (5 mg total) by mouth every 6 (six) hours as needed for severe pain. 02/06/22  Yes Allred, Darrell K, PA-C  rosuvastatin (CRESTOR) 40 MG tablet Take 1 tablet (40 mg total) by mouth daily. 12/02/21  Yes Chandrasekhar, Mahesh A, MD  sertraline (ZOLOFT) 50 MG tablet Take 50 mg by mouth daily.   Yes [provider]  traZODone (DESYREL) 150 MG tablet Take 150 mg by mouth at bedtime. 08/24/20  Yes [provider]  ezetimibe (ZETIA) 10 MG tablet Take 1 tablet (10 mg total) by mouth daily. Patient not taking: Reported on 03/11/2022 12/21/21   Rudean Haskell A, MD  oxybutynin (DITROPAN) 5 MG tablet Take 5 mg by mouth as needed for bladder spasms. 12/13/21   [provider]      Allergies    Erythromycin and Lotensin [benazepril hcl]    Review of Systems   Review of Systems  Physical Exam Updated Vital Signs BP (!) 147/69 (BP Location: Right Arm)   Pulse (!) 57   Temp 97.8 F (36.6 C) (Oral)  Resp 18   SpO2 95%  Physical Exam Vitals and nursing note reviewed.  Constitutional:      General: She is not in acute distress.    Appearance: Normal appearance.  HENT:     Head: Normocephalic and atraumatic.  Eyes:     General:        Right eye: No discharge.        Left eye: No discharge.  Cardiovascular:     Comments: Regular rate and rhythm.  S1/S2 are distinct without any evidence of murmur, rubs, or gallops.  Radial pulses are 2+ bilaterally.  Dorsalis pedis pulses are 2+ bilaterally.  No evidence of pedal edema. Pulmonary:     Comments: Clear to auscultation  bilaterally.  Normal effort.  No respiratory distress.  No evidence of wheezes, rales, or rhonchi heard throughout. Abdominal:     General: Abdomen is flat. Bowel sounds are normal. There is no distension.     Tenderness: There is no abdominal tenderness. There is no guarding or rebound.  Musculoskeletal:        General: Normal range of motion.     Cervical back: Neck supple.     Comments: Nephrostomy tube in place over the left side of the back.  Skin:    General: Skin is warm and dry.     Findings: No rash.  Neurological:     General: No focal deficit present.     Mental Status: She is alert.  Psychiatric:        Mood and Affect: Mood normal.        Behavior: Behavior normal.    ED Results / Procedures / Treatments   Labs (all labs ordered are listed, but only abnormal results are displayed) Labs Reviewed  CBC WITH DIFFERENTIAL/PLATELET - Abnormal; Notable for the following components:      Result Value   RBC 3.55 (*)    Hemoglobin 10.6 (*)    HCT 33.3 (*)    All other components within normal limits  COMPREHENSIVE METABOLIC PANEL - Abnormal; Notable for the following components:   Potassium 3.2 (*)    Creatinine, Ser 1.41 (*)    Calcium 8.8 (*)    Total Protein 6.4 (*)    Albumin 3.1 (*)    Alkaline Phosphatase 36 (*)    GFR, Estimated 39 (*)    All other components within normal limits  URINALYSIS, ROUTINE W REFLEX MICROSCOPIC - Abnormal; Notable for the following components:   Hgb urine dipstick MODERATE (*)    Protein, ur 100 (*)    Leukocytes,Ua SMALL (*)    RBC / HPF >50 (*)    All other components within normal limits    EKG None  Radiology CT ABDOMEN PELVIS WO CONTRAST  Result Date: 03/11/2022 CLINICAL DATA:  Nephrostomy catheter displacement left flank pain with nephrostomy tube. Pt presents with c/o bilateral feet swelling. Pt recently had surgery on her kidney on Thursday and reports the swelling has been present since then. Pt has a left nephrostomy.  Pt reports that urine is not draining as well. EXAM: CT ABDOMEN AND PELVIS WITHOUT CONTRAST TECHNIQUE: Multidetector CT imaging of the abdomen and pelvis was performed following the standard protocol without IV contrast. RADIATION DOSE REDUCTION: This exam was performed according to the departmental dose-optimization program which includes automated exposure control, adjustment of the mA and/or kV according to patient size and/or use of iterative reconstruction technique. COMPARISON:  None Available. FINDINGS: Lower chest: Aortic valve leaflet and coronary artery  calcification. Aortic calcification. Hepatobiliary: No focal liver abnormality. No gallstones, gallbladder wall thickening, or pericholecystic fluid. No biliary dilatation. Pancreas: No focal lesion. Normal pancreatic contour. No surrounding inflammatory changes. No main pancreatic ductal dilatation. Spleen: Normal in size without focal abnormality. Adrenals/Urinary Tract: Status post right nephrectomy. Left percutaneous nephrostomy tube with pigtail terminating within the renal pelvis. Mild left hydroureter. Peri ureteral fat stranding along the left ureter. No nephrolithiasis and no hydronephrosis. No definite contour-deforming renal mass. No ureterolithiasis or hydroureter. The urinary bladder is unremarkable. Stomach/Bowel: Stomach is within normal limits. No evidence of bowel wall thickening or dilatation. Scattered colonic diverticulosis. Appendix appears normal. Vascular/Lymphatic: No abdominal aorta or iliac aneurysm. Severe atherosclerotic plaque of the aorta and its branches. No abdominal, pelvic, or inguinal lymphadenopathy. Reproductive: Status post hysterectomy. No adnexal masses. Other: No intraperitoneal free fluid. No intraperitoneal free gas. No organized fluid collection. Musculoskeletal: No abdominal wall hernia or abnormality. No suspicious lytic or blastic osseous lesions. No acute displaced fracture. Multilevel degenerative changes of  the spine. IMPRESSION: 1. Mild left hydroureter with periureteral fat stranding concerning for infection. Recommend correlation with urinalysis. Percutaneous nephrostomy tube in appropriate position with no hydronephrosis. 2. Right nephrectomy. 3. Colonic diverticulosis with no acute diverticulitis. 4. Aortic Atherosclerosis (ICD10-I70.0). Electronically Signed   By: Iven Finn M.D.   On: 03/11/2022 15:57    Procedures Procedures    Medications Ordered in ED Medications  potassium chloride SA (KLOR-CON M) CR tablet 40 mEq (40 mEq Oral Given 03/11/22 1651)    ED Course/ Medical Decision Making/ A&P Clinical Course as of 03/11/22 1737  Sat Mar 11, 2022  1556 I spoke with Dr. Diona Fanti with urology who recommended just getting basic labs to evaluate kidney function for possible AKI.  He is not concerned at this time for any kind of obstruction as she had normal flow from the nephrostomy tube and had a negative urine culture in the office. [CF]  0093 I spoke Dr. Diona Fanti with urology and reviewed the labs with him.  He will follow-up with her tomorrow in the office early next week.  He states that she will be suitable for discharge. [CF]  1732 Urinalysis, Routine w reflex microscopic Urine, Unspecified Source(!) Urinalysis shows a lot of red blood cells and white blood cells.  Patient had a negative culture 2 days ago. [CF]  1732 Comprehensive metabolic panel(!) Some hypokalemia and elevated creatinine but seems to be at the patient's baseline.  We will give her potassium here in the department. [CF]  8182 CBC with Differential(!) Mild anemia without evidence of leukocytosis. [CF]    Clinical Course User Index [CF] Hendricks Limes, PA-C                           Medical Decision Making XANA BRADT is a 76 y.o. female who presents the emergency room with bilateral feet swelling and increased pain in the left flank where her nephrostomy tube is.  I have a low suspicion for DVT or PE  as patient is not having any shortness of breath or chest pain nor she have any significant calf tenderness bilaterally.  Low suspicion for CHF at this point.  I spoke with her urologist who recommended assessing renal function and to call him back.  Given that she is 2 days postop I will also get a CT scan to evaluate.   Amount and/or Complexity of Data Reviewed External Data Reviewed: notes.  Details: I reviewed the op note from 03/09/2022 where she had a cystoscopy and stent removal. Labs: ordered. Decision-making details documented in ED Course. Radiology: ordered and independent interpretation performed.    Details: I personally ordered and interpreted CT abdomen pelvis which showed a right total nephrectomy.  I do agree with the radiologist dictation regarding the left kidney.  Risk Prescription drug management. Parenteral controlled substances. Risk Details: Given the findings of the labs and imaging I do feel that the patient is safe to go home.  I will defer treating her urine today concerned that she had a negative culture in the office 2 days ago.  Nor she having any urinary symptoms.  Her urologist will touch base with her tomorrow.  She was encouraged to return to the emergency room for any worsening symptoms.  She is safe for discharge at this time.   Final Clinical Impression(s) / ED Diagnoses Final diagnoses:  Foot swelling  Flank pain    Rx / DC Orders ED Discharge Orders     None         Hendricks Limes, [place] 03/11/22 1737    Gareth Morgan, MD 03/12/22 [phone]

## 2022-03-11 NOTE — Discharge Instructions (Signed)
Please follow-up with Dr. Diona Fanti next week.  Return to the emergency department for any worsening symptoms you might have.

## 2022-03-11 NOTE — ED Triage Notes (Signed)
Pt presents with c/o bilateral feet swelling. Pt recently had surgery on her kidney on Thursday and reports the swelling has been present since then. Pt has a left nephrostomy. Pt reports that urine is not draining as well.

## 2022-03-14 DIAGNOSIS — C662 Malignant neoplasm of left ureter: Secondary | ICD-10-CM | POA: Diagnosis not present

## 2022-03-14 DIAGNOSIS — R8279 Other abnormal findings on microbiological examination of urine: Secondary | ICD-10-CM | POA: Diagnosis not present

## 2022-03-14 DIAGNOSIS — C652 Malignant neoplasm of left renal pelvis: Secondary | ICD-10-CM | POA: Diagnosis not present

## 2022-03-15 DIAGNOSIS — C662 Malignant neoplasm of left ureter: Secondary | ICD-10-CM | POA: Diagnosis not present

## 2022-03-15 DIAGNOSIS — C652 Malignant neoplasm of left renal pelvis: Secondary | ICD-10-CM | POA: Diagnosis not present

## 2022-03-16 ENCOUNTER — Encounter (HOSPITAL_COMMUNITY): Payer: Medicare PPO

## 2022-03-16 ENCOUNTER — Encounter (HOSPITAL_COMMUNITY): Payer: Self-pay

## 2022-03-17 DIAGNOSIS — C662 Malignant neoplasm of left ureter: Secondary | ICD-10-CM | POA: Diagnosis not present

## 2022-03-17 DIAGNOSIS — C652 Malignant neoplasm of left renal pelvis: Secondary | ICD-10-CM | POA: Diagnosis not present

## 2022-03-20 ENCOUNTER — Other Ambulatory Visit: Payer: Self-pay | Admitting: Urology

## 2022-03-20 ENCOUNTER — Other Ambulatory Visit: Payer: Self-pay

## 2022-03-20 ENCOUNTER — Encounter (HOSPITAL_BASED_OUTPATIENT_CLINIC_OR_DEPARTMENT_OTHER): Payer: Self-pay | Admitting: Urology

## 2022-03-20 DIAGNOSIS — C662 Malignant neoplasm of left ureter: Secondary | ICD-10-CM | POA: Diagnosis not present

## 2022-03-20 DIAGNOSIS — C652 Malignant neoplasm of left renal pelvis: Secondary | ICD-10-CM | POA: Diagnosis not present

## 2022-03-20 DIAGNOSIS — R8279 Other abnormal findings on microbiological examination of urine: Secondary | ICD-10-CM | POA: Diagnosis not present

## 2022-03-20 NOTE — Progress Notes (Addendum)
Spoke w/ via phone for pre-op interview---pt Lab needs dos----      none per anesthesia, surgery orders need 2nd sign Lab results------cbc with dif and cmet done 03-11-2022 epic COVID test -----patient states asymptomatic no test needed Arrive at ------1215 pm  03-23-2022 NPO after MN NO Solid Food.  Clear liquids from MN until-- 1115 am Med rec completed Medications to take morning of surgery -----carvedilol, hydrazaline, imdur Diabetic medication -----n/a Patient instructed no nail polish to be worn day of surgery Patient instructed to bring photo id and insurance card day of surgery Patient aware to have Driver (ride ) / caregiver  spouse [name] will drop pt off   for 24 hours after surgery  Patient Special Instructions -----none Pre-Op special Istructions -----none Patient verbalized understanding of instructions that were given at this phone interview. Patient denies shortness of breath, chest pain, fever, cough at this phone interview.   Anesthesia :  PCP:dr timberlake,  Cardiologist: dr Gaspar Bidding lov 12-21-2021 epic EKG :12-21-2021 chart/epic Echo :5-11-20233 epic lvef 70 to 75 % Stress test:none Cardiac Cath :  11-06-2019 epic  ASA / Instructions/ Last Dose :  last dose 81 mg aspirin was 03-20-2022 per pt

## 2022-03-23 ENCOUNTER — Encounter (HOSPITAL_BASED_OUTPATIENT_CLINIC_OR_DEPARTMENT_OTHER)
Admission: RE | Disposition: A | Discharge status: Home / Self Care | Payer: Self-pay | Source: Home / Self Care | Attending: Urology

## 2022-03-23 ENCOUNTER — Ambulatory Visit (HOSPITAL_BASED_OUTPATIENT_CLINIC_OR_DEPARTMENT_OTHER)
Admission: RE | Admit: 2022-03-23 | Discharge: 2022-03-23 | Disposition: A | Discharge status: Home / Self Care | Payer: Medicare PPO | Attending: Urology | Admitting: Urology

## 2022-03-23 ENCOUNTER — Ambulatory Visit (HOSPITAL_BASED_OUTPATIENT_CLINIC_OR_DEPARTMENT_OTHER): Payer: Medicare PPO | Admitting: Certified Registered Nurse Anesthetist

## 2022-03-23 ENCOUNTER — Other Ambulatory Visit: Payer: Self-pay

## 2022-03-23 ENCOUNTER — Encounter (HOSPITAL_BASED_OUTPATIENT_CLINIC_OR_DEPARTMENT_OTHER): Payer: Self-pay | Admitting: Urology

## 2022-03-23 DIAGNOSIS — I130 Hypertensive heart and chronic kidney disease with heart failure and stage 1 through stage 4 chronic kidney disease, or unspecified chronic kidney disease: Secondary | ICD-10-CM | POA: Insufficient documentation

## 2022-03-23 DIAGNOSIS — Z8553 Personal history of malignant neoplasm of renal pelvis: Secondary | ICD-10-CM

## 2022-03-23 DIAGNOSIS — I110 Hypertensive heart disease with heart failure: Secondary | ICD-10-CM | POA: Diagnosis not present

## 2022-03-23 DIAGNOSIS — C679 Malignant neoplasm of bladder, unspecified: Secondary | ICD-10-CM | POA: Insufficient documentation

## 2022-03-23 DIAGNOSIS — I509 Heart failure, unspecified: Secondary | ICD-10-CM | POA: Insufficient documentation

## 2022-03-23 DIAGNOSIS — I2510 Atherosclerotic heart disease of native coronary artery without angina pectoris: Secondary | ICD-10-CM | POA: Diagnosis not present

## 2022-03-23 DIAGNOSIS — C662 Malignant neoplasm of left ureter: Secondary | ICD-10-CM | POA: Insufficient documentation

## 2022-03-23 DIAGNOSIS — Z8554 Personal history of malignant neoplasm of ureter: Secondary | ICD-10-CM

## 2022-03-23 DIAGNOSIS — Z936 Other artificial openings of urinary tract status: Secondary | ICD-10-CM | POA: Diagnosis not present

## 2022-03-23 DIAGNOSIS — N189 Chronic kidney disease, unspecified: Secondary | ICD-10-CM | POA: Insufficient documentation

## 2022-03-23 DIAGNOSIS — K219 Gastro-esophageal reflux disease without esophagitis: Secondary | ICD-10-CM | POA: Diagnosis not present

## 2022-03-23 DIAGNOSIS — C659 Malignant neoplasm of unspecified renal pelvis: Secondary | ICD-10-CM | POA: Diagnosis not present

## 2022-03-23 DIAGNOSIS — Z87891 Personal history of nicotine dependence: Secondary | ICD-10-CM | POA: Insufficient documentation

## 2022-03-23 DIAGNOSIS — C652 Malignant neoplasm of left renal pelvis: Secondary | ICD-10-CM | POA: Diagnosis not present

## 2022-03-23 DIAGNOSIS — Z01818 Encounter for other preprocedural examination: Secondary | ICD-10-CM

## 2022-03-23 HISTORY — PX: NEPHROSTOMY TUBE REMOVAL: SHX6794

## 2022-03-23 HISTORY — PX: CYSTOSCOPY W/ URETERAL STENT PLACEMENT: SHX1429

## 2022-03-23 LAB — POCT I-STAT, CHEM 8
BUN: 11 mg/dL (ref 8–23)
Calcium, Ion: 1.18 mmol/L (ref 1.15–1.40)
Chloride: 97 mmol/L — ABNORMAL LOW (ref 98–111)
Creatinine, Ser: 1.6 mg/dL — ABNORMAL HIGH (ref 0.44–1.00)
Glucose, Bld: 103 mg/dL — ABNORMAL HIGH (ref 70–99)
HCT: 32 % — ABNORMAL LOW (ref 36.0–46.0)
Hemoglobin: 10.9 g/dL — ABNORMAL LOW (ref 12.0–15.0)
Potassium: 3.6 mmol/L (ref 3.5–5.1)
Sodium: 137 mmol/L (ref 135–145)
TCO2: 28 mmol/L (ref 22–32)

## 2022-03-23 SURGERY — CYSTOSCOPY, WITH RETROGRADE PYELOGRAM AND URETERAL STENT INSERTION
Anesthesia: General | Site: Ureter | Laterality: Left

## 2022-03-23 MED ORDER — PROPOFOL 10 MG/ML IV BOLUS
INTRAVENOUS | Status: AC
  Filled 2022-03-23: qty 20

## 2022-03-23 MED ORDER — DEXAMETHASONE SODIUM PHOSPHATE 10 MG/ML IJ SOLN
INTRAMUSCULAR | Status: AC
  Filled 2022-03-23: qty 1

## 2022-03-23 MED ORDER — EPHEDRINE 5 MG/ML INJ
INTRAVENOUS | Status: AC
  Filled 2022-03-23: qty 5

## 2022-03-23 MED ORDER — ACETAMINOPHEN 500 MG PO TABS
1000.0000 mg | ORAL_TABLET | Freq: Once | ORAL | Status: AC
  Administered 2022-03-23: 1000 mg via ORAL

## 2022-03-23 MED ORDER — LACTATED RINGERS IV SOLN: INTRAVENOUS | Status: DC

## 2022-03-23 MED ORDER — ACETAMINOPHEN 500 MG PO TABS
ORAL_TABLET | ORAL | Status: AC
  Filled 2022-03-23: qty 2

## 2022-03-23 MED ORDER — PHENYLEPHRINE 80 MCG/ML (10ML) SYRINGE FOR IV PUSH (FOR BLOOD PRESSURE SUPPORT)
PREFILLED_SYRINGE | INTRAVENOUS | Status: DC | PRN
  Administered 2022-03-23: 80 ug via INTRAVENOUS
  Administered 2022-03-23: 240 ug via INTRAVENOUS

## 2022-03-23 MED ORDER — OXYCODONE HCL 5 MG PO TABS: 5.0000 mg | ORAL_TABLET | Freq: Once | ORAL | Status: DC | PRN

## 2022-03-23 MED ORDER — PHENYLEPHRINE 80 MCG/ML (10ML) SYRINGE FOR IV PUSH (FOR BLOOD PRESSURE SUPPORT)
PREFILLED_SYRINGE | INTRAVENOUS | Status: AC
  Filled 2022-03-23: qty 10

## 2022-03-23 MED ORDER — CEFAZOLIN SODIUM-DEXTROSE 2-4 GM/100ML-% IV SOLN
2.0000 g | INTRAVENOUS | Status: AC
  Administered 2022-03-23: 2 g via INTRAVENOUS

## 2022-03-23 MED ORDER — FENTANYL CITRATE (PF) 100 MCG/2ML IJ SOLN: 25.0000 ug | INTRAMUSCULAR | Status: DC | PRN

## 2022-03-23 MED ORDER — ONDANSETRON HCL 4 MG/2ML IJ SOLN
INTRAMUSCULAR | Status: AC
  Filled 2022-03-23: qty 2

## 2022-03-23 MED ORDER — OXYCODONE HCL 5 MG/5ML PO SOLN: 5.0000 mg | Freq: Once | ORAL | Status: DC | PRN

## 2022-03-23 MED ORDER — PROPOFOL 10 MG/ML IV BOLUS
INTRAVENOUS | Status: DC | PRN
  Administered 2022-03-23: 140 mg via INTRAVENOUS

## 2022-03-23 MED ORDER — IOHEXOL 300 MG/ML  SOLN
INTRAMUSCULAR | Status: DC | PRN
  Administered 2022-03-23: 10 mL

## 2022-03-23 MED ORDER — LIDOCAINE 2% (20 MG/ML) 5 ML SYRINGE
INTRAMUSCULAR | Status: DC | PRN
  Administered 2022-03-23: 100 mg via INTRAVENOUS

## 2022-03-23 MED ORDER — EPHEDRINE SULFATE-NACL 50-0.9 MG/10ML-% IV SOSY
PREFILLED_SYRINGE | INTRAVENOUS | Status: DC | PRN
  Administered 2022-03-23: 10 mg via INTRAVENOUS
  Administered 2022-03-23 (×2): 5 mg via INTRAVENOUS

## 2022-03-23 MED ORDER — FENTANYL CITRATE (PF) 100 MCG/2ML IJ SOLN
INTRAMUSCULAR | Status: AC
  Filled 2022-03-23: qty 2

## 2022-03-23 MED ORDER — LIDOCAINE HCL (PF) 2 % IJ SOLN
INTRAMUSCULAR | Status: AC
  Filled 2022-03-23: qty 5

## 2022-03-23 MED ORDER — CEFAZOLIN SODIUM-DEXTROSE 2-4 GM/100ML-% IV SOLN
INTRAVENOUS | Status: AC
  Filled 2022-03-23: qty 100

## 2022-03-23 MED ORDER — ONDANSETRON HCL 4 MG/2ML IJ SOLN
INTRAMUSCULAR | Status: DC | PRN
  Administered 2022-03-23: 4 mg via INTRAVENOUS

## 2022-03-23 MED ORDER — FENTANYL CITRATE (PF) 100 MCG/2ML IJ SOLN
INTRAMUSCULAR | Status: DC | PRN
  Administered 2022-03-23: 50 ug via INTRAVENOUS
  Administered 2022-03-23 (×2): 25 ug via INTRAVENOUS

## 2022-03-23 MED ORDER — SODIUM CHLORIDE 0.9 % IR SOLN
Status: DC | PRN
  Administered 2022-03-23: 3000 mL

## 2022-03-23 MED ORDER — DEXAMETHASONE SODIUM PHOSPHATE 10 MG/ML IJ SOLN
INTRAMUSCULAR | Status: DC | PRN
  Administered 2022-03-23: 10 mg via INTRAVENOUS

## 2022-03-23 SURGICAL SUPPLY — 27 items
BAG DRAIN URO-CYSTO SKYTR STRL (DRAIN) ×3 IMPLANT
BAG DRN UROCATH (DRAIN) ×2
BASKET ZERO TIP NITINOL 2.4FR (BASKET) IMPLANT
BSKT STON RTRVL ZERO TP 2.4FR (BASKET)
CATH INTERMIT  6FR 70CM (CATHETERS) ×3 IMPLANT
CLOTH BEACON ORANGE TIMEOUT ST (SAFETY) ×3 IMPLANT
COVER DOME SNAP 22 D (MISCELLANEOUS) ×2 IMPLANT
DRSG TEGADERM 4X4.75 (GAUZE/BANDAGES/DRESSINGS) ×1 IMPLANT
ELECT REM PT RETURN 9FT ADLT (ELECTROSURGICAL)
ELECTRODE REM PT RTRN 9FT ADLT (ELECTROSURGICAL) IMPLANT
FIBER LASER FLEXIVA 365 (UROLOGICAL SUPPLIES) IMPLANT
GAUZE SPONGE 4X4 12PLY STRL (GAUZE/BANDAGES/DRESSINGS) ×1 IMPLANT
GLOVE BIO SURGEON STRL SZ8 (GLOVE) ×3 IMPLANT
GOWN STRL REUS W/TWL XL LVL3 (GOWN DISPOSABLE) ×3 IMPLANT
GUIDEWIRE ANG ZIPWIRE 038X150 (WIRE) IMPLANT
GUIDEWIRE STR DUAL SENSOR (WIRE) IMPLANT
IV NS IRRIG 3000ML ARTHROMATIC (IV SOLUTION) ×5 IMPLANT
KIT TURNOVER CYSTO (KITS) ×3 IMPLANT
MANIFOLD NEPTUNE II (INSTRUMENTS) ×3 IMPLANT
NS IRRIG 500ML POUR BTL (IV SOLUTION) ×3 IMPLANT
PACK CYSTO (CUSTOM PROCEDURE TRAY) ×3 IMPLANT
SHEATH URETERAL 12FRX35CM (MISCELLANEOUS) IMPLANT
STENT URET 6FRX24 CONTOUR (STENTS) ×1 IMPLANT
TRACTIP FLEXIVA PULS ID 200XHI (Laser) IMPLANT
TRACTIP FLEXIVA PULSE ID 200 (Laser)
TUBE CONNECTING 12X1/4 (SUCTIONS) ×1 IMPLANT
TUBING UROLOGY SET (TUBING) IMPLANT

## 2022-03-23 NOTE — Transfer of Care (Signed)
Immediate Anesthesia Transfer of Care Note  Patient: LUCAS EXLINE  Procedure(s) Performed: CYSTOSCOPY WITH RETROGRADE PYELOGRAM, LEFT URETERAL STENT PLACEMENT (Left: Ureter) NEPHROSTOMY TUBE REMOVAL (Left: Flank)  Patient Location: PACU  Anesthesia Type:General  Level of Consciousness: awake, alert  and oriented  Airway & Oxygen Therapy: Patient Spontanous Breathing and Patient connected to face mask oxygen  Post-op Assessment: Report given to RN and Post -op Vital signs reviewed and stable  Post vital signs: Reviewed and stable  Last Vitals:  Vitals Value Taken Time  BP 110/74   Temp    Pulse 65 03/23/22 1448  Resp 15 03/23/22 1448  SpO2 100 % 03/23/22 1448  Vitals shown include unvalidated device data.  Last Pain:  Vitals:   03/23/22 1229  TempSrc: Oral  PainSc: 0-No pain      Patients Stated Pain Goal: 5 (16/10/96 0454)  Complications: No notable events documented.

## 2022-03-23 NOTE — Anesthesia Procedure Notes (Signed)
Procedure Name: LMA Insertion Date/Time: 03/23/2022 2:18 PM  Performed by: Genelle Bal, CRNAPre-anesthesia Checklist: Patient identified, Emergency Drugs available, Suction available and Patient being monitored Patient Re-evaluated:Patient Re-evaluated prior to induction Oxygen Delivery Method: Circle system utilized Preoxygenation: Pre-oxygenation with 100% oxygen Induction Type: IV induction Ventilation: Mask ventilation without difficulty LMA: LMA inserted LMA Size: 4.0 Number of attempts: 1 Airway Equipment and Method: Bite block Placement Confirmation: positive ETCO2 Tube secured with: Tape Dental Injury: Teeth and Oropharynx as per pre-operative assessment

## 2022-03-23 NOTE — Op Note (Signed)
Preoperative diagnosis: History of urothelial carcinoma of the left renal pelvis and ureter  Postoperative diagnosis: Same  Principal procedure: Cystoscopy, left retrograde pyelogram, use of fluoroscopy for under 1 hour, placement of 6 French by 24 cm contour double-J stent without tether, antegrade nephrostogram through left nephrostomy tube, removal of nephrostomy tube  Surgeon: Artina Minella  Anesthesia: General with LMA  Complications: None  Specimen: None  Estimated blood loss: None  Indications: 76 year old female with history of urothelial carcinoma.  Years ago she had right nephro ureterectomy performed for high-grade urothelial carcinoma of the right distal ureter.  She has not had any evidence of recurrence until August 2020 at which time she was found to have left ureteral cancer upon presentation for acute renal failure and congestive heart failure.  Urothelial carcinoma of the left ureter has been treated with laser ablation.  In 2022 she was found to have small volume recurrence in her left calyceal system.  This was treated with focal ablation but also with injection of Jelmyto in the left collecting system.  She was on her second round of this until recently when she has had significant pain, and left ureteral obstruction.  We have put the [place] on hold.  Recent CT scan revealed retroperitoneal adenopathy, mild.  She is scheduled for eventual PET scan to evaluate activity in these nodes.  She presents at this time for removal of her nephrostomy tube which has been significantly painful for her and has leaked.  She is having a stent placed today as well.  Findings: Urothelium of the bladder was normal.  Right ureteral orifice was absent.  Left was normally located and configured.  Retrograde study of the left ureter was performed with a 6 [place] open-ended catheter and Omnipaque.  Distal ureter appeared normal but there was absolutely no flow into the mid or upper ureter.   Pyelocalyceal system was not identified with the retrograde study.  Following placement of the double-J stent, antegrade nephrostogram was performed.  This revealed decompressed pyelocalyceal system, no filling defects, and adequate positioning of the proximal coil of the stent.  Description of procedure: The patient was properly identified and marked in the holding area.  She was taken the operating room where general anesthetic was administered with the LMA.  She was placed in the dorsolithotomy position.  Genitalia and perineum were prepped, draped, proper timeout performed.  21 French panendoscope advanced into the bladder.  Systematic inspection was performed with normal findings.  Left ureter was cannulated with a 6 [place] open-ended catheter.  Using Omnipaque retrograde study was performed.  This revealed filling of the distal ureter without any evidence of filling defects.  There was obvious occlusion of the ureter above this.  No specific lobular filling defects were seen.  I then negotiated a sensor tip guidewire through the open-ended catheter and up easily passed the obstructed area.  An excellent curl was seen in the upper pole calyx.  I then negotiated the 6 [place] by 24 cm contour double-J stent over top of this.  Once adequately positioned it was deployed with removing the guidewire.  Excellent proximal and distal curls were seen.  At this point I injected contrast through the nephrostomy tube.  This showed filling of the collecting system, no evident filling defects and adequate positioning of the proximal end of the stent.  I then removed the nephrostomy tube and the site was dressed.  This point procedure was terminated.  The patient was awakened, taken to the PACU in  stable condition, tolerated the procedure well.

## 2022-03-23 NOTE — Interval H&P Note (Signed)
History and Physical Interval Note:  03/23/2022 12:57 PM  [name]  has presented today for surgery, with the diagnosis of LEFT URETERAL CANCER.  The various methods of treatment have been discussed with the patient and family. After consideration of risks, benefits and other options for treatment, the patient has consented to  Procedure(s) with comments: CYSTOSCOPY WITH RETROGRADE PYELOGRAM/URETERAL STENT PLACEMENT (Left) - 30 MINS NEPHROSTOMY TUBE REMOVAL (Left) as a surgical intervention.  The patient's history has been reviewed, patient examined, no change in status, stable for surgery.  I have reviewed the patient's chart and labs.  Questions were answered to the patient's satisfaction.     Lillette Boxer Shakemia Madera

## 2022-03-23 NOTE — Anesthesia Preprocedure Evaluation (Addendum)
Anesthesia Evaluation  Patient identified by MRN, date of birth, ID band Patient awake    Reviewed: Allergy & Precautions, NPO status , Patient's Chart, lab work & pertinent test results, reviewed documented beta blocker date and time   History of Anesthesia Complications Negative for: history of anesthetic complications  Airway Mallampati: II  TM Distance: >3 FB Neck ROM: Full    Dental no notable dental hx.    Pulmonary former smoker,    Pulmonary exam normal        Cardiovascular hypertension, Pt. on medications and Pt. on home beta blockers + CAD and +CHF  Normal cardiovascular exam     Neuro/Psych Anxiety Depression negative neurological ROS     GI/Hepatic Neg liver ROS, GERD  Controlled,  Endo/Other  negative endocrine ROS  Renal/GU Renal InsufficiencyRenal disease  negative genitourinary   Musculoskeletal negative musculoskeletal ROS (+)   Abdominal   Peds  Hematology  (+) Blood dyscrasia (Hgb 10.6), anemia ,   Anesthesia Other Findings Day of surgery medications reviewed with patient.  Reproductive/Obstetrics negative OB ROS                            Anesthesia Physical Anesthesia Plan  ASA: 2  Anesthesia Plan: General   Post-op Pain Management: Tylenol PO (pre-op)*   Induction: Intravenous  PONV Risk Score and Plan: 3 and Treatment may vary due to age or medical condition, Ondansetron and Dexamethasone  Airway Management Planned: LMA  Additional Equipment: None  Intra-op Plan:   Post-operative Plan: Extubation in OR  Informed Consent: I have reviewed the patients History and Physical, chart, labs and discussed the procedure including the risks, benefits and alternatives for the proposed anesthesia with the patient or authorized representative who has indicated his/her understanding and acceptance.     Dental advisory given  Plan Discussed with:  CRNA  Anesthesia Plan Comments:        Anesthesia Quick Evaluation

## 2022-03-23 NOTE — Anesthesia Postprocedure Evaluation (Signed)
Anesthesia Post Note  Patient: NAESHA BUCKALEW  Procedure(s) Performed: CYSTOSCOPY WITH RETROGRADE PYELOGRAM, LEFT URETERAL STENT PLACEMENT (Left: Ureter) NEPHROSTOMY TUBE REMOVAL (Left: Flank)     Patient location during evaluation: PACU Anesthesia Type: General Level of consciousness: awake and alert Pain management: pain level controlled Vital Signs Assessment: post-procedure vital signs reviewed and stable Respiratory status: spontaneous breathing, nonlabored ventilation and respiratory function stable Cardiovascular status: blood pressure returned to baseline Postop Assessment: no apparent nausea or vomiting Anesthetic complications: no   No notable events documented.  Last Vitals:  Vitals:   03/23/22 1500 03/23/22 1515  BP: (!) 113/54 95/77  Pulse: 65 63  Resp: 17 13  Temp:    SpO2: 90% (!) 88%    Last Pain:  Vitals:   03/23/22 1515  TempSrc:   PainSc: 0-No pain                 Marthenia Rolling

## 2022-03-23 NOTE — Discharge Instructions (Addendum)
You may see some blood in the urine and may have some burning with urination for 48-72 hours. You also may notice that you have to urinate more frequently or urgently after your procedure which is normal.  You should call should you develop an inability urinate, fever > 101, persistent nausea and vomiting that prevents you from eating or drinking to stay hydrated.  If you have a stent, you will likely urinate more frequently and urgently until the stent is removed and you may experience some discomfort/pain in the lower abdomen and flank especially when urinating. You may take pain medication prescribed to you if needed for pain. You may also intermittently have blood in the urine until the stent is removed.    [facility] [phone] Post Ureteroscopy With or Without Stent Instructions  Definitions:  Ureter: The duct that transports urine from the kidney to the bladder. Stent:   A plastic hollow tube that is placed into the ureter, from the kidney to the bladder to prevent the ureter from swelling shut.  GENERAL INSTRUCTIONS:  Despite the fact that no skin incisions were used, the area around the ureter and bladder is raw and irritated. The stent is a foreign body which will further irritate the bladder wall. This irritation is manifested by increased frequency of urination, both day and night, and by an increase in the urge to urinate. In some, the urge to urinate is present almost always. Sometimes the urge is strong enough that you may not be able to stop yourself from urinating. The only real cure is to remove the stent and then give time for the bladder wall to heal which can't be done until the danger of the ureter swelling shut has passed, which varies.  You may see some blood in your urine while the stent is in place and a few days afterwards. Do not be alarmed, even if the urine was clear for a while. Get off your feet and drink lots of fluids until clearing occurs. If  you start to pass clots or don't improve, call us.  DIET: You may return to your normal diet immediately. Because of the raw surface of your bladder, alcohol, spicy foods, acid type foods and drinks with caffeine may cause irritation or frequency and should be used in moderation. To keep your urine flowing freely and to avoid constipation, drink plenty of fluids during the day ( 8-10 glasses ). Tip: Avoid cranberry juice because it is very acidic.  ACTIVITY: Your physical activity doesn't need to be restricted. However, if you are very active, you may see some blood in your urine. We suggest that you reduce your activity under these circumstances until the bleeding has stopped.  BOWELS: It is important to keep your bowels regular during the postoperative period. Straining with bowel movements can cause bleeding. A bowel movement every other day is reasonable. Use a mild laxative if needed, such as Milk of Magnesia 2-3 tablespoons, or 2 Dulcolax tablets. Call if you continue to have problems. If you have been taking narcotics for pain, before, during or after your surgery, you may be constipated. Take a laxative if necessary.   MEDICATION: You should resume your pre-surgery medications unless told not to. In addition you will often be given an antibiotic to prevent infection. These should be taken as prescribed until the bottles are finished unless you are having an unusual reaction to one of the drugs.  PROBLEMS YOU SHOULD REPORT TO [place]: Fevers over 100.5 Fahrenheit.  Heavy bleeding, or clots ( See above notes about blood in urine ). Inability to urinate. Drug reactions ( hives, rash, nausea, vomiting, diarrhea ). Severe burning or pain with urination that is not improving.  FOLLOW-UP: You will need a follow-up appointment to monitor your progress. Call for this appointment at the number listed above. Usually the first appointment will be about three to fourteen days after your surgery.       Post Anesthesia Home Care Instructions  Activity: Get plenty of rest for the remainder of the day. A responsible individual must stay with you for 24 hours following the procedure.  For the next 24 hours, DO NOT: -Drive a car -Paediatric nurse -Drink alcoholic beverages -Take any medication unless instructed by your physician -Make any legal decisions or sign important papers.  Meals: Start with liquid foods such as gelatin or soup. Progress to regular foods as tolerated. Avoid greasy, spicy, heavy foods. If nausea and/or vomiting occur, drink only clear liquids until the nausea and/or vomiting subsides. Call your physician if vomiting continues.  Special Instructions/Symptoms: Your throat may feel dry or sore from the anesthesia or the breathing tube placed in your throat during surgery. If this causes discomfort, gargle with warm salt water. The discomfort should disappear within 24 hours.

## 2022-03-24 ENCOUNTER — Encounter (HOSPITAL_BASED_OUTPATIENT_CLINIC_OR_DEPARTMENT_OTHER): Payer: Self-pay | Admitting: Urology

## 2022-04-05 ENCOUNTER — Encounter (HOSPITAL_COMMUNITY)
Admission: RE | Admit: 2022-04-05 | Discharge: 2022-04-05 | Disposition: A | Discharge status: Home / Self Care | Payer: Medicare PPO | Source: Ambulatory Visit | Attending: Urology | Admitting: Urology

## 2022-04-05 DIAGNOSIS — K449 Diaphragmatic hernia without obstruction or gangrene: Secondary | ICD-10-CM | POA: Diagnosis not present

## 2022-04-05 DIAGNOSIS — C662 Malignant neoplasm of left ureter: Secondary | ICD-10-CM | POA: Diagnosis not present

## 2022-04-05 DIAGNOSIS — K5730 Diverticulosis of large intestine without perforation or abscess without bleeding: Secondary | ICD-10-CM | POA: Diagnosis not present

## 2022-04-05 DIAGNOSIS — I2510 Atherosclerotic heart disease of native coronary artery without angina pectoris: Secondary | ICD-10-CM | POA: Diagnosis not present

## 2022-04-05 LAB — GLUCOSE, CAPILLARY: Glucose-Capillary: 112 mg/dL — ABNORMAL HIGH (ref 70–99)

## 2022-04-05 MED ORDER — FLUDEOXYGLUCOSE F - 18 (FDG) INJECTION
8.6000 | Freq: Once | INTRAVENOUS | Status: AC
  Administered 2022-04-05: 8.5 via INTRAVENOUS

## 2022-04-08 DIAGNOSIS — Z86711 Personal history of pulmonary embolism: Secondary | ICD-10-CM

## 2022-04-08 HISTORY — DX: Personal history of pulmonary embolism: Z86.711

## 2022-04-19 DIAGNOSIS — C662 Malignant neoplasm of left ureter: Secondary | ICD-10-CM | POA: Diagnosis not present

## 2022-04-19 DIAGNOSIS — C652 Malignant neoplasm of left renal pelvis: Secondary | ICD-10-CM | POA: Diagnosis not present

## 2022-04-21 ENCOUNTER — Encounter (HOSPITAL_COMMUNITY): Payer: Self-pay | Admitting: Emergency Medicine

## 2022-04-21 ENCOUNTER — Inpatient Hospital Stay (HOSPITAL_COMMUNITY)
Admission: EM | Admit: 2022-04-21 | Discharge: 2022-04-26 | DRG: 698 | Disposition: A | Discharge status: Home Health | Payer: Medicare PPO | Attending: Family Medicine | Admitting: Family Medicine

## 2022-04-21 ENCOUNTER — Emergency Department (HOSPITAL_COMMUNITY): Payer: Medicare PPO

## 2022-04-21 ENCOUNTER — Other Ambulatory Visit: Payer: Self-pay

## 2022-04-21 DIAGNOSIS — F32A Depression, unspecified: Secondary | ICD-10-CM | POA: Diagnosis present

## 2022-04-21 DIAGNOSIS — I2510 Atherosclerotic heart disease of native coronary artery without angina pectoris: Secondary | ICD-10-CM | POA: Diagnosis present

## 2022-04-21 DIAGNOSIS — Z7982 Long term (current) use of aspirin: Secondary | ICD-10-CM

## 2022-04-21 DIAGNOSIS — Z8616 Personal history of COVID-19: Secondary | ICD-10-CM | POA: Diagnosis not present

## 2022-04-21 DIAGNOSIS — N12 Tubulo-interstitial nephritis, not specified as acute or chronic: Secondary | ICD-10-CM | POA: Diagnosis not present

## 2022-04-21 DIAGNOSIS — Z0389 Encounter for observation for other suspected diseases and conditions ruled out: Secondary | ICD-10-CM | POA: Diagnosis not present

## 2022-04-21 DIAGNOSIS — Z87891 Personal history of nicotine dependence: Secondary | ICD-10-CM

## 2022-04-21 DIAGNOSIS — Z833 Family history of diabetes mellitus: Secondary | ICD-10-CM

## 2022-04-21 DIAGNOSIS — A419 Sepsis, unspecified organism: Secondary | ICD-10-CM

## 2022-04-21 DIAGNOSIS — Z9181 History of falling: Secondary | ICD-10-CM | POA: Diagnosis not present

## 2022-04-21 DIAGNOSIS — Z6830 Body mass index (BMI) 30.0-30.9, adult: Secondary | ICD-10-CM

## 2022-04-21 DIAGNOSIS — E785 Hyperlipidemia, unspecified: Secondary | ICD-10-CM | POA: Diagnosis present

## 2022-04-21 DIAGNOSIS — Z882 Allergy status to sulfonamides status: Secondary | ICD-10-CM | POA: Diagnosis not present

## 2022-04-21 DIAGNOSIS — C662 Malignant neoplasm of left ureter: Secondary | ICD-10-CM | POA: Diagnosis present

## 2022-04-21 DIAGNOSIS — R319 Hematuria, unspecified: Secondary | ICD-10-CM | POA: Diagnosis not present

## 2022-04-21 DIAGNOSIS — Z1152 Encounter for screening for COVID-19: Secondary | ICD-10-CM

## 2022-04-21 DIAGNOSIS — R531 Weakness: Secondary | ICD-10-CM | POA: Diagnosis present

## 2022-04-21 DIAGNOSIS — I5042 Chronic combined systolic (congestive) and diastolic (congestive) heart failure: Secondary | ICD-10-CM | POA: Diagnosis present

## 2022-04-21 DIAGNOSIS — I5020 Unspecified systolic (congestive) heart failure: Secondary | ICD-10-CM | POA: Diagnosis not present

## 2022-04-21 DIAGNOSIS — I2699 Other pulmonary embolism without acute cor pulmonale: Secondary | ICD-10-CM | POA: Diagnosis not present

## 2022-04-21 DIAGNOSIS — T83592A Infection and inflammatory reaction due to indwelling ureteral stent, initial encounter: Principal | ICD-10-CM | POA: Diagnosis present

## 2022-04-21 DIAGNOSIS — W19XXXA Unspecified fall, initial encounter: Secondary | ICD-10-CM | POA: Diagnosis not present

## 2022-04-21 DIAGNOSIS — Z79899 Other long term (current) drug therapy: Secondary | ICD-10-CM | POA: Diagnosis not present

## 2022-04-21 DIAGNOSIS — Z905 Acquired absence of kidney: Secondary | ICD-10-CM

## 2022-04-21 DIAGNOSIS — Z90711 Acquired absence of uterus with remaining cervical stump: Secondary | ICD-10-CM

## 2022-04-21 DIAGNOSIS — Z888 Allergy status to other drugs, medicaments and biological substances status: Secondary | ICD-10-CM | POA: Diagnosis not present

## 2022-04-21 DIAGNOSIS — R0689 Other abnormalities of breathing: Secondary | ICD-10-CM | POA: Diagnosis not present

## 2022-04-21 DIAGNOSIS — Z8249 Family history of ischemic heart disease and other diseases of the circulatory system: Secondary | ICD-10-CM

## 2022-04-21 DIAGNOSIS — A411 Sepsis due to other specified staphylococcus: Secondary | ICD-10-CM | POA: Diagnosis present

## 2022-04-21 DIAGNOSIS — I130 Hypertensive heart and chronic kidney disease with heart failure and stage 1 through stage 4 chronic kidney disease, or unspecified chronic kidney disease: Secondary | ICD-10-CM | POA: Diagnosis not present

## 2022-04-21 DIAGNOSIS — R6520 Severe sepsis without septic shock: Secondary | ICD-10-CM | POA: Diagnosis present

## 2022-04-21 DIAGNOSIS — N10 Acute tubulo-interstitial nephritis: Secondary | ICD-10-CM | POA: Diagnosis present

## 2022-04-21 DIAGNOSIS — Z906 Acquired absence of other parts of urinary tract: Secondary | ICD-10-CM | POA: Diagnosis not present

## 2022-04-21 DIAGNOSIS — F419 Anxiety disorder, unspecified: Secondary | ICD-10-CM | POA: Diagnosis present

## 2022-04-21 DIAGNOSIS — K219 Gastro-esophageal reflux disease without esophagitis: Secondary | ICD-10-CM | POA: Diagnosis present

## 2022-04-21 DIAGNOSIS — Z6831 Body mass index (BMI) 31.0-31.9, adult: Secondary | ICD-10-CM

## 2022-04-21 DIAGNOSIS — R0902 Hypoxemia: Secondary | ICD-10-CM | POA: Diagnosis not present

## 2022-04-21 DIAGNOSIS — E669 Obesity, unspecified: Secondary | ICD-10-CM | POA: Diagnosis present

## 2022-04-21 DIAGNOSIS — C689 Malignant neoplasm of urinary organ, unspecified: Secondary | ICD-10-CM | POA: Diagnosis present

## 2022-04-21 DIAGNOSIS — N1832 Chronic kidney disease, stage 3b: Secondary | ICD-10-CM | POA: Diagnosis present

## 2022-04-21 DIAGNOSIS — I2693 Single subsegmental pulmonary embolism without acute cor pulmonale: Secondary | ICD-10-CM | POA: Diagnosis not present

## 2022-04-21 DIAGNOSIS — Y732 Prosthetic and other implants, materials and accessory gastroenterology and urology devices associated with adverse incidents: Secondary | ICD-10-CM | POA: Diagnosis present

## 2022-04-21 DIAGNOSIS — I10 Essential (primary) hypertension: Secondary | ICD-10-CM | POA: Diagnosis not present

## 2022-04-21 DIAGNOSIS — Z9221 Personal history of antineoplastic chemotherapy: Secondary | ICD-10-CM

## 2022-04-21 DIAGNOSIS — R112 Nausea with vomiting, unspecified: Secondary | ICD-10-CM | POA: Diagnosis present

## 2022-04-21 DIAGNOSIS — R509 Fever, unspecified: Secondary | ICD-10-CM | POA: Diagnosis not present

## 2022-04-21 DIAGNOSIS — R109 Unspecified abdominal pain: Secondary | ICD-10-CM | POA: Diagnosis not present

## 2022-04-21 DIAGNOSIS — J9601 Acute respiratory failure with hypoxia: Secondary | ICD-10-CM

## 2022-04-21 DIAGNOSIS — Z87440 Personal history of urinary (tract) infections: Secondary | ICD-10-CM

## 2022-04-21 DIAGNOSIS — A4101 Sepsis due to Methicillin susceptible Staphylococcus aureus: Secondary | ICD-10-CM | POA: Diagnosis not present

## 2022-04-21 LAB — CBC WITH DIFFERENTIAL/PLATELET
Abs Immature Granulocytes: 0.1 10*3/uL — ABNORMAL HIGH (ref 0.00–0.07)
Basophils Absolute: 0 10*3/uL (ref 0.0–0.1)
Basophils Relative: 0 %
Eosinophils Absolute: 0 10*3/uL (ref 0.0–0.5)
Eosinophils Relative: 0 %
HCT: 31.6 % — ABNORMAL LOW (ref 36.0–46.0)
Hemoglobin: 10.2 g/dL — ABNORMAL LOW (ref 12.0–15.0)
Immature Granulocytes: 1 %
Lymphocytes Relative: 6 %
Lymphs Abs: 1 10*3/uL (ref 0.7–4.0)
MCH: 30.1 pg (ref 26.0–34.0)
MCHC: 32.3 g/dL (ref 30.0–36.0)
MCV: 93.2 fL (ref 80.0–100.0)
Monocytes Absolute: 1.1 10*3/uL — ABNORMAL HIGH (ref 0.1–1.0)
Monocytes Relative: 6 %
Neutro Abs: 15 10*3/uL — ABNORMAL HIGH (ref 1.7–7.7)
Neutrophils Relative %: 87 %
Platelets: 261 10*3/uL (ref 150–400)
RBC: 3.39 MIL/uL — ABNORMAL LOW (ref 3.87–5.11)
RDW: 17 % — ABNORMAL HIGH (ref 11.5–15.5)
WBC: 17.3 10*3/uL — ABNORMAL HIGH (ref 4.0–10.5)
nRBC: 0 % (ref 0.0–0.2)

## 2022-04-21 LAB — CBC
HCT: 27.9 % — ABNORMAL LOW (ref 36.0–46.0)
Hemoglobin: 8.8 g/dL — ABNORMAL LOW (ref 12.0–15.0)
MCH: 29.4 pg (ref 26.0–34.0)
MCHC: 31.5 g/dL (ref 30.0–36.0)
MCV: 93.3 fL (ref 80.0–100.0)
Platelets: 208 10*3/uL (ref 150–400)
RBC: 2.99 MIL/uL — ABNORMAL LOW (ref 3.87–5.11)
RDW: 17.1 % — ABNORMAL HIGH (ref 11.5–15.5)
WBC: 16 10*3/uL — ABNORMAL HIGH (ref 4.0–10.5)
nRBC: 0 % (ref 0.0–0.2)

## 2022-04-21 LAB — COMPREHENSIVE METABOLIC PANEL
ALT: 13 U/L (ref 0–44)
AST: 19 U/L (ref 15–41)
Albumin: 3 g/dL — ABNORMAL LOW (ref 3.5–5.0)
Alkaline Phosphatase: 50 U/L (ref 38–126)
Anion gap: 9 (ref 5–15)
BUN: 12 mg/dL (ref 8–23)
CO2: 30 mmol/L (ref 22–32)
Calcium: 8.7 mg/dL — ABNORMAL LOW (ref 8.9–10.3)
Chloride: 100 mmol/L (ref 98–111)
Creatinine, Ser: 1.39 mg/dL — ABNORMAL HIGH (ref 0.44–1.00)
GFR, Estimated: 40 mL/min — ABNORMAL LOW (ref >60)
Glucose, Bld: 133 mg/dL — ABNORMAL HIGH (ref 70–99)
Potassium: 3.6 mmol/L (ref 3.5–5.1)
Sodium: 139 mmol/L (ref 135–145)
Total Bilirubin: 0.5 mg/dL (ref 0.3–1.2)
Total Protein: 7.2 g/dL (ref 6.5–8.1)

## 2022-04-21 LAB — CREATININE, SERUM
Creatinine, Ser: 1.26 mg/dL — ABNORMAL HIGH (ref 0.44–1.00)
GFR, Estimated: 45 mL/min — ABNORMAL LOW

## 2022-04-21 LAB — LACTIC ACID, PLASMA
Lactic Acid, Venous: 2.6 mmol/L (ref 0.5–1.9)
Lactic Acid, Venous: 1 mmol/L (ref 0.5–1.9)
Lactic Acid, Venous: 1.4 mmol/L (ref 0.5–1.9)

## 2022-04-21 LAB — RESP PANEL BY RT-PCR (FLU A&B, COVID) ARPGX2
Influenza A by PCR: NEGATIVE
Influenza B by PCR: NEGATIVE
SARS Coronavirus 2 by RT PCR: NEGATIVE

## 2022-04-21 LAB — BLOOD GAS, ARTERIAL
Acid-Base Excess: 6.9 mmol/L — ABNORMAL HIGH (ref 0.0–2.0)
Bicarbonate: 31.3 mmol/L — ABNORMAL HIGH (ref 20.0–28.0)
O2 Saturation: 100 %
Patient temperature: 37.7
pCO2 arterial: 44 mmHg (ref 32–48)
pH, Arterial: 7.46 — ABNORMAL HIGH (ref 7.35–7.45)
pO2, Arterial: 118 mmHg — ABNORMAL HIGH (ref 83–108)

## 2022-04-21 LAB — TSH: TSH: 0.242 u[IU]/mL — ABNORMAL LOW (ref 0.350–4.500)

## 2022-04-21 LAB — PROTIME-INR
INR: 1.2 (ref 0.8–1.2)
Prothrombin Time: 14.9 seconds (ref 11.4–15.2)

## 2022-04-21 LAB — BRAIN NATRIURETIC PEPTIDE: B Natriuretic Peptide: 156.6 pg/mL — ABNORMAL HIGH (ref 0.0–100.0)

## 2022-04-21 LAB — PROCALCITONIN: Procalcitonin: 0.25 ng/mL

## 2022-04-21 LAB — D-DIMER, QUANTITATIVE: D-Dimer, Quant: 2.62 ug/mL-FEU — ABNORMAL HIGH (ref 0.00–0.50)

## 2022-04-21 LAB — APTT: aPTT: 31 seconds (ref 24–36)

## 2022-04-21 MED ORDER — ONDANSETRON HCL 4 MG/2ML IJ SOLN
4.0000 mg | Freq: Four times a day (QID) | INTRAMUSCULAR | Status: DC | PRN
  Administered 2022-04-22: 4 mg via INTRAVENOUS
  Filled 2022-04-21: qty 2

## 2022-04-21 MED ORDER — ACETAMINOPHEN 500 MG PO TABS
1000.0000 mg | ORAL_TABLET | Freq: Once | ORAL | Status: AC
  Administered 2022-04-21: 1000 mg via ORAL
  Filled 2022-04-21: qty 2

## 2022-04-21 MED ORDER — CARVEDILOL 6.25 MG PO TABS
6.2500 mg | ORAL_TABLET | Freq: Two times a day (BID) | ORAL | Status: DC
  Administered 2022-04-22 – 2022-04-24 (×5): 6.25 mg via ORAL
  Filled 2022-04-21 (×4): qty 1
  Filled 2022-04-21: qty 2

## 2022-04-21 MED ORDER — OXYCODONE HCL 5 MG PO TABS
5.0000 mg | ORAL_TABLET | Freq: Four times a day (QID) | ORAL | Status: DC | PRN
  Administered 2022-04-22 – 2022-04-25 (×4): 5 mg via ORAL
  Filled 2022-04-21 (×4): qty 1

## 2022-04-21 MED ORDER — ISOSORBIDE MONONITRATE ER 30 MG PO TB24
15.0000 mg | ORAL_TABLET | Freq: Every day | ORAL | Status: DC
  Administered 2022-04-22 – 2022-04-26 (×5): 15 mg via ORAL
  Filled 2022-04-21 (×5): qty 1

## 2022-04-21 MED ORDER — OXYBUTYNIN CHLORIDE 5 MG PO TABS: 5.0000 mg | ORAL_TABLET | ORAL | Status: DC | PRN

## 2022-04-21 MED ORDER — ROSUVASTATIN CALCIUM 20 MG PO TABS
40.0000 mg | ORAL_TABLET | Freq: Every day | ORAL | Status: DC
  Administered 2022-04-22 – 2022-04-26 (×5): 40 mg via ORAL
  Filled 2022-04-21 (×5): qty 2

## 2022-04-21 MED ORDER — ONDANSETRON HCL 4 MG/2ML IJ SOLN
4.0000 mg | Freq: Once | INTRAMUSCULAR | Status: AC
  Administered 2022-04-21: 4 mg via INTRAVENOUS
  Filled 2022-04-21: qty 2

## 2022-04-21 MED ORDER — ASPIRIN 81 MG PO TBEC
81.0000 mg | DELAYED_RELEASE_TABLET | Freq: Every day | ORAL | Status: DC
  Administered 2022-04-22 – 2022-04-26 (×5): 81 mg via ORAL
  Filled 2022-04-21 (×5): qty 1

## 2022-04-21 MED ORDER — IOHEXOL 300 MG/ML  SOLN
75.0000 mL | Freq: Once | INTRAMUSCULAR | Status: AC | PRN
  Administered 2022-04-21: 75 mL via INTRAVENOUS

## 2022-04-21 MED ORDER — LACTATED RINGERS IV BOLUS (SEPSIS)
1000.0000 mL | Freq: Once | INTRAVENOUS | Status: AC
  Administered 2022-04-21: 1000 mL via INTRAVENOUS

## 2022-04-21 MED ORDER — SERTRALINE HCL 50 MG PO TABS
50.0000 mg | ORAL_TABLET | Freq: Every day | ORAL | Status: DC
  Administered 2022-04-22 – 2022-04-26 (×5): 50 mg via ORAL
  Filled 2022-04-21 (×5): qty 1

## 2022-04-21 MED ORDER — CALCIUM CARBONATE ANTACID 500 MG PO CHEW
1.0000 | CHEWABLE_TABLET | Freq: Every morning | ORAL | Status: DC
  Administered 2022-04-22 – 2022-04-26 (×5): 200 mg via ORAL
  Filled 2022-04-21 (×5): qty 1

## 2022-04-21 MED ORDER — SODIUM CHLORIDE 0.9 % IV SOLN
2.0000 g | INTRAVENOUS | Status: DC
  Administered 2022-04-21 – 2022-04-23 (×3): 2 g via INTRAVENOUS
  Filled 2022-04-21 (×3): qty 20

## 2022-04-21 MED ORDER — ONDANSETRON HCL 4 MG PO TABS
4.0000 mg | ORAL_TABLET | Freq: Four times a day (QID) | ORAL | Status: DC | PRN
  Administered 2022-04-23: 4 mg via ORAL
  Filled 2022-04-21 (×2): qty 1

## 2022-04-21 MED ORDER — DOCUSATE SODIUM 100 MG PO CAPS
100.0000 mg | ORAL_CAPSULE | Freq: Two times a day (BID) | ORAL | Status: DC
  Administered 2022-04-21 – 2022-04-26 (×10): 100 mg via ORAL
  Filled 2022-04-21 (×10): qty 1

## 2022-04-21 MED ORDER — SODIUM CHLORIDE (PF) 0.9 % IJ SOLN
INTRAMUSCULAR | Status: AC
  Filled 2022-04-21: qty 50

## 2022-04-21 MED ORDER — HYDRALAZINE HCL 50 MG PO TABS
50.0000 mg | ORAL_TABLET | Freq: Three times a day (TID) | ORAL | Status: DC
  Administered 2022-04-22 – 2022-04-23 (×2): 50 mg via ORAL
  Filled 2022-04-21 (×3): qty 1
  Filled 2022-04-21 (×2): qty 2

## 2022-04-21 MED ORDER — LACTATED RINGERS IV SOLN: INTRAVENOUS | Status: AC

## 2022-04-21 MED ORDER — HEPARIN SODIUM (PORCINE) 5000 UNIT/ML IJ SOLN
5000.0000 [IU] | Freq: Three times a day (TID) | INTRAMUSCULAR | Status: DC
  Administered 2022-04-21 – 2022-04-22 (×2): 5000 [IU] via SUBCUTANEOUS
  Filled 2022-04-21 (×2): qty 1

## 2022-04-21 MED ORDER — VITAMIN D 25 MCG (1000 UNIT) PO TABS
1000.0000 [IU] | ORAL_TABLET | Freq: Every day | ORAL | Status: DC
  Administered 2022-04-22 – 2022-04-26 (×5): 1000 [IU] via ORAL
  Filled 2022-04-21 (×5): qty 1

## 2022-04-21 MED ORDER — SODIUM CHLORIDE 0.9 % IV SOLN: INTRAVENOUS | Status: DC

## 2022-04-21 MED ORDER — LACTATED RINGERS IV BOLUS
1000.0000 mL | Freq: Once | INTRAVENOUS | Status: AC
  Administered 2022-04-21: 1000 mL via INTRAVENOUS

## 2022-04-21 MED ORDER — MORPHINE SULFATE (PF) 4 MG/ML IV SOLN
4.0000 mg | Freq: Once | INTRAVENOUS | Status: AC
  Administered 2022-04-21: 4 mg via INTRAVENOUS
  Filled 2022-04-21: qty 1

## 2022-04-21 MED ORDER — ACETAMINOPHEN 500 MG PO TABS
500.0000 mg | ORAL_TABLET | Freq: Four times a day (QID) | ORAL | Status: DC | PRN
  Administered 2022-04-22 – 2022-04-26 (×7): 500 mg via ORAL
  Filled 2022-04-21 (×7): qty 1

## 2022-04-21 NOTE — ED Provider Notes (Signed)
[facility] DEPT Provider Note   CSN: [id number] Arrival date & time: 04/21/22  1818     History  Chief Complaint  Patient presents with   Weakness    [name] is a 76 y.o. female.  Pt is a 76y/o female with hx of urothelial carcinoma s/p right nephro ureterectomy years ago with recurrence in left ureter which was approximately 2 years ago who recently had stent placed and did have nephrostomy tube receiving Jelmyto but has not had this since early June due to intolerance who is presenting today with persistent nausea vomiting, left-sided back pain and generalized weakness.  Patient reports that over the last few days she thinks she has been running a fever but the vomiting did not start till this morning.  Patient reports she does not typically vomit and this is unusual for her.  Today she was at home and she was walking in her house when her legs gave out and she fell to the floor.  She does not think she injured anything in the fall and denies hitting her head or loss of consciousness.  She is still urinating and denies any dysuria frequency or urgency.  She no longer has a nephrostomy tube but she does have a stent in.  She denies any abdominal pain, chest pain or shortness of breath.  The history is provided by the patient and medical records.  Weakness      Home Medications Prior to Admission medications   Medication Sig Start Date End Date Taking? Authorizing Provider  acetaminophen (TYLENOL) 500 MG tablet Take 500 mg by mouth every 6 (six) hours as needed for moderate pain.    [provider]  aspirin EC 81 MG tablet Take 1 tablet (81 mg total) by mouth daily. 11/03/19   Nahser, Wonda Cheng, MD  calcium carbonate (TUMS - DOSED IN MG ELEMENTAL CALCIUM) 500 MG chewable tablet Chew 1 tablet by mouth every morning.    [provider]  carvedilol (COREG) 6.25 MG tablet TAKE 1 TABLET(6.25 MG) BY MOUTH TWICE DAILY WITH A  MEAL Patient taking differently: Take 6.25 mg by mouth 2 (two) times daily with a meal. 11/22/21   Chandrasekhar, Mahesh A, MD  cholecalciferol (VITAMIN D3) 25 MCG (1000 UNIT) tablet Take 1,000 Units by mouth daily.    [provider]  doxycycline (VIBRA-TABS) 100 MG tablet Take 100 mg by mouth daily. Per patient - no stop date provided by the physician 02/08/22   [provider]  ezetimibe (ZETIA) 10 MG tablet Take 1 tablet (10 mg total) by mouth daily. Patient not taking: Reported on 03/11/2022 12/21/21   Rudean Haskell A, MD  hydrALAZINE (APRESOLINE) 50 MG tablet TAKE 1 TABLET(50 MG) BY MOUTH EVERY 8 HOURS Patient taking differently: Take 50 mg by mouth 3 (three) times daily. 02/13/22   Werner Lean, MD  HYDROcodone-acetaminophen (NORCO/VICODIN) 5-325 MG tablet Take 1 tablet by mouth every 6 (six) hours as needed for pain. 02/24/22   [provider]  isosorbide mononitrate (IMDUR) 30 MG 24 hr tablet Take 0.5 tablets (15 mg total) by mouth daily. 12/13/21   Chandrasekhar, Lyda Kalata A, MD  oxybutynin (DITROPAN) 5 MG tablet Take 5 mg by mouth as needed for bladder spasms. 12/13/21   [provider]  oxyCODONE (ROXICODONE) 5 MG immediate release tablet Take 1 tablet (5 mg total) by mouth every 6 (six) hours as needed for severe pain. 02/06/22   Allred, Darrell K, PA-C  rosuvastatin (CRESTOR)  40 MG tablet Take 1 tablet (40 mg total) by mouth daily. 12/02/21   Rudean Haskell A, MD  sertraline (ZOLOFT) 50 MG tablet Take 50 mg by mouth daily.    [provider]  traZODone (DESYREL) 150 MG tablet Take 150 mg by mouth at bedtime. 08/24/20   [provider]      Allergies    Erythromycin and Lotensin [benazepril hcl]    Review of Systems   Review of Systems  Neurological:  Positive for weakness.    Physical Exam Updated Vital Signs BP (!) 122/56   Pulse 74   Temp 99.4 F (37.4 C)   Resp 20   Ht '5\' 3"'$  (1.6 m)   Wt 78 kg   SpO2 99%    BMI 30.46 kg/m  Physical Exam Vitals and nursing note reviewed.  Constitutional:      General: She is not in acute distress.    Appearance: She is well-developed. She is ill-appearing.  HENT:     Head: Normocephalic and atraumatic.     Mouth/Throat:     Mouth: Mucous membranes are dry.  Eyes:     Pupils: Pupils are equal, round, and reactive to light.  Cardiovascular:     Rate and Rhythm: Normal rate and regular rhythm.     Heart sounds: Normal heart sounds. No murmur heard.    No friction rub.  Pulmonary:     Effort: Pulmonary effort is normal.     Breath sounds: Normal breath sounds. No wheezing or rales.  Abdominal:     General: Bowel sounds are normal. There is no distension.     Palpations: Abdomen is soft.     Tenderness: There is no abdominal tenderness. There is left CVA tenderness. There is no guarding or rebound.  Musculoskeletal:        General: No tenderness. Normal range of motion.     Comments: No edema  Skin:    General: Skin is warm and dry.     Coloration: Skin is pale.     Findings: No rash.  Neurological:     Mental Status: She is alert and oriented to person, place, and time. Mental status is at baseline.     Cranial Nerves: No cranial nerve deficit.  Psychiatric:        Mood and Affect: Mood normal.        Behavior: Behavior normal.     ED Results / Procedures / Treatments   Labs (all labs ordered are listed, but only abnormal results are displayed) Labs Reviewed  LACTIC ACID, PLASMA - Abnormal; Notable for the following components:      Result Value   Lactic Acid, Venous 2.6 (*)    All other components within normal limits  COMPREHENSIVE METABOLIC PANEL - Abnormal; Notable for the following components:   Glucose, Bld 133 (*)    Creatinine, Ser 1.39 (*)    Calcium 8.7 (*)    Albumin 3.0 (*)    GFR, Estimated 40 (*)    All other components within normal limits  CBC WITH DIFFERENTIAL/PLATELET - Abnormal; Notable for the following components:    WBC 17.3 (*)    RBC 3.39 (*)    Hemoglobin 10.2 (*)    HCT 31.6 (*)    RDW 17.0 (*)    Neutro Abs 15.0 (*)    Monocytes Absolute 1.1 (*)    Abs Immature Granulocytes 0.10 (*)    All other components within normal limits  RESP PANEL BY  RT-PCR (FLU A&B, COVID) ARPGX2  CULTURE, BLOOD (ROUTINE X 2)  CULTURE, BLOOD (ROUTINE X 2)  URINE CULTURE  PROTIME-INR  APTT  LACTIC ACID, PLASMA  URINALYSIS, ROUTINE W REFLEX MICROSCOPIC    EKG EKG Interpretation  Date/Time:  Friday April 21 2022 18:33:24 EDT Ventricular Rate:  87 PR Interval:  156 QRS Duration: 90 QT Interval:  365 QTC Calculation: 440 R Axis:   28 Text Interpretation: Sinus rhythm RSR' in V1 or V2, probably normal variant Borderline repolarization abnormality No significant change since last tracing Confirmed by Blanchie Dessert [phone]) on 04/21/2022 7:15:06 PM  Radiology CT ABDOMEN PELVIS W CONTRAST  Result Date: 04/21/2022 CLINICAL DATA:  Sepsis Abdominal/flank pain, hematuria (Ped 0-17y). Prior right nephrectomy for urothelial carcinoma. EXAM: CT ABDOMEN AND PELVIS WITH CONTRAST TECHNIQUE: Multidetector CT imaging of the abdomen and pelvis was performed using the standard protocol following bolus administration of intravenous contrast. RADIATION DOSE REDUCTION: This exam was performed according to the departmental dose-optimization program which includes automated exposure control, adjustment of the mA and/or kV according to patient size and/or use of iterative reconstruction technique. CONTRAST:  59m OMNIPAQUE IOHEXOL 300 MG/ML  SOLN COMPARISON:  03/11/2022.  PET CT 04/05/2022 FINDINGS: Lower chest: No acute abnormality Hepatobiliary: No focal hepatic abnormality. Gallbladder unremarkable. Pancreas: No focal abnormality or ductal dilatation. Spleen: No focal abnormality.  Normal size. Adrenals/Urinary Tract: Prior right nephrectomy. Left ureteral stent remains in place. No hydronephrosis. There is mild left perinephric  stranding which is increased since prior study. Adrenal glands and urinary bladder unremarkable. Stomach/Bowel: Stomach, large and small bowel grossly unremarkable. Normal appendix. Vascular/Lymphatic: Heavily calcified aorta. No aneurysm. Mildly prominent left periaortic lymph nodes. Index node has a short axis diameter of 7 mm. None pathologically enlarged or changed since prior study. Reproductive: Prior hysterectomy.  No adnexal masses. Other: No free fluid or free air. Musculoskeletal: No acute bony abnormality. IMPRESSION: Left ureteral stent remains in place. There is mild left perinephric stranding which is increased since prior study. No hydronephrosis. Aortic atherosclerosis. Sigmoid diverticulosis. Electronically Signed   By: KRolm BaptiseM.D.   On: 04/21/2022 20:25   DG Chest Port 1 View  Result Date: 04/21/2022 CLINICAL DATA:  Questionable sepsis - evaluate for abnormality EXAM: PORTABLE CHEST 1 VIEW COMPARISON:  11/27/2018 FINDINGS: Heart and mediastinal contours are within normal limits. No focal opacities or effusions. No acute bony abnormality. IMPRESSION: No active disease. Electronically Signed   By: KRolm BaptiseM.D.   On: 04/21/2022 20:03    Procedures Procedures    Medications Ordered in ED Medications  lactated ringers infusion (has no administration in time range)  cefTRIAXone (ROCEPHIN) 2 g in sodium chloride 0.9 % 100 mL IVPB (0 g Intravenous Stopped 04/21/22 1936)  sodium chloride (PF) 0.9 % injection (has no administration in time range)  lactated ringers bolus 1,000 mL (0 mLs Intravenous Stopped 04/21/22 2008)  morphine (PF) 4 MG/ML injection 4 mg (4 mg Intravenous Given 04/21/22 1904)  ondansetron (ZOFRAN) injection 4 mg (4 mg Intravenous Given 04/21/22 1904)  acetaminophen (TYLENOL) tablet 1,000 mg (1,000 mg Oral Given 04/21/22 1858)  iohexol (OMNIPAQUE) 300 MG/ML solution 75 mL (75 mLs Intravenous Contrast Given 04/21/22 2009)  lactated ringers bolus 1,000 mL (1,000 mLs  Intravenous New Bag/Given 04/21/22 2021)    ED Course/ Medical Decision Making/ A&P                           Medical Decision Making Amount  and/or Complexity of Data Reviewed Independent Historian: EMS External Data Reviewed: notes.    Details: urology and pcp note Labs: ordered. Decision-making details documented in ED Course. Radiology: ordered and independent interpretation performed. Decision-making details documented in ED Course. ECG/medicine tests: ordered and independent interpretation performed. Decision-making details documented in ED Course.  Risk OTC drugs. Prescription drug management. Decision regarding hospitalization.   Pt with multiple medical problems and comorbidities and presenting today with a complaint that caries a high risk for morbidity and mortality.  Here today with concern for sepsis.  Patient is found to have a rectal temperature today of 102.5 with the history as listed above.  Patient is having left-sided flank pain.  Concern for pyelonephritis versus obstructive pathology that is led to an abscess or infected stent.  Patient does not have significant abdominal pain so lower suspicion for diverticulitis, appendicitis.  She is not having cough congestion or shortness of breath and low suspicion for pneumonia at this time.  Patient was given IV fluid, pain control, antiemetics.  Labs are pending.  9:15 PM I independently interpreted patient's labs and EKG.  EKG without acute findings, CBC with leukocytosis of 17 with stable hemoglobin, CMP with stable renal function at 1.39 which is at her baseline with normal electrolytes, lactic acid is elevated today at 2.6.  Patient received a total of 2 L of fluid which is close to 30/kg.  There is no evidence of septic shock at this time.  She was covered with Rocephin for concern for pyelonephritis. I have independently visualized and interpreted pt's images today.  CT abdomen pelvis does show a stent on the left and an  absent kidney on the right.  Radiology reported stranding around the left kidney which I am concerned is pyelonephritis.  Patient will be admitted for further care and the hospitalist was consulted.  On repeat evaluation patient feels better has no questions at this time.  Findings discussed with the patient and her husband.          Final Clinical Impression(s) / ED Diagnoses Final diagnoses:  Pyelonephritis  Sepsis without acute organ dysfunction, due to unspecified organism [facility])    Rx / DC Orders ED Discharge Orders     None         Blanchie Dessert, MD 04/21/22 2115

## 2022-04-21 NOTE — Sepsis Progress Note (Signed)
Monitoring for the code sepsis protocol. °

## 2022-04-21 NOTE — H&P (Signed)
History and Physical    JAEDA BRUSO WYO:[id number] DOB: May 05, 1946 DOA: 04/21/2022  PCP: [name], MD  Patient coming from: home  I have personally briefly reviewed patient's old medical records in [place]  Chief Complaint: fever , n/v generalized weakness  HPI: [name] is a 76 y.o. female with medical history significant of moderate nonobstructive CAD (70% Diag disease 11/05/20), HFmrEF, HTN ,CKDIII,depression , GERD, urothelial carcinoma s/p ureterectomy further complicated by recurrence on left for which she is followed by urology and oncology. Patient has recent interim history of left double J stent replacement on 03/09/2022. Place of tube was complicated by uti for which patient states she has been on two round of antibiotics since that time. Further interim history notes last chemo therapy 2 weeks ago. Patient per husband had three treatments thus far , however therapy discontinued due to patient not being able to tolerate treatment.  Patient now presents to ED with fever/n/v/ generalized weakness leading to fall w/o LOC or head trauma for further evaluation. Patient husband states that nausea and vomiting started last pm and progressed. He states also noted increase generalized weakness and which this had fall at home. Due to progression of symptoms with fall EMS was called. Patient in ed endorse no HA/vision changes or focal weakness. She notes no chest pain , or sob but does endorse few weeks of cough.  She notes no diarrhea and noted continue back/flank left sided pain  as well as suprapubic discomfort.   ED Course:  Temp 99.5-102, bp 162/72, hr 87, rr 26, sat 87% on ra NSR: no st -t wave changes  Labs:  Wbc 17, hgb 10.2( 10.9),plt 261, inr 1.2 Na: 139, K 3.6, CL 100, Glu 133, cr 1.39 at basline Lactic 2.6 Cxr NAD CT abd:  IMPRESSION: Left ureteral stent remains in place. There is mild left perinephric stranding which is increased since prior  study. No hydronephrosis.   Aortic atherosclerosis.   Sigmoid diverticulosis. Tx 1gram tylenol,ctx 2gram,zofran, morphine'4mg'$  iv x 1,LR1L Review of Systems: As per HPI otherwise 10 point review of systems negative.   Past Medical History:  Diagnosis Date   Anxiety    Bilateral swelling of feet 03/11/2022   bilateral feet edema much improved per pt on 03-20-2022, bilateral foot edema resolves after propping both feet up   CAD in native artery    a. cath 10/2019- mild to moderate dx >> medical therapy    Cancer [facility])    left ureteral cancer dx sept 2020   CHF (congestive heart failure) ([place])    Chronic kidney disease 06/2019   acute renal insufficiency sees dr Dorina Hoyer, ckd stage 3 per pt   COVID 09/2020   tired sore throat nausea hair fell out loss of taste and smell x 10 days, all symptoms resolved except still has loss of taste and smell   Depression    GERD (gastroesophageal reflux disease)    Hyperlipidemia    Hypertension     Past Surgical History:  Procedure Laterality Date   ABDOMINAL HYSTERECTOMY     partial   bladder-partial removal     CYSTOSCOPY W/ URETERAL STENT PLACEMENT Left 11/17/2019   Procedure: CYSTOSCOPY WITH STENT REPLACEMENT;  Surgeon: Franchot Gallo, MD;  Location: [facility];  Service: Urology;  Laterality: Left;   CYSTOSCOPY W/ URETERAL STENT PLACEMENT Left 03/23/2022   Procedure: CYSTOSCOPY WITH RETROGRADE PYELOGRAM, LEFT URETERAL STENT PLACEMENT;  Surgeon: Franchot Gallo, MD;  Location: [facility];  Service: Urology;  Laterality: Left;   CYSTOSCOPY W/ URETERAL STENT REMOVAL Left 05/10/2020   Procedure: CYSTOSCOPY WITH STENT REMOVAL;  Surgeon: Franchot Gallo, MD;  Location: [facility];  Service: Urology;  Laterality: Left;   CYSTOSCOPY W/ URETERAL STENT REMOVAL Left 05/02/2021   Procedure: CYSTOSCOPY WITH STENT REMOVAL;  Surgeon: Franchot Gallo, MD;  Location: [facility];  Service:  Urology;  Laterality: Left;   CYSTOSCOPY WITH RETROGRADE PYELOGRAM, URETEROSCOPY AND STENT PLACEMENT Left 06/27/2019   Procedure: CYSTOSCOPY WITH LEFT RETROGRADE PYELOGRAM, URETEROSCOPY, BIOPSY AND LEFT STENT PLACEMENT;  Surgeon: Irine Seal, MD;  Location: [facility];  Service: Urology;  Laterality: Left;   CYSTOSCOPY WITH RETROGRADE PYELOGRAM, URETEROSCOPY AND STENT PLACEMENT Left 08/18/2019   Procedure: CYSTOSCOPY, URETEROSCOPY AND STENT EXCHANGE;  Surgeon: Franchot Gallo, MD;  Location: [facility];  Service: Urology;  Laterality: Left;  77 MINS   CYSTOSCOPY WITH RETROGRADE PYELOGRAM, URETEROSCOPY AND STENT PLACEMENT Left 11/17/2019   Procedure: CYSTOSCOPY WITH RETROGRADE PYELOGRAM, URETEROSCOPY AND STENT PLACEMENT;  Surgeon: Franchot Gallo, MD;  Location: [facility];  Service: Urology;  Laterality: Left;  99 MINS   CYSTOSCOPY WITH RETROGRADE PYELOGRAM, URETEROSCOPY AND STENT PLACEMENT Left 05/10/2020   Procedure: CYSTOSCOPY WITH RETROGRADE PYELOGRAM, URETEROSCOPY AND STENT PLACEMENT WITH URETHRAL DIALATION AND BRUSH BIOPSY;  Surgeon: Franchot Gallo, MD;  Location: [facility];  Service: Urology;  Laterality: Left;  1 HR   CYSTOSCOPY WITH RETROGRADE PYELOGRAM, URETEROSCOPY AND STENT PLACEMENT Left 09/16/2020   Procedure: CYSTOSCOPY WITH RETROGRADE PYELOGRAM, URETEROSCOPY ,  LITHOPAXY, LEFT URETERAL BRUSHING, AND STENT REPLACEMENT;  Surgeon: Franchot Gallo, MD;  Location: [facility];  Service: Urology;  Laterality: Left;   CYSTOSCOPY WITH RETROGRADE PYELOGRAM, URETEROSCOPY AND STENT PLACEMENT Left 10/24/2021   Procedure: CYSTOSCOPY WITH RETROGRADE PYELOGRAM, URETEROSCOPY, POSSIBLE URETERAL AND RENAL BIOPSIES AND STENT PLACEMENT;  Surgeon: Franchot Gallo, MD;  Location: [facility];  Service: Urology;  Laterality: Left;  1 HR   CYSTOSCOPY WITH RETROGRADE PYELOGRAM, URETEROSCOPY AND STENT PLACEMENT Left 03/09/2022   Procedure: CYSTOSCOPY  WITH ANTEGRADE PYELOGRAM,  STENT REMOVAL;  Surgeon: Franchot Gallo, MD;  Location: [facility];  Service: Urology;  Laterality: Left;   CYSTOSCOPY/URETEROSCOPY/HOLMIUM LASER/STENT PLACEMENT Left 05/02/2021   Procedure: CYSTOSCOPY/URETEROSCOPY WITH BRUSH BIOPSY/ RETROGRADE PYELOGRAM/ HOLMIUM LASER/STENT REPLACEMENT;  Surgeon: Franchot Gallo, MD;  Location: [facility];  Service: Urology;  Laterality: Left;   HOLMIUM LASER APPLICATION Left 73/01/1936   Procedure: HOLMIUM LASER APPLICATION;  Surgeon: Franchot Gallo, MD;  Location: [facility];  Service: Urology;  Laterality: Left;   HOLMIUM LASER APPLICATION Left 06/10/4096   Procedure: HOLMIUM LASER APPLICATION OF TUMORS;  Surgeon: Franchot Gallo, MD;  Location: [facility];  Service: Urology;  Laterality: Left;   IR NEPHROSTOMY PLACEMENT LEFT  07/06/2021   IR NEPHROSTOMY PLACEMENT LEFT  02/06/2022   kidney removed  2006   right    NEPHROSTOMY TUBE REMOVAL Left 03/23/2022   Procedure: NEPHROSTOMY TUBE REMOVAL;  Surgeon: Franchot Gallo, MD;  Location: [facility];  Service: Urology;  Laterality: Left;   RIGHT/LEFT HEART CATH AND CORONARY ANGIOGRAPHY N/A 11/06/2019   Procedure: RIGHT/LEFT HEART CATH AND CORONARY ANGIOGRAPHY;  Surgeon: Belva Crome, MD;  Location: [facility];  Service: Cardiovascular;  Laterality: N/A;   THULIUM LASER TURP (TRANSURETHRAL RESECTION OF PROSTATE) Left 08/18/2019   Procedure: THULIUM LASER ABLATION OF URETERAL TUMOR;  Surgeon: Franchot Gallo, MD;  Location: [facility];  Service: Urology;  Laterality: Left;   THULIUM LASER TURP (TRANSURETHRAL RESECTION OF PROSTATE) Left 11/17/2019   Procedure: THULIUM LASER of URETERAL CANCER;  Surgeon: Franchot Gallo, MD;  Location: [facility];  Service: Urology;  Laterality: Left;     reports that she quit smoking about 18 years ago. Her smoking use included cigarettes. She  has a 19.00 pack-year smoking history. She has never used smokeless tobacco. She reports that she does not drink alcohol and does not use drugs.  Allergies  Allergen Reactions   Erythromycin Diarrhea and Nausea And Vomiting   Lotensin [Benazepril Hcl] Cough    Family History  Problem Relation Age of Onset   Hypertension Mother    Diabetes Mellitus II Sister    Hypertension Sister    Hypertension Brother     Prior to Admission medications   Medication Sig Start Date End Date Taking? Authorizing Provider  acetaminophen (TYLENOL) 500 MG tablet Take 500 mg by mouth every 6 (six) hours as needed for moderate pain.    [provider]  aspirin EC 81 MG tablet Take 1 tablet (81 mg total) by mouth daily. 11/03/19   Nahser, Wonda Cheng, MD  calcium carbonate (TUMS - DOSED IN MG ELEMENTAL CALCIUM) 500 MG chewable tablet Chew 1 tablet by mouth every morning.    [provider]  carvedilol (COREG) 6.25 MG tablet TAKE 1 TABLET(6.25 MG) BY MOUTH TWICE DAILY WITH A MEAL Patient taking differently: Take 6.25 mg by mouth 2 (two) times daily with a meal. 11/22/21   Chandrasekhar, Mahesh A, MD  cholecalciferol (VITAMIN D3) 25 MCG (1000 UNIT) tablet Take 1,000 Units by mouth daily.    [provider]  doxycycline (VIBRA-TABS) 100 MG tablet Take 100 mg by mouth daily. Per patient - no stop date provided by the physician 02/08/22   [provider]  ezetimibe (ZETIA) 10 MG tablet Take 1 tablet (10 mg total) by mouth daily. Patient not taking: Reported on 03/11/2022 12/21/21   Rudean Haskell A, MD  hydrALAZINE (APRESOLINE) 50 MG tablet TAKE 1 TABLET(50 MG) BY MOUTH EVERY 8 HOURS Patient taking differently: Take 50 mg by mouth 3 (three) times daily. 02/13/22   Werner Lean, MD  HYDROcodone-acetaminophen (NORCO/VICODIN) 5-325 MG tablet Take 1 tablet by mouth every 6 (six) hours as needed for pain. 02/24/22   [provider]  isosorbide mononitrate (IMDUR) 30 MG 24  hr tablet Take 0.5 tablets (15 mg total) by mouth daily. 12/13/21   Chandrasekhar, Lyda Kalata A, MD  oxybutynin (DITROPAN) 5 MG tablet Take 5 mg by mouth as needed for bladder spasms. 12/13/21   [provider]  oxyCODONE (ROXICODONE) 5 MG immediate release tablet Take 1 tablet (5 mg total) by mouth every 6 (six) hours as needed for severe pain. 02/06/22   Allred, Darrell K, PA-C  rosuvastatin (CRESTOR) 40 MG tablet Take 1 tablet (40 mg total) by mouth daily. 12/02/21   Rudean Haskell A, MD  sertraline (ZOLOFT) 50 MG tablet Take 50 mg by mouth daily.    [provider]  traZODone (DESYREL) 150 MG tablet Take 150 mg by mouth at bedtime. 08/24/20   [provider]    Physical Exam: Vitals:   04/21/22 1933 04/21/22 2000 04/21/22 2030 04/21/22 2100  BP:  (!) 126/45 (!) 121/53 (!) 122/56  Pulse:  72 72 66  Resp:  '14 18 19  '$ Temp:      TempSrc:      SpO2:  96% 97% 100%  Weight: 78 kg     Height: '5\' 3"'$  (1.6 m)        Vitals:   04/21/22 1933 04/21/22 2000 04/21/22 2030 04/21/22 2100  BP:  (!) 126/45 (!) 121/53 (!) 122/56  Pulse:  72 72 66  Resp:  '14 18 19  '$ Temp:      TempSrc:      SpO2:  96% 97% 100%  Weight: 78 kg     Height: '5\' 3"'$  (1.6 m)     Constitutional: NAD, calm, comfortable Eyes: PERRL, lids and conjunctivae normal ENMT: Mucous membranes are moist. Posterior pharynx clear of any exudate or lesions.Normal dentition.  Neck: normal, supple, no masses, no thyromegaly Respiratory: clear to auscultation bilaterally, no wheezing, no crackles. Normal respiratory effort. No accessory muscle use.  Cardiovascular: Regular rate and rhythm, no murmurs / rubs / gallops. No extremity edema. 2+ pedal pulses.  Abdomen: +suprapubic tenderness, no masses palpated. No hepatosplenomegaly. Bowel sounds positive.  Musculoskeletal: no clubbing / cyanosis. No joint deformity upper and lower extremities. Good ROM, no contractures. Normal muscle tone.  Skin: no rashes, lesions,  ulcers. No induration Neurologic: CN 2-12 grossly intact. Sensation intact,  Strength 5/5 in all 4.  Psychiatric: Normal judgment and insight. Alert and oriented x 3. Normal mood.    Labs on Admission: I have personally reviewed following labs and imaging studies  CBC: Recent Labs  Lab 04/21/22 1924  WBC 17.3*  NEUTROABS 15.0*  HGB 10.2*  HCT 31.6*  MCV 93.2  PLT 564   Basic Metabolic Panel: Recent Labs  Lab 04/21/22 1924  NA 139  K 3.6  CL 100  CO2 30  GLUCOSE 133*  BUN 12  CREATININE 1.39*  CALCIUM 8.7*   GFR: Estimated Creatinine Clearance: 35.6 mL/min (A) (by C-G formula based on SCr of 1.39 mg/dL (H)). Liver Function Tests: Recent Labs  Lab 04/21/22 1924  AST 19  ALT 13  ALKPHOS 50  BILITOT 0.5  PROT 7.2  ALBUMIN 3.0*   No results for input(s): "LIPASE", "AMYLASE" in the last 168 hours. No results for input(s): "AMMONIA" in the last 168 hours. Coagulation Profile: Recent Labs  Lab 04/21/22 1924  INR 1.2   Cardiac Enzymes: No results for input(s): "CKTOTAL", "CKMB", "CKMBINDEX", "TROPONINI" in the last 168 hours. BNP (last 3 results) Recent Labs    08/10/21 0835  PROBNP 157   HbA1C: No results for input(s): "HGBA1C" in the last 72 hours. CBG: No results for input(s): "GLUCAP" in the last 168 hours. Lipid Profile: No results for input(s): "CHOL", "HDL", "LDLCALC", "TRIG", "CHOLHDL", "LDLDIRECT" in the last 72 hours. Thyroid Function Tests: No results for input(s): "TSH", "T4TOTAL", "FREET4", "T3FREE", "THYROIDAB" in the last 72 hours. Anemia Panel: No results for input(s): "VITAMINB12", "FOLATE", "FERRITIN", "TIBC", "IRON", "RETICCTPCT" in the last 72 hours. Urine analysis:    Component Value Date/Time   COLORURINE YELLOW 03/11/2022 1650   APPEARANCEUR CLEAR 03/11/2022 1650   LABSPEC 1.011 03/11/2022 1650   PHURINE 6.0 03/11/2022 1650   GLUCOSEU NEGATIVE 03/11/2022 1650   HGBUR MODERATE (A) 03/11/2022 1650   BILIRUBINUR NEGATIVE  03/11/2022 1650   KETONESUR NEGATIVE 03/11/2022 1650   PROTEINUR 100 (A) 03/11/2022 1650   NITRITE NEGATIVE 03/11/2022 1650   LEUKOCYTESUR SMALL (A) 03/11/2022 1650    Radiological Exams on Admission: CT ABDOMEN PELVIS W CONTRAST  Result Date: 04/21/2022 CLINICAL DATA:  Sepsis Abdominal/flank pain, hematuria (Ped 0-17y). Prior right nephrectomy for urothelial carcinoma. EXAM: CT ABDOMEN AND PELVIS WITH CONTRAST TECHNIQUE: Multidetector CT  imaging of the abdomen and pelvis was performed using the standard protocol following bolus administration of intravenous contrast. RADIATION DOSE REDUCTION: This exam was performed according to the departmental dose-optimization program which includes automated exposure control, adjustment of the mA and/or kV according to patient size and/or use of iterative reconstruction technique. CONTRAST:  38m OMNIPAQUE IOHEXOL 300 MG/ML  SOLN COMPARISON:  03/11/2022.  PET CT 04/05/2022 FINDINGS: Lower chest: No acute abnormality Hepatobiliary: No focal hepatic abnormality. Gallbladder unremarkable. Pancreas: No focal abnormality or ductal dilatation. Spleen: No focal abnormality.  Normal size. Adrenals/Urinary Tract: Prior right nephrectomy. Left ureteral stent remains in place. No hydronephrosis. There is mild left perinephric stranding which is increased since prior study. Adrenal glands and urinary bladder unremarkable. Stomach/Bowel: Stomach, large and small bowel grossly unremarkable. Normal appendix. Vascular/Lymphatic: Heavily calcified aorta. No aneurysm. Mildly prominent left periaortic lymph nodes. Index node has a short axis diameter of 7 mm. None pathologically enlarged or changed since prior study. Reproductive: Prior hysterectomy.  No adnexal masses. Other: No free fluid or free air. Musculoskeletal: No acute bony abnormality. IMPRESSION: Left ureteral stent remains in place. There is mild left perinephric stranding which is increased since prior study. No  hydronephrosis. Aortic atherosclerosis. Sigmoid diverticulosis. Electronically Signed   By: KRolm BaptiseM.D.   On: 04/21/2022 20:25   DG Chest Port 1 View  Result Date: 04/21/2022 CLINICAL DATA:  Questionable sepsis - evaluate for abnormality EXAM: PORTABLE CHEST 1 VIEW COMPARISON:  11/27/2018 FINDINGS: Heart and mediastinal contours are within normal limits. No focal opacities or effusions. No acute bony abnormality. IMPRESSION: No active disease. Electronically Signed   By: KRolm BaptiseM.D.   On: 04/21/2022 20:03    EKG: Independently reviewed. See above   Assessment/Plan  Acute Left  pyelonephritis  -in background of urothelial ca , recent urology instrumentation with stent removal and replaced over one mo ago  - note associated fever/wbc/ increase lactice : sepsis criteria  - admit to progressive care  - continue with ctx 2gram  -consider urology consult in am if patient does no improve -f/u urine /blood cultures    New Hypoxemia  -cxr negative  - cta to r/o pe  -abg /bnp   Urothelial CA  - followed by urology and oncology  -most recent chemo 2 weeks ago , further chemo deferred due to patient inability to tolerate treatment    CKDIII -at baseline  Coronary Artery Disease:  -no acute issues followed by cardiology  -continue ASA ,coreg, and imdur   Heart Failure Recovered Ejection Fraction  -continue bb  -note to be euvolemic    HTN -continue home regimen   HLD -  continue rosuvastatin and zetia  GERD -ppi   Depression anxiety  -resume ssri  DVT prophylaxis: heparin Code Status: FULL Family Communication:  Disposition Plan: patient  expected to be admitted greater than 2 midnights  Consults called: n/a Admission status: progressive care   SClance BollMD [facility]   If 7PM-7AM, please contact night-coverage [email or web address] Password [email or web address] 04/21/2022, 9:07 PM

## 2022-04-21 NOTE — ED Triage Notes (Signed)
Pt BIB EMS from home, c/o generalized weakness related to N/V. Pt endorses falling around 5pm, denies injury or hitting head. Nephro tube removed, pt unable to complete chemo treatments x 2 weeks. Hx of cancer, A&O x4, denies pain. 20 LAC, 250 NS  BP 160/60 P 90 98% 2 liters RR 24

## 2022-04-22 ENCOUNTER — Inpatient Hospital Stay (HOSPITAL_COMMUNITY): Payer: Medicare PPO

## 2022-04-22 ENCOUNTER — Encounter (HOSPITAL_COMMUNITY): Payer: Self-pay | Admitting: Internal Medicine

## 2022-04-22 DIAGNOSIS — I5020 Unspecified systolic (congestive) heart failure: Secondary | ICD-10-CM

## 2022-04-22 DIAGNOSIS — A419 Sepsis, unspecified organism: Secondary | ICD-10-CM | POA: Diagnosis not present

## 2022-04-22 DIAGNOSIS — C689 Malignant neoplasm of urinary organ, unspecified: Secondary | ICD-10-CM | POA: Diagnosis present

## 2022-04-22 DIAGNOSIS — I2699 Other pulmonary embolism without acute cor pulmonale: Secondary | ICD-10-CM | POA: Diagnosis present

## 2022-04-22 DIAGNOSIS — N12 Tubulo-interstitial nephritis, not specified as acute or chronic: Secondary | ICD-10-CM | POA: Diagnosis not present

## 2022-04-22 DIAGNOSIS — J9601 Acute respiratory failure with hypoxia: Secondary | ICD-10-CM

## 2022-04-22 LAB — BLOOD CULTURE ID PANEL (REFLEXED) - BCID2

## 2022-04-22 LAB — URINALYSIS, ROUTINE W REFLEX MICROSCOPIC
Bilirubin Urine: NEGATIVE
Glucose, UA: NEGATIVE mg/dL
Ketones, ur: NEGATIVE mg/dL
Nitrite: NEGATIVE
Protein, ur: 30 mg/dL — AB
Specific Gravity, Urine: >1.03 — ABNORMAL HIGH (ref 1.005–1.030)
WBC, UA: >50 WBC/hpf — ABNORMAL HIGH (ref 0–5)
pH: 5 (ref 5.0–8.0)

## 2022-04-22 LAB — HEMOGLOBIN A1C
Hgb A1c MFr Bld: 6.1 % — ABNORMAL HIGH (ref 4.8–5.6)
Mean Plasma Glucose: 128.37 mg/dL

## 2022-04-22 LAB — CBC
HCT: 27.4 % — ABNORMAL LOW (ref 36.0–46.0)
Hemoglobin: 8.7 g/dL — ABNORMAL LOW (ref 12.0–15.0)
MCH: 29.6 pg (ref 26.0–34.0)
MCHC: 31.8 g/dL (ref 30.0–36.0)
MCV: 93.2 fL (ref 80.0–100.0)
Platelets: 188 10*3/uL (ref 150–400)
RBC: 2.94 MIL/uL — ABNORMAL LOW (ref 3.87–5.11)
RDW: 17.2 % — ABNORMAL HIGH (ref 11.5–15.5)
WBC: 16.3 10*3/uL — ABNORMAL HIGH (ref 4.0–10.5)
nRBC: 0 % (ref 0.0–0.2)

## 2022-04-22 LAB — COMPREHENSIVE METABOLIC PANEL
ALT: 18 U/L (ref 0–44)
AST: 51 U/L — ABNORMAL HIGH (ref 15–41)
Albumin: 2.5 g/dL — ABNORMAL LOW (ref 3.5–5.0)
Alkaline Phosphatase: 44 U/L (ref 38–126)
Anion gap: 9 (ref 5–15)
BUN: 14 mg/dL (ref 8–23)
CO2: 27 mmol/L (ref 22–32)
Calcium: 7.8 mg/dL — ABNORMAL LOW (ref 8.9–10.3)
Chloride: 101 mmol/L (ref 98–111)
Creatinine, Ser: 1.29 mg/dL — ABNORMAL HIGH (ref 0.44–1.00)
GFR, Estimated: 44 mL/min — ABNORMAL LOW (ref >60)
Glucose, Bld: 113 mg/dL — ABNORMAL HIGH (ref 70–99)
Potassium: 3.7 mmol/L (ref 3.5–5.1)
Sodium: 137 mmol/L (ref 135–145)
Total Bilirubin: 0.6 mg/dL (ref 0.3–1.2)
Total Protein: 5.9 g/dL — ABNORMAL LOW (ref 6.5–8.1)

## 2022-04-22 LAB — CBG MONITORING, ED: Glucose-Capillary: 116 mg/dL — ABNORMAL HIGH (ref 70–99)

## 2022-04-22 LAB — LACTIC ACID, PLASMA: Lactic Acid, Venous: 1.3 mmol/L (ref 0.5–1.9)

## 2022-04-22 LAB — C-REACTIVE PROTEIN: CRP: 16.2 mg/dL — ABNORMAL HIGH (ref <1.0)

## 2022-04-22 MED ORDER — ACETAMINOPHEN 500 MG PO TABS
500.0000 mg | ORAL_TABLET | Freq: Once | ORAL | Status: AC
  Administered 2022-04-22: 500 mg via ORAL
  Filled 2022-04-22: qty 1

## 2022-04-22 MED ORDER — APIXABAN 5 MG PO TABS: 5.0000 mg | ORAL_TABLET | Freq: Two times a day (BID) | ORAL | Status: DC

## 2022-04-22 MED ORDER — SODIUM CHLORIDE 0.9 % IV BOLUS
500.0000 mL | Freq: Once | INTRAVENOUS | Status: AC
  Administered 2022-04-22: 500 mL via INTRAVENOUS

## 2022-04-22 MED ORDER — IOHEXOL 350 MG/ML SOLN
75.0000 mL | Freq: Once | INTRAVENOUS | Status: AC | PRN
  Administered 2022-04-22: 75 mL via INTRAVENOUS

## 2022-04-22 MED ORDER — APIXABAN 5 MG PO TABS
10.0000 mg | ORAL_TABLET | Freq: Two times a day (BID) | ORAL | Status: DC
  Administered 2022-04-22 – 2022-04-26 (×9): 10 mg via ORAL
  Filled 2022-04-22 (×9): qty 2

## 2022-04-22 MED ORDER — TRAZODONE HCL 50 MG PO TABS
50.0000 mg | ORAL_TABLET | Freq: Every evening | ORAL | Status: DC | PRN
  Administered 2022-04-22 – 2022-04-25 (×4): 50 mg via ORAL
  Filled 2022-04-22 (×4): qty 1

## 2022-04-22 MED ORDER — VANCOMYCIN HCL 1500 MG/300ML IV SOLN
1500.0000 mg | INTRAVENOUS | Status: AC
  Administered 2022-04-22: 1500 mg via INTRAVENOUS
  Filled 2022-04-22: qty 300

## 2022-04-22 MED ORDER — VANCOMYCIN HCL 750 MG/150ML IV SOLN: 750.0000 mg | INTRAVENOUS | Status: DC

## 2022-04-22 NOTE — ED Notes (Signed)
Pt received breakfast tray 

## 2022-04-22 NOTE — ED Notes (Signed)
CBG 116  

## 2022-04-22 NOTE — Progress Notes (Signed)
Pharmacy Antibiotic Note  [name] is a 76 y.o. female admitted on 04/21/2022 with UTI. BCID+ 2/3 MRSE. Pharmacy has been consulted for vancomycin dosing.  Today, 04/22/22 WBC remains elevated SCr 1.3, CrCl ~38 mL/min PCT 0.25 Tmax 103.1 F  Plan: Ceftriaxone for UTI Vancomycin 1500 mg IV LD followed by 750 mg IV q24 for estimated AUC of 432 Goal vancomycin AUC 400-550. Check levels at steady state as needed Monitor renal function, culture data  Height: '5\' 3"'$  (160 cm) Weight: 78 kg (171 lb 15.3 oz) IBW/kg (Calculated) : 52.4  Temp (24hrs), Avg:100.2 F (37.9 C), Min:98.8 F (37.1 C), Max:103.1 F (39.5 C)  Recent Labs  Lab 04/21/22 1924 04/21/22 1925 04/21/22 2132 04/21/22 2139 04/21/22 2220 04/22/22 0139 04/22/22 0500  WBC 17.3*  --   --  16.0*  --   --  16.3*  CREATININE 1.39*  --   --  1.26*  --   --  1.29*  LATICACIDVEN  --  2.6* 1.4  --  1.0 1.3  --     Estimated Creatinine Clearance: 38.4 mL/min (A) (by C-G formula based on SCr of 1.29 mg/dL (H)).    Allergies  Allergen Reactions   Erythromycin Diarrhea and Nausea And Vomiting   Lotensin [Benazepril Hcl] Cough    Antimicrobials this admission: ceftriaxone 7/14 >>  vancomycin 7/15 >>   Dose adjustments this admission:  Microbiology results: 7/14 BCx: 2/3 MRSE 7/14 UCx:    Lenis Noon, PharmD 04/22/2022 7:26 PM

## 2022-04-22 NOTE — Discharge Instructions (Signed)
Information on my medicine - ELIQUIS (apixaban)  Why was Eliquis prescribed for you? Eliquis was prescribed to treat blood clots that may have been found in the veins of your legs (deep vein thrombosis) or in your lungs (pulmonary embolism) and to reduce the risk of them occurring again.  What do You need to know about Eliquis ? The starting dose is 10 mg (two 5 mg tablets) taken TWICE daily for the FIRST SEVEN (7) DAYS, then on (enter date)  04/29/22  the dose is reduced to ONE 5 mg tablet taken TWICE daily.  Eliquis may be taken with or without food.   Try to take the dose about the same time in the morning and in the evening. If you have difficulty swallowing the tablet whole please discuss with your pharmacist how to take the medication safely.  Take Eliquis exactly as prescribed and DO NOT stop taking Eliquis without talking to the doctor who prescribed the medication.  Stopping may increase your risk of developing a new blood clot.  Refill your prescription before you run out.  After discharge, you should have regular check-up appointments with your healthcare provider that is prescribing your Eliquis.    What do you do if you miss a dose? If a dose of ELIQUIS is not taken at the scheduled time, take it as soon as possible on the same day and twice-daily administration should be resumed. The dose should not be doubled to make up for a missed dose.  Important Safety Information A possible side effect of Eliquis is bleeding. You should call your healthcare provider right away if you experience any of the following: Bleeding from an injury or your nose that does not stop. Unusual colored urine (red or dark brown) or unusual colored stools (red or black). Unusual bruising for unknown reasons. A serious fall or if you hit your head (even if there is no bleeding).  Some medicines may interact with Eliquis and might increase your risk of bleeding or clotting while on Eliquis. To  help avoid this, consult your healthcare provider or pharmacist prior to using any new prescription or non-prescription medications, including herbals, vitamins, non-steroidal anti-inflammatory drugs (NSAIDs) and supplements.  This website has more information on Eliquis (apixaban): http://www.eliquis.com/eliquis/home

## 2022-04-22 NOTE — Progress Notes (Addendum)
Progress Note    CIERAH CRADER  HAL:[id number] DOB: [birth year]  DOA: 04/21/2022 PCP: [name], MD      Brief Narrative:    Medical records reviewed and are as summarized below:  CYANNA NEACE is a 76 y.o. female with medical history significant of moderate nonobstructive CAD (70% Diag disease 11/05/20), HFmrEF, HTN ,CKDIII,depression , GERD, urothelial carcinoma s/p ureterectomy further complicated by recurrence on left for which she is followed by urology and oncology.  Left ureteral stent was removed on 03/09/2022 and left ureteral stent was placed on 03/23/2022.  She said she has had 2 rounds of antibiotics for UTIs since that time. Further interim history notes last chemo therapy 2 weeks ago.  She said chemotherapy has been discontinued because of intolerance to treatment.  She presented to the hospital because of fever, nausea, vomiting, cough, left flank pain, generalized weakness and a fall at home..      Assessment/Plan:   Principal Problem:   Severe sepsis (HCC) Active Problems:   Acute respiratory failure with hypoxia (HCC)   HFrEF (heart failure with reduced ejection fraction) (HCC)   Pyelonephritis   Acute pulmonary embolism (HCC)   Urothelial carcinoma (HCC)   Body mass index is 30.46 kg/m.  (Obesity)   Fever, severe sepsis secondary to acute left pyelonephritis, recent left ureteral stent placement on 03/23/2022, recent use of antibiotics for complicated UTI: Lactic acid was 2.6.  Continue IV ceftriaxone.  Analgesics as needed for pain.  Discontinue IV fluids to avoid fluid overload.  Follow-up urine and blood cultures.  Acute right-sided pulmonary embolism: Risks, benefits and alternatives to long-term anticoagulation were discussed with the patient and she is agreeable to proceed with Eliquis.  Start Eliquis.  Acute hypoxic respiratory failure: Oxygen saturation dropped to 78%.  Continue 2 L/min oxygen via nasal cannula.  Taper off oxygen  as able.  Urothelial carcinoma: Most recent chemotherapy was about 2 weeks prior to admission.  Patient has had treatment has been discontinued because of intolerance to treatment.  Patient follow-up with urologist and oncologist.  Chronic diastolic CHF: Compensated.  2D echo in May 2022 showed EF estimated at 70 to 73%, grade 1 diastolic dysfunction.  Other comorbidities include hypertension, hyperlipidemia, GERD, depression, anxiety  Diet Order             Diet Heart Room service appropriate? Yes; Fluid consistency: Thin  Diet effective now                            Consultants: None  Procedures: None    Medications:    apixaban  10 mg Oral BID   Followed by   Derrill Memo ON 04/29/2022] apixaban  5 mg Oral BID   aspirin EC  81 mg Oral Daily   calcium carbonate  1 tablet Oral q morning   carvedilol  6.25 mg Oral BID WC   cholecalciferol  1,000 Units Oral Daily   docusate sodium  100 mg Oral BID   hydrALAZINE  50 mg Oral TID   isosorbide mononitrate  15 mg Oral Daily   rosuvastatin  40 mg Oral Daily   sertraline  50 mg Oral Daily   Continuous Infusions:  cefTRIAXone (ROCEPHIN)  IV Stopped (04/21/22 1936)   lactated ringers Stopped (04/21/22 2244)     Anti-infectives (From admission, onward)    Start     Dose/Rate Route Frequency Ordered Stop   04/21/22 1900  cefTRIAXone (ROCEPHIN) 2 g in sodium chloride 0.9 % 100 mL IVPB        2 g 200 mL/hr over 30 Minutes Intravenous Every 24 hours 04/21/22 1848 04/28/22 1859              Family Communication/Anticipated D/C date and plan/Code Status   DVT prophylaxis:  apixaban (ELIQUIS) tablet 10 mg  apixaban (ELIQUIS) tablet 5 mg     Code Status: Full Code  Family Communication: None Disposition Plan: Plan to discharge home in 2 to 3 days   Status is: Inpatient Remains inpatient appropriate because: On IV antibiotics       Subjective:   Interval events noted.  She had fever with Tmax of  103.1 F overnight.  She complains of left flank pain  Objective:    Vitals:   04/22/22 1130 04/22/22 1323 04/22/22 1415 04/22/22 1428  BP: (!) 121/94 (!) 98/47 (!) 103/42   Pulse: 75 81 81 79  Resp: 19 (!) 21 (!) 25 20  Temp: 98.8 F (37.1 C)  (!) 100.7 F (38.2 C)   TempSrc: Oral  Oral   SpO2: 98% 93% 90% 96%  Weight:      Height:       No data found.  No intake or output data in the 24 hours ending 04/22/22 1438 Filed Weights   04/21/22 1933  Weight: 78 kg    Exam:  GEN: NAD SKIN: Warm and dry EYES: No pallor or icterus ENT: MMM CV: RRR PULM: CTA B ABD: soft, ND, mild suprapubic tenderness, no rebound tenderness or guarding, +BS CNS: AAO x 3, non focal EXT: No edema or tenderness GU: Left CVA tenderness        Data Reviewed:   I have personally reviewed following labs and imaging studies:  Labs: Labs show the following:   Basic Metabolic Panel: Recent Labs  Lab 04/21/22 1924 04/21/22 2139 04/22/22 0500  NA 139  --  137  K 3.6  --  3.7  CL 100  --  101  CO2 30  --  27  GLUCOSE 133*  --  113*  BUN 12  --  14  CREATININE 1.39* 1.26* 1.29*  CALCIUM 8.7*  --  7.8*   GFR Estimated Creatinine Clearance: 38.4 mL/min (A) (by C-G formula based on SCr of 1.29 mg/dL (H)). Liver Function Tests: Recent Labs  Lab 04/21/22 1924 04/22/22 0500  AST 19 51*  ALT 13 18  ALKPHOS 50 44  BILITOT 0.5 0.6  PROT 7.2 5.9*  ALBUMIN 3.0* 2.5*   No results for input(s): "LIPASE", "AMYLASE" in the last 168 hours. No results for input(s): "AMMONIA" in the last 168 hours. Coagulation profile Recent Labs  Lab 04/21/22 1924  INR 1.2    CBC: Recent Labs  Lab 04/21/22 1924 04/21/22 2139 04/22/22 0500  WBC 17.3* 16.0* 16.3*  NEUTROABS 15.0*  --   --   HGB 10.2* 8.8* 8.7*  HCT 31.6* 27.9* 27.4*  MCV 93.2 93.3 93.2  PLT 261 208 188   Cardiac Enzymes: No results for input(s): "CKTOTAL", "CKMB", "CKMBINDEX", "TROPONINI" in the last 168 hours. BNP (last  3 results) Recent Labs    08/10/21 0835  PROBNP 157   CBG: Recent Labs  Lab 04/22/22 0736  GLUCAP 116*   D-Dimer: Recent Labs    04/21/22 2139  DDIMER 2.62*   Hgb A1c: Recent Labs    04/21/22 2220  HGBA1C 6.1*   Lipid Profile: No results for input(s): "CHOL", "  HDL", "LDLCALC", "TRIG", "CHOLHDL", "LDLDIRECT" in the last 72 hours. Thyroid function studies: Recent Labs    04/21/22 2220  TSH 0.242*   Anemia work up: No results for input(s): "VITAMINB12", "FOLATE", "FERRITIN", "TIBC", "IRON", "RETICCTPCT" in the last 72 hours. Sepsis Labs: Recent Labs  Lab 04/21/22 1924 04/21/22 1925 04/21/22 2132 04/21/22 2139 04/21/22 2220 04/22/22 0139 04/22/22 0500  PROCALCITON  --   --   --  0.25  --   --   --   WBC 17.3*  --   --  16.0*  --   --  16.3*  LATICACIDVEN  --  2.6* 1.4  --  1.0 1.3  --     Microbiology Recent Results (from the past 240 hour(s))  Blood Culture (routine x 2)     Status: None (Preliminary result)   Collection Time: 04/21/22  7:05 PM   Specimen: BLOOD  Result Value Ref Range Status   Specimen Description   Final    BLOOD BLOOD LEFT FOREARM Performed at [facility], [place] [address], [place], [place] [address]    Special Requests   Final    BOTTLES DRAWN AEROBIC AND ANAEROBIC Blood Culture adequate volume Performed at [facility] [address] [place], [place] [address]    Culture   Final    NO GROWTH < 12 HOURS Performed at [facility] [address] [place], [place] [address]    Report Status PENDING  Incomplete  Blood Culture (routine x 2)     Status: None (Preliminary result)   Collection Time: 04/21/22  7:10 PM   Specimen: BLOOD  Result Value Ref Range Status   Specimen Description   Final    BLOOD BLOOD RIGHT FOREARM Performed at [facility] [address] [place], [place] [address]    Special Requests   Final    BOTTLES DRAWN AEROBIC ONLY Blood Culture  adequate volume Performed at [facility] [address], [place], [place] [address]    Culture   Final    NO GROWTH < 12 HOURS Performed at [facility] [address] [place],  [address]    Report Status PENDING  Incomplete  Resp Panel by RT-PCR (Flu A&B, Covid) Anterior Nasal Swab     Status: None   Collection Time: 04/21/22  7:37 PM   Specimen: Anterior Nasal Swab  Result Value Ref Range Status   SARS Coronavirus 2 by RT PCR NEGATIVE NEGATIVE Final    Comment: (NOTE) SARS-CoV-2 target nucleic acids are NOT DETECTED.  The SARS-CoV-2 RNA is generally detectable in upper respiratory specimens during the acute phase of infection. The lowest concentration of SARS-CoV-2 viral copies this assay can detect is 138 copies/mL. A negative result does not preclude SARS-Cov-2 infection and should not be used as the sole basis for treatment or other patient management decisions. A negative result may occur with  improper specimen collection/handling, submission of specimen other than nasopharyngeal swab, presence of viral mutation(s) within the areas targeted by this assay, and inadequate number of viral copies(<138 copies/mL). A negative result must be combined with clinical observations, patient history, and epidemiological information. The expected result is Negative.  Fact Sheet for Patients:  EntrepreneurPulse.com.au  Fact Sheet for Healthcare Providers:  IncredibleEmployment.be  This test is no t yet approved or cleared by the [place] FDA and  has been authorized for detection and/or diagnosis of SARS-CoV-2 by FDA under an Emergency Use Authorization (  EUA). This EUA will remain  in effect (meaning this test can be used) for the duration of the COVID-19 declaration under Section 564(b)(1) of the Act, 21 U.S.C.section 360bbb-3(b)(1), unless the authorization is terminated  or revoked sooner.        Influenza A by PCR NEGATIVE NEGATIVE Final   Influenza B by PCR NEGATIVE NEGATIVE Final    Comment: (NOTE) The Xpert Xpress SARS-CoV-2/FLU/RSV plus assay is intended as an aid in the diagnosis of influenza from Nasopharyngeal swab specimens and should not be used as a sole basis for treatment. Nasal washings and aspirates are unacceptable for Xpert Xpress SARS-CoV-2/FLU/RSV testing.  Fact Sheet for Patients: EntrepreneurPulse.com.au  Fact Sheet for Healthcare Providers: IncredibleEmployment.be  This test is not yet approved or cleared by the [place] FDA and has been authorized for detection and/or diagnosis of SARS-CoV-2 by FDA under an Emergency Use Authorization (EUA). This EUA will remain in effect (meaning this test can be used) for the duration of the COVID-19 declaration under Section 564(b)(1) of the Act, 21 U.S.C. section 360bbb-3(b)(1), unless the authorization is terminated or revoked.  Performed at [facility], [place] [address] [place], [place] [address]     Procedures and diagnostic studies:  CT Angio Chest Pulmonary Embolism (PE) W or WO Contrast  Addendum Date: 04/22/2022   ADDENDUM REPORT: 04/22/2022 09:18 ADDENDUM: Study discussed by telephone with Dr. Mal Misty on 04/22/2022 at 0910 hours. Electronically Signed   By: Genevie Ann M.D.   On: 04/22/2022 09:18   Result Date: 04/22/2022 CLINICAL DATA:  76 year old female with sepsis. Left ureteral carcinoma, stent. EXAM: CT ANGIOGRAPHY CHEST WITH CONTRAST TECHNIQUE: Multidetector CT imaging of the chest was performed using the standard protocol during bolus administration of intravenous contrast. Multiplanar CT image reconstructions and MIPs were obtained to evaluate the vascular anatomy. RADIATION DOSE REDUCTION: This exam was performed according to the departmental dose-optimization program which includes automated exposure control, adjustment of the mA and/or kV  according to patient size and/or use of iterative reconstruction technique. CONTRAST:  51m OMNIPAQUE IOHEXOL 350 MG/ML SOLN COMPARISON:  CT Abdomen and Pelvis yesterday. PET-CT 04/05/2022. FINDINGS: Cardiovascular: Good contrast bolus timing in the pulmonary arterial tree. Mild respiratory motion. Questionable small nonocclusive thrombus right lower lobe at the segmental branching level series 5, image 151. No other pulmonary artery filling defect. No central or saddle embolus. Calcified coronary artery atherosclerosis. No cardiomegaly or pericardial effusion. Calcified aortic atherosclerosis. Mediastinum/Nodes: Negative. No mediastinal mass or lymphadenopathy. Lungs/Pleura: Layering and enhancing dependent atelectasis in both lungs. Major airways are patent. There is mild bilateral bronchial wall thickening. Mild biapical pulmonary septal thickening. No consolidation. No pleural effusion. Upper Abdomen: Stable visible upper abdominal viscera. Musculoskeletal: Thoracic spine degeneration. No acute or suspicious osseous lesion. Review of the MIP images confirms the above findings. IMPRESSION: 1. Evidence of only a solitary, small nonocclusive thrombus in the right lower lobe at the segmental branching level. Significance is doubtful, with no other pulmonary embolus identified. 2. Bilateral pulmonary atelectasis. No pleural effusion or evidence of pulmonary infarct. 3. Calcified coronary artery atherosclerosis. Aortic Atherosclerosis (ICD10-I70.0). Electronically Signed: By: HGenevie AnnM.D. On: 04/22/2022 09:03   CT ABDOMEN PELVIS W CONTRAST  Result Date: 04/21/2022 CLINICAL DATA:  Sepsis Abdominal/flank pain, hematuria (Ped 0-17y). Prior right nephrectomy for urothelial carcinoma. EXAM: CT ABDOMEN AND PELVIS WITH CONTRAST TECHNIQUE: Multidetector CT imaging of the abdomen and pelvis was performed using the standard protocol following bolus administration of intravenous contrast. RADIATION DOSE REDUCTION: This exam  was performed according to the departmental dose-optimization program which includes automated exposure control, adjustment of the mA and/or kV according to patient size and/or use of iterative reconstruction technique. CONTRAST:  60m OMNIPAQUE IOHEXOL 300 MG/ML  SOLN COMPARISON:  03/11/2022.  PET CT 04/05/2022 FINDINGS: Lower chest: No acute abnormality Hepatobiliary: No focal hepatic abnormality. Gallbladder unremarkable. Pancreas: No focal abnormality or ductal dilatation. Spleen: No focal abnormality.  Normal size. Adrenals/Urinary Tract: Prior right nephrectomy. Left ureteral stent remains in place. No hydronephrosis. There is mild left perinephric stranding which is increased since prior study. Adrenal glands and urinary bladder unremarkable. Stomach/Bowel: Stomach, large and small bowel grossly unremarkable. Normal appendix. Vascular/Lymphatic: Heavily calcified aorta. No aneurysm. Mildly prominent left periaortic lymph nodes. Index node has a short axis diameter of 7 mm. None pathologically enlarged or changed since prior study. Reproductive: Prior hysterectomy.  No adnexal masses. Other: No free fluid or free air. Musculoskeletal: No acute bony abnormality. IMPRESSION: Left ureteral stent remains in place. There is mild left perinephric stranding which is increased since prior study. No hydronephrosis. Aortic atherosclerosis. Sigmoid diverticulosis. Electronically Signed   By: KRolm BaptiseM.D.   On: 04/21/2022 20:25   DG Chest Port 1 View  Result Date: 04/21/2022 CLINICAL DATA:  Questionable sepsis - evaluate for abnormality EXAM: PORTABLE CHEST 1 VIEW COMPARISON:  11/27/2018 FINDINGS: Heart and mediastinal contours are within normal limits. No focal opacities or effusions. No acute bony abnormality. IMPRESSION: No active disease. Electronically Signed   By: KRolm BaptiseM.D.   On: 04/21/2022 20:03               LOS: 1 day   Corbett Moulder  [facility]   Pager on [email or web address]  If 7PM-7AM, please contact night-coverage at [email or web address]     04/22/2022, 2:38 PM

## 2022-04-22 NOTE — Progress Notes (Signed)
PHARMACY - PHYSICIAN COMMUNICATION CRITICAL VALUE ALERT - BLOOD CULTURE IDENTIFICATION (BCID)  [name] is an 76 y.o. female who presented to [facility] on 04/21/2022 with a chief complaint of fever, N/V, weakness.  Assessment:  Patient underwent left ureteral stent placement on 03/23/22 and has subsequently been prescribed cephalexin and cipro for UTI.   BCID + 2/3 MRSE. One bottle positive in each set.  Name of physician (or Provider) Contacted: Dr. Mal Misty  Current antibiotics: Ceftriaxone 2 g IV daily for UTI  Changes to prescribed antibiotics recommended: Adding vancomycin   Results for orders placed or performed during the hospital encounter of 04/21/22  Blood Culture ID Panel (Reflexed) (Collected: 04/21/2022  7:05 PM)  Result Value Ref Range   Enterococcus faecalis NOT DETECTED NOT DETECTED   Enterococcus Faecium NOT DETECTED NOT DETECTED   Listeria monocytogenes NOT DETECTED NOT DETECTED   Staphylococcus species DETECTED (A) NOT DETECTED   Staphylococcus aureus (BCID) NOT DETECTED NOT DETECTED   Staphylococcus epidermidis DETECTED (A) NOT DETECTED   Staphylococcus lugdunensis NOT DETECTED NOT DETECTED   Streptococcus species NOT DETECTED NOT DETECTED   Streptococcus agalactiae NOT DETECTED NOT DETECTED   Streptococcus pneumoniae NOT DETECTED NOT DETECTED   Streptococcus pyogenes NOT DETECTED NOT DETECTED   A.calcoaceticus-baumannii NOT DETECTED NOT DETECTED   Bacteroides fragilis NOT DETECTED NOT DETECTED   Enterobacterales NOT DETECTED NOT DETECTED   Enterobacter cloacae complex NOT DETECTED NOT DETECTED   Escherichia coli NOT DETECTED NOT DETECTED   Klebsiella aerogenes NOT DETECTED NOT DETECTED   Klebsiella oxytoca NOT DETECTED NOT DETECTED   Klebsiella pneumoniae NOT DETECTED NOT DETECTED   Proteus species NOT DETECTED NOT DETECTED   Salmonella species NOT DETECTED NOT DETECTED   Serratia marcescens NOT DETECTED NOT DETECTED   Haemophilus influenzae NOT  DETECTED NOT DETECTED   Neisseria meningitidis NOT DETECTED NOT DETECTED   Pseudomonas aeruginosa NOT DETECTED NOT DETECTED   Stenotrophomonas maltophilia NOT DETECTED NOT DETECTED   Candida albicans NOT DETECTED NOT DETECTED   Candida auris NOT DETECTED NOT DETECTED   Candida glabrata NOT DETECTED NOT DETECTED   Candida krusei NOT DETECTED NOT DETECTED   Candida parapsilosis NOT DETECTED NOT DETECTED   Candida tropicalis NOT DETECTED NOT DETECTED   Cryptococcus neoformans/gattii NOT DETECTED NOT DETECTED   Methicillin resistance mecA/C DETECTED (A) NOT [place], PharmD 04/22/2022  7:13 PM

## 2022-04-23 DIAGNOSIS — I2699 Other pulmonary embolism without acute cor pulmonale: Secondary | ICD-10-CM | POA: Diagnosis not present

## 2022-04-23 DIAGNOSIS — J9601 Acute respiratory failure with hypoxia: Secondary | ICD-10-CM | POA: Diagnosis not present

## 2022-04-23 DIAGNOSIS — A419 Sepsis, unspecified organism: Secondary | ICD-10-CM | POA: Diagnosis not present

## 2022-04-23 DIAGNOSIS — N12 Tubulo-interstitial nephritis, not specified as acute or chronic: Secondary | ICD-10-CM | POA: Diagnosis not present

## 2022-04-23 LAB — URINE CULTURE

## 2022-04-23 LAB — CREATININE, SERUM
Creatinine, Ser: 1.67 mg/dL — ABNORMAL HIGH (ref 0.44–1.00)
GFR, Estimated: 32 mL/min — ABNORMAL LOW (ref >60)

## 2022-04-23 LAB — GLUCOSE, CAPILLARY: Glucose-Capillary: 100 mg/dL — ABNORMAL HIGH (ref 70–99)

## 2022-04-23 MED ORDER — VANCOMYCIN HCL IN DEXTROSE 1-5 GM/200ML-% IV SOLN: 1000.0000 mg | INTRAVENOUS | Status: DC

## 2022-04-23 MED ORDER — ENSURE ENLIVE PO LIQD
237.0000 mL | Freq: Three times a day (TID) | ORAL | Status: DC
  Administered 2022-04-25 – 2022-04-26 (×3): 237 mL via ORAL

## 2022-04-23 MED ORDER — ADULT MULTIVITAMIN W/MINERALS CH
1.0000 | ORAL_TABLET | Freq: Every day | ORAL | Status: DC
  Administered 2022-04-24 – 2022-04-26 (×3): 1 via ORAL
  Filled 2022-04-23 (×3): qty 1

## 2022-04-23 NOTE — Progress Notes (Signed)
Initial Nutrition Assessment  DOCUMENTATION CODES:   Not applicable  INTERVENTION:   Ensure Enlive po TID, each supplement provides 350 kcal and 20 grams of protein.  MVI po daily   Liberalize diet   Pt at high refeed risk; recommend monitor potassium, magnesium and phosphorus labs daily until stable  NUTRITION DIAGNOSIS:   Inadequate oral intake related to acute illness as evidenced by per patient/family report.  GOAL:   Patient will meet greater than or equal to 90% of their needs  MONITOR:   PO intake, Supplement acceptance, Labs, Weight trends, Skin, I & O's  REASON FOR ASSESSMENT:   Malnutrition Screening Tool    ASSESSMENT:   76 y.o. female with medical history significant of moderate nonobstructive CAD (70% Diag disease 11/05/20), HFmrEF, HTN ,CKDIII, anxiety, depression, GERD, CKD, GERD and urothelial carcinoma s/p ureterectomy complicated by recurrence on left s/p left double J stent replacement on 03/09/2022 and currently on chemotherapy (last dose ~2 weeks ago) and who is now admitted with acute pyelonephritis.  RD working remotely.  Unable to reach pt by phone. Per chart review, pt reports poor appetite and oral intake pta along with a 15lb weight loss over the past 2 months. There are no documented meal intakes from this admission. RD will add supplements and MVI to help pt meet her estimated needs. RD will also liberalize pt's diet. Pt is at refeed risk. Per chart, pt appears fairly weight stable over the past year. RD will  obtain nutrition related history and exam at follow-up.   Medications reviewed and include: aspirin, tums, vitamin D, colace, ceftriaxone, vancomycin   Labs reviewed: K 3.7 wnl, creat 1.67(H) Wbc- 16.3(H), Hgb 8.7(L), Hct 27.4(L)  NUTRITION - FOCUSED PHYSICAL EXAM: Unable to perform at this time   Diet Order:   Diet Order             Diet Heart Room service appropriate? Yes; Fluid consistency: Thin  Diet effective now                   EDUCATION NEEDS:   Not appropriate for education at this time  Skin:  Skin Assessment: Reviewed RN Assessment  Last BM:  pta  Height:   Ht Readings from Last 1 Encounters:  04/21/22 '5\' 3"'$  (1.6 m)    Weight:   Wt Readings from Last 1 Encounters:  04/21/22 78 kg    Ideal Body Weight:  52.3 kg  BMI:  Body mass index is 30.46 kg/m.  Estimated Nutritional Needs:   Kcal:  1700-2000kcal/day  Protein:  85-100g/day  Fluid:  1.4-1.6L/day  Koleen Distance MS, RD, LDN Please refer to [facility] for RD and/or RD on-call/weekend/after hours pager

## 2022-04-23 NOTE — Progress Notes (Addendum)
Progress Note    [name]  PJA:[id number] DOB: 27-Jun-1946  DOA: 04/21/2022 PCP: [name], MD      Brief Narrative:    Medical records reviewed and are as summarized below:  MARLISA [name] is a 76 y.o. female with medical history significant of moderate nonobstructive CAD (70% Diag disease 11/05/20), HFmrEF, HTN ,CKD IV, depression , GERD, urothelial carcinoma s/p ureterectomy further complicated by recurrence on left for which she is followed by urology and oncology.  Left ureteral stent was removed on 03/09/2022 and left ureteral stent was placed on 03/23/2022.  She said she has had 2 rounds of antibiotics for UTIs since that time. Further interim history notes last chemo therapy 2 weeks ago.  She said chemotherapy has been discontinued because of intolerance to treatment.  She presented to the hospital because of fever, nausea, vomiting, cough, left flank pain, generalized weakness and a fall at home..      Assessment/Plan:   Principal Problem:   Severe sepsis (HCC) Active Problems:   Acute respiratory failure with hypoxia (HCC)   HFrEF (heart failure with reduced ejection fraction) (HCC)   Pyelonephritis   Acute pulmonary embolism (HCC)   Urothelial carcinoma (HCC)   Body mass index is 30.46 kg/m.  (Obesity)   Fever, severe sepsis secondary to acute left pyelonephritis, recent left ureteral stent placement on 03/23/2022, recent use of antibiotics for complicated UTI:  Continue IV ceftriaxone.  Discontinue IV vancomycin.  Consulted Dr. Tresa Moore, urologist. Of note, BCID was initially reported as Staph epidermidis.  Patient was empirically started on IV vancomycin.  ID was consulted.  However, blood culture eventually showed Staph hominis and Staph capitis and this was consistent with bacteremia.  IV vancomycin has been discontinued.  2D echo has been canceled.  Hypotension: No additional IV fluids to avoid fluid overload.  Monitor BP closely.   Acute  right-sided pulmonary embolism: Continue Eliquis  Acute hypoxic respiratory failure: Improved  Urothelial carcinoma: Most recent chemotherapy was about 2 weeks prior to admission.  Patient said treatment has been discontinued because of intolerance to treatment.  Outpatient follow-up with urologist and oncologist.  CKD stage IV: Creatinine is stable  Chronic diastolic CHF: Compensated.  2D echo in May 2022 showed EF estimated at 70 to 34%, grade 1 diastolic dysfunction.  Other comorbidities include hypertension, hyperlipidemia, GERD, depression, anxiety   Diet Order             Diet regular Room service appropriate? Yes; Fluid consistency: Thin  Diet effective now                            Consultants: Urologist, ID  Procedures: None    Medications:    apixaban  10 mg Oral BID   Followed by   Derrill Memo ON 04/29/2022] apixaban  5 mg Oral BID   aspirin EC  81 mg Oral Daily   calcium carbonate  1 tablet Oral q morning   carvedilol  6.25 mg Oral BID WC   cholecalciferol  1,000 Units Oral Daily   docusate sodium  100 mg Oral BID   feeding supplement  237 mL Oral TID BM   hydrALAZINE  50 mg Oral TID   isosorbide mononitrate  15 mg Oral Daily   [START ON 04/24/2022] multivitamin with minerals  1 tablet Oral Daily   rosuvastatin  40 mg Oral Daily   sertraline  50 mg Oral Daily  Continuous Infusions:  cefTRIAXone (ROCEPHIN)  IV 2 g (04/22/22 1733)     Anti-infectives (From admission, onward)    Start     Dose/Rate Route Frequency Ordered Stop   04/24/22 0800  vancomycin (VANCOCIN) IVPB 1000 mg/200 mL premix  Status:  Discontinued        1,000 mg 200 mL/hr over 60 Minutes Intravenous Every 36 hours 04/23/22 1015 04/23/22 1300   04/23/22 2000  vancomycin (VANCOREADY) IVPB 750 mg/150 mL  Status:  Discontinued        750 mg 150 mL/hr over 60 Minutes Intravenous Every 24 hours 04/22/22 1932 04/23/22 1003   04/22/22 1945  vancomycin (VANCOREADY) IVPB 1500 mg/300  mL        1,500 mg 150 mL/hr over 120 Minutes Intravenous NOW 04/22/22 1859 04/22/22 2200   04/21/22 1900  cefTRIAXone (ROCEPHIN) 2 g in sodium chloride 0.9 % 100 mL IVPB        2 g 200 mL/hr over 30 Minutes Intravenous Every 24 hours 04/21/22 1848 04/28/22 1759              Family Communication/Anticipated D/C date and plan/Code Status   DVT prophylaxis:  apixaban (ELIQUIS) tablet 10 mg  apixaban (ELIQUIS) tablet 5 mg     Code Status: Full Code  Family Communication: None Disposition Plan: Plan to discharge home in 2 to 3 days   Status is: Inpatient Remains inpatient appropriate because: On IV antibiotics       Subjective:   Interval events noted.  She still has pain on the left flank.  Objective:    Vitals:   04/22/22 2344 04/23/22 0427 04/23/22 0938 04/23/22 1242  BP: (!) 109/45 (!) 118/54 114/64 (!) 93/59  Pulse: 81 81  83  Resp: '18 18  18  '$ Temp: 98.8 F (37.1 C) 98.2 F (36.8 C)  98.6 F (37 C)  TempSrc:    Oral  SpO2: 93% 99%  99%  Weight:      Height:       No data found.   Intake/Output Summary (Last 24 hours) at 04/23/2022 1301 Last data filed at 04/23/2022 6283 Gross per 24 hour  Intake 440 ml  Output 750 ml  Net -310 ml   Filed Weights   04/21/22 1933  Weight: 78 kg    Exam:  GEN: NAD SKIN: Warm and dry EYES: No pallor or icterus ENT: MMM CV: RRR PULM: CTA B ABD: soft, ND, mild suprapubic tenderness, +BS CNS: AAO x 3, non focal EXT: No edema or tenderness GU: Left CVA tenderness   Data Reviewed:   I have personally reviewed following labs and imaging studies:  Labs: Labs show the following:   Basic Metabolic Panel: Recent Labs  Lab 04/21/22 1924 04/21/22 2139 04/22/22 0500 04/23/22 0346  NA 139  --  137  --   K 3.6  --  3.7  --   CL 100  --  101  --   CO2 30  --  27  --   GLUCOSE 133*  --  113*  --   BUN 12  --  14  --   CREATININE 1.39* 1.26* 1.29* 1.67*  CALCIUM 8.7*  --  7.8*  --     GFR Estimated Creatinine Clearance: 29.6 mL/min (A) (by C-G formula based on SCr of 1.67 mg/dL (H)). Liver Function Tests: Recent Labs  Lab 04/21/22 1924 04/22/22 0500  AST 19 51*  ALT 13 18  ALKPHOS 50 44  BILITOT 0.5  0.6  PROT 7.2 5.9*  ALBUMIN 3.0* 2.5*   No results for input(s): "LIPASE", "AMYLASE" in the last 168 hours. No results for input(s): "AMMONIA" in the last 168 hours. Coagulation profile Recent Labs  Lab 04/21/22 1924  INR 1.2    CBC: Recent Labs  Lab 04/21/22 1924 04/21/22 2139 04/22/22 0500  WBC 17.3* 16.0* 16.3*  NEUTROABS 15.0*  --   --   HGB 10.2* 8.8* 8.7*  HCT 31.6* 27.9* 27.4*  MCV 93.2 93.3 93.2  PLT 261 208 188   Cardiac Enzymes: No results for input(s): "CKTOTAL", "CKMB", "CKMBINDEX", "TROPONINI" in the last 168 hours. BNP (last 3 results) Recent Labs    08/10/21 0835  PROBNP 157   CBG: Recent Labs  Lab 04/22/22 0736 04/23/22 0832  GLUCAP 116* 100*   D-Dimer: Recent Labs    04/21/22 2139  DDIMER 2.62*   Hgb A1c: Recent Labs    04/21/22 2220  HGBA1C 6.1*   Lipid Profile: No results for input(s): "CHOL", "HDL", "LDLCALC", "TRIG", "CHOLHDL", "LDLDIRECT" in the last 72 hours. Thyroid function studies: Recent Labs    04/21/22 2220  TSH 0.242*   Anemia work up: No results for input(s): "VITAMINB12", "FOLATE", "FERRITIN", "TIBC", "IRON", "RETICCTPCT" in the last 72 hours. Sepsis Labs: Recent Labs  Lab 04/21/22 1924 04/21/22 1925 04/21/22 2132 04/21/22 2139 04/21/22 2220 04/22/22 0139 04/22/22 0500  PROCALCITON  --   --   --  0.25  --   --   --   WBC 17.3*  --   --  16.0*  --   --  16.3*  LATICACIDVEN  --  2.6* 1.4  --  1.0 1.3  --     Microbiology Recent Results (from the past 240 hour(s))  Blood Culture (routine x 2)     Status: Abnormal (Preliminary result)   Collection Time: 04/21/22  7:05 PM   Specimen: BLOOD  Result Value Ref Range Status   Specimen Description   Final    BLOOD BLOOD LEFT  FOREARM Performed at [facility], [place] [address], [place], [place] [address]    Special Requests   Final    BOTTLES DRAWN AEROBIC AND ANAEROBIC Blood Culture adequate volume Performed at [facility] [address], [place], [place] [address]    Culture  Setup Time   Final    GRAM POSITIVE COCCI IN CLUSTERS IN BOTH AEROBIC AND ANAEROBIC BOTTLES CRITICAL RESULT CALLED TO, READ BACK BY AND VERIFIED WITH: PHARMD M.STEWYNE AT 1850 ON 04/22/2022 BY T.SAAD.    Culture (A)  Final    STAPHYLOCOCCUS CAPITIS CULTURE REINCUBATED FOR BETTER GROWTH Performed at [facility], [place] [address] [place] [address] [place], [place] [address]    Report Status PENDING  Incomplete  Blood Culture ID Panel (Reflexed)     Status: Abnormal   Collection Time: 04/21/22  7:05 PM  Result Value Ref Range Status   Enterococcus faecalis NOT DETECTED NOT DETECTED Final   Enterococcus Faecium NOT DETECTED NOT DETECTED Final   Listeria monocytogenes NOT DETECTED NOT DETECTED Final   Staphylococcus species DETECTED (A) NOT DETECTED Final    Comment: CRITICAL RESULT CALLED TO, READ BACK BY AND VERIFIED WITH: PHARMD M.STEWYNE AT 1850 ON 04/22/2022 BY T.SAAD.    Staphylococcus aureus (BCID) NOT DETECTED NOT DETECTED Final   Staphylococcus epidermidis DETECTED (A) NOT DETECTED Final    Comment: Methicillin (oxacillin) resistant coagulase negative staphylococcus. Possible blood culture contaminant (unless isolated from more than one blood culture draw  or clinical case suggests pathogenicity). No antibiotic treatment is indicated for blood  culture contaminants. CRITICAL RESULT CALLED TO, READ BACK BY AND VERIFIED WITH: PHARMD M.STEWYNE AT 1850 ON 04/22/2022 BY T.SAAD.    Staphylococcus lugdunensis NOT DETECTED NOT DETECTED Final   Streptococcus species NOT DETECTED NOT DETECTED Final   Streptococcus agalactiae NOT DETECTED NOT DETECTED Final   Streptococcus pneumoniae NOT DETECTED NOT DETECTED  Final   Streptococcus pyogenes NOT DETECTED NOT DETECTED Final   A.calcoaceticus-baumannii NOT DETECTED NOT DETECTED Final   Bacteroides fragilis NOT DETECTED NOT DETECTED Final   Enterobacterales NOT DETECTED NOT DETECTED Final   Enterobacter cloacae complex NOT DETECTED NOT DETECTED Final   Escherichia coli NOT DETECTED NOT DETECTED Final   Klebsiella aerogenes NOT DETECTED NOT DETECTED Final   Klebsiella oxytoca NOT DETECTED NOT DETECTED Final   Klebsiella pneumoniae NOT DETECTED NOT DETECTED Final   Proteus species NOT DETECTED NOT DETECTED Final   Salmonella species NOT DETECTED NOT DETECTED Final   Serratia marcescens NOT DETECTED NOT DETECTED Final   Haemophilus influenzae NOT DETECTED NOT DETECTED Final   Neisseria meningitidis NOT DETECTED NOT DETECTED Final   Pseudomonas aeruginosa NOT DETECTED NOT DETECTED Final   Stenotrophomonas maltophilia NOT DETECTED NOT DETECTED Final   Candida albicans NOT DETECTED NOT DETECTED Final   Candida auris NOT DETECTED NOT DETECTED Final   Candida glabrata NOT DETECTED NOT DETECTED Final   Candida krusei NOT DETECTED NOT DETECTED Final   Candida parapsilosis NOT DETECTED NOT DETECTED Final   Candida tropicalis NOT DETECTED NOT DETECTED Final   Cryptococcus neoformans/gattii NOT DETECTED NOT DETECTED Final   Methicillin resistance mecA/C DETECTED (A) NOT DETECTED Final    Comment: CRITICAL RESULT CALLED TO, READ BACK BY AND VERIFIED WITH: PHARMD M.STEWYNE AT 1850 ON 04/22/2022 BY T.SAAD. Performed at [facility], [place] [address], [place], [place] [address]   Blood Culture (routine x 2)     Status: Abnormal (Preliminary result)   Collection Time: 04/21/22  7:10 PM   Specimen: BLOOD  Result Value Ref Range Status   Specimen Description   Final    BLOOD BLOOD RIGHT FOREARM Performed at [facility] [address] [place], [place] [address]    Special Requests   Final    BOTTLES DRAWN AEROBIC ONLY Blood Culture  adequate volume Performed at [facility] [address], [place], [place] [address]    Culture  Setup Time   Final    GRAM POSITIVE COCCI IN CLUSTERS AEROBIC BOTTLE ONLY    Culture (A)  Final    STAPHYLOCOCCUS HOMINIS CULTURE REINCUBATED FOR BETTER GROWTH Performed at [facility], [place] [address], [place], [place] [address]    Report Status PENDING  Incomplete  Resp Panel by RT-PCR (Flu A&B, Covid) Anterior Nasal Swab     Status: None   Collection Time: 04/21/22  7:37 PM   Specimen: Anterior Nasal Swab  Result Value Ref Range Status   SARS Coronavirus 2 by RT PCR NEGATIVE NEGATIVE Final    Comment: (NOTE) SARS-CoV-2 target nucleic acids are NOT DETECTED.  The SARS-CoV-2 RNA is generally detectable in upper respiratory specimens during the acute phase of infection. The lowest concentration of SARS-CoV-2 viral copies this assay can detect is 138 copies/mL. A negative result does not preclude SARS-Cov-2 infection and should not be used as the sole basis for treatment or other patient management decisions. A negative result may occur with  improper specimen collection/handling, submission  of specimen other than nasopharyngeal swab, presence of viral mutation(s) within the areas targeted by this assay, and inadequate number of viral copies(<138 copies/mL). A negative result must be combined with clinical observations, patient history, and epidemiological information. The expected result is Negative.  Fact Sheet for Patients:  EntrepreneurPulse.com.au  Fact Sheet for Healthcare Providers:  IncredibleEmployment.be  This test is no t yet approved or cleared by the [place] FDA and  has been authorized for detection and/or diagnosis of SARS-CoV-2 by FDA under an Emergency Use Authorization (EUA). This EUA will remain  in effect (meaning this test can be used) for the duration of the COVID-19 declaration under  Section 564(b)(1) of the Act, 21 U.S.C.section 360bbb-3(b)(1), unless the authorization is terminated  or revoked sooner.       Influenza A by PCR NEGATIVE NEGATIVE Final   Influenza B by PCR NEGATIVE NEGATIVE Final    Comment: (NOTE) The Xpert Xpress SARS-CoV-2/FLU/RSV plus assay is intended as an aid in the diagnosis of influenza from Nasopharyngeal swab specimens and should not be used as a sole basis for treatment. Nasal washings and aspirates are unacceptable for Xpert Xpress SARS-CoV-2/FLU/RSV testing.  Fact Sheet for Patients: EntrepreneurPulse.com.au  Fact Sheet for Healthcare Providers: IncredibleEmployment.be  This test is not yet approved or cleared by the [place] FDA and has been authorized for detection and/or diagnosis of SARS-CoV-2 by FDA under an Emergency Use Authorization (EUA). This EUA will remain in effect (meaning this test can be used) for the duration of the COVID-19 declaration under Section 564(b)(1) of the Act, 21 U.S.C. section 360bbb-3(b)(1), unless the authorization is terminated or revoked.  Performed at [facility], [place] [address], [place], [place] [address]   Urine Culture     Status: Abnormal   Collection Time: 04/22/22 12:31 AM   Specimen: In/Out Cath Urine  Result Value Ref Range Status   Specimen Description   Final    IN/OUT CATH URINE Performed at [facility] [address] [place], [place] [address]    Special Requests   Final    NONE Performed at [facility], [place] [address] [place], [place] [address]    Culture MULTIPLE SPECIES PRESENT, SUGGEST RECOLLECTION (A)  Final   Report Status 04/23/2022 FINAL  Final    Procedures and diagnostic studies:  CT Angio Chest Pulmonary Embolism (PE) W or WO Contrast  Addendum Date: 04/22/2022   ADDENDUM REPORT: 04/22/2022 09:18 ADDENDUM: Study discussed by telephone with Dr. Mal Misty on  04/22/2022 at 0910 hours. Electronically Signed   By: Genevie Ann M.D.   On: 04/22/2022 09:18   Result Date: 04/22/2022 CLINICAL DATA:  76 year old female with sepsis. Left ureteral carcinoma, stent. EXAM: CT ANGIOGRAPHY CHEST WITH CONTRAST TECHNIQUE: Multidetector CT imaging of the chest was performed using the standard protocol during bolus administration of intravenous contrast. Multiplanar CT image reconstructions and MIPs were obtained to evaluate the vascular anatomy. RADIATION DOSE REDUCTION: This exam was performed according to the departmental dose-optimization program which includes automated exposure control, adjustment of the mA and/or kV according to patient size and/or use of iterative reconstruction technique. CONTRAST:  64m OMNIPAQUE IOHEXOL 350 MG/ML SOLN COMPARISON:  CT Abdomen and Pelvis yesterday. PET-CT 04/05/2022. FINDINGS: Cardiovascular: Good contrast bolus timing in the pulmonary arterial tree. Mild respiratory motion. Questionable small nonocclusive thrombus right lower lobe at the segmental branching level series 5, image 151. No other pulmonary artery filling defect. No central or saddle embolus. Calcified coronary artery  atherosclerosis. No cardiomegaly or pericardial effusion. Calcified aortic atherosclerosis. Mediastinum/Nodes: Negative. No mediastinal mass or lymphadenopathy. Lungs/Pleura: Layering and enhancing dependent atelectasis in both lungs. Major airways are patent. There is mild bilateral bronchial wall thickening. Mild biapical pulmonary septal thickening. No consolidation. No pleural effusion. Upper Abdomen: Stable visible upper abdominal viscera. Musculoskeletal: Thoracic spine degeneration. No acute or suspicious osseous lesion. Review of the MIP images confirms the above findings. IMPRESSION: 1. Evidence of only a solitary, small nonocclusive thrombus in the right lower lobe at the segmental branching level. Significance is doubtful, with no other pulmonary embolus  identified. 2. Bilateral pulmonary atelectasis. No pleural effusion or evidence of pulmonary infarct. 3. Calcified coronary artery atherosclerosis. Aortic Atherosclerosis (ICD10-I70.0). Electronically Signed: By: Genevie Ann M.D. On: 04/22/2022 09:03   CT ABDOMEN PELVIS W CONTRAST  Result Date: 04/21/2022 CLINICAL DATA:  Sepsis Abdominal/flank pain, hematuria (Ped 0-17y). Prior right nephrectomy for urothelial carcinoma. EXAM: CT ABDOMEN AND PELVIS WITH CONTRAST TECHNIQUE: Multidetector CT imaging of the abdomen and pelvis was performed using the standard protocol following bolus administration of intravenous contrast. RADIATION DOSE REDUCTION: This exam was performed according to the departmental dose-optimization program which includes automated exposure control, adjustment of the mA and/or kV according to patient size and/or use of iterative reconstruction technique. CONTRAST:  52m OMNIPAQUE IOHEXOL 300 MG/ML  SOLN COMPARISON:  03/11/2022.  PET CT 04/05/2022 FINDINGS: Lower chest: No acute abnormality Hepatobiliary: No focal hepatic abnormality. Gallbladder unremarkable. Pancreas: No focal abnormality or ductal dilatation. Spleen: No focal abnormality.  Normal size. Adrenals/Urinary Tract: Prior right nephrectomy. Left ureteral stent remains in place. No hydronephrosis. There is mild left perinephric stranding which is increased since prior study. Adrenal glands and urinary bladder unremarkable. Stomach/Bowel: Stomach, large and small bowel grossly unremarkable. Normal appendix. Vascular/Lymphatic: Heavily calcified aorta. No aneurysm. Mildly prominent left periaortic lymph nodes. Index node has a short axis diameter of 7 mm. None pathologically enlarged or changed since prior study. Reproductive: Prior hysterectomy.  No adnexal masses. Other: No free fluid or free air. Musculoskeletal: No acute bony abnormality. IMPRESSION: Left ureteral stent remains in place. There is mild left perinephric stranding which is  increased since prior study. No hydronephrosis. Aortic atherosclerosis. Sigmoid diverticulosis. Electronically Signed   By: KRolm BaptiseM.D.   On: 04/21/2022 20:25   DG Chest Port 1 View  Result Date: 04/21/2022 CLINICAL DATA:  Questionable sepsis - evaluate for abnormality EXAM: PORTABLE CHEST 1 VIEW COMPARISON:  11/27/2018 FINDINGS: Heart and mediastinal contours are within normal limits. No focal opacities or effusions. No acute bony abnormality. IMPRESSION: No active disease. Electronically Signed   By: KRolm BaptiseM.D.   On: 04/21/2022 20:03               LOS: 2 days   Jylian Pappalardo  [facility]   Pager on [email or web address] If 7PM-7AM, please contact night-coverage at [email or web address]     04/23/2022, 1:01 PM

## 2022-04-23 NOTE — Progress Notes (Signed)
Pharmacy Antibiotic Note  [name] is a 76 y.o. female admitted on 04/21/2022 with UTI.   Blood cultures obtained on admission, initially +2/3 bottles for MRSE - now is +3/3 bottles. Pharmacy consulted for vancomycin dosing.   Today, 04/23/22 WBC remains elevated (last labs 7/15) SCr increased to 1.67, CrCl ~29 mL/min PCT 0.25, CRP elevated Tmax 103.1 F on 7/15, but afebrile thus far today  Plan: Ceftriaxone for UTI Given increase in SCr, will dose reduce vancomycin to 1000 mg IV q36h for estimated AUC of 475 Goal vancomycin AUC 400-550. Check levels at steady state as needed Monitor renal function, culture data  Height: '5\' 3"'$  (160 cm) Weight: 78 kg (171 lb 15.3 oz) IBW/kg (Calculated) : 52.4  Temp (24hrs), Avg:99 F (37.2 C), Min:98.2 F (36.8 C), Max:100.7 F (38.2 C)  Recent Labs  Lab 04/21/22 1924 04/21/22 1925 04/21/22 2132 04/21/22 2139 04/21/22 2220 04/22/22 0139 04/22/22 0500 04/23/22 0346  WBC 17.3*  --   --  16.0*  --   --  16.3*  --   CREATININE 1.39*  --   --  1.26*  --   --  1.29* 1.67*  LATICACIDVEN  --  2.6* 1.4  --  1.0 1.3  --   --      Estimated Creatinine Clearance: 29.6 mL/min (A) (by C-G formula based on SCr of 1.67 mg/dL (H)).    Allergies  Allergen Reactions   Erythromycin Diarrhea and Nausea And Vomiting   Lotensin [Benazepril Hcl] Cough    Antimicrobials this admission: ceftriaxone 7/14 >>  vancomycin 7/15 >>   Dose adjustments this admission: 7/16: Vanc 750 q24h (no doses given) >> 1 g q36h  Microbiology results: 7/14 BCx: 3/3 MRSE 7/14 UCx: multiple species, suggest recollection  Lenis Noon, PharmD, BCPS 04/23/2022 10:04 AM

## 2022-04-23 NOTE — Plan of Care (Signed)

## 2022-04-24 ENCOUNTER — Inpatient Hospital Stay (HOSPITAL_COMMUNITY): Payer: Medicare PPO

## 2022-04-24 DIAGNOSIS — C689 Malignant neoplasm of urinary organ, unspecified: Secondary | ICD-10-CM

## 2022-04-24 DIAGNOSIS — N12 Tubulo-interstitial nephritis, not specified as acute or chronic: Secondary | ICD-10-CM | POA: Diagnosis not present

## 2022-04-24 DIAGNOSIS — R6520 Severe sepsis without septic shock: Secondary | ICD-10-CM | POA: Diagnosis not present

## 2022-04-24 DIAGNOSIS — A419 Sepsis, unspecified organism: Secondary | ICD-10-CM | POA: Diagnosis not present

## 2022-04-24 DIAGNOSIS — I2699 Other pulmonary embolism without acute cor pulmonale: Secondary | ICD-10-CM

## 2022-04-24 LAB — CBC WITH DIFFERENTIAL/PLATELET
Abs Immature Granulocytes: 0.05 10*3/uL (ref 0.00–0.07)
Basophils Absolute: 0 10*3/uL (ref 0.0–0.1)
Basophils Relative: 0 %
Eosinophils Absolute: 0.1 10*3/uL (ref 0.0–0.5)
Eosinophils Relative: 1 %
HCT: 25.5 % — ABNORMAL LOW (ref 36.0–46.0)
Hemoglobin: 8.1 g/dL — ABNORMAL LOW (ref 12.0–15.0)
Immature Granulocytes: 1 %
Lymphocytes Relative: 12 %
Lymphs Abs: 1.2 10*3/uL (ref 0.7–4.0)
MCH: 29.7 pg (ref 26.0–34.0)
MCHC: 31.8 g/dL (ref 30.0–36.0)
MCV: 93.4 fL (ref 80.0–100.0)
Monocytes Absolute: 1.1 10*3/uL — ABNORMAL HIGH (ref 0.1–1.0)
Monocytes Relative: 11 %
Neutro Abs: 7.5 10*3/uL (ref 1.7–7.7)
Neutrophils Relative %: 75 %
Platelets: 195 10*3/uL (ref 150–400)
RBC: 2.73 MIL/uL — ABNORMAL LOW (ref 3.87–5.11)
RDW: 17.5 % — ABNORMAL HIGH (ref 11.5–15.5)
WBC: 10 10*3/uL (ref 4.0–10.5)
nRBC: 0 % (ref 0.0–0.2)

## 2022-04-24 LAB — BASIC METABOLIC PANEL
Anion gap: 8 (ref 5–15)
BUN: 21 mg/dL (ref 8–23)
CO2: 27 mmol/L (ref 22–32)
Calcium: 7.6 mg/dL — ABNORMAL LOW (ref 8.9–10.3)
Chloride: 104 mmol/L (ref 98–111)
Creatinine, Ser: 1.62 mg/dL — ABNORMAL HIGH (ref 0.44–1.00)
GFR, Estimated: 33 mL/min — ABNORMAL LOW (ref >60)
Glucose, Bld: 100 mg/dL — ABNORMAL HIGH (ref 70–99)
Potassium: 3.6 mmol/L (ref 3.5–5.1)
Sodium: 139 mmol/L (ref 135–145)

## 2022-04-24 LAB — URINE CULTURE: Culture: <10000 — AB

## 2022-04-24 LAB — GLUCOSE, CAPILLARY: Glucose-Capillary: 101 mg/dL — ABNORMAL HIGH (ref 70–99)

## 2022-04-24 MED ORDER — VANCOMYCIN HCL 1500 MG/300ML IV SOLN
1500.0000 mg | INTRAVENOUS | Status: DC
  Administered 2022-04-24: 1500 mg via INTRAVENOUS
  Filled 2022-04-24: qty 300

## 2022-04-24 MED ORDER — EZETIMIBE 10 MG PO TABS
10.0000 mg | ORAL_TABLET | Freq: Every day | ORAL | Status: DC
  Administered 2022-04-24 – 2022-04-26 (×3): 10 mg via ORAL
  Filled 2022-04-24 (×3): qty 1

## 2022-04-24 MED ORDER — SODIUM CHLORIDE 0.9 % IV SOLN
INTRAVENOUS | Status: AC
Start: 2022-04-24 — End: 2022-04-24

## 2022-04-24 MED ORDER — CARVEDILOL 3.125 MG PO TABS
3.1250 mg | ORAL_TABLET | Freq: Two times a day (BID) | ORAL | Status: DC
Start: 2022-04-24 — End: 2022-04-26
  Administered 2022-04-24 – 2022-04-26 (×4): 3.125 mg via ORAL
  Filled 2022-04-24 (×4): qty 1

## 2022-04-24 NOTE — Consult Note (Signed)
Date of Admission:  04/21/2022   Total days of antibiotics: 2 vanco/ceftriaxone               Reason for Consult: Pyelonephritis    Referring Provider: Eliseo Squires   Assessment: Pyelonephritis CAD HFmrEF CKD IV Urothelial Ca  Plan: Continue vanco Stop ceftriaxone Await her TTE  Comment Endocarditis would be unusual with these organisms.  Not unexpected with her manipulations.  Appreciate Uro f/u.   Thank you so much for this interesting consult,  Principal Problem:   Severe sepsis ([place]) Active Problems:   Acute respiratory failure with hypoxia ([place])   HFrEF (heart failure with reduced ejection fraction) (HCC)   Pyelonephritis   Acute pulmonary embolism (HCC)   Urothelial carcinoma (HCC)    apixaban  10 mg Oral BID   Followed by   Derrill Memo ON 04/29/2022] apixaban  5 mg Oral BID   aspirin EC  81 mg Oral Daily   calcium carbonate  1 tablet Oral q morning   carvedilol  3.125 mg Oral BID WC   cholecalciferol  1,000 Units Oral Daily   docusate sodium  100 mg Oral BID   ezetimibe  10 mg Oral Q lunch   feeding supplement  237 mL Oral TID BM   isosorbide mononitrate  15 mg Oral Daily   multivitamin with minerals  1 tablet Oral Daily   rosuvastatin  40 mg Oral Daily   sertraline  50 mg Oral Daily    HPI: [name] is a 76 y.o. female with hx of urothelial carcinoma (2020), ureterectomy with recurrence. She had L ureter stent replaced 6-1, replaced 6-15. She was treated for UTI x2 since then.  She comes to hospital on 7-14 sith fever, n/v, L flannk pain, weakness.  In ED her temp was 103, she was hypotensive, and her WBC was 17.3. Her BCx have since grown S hominis and S capitis. 3/4 bottles.  She was also found to have a ? RLL PE on CT.    Currently states she feels better.   Last Chemo was in June per husband.    Review of Systems: Review of Systems  Constitutional:  Positive for fever.  Respiratory:  Positive for cough.   Gastrointestinal:  Positive  for constipation. Negative for diarrhea.  Genitourinary:  Positive for flank pain.    Past Medical History:  Diagnosis Date   Anxiety    Bilateral swelling of feet 03/11/2022   bilateral feet edema much improved per pt on 03-20-2022, bilateral foot edema resolves after propping both feet up   CAD in native artery    a. 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   CHF (congestive heart failure) ([place])    Chronic kidney disease 06/2019   acute renal insufficiency sees dr Dorina Hoyer, ckd stage 3 per pt   COVID 09/2020   tired sore throat nausea hair fell out loss of taste and smell x 10 days, all symptoms resolved except still has loss of taste and smell   Depression    GERD (gastroesophageal reflux disease)    Hyperlipidemia    Hypertension     Social History   Tobacco Use   Smoking status: Former    Packs/day: 0.50    Years: 38.00    Total pack years: 19.00    Types: Cigarettes    Quit date: 11/26/2003    Years  since quitting: 18.4   Smokeless tobacco: Never  Vaping Use   Vaping Use: Never used  Substance Use Topics   Alcohol use: No    Alcohol/week: 0.0 standard drinks of alcohol   Drug use: No    Family History  Problem Relation Age of Onset   Hypertension Mother    Diabetes Mellitus II Sister    Hypertension Sister    Hypertension Brother      Medications: Scheduled:  apixaban  10 mg Oral BID   Followed by   Derrill Memo ON 04/29/2022] apixaban  5 mg Oral BID   aspirin EC  81 mg Oral Daily   calcium carbonate  1 tablet Oral q morning   carvedilol  3.125 mg Oral BID WC   cholecalciferol  1,000 Units Oral Daily   docusate sodium  100 mg Oral BID   ezetimibe  10 mg Oral Q lunch   feeding supplement  237 mL Oral TID BM   isosorbide mononitrate  15 mg Oral Daily   multivitamin with minerals  1 tablet Oral Daily   rosuvastatin  40 mg Oral Daily   sertraline  50 mg Oral Daily    Abtx:  Anti-infectives  (From admission, onward)    Start     Dose/Rate Route Frequency Ordered Stop   04/24/22 0800  vancomycin (VANCOCIN) IVPB 1000 mg/200 mL premix  Status:  Discontinued        1,000 mg 200 mL/hr over 60 Minutes Intravenous Every 36 hours 04/23/22 1015 04/23/22 1300   04/23/22 2000  vancomycin (VANCOREADY) IVPB 750 mg/150 mL  Status:  Discontinued        750 mg 150 mL/hr over 60 Minutes Intravenous Every 24 hours 04/22/22 1932 04/23/22 1003   04/22/22 1945  vancomycin (VANCOREADY) IVPB 1500 mg/300 mL        1,500 mg 150 mL/hr over 120 Minutes Intravenous NOW 04/22/22 1859 04/22/22 2200   04/21/22 1900  cefTRIAXone (ROCEPHIN) 2 g in sodium chloride 0.9 % 100 mL IVPB        2 g 200 mL/hr over 30 Minutes Intravenous Every 24 hours 04/21/22 1848 04/28/22 1759         OBJECTIVE: Blood pressure (!) 110/49, pulse 63, temperature 97.7 F (36.5 C), temperature source Oral, resp. rate 18, height '5\' 3"'$  (1.6 m), weight 78 kg, SpO2 93 %.  Physical Exam Constitutional:      Appearance: Normal appearance. She is obese.  HENT:     Mouth/Throat:     Mouth: Mucous membranes are moist.     Pharynx: No oropharyngeal exudate.  Eyes:     Extraocular Movements: Extraocular movements intact.     Pupils: Pupils are equal, round, and reactive to light.  Cardiovascular:     Rate and Rhythm: Normal rate and regular rhythm.  Pulmonary:     Effort: Pulmonary effort is normal.     Breath sounds: Normal breath sounds.  Abdominal:     General: Bowel sounds are normal. There is no distension.     Palpations: Abdomen is soft.     Tenderness: There is no abdominal tenderness.  Musculoskeletal:     Cervical back: Normal range of motion and neck supple.     Right lower leg: No edema.     Left lower leg: No edema.  Neurological:     Mental Status: She is alert.  Psychiatric:        Mood and Affect: Mood normal.     Lab Results Results  for orders placed or performed during the hospital encounter of  04/21/22 (from the past 48 hour(s))  Creatinine, serum     Status: Abnormal   Collection Time: 04/23/22  3:46 AM  Result Value Ref Range   Creatinine, Ser 1.67 (H) 0.44 - 1.00 mg/dL   GFR, Estimated 32 (L) >60 mL/min    Comment: (NOTE) Calculated using the CKD-EPI Creatinine Equation (2021) Performed at [facility], [place] [address], [place], [place] [address]   Glucose, capillary     Status: Abnormal   Collection Time: 04/23/22  8:32 AM  Result Value Ref Range   Glucose-Capillary 100 (H) 70 - 99 mg/dL    Comment: Glucose reference range applies only to samples taken after fasting for at least 8 hours.  Basic metabolic panel     Status: Abnormal   Collection Time: 04/24/22  4:07 AM  Result Value Ref Range   Sodium 139 135 - 145 mmol/L   Potassium 3.6 3.5 - 5.1 mmol/L   Chloride 104 98 - 111 mmol/L   CO2 27 22 - 32 mmol/L   Glucose, Bld 100 (H) 70 - 99 mg/dL    Comment: Glucose reference range applies only to samples taken after fasting for at least 8 hours.   BUN 21 8 - 23 mg/dL   Creatinine, Ser 1.62 (H) 0.44 - 1.00 mg/dL   Calcium 7.6 (L) 8.9 - 10.3 mg/dL   GFR, Estimated 33 (L) >60 mL/min    Comment: (NOTE) Calculated using the CKD-EPI Creatinine Equation (2021)    Anion gap 8 5 - 15    Comment: Performed at [facility], [place] [address], [place], [place] [address]  CBC with Differential/Platelet     Status: Abnormal   Collection Time: 04/24/22  4:07 AM  Result Value Ref Range   WBC 10.0 4.0 - 10.5 K/uL   RBC 2.73 (L) 3.87 - 5.11 MIL/uL   Hemoglobin 8.1 (L) 12.0 - 15.0 g/dL   HCT 25.5 (L) 36.0 - 46.0 %   MCV 93.4 80.0 - 100.0 fL   MCH 29.7 26.0 - 34.0 pg   MCHC 31.8 30.0 - 36.0 g/dL   RDW 17.5 (H) 11.5 - 15.5 %   Platelets 195 150 - 400 K/uL   nRBC 0.0 0.0 - 0.2 %   Neutrophils Relative % 75 %   Neutro Abs 7.5 1.7 - 7.7 K/uL   Lymphocytes Relative 12 %   Lymphs Abs 1.2 0.7 - 4.0 K/uL   Monocytes Relative 11 %   Monocytes  Absolute 1.1 (H) 0.1 - 1.0 K/uL   Eosinophils Relative 1 %   Eosinophils Absolute 0.1 0.0 - 0.5 K/uL   Basophils Relative 0 %   Basophils Absolute 0.0 0.0 - 0.1 K/uL   Immature Granulocytes 1 %   Abs Immature Granulocytes 0.05 0.00 - 0.07 K/uL    Comment: Performed at [facility], [place] [address] [place], [place] [address]  Glucose, capillary     Status: Abnormal   Collection Time: 04/24/22  8:55 AM  Result Value Ref Range   Glucose-Capillary 101 (H) 70 - 99 mg/dL    Comment: Glucose reference range applies only to samples taken after fasting for at least 8 hours.      Component Value Date/Time   SDES  04/22/2022 0031    IN/OUT CATH URINE Performed at [facility], [place] [address], [place], [place] [address]    SPECREQUEST  04/22/2022 [phone]  NONE Performed at [facility], [place] [address], [place], [place] [address]    CULT MULTIPLE SPECIES PRESENT, SUGGEST RECOLLECTION (A) 04/22/2022 0031   REPTSTATUS 04/23/2022 FINAL 04/22/2022 0031   VAS [place] LOWER EXTREMITY VENOUS (DVT)  Result Date: 04/24/2022  Lower Venous DVT Study Patient Name:  [name]  Date of Exam:   04/24/2022 Medical Rec #: [id number]          Accession #:    [id number] Date of Birth: 1946-09-25          Patient Gender: F Patient Age:   71 years Exam Location:  [facility] Procedure:      VAS [place] LOWER EXTREMITY VENOUS (DVT) Referring Phys: Eulogio Bear --------------------------------------------------------------------------------  Indications: Pulmonary embolism.  Risk Factors: Confirmed PE. Comparison Study: No prior studies. Performing Technologist: Oliver Hum RVT  Examination Guidelines: A complete evaluation includes B-mode imaging, spectral Doppler, color Doppler, and power Doppler as needed of all accessible portions of each vessel. Bilateral testing is considered an integral part of a complete examination. Limited examinations for  reoccurring indications may be performed as noted. The reflux portion of the exam is performed with the patient in reverse Trendelenburg.  +---------+---------------+---------+-----------+----------+--------------+ RIGHT    CompressibilityPhasicitySpontaneityPropertiesThrombus Aging +---------+---------------+---------+-----------+----------+--------------+ CFV      Full           Yes      Yes                                 +---------+---------------+---------+-----------+----------+--------------+ SFJ      Full                                                        +---------+---------------+---------+-----------+----------+--------------+ FV Prox  Full                                                        +---------+---------------+---------+-----------+----------+--------------+ FV Mid   Full                                                        +---------+---------------+---------+-----------+----------+--------------+ FV DistalFull                                                        +---------+---------------+---------+-----------+----------+--------------+ PFV      Full                                                        +---------+---------------+---------+-----------+----------+--------------+ POP      Full           Yes  Yes                                 +---------+---------------+---------+-----------+----------+--------------+ PTV      Full                                                        +---------+---------------+---------+-----------+----------+--------------+ PERO     Full                                                        +---------+---------------+---------+-----------+----------+--------------+   +---------+---------------+---------+-----------+----------+--------------+ LEFT     CompressibilityPhasicitySpontaneityPropertiesThrombus Aging  +---------+---------------+---------+-----------+----------+--------------+ CFV      Full           Yes      Yes                                 +---------+---------------+---------+-----------+----------+--------------+ SFJ      Full                                                        +---------+---------------+---------+-----------+----------+--------------+ FV Prox  Full                                                        +---------+---------------+---------+-----------+----------+--------------+ FV Mid   Full                                                        +---------+---------------+---------+-----------+----------+--------------+ FV DistalFull                                                        +---------+---------------+---------+-----------+----------+--------------+ PFV      Full                                                        +---------+---------------+---------+-----------+----------+--------------+ POP      Full           Yes      Yes                                 +---------+---------------+---------+-----------+----------+--------------+ PTV      Full                                                        +---------+---------------+---------+-----------+----------+--------------+  PERO     Full                                                        +---------+---------------+---------+-----------+----------+--------------+     Summary: RIGHT: - There is no evidence of deep vein thrombosis in the lower extremity.  - No cystic structure found in the popliteal fossa.  LEFT: - There is no evidence of deep vein thrombosis in the lower extremity.  - No cystic structure found in the popliteal fossa.  *See table(s) above for measurements and observations. Electronically signed by Harold Barban MD on 04/24/2022 at 1:33:51 PM.    Final    Recent Results (from the past 240 hour(s))  Blood Culture (routine x 2)     Status:  Abnormal (Preliminary result)   Collection Time: 04/21/22  7:05 PM   Specimen: BLOOD  Result Value Ref Range Status   Specimen Description   Final    BLOOD BLOOD LEFT FOREARM Performed at [facility], [place] [address] [place], [place] [address]    Special Requests   Final    BOTTLES DRAWN AEROBIC AND ANAEROBIC Blood Culture adequate volume Performed at [place] [address] [place], [place] [address]    Culture  Setup Time   Final    GRAM POSITIVE COCCI IN CLUSTERS IN BOTH AEROBIC AND ANAEROBIC BOTTLES CRITICAL RESULT CALLED TO, READ BACK BY AND VERIFIED WITH: PHARMD M.STEWYNE AT 1850 ON 04/22/2022 BY T.SAAD.    Culture (A)  Final    STAPHYLOCOCCUS CAPITIS THE SIGNIFICANCE OF ISOLATING THIS ORGANISM FROM A SINGLE SET OF BLOOD CULTURES WHEN MULTIPLE SETS ARE DRAWN IS UNCERTAIN. PLEASE NOTIFY THE MICROBIOLOGY DEPARTMENT WITHIN ONE WEEK IF SPECIATION AND SENSITIVITIES ARE REQUIRED. STAPHYLOCOCCUS HOMINIS SUSCEPTIBILITIES TO FOLLOW Performed at [facility], [place] [address], [place], [place] [address]    Report Status PENDING  Incomplete  Blood Culture ID Panel (Reflexed)     Status: Abnormal   Collection Time: 04/21/22  7:05 PM  Result Value Ref Range Status   Enterococcus faecalis NOT DETECTED NOT DETECTED Final   Enterococcus Faecium NOT DETECTED NOT DETECTED Final   Listeria monocytogenes NOT DETECTED NOT DETECTED Final   Staphylococcus species DETECTED (A) NOT DETECTED Final    Comment: CRITICAL RESULT CALLED TO, READ BACK BY AND VERIFIED WITH: PHARMD M.STEWYNE AT 1850 ON 04/22/2022 BY T.SAAD.    Staphylococcus aureus (BCID) NOT DETECTED NOT DETECTED Final   Staphylococcus epidermidis DETECTED (A) NOT DETECTED Final    Comment: Methicillin (oxacillin) resistant coagulase negative staphylococcus. Possible blood culture contaminant (unless isolated from more than one blood culture draw or clinical case suggests pathogenicity). No  antibiotic treatment is indicated for blood  culture contaminants. CRITICAL RESULT CALLED TO, READ BACK BY AND VERIFIED WITH: PHARMD M.STEWYNE AT 1850 ON 04/22/2022 BY T.SAAD.    Staphylococcus lugdunensis NOT DETECTED NOT DETECTED Final   Streptococcus species NOT DETECTED NOT DETECTED Final   Streptococcus agalactiae NOT DETECTED NOT DETECTED Final   Streptococcus pneumoniae NOT DETECTED NOT DETECTED Final   Streptococcus pyogenes NOT DETECTED NOT DETECTED Final   A.calcoaceticus-baumannii NOT DETECTED NOT DETECTED Final   Bacteroides fragilis NOT DETECTED NOT DETECTED Final   Enterobacterales NOT DETECTED NOT DETECTED Final   Enterobacter cloacae complex NOT DETECTED NOT DETECTED Final  Escherichia coli NOT DETECTED NOT DETECTED Final   Klebsiella aerogenes NOT DETECTED NOT DETECTED Final   Klebsiella oxytoca NOT DETECTED NOT DETECTED Final   Klebsiella pneumoniae NOT DETECTED NOT DETECTED Final   Proteus species NOT DETECTED NOT DETECTED Final   Salmonella species NOT DETECTED NOT DETECTED Final   Serratia marcescens NOT DETECTED NOT DETECTED Final   Haemophilus influenzae NOT DETECTED NOT DETECTED Final   Neisseria meningitidis NOT DETECTED NOT DETECTED Final   Pseudomonas aeruginosa NOT DETECTED NOT DETECTED Final   Stenotrophomonas maltophilia NOT DETECTED NOT DETECTED Final   Candida albicans NOT DETECTED NOT DETECTED Final   Candida auris NOT DETECTED NOT DETECTED Final   Candida glabrata NOT DETECTED NOT DETECTED Final   Candida krusei NOT DETECTED NOT DETECTED Final   Candida parapsilosis NOT DETECTED NOT DETECTED Final   Candida tropicalis NOT DETECTED NOT DETECTED Final   Cryptococcus neoformans/gattii NOT DETECTED NOT DETECTED Final   Methicillin resistance mecA/C DETECTED (A) NOT DETECTED Final    Comment: CRITICAL RESULT CALLED TO, READ BACK BY AND VERIFIED WITH: PHARMD M.STEWYNE AT 1850 ON 04/22/2022 BY T.SAAD. Performed at [facility], [place] [address] [place], [place] [address]   Blood Culture (routine x 2)     Status: Abnormal (Preliminary result)   Collection Time: 04/21/22  7:10 PM   Specimen: BLOOD RIGHT FOREARM  Result Value Ref Range Status   Specimen Description   Final    BLOOD RIGHT FOREARM Performed at [facility], [place] [address], [place], [place] [address]    Special Requests   Final    BOTTLES DRAWN AEROBIC ONLY Blood Culture adequate volume Performed at [place] [address] [place], [place] [address]    Culture  Setup Time   Final    GRAM POSITIVE COCCI IN CLUSTERS AEROBIC BOTTLE ONLY Performed at [facility], [place] [address], [place], [place] [address]    Culture (A)  Final    STAPHYLOCOCCUS EPIDERMIDIS STAPHYLOCOCCUS HOMINIS    Report Status PENDING  Incomplete  Resp Panel by RT-PCR (Flu A&B, Covid) Anterior Nasal Swab     Status: None   Collection Time: 04/21/22  7:37 PM   Specimen: Anterior Nasal Swab  Result Value Ref Range Status   SARS Coronavirus 2 by RT PCR NEGATIVE NEGATIVE Final    Comment: (NOTE) SARS-CoV-2 target nucleic acids are NOT DETECTED.  The SARS-CoV-2 RNA is generally detectable in upper respiratory specimens during the acute phase of infection. The lowest concentration of SARS-CoV-2 viral copies this assay can detect is 138 copies/mL. A negative result does not preclude SARS-Cov-2 infection and should not be used as the sole basis for treatment or other patient management decisions. A negative result may occur with  improper specimen collection/handling, submission of specimen other than nasopharyngeal swab, presence of viral mutation(s) within the areas targeted by this assay, and inadequate number of viral copies(<138 copies/mL). A negative result must be combined with clinical observations, patient history, and epidemiological information. The expected result is Negative.  Fact Sheet for Patients:   EntrepreneurPulse.com.au  Fact Sheet for Healthcare Providers:  IncredibleEmployment.be  This test is no t yet approved or cleared by the [place] FDA and  has been authorized for detection and/or diagnosis of SARS-CoV-2 by FDA under an Emergency Use Authorization (EUA). This EUA will remain  in effect (meaning this test can be used) for the duration of the COVID-19 declaration under Section 564(b)(1) of the Act,  21 U.S.C.section 360bbb-3(b)(1), unless the authorization is terminated  or revoked sooner.       Influenza A by PCR NEGATIVE NEGATIVE Final   Influenza B by PCR NEGATIVE NEGATIVE Final    Comment: (NOTE) The Xpert Xpress SARS-CoV-2/FLU/RSV plus assay is intended as an aid in the diagnosis of influenza from Nasopharyngeal swab specimens and should not be used as a sole basis for treatment. Nasal washings and aspirates are unacceptable for Xpert Xpress SARS-CoV-2/FLU/RSV testing.  Fact Sheet for Patients: EntrepreneurPulse.com.au  Fact Sheet for Healthcare Providers: IncredibleEmployment.be  This test is not yet approved or cleared by the [place] FDA and has been authorized for detection and/or diagnosis of SARS-CoV-2 by FDA under an Emergency Use Authorization (EUA). This EUA will remain in effect (meaning this test can be used) for the duration of the COVID-19 declaration under Section 564(b)(1) of the Act, 21 U.S.C. section 360bbb-3(b)(1), unless the authorization is terminated or revoked.  Performed at [facility], [place] [address], [place], [place] [address]   Urine Culture     Status: Abnormal   Collection Time: 04/22/22 12:31 AM   Specimen: In/Out Cath Urine  Result Value Ref Range Status   Specimen Description   Final    IN/OUT CATH URINE Performed at [place] [address] [place], [place] [address]    Special Requests   Final     NONE Performed at [facility], [place] [address] [place], [place] [address]    Culture MULTIPLE SPECIES PRESENT, SUGGEST RECOLLECTION (A)  Final   Report Status 04/23/2022 FINAL  Final    Microbiology: Recent Results (from the past 240 hour(s))  Blood Culture (routine x 2)     Status: Abnormal (Preliminary result)   Collection Time: 04/21/22  7:05 PM   Specimen: BLOOD  Result Value Ref Range Status   Specimen Description   Final    BLOOD BLOOD LEFT FOREARM Performed at [place] [address] [place], [place] [address]    Special Requests   Final    BOTTLES DRAWN AEROBIC AND ANAEROBIC Blood Culture adequate volume Performed at [place] [address], [place], [place] [address]    Culture  Setup Time   Final    GRAM POSITIVE COCCI IN CLUSTERS IN BOTH AEROBIC AND ANAEROBIC BOTTLES CRITICAL RESULT CALLED TO, READ BACK BY AND VERIFIED WITH: PHARMD M.STEWYNE AT 1850 ON 04/22/2022 BY T.SAAD.    Culture (A)  Final    STAPHYLOCOCCUS CAPITIS THE SIGNIFICANCE OF ISOLATING THIS ORGANISM FROM A SINGLE SET OF BLOOD CULTURES WHEN MULTIPLE SETS ARE DRAWN IS UNCERTAIN. PLEASE NOTIFY THE MICROBIOLOGY DEPARTMENT WITHIN ONE WEEK IF SPECIATION AND SENSITIVITIES ARE REQUIRED. STAPHYLOCOCCUS HOMINIS SUSCEPTIBILITIES TO FOLLOW Performed at [facility], [place] [address] [place], [place] [address]    Report Status PENDING  Incomplete  Blood Culture ID Panel (Reflexed)     Status: Abnormal   Collection Time: 04/21/22  7:05 PM  Result Value Ref Range Status   Enterococcus faecalis NOT DETECTED NOT DETECTED Final   Enterococcus Faecium NOT DETECTED NOT DETECTED Final   Listeria monocytogenes NOT DETECTED NOT DETECTED Final   Staphylococcus species DETECTED (A) NOT DETECTED Final    Comment: CRITICAL RESULT CALLED TO, READ BACK BY AND VERIFIED WITH: PHARMD M.STEWYNE AT 1850 ON 04/22/2022 BY T.SAAD.    Staphylococcus aureus  (BCID) NOT DETECTED NOT DETECTED Final   Staphylococcus epidermidis DETECTED (A) NOT DETECTED Final  Comment: Methicillin (oxacillin) resistant coagulase negative staphylococcus. Possible blood culture contaminant (unless isolated from more than one blood culture draw or clinical case suggests pathogenicity). No antibiotic treatment is indicated for blood  culture contaminants. CRITICAL RESULT CALLED TO, READ BACK BY AND VERIFIED WITH: PHARMD M.STEWYNE AT 1850 ON 04/22/2022 BY T.SAAD.    Staphylococcus lugdunensis NOT DETECTED NOT DETECTED Final   Streptococcus species NOT DETECTED NOT DETECTED Final   Streptococcus agalactiae NOT DETECTED NOT DETECTED Final   Streptococcus pneumoniae NOT DETECTED NOT DETECTED Final   Streptococcus pyogenes NOT DETECTED NOT DETECTED Final   A.calcoaceticus-baumannii NOT DETECTED NOT DETECTED Final   Bacteroides fragilis NOT DETECTED NOT DETECTED Final   Enterobacterales NOT DETECTED NOT DETECTED Final   Enterobacter cloacae complex NOT DETECTED NOT DETECTED Final   Escherichia coli NOT DETECTED NOT DETECTED Final   Klebsiella aerogenes NOT DETECTED NOT DETECTED Final   Klebsiella oxytoca NOT DETECTED NOT DETECTED Final   Klebsiella pneumoniae NOT DETECTED NOT DETECTED Final   Proteus species NOT DETECTED NOT DETECTED Final   Salmonella species NOT DETECTED NOT DETECTED Final   Serratia marcescens NOT DETECTED NOT DETECTED Final   Haemophilus influenzae NOT DETECTED NOT DETECTED Final   Neisseria meningitidis NOT DETECTED NOT DETECTED Final   Pseudomonas aeruginosa NOT DETECTED NOT DETECTED Final   Stenotrophomonas maltophilia NOT DETECTED NOT DETECTED Final   Candida albicans NOT DETECTED NOT DETECTED Final   Candida auris NOT DETECTED NOT DETECTED Final   Candida glabrata NOT DETECTED NOT DETECTED Final   Candida krusei NOT DETECTED NOT DETECTED Final   Candida parapsilosis NOT DETECTED NOT DETECTED Final   Candida tropicalis NOT DETECTED NOT  DETECTED Final   Cryptococcus neoformans/gattii NOT DETECTED NOT DETECTED Final   Methicillin resistance mecA/C DETECTED (A) NOT DETECTED Final    Comment: CRITICAL RESULT CALLED TO, READ BACK BY AND VERIFIED WITH: PHARMD M.STEWYNE AT 1850 ON 04/22/2022 BY T.SAAD. Performed at [facility], [place] [address], [place], [place] [address]   Blood Culture (routine x 2)     Status: Abnormal (Preliminary result)   Collection Time: 04/21/22  7:10 PM   Specimen: BLOOD RIGHT FOREARM  Result Value Ref Range Status   Specimen Description   Final    BLOOD RIGHT FOREARM Performed at [facility], [place] [address], [place], [place] [address]    Special Requests   Final    BOTTLES DRAWN AEROBIC ONLY Blood Culture adequate volume Performed at [place] [address] [place], [place] [address]    Culture  Setup Time   Final    GRAM POSITIVE COCCI IN CLUSTERS AEROBIC BOTTLE ONLY Performed at [facility], [place] [address] [place], [place] [address]    Culture (A)  Final    STAPHYLOCOCCUS EPIDERMIDIS STAPHYLOCOCCUS HOMINIS    Report Status PENDING  Incomplete  Resp Panel by RT-PCR (Flu A&B, Covid) Anterior Nasal Swab     Status: None   Collection Time: 04/21/22  7:37 PM   Specimen: Anterior Nasal Swab  Result Value Ref Range Status   SARS Coronavirus 2 by RT PCR NEGATIVE NEGATIVE Final    Comment: (NOTE) SARS-CoV-2 target nucleic acids are NOT DETECTED.  The SARS-CoV-2 RNA is generally detectable in upper respiratory specimens during the acute phase of infection. The lowest concentration of SARS-CoV-2 viral copies this assay can detect is 138 copies/mL. A negative result does not preclude SARS-Cov-2 infection and should not be used as the sole basis for  treatment or other patient management decisions. A negative result may occur with  improper specimen collection/handling, submission of specimen other than nasopharyngeal swab, presence of viral  mutation(s) within the areas targeted by this assay, and inadequate number of viral copies(<138 copies/mL). A negative result must be combined with clinical observations, patient history, and epidemiological information. The expected result is Negative.  Fact Sheet for Patients:  EntrepreneurPulse.com.au  Fact Sheet for Healthcare Providers:  IncredibleEmployment.be  This test is no t yet approved or cleared by the [place] FDA and  has been authorized for detection and/or diagnosis of SARS-CoV-2 by FDA under an Emergency Use Authorization (EUA). This EUA will remain  in effect (meaning this test can be used) for the duration of the COVID-19 declaration under Section 564(b)(1) of the Act, 21 U.S.C.section 360bbb-3(b)(1), unless the authorization is terminated  or revoked sooner.       Influenza A by PCR NEGATIVE NEGATIVE Final   Influenza B by PCR NEGATIVE NEGATIVE Final    Comment: (NOTE) The Xpert Xpress SARS-CoV-2/FLU/RSV plus assay is intended as an aid in the diagnosis of influenza from Nasopharyngeal swab specimens and should not be used as a sole basis for treatment. Nasal washings and aspirates are unacceptable for Xpert Xpress SARS-CoV-2/FLU/RSV testing.  Fact Sheet for Patients: EntrepreneurPulse.com.au  Fact Sheet for Healthcare Providers: IncredibleEmployment.be  This test is not yet approved or cleared by the [place] FDA and has been authorized for detection and/or diagnosis of SARS-CoV-2 by FDA under an Emergency Use Authorization (EUA). This EUA will remain in effect (meaning this test can be used) for the duration of the COVID-19 declaration under Section 564(b)(1) of the Act, 21 U.S.C. section 360bbb-3(b)(1), unless the authorization is terminated or revoked.  Performed at [facility], [place] [address], [place], [place] [address]   Urine Culture      Status: Abnormal   Collection Time: 04/22/22 12:31 AM   Specimen: In/Out Cath Urine  Result Value Ref Range Status   Specimen Description   Final    IN/OUT CATH URINE Performed at [facility] [address], [place], [place] [address]    Special Requests   Final    NONE Performed at [facility], [place] [address] [place], [place] [address]    Culture MULTIPLE SPECIES PRESENT, SUGGEST RECOLLECTION (A)  Final   Report Status 04/23/2022 FINAL  Final    Radiographs and labs were personally reviewed by me.   Bobby Rumpf, MD [facility] [place] [facility] [phone] 04/24/2022, 2:39 PM

## 2022-04-24 NOTE — Progress Notes (Signed)
Progress Note    FILICIA SCOGIN  HKV:[id number] DOB: [birth year]  DOA: 04/21/2022 PCP: [name], MD      Brief Narrative:    Medical records reviewed and are as summarized below:  [name] is a 76 y.o. female with medical history significant of moderate nonobstructive CAD (70% Diag disease 11/05/20), HFmrEF, HTN ,CKD IV, depression , GERD, urothelial carcinoma s/p ureterectomy further complicated by recurrence on left for which she is followed by urology and oncology.  Left ureteral stent was removed on 03/09/2022 and left ureteral stent was placed on 03/23/2022.  She said she has had 2 rounds of antibiotics for UTIs since that time. Further interim history notes last chemo therapy 2 weeks ago.  She said chemotherapy has been discontinued because of intolerance to treatment.  She presented to the hospital because of fever, nausea, vomiting, cough, left flank pain, generalized weakness and a fall at home.  Found to have ? Bacteremia and a ? PE.        Assessment/Plan:   Principal Problem:   Severe sepsis (HCC) Active Problems:   Acute respiratory failure with hypoxia (HCC)   HFrEF (heart failure with reduced ejection fraction) (HCC)   Pyelonephritis   Acute pulmonary embolism (HCC)   Urothelial carcinoma (HCC)   Fever, severe sepsis secondary to acute left pyelonephritis, recent left ureteral stent placement on 03/23/2022, recent use of antibiotics for complicated UTI:   -Continue IV ceftriaxone.  Discontinue IV vancomycin.  - Dr. Mal Misty notes a consult from Dr. Tresa Moore, urologist but I do not see this -Of note, BCID was initially reported as Staph epidermidis.  Patient was empirically started on IV vancomycin.  ID was consulted.  However, blood culture eventually showed Staph hominis and Staph capitis -- defer to ID  Hypotension:  -d/c hydralazine -holding parameters on coreg   Acute right-sided pulmonary embolism:  -CT scan unimpressive but she was  started on Eliquis by prior MD -LE duplex negative as well -would treat for 3 months  Acute hypoxic respiratory failure:  -wean to RA  Urothelial carcinoma: Most recent chemotherapy was about 2 weeks prior to admission.  Patient said treatment has been discontinued because of intolerance to treatment.  Outpatient follow-up with urologist and oncologist.  CKD stage IV: Creatinine is stable -baseline CR around 1.3  Chronic diastolic CHF: Compensated.  2D echo in May 2022 showed EF estimated at 70 to 56%, grade 1 diastolic dysfunction. -appears dry, will give gentle IVF   Obesity Estimated body mass index is 30.46 kg/m as calculated from the following:   Height as of this encounter: '5\' 3"'$  (1.6 m).   Weight as of this encounter: 78 kg.      Ambulate patient    Consultants: ID to see Does not appear urology was actually consulted      Medications:    apixaban  10 mg Oral BID   Followed by   Derrill Memo ON 04/29/2022] apixaban  5 mg Oral BID   aspirin EC  81 mg Oral Daily   calcium carbonate  1 tablet Oral q morning   carvedilol  3.125 mg Oral BID WC   cholecalciferol  1,000 Units Oral Daily   docusate sodium  100 mg Oral BID   ezetimibe  10 mg Oral Q lunch   feeding supplement  237 mL Oral TID BM   isosorbide mononitrate  15 mg Oral Daily   multivitamin with minerals  1 tablet Oral Daily  rosuvastatin  40 mg Oral Daily   sertraline  50 mg Oral Daily   Continuous Infusions:  sodium chloride 100 mL/hr at 04/24/22 0900   cefTRIAXone (ROCEPHIN)  IV 2 g (04/23/22 1757)     Anti-infectives (From admission, onward)    Start     Dose/Rate Route Frequency Ordered Stop   04/24/22 0800  vancomycin (VANCOCIN) IVPB 1000 mg/200 mL premix  Status:  Discontinued        1,000 mg 200 mL/hr over 60 Minutes Intravenous Every 36 hours 04/23/22 1015 04/23/22 1300   04/23/22 2000  vancomycin (VANCOREADY) IVPB 750 mg/150 mL  Status:  Discontinued        750 mg 150 mL/hr over 60  Minutes Intravenous Every 24 hours 04/22/22 1932 04/23/22 1003   04/22/22 1945  vancomycin (VANCOREADY) IVPB 1500 mg/300 mL        1,500 mg 150 mL/hr over 120 Minutes Intravenous NOW 04/22/22 1859 04/22/22 2200   04/21/22 1900  cefTRIAXone (ROCEPHIN) 2 g in sodium chloride 0.9 % 100 mL IVPB        2 g 200 mL/hr over 30 Minutes Intravenous Every 24 hours 04/21/22 1848 04/28/22 1759              Family Communication/Anticipated D/C date and plan/Code Status   DVT prophylaxis:  apixaban (ELIQUIS) tablet 10 mg  apixaban (ELIQUIS) tablet 5 mg     Code Status: Full Code  Family Communication: None Disposition Plan: Pending improvement and ID recs   Status is: Inpatient Remains inpatient appropriate because: On IV antibiotics       Subjective:   Feeling somewhat better  Objective:    Vitals:   04/23/22 1242 04/23/22 2109 04/23/22 2113 04/24/22 1036  BP: (!) 93/59 (!) 127/56 (!) 127/56 (!) 104/50  Pulse: 83  80 74  Resp: 18  18   Temp: 98.6 F (37 C)  99.5 F (37.5 C)   TempSrc: Oral  Oral   SpO2: 99%  97% 91%  Weight:      Height:       No data found.   Intake/Output Summary (Last 24 hours) at 04/24/2022 1057 Last data filed at 04/24/2022 0354 Gross per 24 hour  Intake 600 ml  Output 1800 ml  Net -1200 ml   Filed Weights   04/21/22 1933  Weight: 78 kg    Exam:   General: Appearance:    Obese female in no acute distress     Lungs:     respirations unlabored  Heart:    Normal heart rate.  MS:   All extremities are intact.   Neurologic:   Awake, alert      Data Reviewed:   I have personally reviewed following labs and imaging studies:  Labs: Labs show the following:   Basic Metabolic Panel: Recent Labs  Lab 04/21/22 1924 04/21/22 2139 04/22/22 0500 04/23/22 0346 04/24/22 0407  NA 139  --  137  --  139  K 3.6  --  3.7  --  3.6  CL 100  --  101  --  104  CO2 30  --  27  --  27  GLUCOSE 133*  --  113*  --  100*  BUN 12  --  14   --  21  CREATININE 1.39* 1.26* 1.29* 1.67* 1.62*  CALCIUM 8.7*  --  7.8*  --  7.6*   GFR Estimated Creatinine Clearance: 30.6 mL/min (A) (by C-G formula based on SCr of  1.62 mg/dL (H)). Liver Function Tests: Recent Labs  Lab 04/21/22 1924 04/22/22 0500  AST 19 51*  ALT 13 18  ALKPHOS 50 44  BILITOT 0.5 0.6  PROT 7.2 5.9*  ALBUMIN 3.0* 2.5*   No results for input(s): "LIPASE", "AMYLASE" in the last 168 hours. No results for input(s): "AMMONIA" in the last 168 hours. Coagulation profile Recent Labs  Lab 04/21/22 1924  INR 1.2    CBC: Recent Labs  Lab 04/21/22 1924 04/21/22 2139 04/22/22 0500 04/24/22 0407  WBC 17.3* 16.0* 16.3* 10.0  NEUTROABS 15.0*  --   --  7.5  HGB 10.2* 8.8* 8.7* 8.1*  HCT 31.6* 27.9* 27.4* 25.5*  MCV 93.2 93.3 93.2 93.4  PLT 261 208 188 195   Cardiac Enzymes: No results for input(s): "CKTOTAL", "CKMB", "CKMBINDEX", "TROPONINI" in the last 168 hours. BNP (last 3 results) Recent Labs    08/10/21 0835  PROBNP 157   CBG: Recent Labs  Lab 04/22/22 0736 04/23/22 0832 04/24/22 0855  GLUCAP 116* 100* 101*   D-Dimer: Recent Labs    04/21/22 2139  DDIMER 2.62*   Hgb A1c: Recent Labs    04/21/22 2220  HGBA1C 6.1*   Lipid Profile: No results for input(s): "CHOL", "HDL", "LDLCALC", "TRIG", "CHOLHDL", "LDLDIRECT" in the last 72 hours. Thyroid function studies: Recent Labs    04/21/22 2220  TSH 0.242*   Anemia work up: No results for input(s): "VITAMINB12", "FOLATE", "FERRITIN", "TIBC", "IRON", "RETICCTPCT" in the last 72 hours. Sepsis Labs: Recent Labs  Lab 04/21/22 1924 04/21/22 1925 04/21/22 2132 04/21/22 2139 04/21/22 2220 04/22/22 0139 04/22/22 0500 04/24/22 0407  PROCALCITON  --   --   --  0.25  --   --   --   --   WBC 17.3*  --   --  16.0*  --   --  16.3* 10.0  LATICACIDVEN  --  2.6* 1.4  --  1.0 1.3  --   --     Microbiology Recent Results (from the past 240 hour(s))  Blood Culture (routine x 2)     Status:  Abnormal (Preliminary result)   Collection Time: 04/21/22  7:05 PM   Specimen: BLOOD  Result Value Ref Range Status   Specimen Description   Final    BLOOD BLOOD LEFT FOREARM Performed at [facility], [place] [address], [place], [place] [address]    Special Requests   Final    BOTTLES DRAWN AEROBIC AND ANAEROBIC Blood Culture adequate volume Performed at [place] [address], [place], [place] [address]    Culture  Setup Time   Final    GRAM POSITIVE COCCI IN CLUSTERS IN BOTH AEROBIC AND ANAEROBIC BOTTLES CRITICAL RESULT CALLED TO, READ BACK BY AND VERIFIED WITH: PHARMD M.STEWYNE AT 1850 ON 04/22/2022 BY T.SAAD.    Culture (A)  Final    STAPHYLOCOCCUS CAPITIS THE SIGNIFICANCE OF ISOLATING THIS ORGANISM FROM A SINGLE SET OF BLOOD CULTURES WHEN MULTIPLE SETS ARE DRAWN IS UNCERTAIN. PLEASE NOTIFY THE MICROBIOLOGY DEPARTMENT WITHIN ONE WEEK IF SPECIATION AND SENSITIVITIES ARE REQUIRED. STAPHYLOCOCCUS HOMINIS SUSCEPTIBILITIES TO FOLLOW Performed at [facility], [place] [address], [place], [place] [address]    Report Status PENDING  Incomplete  Blood Culture ID Panel (Reflexed)     Status: Abnormal   Collection Time: 04/21/22  7:05 PM  Result Value Ref Range Status   Enterococcus faecalis NOT DETECTED NOT DETECTED Final   Enterococcus Faecium NOT DETECTED NOT DETECTED Final  Listeria monocytogenes NOT DETECTED NOT DETECTED Final   Staphylococcus species DETECTED (A) NOT DETECTED Final    Comment: CRITICAL RESULT CALLED TO, READ BACK BY AND VERIFIED WITH: PHARMD M.STEWYNE AT 1850 ON 04/22/2022 BY T.SAAD.    Staphylococcus aureus (BCID) NOT DETECTED NOT DETECTED Final   Staphylococcus epidermidis DETECTED (A) NOT DETECTED Final    Comment: Methicillin (oxacillin) resistant coagulase negative staphylococcus. Possible blood culture contaminant (unless isolated from more than one blood culture draw or clinical case suggests pathogenicity). No  antibiotic treatment is indicated for blood  culture contaminants. CRITICAL RESULT CALLED TO, READ BACK BY AND VERIFIED WITH: PHARMD M.STEWYNE AT 1850 ON 04/22/2022 BY T.SAAD.    Staphylococcus lugdunensis NOT DETECTED NOT DETECTED Final   Streptococcus species NOT DETECTED NOT DETECTED Final   Streptococcus agalactiae NOT DETECTED NOT DETECTED Final   Streptococcus pneumoniae NOT DETECTED NOT DETECTED Final   Streptococcus pyogenes NOT DETECTED NOT DETECTED Final   A.calcoaceticus-baumannii NOT DETECTED NOT DETECTED Final   Bacteroides fragilis NOT DETECTED NOT DETECTED Final   Enterobacterales NOT DETECTED NOT DETECTED Final   Enterobacter cloacae complex NOT DETECTED NOT DETECTED Final   Escherichia coli NOT DETECTED NOT DETECTED Final   Klebsiella aerogenes NOT DETECTED NOT DETECTED Final   Klebsiella oxytoca NOT DETECTED NOT DETECTED Final   Klebsiella pneumoniae NOT DETECTED NOT DETECTED Final   Proteus species NOT DETECTED NOT DETECTED Final   Salmonella species NOT DETECTED NOT DETECTED Final   Serratia marcescens NOT DETECTED NOT DETECTED Final   Haemophilus influenzae NOT DETECTED NOT DETECTED Final   Neisseria meningitidis NOT DETECTED NOT DETECTED Final   Pseudomonas aeruginosa NOT DETECTED NOT DETECTED Final   Stenotrophomonas maltophilia NOT DETECTED NOT DETECTED Final   Candida albicans NOT DETECTED NOT DETECTED Final   Candida auris NOT DETECTED NOT DETECTED Final   Candida glabrata NOT DETECTED NOT DETECTED Final   Candida krusei NOT DETECTED NOT DETECTED Final   Candida parapsilosis NOT DETECTED NOT DETECTED Final   Candida tropicalis NOT DETECTED NOT DETECTED Final   Cryptococcus neoformans/gattii NOT DETECTED NOT DETECTED Final   Methicillin resistance mecA/C DETECTED (A) NOT DETECTED Final    Comment: CRITICAL RESULT CALLED TO, READ BACK BY AND VERIFIED WITH: PHARMD M.STEWYNE AT 1850 ON 04/22/2022 BY T.SAAD. Performed at [facility], [place] [address] [place] [address], [place], [place] [address]   Blood Culture (routine x 2)     Status: Abnormal (Preliminary result)   Collection Time: 04/21/22  7:10 PM   Specimen: BLOOD  Result Value Ref Range Status   Specimen Description   Final    BLOOD BLOOD RIGHT FOREARM Performed at [place] [facility] [place] [address], [place], [place] [address]    Special Requests   Final    BOTTLES DRAWN AEROBIC ONLY Blood Culture adequate volume Performed at [place] [address] [place], [place] [address]    Culture  Setup Time   Final    GRAM POSITIVE COCCI IN CLUSTERS AEROBIC BOTTLE ONLY    Culture (A)  Final    STAPHYLOCOCCUS HOMINIS CULTURE REINCUBATED FOR BETTER GROWTH Performed at [facility], [place] [address] [place], [place] [address]    Report Status PENDING  Incomplete  Resp Panel by RT-PCR (Flu A&B, Covid) Anterior Nasal Swab     Status: None   Collection Time: 04/21/22  7:37 PM   Specimen: Anterior Nasal Swab  Result Value Ref Range Status   SARS Coronavirus 2 by RT PCR  NEGATIVE NEGATIVE Final    Comment: (NOTE) SARS-CoV-2 target nucleic acids are NOT DETECTED.  The SARS-CoV-2 RNA is generally detectable in upper respiratory specimens during the acute phase of infection. The lowest concentration of SARS-CoV-2 viral copies this assay can detect is 138 copies/mL. A negative result does not preclude SARS-Cov-2 infection and should not be used as the sole basis for treatment or other patient management decisions. A negative result may occur with  improper specimen collection/handling, submission of specimen other than nasopharyngeal swab, presence of viral mutation(s) within the areas targeted by this assay, and inadequate number of viral copies(<138 copies/mL). A negative result must be combined with clinical observations, patient history, and epidemiological information. The expected result is Negative.  Fact Sheet for Patients:   EntrepreneurPulse.com.au  Fact Sheet for Healthcare Providers:  IncredibleEmployment.be  This test is no t yet approved or cleared by the [place] FDA and  has been authorized for detection and/or diagnosis of SARS-CoV-2 by FDA under an Emergency Use Authorization (EUA). This EUA will remain  in effect (meaning this test can be used) for the duration of the COVID-19 declaration under Section 564(b)(1) of the Act, 21 U.S.C.section 360bbb-3(b)(1), unless the authorization is terminated  or revoked sooner.       Influenza A by PCR NEGATIVE NEGATIVE Final   Influenza B by PCR NEGATIVE NEGATIVE Final    Comment: (NOTE) The Xpert Xpress SARS-CoV-2/FLU/RSV plus assay is intended as an aid in the diagnosis of influenza from Nasopharyngeal swab specimens and should not be used as a sole basis for treatment. Nasal washings and aspirates are unacceptable for Xpert Xpress SARS-CoV-2/FLU/RSV testing.  Fact Sheet for Patients: EntrepreneurPulse.com.au  Fact Sheet for Healthcare Providers: IncredibleEmployment.be  This test is not yet approved or cleared by the [place] FDA and has been authorized for detection and/or diagnosis of SARS-CoV-2 by FDA under an Emergency Use Authorization (EUA). This EUA will remain in effect (meaning this test can be used) for the duration of the COVID-19 declaration under Section 564(b)(1) of the Act, 21 U.S.C. section 360bbb-3(b)(1), unless the authorization is terminated or revoked.  Performed at [facility], [place] [address], [place], [place] [address]   Urine Culture     Status: Abnormal   Collection Time: 04/22/22 12:31 AM   Specimen: In/Out Cath Urine  Result Value Ref Range Status   Specimen Description   Final    IN/OUT CATH URINE Performed at [place] [address], [place], [place] [address]    Special Requests   Final     NONE Performed at [facility], [place] [address] [place], [place] [address]    Culture MULTIPLE SPECIES PRESENT, SUGGEST RECOLLECTION (A)  Final   Report Status 04/23/2022 FINAL  Final    Procedures and diagnostic studies:  VAS [place] LOWER EXTREMITY VENOUS (DVT)  Result Date: 04/24/2022  Lower Venous DVT Study Patient Name:  [name]  Date of Exam:   04/24/2022 Medical Rec #: [id number]          Accession #:    [id number] Date of Birth: August 17, 1946          Patient Gender: F Patient Age:   56 years Exam Location:  [facility] Procedure:      VAS [place] LOWER EXTREMITY VENOUS (DVT) Referring Phys: Eulogio Bear --------------------------------------------------------------------------------  Indications: Pulmonary embolism.  Risk Factors: Confirmed PE. Comparison Study: No prior studies. Performing Technologist: Oliver Hum RVT  Examination Guidelines: A  complete evaluation includes B-mode imaging, spectral Doppler, color Doppler, and power Doppler as needed of all accessible portions of each vessel. Bilateral testing is considered an integral part of a complete examination. Limited examinations for reoccurring indications may be performed as noted. The reflux portion of the exam is performed with the patient in reverse Trendelenburg.  +---------+---------------+---------+-----------+----------+--------------+ RIGHT    CompressibilityPhasicitySpontaneityPropertiesThrombus Aging +---------+---------------+---------+-----------+----------+--------------+ CFV      Full           Yes      Yes                                 +---------+---------------+---------+-----------+----------+--------------+ SFJ      Full                                                        +---------+---------------+---------+-----------+----------+--------------+ FV Prox  Full                                                         +---------+---------------+---------+-----------+----------+--------------+ FV Mid   Full                                                        +---------+---------------+---------+-----------+----------+--------------+ FV DistalFull                                                        +---------+---------------+---------+-----------+----------+--------------+ PFV      Full                                                        +---------+---------------+---------+-----------+----------+--------------+ POP      Full           Yes      Yes                                 +---------+---------------+---------+-----------+----------+--------------+ PTV      Full                                                        +---------+---------------+---------+-----------+----------+--------------+ PERO     Full                                                        +---------+---------------+---------+-----------+----------+--------------+   +---------+---------------+---------+-----------+----------+--------------+  LEFT     CompressibilityPhasicitySpontaneityPropertiesThrombus Aging +---------+---------------+---------+-----------+----------+--------------+ CFV      Full           Yes      Yes                                 +---------+---------------+---------+-----------+----------+--------------+ SFJ      Full                                                        +---------+---------------+---------+-----------+----------+--------------+ FV Prox  Full                                                        +---------+---------------+---------+-----------+----------+--------------+ FV Mid   Full                                                        +---------+---------------+---------+-----------+----------+--------------+ FV DistalFull                                                         +---------+---------------+---------+-----------+----------+--------------+ PFV      Full                                                        +---------+---------------+---------+-----------+----------+--------------+ POP      Full           Yes      Yes                                 +---------+---------------+---------+-----------+----------+--------------+ PTV      Full                                                        +---------+---------------+---------+-----------+----------+--------------+ PERO     Full                                                        +---------+---------------+---------+-----------+----------+--------------+     Summary: RIGHT: - There is no evidence of deep vein thrombosis in the lower extremity.  - No cystic structure found in the popliteal fossa.  LEFT: - There is no evidence of deep vein thrombosis in the lower extremity.  - No  cystic structure found in the popliteal fossa.  *See table(s) above for measurements and observations.    Preliminary                LOS: 3 days   Geradine Girt  [facility]   Pager on [email or web address]. If 7PM-7AM, please contact night-coverage at [email or web address]     04/24/2022, 10:57 AM

## 2022-04-24 NOTE — Progress Notes (Signed)
Bilateral lower extremity venous duplex has been completed. Preliminary results can be found in CV Proc through chart review.   04/24/22 10:33 AM [name] RVT

## 2022-04-24 NOTE — Progress Notes (Signed)
Pharmacy Antibiotic Note  [name] is a 76 y.o. female admitted on 04/21/2022 with UTI.   Blood cultures obtained on admission, initially +2/3 bottles for MRSE - now is +3/3 bottles. Pharmacy consulted for vancomycin dosing.   Today, 04/24/22 Blood cultures also with staph hominis in both sets, staph capitis in one bottle SCr stable ~ 1.6 Tmax 103.1 F on 7/15, but has remained afebrile since Awaiting results of TTE Repeat blood cultures drawn  Alert regarding blood cultures 7/17 pm prompted pharmacist review of chart. Discovered patient currently with no antibiotic orders.  Plan: Ceftriaxone discontinued by ID Per ID, continue vancomycin  Vancomycin 1500 mg IV q48h to start NOW (last dose of vanc 7/15 ~ 2000) Check levels at steady state as needed Monitor renal function, culture data, and clinical progress for dose adjustments or de-escalation as indicated  Height: '5\' 3"'$  (160 cm) Weight: 78 kg (171 lb 15.3 oz) IBW/kg (Calculated) : 52.4  Temp (24hrs), Avg:97.7 F (36.5 C), Min:97.6 F (36.4 C), Max:97.7 F (36.5 C)  Recent Labs  Lab 04/21/22 1924 04/21/22 1925 04/21/22 2132 04/21/22 2139 04/21/22 2220 04/22/22 0139 04/22/22 0500 04/23/22 0346 04/24/22 0407  WBC 17.3*  --   --  16.0*  --   --  16.3*  --  10.0  CREATININE 1.39*  --   --  1.26*  --   --  1.29* 1.67* 1.62*  LATICACIDVEN  --  2.6* 1.4  --  1.0 1.3  --   --   --      Estimated Creatinine Clearance: 30.6 mL/min (A) (by C-G formula based on SCr of 1.62 mg/dL (H)).    Allergies  Allergen Reactions   Erythromycin Diarrhea and Nausea And Vomiting   Lotensin [Benazepril Hcl] Cough    Antimicrobials this admission: Ceftriaxone 7/14 >> 7/17 Vancomycin 7/15 >>   Dose adjustments this admission: 7/16: Vanc 750 q24h (no doses given) >> 1 g q36h  Microbiology results: 7/17 BCx: pending 7/14 BCx: 3/3 MRSE, staph hominis 2/2 sets, staph capitis 1/2 sets 7/14 UCx: multiple species, suggest  recollection  Tawnya Crook, PharmD, BCPS Clinical Pharmacist 04/24/2022 9:23 PM

## 2022-04-24 NOTE — Plan of Care (Signed)

## 2022-04-24 NOTE — Care Management Important Message (Signed)
Important Message  Patient Details IM Letter given to the Patient. Name: [name] MRN: [id number] Date of Birth: Apr 14, 1946   Medicare Important Message Given:  Yes     Kerin Salen 04/24/2022, 12:58 PM

## 2022-04-25 DIAGNOSIS — I5020 Unspecified systolic (congestive) heart failure: Secondary | ICD-10-CM | POA: Diagnosis not present

## 2022-04-25 DIAGNOSIS — N12 Tubulo-interstitial nephritis, not specified as acute or chronic: Secondary | ICD-10-CM | POA: Diagnosis not present

## 2022-04-25 DIAGNOSIS — J9601 Acute respiratory failure with hypoxia: Secondary | ICD-10-CM | POA: Diagnosis not present

## 2022-04-25 DIAGNOSIS — A419 Sepsis, unspecified organism: Secondary | ICD-10-CM | POA: Diagnosis not present

## 2022-04-25 DIAGNOSIS — R6520 Severe sepsis without septic shock: Secondary | ICD-10-CM | POA: Diagnosis not present

## 2022-04-25 LAB — CBC
HCT: 27.1 % — ABNORMAL LOW (ref 36.0–46.0)
Hemoglobin: 8.3 g/dL — ABNORMAL LOW (ref 12.0–15.0)
MCH: 29.1 pg (ref 26.0–34.0)
MCHC: 30.6 g/dL (ref 30.0–36.0)
MCV: 95.1 fL (ref 80.0–100.0)
Platelets: 206 10*3/uL (ref 150–400)
RBC: 2.85 MIL/uL — ABNORMAL LOW (ref 3.87–5.11)
RDW: 17.4 % — ABNORMAL HIGH (ref 11.5–15.5)
WBC: 8.8 10*3/uL (ref 4.0–10.5)
nRBC: 0 % (ref 0.0–0.2)

## 2022-04-25 LAB — BASIC METABOLIC PANEL
Anion gap: 7 (ref 5–15)
BUN: 20 mg/dL (ref 8–23)
CO2: 27 mmol/L (ref 22–32)
Calcium: 7.9 mg/dL — ABNORMAL LOW (ref 8.9–10.3)
Chloride: 105 mmol/L (ref 98–111)
Creatinine, Ser: 1.38 mg/dL — ABNORMAL HIGH (ref 0.44–1.00)
GFR, Estimated: 40 mL/min — ABNORMAL LOW (ref >60)
Glucose, Bld: 99 mg/dL (ref 70–99)
Potassium: 3.5 mmol/L (ref 3.5–5.1)
Sodium: 139 mmol/L (ref 135–145)

## 2022-04-25 LAB — GLUCOSE, CAPILLARY: Glucose-Capillary: 93 mg/dL (ref 70–99)

## 2022-04-25 NOTE — TOC Initial Note (Signed)
Transition of Care [facility]) - Initial/Assessment Note    Patient Details  Name: [name] MRN: [id number] Date of Birth: 1946-08-03  Transition of Care [facility]) CM/SW Contact:    Joaquin Courts, RN Phone Number: 04/25/2022, 12:12 PM  Clinical Narrative:                 TOC reviewed chart, noted patient is from home, has pcp and remains on room air.  TOC outreached to provider with request to consider PT/OT eval for discharge planning.  TOC will continue to follow for possible needs.  Expected Discharge Plan: Home/Self Care Barriers to Discharge: Continued Medical Work up   Patient Goals and CMS Choice Patient states their goals for this hospitalization and ongoing recovery are:: to get better      Expected Discharge Plan and Services Expected Discharge Plan: Home/Self Care       Living arrangements for the past 2 months: Single Family Home                                      Prior Living Arrangements/Services Living arrangements for the past 2 months: Single Family Home   Patient language and need for interpreter reviewed:: Yes Do you feel safe going back to the place where you live?: Yes      Need for Family Participation in Patient Care: Yes (Comment) Care giver support system in place?: Yes (comment)   Criminal Activity/Legal Involvement Pertinent to Current Situation/Hospitalization: No - Comment as needed  Activities of Daily Living Home Assistive Devices/Equipment: Cane (specify quad or straight) ADL Screening (condition at time of admission) Patient's cognitive ability adequate to safely complete daily activities?: Yes Is the patient deaf or have difficulty hearing?: No Does the patient have difficulty seeing, even when wearing glasses/contacts?: No Does the patient have difficulty concentrating, remembering, or making decisions?: No Patient able to express need for assistance with ADLs?: Yes Does the patient have difficulty dressing or  bathing?: No Independently performs ADLs?: No Communication: Independent Dressing (OT): Independent Grooming: Independent Feeding: Independent Bathing: Independent Toileting: Needs assistance Is this a change from baseline?: Change from baseline, expected to last >3days In/Out Bed: Needs assistance Is this a change from baseline?: Change from baseline, expected to last >3 days Walks in Home: Needs assistance Is this a change from baseline?: Change from baseline, expected to last >3 days Does the patient have difficulty walking or climbing stairs?: Yes Weakness of Legs: Both Weakness of Arms/Hands: Both  Permission Sought/Granted                  Emotional Assessment Appearance:: Appears stated age     Orientation: : Oriented to Self, Oriented to Place, Oriented to  Time, Oriented to Situation   Psych Involvement: No (comment)  Admission diagnosis:  Pyelonephritis [N12] Sepsis without acute organ dysfunction, due to unspecified organism [facility]) [A41.9] Patient Active Problem List   Diagnosis Date Noted   Acute pulmonary embolism ([place]) 04/22/2022   Urothelial carcinoma ([place]) 04/22/2022   Severe sepsis ([place]) 04/22/2022   Pyelonephritis 04/21/2022   CAD in native artery 01/11/2021   HFrEF (heart failure with reduced ejection fraction) ([place]) 01/11/2021   Chronic combined systolic and diastolic CHF (congestive heart failure) ([place]) 08/24/2020   Acute respiratory failure with hypoxia ([place])    Essential hypertension    Mixed hyperlipidemia    Depression    Hypokalemia  Hypomagnesemia    PCP:  [name], MD Pharmacy:   [facility] [place] [address] Phone: [phone] Fax: [place], [place] - [address] [place] [address] Phone: [phone] Fax: [phone]  [place] [address] Phone: [phone] Fax: [phone]     Social Determinants of Health (SDOH) Interventions    Readmission Risk Interventions    04/25/2022   12:11 PM  Readmission Risk Prevention Plan  Transportation Screening Complete  HRI or [place] Complete  Social Work Consult for [place] Planning/Counseling Complete  Palliative Care Screening Not Applicable  Medication Review [employer]) Complete

## 2022-04-25 NOTE — Plan of Care (Signed)
  Problem: Clinical Measurements: Goal: Will remain free from infection Outcome: Progressing   Problem: Clinical Measurements: Goal: Diagnostic test results will improve Outcome: Progressing   Problem: Activity: Goal: Risk for activity intolerance will decrease Outcome: Progressing   Problem: Nutrition: Goal: Adequate nutrition will be maintained Outcome: Progressing   Problem: Elimination: Goal: Will not experience complications related to bowel motility Outcome: Progressing   Problem: Pain Managment: Goal: General experience of comfort will improve Outcome: Progressing

## 2022-04-25 NOTE — Progress Notes (Signed)
Progress Note    CHRYSTLE MURILLO  YKD:[id number] DOB: 18-Jan-1946  DOA: 04/21/2022 PCP: [name], MD      Brief Narrative:    Medical records reviewed and are as summarized below:  [name] is a 76 y.o. female with medical history significant of moderate nonobstructive CAD (70% Diag disease 11/05/20), HFmrEF, HTN ,CKD IV, depression , GERD, urothelial carcinoma s/p ureterectomy further complicated by recurrence on left for which she is followed by urology and oncology.  Left ureteral stent was removed on 03/09/2022 and left ureteral stent was placed on 03/23/2022.  She said she has had 2 rounds of antibiotics for UTIs since that time. Further interim history notes last chemo therapy 2 weeks ago.  She said chemotherapy has been discontinued because of intolerance to treatment.  She presented to the hospital because of fever, nausea, vomiting, cough, left flank pain, generalized weakness and a fall at home.  Found to have ? Bacteremia and a ? PE.        Assessment/Plan:   Principal Problem:   Severe sepsis (HCC) Active Problems:   Acute respiratory failure with hypoxia (HCC)   HFrEF (heart failure with reduced ejection fraction) (HCC)   Pyelonephritis   Acute pulmonary embolism (HCC)   Urothelial carcinoma (HCC)   Fever, severe sepsis secondary to acute left pyelonephritis, recent left ureteral stent placement on 03/23/2022, recent use of antibiotics for complicated UTI:   -IV vanc - Dr. Mal Misty notes a consult from Dr. Tresa Moore, urologist but no formal consult placed -Of note, BCID was initially reported as Staph epidermidis.  Patient was empirically started on IV vancomycin.  ID was consulted.  However, blood culture eventually showed Staph hominis and Staph capitis -- defer to ID for abx  Hypotension:  -d/c hydralazine -holding parameters on coreg  Small Acute right-sided pulmonary embolism:  -CT scan unimpressive but she was started on Eliquis by prior  MD -LE duplex negative as well -would treat for lower end of time frame  Acute hypoxic respiratory failure:  -wean to RA  Urothelial carcinoma: Most recent chemotherapy was about 2 weeks prior to admission.  Patient said treatment has been discontinued because of intolerance to treatment.  Outpatient follow-up with urologist and oncologist.  CKD stage IV: Creatinine is stable -baseline CR around 1.3  Chronic diastolic CHF: Compensated.  2D echo in May 2022 showed EF estimated at 70 to 39%, grade 1 diastolic dysfunction.   Obesity Estimated body mass index is 31.24 kg/m as calculated from the following:   Height as of this encounter: '5\' 3"'$  (1.6 m).   Weight as of this encounter: 80 kg.      Ambulate patient PT eval   Consultants: ID following Does not appear urology was actually consulted      Medications:    apixaban  10 mg Oral BID   Followed by   Derrill Memo ON 04/29/2022] apixaban  5 mg Oral BID   aspirin EC  81 mg Oral Daily   calcium carbonate  1 tablet Oral q morning   carvedilol  3.125 mg Oral BID WC   cholecalciferol  1,000 Units Oral Daily   docusate sodium  100 mg Oral BID   ezetimibe  10 mg Oral Q lunch   feeding supplement  237 mL Oral TID BM   isosorbide mononitrate  15 mg Oral Daily   multivitamin with minerals  1 tablet Oral Daily   rosuvastatin  40 mg Oral Daily  sertraline  50 mg Oral Daily   Continuous Infusions:  vancomycin Stopped (04/25/22 0025)     Anti-infectives (From admission, onward)    Start     Dose/Rate Route Frequency Ordered Stop   04/24/22 2215  vancomycin (VANCOREADY) IVPB 1500 mg/300 mL        1,500 mg 150 mL/hr over 120 Minutes Intravenous Every 48 hours 04/24/22 2125     04/24/22 0800  vancomycin (VANCOCIN) IVPB 1000 mg/200 mL premix  Status:  Discontinued        1,000 mg 200 mL/hr over 60 Minutes Intravenous Every 36 hours 04/23/22 1015 04/23/22 1300   04/23/22 2000  vancomycin (VANCOREADY) IVPB 750 mg/150 mL  Status:   Discontinued        750 mg 150 mL/hr over 60 Minutes Intravenous Every 24 hours 04/22/22 1932 04/23/22 1003   04/22/22 1945  vancomycin (VANCOREADY) IVPB 1500 mg/300 mL        1,500 mg 150 mL/hr over 120 Minutes Intravenous NOW 04/22/22 1859 04/22/22 2200   04/21/22 1900  cefTRIAXone (ROCEPHIN) 2 g in sodium chloride 0.9 % 100 mL IVPB  Status:  Discontinued        2 g 200 mL/hr over 30 Minutes Intravenous Every 24 hours 04/21/22 1848 04/24/22 1458              Family Communication/Anticipated D/C date and plan/Code Status   DVT prophylaxis:  apixaban (ELIQUIS) tablet 10 mg  apixaban (ELIQUIS) tablet 5 mg     Code Status: Full Code  Family Communication: None Disposition Plan: Pending improvement and ID recs   Status is: Inpatient Remains inpatient appropriate because: On IV antibiotics       Subjective:   No SOB, off O2  Objective:    Vitals:   04/24/22 1417 04/24/22 2011 04/25/22 0430 04/25/22 0500  BP: (!) 110/49 (!) 123/58 134/67   Pulse: 63 65 65   Resp: '18 20 20   '$ Temp: 97.7 F (36.5 C) 97.6 F (36.4 C) 97.8 F (36.6 C)   TempSrc: Oral Oral Oral   SpO2: 93% 97% 95%   Weight:    80 kg  Height:       No data found.   Intake/Output Summary (Last 24 hours) at 04/25/2022 1228 Last data filed at 04/25/2022 1030 Gross per 24 hour  Intake 1381.74 ml  Output 500 ml  Net 881.74 ml   Filed Weights   04/21/22 1933 04/25/22 0500  Weight: 78 kg 80 kg    Exam:    General: Appearance:    Obese female in no acute distress     Lungs:     respirations unlabored  Heart:    Normal heart rate.   MS:   All extremities are intact.   Neurologic:   Awake, alert        Data Reviewed:   I have personally reviewed following labs and imaging studies:  Labs: Labs show the following:   Basic Metabolic Panel: Recent Labs  Lab 04/21/22 1924 04/21/22 2139 04/22/22 0500 04/23/22 0346 04/24/22 0407 04/25/22 0437  NA 139  --  137  --  139 139   K 3.6  --  3.7  --  3.6 3.5  CL 100  --  101  --  104 105  CO2 30  --  27  --  27 27  GLUCOSE 133*  --  113*  --  100* 99  BUN 12  --  14  --  21  20  CREATININE 1.39* 1.26* 1.29* 1.67* 1.62* 1.38*  CALCIUM 8.7*  --  7.8*  --  7.6* 7.9*   GFR Estimated Creatinine Clearance: 36.3 mL/min (A) (by C-G formula based on SCr of 1.38 mg/dL (H)). Liver Function Tests: Recent Labs  Lab 04/21/22 1924 04/22/22 0500  AST 19 51*  ALT 13 18  ALKPHOS 50 44  BILITOT 0.5 0.6  PROT 7.2 5.9*  ALBUMIN 3.0* 2.5*   No results for input(s): "LIPASE", "AMYLASE" in the last 168 hours. No results for input(s): "AMMONIA" in the last 168 hours. Coagulation profile Recent Labs  Lab 04/21/22 1924  INR 1.2    CBC: Recent Labs  Lab 04/21/22 1924 04/21/22 2139 04/22/22 0500 04/24/22 0407 04/25/22 0437  WBC 17.3* 16.0* 16.3* 10.0 8.8  NEUTROABS 15.0*  --   --  7.5  --   HGB 10.2* 8.8* 8.7* 8.1* 8.3*  HCT 31.6* 27.9* 27.4* 25.5* 27.1*  MCV 93.2 93.3 93.2 93.4 95.1  PLT 261 208 188 195 206   Cardiac Enzymes: No results for input(s): "CKTOTAL", "CKMB", "CKMBINDEX", "TROPONINI" in the last 168 hours. BNP (last 3 results) Recent Labs    08/10/21 0835  PROBNP 157   CBG: Recent Labs  Lab 04/22/22 0736 04/23/22 0832 04/24/22 0855 04/25/22 0820  GLUCAP 116* 100* 101* 93   D-Dimer: No results for input(s): "DDIMER" in the last 72 hours.  Hgb A1c: No results for input(s): "HGBA1C" in the last 72 hours.  Lipid Profile: No results for input(s): "CHOL", "HDL", "LDLCALC", "TRIG", "CHOLHDL", "LDLDIRECT" in the last 72 hours. Thyroid function studies: No results for input(s): "TSH", "T4TOTAL", "T3FREE", "THYROIDAB" in the last 72 hours.  Invalid input(s): "FREET3"  Anemia work up: No results for input(s): "VITAMINB12", "FOLATE", "FERRITIN", "TIBC", "IRON", "RETICCTPCT" in the last 72 hours. Sepsis Labs: Recent Labs  Lab 04/21/22 1925 04/21/22 2132 04/21/22 2139 04/21/22 2220  04/22/22 0139 04/22/22 0500 04/24/22 0407 04/25/22 0437  PROCALCITON  --   --  0.25  --   --   --   --   --   WBC  --   --  16.0*  --   --  16.3* 10.0 8.8  LATICACIDVEN 2.6* 1.4  --  1.0 1.3  --   --   --     Microbiology Recent Results (from the past 240 hour(s))  Blood Culture (routine x 2)     Status: Abnormal (Preliminary result)   Collection Time: 04/21/22  7:05 PM   Specimen: BLOOD LEFT FOREARM  Result Value Ref Range Status   Specimen Description   Final    BLOOD LEFT FOREARM Performed at [facility], [place] [address] [place], [place] [address]    Special Requests   Final    BOTTLES DRAWN AEROBIC AND ANAEROBIC Blood Culture adequate volume Performed at [place] [address] [place], [place] [address]    Culture  Setup Time   Final    GRAM POSITIVE COCCI IN CLUSTERS IN BOTH AEROBIC AND ANAEROBIC BOTTLES CRITICAL RESULT CALLED TO, READ BACK BY AND VERIFIED WITH: PHARMD M.STEWYNE AT 1850 ON 04/22/2022 BY T.SAAD.    Culture (A)  Final    STAPHYLOCOCCUS CAPITIS THE SIGNIFICANCE OF ISOLATING THIS ORGANISM FROM A SINGLE SET OF BLOOD CULTURES WHEN MULTIPLE SETS ARE DRAWN IS UNCERTAIN. PLEASE NOTIFY THE MICROBIOLOGY DEPARTMENT WITHIN ONE WEEK IF SPECIATION AND SENSITIVITIES ARE REQUIRED. STAPHYLOCOCCUS HOMINIS STAPHYLOCOCCUS EPIDERMIDIS SUSCEPTIBILITIES TO FOLLOW Performed at [facility], [place]  [address] [place], [place] [address]    Report Status PENDING  Incomplete  Blood Culture ID Panel (Reflexed)     Status: Abnormal   Collection Time: 04/21/22  7:05 PM  Result Value Ref Range Status   Enterococcus faecalis NOT DETECTED NOT DETECTED Final   Enterococcus Faecium NOT DETECTED NOT DETECTED Final   Listeria monocytogenes NOT DETECTED NOT DETECTED Final   Staphylococcus species DETECTED (A) NOT DETECTED Final    Comment: CRITICAL RESULT CALLED TO, READ BACK BY AND VERIFIED WITH: PHARMD M.STEWYNE AT 1850 ON 04/22/2022 BY T.SAAD.     Staphylococcus aureus (BCID) NOT DETECTED NOT DETECTED Final   Staphylococcus epidermidis DETECTED (A) NOT DETECTED Final    Comment: Methicillin (oxacillin) resistant coagulase negative staphylococcus. Possible blood culture contaminant (unless isolated from more than one blood culture draw or clinical case suggests pathogenicity). No antibiotic treatment is indicated for blood  culture contaminants. CRITICAL RESULT CALLED TO, READ BACK BY AND VERIFIED WITH: PHARMD M.STEWYNE AT 1850 ON 04/22/2022 BY T.SAAD.    Staphylococcus lugdunensis NOT DETECTED NOT DETECTED Final   Streptococcus species NOT DETECTED NOT DETECTED Final   Streptococcus agalactiae NOT DETECTED NOT DETECTED Final   Streptococcus pneumoniae NOT DETECTED NOT DETECTED Final   Streptococcus pyogenes NOT DETECTED NOT DETECTED Final   A.calcoaceticus-baumannii NOT DETECTED NOT DETECTED Final   Bacteroides fragilis NOT DETECTED NOT DETECTED Final   Enterobacterales NOT DETECTED NOT DETECTED Final   Enterobacter cloacae complex NOT DETECTED NOT DETECTED Final   Escherichia coli NOT DETECTED NOT DETECTED Final   Klebsiella aerogenes NOT DETECTED NOT DETECTED Final   Klebsiella oxytoca NOT DETECTED NOT DETECTED Final   Klebsiella pneumoniae NOT DETECTED NOT DETECTED Final   Proteus species NOT DETECTED NOT DETECTED Final   Salmonella species NOT DETECTED NOT DETECTED Final   Serratia marcescens NOT DETECTED NOT DETECTED Final   Haemophilus influenzae NOT DETECTED NOT DETECTED Final   Neisseria meningitidis NOT DETECTED NOT DETECTED Final   Pseudomonas aeruginosa NOT DETECTED NOT DETECTED Final   Stenotrophomonas maltophilia NOT DETECTED NOT DETECTED Final   Candida albicans NOT DETECTED NOT DETECTED Final   Candida auris NOT DETECTED NOT DETECTED Final   Candida glabrata NOT DETECTED NOT DETECTED Final   Candida krusei NOT DETECTED NOT DETECTED Final   Candida parapsilosis NOT DETECTED NOT DETECTED Final   Candida tropicalis  NOT DETECTED NOT DETECTED Final   Cryptococcus neoformans/gattii NOT DETECTED NOT DETECTED Final   Methicillin resistance mecA/C DETECTED (A) NOT DETECTED Final    Comment: CRITICAL RESULT CALLED TO, READ BACK BY AND VERIFIED WITH: PHARMD M.STEWYNE AT 1850 ON 04/22/2022 BY T.SAAD. Performed at [facility], [place] [address], [place], [place] [address]   Blood Culture (routine x 2)     Status: Abnormal (Preliminary result)   Collection Time: 04/21/22  7:10 PM   Specimen: BLOOD RIGHT FOREARM  Result Value Ref Range Status   Specimen Description   Final    BLOOD RIGHT FOREARM Performed at [facility], [place] [address], [place], [place] [address]    Special Requests   Final    BOTTLES DRAWN AEROBIC ONLY Blood Culture adequate volume Performed at [place] [address] [place], [place] [address]    Culture  Setup Time   Final    GRAM POSITIVE COCCI IN CLUSTERS AEROBIC BOTTLE ONLY    Culture (A)  Final    STAPHYLOCOCCUS EPIDERMIDIS STAPHYLOCOCCUS HOMINIS SUSCEPTIBILITIES TO FOLLOW Performed at [facility],  [address] [place], [place] [address]    Report Status PENDING  Incomplete  Resp Panel by RT-PCR (Flu A&B, Covid) Anterior Nasal Swab     Status: None   Collection Time: 04/21/22  7:37 PM   Specimen: Anterior Nasal Swab  Result Value Ref Range Status   SARS Coronavirus 2 by RT PCR NEGATIVE NEGATIVE Final    Comment: (NOTE) SARS-CoV-2 target nucleic acids are NOT DETECTED.  The SARS-CoV-2 RNA is generally detectable in upper respiratory specimens during the acute phase of infection. The lowest concentration of SARS-CoV-2 viral copies this assay can detect is 138 copies/mL. A negative result does not preclude SARS-Cov-2 infection and should not be used as the sole basis for treatment or other patient management decisions. A negative result may occur with  improper specimen collection/handling, submission of specimen other than  nasopharyngeal swab, presence of viral mutation(s) within the areas targeted by this assay, and inadequate number of viral copies(<138 copies/mL). A negative result must be combined with clinical observations, patient history, and epidemiological information. The expected result is Negative.  Fact Sheet for Patients:  EntrepreneurPulse.com.au  Fact Sheet for Healthcare Providers:  IncredibleEmployment.be  This test is no t yet approved or cleared by the [place] FDA and  has been authorized for detection and/or diagnosis of SARS-CoV-2 by FDA under an Emergency Use Authorization (EUA). This EUA will remain  in effect (meaning this test can be used) for the duration of the COVID-19 declaration under Section 564(b)(1) of the Act, 21 U.S.C.section 360bbb-3(b)(1), unless the authorization is terminated  or revoked sooner.       Influenza A by PCR NEGATIVE NEGATIVE Final   Influenza B by PCR NEGATIVE NEGATIVE Final    Comment: (NOTE) The Xpert Xpress SARS-CoV-2/FLU/RSV plus assay is intended as an aid in the diagnosis of influenza from Nasopharyngeal swab specimens and should not be used as a sole basis for treatment. Nasal washings and aspirates are unacceptable for Xpert Xpress SARS-CoV-2/FLU/RSV testing.  Fact Sheet for Patients: EntrepreneurPulse.com.au  Fact Sheet for Healthcare Providers: IncredibleEmployment.be  This test is not yet approved or cleared by the [place] FDA and has been authorized for detection and/or diagnosis of SARS-CoV-2 by FDA under an Emergency Use Authorization (EUA). This EUA will remain in effect (meaning this test can be used) for the duration of the COVID-19 declaration under Section 564(b)(1) of the Act, 21 U.S.C. section 360bbb-3(b)(1), unless the authorization is terminated or revoked.  Performed at [facility], [place] [address], [place], [place] [address]   Urine Culture     Status: Abnormal   Collection Time: 04/22/22 12:31 AM   Specimen: In/Out Cath Urine  Result Value Ref Range Status   Specimen Description   Final    IN/OUT CATH URINE Performed at [facility] [address] [place], [place] [address]    Special Requests   Final    NONE Performed at [facility], [place] [address], [place], [place] [address]    Culture MULTIPLE SPECIES PRESENT, SUGGEST RECOLLECTION (A)  Final   Report Status 04/23/2022 FINAL  Final  Urine Culture     Status: Abnormal   Collection Time: 04/23/22  2:29 PM   Specimen: Urine, Clean Catch  Result Value Ref Range Status   Specimen Description   Final    URINE, CLEAN CATCH Performed at [facility], [place] [address] [place], [place] [address]    Special Requests   Final  NONE Performed at [facility], [place] [address], [place], [place] [address]    Culture (A)  Final    <10,000 COLONIES/mL INSIGNIFICANT GROWTH Performed at [place] [address], [place], [place] [address]    Report Status 04/24/2022 FINAL  Final    Procedures and diagnostic studies:  VAS [place] LOWER EXTREMITY VENOUS (DVT)  Result Date: 04/24/2022  Lower Venous DVT Study Patient Name:  VUNG KUSH  Date of Exam:   04/24/2022 Medical Rec #: [id number]          Accession #:    [id number] Date of Birth: [birth year]          Patient Gender: F Patient Age:   65 years Exam Location:  [facility] Procedure:      VAS [place] LOWER EXTREMITY VENOUS (DVT) Referring Phys: Eulogio Bear --------------------------------------------------------------------------------  Indications: Pulmonary embolism.  Risk Factors: Confirmed PE. Comparison Study: No prior studies. Performing Technologist: Oliver Hum RVT  Examination Guidelines: A complete evaluation includes B-mode imaging, spectral Doppler, color Doppler, and power Doppler  as needed of all accessible portions of each vessel. Bilateral testing is considered an integral part of a complete examination. Limited examinations for reoccurring indications may be performed as noted. The reflux portion of the exam is performed with the patient in reverse Trendelenburg.  +---------+---------------+---------+-----------+----------+--------------+ RIGHT    CompressibilityPhasicitySpontaneityPropertiesThrombus Aging +---------+---------------+---------+-----------+----------+--------------+ CFV      Full           Yes      Yes                                 +---------+---------------+---------+-----------+----------+--------------+ SFJ      Full                                                        +---------+---------------+---------+-----------+----------+--------------+ FV Prox  Full                                                        +---------+---------------+---------+-----------+----------+--------------+ FV Mid   Full                                                        +---------+---------------+---------+-----------+----------+--------------+ FV DistalFull                                                        +---------+---------------+---------+-----------+----------+--------------+ PFV      Full                                                        +---------+---------------+---------+-----------+----------+--------------+  POP      Full           Yes      Yes                                 +---------+---------------+---------+-----------+----------+--------------+ PTV      Full                                                        +---------+---------------+---------+-----------+----------+--------------+ PERO     Full                                                        +---------+---------------+---------+-----------+----------+--------------+    +---------+---------------+---------+-----------+----------+--------------+ LEFT     CompressibilityPhasicitySpontaneityPropertiesThrombus Aging +---------+---------------+---------+-----------+----------+--------------+ CFV      Full           Yes      Yes                                 +---------+---------------+---------+-----------+----------+--------------+ SFJ      Full                                                        +---------+---------------+---------+-----------+----------+--------------+ FV Prox  Full                                                        +---------+---------------+---------+-----------+----------+--------------+ FV Mid   Full                                                        +---------+---------------+---------+-----------+----------+--------------+ FV DistalFull                                                        +---------+---------------+---------+-----------+----------+--------------+ PFV      Full                                                        +---------+---------------+---------+-----------+----------+--------------+ POP      Full           Yes      Yes                                 +---------+---------------+---------+-----------+----------+--------------+  PTV      Full                                                        +---------+---------------+---------+-----------+----------+--------------+ PERO     Full                                                        +---------+---------------+---------+-----------+----------+--------------+     Summary: RIGHT: - There is no evidence of deep vein thrombosis in the lower extremity.  - No cystic structure found in the popliteal fossa.  LEFT: - There is no evidence of deep vein thrombosis in the lower extremity.  - No cystic structure found in the popliteal fossa.  *See table(s) above for measurements and observations. Electronically signed  by Harold Barban MD on 04/24/2022 at 1:33:51 PM.    Final                LOS: 4 days   [facility]   Pager on [email or web address]. If 7PM-7AM, please contact night-coverage at [email or web address]     04/25/2022, 12:28 PM

## 2022-04-25 NOTE — Evaluation (Signed)
Physical Therapy Evaluation Patient Details Name: [name] MRN: [id number] DOB: 10-09-1946 Today's Date: 04/25/2022  History of Present Illness  76 yo female admitted with sepsis. Hx of CAD  Clinical Impression  On eval, pt required Min guard assist for mobility. She walked ~150 feet with a RW. Pt presents with general weakness, decreased activity tolerance, and impaired gait and balance. Discussed d/c plan-pt will return home. She is agreeable to using a RW for ambulation safety. Will recommend HHPT f/u if pt is agreeable. Will plan to follow pt during this hospital stay.        Recommendations for follow up therapy are one component of a multi-disciplinary discharge planning process, led by the attending physician.  Recommendations may be updated based on patient status, additional functional criteria and insurance authorization.  Follow Up Recommendations Home health PT      Assistance Recommended at Discharge PRN  Patient can return home with the following  A little help with walking and/or transfers;A little help with bathing/dressing/bathroom;Help with stairs or ramp for entrance;Assistance with cooking/housework;Assist for transportation    Equipment Recommendations Rolling walker (2 wheels)  Recommendations for Other Services       Functional Status Assessment Patient has had a recent decline in their functional status and demonstrates the ability to make significant improvements in function in a reasonable and predictable amount of time.     Precautions / Restrictions Precautions Precautions: Fall Restrictions Weight Bearing Restrictions: No      Mobility  Bed Mobility Overal bed mobility: Modified Independent                  Transfers Overall transfer level: Needs assistance Equipment used: Rolling walker (2 wheels), None Transfers: Sit to/from Stand Sit to Stand: Supervision           General transfer comment: x 2. Cues for safety, hand  placement.    Ambulation/Gait Ambulation/Gait assistance: Min guard, Min assist Gait Distance (Feet): 150 Feet (150'x1; 50'x1) Assistive device: Rolling walker (2 wheels), 1 person hand held assist Gait Pattern/deviations: Step-through pattern, Decreased stride length       General Gait Details: Walked x 1 with 1 HHA-very unsteady so returned to room to get RW. Walked x 1 with RW-Min guard assist-improved stability. Pt tolerated distance well although she did report some fatigue.  Stairs            Wheelchair Mobility    Modified Rankin (Stroke Patients Only)       Balance Overall balance assessment: Needs assistance         Standing balance support: Reliant on assistive device for balance, During functional activity, Bilateral upper extremity supported Standing balance-Leahy Scale: Fair                               Pertinent Vitals/Pain Pain Assessment Pain Assessment: No/denies pain    Home Living Family/patient expects to be discharged to:: Private residence Living Arrangements: Spouse/significant other Available Help at Discharge: Family Type of Home: House Home Access: Stairs to enter Entrance Stairs-Rails: Right Entrance Stairs-Number of Steps: 5   Home Layout: One level Home Equipment: None;Cane - single point      Prior Function Prior Level of Function : Independent/Modified Independent             Mobility Comments: independent ADLs Comments: still driving     Hand Dominance        Extremity/Trunk Assessment  Upper Extremity Assessment Upper Extremity Assessment: Overall WFL for tasks assessed    Lower Extremity Assessment Lower Extremity Assessment: Generalized weakness    Cervical / Trunk Assessment Cervical / Trunk Assessment: Normal  Communication   Communication: No difficulties  Cognition Arousal/Alertness: Awake/alert Behavior During Therapy: WFL for tasks assessed/performed Overall Cognitive Status:  Within Functional Limits for tasks assessed                                          General Comments      Exercises     Assessment/Plan    PT Assessment Patient needs continued PT services  PT Problem List Decreased strength;Decreased balance;Decreased activity tolerance;Decreased mobility;Decreased knowledge of use of DME       PT Treatment Interventions DME instruction;Gait training;Functional mobility training;Therapeutic activities;Balance training;Patient/family education;Therapeutic exercise    PT Goals (Current goals can be found in the Care Plan section)  Acute Rehab PT Goals Patient Stated Goal: regain PLOF/independence PT Goal Formulation: With patient Time For Goal Achievement: 05/09/22 Potential to Achieve Goals: Good    Frequency Min 3X/week     Co-evaluation               AM-PAC PT "6 Clicks" Mobility  Outcome Measure Help needed turning from your back to your side while in a flat bed without using bedrails?: None Help needed moving from lying on your back to sitting on the side of a flat bed without using bedrails?: None Help needed moving to and from a bed to a chair (including a wheelchair)?: A Little Help needed standing up from a chair using your arms (e.g., wheelchair or bedside chair)?: A Little Help needed to walk in hospital room?: A Little Help needed climbing 3-5 steps with a railing? : A Little 6 Click Score: 20    End of Session Equipment Utilized During Treatment: Gait belt Activity Tolerance: Patient tolerated treatment well Patient left: in bed;with call bell/phone within reach;with bed alarm set   PT Visit Diagnosis: Muscle weakness (generalized) (M62.81);Difficulty in walking, not elsewhere classified (R26.2)    Time: 3009-2330 PT Time Calculation (min) (ACUTE ONLY): 13 min   Charges:   PT Evaluation $PT Eval Moderate Complexity: [place], PT Acute Rehabilitation  Office:  [phone] Pager: [phone]

## 2022-04-25 NOTE — Progress Notes (Signed)
Subjective: No new complaints   Antibiotics:  Anti-infectives (From admission, onward)    Start     Dose/Rate Route Frequency Ordered Stop   04/24/22 2215  vancomycin (VANCOREADY) IVPB 1500 mg/300 mL        1,500 mg 150 mL/hr over 120 Minutes Intravenous Every 48 hours 04/24/22 2125     04/24/22 0800  vancomycin (VANCOCIN) IVPB 1000 mg/200 mL premix  Status:  Discontinued        1,000 mg 200 mL/hr over 60 Minutes Intravenous Every 36 hours 04/23/22 1015 04/23/22 1300   04/23/22 2000  vancomycin (VANCOREADY) IVPB 750 mg/150 mL  Status:  Discontinued        750 mg 150 mL/hr over 60 Minutes Intravenous Every 24 hours 04/22/22 1932 04/23/22 1003   04/22/22 1945  vancomycin (VANCOREADY) IVPB 1500 mg/300 mL        1,500 mg 150 mL/hr over 120 Minutes Intravenous NOW 04/22/22 1859 04/22/22 2200   04/21/22 1900  cefTRIAXone (ROCEPHIN) 2 g in sodium chloride 0.9 % 100 mL IVPB  Status:  Discontinued        2 g 200 mL/hr over 30 Minutes Intravenous Every 24 hours 04/21/22 1848 04/24/22 1458       Medications: Scheduled Meds:  apixaban  10 mg Oral BID   Followed by   Derrill Memo ON 04/29/2022] apixaban  5 mg Oral BID   aspirin EC  81 mg Oral Daily   calcium carbonate  1 tablet Oral q morning   carvedilol  3.125 mg Oral BID WC   cholecalciferol  1,000 Units Oral Daily   docusate sodium  100 mg Oral BID   ezetimibe  10 mg Oral Q lunch   feeding supplement  237 mL Oral TID BM   isosorbide mononitrate  15 mg Oral Daily   multivitamin with minerals  1 tablet Oral Daily   rosuvastatin  40 mg Oral Daily   sertraline  50 mg Oral Daily   Continuous Infusions:  vancomycin Stopped (04/25/22 0025)   PRN Meds:.acetaminophen, ondansetron **OR** ondansetron (ZOFRAN) IV, oxybutynin, oxyCODONE, traZODone    Objective: Weight change:   Intake/Output Summary (Last 24 hours) at 04/25/2022 1516 Last data filed at 04/25/2022 1030 Gross per 24 hour  Intake 1381.74 ml  Output 100 ml  Net  1281.74 ml   Blood pressure (!) 112/52, pulse (!) 59, temperature 97.6 F (36.4 C), temperature source Oral, resp. rate 17, height '5\' 3"'$  (1.6 m), weight 80 kg, SpO2 92 %. Temp:  [97.6 F (36.4 C)-97.8 F (36.6 C)] 97.6 F (36.4 C) (07/18 1257) Pulse Rate:  [59-65] 59 (07/18 1257) Resp:  [17-20] 17 (07/18 1444) BP: (112-134)/(52-67) 112/52 (07/18 1257) SpO2:  [92 %-97 %] 92 % (07/18 1257) Weight:  [80 kg] 80 kg (07/18 0500)  Physical Exam: Physical Exam Constitutional:      General: She is not in acute distress.    Appearance: She is well-developed. She is not diaphoretic.  HENT:     Head: Normocephalic and atraumatic.     Right Ear: External ear normal.     Left Ear: External ear normal.     Mouth/Throat:     Pharynx: No oropharyngeal exudate.  Eyes:     General: No scleral icterus.    Conjunctiva/sclera: Conjunctivae normal.     Pupils: Pupils are equal, round, and reactive to light.  Cardiovascular:     Rate and Rhythm: Normal rate and regular rhythm.  Pulmonary:  Effort: Pulmonary effort is normal. No respiratory distress.     Breath sounds: No wheezing.  Abdominal:     General: Bowel sounds are normal. There is no distension.     Palpations: Abdomen is soft.     Tenderness: There is no abdominal tenderness. There is no rebound.  Musculoskeletal:        General: No tenderness. Normal range of motion.  Lymphadenopathy:     Cervical: No cervical adenopathy.  Skin:    General: Skin is warm and dry.     Coloration: Skin is not pale.     Findings: No erythema or rash.  Neurological:     General: No focal deficit present.     Mental Status: She is alert and oriented to person, place, and time.     Motor: No abnormal muscle tone.     Coordination: Coordination normal.  Psychiatric:        Mood and Affect: Mood normal.        Behavior: Behavior normal.        Thought Content: Thought content normal.        Judgment: Judgment normal.       CBC:    BMET Recent Labs    04/24/22 0407 04/25/22 0437  NA 139 139  K 3.6 3.5  CL 104 105  CO2 27 27  GLUCOSE 100* 99  BUN 21 20  CREATININE 1.62* 1.38*  CALCIUM 7.6* 7.9*     Liver Panel  No results for input(s): "PROT", "ALBUMIN", "AST", "ALT", "ALKPHOS", "BILITOT", "BILIDIR", "IBILI" in the last 72 hours.     Sedimentation Rate No results for input(s): "ESRSEDRATE" in the last 72 hours. C-Reactive Protein No results for input(s): "CRP" in the last 72 hours.  Micro Results: Recent Results (from the past 720 hour(s))  Blood Culture (routine x 2)     Status: Abnormal (Preliminary result)   Collection Time: 04/21/22  7:05 PM   Specimen: BLOOD LEFT FOREARM  Result Value Ref Range Status   Specimen Description   Final    BLOOD LEFT FOREARM Performed at [facility], [address], [place], [place] [address]    Special Requests   Final    BOTTLES DRAWN AEROBIC AND ANAEROBIC Blood Culture adequate volume Performed at [facility] [address], [place], [place] [address]    Culture  Setup Time   Final    GRAM POSITIVE COCCI IN CLUSTERS IN BOTH AEROBIC AND ANAEROBIC BOTTLES CRITICAL RESULT CALLED TO, READ BACK BY AND VERIFIED WITH: PHARMD M.STEWYNE AT 1850 ON 04/22/2022 BY T.SAAD.    Culture (A)  Final    STAPHYLOCOCCUS CAPITIS THE SIGNIFICANCE OF ISOLATING THIS ORGANISM FROM A SINGLE SET OF BLOOD CULTURES WHEN MULTIPLE SETS ARE DRAWN IS UNCERTAIN. PLEASE NOTIFY THE MICROBIOLOGY DEPARTMENT WITHIN ONE WEEK IF SPECIATION AND SENSITIVITIES ARE REQUIRED. STAPHYLOCOCCUS HOMINIS STAPHYLOCOCCUS EPIDERMIDIS SUSCEPTIBILITIES TO FOLLOW Performed at [facility], [place] [address] [place],  [address]    Report Status PENDING  Incomplete  Blood Culture ID Panel (Reflexed)     Status: Abnormal   Collection Time: 04/21/22  7:05 PM  Result Value Ref Range Status   Enterococcus faecalis NOT DETECTED NOT DETECTED Final    Enterococcus Faecium NOT DETECTED NOT DETECTED Final   Listeria monocytogenes NOT DETECTED NOT DETECTED Final   Staphylococcus species DETECTED (A) NOT DETECTED Final    Comment: CRITICAL RESULT CALLED TO, READ BACK BY AND VERIFIED WITH: PHARMD M.STEWYNE AT  1850 ON 04/22/2022 BY T.SAAD.    Staphylococcus aureus (BCID) NOT DETECTED NOT DETECTED Final   Staphylococcus epidermidis DETECTED (A) NOT DETECTED Final    Comment: Methicillin (oxacillin) resistant coagulase negative staphylococcus. Possible blood culture contaminant (unless isolated from more than one blood culture draw or clinical case suggests pathogenicity). No antibiotic treatment is indicated for blood  culture contaminants. CRITICAL RESULT CALLED TO, READ BACK BY AND VERIFIED WITH: PHARMD M.STEWYNE AT 1850 ON 04/22/2022 BY T.SAAD.    Staphylococcus lugdunensis NOT DETECTED NOT DETECTED Final   Streptococcus species NOT DETECTED NOT DETECTED Final   Streptococcus agalactiae NOT DETECTED NOT DETECTED Final   Streptococcus pneumoniae NOT DETECTED NOT DETECTED Final   Streptococcus pyogenes NOT DETECTED NOT DETECTED Final   A.calcoaceticus-baumannii NOT DETECTED NOT DETECTED Final   Bacteroides fragilis NOT DETECTED NOT DETECTED Final   Enterobacterales NOT DETECTED NOT DETECTED Final   Enterobacter cloacae complex NOT DETECTED NOT DETECTED Final   Escherichia coli NOT DETECTED NOT DETECTED Final   Klebsiella aerogenes NOT DETECTED NOT DETECTED Final   Klebsiella oxytoca NOT DETECTED NOT DETECTED Final   Klebsiella pneumoniae NOT DETECTED NOT DETECTED Final   Proteus species NOT DETECTED NOT DETECTED Final   Salmonella species NOT DETECTED NOT DETECTED Final   Serratia marcescens NOT DETECTED NOT DETECTED Final   Haemophilus influenzae NOT DETECTED NOT DETECTED Final   Neisseria meningitidis NOT DETECTED NOT DETECTED Final   Pseudomonas aeruginosa NOT DETECTED NOT DETECTED Final   Stenotrophomonas maltophilia NOT DETECTED NOT  DETECTED Final   Candida albicans NOT DETECTED NOT DETECTED Final   Candida auris NOT DETECTED NOT DETECTED Final   Candida glabrata NOT DETECTED NOT DETECTED Final   Candida krusei NOT DETECTED NOT DETECTED Final   Candida parapsilosis NOT DETECTED NOT DETECTED Final   Candida tropicalis NOT DETECTED NOT DETECTED Final   Cryptococcus neoformans/gattii NOT DETECTED NOT DETECTED Final   Methicillin resistance mecA/C DETECTED (A) NOT DETECTED Final    Comment: CRITICAL RESULT CALLED TO, READ BACK BY AND VERIFIED WITH: PHARMD M.STEWYNE AT 1850 ON 04/22/2022 BY T.SAAD. Performed at [facility], [place] [address], [place], [place] [address]   Blood Culture (routine x 2)     Status: Abnormal (Preliminary result)   Collection Time: 04/21/22  7:10 PM   Specimen: BLOOD RIGHT FOREARM  Result Value Ref Range Status   Specimen Description   Final    BLOOD RIGHT FOREARM Performed at [facility], [place] [address], [place], [place] [address]    Special Requests   Final    BOTTLES DRAWN AEROBIC ONLY Blood Culture adequate volume Performed at [facility] [address] [place], [place] [address]    Culture  Setup Time   Final    GRAM POSITIVE COCCI IN CLUSTERS AEROBIC BOTTLE ONLY    Culture (A)  Final    STAPHYLOCOCCUS EPIDERMIDIS STAPHYLOCOCCUS HOMINIS SUSCEPTIBILITIES TO FOLLOW Performed at [facility], [place] [address], [place], [place] [address]    Report Status PENDING  Incomplete  Resp Panel by RT-PCR (Flu A&B, Covid) Anterior Nasal Swab     Status: None   Collection Time: 04/21/22  7:37 PM   Specimen: Anterior Nasal Swab  Result Value Ref Range Status   SARS Coronavirus 2 by RT PCR NEGATIVE NEGATIVE Final    Comment: (NOTE) SARS-CoV-2 target nucleic acids are NOT DETECTED.  The SARS-CoV-2 RNA is generally detectable in upper respiratory specimens during the acute phase of infection. The  lowest concentration of SARS-CoV-2 viral copies this assay  can detect is 138 copies/mL. A negative result does not preclude SARS-Cov-2 infection and should not be used as the sole basis for treatment or other patient management decisions. A negative result may occur with  improper specimen collection/handling, submission of specimen other than nasopharyngeal swab, presence of viral mutation(s) within the areas targeted by this assay, and inadequate number of viral copies(<138 copies/mL). A negative result must be combined with clinical observations, patient history, and epidemiological information. The expected result is Negative.  Fact Sheet for Patients:  EntrepreneurPulse.com.au  Fact Sheet for Healthcare Providers:  IncredibleEmployment.be  This test is no t yet approved or cleared by the [place] FDA and  has been authorized for detection and/or diagnosis of SARS-CoV-2 by FDA under an Emergency Use Authorization (EUA). This EUA will remain  in effect (meaning this test can be used) for the duration of the COVID-19 declaration under Section 564(b)(1) of the Act, 21 U.S.C.section 360bbb-3(b)(1), unless the authorization is terminated  or revoked sooner.       Influenza A by PCR NEGATIVE NEGATIVE Final   Influenza B by PCR NEGATIVE NEGATIVE Final    Comment: (NOTE) The Xpert Xpress SARS-CoV-2/FLU/RSV plus assay is intended as an aid in the diagnosis of influenza from Nasopharyngeal swab specimens and should not be used as a sole basis for treatment. Nasal washings and aspirates are unacceptable for Xpert Xpress SARS-CoV-2/FLU/RSV testing.  Fact Sheet for Patients: EntrepreneurPulse.com.au  Fact Sheet for Healthcare Providers: IncredibleEmployment.be  This test is not yet approved or cleared by the [place] FDA and has been authorized for detection and/or diagnosis of SARS-CoV-2 by FDA under an Emergency Use Authorization (EUA). This EUA will  remain in effect (meaning this test can be used) for the duration of the COVID-19 declaration under Section 564(b)(1) of the Act, 21 U.S.C. section 360bbb-3(b)(1), unless the authorization is terminated or revoked.  Performed at [facility], [place] [address], [place], [place] [address]   Urine Culture     Status: Abnormal   Collection Time: 04/22/22 12:31 AM   Specimen: In/Out Cath Urine  Result Value Ref Range Status   Specimen Description   Final    IN/OUT CATH URINE Performed at [facility] [address] [place], [place] [address]    Special Requests   Final    NONE Performed at [facility], [place] [address], [place], [place] [address]    Culture MULTIPLE SPECIES PRESENT, SUGGEST RECOLLECTION (A)  Final   Report Status 04/23/2022 FINAL  Final  Urine Culture     Status: Abnormal   Collection Time: 04/23/22  2:29 PM   Specimen: Urine, Clean Catch  Result Value Ref Range Status   Specimen Description   Final    URINE, CLEAN CATCH Performed at [facility], [place] [address], [place], [place] [address]    Special Requests   Final    NONE Performed at [facility], [place] [address] [place], [place] [address]    Culture (A)  Final    <10,000 COLONIES/mL INSIGNIFICANT GROWTH Performed at [facility] [address], [place], [place] [address]    Report Status 04/24/2022 FINAL  Final  Culture, blood (Routine X 2) w Reflex to ID Panel     Status: None (Preliminary result)   Collection Time: 04/24/22  3:36 PM   Specimen: BLOOD  Result Value Ref Range Status   Specimen  Description   Final    BLOOD BLOOD LEFT HAND Performed at [facility] [address] [place], [place] [address]    Special Requests   Final    BOTTLES DRAWN AEROBIC ONLY Blood Culture adequate volume Performed at [facility] [address] [place], [place] [address]     Culture   Final    NO GROWTH < 24 HOURS Performed at [facility] [address] [place], [place] [address]    Report Status PENDING  Incomplete  Culture, blood (Routine X 2) w Reflex to ID Panel     Status: None (Preliminary result)   Collection Time: 04/24/22  3:36 PM   Specimen: BLOOD  Result Value Ref Range Status   Specimen Description   Final    BLOOD RIGHT ANTECUBITAL Performed at [facility] [address], [place], [place] [address]    Special Requests   Final    BOTTLES DRAWN AEROBIC ONLY Blood Culture adequate volume Performed at [facility] [address], [place], [place] [address]    Culture   Final    NO GROWTH < 24 HOURS Performed at [facility] [address], [place], [place] [address]    Report Status PENDING  Incomplete    Studies/Results: VAS [place] LOWER EXTREMITY VENOUS (DVT)  Result Date: 04/24/2022  Lower Venous DVT Study Patient Name:  GARRETT BOWRING  Date of Exam:   04/24/2022 Medical Rec #: [id number]          Accession #:    [id number] Date of Birth: [birth year]          Patient Gender: F Patient Age:   9 years Exam Location:  [facility] Procedure:      VAS [place] LOWER EXTREMITY VENOUS (DVT) Referring Phys: Eulogio Bear --------------------------------------------------------------------------------  Indications: Pulmonary embolism.  Risk Factors: Confirmed PE. Comparison Study: No prior studies. Performing Technologist: Oliver Hum RVT  Examination Guidelines: A complete evaluation includes B-mode imaging, spectral Doppler, color Doppler, and power Doppler as needed of all accessible portions of each vessel. Bilateral testing is considered an integral part of a complete examination. Limited examinations for reoccurring indications may be performed as noted. The reflux portion of the exam is performed with the patient in reverse Trendelenburg.   +---------+---------------+---------+-----------+----------+--------------+ RIGHT    CompressibilityPhasicitySpontaneityPropertiesThrombus Aging +---------+---------------+---------+-----------+----------+--------------+ CFV      Full           Yes      Yes                                 +---------+---------------+---------+-----------+----------+--------------+ SFJ      Full                                                        +---------+---------------+---------+-----------+----------+--------------+ FV Prox  Full                                                        +---------+---------------+---------+-----------+----------+--------------+ FV Mid   Full                                                        +---------+---------------+---------+-----------+----------+--------------+  FV DistalFull                                                        +---------+---------------+---------+-----------+----------+--------------+ PFV      Full                                                        +---------+---------------+---------+-----------+----------+--------------+ POP      Full           Yes      Yes                                 +---------+---------------+---------+-----------+----------+--------------+ PTV      Full                                                        +---------+---------------+---------+-----------+----------+--------------+ PERO     Full                                                        +---------+---------------+---------+-----------+----------+--------------+   +---------+---------------+---------+-----------+----------+--------------+ LEFT     CompressibilityPhasicitySpontaneityPropertiesThrombus Aging +---------+---------------+---------+-----------+----------+--------------+ CFV      Full           Yes      Yes                                  +---------+---------------+---------+-----------+----------+--------------+ SFJ      Full                                                        +---------+---------------+---------+-----------+----------+--------------+ FV Prox  Full                                                        +---------+---------------+---------+-----------+----------+--------------+ FV Mid   Full                                                        +---------+---------------+---------+-----------+----------+--------------+ FV DistalFull                                                        +---------+---------------+---------+-----------+----------+--------------+  PFV      Full                                                        +---------+---------------+---------+-----------+----------+--------------+ POP      Full           Yes      Yes                                 +---------+---------------+---------+-----------+----------+--------------+ PTV      Full                                                        +---------+---------------+---------+-----------+----------+--------------+ PERO     Full                                                        +---------+---------------+---------+-----------+----------+--------------+     Summary: RIGHT: - There is no evidence of deep vein thrombosis in the lower extremity.  - No cystic structure found in the popliteal fossa.  LEFT: - There is no evidence of deep vein thrombosis in the lower extremity.  - No cystic structure found in the popliteal fossa.  *See table(s) above for measurements and observations. Electronically signed by Harold Barban MD on 04/24/2022 at 1:33:51 PM.    Final       Assessment/Plan:  INTERVAL HISTORY:  sensis on organisms still pending   Principal Problem:   Severe sepsis ([place]) Active Problems:   Acute respiratory failure with hypoxia (H[facility])   HFrEF (heart failure with reduced ejection  fraction) ([place])   Pyelonephritis   Acute pulmonary embolism ([place])   Urothelial carcinoma ([place])    [name] is a 76 y.o. female with  hx of  urothelial carcinoma (2020), ureterectomy with recurrence. She had L ureter stent replaced 6-1, replaced 6-15. She was treated for UTI x2 since then.  She comes to hospital on 7-14 sith fever, n/v, L flannk pain, weakness. She was hypotensive. Blood cultures are growing S hominis and capitis from 2 different sites with one also growing S epidermidis. Urine cultures are ,10K CFU  TTE is clean  #1 Polymicrobial bacteremia  --continue vancomycin --followup sensitivities  I would NOT pursue TEE  I would vote for switch to long acting oritavancin vs possible oral therapy as we near [place] but will discuss further with Heide Guile, Pharm D.   LOS: 4 days   Rhina Brackett Dam 04/25/2022, 3:16 PM

## 2022-04-26 ENCOUNTER — Other Ambulatory Visit (HOSPITAL_COMMUNITY): Payer: Self-pay

## 2022-04-26 ENCOUNTER — Telehealth (HOSPITAL_COMMUNITY): Payer: Self-pay | Admitting: Pharmacy Technician

## 2022-04-26 ENCOUNTER — Other Ambulatory Visit (HOSPITAL_COMMUNITY): Payer: Medicare PPO

## 2022-04-26 DIAGNOSIS — A4101 Sepsis due to Methicillin susceptible Staphylococcus aureus: Secondary | ICD-10-CM | POA: Diagnosis not present

## 2022-04-26 DIAGNOSIS — I5020 Unspecified systolic (congestive) heart failure: Secondary | ICD-10-CM | POA: Diagnosis not present

## 2022-04-26 DIAGNOSIS — N12 Tubulo-interstitial nephritis, not specified as acute or chronic: Secondary | ICD-10-CM | POA: Diagnosis not present

## 2022-04-26 DIAGNOSIS — A419 Sepsis, unspecified organism: Secondary | ICD-10-CM | POA: Diagnosis not present

## 2022-04-26 DIAGNOSIS — Z87891 Personal history of nicotine dependence: Secondary | ICD-10-CM

## 2022-04-26 DIAGNOSIS — J9601 Acute respiratory failure with hypoxia: Secondary | ICD-10-CM | POA: Diagnosis not present

## 2022-04-26 LAB — CBC
HCT: 26.2 % — ABNORMAL LOW (ref 36.0–46.0)
Hemoglobin: 8.3 g/dL — ABNORMAL LOW (ref 12.0–15.0)
MCH: 29.7 pg (ref 26.0–34.0)
MCHC: 31.7 g/dL (ref 30.0–36.0)
MCV: 93.9 fL (ref 80.0–100.0)
Platelets: 236 10*3/uL (ref 150–400)
RBC: 2.79 MIL/uL — ABNORMAL LOW (ref 3.87–5.11)
RDW: 17.4 % — ABNORMAL HIGH (ref 11.5–15.5)
WBC: 8.4 10*3/uL (ref 4.0–10.5)
nRBC: 0.2 % (ref 0.0–0.2)

## 2022-04-26 LAB — CULTURE, BLOOD (ROUTINE X 2)
Special Requests: ADEQUATE
Special Requests: ADEQUATE

## 2022-04-26 LAB — BASIC METABOLIC PANEL
Anion gap: 9 (ref 5–15)
BUN: 20 mg/dL (ref 8–23)
CO2: 28 mmol/L (ref 22–32)
Calcium: 8.4 mg/dL — ABNORMAL LOW (ref 8.9–10.3)
Chloride: 105 mmol/L (ref 98–111)
Creatinine, Ser: 1.4 mg/dL — ABNORMAL HIGH (ref 0.44–1.00)
GFR, Estimated: 40 mL/min — ABNORMAL LOW (ref >60)
Glucose, Bld: 109 mg/dL — ABNORMAL HIGH (ref 70–99)
Potassium: 3.8 mmol/L (ref 3.5–5.1)
Sodium: 142 mmol/L (ref 135–145)

## 2022-04-26 LAB — GLUCOSE, CAPILLARY: Glucose-Capillary: 103 mg/dL — ABNORMAL HIGH (ref 70–99)

## 2022-04-26 MED ORDER — POLYETHYLENE GLYCOL 3350 17 G PO PACK
17.0000 g | PACK | Freq: Every day | ORAL | Status: DC
  Administered 2022-04-26: 17 g via ORAL
  Filled 2022-04-26: qty 1

## 2022-04-26 MED ORDER — APIXABAN 5 MG PO TABS
10.0000 mg | ORAL_TABLET | Freq: Two times a day (BID) | ORAL | 0 refills | Status: DC
  Filled 2022-04-26: qty 16, 8d supply, fill #0

## 2022-04-26 MED ORDER — LINEZOLID 600 MG PO TABS
600.0000 mg | ORAL_TABLET | Freq: Two times a day (BID) | ORAL | 0 refills | Status: AC
  Filled 2022-04-26: qty 18, 9d supply, fill #0

## 2022-04-26 MED ORDER — APIXABAN 5 MG PO TABS
5.0000 mg | ORAL_TABLET | Freq: Two times a day (BID) | ORAL | 0 refills | Status: DC
  Filled 2022-04-26: qty 60, 30d supply, fill #0

## 2022-04-26 MED ORDER — LINEZOLID 600 MG PO TABS
600.0000 mg | ORAL_TABLET | Freq: Two times a day (BID) | ORAL | Status: DC
  Administered 2022-04-26: 600 mg via ORAL
  Filled 2022-04-26: qty 1

## 2022-04-26 NOTE — Progress Notes (Signed)
Subjective:  No new complaints  Antibiotics:  Anti-infectives (From admission, onward)    Start     Dose/Rate Route Frequency Ordered Stop   04/26/22 1200  linezolid (ZYVOX) tablet 600 mg        600 mg Oral Every 12 hours 04/26/22 1108 05/05/22 0959   04/24/22 2215  vancomycin (VANCOREADY) IVPB 1500 mg/300 mL  Status:  Discontinued        1,500 mg 150 mL/hr over 120 Minutes Intravenous Every 48 hours 04/24/22 2125 04/26/22 1138   04/24/22 0800  vancomycin (VANCOCIN) IVPB 1000 mg/200 mL premix  Status:  Discontinued        1,000 mg 200 mL/hr over 60 Minutes Intravenous Every 36 hours 04/23/22 1015 04/23/22 1300   04/23/22 2000  vancomycin (VANCOREADY) IVPB 750 mg/150 mL  Status:  Discontinued        750 mg 150 mL/hr over 60 Minutes Intravenous Every 24 hours 04/22/22 1932 04/23/22 1003   04/22/22 1945  vancomycin (VANCOREADY) IVPB 1500 mg/300 mL        1,500 mg 150 mL/hr over 120 Minutes Intravenous NOW 04/22/22 1859 04/22/22 2200   04/21/22 1900  cefTRIAXone (ROCEPHIN) 2 g in sodium chloride 0.9 % 100 mL IVPB  Status:  Discontinued        2 g 200 mL/hr over 30 Minutes Intravenous Every 24 hours 04/21/22 1848 04/24/22 1458       Medications: Scheduled Meds:  apixaban  10 mg Oral BID   Followed by   Derrill Memo ON 04/29/2022] apixaban  5 mg Oral BID   aspirin EC  81 mg Oral Daily   calcium carbonate  1 tablet Oral q morning   carvedilol  3.125 mg Oral BID WC   cholecalciferol  1,000 Units Oral Daily   docusate sodium  100 mg Oral BID   ezetimibe  10 mg Oral Q lunch   feeding supplement  237 mL Oral TID BM   isosorbide mononitrate  15 mg Oral Daily   linezolid  600 mg Oral Q12H   multivitamin with minerals  1 tablet Oral Daily   polyethylene glycol  17 g Oral Daily   rosuvastatin  40 mg Oral Daily   sertraline  50 mg Oral Daily   Continuous Infusions:   PRN Meds:.acetaminophen, ondansetron **OR** ondansetron (ZOFRAN) IV, oxybutynin, oxyCODONE,  traZODone    Objective: Weight change:   Intake/Output Summary (Last 24 hours) at 04/26/2022 1209 Last data filed at 04/25/2022 1750 Gross per 24 hour  Intake 600 ml  Output --  Net 600 ml    Blood pressure (!) 150/60, pulse 68, temperature 98.4 F (36.9 C), temperature source Oral, resp. rate 20, height '5\' 3"'$  (1.6 m), weight 80 kg, SpO2 94 %. Temp:  [97.6 F (36.4 C)-98.4 F (36.9 C)] 98.4 F (36.9 C) (07/19 0539) Pulse Rate:  [59-68] 68 (07/19 0539) Resp:  [17-20] 20 (07/19 0539) BP: (112-150)/(52-61) 150/60 (07/19 0539) SpO2:  [92 %-94 %] 94 % (07/19 0539)  Physical Exam: Physical Exam Constitutional:      General: She is not in acute distress.    Appearance: She is well-developed. She is not diaphoretic.  HENT:     Head: Normocephalic and atraumatic.     Right Ear: External ear normal.     Left Ear: External ear normal.     Mouth/Throat:     Pharynx: No oropharyngeal exudate.  Eyes:     General: No scleral icterus.  Conjunctiva/sclera: Conjunctivae normal.     Pupils: Pupils are equal, round, and reactive to light.  Cardiovascular:     Rate and Rhythm: Normal rate and regular rhythm.     Heart sounds:     No friction rub.  Pulmonary:     Effort: Pulmonary effort is normal. No respiratory distress.     Breath sounds: Normal breath sounds. No wheezing.  Abdominal:     General: There is no distension.     Palpations: Abdomen is soft.  Musculoskeletal:        General: No tenderness. Normal range of motion.  Lymphadenopathy:     Cervical: No cervical adenopathy.  Skin:    General: Skin is warm and dry.     Coloration: Skin is not pale.     Findings: No erythema or rash.  Neurological:     General: No focal deficit present.     Mental Status: She is alert and oriented to person, place, and time.     Motor: No abnormal muscle tone.  Psychiatric:        Mood and Affect: Mood normal.        Behavior: Behavior normal.        Thought Content: Thought content  normal.        Judgment: Judgment normal.      CBC:    BMET Recent Labs    04/25/22 0437 04/26/22 0425  NA 139 142  K 3.5 3.8  CL 105 105  CO2 27 28  GLUCOSE 99 109*  BUN 20 20  CREATININE 1.38* 1.40*  CALCIUM 7.9* 8.4*      Liver Panel  No results for input(s): "PROT", "ALBUMIN", "AST", "ALT", "ALKPHOS", "BILITOT", "BILIDIR", "IBILI" in the last 72 hours.     Sedimentation Rate No results for input(s): "ESRSEDRATE" in the last 72 hours. C-Reactive Protein No results for input(s): "CRP" in the last 72 hours.  Micro Results: Recent Results (from the past 720 hour(s))  Blood Culture (routine x 2)     Status: Abnormal   Collection Time: 04/21/22  7:05 PM   Specimen: BLOOD LEFT FOREARM  Result Value Ref Range Status   Specimen Description   Final    BLOOD LEFT FOREARM Performed at [facility], [address], [place], [place] [address]    Special Requests   Final    BOTTLES DRAWN AEROBIC AND ANAEROBIC Blood Culture adequate volume Performed at [facility] [address], [place], [place] [address]    Culture  Setup Time   Final    GRAM POSITIVE COCCI IN CLUSTERS IN BOTH AEROBIC AND ANAEROBIC BOTTLES CRITICAL RESULT CALLED TO, READ BACK BY AND VERIFIED WITH: PHARMD M.STEWYNE AT 1850 ON 04/22/2022 BY T.SAAD.    Culture (A)  Final    STAPHYLOCOCCUS CAPITIS THE SIGNIFICANCE OF ISOLATING THIS ORGANISM FROM A SINGLE SET OF BLOOD CULTURES WHEN MULTIPLE SETS ARE DRAWN IS UNCERTAIN. PLEASE NOTIFY THE MICROBIOLOGY DEPARTMENT WITHIN ONE WEEK IF SPECIATION AND SENSITIVITIES ARE REQUIRED. STAPHYLOCOCCUS HOMINIS STAPHYLOCOCCUS EPIDERMIDIS SUSCEPTIBILITIES PERFORMED ON PREVIOUS CULTURE WITHIN THE LAST 5 DAYS. Performed at [facility], [place] [address] [place],  [address]    Report Status 04/26/2022 FINAL  Final  Blood Culture ID Panel (Reflexed)     Status: Abnormal   Collection Time: 04/21/22  7:05 PM  Result Value Ref Range  Status   Enterococcus faecalis NOT DETECTED NOT DETECTED Final   Enterococcus Faecium NOT DETECTED NOT DETECTED Final  Listeria monocytogenes NOT DETECTED NOT DETECTED Final   Staphylococcus species DETECTED (A) NOT DETECTED Final    Comment: CRITICAL RESULT CALLED TO, READ BACK BY AND VERIFIED WITH: PHARMD M.STEWYNE AT 1850 ON 04/22/2022 BY T.SAAD.    Staphylococcus aureus (BCID) NOT DETECTED NOT DETECTED Final   Staphylococcus epidermidis DETECTED (A) NOT DETECTED Final    Comment: Methicillin (oxacillin) resistant coagulase negative staphylococcus. Possible blood culture contaminant (unless isolated from more than one blood culture draw or clinical case suggests pathogenicity). No antibiotic treatment is indicated for blood  culture contaminants. CRITICAL RESULT CALLED TO, READ BACK BY AND VERIFIED WITH: PHARMD M.STEWYNE AT 1850 ON 04/22/2022 BY T.SAAD.    Staphylococcus lugdunensis NOT DETECTED NOT DETECTED Final   Streptococcus species NOT DETECTED NOT DETECTED Final   Streptococcus agalactiae NOT DETECTED NOT DETECTED Final   Streptococcus pneumoniae NOT DETECTED NOT DETECTED Final   Streptococcus pyogenes NOT DETECTED NOT DETECTED Final   A.calcoaceticus-baumannii NOT DETECTED NOT DETECTED Final   Bacteroides fragilis NOT DETECTED NOT DETECTED Final   Enterobacterales NOT DETECTED NOT DETECTED Final   Enterobacter cloacae complex NOT DETECTED NOT DETECTED Final   Escherichia coli NOT DETECTED NOT DETECTED Final   Klebsiella aerogenes NOT DETECTED NOT DETECTED Final   Klebsiella oxytoca NOT DETECTED NOT DETECTED Final   Klebsiella pneumoniae NOT DETECTED NOT DETECTED Final   Proteus species NOT DETECTED NOT DETECTED Final   Salmonella species NOT DETECTED NOT DETECTED Final   Serratia marcescens NOT DETECTED NOT DETECTED Final   Haemophilus influenzae NOT DETECTED NOT DETECTED Final   Neisseria meningitidis NOT DETECTED NOT DETECTED Final   Pseudomonas aeruginosa NOT DETECTED  NOT DETECTED Final   Stenotrophomonas maltophilia NOT DETECTED NOT DETECTED Final   Candida albicans NOT DETECTED NOT DETECTED Final   Candida auris NOT DETECTED NOT DETECTED Final   Candida glabrata NOT DETECTED NOT DETECTED Final   Candida krusei NOT DETECTED NOT DETECTED Final   Candida parapsilosis NOT DETECTED NOT DETECTED Final   Candida tropicalis NOT DETECTED NOT DETECTED Final   Cryptococcus neoformans/gattii NOT DETECTED NOT DETECTED Final   Methicillin resistance mecA/C DETECTED (A) NOT DETECTED Final    Comment: CRITICAL RESULT CALLED TO, READ BACK BY AND VERIFIED WITH: PHARMD M.STEWYNE AT 1850 ON 04/22/2022 BY T.SAAD. Performed at [facility], [place] [address], [place], [place] [address]   Blood Culture (routine x 2)     Status: Abnormal   Collection Time: 04/21/22  7:10 PM   Specimen: BLOOD RIGHT FOREARM  Result Value Ref Range Status   Specimen Description   Final    BLOOD RIGHT FOREARM Performed at [facility], [place] [address] [place], [place] [address]    Special Requests   Final    BOTTLES DRAWN AEROBIC ONLY Blood Culture adequate volume Performed at [facility] [address], [place], [place] [address]    Culture  Setup Time   Final    GRAM POSITIVE COCCI IN CLUSTERS AEROBIC BOTTLE ONLY Performed at [facility], [place] [address] [place], [place] [address]    Culture (A)  Final    STAPHYLOCOCCUS EPIDERMIDIS STAPHYLOCOCCUS HOMINIS    Report Status 04/26/2022 FINAL  Final   Organism ID, Bacteria STAPHYLOCOCCUS EPIDERMIDIS  Final   Organism ID, Bacteria STAPHYLOCOCCUS HOMINIS  Final      Susceptibility   Staphylococcus epidermidis - MIC*    CIPROFLOXACIN >=8 RESISTANT Resistant     ERYTHROMYCIN >=8 RESISTANT Resistant  GENTAMICIN <=0.5 SENSITIVE Sensitive     OXACILLIN >=4 RESISTANT Resistant     TETRACYCLINE 2 SENSITIVE Sensitive     VANCOMYCIN <=0.5 SENSITIVE Sensitive     TRIMETH/SULFA 80 RESISTANT Resistant      CLINDAMYCIN >=8 RESISTANT Resistant     RIFAMPIN <=0.5 SENSITIVE Sensitive     Inducible Clindamycin NEGATIVE Sensitive     * STAPHYLOCOCCUS EPIDERMIDIS   Staphylococcus hominis - MIC*    CIPROFLOXACIN >=8 RESISTANT Resistant     ERYTHROMYCIN 0.5 SENSITIVE Sensitive     GENTAMICIN <=0.5 SENSITIVE Sensitive     OXACILLIN RESISTANT Resistant     TETRACYCLINE >=16 RESISTANT Resistant     VANCOMYCIN <=0.5 SENSITIVE Sensitive     TRIMETH/SULFA 20 SENSITIVE Sensitive     CLINDAMYCIN <=0.25 SENSITIVE Sensitive     RIFAMPIN <=0.5 SENSITIVE Sensitive     Inducible Clindamycin NEGATIVE Sensitive     * STAPHYLOCOCCUS HOMINIS  Resp Panel by RT-PCR (Flu A&B, Covid) Anterior Nasal Swab     Status: None   Collection Time: 04/21/22  7:37 PM   Specimen: Anterior Nasal Swab  Result Value Ref Range Status   SARS Coronavirus 2 by RT PCR NEGATIVE NEGATIVE Final    Comment: (NOTE) SARS-CoV-2 target nucleic acids are NOT DETECTED.  The SARS-CoV-2 RNA is generally detectable in upper respiratory specimens during the acute phase of infection. The lowest concentration of SARS-CoV-2 viral copies this assay can detect is 138 copies/mL. A negative result does not preclude SARS-Cov-2 infection and should not be used as the sole basis for treatment or other patient management decisions. A negative result may occur with  improper specimen collection/handling, submission of specimen other than nasopharyngeal swab, presence of viral mutation(s) within the areas targeted by this assay, and inadequate number of viral copies(<138 copies/mL). A negative result must be combined with clinical observations, patient history, and epidemiological information. The expected result is Negative.  Fact Sheet for Patients:  EntrepreneurPulse.com.au  Fact Sheet for Healthcare Providers:  IncredibleEmployment.be  This test is no t yet approved or cleared by the [place] FDA and   has been authorized for detection and/or diagnosis of SARS-CoV-2 by FDA under an Emergency Use Authorization (EUA). This EUA will remain  in effect (meaning this test can be used) for the duration of the COVID-19 declaration under Section 564(b)(1) of the Act, 21 U.S.C.section 360bbb-3(b)(1), unless the authorization is terminated  or revoked sooner.       Influenza A by PCR NEGATIVE NEGATIVE Final   Influenza B by PCR NEGATIVE NEGATIVE Final    Comment: (NOTE) The Xpert Xpress SARS-CoV-2/FLU/RSV plus assay is intended as an aid in the diagnosis of influenza from Nasopharyngeal swab specimens and should not be used as a sole basis for treatment. Nasal washings and aspirates are unacceptable for Xpert Xpress SARS-CoV-2/FLU/RSV testing.  Fact Sheet for Patients: EntrepreneurPulse.com.au  Fact Sheet for Healthcare Providers: IncredibleEmployment.be  This test is not yet approved or cleared by the [place] FDA and has been authorized for detection and/or diagnosis of SARS-CoV-2 by FDA under an Emergency Use Authorization (EUA). This EUA will remain in effect (meaning this test can be used) for the duration of the COVID-19 declaration under Section 564(b)(1) of the Act, 21 U.S.C. section 360bbb-3(b)(1), unless the authorization is terminated or revoked.  Performed at [facility], [place] [address] [place], [place] [address]   Urine Culture     Status: Abnormal   Collection Time: 04/22/22 12:31 AM   Specimen:  In/Out Cath Urine  Result Value Ref Range Status   Specimen Description   Final    IN/OUT CATH URINE Performed at [facility], [place] [address] [place], [place] [address]    Special Requests   Final    NONE Performed at [facility], [place] [address], [place], [place] [address]    Culture MULTIPLE SPECIES PRESENT, SUGGEST RECOLLECTION (A)  Final   Report Status 04/23/2022  FINAL  Final  Urine Culture     Status: Abnormal   Collection Time: 04/23/22  2:29 PM   Specimen: Urine, Clean Catch  Result Value Ref Range Status   Specimen Description   Final    URINE, CLEAN CATCH Performed at [facility], [place] [address] [place], [place] [address]    Special Requests   Final    NONE Performed at [facility], [place] [address], [place], [place] [address]    Culture (A)  Final    <10,000 COLONIES/mL INSIGNIFICANT GROWTH Performed at [facility] [address], [place], [place] [address]    Report Status 04/24/2022 FINAL  Final  Culture, blood (Routine X 2) w Reflex to ID Panel     Status: None (Preliminary result)   Collection Time: 04/24/22  3:36 PM   Specimen: BLOOD  Result Value Ref Range Status   Specimen Description   Final    BLOOD BLOOD LEFT HAND Performed at [facility] [address], [place], [place] [address]    Special Requests   Final    BOTTLES DRAWN AEROBIC ONLY Blood Culture adequate volume Performed at [facility] [address], [place], [place] [address]    Culture   Final    NO GROWTH 2 DAYS Performed at [facility] [address] [place], [place] [address]    Report Status PENDING  Incomplete  Culture, blood (Routine X 2) w Reflex to ID Panel     Status: None (Preliminary result)   Collection Time: 04/24/22  3:36 PM   Specimen: BLOOD  Result Value Ref Range Status   Specimen Description   Final    BLOOD RIGHT ANTECUBITAL Performed at [facility] [address], [place], [place] [address]    Special Requests   Final    BOTTLES DRAWN AEROBIC ONLY Blood Culture adequate volume Performed at [facility] [address], [place], [place] [address]    Culture   Final    NO GROWTH 2 DAYS Performed at [facility] [address], [place], [place] [address]    Report Status PENDING  Incomplete     Studies/Results: No results found.    Assessment/Plan:  INTERVAL HISTORY: Sensitivity data is back  Principal Problem:   Sepsis without acute organ dysfunction (HCC) Active Problems:   Acute respiratory failure with hypoxia (HCC)   HFrEF (heart failure with reduced ejection fraction) ([place])   Pyelonephritis   Acute pulmonary embolism ([place])   Urothelial carcinoma ([place])    [name] is a 76 y.o. female with  hx of  urothelial carcinoma (2020), ureterectomy with recurrence. She had L ureter stent replaced 6-1, replaced 6-15. She was treated for UTI x2 since then.  She comes to hospital on 7-14 sith fever, n/v, L flannk pain, weakness. She was hypotensive. Blood cultures are growing S hominis and capitis from 2 different sites with one also growing S epidermidis. Urine cultures are ,10K  CFU  TTE is clean  #1 Polymicrobial bacteremia  The sensitivities of organisms have been reviewed  We will plan on giving her Zyvox 600 mg twice daily for the next 9 days.  Provided the Zyvox can be provided to her I think that she could be safely discharged.  She does not need to follow-up with [place] and unless she so desires her PCP does.    I will sign off for now please call with further questions.   LOS: 5 days   Alcide Evener 04/26/2022, 12:09 PM

## 2022-04-26 NOTE — Discharge Summary (Signed)
[facility] Discharge Summary  [name] IPJ:[id number] DOB: 03-14-1946 DOA: 04/21/2022  PCP: [name], MD  Admit date: 04/21/2022 Discharge date: 04/26/2022 30 Day Unplanned Readmission Risk Score    Flowsheet Row ED to Hosp-Admission (Current) from 04/21/2022 in [facility]  30 Day Unplanned Readmission Risk Score (%) 25.96 Filed at 04/26/2022 1200       This score is the patient's risk of an unplanned readmission within 30 days of being discharged (0 -100%). The score is based on dignosis, age, lab data, medications, orders, and past utilization.   Low:  0-14.9   Medium: 15-21.9   High: 22-29.9   Extreme: 30 and above          Admitted From: Home Disposition: Home  Recommendations for Outpatient Follow-up:  Follow up with PCP in 1-2 weeks Please obtain BMP/CBC in one week Please follow up with your PCP on the following pending results: Unresulted Labs (From admission, onward)    None         Home Health: Yes Equipment/Devices: None  Discharge Condition: Stable CODE STATUS: Full code Diet recommendation: Cardiac  Subjective: Seen and examined.  She feels well and desires to go home.  ID has cleared her for discharge.  Brief/Interim Summary: [name] is a 76 y.o. female with medical history significant of moderate nonobstructive CAD (70% Diag disease 11/05/20), HFmrEF, HTN ,CKD IV, depression , GERD, urothelial carcinoma s/p ureterectomy further complicated by recurrence on left for which she is followed by urology and oncology.  Left ureteral stent was removed on 03/09/2022 and left ureteral stent was placed on 03/23/2022.  She said she has had 2 rounds of antibiotics for UTIs since that time. Further interim history notes last chemo therapy 2 weeks ago.  She said chemotherapy has been discontinued because of intolerance to treatment.  She presented to the hospital because of fever, nausea, vomiting,  cough, left flank pain, generalized weakness and a fall at home.  She was found to have sepsis secondary to left-sided pyelonephritis.  Admitted under hospitalist service.  She was then found to have bacteremia and PE, details as below.     Fever, severe sepsis secondary to acute left pyelonephritis, recent left ureteral stent placement on 03/23/2022, recent use of antibiotics for complicated UTI:   - Dr. Mal Misty notes a consult from Dr. Tresa Moore, urologist but no formal consult placed.  -Of note, BCID was initially reported as Staph epidermidis.  Patient was empirically started on IV vancomycin.  ID was consulted. Blood cultures are growing S hominis and capitis from 2 different sites with one also growing S epidermidis. Urine cultures are ,10K CFU.  TTE was clean.  She was on vancomycin.  ID has now recommended 9 days of oral Zyvox and has cleared her for discharge.   Hypotension:  -Hydralazine was on hold but now blood pressure is rising so we will resume all her antihypertensives from home including Coreg.  Small Acute right-sided pulmonary embolism:  -CT scan unimpressive with very small PE but she was started on Eliquis by prior MD which we will continue however I will advocate for short duration of anticoagulation, such as 3 months but will defer to her PCP to further decide at 77-monthvisit.  Acute hypoxic respiratory failure: On room air now.  Urothelial carcinoma: Most recent chemotherapy was about 2 weeks prior to admission.  Patient said treatment has been discontinued because of intolerance to treatment.  Outpatient follow-up  with urologist and oncologist.   CKD stage IV: Creatinine is stable -baseline CR around 1.3   Chronic diastolic CHF: Compensated.  2D echo in May 2022 showed EF estimated at 70 to 63%, grade 1 diastolic dysfunction.     Obesity Estimated body mass index is 31.24 kg/m as calculated from the following:   Height as of this encounter: '5\' 3"'$  (1.6 m).   Weight as of  this encounter: 80 kg.  Weight loss counseled.  Discharge plan was discussed with patient and/or family member and they verbalized understanding and agreed with it.  Discharge Diagnoses:  Principal Problem:   Sepsis without acute organ dysfunction [facility]) Active Problems:   Acute respiratory failure with hypoxia (HCC)   HFrEF (heart failure with reduced ejection fraction) (HCC)   Pyelonephritis   Acute pulmonary embolism ([facility])   Urothelial carcinoma ([facility])    Discharge Instructions   Allergies as of 04/26/2022       Reactions   Erythromycin Diarrhea, Nausea And Vomiting   Lotensin [benazepril Hcl] Cough        Medication List     STOP taking these medications    ciprofloxacin 250 MG tablet Commonly known as: CIPRO   doxycycline 100 MG tablet Commonly known as: VIBRA-TABS       TAKE these medications    acetaminophen 500 MG tablet Commonly known as: TYLENOL Take 500-1,000 mg by mouth every 6 (six) hours as needed (pain).   apixaban 5 MG Tabs tablet Commonly known as: ELIQUIS Take 2 tablets (10 mg total) by mouth 2 (two) times daily for 4 days.   apixaban 5 MG Tabs tablet Commonly known as: ELIQUIS Take 1 tablet (5 mg total) by mouth 2 (two) times daily. Start taking on: April 29, 2022   aspirin EC 81 MG tablet Take 1 tablet (81 mg total) by mouth daily. What changed: when to take this   calcium carbonate 500 MG chewable tablet Commonly known as: TUMS - dosed in mg elemental calcium Chew 1 tablet by mouth every morning.   calcium carbonate 750 MG chewable tablet Commonly known as: TUMS EX Chew 1 tablet by mouth every morning.   carvedilol 6.25 MG tablet Commonly known as: COREG TAKE 1 TABLET(6.25 MG) BY MOUTH TWICE DAILY WITH A MEAL What changed:  how much to take how to take this when to take this additional instructions   cholecalciferol 25 MCG (1000 UNIT) tablet Commonly known as: VITAMIN D3 Take 1,000 Units by mouth every morning.    ezetimibe 10 MG tablet Commonly known as: ZETIA Take 1 tablet (10 mg total) by mouth daily. What changed: when to take this   hydrALAZINE 50 MG tablet Commonly known as: APRESOLINE TAKE 1 TABLET(50 MG) BY MOUTH EVERY 8 HOURS What changed: See the new instructions.   HYDROcodone-acetaminophen 5-325 MG tablet Commonly known as: NORCO/VICODIN Take 1 tablet by mouth every 6 (six) hours as needed for pain.   isosorbide mononitrate 30 MG 24 hr tablet Commonly known as: IMDUR Take 0.5 tablets (15 mg total) by mouth daily. What changed: when to take this   linezolid 600 MG tablet Commonly known as: ZYVOX Take 1 tablet (600 mg total) by mouth every 12 (twelve) hours for 9 days.   oxybutynin 5 MG tablet Commonly known as: DITROPAN Take 5 mg by mouth every 6 (six) hours as needed (bladder pain).   oxyCODONE 5 MG immediate release tablet Commonly known as: Roxicodone Take 1 tablet (5 mg total) by mouth every 6 (  six) hours as needed for severe pain.   rosuvastatin 40 MG tablet Commonly known as: CRESTOR Take 1 tablet (40 mg total) by mouth daily. What changed: when to take this   sertraline 50 MG tablet Commonly known as: ZOLOFT Take 50 mg by mouth every morning.   traZODone 150 MG tablet Commonly known as: DESYREL Take 150 mg by mouth at bedtime.        Follow-up Information     [name], MD Follow up in 1 week(s).   Specialty: Family Medicine Contact information: [place] [address] [phone]         Werner Lean, MD .   Specialty: Cardiology Contact information: [address] [place] [address] [phone]                Allergies  Allergen Reactions   Erythromycin Diarrhea and Nausea And Vomiting   Lotensin [Benazepril Hcl] Cough    Consultations: ID   Procedures/Studies: VAS [place] LOWER EXTREMITY VENOUS (DVT)  Result Date: 04/24/2022  Lower Venous DVT Study Patient Name:   KALLEE NAM  Date of Exam:   04/24/2022 Medical Rec #: [id number]          Accession #:    [id number] Date of Birth: 12-Nov-1945          Patient Gender: F Patient Age:   37 years Exam Location:  [facility] Procedure:      VAS [place] LOWER EXTREMITY VENOUS (DVT) Referring Phys: Eulogio Bear --------------------------------------------------------------------------------  Indications: Pulmonary embolism.  Risk Factors: Confirmed PE. Comparison Study: No prior studies. Performing Technologist: Oliver Hum RVT  Examination Guidelines: A complete evaluation includes B-mode imaging, spectral Doppler, color Doppler, and power Doppler as needed of all accessible portions of each vessel. Bilateral testing is considered an integral part of a complete examination. Limited examinations for reoccurring indications may be performed as noted. The reflux portion of the exam is performed with the patient in reverse Trendelenburg.  +---------+---------------+---------+-----------+----------+--------------+ RIGHT    CompressibilityPhasicitySpontaneityPropertiesThrombus Aging +---------+---------------+---------+-----------+----------+--------------+ CFV      Full           Yes      Yes                                 +---------+---------------+---------+-----------+----------+--------------+ SFJ      Full                                                        +---------+---------------+---------+-----------+----------+--------------+ FV Prox  Full                                                        +---------+---------------+---------+-----------+----------+--------------+ FV Mid   Full                                                        +---------+---------------+---------+-----------+----------+--------------+ FV DistalFull                                                        +---------+---------------+---------+-----------+----------+--------------+  PFV      Full                                                         +---------+---------------+---------+-----------+----------+--------------+ POP      Full           Yes      Yes                                 +---------+---------------+---------+-----------+----------+--------------+ PTV      Full                                                        +---------+---------------+---------+-----------+----------+--------------+ PERO     Full                                                        +---------+---------------+---------+-----------+----------+--------------+   +---------+---------------+---------+-----------+----------+--------------+ LEFT     CompressibilityPhasicitySpontaneityPropertiesThrombus Aging +---------+---------------+---------+-----------+----------+--------------+ CFV      Full           Yes      Yes                                 +---------+---------------+---------+-----------+----------+--------------+ SFJ      Full                                                        +---------+---------------+---------+-----------+----------+--------------+ FV Prox  Full                                                        +---------+---------------+---------+-----------+----------+--------------+ FV Mid   Full                                                        +---------+---------------+---------+-----------+----------+--------------+ FV DistalFull                                                        +---------+---------------+---------+-----------+----------+--------------+ PFV      Full                                                        +---------+---------------+---------+-----------+----------+--------------+  POP      Full           Yes      Yes                                 +---------+---------------+---------+-----------+----------+--------------+ PTV      Full                                                         +---------+---------------+---------+-----------+----------+--------------+ PERO     Full                                                        +---------+---------------+---------+-----------+----------+--------------+     Summary: RIGHT: - There is no evidence of deep vein thrombosis in the lower extremity.  - No cystic structure found in the popliteal fossa.  LEFT: - There is no evidence of deep vein thrombosis in the lower extremity.  - No cystic structure found in the popliteal fossa.  *See table(s) above for measurements and observations. Electronically signed by Harold Barban MD on 04/24/2022 at 1:33:51 PM.    Final    CT Angio Chest Pulmonary Embolism (PE) W or WO Contrast  Addendum Date: 04/22/2022   ADDENDUM REPORT: 04/22/2022 09:18 ADDENDUM: Study discussed by telephone with Dr. Mal Misty on 04/22/2022 at 0910 hours. Electronically Signed   By: Genevie Ann M.D.   On: 04/22/2022 09:18   Result Date: 04/22/2022 CLINICAL DATA:  76 year old female with sepsis. Left ureteral carcinoma, stent. EXAM: CT ANGIOGRAPHY CHEST WITH CONTRAST TECHNIQUE: Multidetector CT imaging of the chest was performed using the standard protocol during bolus administration of intravenous contrast. Multiplanar CT image reconstructions and MIPs were obtained to evaluate the vascular anatomy. RADIATION DOSE REDUCTION: This exam was performed according to the departmental dose-optimization program which includes automated exposure control, adjustment of the mA and/or kV according to patient size and/or use of iterative reconstruction technique. CONTRAST:  63m OMNIPAQUE IOHEXOL 350 MG/ML SOLN COMPARISON:  CT Abdomen and Pelvis yesterday. PET-CT 04/05/2022. FINDINGS: Cardiovascular: Good contrast bolus timing in the pulmonary arterial tree. Mild respiratory motion. Questionable small nonocclusive thrombus right lower lobe at the segmental branching level series 5, image 151. No other pulmonary artery filling defect. No central  or saddle embolus. Calcified coronary artery atherosclerosis. No cardiomegaly or pericardial effusion. Calcified aortic atherosclerosis. Mediastinum/Nodes: Negative. No mediastinal mass or lymphadenopathy. Lungs/Pleura: Layering and enhancing dependent atelectasis in both lungs. Major airways are patent. There is mild bilateral bronchial wall thickening. Mild biapical pulmonary septal thickening. No consolidation. No pleural effusion. Upper Abdomen: Stable visible upper abdominal viscera. Musculoskeletal: Thoracic spine degeneration. No acute or suspicious osseous lesion. Review of the MIP images confirms the above findings. IMPRESSION: 1. Evidence of only a solitary, small nonocclusive thrombus in the right lower lobe at the segmental branching level. Significance is doubtful, with no other pulmonary embolus identified. 2. Bilateral pulmonary atelectasis. No pleural effusion or evidence of pulmonary infarct. 3. Calcified coronary artery atherosclerosis. Aortic Atherosclerosis (ICD10-I70.0). Electronically Signed: By: HGenevie AnnM.D. On: 04/22/2022 09:03   CT ABDOMEN PELVIS W CONTRAST  Result  Date: 04/21/2022 CLINICAL DATA:  Sepsis Abdominal/flank pain, hematuria (Ped 0-17y). Prior right nephrectomy for urothelial carcinoma. EXAM: CT ABDOMEN AND PELVIS WITH CONTRAST TECHNIQUE: Multidetector CT imaging of the abdomen and pelvis was performed using the standard protocol following bolus administration of intravenous contrast. RADIATION DOSE REDUCTION: This exam was performed according to the departmental dose-optimization program which includes automated exposure control, adjustment of the mA and/or kV according to patient size and/or use of iterative reconstruction technique. CONTRAST:  37m OMNIPAQUE IOHEXOL 300 MG/ML  SOLN COMPARISON:  03/11/2022.  PET CT 04/05/2022 FINDINGS: Lower chest: No acute abnormality Hepatobiliary: No focal hepatic abnormality. Gallbladder unremarkable. Pancreas: No focal abnormality or  ductal dilatation. Spleen: No focal abnormality.  Normal size. Adrenals/Urinary Tract: Prior right nephrectomy. Left ureteral stent remains in place. No hydronephrosis. There is mild left perinephric stranding which is increased since prior study. Adrenal glands and urinary bladder unremarkable. Stomach/Bowel: Stomach, large and small bowel grossly unremarkable. Normal appendix. Vascular/Lymphatic: Heavily calcified aorta. No aneurysm. Mildly prominent left periaortic lymph nodes. Index node has a short axis diameter of 7 mm. None pathologically enlarged or changed since prior study. Reproductive: Prior hysterectomy.  No adnexal masses. Other: No free fluid or free air. Musculoskeletal: No acute bony abnormality. IMPRESSION: Left ureteral stent remains in place. There is mild left perinephric stranding which is increased since prior study. No hydronephrosis. Aortic atherosclerosis. Sigmoid diverticulosis. Electronically Signed   By: KRolm BaptiseM.D.   On: 04/21/2022 20:25   DG Chest Port 1 View  Result Date: 04/21/2022 CLINICAL DATA:  Questionable sepsis - evaluate for abnormality EXAM: PORTABLE CHEST 1 VIEW COMPARISON:  11/27/2018 FINDINGS: Heart and mediastinal contours are within normal limits. No focal opacities or effusions. No acute bony abnormality. IMPRESSION: No active disease. Electronically Signed   By: KRolm BaptiseM.D.   On: 04/21/2022 20:03   NM PET Image Initial (PI) Skull Base To Thigh (F-18 FDG)  Result Date: 04/05/2022 CLINICAL DATA:  Initial treatment strategy for left ureteral carcinoma. Left ureteral stent placement 03/23/2022. Remote right nephrectomy. EXAM: NUCLEAR MEDICINE PET SKULL BASE TO THIGH TECHNIQUE: 8.5 mCi F-18 FDG was injected intravenously. Full-ring PET imaging was performed from the skull base to thigh after the radiotracer. CT data was obtained and used for attenuation correction and anatomic localization. Fasting blood glucose: 112 mg/dl COMPARISON:  Abdominopelvic CT  03/11/2022 and 03/02/2022. FINDINGS: Mediastinal blood pool activity: SUV max 3.1 NECK: No hypermetabolic cervical lymph nodes are identified.Mildly prominent activity within the lymphoid tissue of Waldeyer's ring, likely physiologic. Incidental CT findings: Bilateral carotid atherosclerosis. CHEST: There are no hypermetabolic mediastinal, hilar or axillary lymph nodes. No hypermetabolic pulmonary activity or suspicious nodularity. Incidental CT findings: Mild scarring at both lung apices. Atherosclerosis of the aorta, great vessels and coronary arteries. Small hiatal hernia. ABDOMEN/PELVIS: There is no hypermetabolic activity within the liver, adrenal glands, spleen or pancreas. There is no hypermetabolic nodal activity. Status post right nephrectomy without abnormal activity the nephrectomy bed. There is activity within the subcutaneous fat of the left upper back along the percutaneous nephrostomy tract. The percutaneous nephrostomy has been removed since the prior CT of 3 weeks ago. Double-J left ureteral stent remains in place with stable mild soft tissue stranding around the left renal pelvis. Incidental CT findings: Severe aortic and branch vessel atherosclerosis. Mild distal colonic diverticulosis. SKELETON: There is no hypermetabolic activity to suggest osseous metastatic disease. Incidental CT findings: Prominent peritrochanteric soft tissue activity at both hips consistent with gluteus tendinosis. Mild lumbar spine facet  arthropathy. IMPRESSION: 1. No findings suspicious for metastatic urothelial carcinoma. 2. No unexpected activity within the left ureter or retroperitoneum following left ureteral stent placement. There is a small amount of soft tissue activity along the tract of the recently removed percutaneous nephrostomy without focal fluid collection. 3. Coronary and Aortic Atherosclerosis (ICD10-I70.0) with severe aortoiliac involvement. Electronically Signed   By: Richardean Sale M.D.   On:  04/05/2022 17:02     Discharge Exam: Vitals:   04/25/22 2047 04/26/22 0539  BP: 117/61 (!) 150/60  Pulse: 61 68  Resp: 20 20  Temp: 98.1 F (36.7 C) 98.4 F (36.9 C)  SpO2: 94% 94%   Vitals:   04/25/22 1406 04/25/22 1444 04/25/22 2047 04/26/22 0539  BP:   117/61 (!) 150/60  Pulse:   61 68  Resp: '20 17 20 20  '$ Temp:   98.1 F (36.7 C) 98.4 F (36.9 C)  TempSrc:   Oral Oral  SpO2:   94% 94%  Weight:      Height:        General: Pt is alert, awake, not in acute distress Cardiovascular: RRR, S1/S2 +, no rubs, no gallops Respiratory: CTA bilaterally, no wheezing, no rhonchi Abdominal: Soft, NT, ND, bowel sounds + Extremities: no edema, no cyanosis    The results of significant diagnostics from this hospitalization (including imaging, microbiology, ancillary and laboratory) are listed below for reference.     Microbiology: Recent Results (from the past 240 hour(s))  Blood Culture (routine x 2)     Status: Abnormal   Collection Time: 04/21/22  7:05 PM   Specimen: BLOOD LEFT FOREARM  Result Value Ref Range Status   Specimen Description   Final    BLOOD LEFT FOREARM Performed at [facility], [address], [place], [place] [address]    Special Requests   Final    BOTTLES DRAWN AEROBIC AND ANAEROBIC Blood Culture adequate volume Performed at [facility] [address] [place], [place] [address]    Culture  Setup Time   Final    GRAM POSITIVE COCCI IN CLUSTERS IN BOTH AEROBIC AND ANAEROBIC BOTTLES CRITICAL RESULT CALLED TO, READ BACK BY AND VERIFIED WITH: PHARMD M.STEWYNE AT 1850 ON 04/22/2022 BY T.SAAD.    Culture (A)  Final    STAPHYLOCOCCUS CAPITIS THE SIGNIFICANCE OF ISOLATING THIS ORGANISM FROM A SINGLE SET OF BLOOD CULTURES WHEN MULTIPLE SETS ARE DRAWN IS UNCERTAIN. PLEASE NOTIFY THE MICROBIOLOGY DEPARTMENT WITHIN ONE WEEK IF SPECIATION AND SENSITIVITIES ARE REQUIRED. STAPHYLOCOCCUS HOMINIS STAPHYLOCOCCUS  EPIDERMIDIS SUSCEPTIBILITIES PERFORMED ON PREVIOUS CULTURE WITHIN THE LAST 5 DAYS. Performed at [facility], [place] [address] [place], [place] [address]    Report Status 04/26/2022 FINAL  Final  Blood Culture ID Panel (Reflexed)     Status: Abnormal   Collection Time: 04/21/22  7:05 PM  Result Value Ref Range Status   Enterococcus faecalis NOT DETECTED NOT DETECTED Final   Enterococcus Faecium NOT DETECTED NOT DETECTED Final   Listeria monocytogenes NOT DETECTED NOT DETECTED Final   Staphylococcus species DETECTED (A) NOT DETECTED Final    Comment: CRITICAL RESULT CALLED TO, READ BACK BY AND VERIFIED WITH: PHARMD M.STEWYNE AT 1850 ON 04/22/2022 BY T.SAAD.    Staphylococcus aureus (BCID) NOT DETECTED NOT DETECTED Final   Staphylococcus epidermidis DETECTED (A) NOT DETECTED Final    Comment: Methicillin (oxacillin) resistant coagulase negative staphylococcus. Possible blood culture contaminant (unless isolated from more than one blood culture draw or clinical case suggests  pathogenicity). No antibiotic treatment is indicated for blood  culture contaminants. CRITICAL RESULT CALLED TO, READ BACK BY AND VERIFIED WITH: PHARMD M.STEWYNE AT 1850 ON 04/22/2022 BY T.SAAD.    Staphylococcus lugdunensis NOT DETECTED NOT DETECTED Final   Streptococcus species NOT DETECTED NOT DETECTED Final   Streptococcus agalactiae NOT DETECTED NOT DETECTED Final   Streptococcus pneumoniae NOT DETECTED NOT DETECTED Final   Streptococcus pyogenes NOT DETECTED NOT DETECTED Final   A.calcoaceticus-baumannii NOT DETECTED NOT DETECTED Final   Bacteroides fragilis NOT DETECTED NOT DETECTED Final   Enterobacterales NOT DETECTED NOT DETECTED Final   Enterobacter cloacae complex NOT DETECTED NOT DETECTED Final   Escherichia coli NOT DETECTED NOT DETECTED Final   Klebsiella aerogenes NOT DETECTED NOT DETECTED Final   Klebsiella oxytoca NOT DETECTED NOT DETECTED Final   Klebsiella pneumoniae NOT DETECTED NOT  DETECTED Final   Proteus species NOT DETECTED NOT DETECTED Final   Salmonella species NOT DETECTED NOT DETECTED Final   Serratia marcescens NOT DETECTED NOT DETECTED Final   Haemophilus influenzae NOT DETECTED NOT DETECTED Final   Neisseria meningitidis NOT DETECTED NOT DETECTED Final   Pseudomonas aeruginosa NOT DETECTED NOT DETECTED Final   Stenotrophomonas maltophilia NOT DETECTED NOT DETECTED Final   Candida albicans NOT DETECTED NOT DETECTED Final   Candida auris NOT DETECTED NOT DETECTED Final   Candida glabrata NOT DETECTED NOT DETECTED Final   Candida krusei NOT DETECTED NOT DETECTED Final   Candida parapsilosis NOT DETECTED NOT DETECTED Final   Candida tropicalis NOT DETECTED NOT DETECTED Final   Cryptococcus neoformans/gattii NOT DETECTED NOT DETECTED Final   Methicillin resistance mecA/C DETECTED (A) NOT DETECTED Final    Comment: CRITICAL RESULT CALLED TO, READ BACK BY AND VERIFIED WITH: PHARMD M.STEWYNE AT 1850 ON 04/22/2022 BY T.SAAD. Performed at [facility], [place] [address] [place], [place] [address]   Blood Culture (routine x 2)     Status: Abnormal   Collection Time: 04/21/22  7:10 PM   Specimen: BLOOD RIGHT FOREARM  Result Value Ref Range Status   Specimen Description   Final    BLOOD RIGHT FOREARM Performed at [facility], [place] [address], [place], [place] [address]    Special Requests   Final    BOTTLES DRAWN AEROBIC ONLY Blood Culture adequate volume Performed at [place] [address] [place],  [address]    Culture  Setup Time   Final    GRAM POSITIVE COCCI IN CLUSTERS AEROBIC BOTTLE ONLY Performed at [facility], [place] [address] [place],  [address]    Culture (A)  Final    STAPHYLOCOCCUS EPIDERMIDIS STAPHYLOCOCCUS HOMINIS    Report Status 04/26/2022 FINAL  Final   Organism ID, Bacteria STAPHYLOCOCCUS EPIDERMIDIS  Final   Organism ID, Bacteria STAPHYLOCOCCUS HOMINIS  Final       Susceptibility   Staphylococcus epidermidis - MIC*    CIPROFLOXACIN >=8 RESISTANT Resistant     ERYTHROMYCIN >=8 RESISTANT Resistant     GENTAMICIN <=0.5 SENSITIVE Sensitive     OXACILLIN >=4 RESISTANT Resistant     TETRACYCLINE 2 SENSITIVE Sensitive     VANCOMYCIN <=0.5 SENSITIVE Sensitive     TRIMETH/SULFA 80 RESISTANT Resistant     CLINDAMYCIN >=8 RESISTANT Resistant     RIFAMPIN <=0.5 SENSITIVE Sensitive     Inducible Clindamycin NEGATIVE Sensitive     * STAPHYLOCOCCUS EPIDERMIDIS   Staphylococcus hominis - MIC*    CIPROFLOXACIN >=8 RESISTANT Resistant  ERYTHROMYCIN 0.5 SENSITIVE Sensitive     GENTAMICIN <=0.5 SENSITIVE Sensitive     OXACILLIN RESISTANT Resistant     TETRACYCLINE >=16 RESISTANT Resistant     VANCOMYCIN <=0.5 SENSITIVE Sensitive     TRIMETH/SULFA 20 SENSITIVE Sensitive     CLINDAMYCIN <=0.25 SENSITIVE Sensitive     RIFAMPIN <=0.5 SENSITIVE Sensitive     Inducible Clindamycin NEGATIVE Sensitive     * STAPHYLOCOCCUS HOMINIS  Resp Panel by RT-PCR (Flu A&B, Covid) Anterior Nasal Swab     Status: None   Collection Time: 04/21/22  7:37 PM   Specimen: Anterior Nasal Swab  Result Value Ref Range Status   SARS Coronavirus 2 by RT PCR NEGATIVE NEGATIVE Final    Comment: (NOTE) SARS-CoV-2 target nucleic acids are NOT DETECTED.  The SARS-CoV-2 RNA is generally detectable in upper respiratory specimens during the acute phase of infection. The lowest concentration of SARS-CoV-2 viral copies this assay can detect is 138 copies/mL. A negative result does not preclude SARS-Cov-2 infection and should not be used as the sole basis for treatment or other patient management decisions. A negative result may occur with  improper specimen collection/handling, submission of specimen other than nasopharyngeal swab, presence of viral mutation(s) within the areas targeted by this assay, and inadequate number of viral copies(<138 copies/mL). A negative result must be combined  with clinical observations, patient history, and epidemiological information. The expected result is Negative.  Fact Sheet for Patients:  EntrepreneurPulse.com.au  Fact Sheet for Healthcare Providers:  IncredibleEmployment.be  This test is no t yet approved or cleared by the [place] FDA and  has been authorized for detection and/or diagnosis of SARS-CoV-2 by FDA under an Emergency Use Authorization (EUA). This EUA will remain  in effect (meaning this test can be used) for the duration of the COVID-19 declaration under Section 564(b)(1) of the Act, 21 U.S.C.section 360bbb-3(b)(1), unless the authorization is terminated  or revoked sooner.       Influenza A by PCR NEGATIVE NEGATIVE Final   Influenza B by PCR NEGATIVE NEGATIVE Final    Comment: (NOTE) The Xpert Xpress SARS-CoV-2/FLU/RSV plus assay is intended as an aid in the diagnosis of influenza from Nasopharyngeal swab specimens and should not be used as a sole basis for treatment. Nasal washings and aspirates are unacceptable for Xpert Xpress SARS-CoV-2/FLU/RSV testing.  Fact Sheet for Patients: EntrepreneurPulse.com.au  Fact Sheet for Healthcare Providers: IncredibleEmployment.be  This test is not yet approved or cleared by the [place] FDA and has been authorized for detection and/or diagnosis of SARS-CoV-2 by FDA under an Emergency Use Authorization (EUA). This EUA will remain in effect (meaning this test can be used) for the duration of the COVID-19 declaration under Section 564(b)(1) of the Act, 21 U.S.C. section 360bbb-3(b)(1), unless the authorization is terminated or revoked.  Performed at [facility], [place] [address] [place], [place] [address]   Urine Culture     Status: Abnormal   Collection Time: 04/22/22 12:31 AM   Specimen: In/Out Cath Urine  Result Value Ref Range Status   Specimen Description   Final     IN/OUT CATH URINE Performed at [place] [address] [place], [place] [address]    Special Requests   Final    NONE Performed at [facility], [place] [address], [place], [place] [address]    Culture MULTIPLE SPECIES PRESENT, SUGGEST RECOLLECTION (A)  Final   Report Status 04/23/2022 FINAL  Final  Urine Culture  Status: Abnormal   Collection Time: 04/23/22  2:29 PM   Specimen: Urine, Clean Catch  Result Value Ref Range Status   Specimen Description   Final    URINE, CLEAN CATCH Performed at [facility], [place] [address], [place], [place] [address]    Special Requests   Final    NONE Performed at [facility], [place] [address], [place], [place] [address]    Culture (A)  Final    <10,000 COLONIES/mL INSIGNIFICANT GROWTH Performed at [place] [address] [place], [place] [address]    Report Status 04/24/2022 FINAL  Final  Culture, blood (Routine X 2) w Reflex to ID Panel     Status: None (Preliminary result)   Collection Time: 04/24/22  3:36 PM   Specimen: BLOOD  Result Value Ref Range Status   Specimen Description   Final    BLOOD BLOOD LEFT HAND Performed at [place] [address] [place], [place] [address]    Special Requests   Final    BOTTLES DRAWN AEROBIC ONLY Blood Culture adequate volume Performed at [place] [address] [place], [place] [address]    Culture   Final    NO GROWTH 2 DAYS Performed at [place] [address] [place], [place] [address]    Report Status PENDING  Incomplete  Culture, blood (Routine X 2) w Reflex to ID Panel     Status: None (Preliminary result)   Collection Time: 04/24/22  3:36 PM   Specimen: BLOOD  Result Value Ref Range Status   Specimen Description   Final    BLOOD RIGHT ANTECUBITAL Performed at [place] [address] [place], [place]  [address]    Special Requests   Final    BOTTLES DRAWN AEROBIC ONLY Blood Culture adequate volume Performed at [place] [address], [place], [place] [address]    Culture   Final    NO GROWTH 2 DAYS Performed at [place] [address], [place], [place] [address]    Report Status PENDING  Incomplete     Labs: BNP (last 3 results) Recent Labs    04/21/22 2220  BNP 035.4*   Basic Metabolic Panel: Recent Labs  Lab 04/21/22 1924 04/21/22 2139 04/22/22 0500 04/23/22 0346 04/24/22 0407 04/25/22 0437 04/26/22 0425  NA 139  --  137  --  139 139 142  K 3.6  --  3.7  --  3.6 3.5 3.8  CL 100  --  101  --  104 105 105  CO2 30  --  27  --  '27 27 28  '$ GLUCOSE 133*  --  113*  --  100* 99 109*  BUN 12  --  14  --  '21 20 20  '$ CREATININE 1.39*   < > 1.29* 1.67* 1.62* 1.38* 1.40*  CALCIUM 8.7*  --  7.8*  --  7.6* 7.9* 8.4*   < > = values in this interval not displayed.   Liver Function Tests: Recent Labs  Lab 04/21/22 1924 04/22/22 0500  AST 19 51*  ALT 13 18  ALKPHOS 50 44  BILITOT 0.5 0.6  PROT 7.2 5.9*  ALBUMIN 3.0* 2.5*   No results for input(s): "LIPASE", "AMYLASE" in the last 168 hours. No results for input(s): "AMMONIA" in the last 168 hours. CBC: Recent Labs  Lab 04/21/22 1924 04/21/22 2139 04/22/22 0500 04/24/22 0407  04/25/22 0437 04/26/22 0425  WBC 17.3* 16.0* 16.3* 10.0 8.8 8.4  NEUTROABS 15.0*  --   --  7.5  --   --   HGB 10.2* 8.8* 8.7* 8.1* 8.3* 8.3*  HCT 31.6* 27.9* 27.4* 25.5* 27.1* 26.2*  MCV 93.2 93.3 93.2 93.4 95.1 93.9  PLT 261 208 188 195 206 236   Cardiac Enzymes: No results for input(s): "CKTOTAL", "CKMB", "CKMBINDEX", "TROPONINI" in the last 168 hours. BNP: Invalid input(s): "POCBNP" CBG: Recent Labs  Lab 04/22/22 0736 04/23/22 0832 04/24/22 0855 04/25/22 0820 04/26/22 0719  GLUCAP 116* 100* 101* 93 103*   D-Dimer No results for input(s): "DDIMER" in the last 72 hours. Hgb A1c No results for  input(s): "HGBA1C" in the last 72 hours. Lipid Profile No results for input(s): "CHOL", "HDL", "LDLCALC", "TRIG", "CHOLHDL", "LDLDIRECT" in the last 72 hours. Thyroid function studies No results for input(s): "TSH", "T4TOTAL", "T3FREE", "THYROIDAB" in the last 72 hours.  Invalid input(s): "FREET3" Anemia work up No results for input(s): "VITAMINB12", "FOLATE", "FERRITIN", "TIBC", "IRON", "RETICCTPCT" in the last 72 hours. Urinalysis    Component Value Date/Time   COLORURINE YELLOW 04/22/2022 0031   APPEARANCEUR HAZY (A) 04/22/2022 0031   LABSPEC >1.030 (H) 04/22/2022 0031   PHURINE 5.0 04/22/2022 0031   GLUCOSEU NEGATIVE 04/22/2022 0031   HGBUR MODERATE (A) 04/22/2022 0031   BILIRUBINUR NEGATIVE 04/22/2022 0031   KETONESUR NEGATIVE 04/22/2022 0031   PROTEINUR 30 (A) 04/22/2022 0031   NITRITE NEGATIVE 04/22/2022 0031   LEUKOCYTESUR LARGE (A) 04/22/2022 0031   Sepsis Labs Recent Labs  Lab 04/22/22 0500 04/24/22 0407 04/25/22 0437 04/26/22 0425  WBC 16.3* 10.0 8.8 8.4   Microbiology Recent Results (from the past 240 hour(s))  Blood Culture (routine x 2)     Status: Abnormal   Collection Time: 04/21/22  7:05 PM   Specimen: BLOOD LEFT FOREARM  Result Value Ref Range Status   Specimen Description   Final    BLOOD LEFT FOREARM Performed at [facility], [place] [address], [place], [place] [address]    Special Requests   Final    BOTTLES DRAWN AEROBIC AND ANAEROBIC Blood Culture adequate volume Performed at [place] [address], [place], [place] [address]    Culture  Setup Time   Final    GRAM POSITIVE COCCI IN CLUSTERS IN BOTH AEROBIC AND ANAEROBIC BOTTLES CRITICAL RESULT CALLED TO, READ BACK BY AND VERIFIED WITH: PHARMD M.STEWYNE AT 1850 ON 04/22/2022 BY T.SAAD.    Culture (A)  Final    STAPHYLOCOCCUS CAPITIS THE SIGNIFICANCE OF ISOLATING THIS ORGANISM FROM A SINGLE SET OF BLOOD CULTURES WHEN MULTIPLE SETS ARE DRAWN IS UNCERTAIN. PLEASE  NOTIFY THE MICROBIOLOGY DEPARTMENT WITHIN ONE WEEK IF SPECIATION AND SENSITIVITIES ARE REQUIRED. STAPHYLOCOCCUS HOMINIS STAPHYLOCOCCUS EPIDERMIDIS SUSCEPTIBILITIES PERFORMED ON PREVIOUS CULTURE WITHIN THE LAST 5 DAYS. Performed at [facility], [place] [address] [place], [place] [address]    Report Status 04/26/2022 FINAL  Final  Blood Culture ID Panel (Reflexed)     Status: Abnormal   Collection Time: 04/21/22  7:05 PM  Result Value Ref Range Status   Enterococcus faecalis NOT DETECTED NOT DETECTED Final   Enterococcus Faecium NOT DETECTED NOT DETECTED Final   Listeria monocytogenes NOT DETECTED NOT DETECTED Final   Staphylococcus species DETECTED (A) NOT DETECTED Final    Comment: CRITICAL RESULT CALLED TO, READ BACK BY AND VERIFIED WITH: PHARMD M.STEWYNE AT 1850 ON 04/22/2022 BY T.SAAD.    Staphylococcus aureus (BCID)  NOT DETECTED NOT DETECTED Final   Staphylococcus epidermidis DETECTED (A) NOT DETECTED Final    Comment: Methicillin (oxacillin) resistant coagulase negative staphylococcus. Possible blood culture contaminant (unless isolated from more than one blood culture draw or clinical case suggests pathogenicity). No antibiotic treatment is indicated for blood  culture contaminants. CRITICAL RESULT CALLED TO, READ BACK BY AND VERIFIED WITH: PHARMD M.STEWYNE AT 1850 ON 04/22/2022 BY T.SAAD.    Staphylococcus lugdunensis NOT DETECTED NOT DETECTED Final   Streptococcus species NOT DETECTED NOT DETECTED Final   Streptococcus agalactiae NOT DETECTED NOT DETECTED Final   Streptococcus pneumoniae NOT DETECTED NOT DETECTED Final   Streptococcus pyogenes NOT DETECTED NOT DETECTED Final   A.calcoaceticus-baumannii NOT DETECTED NOT DETECTED Final   Bacteroides fragilis NOT DETECTED NOT DETECTED Final   Enterobacterales NOT DETECTED NOT DETECTED Final   Enterobacter cloacae complex NOT DETECTED NOT DETECTED Final   Escherichia coli NOT DETECTED NOT DETECTED Final   Klebsiella  aerogenes NOT DETECTED NOT DETECTED Final   Klebsiella oxytoca NOT DETECTED NOT DETECTED Final   Klebsiella pneumoniae NOT DETECTED NOT DETECTED Final   Proteus species NOT DETECTED NOT DETECTED Final   Salmonella species NOT DETECTED NOT DETECTED Final   Serratia marcescens NOT DETECTED NOT DETECTED Final   Haemophilus influenzae NOT DETECTED NOT DETECTED Final   Neisseria meningitidis NOT DETECTED NOT DETECTED Final   Pseudomonas aeruginosa NOT DETECTED NOT DETECTED Final   Stenotrophomonas maltophilia NOT DETECTED NOT DETECTED Final   Candida albicans NOT DETECTED NOT DETECTED Final   Candida auris NOT DETECTED NOT DETECTED Final   Candida glabrata NOT DETECTED NOT DETECTED Final   Candida krusei NOT DETECTED NOT DETECTED Final   Candida parapsilosis NOT DETECTED NOT DETECTED Final   Candida tropicalis NOT DETECTED NOT DETECTED Final   Cryptococcus neoformans/gattii NOT DETECTED NOT DETECTED Final   Methicillin resistance mecA/C DETECTED (A) NOT DETECTED Final    Comment: CRITICAL RESULT CALLED TO, READ BACK BY AND VERIFIED WITH: PHARMD M.STEWYNE AT 1850 ON 04/22/2022 BY T.SAAD. Performed at [facility], [place] [address] [place], [place] [address]   Blood Culture (routine x 2)     Status: Abnormal   Collection Time: 04/21/22  7:10 PM   Specimen: BLOOD RIGHT FOREARM  Result Value Ref Range Status   Specimen Description   Final    BLOOD RIGHT FOREARM Performed at [facility], [place] [address], [place], [place] [address]    Special Requests   Final    BOTTLES DRAWN AEROBIC ONLY Blood Culture adequate volume Performed at [place] [address], [place], [place] [address]    Culture  Setup Time   Final    GRAM POSITIVE COCCI IN CLUSTERS AEROBIC BOTTLE ONLY Performed at [facility], [place] [address] [place],  [address]    Culture (A)  Final    STAPHYLOCOCCUS EPIDERMIDIS STAPHYLOCOCCUS HOMINIS    Report Status 04/26/2022 FINAL   Final   Organism ID, Bacteria STAPHYLOCOCCUS EPIDERMIDIS  Final   Organism ID, Bacteria STAPHYLOCOCCUS HOMINIS  Final      Susceptibility   Staphylococcus epidermidis - MIC*    CIPROFLOXACIN >=8 RESISTANT Resistant     ERYTHROMYCIN >=8 RESISTANT Resistant     GENTAMICIN <=0.5 SENSITIVE Sensitive     OXACILLIN >=4 RESISTANT Resistant     TETRACYCLINE 2 SENSITIVE Sensitive     VANCOMYCIN <=0.5 SENSITIVE Sensitive     TRIMETH/SULFA 80 RESISTANT Resistant     CLINDAMYCIN >=8 RESISTANT  Resistant     RIFAMPIN <=0.5 SENSITIVE Sensitive     Inducible Clindamycin NEGATIVE Sensitive     * STAPHYLOCOCCUS EPIDERMIDIS   Staphylococcus hominis - MIC*    CIPROFLOXACIN >=8 RESISTANT Resistant     ERYTHROMYCIN 0.5 SENSITIVE Sensitive     GENTAMICIN <=0.5 SENSITIVE Sensitive     OXACILLIN RESISTANT Resistant     TETRACYCLINE >=16 RESISTANT Resistant     VANCOMYCIN <=0.5 SENSITIVE Sensitive     TRIMETH/SULFA 20 SENSITIVE Sensitive     CLINDAMYCIN <=0.25 SENSITIVE Sensitive     RIFAMPIN <=0.5 SENSITIVE Sensitive     Inducible Clindamycin NEGATIVE Sensitive     * STAPHYLOCOCCUS HOMINIS  Resp Panel by RT-PCR (Flu A&B, Covid) Anterior Nasal Swab     Status: None   Collection Time: 04/21/22  7:37 PM   Specimen: Anterior Nasal Swab  Result Value Ref Range Status   SARS Coronavirus 2 by RT PCR NEGATIVE NEGATIVE Final    Comment: (NOTE) SARS-CoV-2 target nucleic acids are NOT DETECTED.  The SARS-CoV-2 RNA is generally detectable in upper respiratory specimens during the acute phase of infection. The lowest concentration of SARS-CoV-2 viral copies this assay can detect is 138 copies/mL. A negative result does not preclude SARS-Cov-2 infection and should not be used as the sole basis for treatment or other patient management decisions. A negative result may occur with  improper specimen collection/handling, submission of specimen other than nasopharyngeal swab, presence of viral mutation(s) within  the areas targeted by this assay, and inadequate number of viral copies(<138 copies/mL). A negative result must be combined with clinical observations, patient history, and epidemiological information. The expected result is Negative.  Fact Sheet for Patients:  EntrepreneurPulse.com.au  Fact Sheet for Healthcare Providers:  IncredibleEmployment.be  This test is no t yet approved or cleared by the [place] FDA and  has been authorized for detection and/or diagnosis of SARS-CoV-2 by FDA under an Emergency Use Authorization (EUA). This EUA will remain  in effect (meaning this test can be used) for the duration of the COVID-19 declaration under Section 564(b)(1) of the Act, 21 U.S.C.section 360bbb-3(b)(1), unless the authorization is terminated  or revoked sooner.       Influenza A by PCR NEGATIVE NEGATIVE Final   Influenza B by PCR NEGATIVE NEGATIVE Final    Comment: (NOTE) The Xpert Xpress SARS-CoV-2/FLU/RSV plus assay is intended as an aid in the diagnosis of influenza from Nasopharyngeal swab specimens and should not be used as a sole basis for treatment. Nasal washings and aspirates are unacceptable for Xpert Xpress SARS-CoV-2/FLU/RSV testing.  Fact Sheet for Patients: EntrepreneurPulse.com.au  Fact Sheet for Healthcare Providers: IncredibleEmployment.be  This test is not yet approved or cleared by the [place] FDA and has been authorized for detection and/or diagnosis of SARS-CoV-2 by FDA under an Emergency Use Authorization (EUA). This EUA will remain in effect (meaning this test can be used) for the duration of the COVID-19 declaration under Section 564(b)(1) of the Act, 21 U.S.C. section 360bbb-3(b)(1), unless the authorization is terminated or revoked.  Performed at [facility], [place] [address], [place], [place] [address]   Urine Culture     Status: Abnormal    Collection Time: 04/22/22 12:31 AM   Specimen: In/Out Cath Urine  Result Value Ref Range Status   Specimen Description   Final    IN/OUT CATH URINE Performed at [place] [address], [place], [place] [address]    Special Requests   Final  NONE Performed at [facility], [place] [address] [place], [place] [address]    Culture MULTIPLE SPECIES PRESENT, SUGGEST RECOLLECTION (A)  Final   Report Status 04/23/2022 FINAL  Final  Urine Culture     Status: Abnormal   Collection Time: 04/23/22  2:29 PM   Specimen: Urine, Clean Catch  Result Value Ref Range Status   Specimen Description   Final    URINE, CLEAN CATCH Performed at [facility], [place] [address], [place], [place] [address]    Special Requests   Final    NONE Performed at [facility], [place] [address] [place], [place] [address]    Culture (A)  Final    <10,000 COLONIES/mL INSIGNIFICANT GROWTH Performed at [place] [address] [place], [place] [address]    Report Status 04/24/2022 FINAL  Final  Culture, blood (Routine X 2) w Reflex to ID Panel     Status: None (Preliminary result)   Collection Time: 04/24/22  3:36 PM   Specimen: BLOOD  Result Value Ref Range Status   Specimen Description   Final    BLOOD BLOOD LEFT HAND Performed at [place] [address] [place], [place] [address]    Special Requests   Final    BOTTLES DRAWN AEROBIC ONLY Blood Culture adequate volume Performed at [place] [address], [place], [place] [address]    Culture   Final    NO GROWTH 2 DAYS Performed at [place] [address], [place], [place] [address]    Report Status PENDING  Incomplete  Culture, blood (Routine X 2) w Reflex to ID Panel     Status: None (Preliminary result)   Collection Time: 04/24/22  3:36 PM   Specimen: BLOOD  Result Value Ref Range Status   Specimen  Description   Final    BLOOD RIGHT ANTECUBITAL Performed at [place] [address], [place], [place] [address]    Special Requests   Final    BOTTLES DRAWN AEROBIC ONLY Blood Culture adequate volume Performed at [place] [address] [place], [place] [address]    Culture   Final    NO GROWTH 2 DAYS Performed at [place] [address] [place],  [address]    Report Status PENDING  Incomplete     Time coordinating discharge: Over 30 minutes  SIGNED:   Darliss Cheney, MD  [facility] 04/26/2022, 12:39 PM *Please note that this is a verbal dictation therefore any spelling or grammatical errors are due to the "[employer] [employer]" system interpretation. If 7PM-7AM, please contact night-coverage [email or web address]

## 2022-04-26 NOTE — Telephone Encounter (Signed)
Pharmacy Patient Advocate Encounter  Insurance verification completed.    The patient is insured through [facility] Part D   The patient is currently admitted and ran test claims for the following: linezolid.  Copays and coinsurance results were relayed to Inpatient clinical team.

## 2022-04-26 NOTE — Plan of Care (Signed)

## 2022-04-26 NOTE — TOC Benefit Eligibility Note (Signed)
Patient Teacher, English as a foreign language completed.    The patient is currently admitted and upon discharge could be taking linezolid (Zyvox) 600 mg tablets.  The current 9 day co-pay is, $10.00.   The patient is insured through [place], [place] Patient Advocate Specialist [place] Patient Advocate Team Direct Number: [phone]  Fax: [phone]

## 2022-04-26 NOTE — Progress Notes (Signed)
Pt. Discharged via wheelchair accompanied by husband. Pt. Is alert and oriented, no distress. AVS instruction and education discussed with patient and verbalized understanding. All personal belongings arewith the patient.

## 2022-04-26 NOTE — TOC Transition Note (Signed)
Transition of Care [facility]) - CM/SW Discharge Note   Patient Details  Name: GLENICE CICCONE MRN: [id number] Date of Birth: [birth year]  [facility] CM/SW Contact:  Joaquin Courts, RN Phone Number: 04/26/2022, 1:01 PM   Clinical Narrative:    CM spoke with patient regarding recommendations for HHPT/OT services.  Patient is in agreement.  Referral given to [facility] who accepts patient for HHPT/OT.    Final next level of care: [place] Barriers to Discharge: No Barriers Identified   Patient Goals and CMS Choice Patient states their goals for this hospitalization and ongoing recovery are:: to get better CMS Medicare.gov Compare Post Acute Care list provided to:: Patient Choice offered to / list presented to : Patient  Discharge Placement                       Discharge Plan and Services                          HH Arranged: PT, OT [facility] Agency: [place] Date [place]: 04/26/22 Time [place]: 1301 Representative spoke with at [place]: [place] ([place]) Interventions     Readmission Risk Interventions    04/25/2022   12:11 PM  Readmission Risk Prevention Plan  Transportation Screening Complete  HRI or [place] Complete  Social Work Consult for [place] Choice Planning/Counseling Complete  Palliative Care Screening Not Applicable  Medication Review Press photographer) Complete

## 2022-04-28 ENCOUNTER — Other Ambulatory Visit: Payer: Self-pay | Admitting: Family Medicine

## 2022-04-28 DIAGNOSIS — A411 Sepsis due to other specified staphylococcus: Secondary | ICD-10-CM | POA: Diagnosis not present

## 2022-04-28 DIAGNOSIS — I2699 Other pulmonary embolism without acute cor pulmonale: Secondary | ICD-10-CM | POA: Diagnosis not present

## 2022-04-28 DIAGNOSIS — Z1231 Encounter for screening mammogram for malignant neoplasm of breast: Secondary | ICD-10-CM

## 2022-04-28 DIAGNOSIS — N10 Acute tubulo-interstitial nephritis: Secondary | ICD-10-CM | POA: Diagnosis not present

## 2022-04-28 DIAGNOSIS — I5032 Chronic diastolic (congestive) heart failure: Secondary | ICD-10-CM | POA: Diagnosis not present

## 2022-04-28 DIAGNOSIS — I2510 Atherosclerotic heart disease of native coronary artery without angina pectoris: Secondary | ICD-10-CM | POA: Diagnosis not present

## 2022-04-28 DIAGNOSIS — T83593A Infection and inflammatory reaction due to other urinary stents, initial encounter: Secondary | ICD-10-CM | POA: Diagnosis not present

## 2022-04-28 DIAGNOSIS — I959 Hypotension, unspecified: Secondary | ICD-10-CM | POA: Diagnosis not present

## 2022-04-28 DIAGNOSIS — I130 Hypertensive heart and chronic kidney disease with heart failure and stage 1 through stage 4 chronic kidney disease, or unspecified chronic kidney disease: Secondary | ICD-10-CM | POA: Diagnosis not present

## 2022-04-28 DIAGNOSIS — N184 Chronic kidney disease, stage 4 (severe): Secondary | ICD-10-CM | POA: Diagnosis not present

## 2022-04-29 LAB — CULTURE, BLOOD (ROUTINE X 2)
Culture: NO GROWTH
Culture: NO GROWTH
Special Requests: ADEQUATE
Special Requests: ADEQUATE

## 2022-05-02 DIAGNOSIS — N12 Tubulo-interstitial nephritis, not specified as acute or chronic: Secondary | ICD-10-CM | POA: Diagnosis not present

## 2022-05-02 DIAGNOSIS — Z79899 Other long term (current) drug therapy: Secondary | ICD-10-CM | POA: Diagnosis not present

## 2022-05-02 DIAGNOSIS — R195 Other fecal abnormalities: Secondary | ICD-10-CM | POA: Diagnosis not present

## 2022-05-02 DIAGNOSIS — I2693 Single subsegmental pulmonary embolism without acute cor pulmonale: Secondary | ICD-10-CM | POA: Diagnosis not present

## 2022-05-10 DIAGNOSIS — I2699 Other pulmonary embolism without acute cor pulmonale: Secondary | ICD-10-CM | POA: Diagnosis not present

## 2022-05-10 DIAGNOSIS — I110 Hypertensive heart disease with heart failure: Secondary | ICD-10-CM | POA: Diagnosis not present

## 2022-05-10 DIAGNOSIS — E559 Vitamin D deficiency, unspecified: Secondary | ICD-10-CM | POA: Diagnosis not present

## 2022-05-10 DIAGNOSIS — I509 Heart failure, unspecified: Secondary | ICD-10-CM | POA: Diagnosis not present

## 2022-05-10 DIAGNOSIS — R4182 Altered mental status, unspecified: Secondary | ICD-10-CM | POA: Diagnosis not present

## 2022-05-10 DIAGNOSIS — F329 Major depressive disorder, single episode, unspecified: Secondary | ICD-10-CM | POA: Diagnosis not present

## 2022-05-10 DIAGNOSIS — I959 Hypotension, unspecified: Secondary | ICD-10-CM | POA: Diagnosis not present

## 2022-05-10 DIAGNOSIS — G8929 Other chronic pain: Secondary | ICD-10-CM | POA: Diagnosis not present

## 2022-05-10 DIAGNOSIS — D649 Anemia, unspecified: Secondary | ICD-10-CM | POA: Diagnosis not present

## 2022-05-10 DIAGNOSIS — I739 Peripheral vascular disease, unspecified: Secondary | ICD-10-CM | POA: Diagnosis not present

## 2022-05-10 DIAGNOSIS — E785 Hyperlipidemia, unspecified: Secondary | ICD-10-CM | POA: Diagnosis not present

## 2022-05-10 DIAGNOSIS — F419 Anxiety disorder, unspecified: Secondary | ICD-10-CM | POA: Diagnosis not present

## 2022-05-11 ENCOUNTER — Ambulatory Visit
Admission: RE | Admit: 2022-05-11 | Discharge: 2022-05-11 | Disposition: A | Discharge status: Home / Self Care | Payer: Medicare PPO | Source: Ambulatory Visit | Attending: Family Medicine | Admitting: Family Medicine

## 2022-05-11 ENCOUNTER — Other Ambulatory Visit: Payer: Self-pay | Admitting: Family Medicine

## 2022-05-11 DIAGNOSIS — R4182 Altered mental status, unspecified: Secondary | ICD-10-CM

## 2022-05-11 DIAGNOSIS — R42 Dizziness and giddiness: Secondary | ICD-10-CM | POA: Diagnosis not present

## 2022-05-11 DIAGNOSIS — W19XXXA Unspecified fall, initial encounter: Secondary | ICD-10-CM

## 2022-05-15 ENCOUNTER — Encounter (HOSPITAL_BASED_OUTPATIENT_CLINIC_OR_DEPARTMENT_OTHER): Payer: Self-pay | Admitting: Obstetrics and Gynecology

## 2022-05-15 ENCOUNTER — Observation Stay (HOSPITAL_BASED_OUTPATIENT_CLINIC_OR_DEPARTMENT_OTHER)
Admission: EM | Admit: 2022-05-15 | Discharge: 2022-05-16 | Disposition: A | Discharge status: Home / Self Care | Payer: Medicare PPO | Attending: Student | Admitting: Student

## 2022-05-15 ENCOUNTER — Other Ambulatory Visit: Payer: Self-pay

## 2022-05-15 ENCOUNTER — Encounter (HOSPITAL_COMMUNITY): Payer: Self-pay

## 2022-05-15 DIAGNOSIS — Z87891 Personal history of nicotine dependence: Secondary | ICD-10-CM | POA: Diagnosis not present

## 2022-05-15 DIAGNOSIS — Z79899 Other long term (current) drug therapy: Secondary | ICD-10-CM | POA: Insufficient documentation

## 2022-05-15 DIAGNOSIS — N3001 Acute cystitis with hematuria: Secondary | ICD-10-CM | POA: Diagnosis not present

## 2022-05-15 DIAGNOSIS — I2510 Atherosclerotic heart disease of native coronary artery without angina pectoris: Secondary | ICD-10-CM | POA: Insufficient documentation

## 2022-05-15 DIAGNOSIS — Z7901 Long term (current) use of anticoagulants: Secondary | ICD-10-CM | POA: Diagnosis not present

## 2022-05-15 DIAGNOSIS — Z8616 Personal history of COVID-19: Secondary | ICD-10-CM | POA: Diagnosis not present

## 2022-05-15 DIAGNOSIS — C689 Malignant neoplasm of urinary organ, unspecified: Secondary | ICD-10-CM | POA: Diagnosis present

## 2022-05-15 DIAGNOSIS — I5042 Chronic combined systolic (congestive) and diastolic (congestive) heart failure: Secondary | ICD-10-CM | POA: Diagnosis not present

## 2022-05-15 DIAGNOSIS — E782 Mixed hyperlipidemia: Secondary | ICD-10-CM | POA: Diagnosis not present

## 2022-05-15 DIAGNOSIS — Z8559 Personal history of malignant neoplasm of other urinary tract organ: Secondary | ICD-10-CM | POA: Diagnosis not present

## 2022-05-15 DIAGNOSIS — N183 Chronic kidney disease, stage 3 unspecified: Secondary | ICD-10-CM | POA: Diagnosis not present

## 2022-05-15 DIAGNOSIS — I130 Hypertensive heart and chronic kidney disease with heart failure and stage 1 through stage 4 chronic kidney disease, or unspecified chronic kidney disease: Secondary | ICD-10-CM | POA: Insufficient documentation

## 2022-05-15 DIAGNOSIS — I10 Essential (primary) hypertension: Secondary | ICD-10-CM | POA: Diagnosis present

## 2022-05-15 DIAGNOSIS — R799 Abnormal finding of blood chemistry, unspecified: Secondary | ICD-10-CM | POA: Diagnosis present

## 2022-05-15 DIAGNOSIS — Z7982 Long term (current) use of aspirin: Secondary | ICD-10-CM | POA: Diagnosis not present

## 2022-05-15 DIAGNOSIS — D649 Anemia, unspecified: Secondary | ICD-10-CM | POA: Diagnosis not present

## 2022-05-15 LAB — CBC
HCT: 24.9 % — ABNORMAL LOW (ref 36.0–46.0)
Hemoglobin: 7.7 g/dL — ABNORMAL LOW (ref 12.0–15.0)
MCH: 29.6 pg (ref 26.0–34.0)
MCHC: 30.9 g/dL (ref 30.0–36.0)
MCV: 95.8 fL (ref 80.0–100.0)
Platelets: 184 10*3/uL (ref 150–400)
RBC: 2.6 MIL/uL — ABNORMAL LOW (ref 3.87–5.11)
RDW: 20.1 % — ABNORMAL HIGH (ref 11.5–15.5)
WBC: 6.3 10*3/uL (ref 4.0–10.5)
nRBC: 1.3 % — ABNORMAL HIGH (ref 0.0–0.2)

## 2022-05-15 LAB — COMPREHENSIVE METABOLIC PANEL
ALT: 18 U/L (ref 0–44)
AST: 19 U/L (ref 15–41)
Albumin: 3.9 g/dL (ref 3.5–5.0)
Alkaline Phosphatase: 48 U/L (ref 38–126)
Anion gap: 10 (ref 5–15)
BUN: 12 mg/dL (ref 8–23)
CO2: 29 mmol/L (ref 22–32)
Calcium: 9.6 mg/dL (ref 8.9–10.3)
Chloride: 102 mmol/L (ref 98–111)
Creatinine, Ser: 1.44 mg/dL — ABNORMAL HIGH (ref 0.44–1.00)
GFR, Estimated: 38 mL/min — ABNORMAL LOW (ref >60)
Glucose, Bld: 100 mg/dL — ABNORMAL HIGH (ref 70–99)
Potassium: 3.4 mmol/L — ABNORMAL LOW (ref 3.5–5.1)
Sodium: 141 mmol/L (ref 135–145)
Total Bilirubin: 0.4 mg/dL (ref 0.3–1.2)
Total Protein: 7.2 g/dL (ref 6.5–8.1)

## 2022-05-15 LAB — HEPATIC FUNCTION PANEL
ALT: 17 U/L (ref 0–44)
AST: 21 U/L (ref 15–41)
Albumin: 3.9 g/dL (ref 3.5–5.0)
Alkaline Phosphatase: 49 U/L (ref 38–126)
Bilirubin, Direct: 0.1 mg/dL (ref 0.0–0.2)
Indirect Bilirubin: 0.2 mg/dL — ABNORMAL LOW (ref 0.3–0.9)
Total Bilirubin: 0.3 mg/dL (ref 0.3–1.2)
Total Protein: 7.2 g/dL (ref 6.5–8.1)

## 2022-05-15 LAB — URINALYSIS, ROUTINE W REFLEX MICROSCOPIC
Bilirubin Urine: NEGATIVE
Glucose, UA: NEGATIVE mg/dL
Ketones, ur: NEGATIVE mg/dL
Nitrite: NEGATIVE
Protein, ur: 30 mg/dL — AB
RBC / HPF: >50 RBC/hpf — ABNORMAL HIGH (ref 0–5)
Specific Gravity, Urine: 1.012 (ref 1.005–1.030)
WBC, UA: >50 WBC/hpf — ABNORMAL HIGH (ref 0–5)
pH: 6 (ref 5.0–8.0)

## 2022-05-15 LAB — CK: Total CK: 26 U/L — ABNORMAL LOW (ref 38–234)

## 2022-05-15 LAB — LIPASE, BLOOD: Lipase: 22 U/L (ref 11–51)

## 2022-05-15 LAB — OCCULT BLOOD X 1 CARD TO LAB, STOOL: Fecal Occult Bld: POSITIVE — AB

## 2022-05-15 LAB — TROPONIN I (HIGH SENSITIVITY)
Troponin I (High Sensitivity): 3 ng/L (ref <18)
Troponin I (High Sensitivity): 3 ng/L (ref <18)

## 2022-05-15 LAB — PHOSPHORUS: Phosphorus: 3.7 mg/dL (ref 2.5–4.6)

## 2022-05-15 LAB — MAGNESIUM: Magnesium: 1.8 mg/dL (ref 1.7–2.4)

## 2022-05-15 MED ORDER — SODIUM CHLORIDE 0.9 % IV SOLN
2.0000 g | Freq: Once | INTRAVENOUS | Status: AC
  Administered 2022-05-15: 2 g via INTRAVENOUS
  Filled 2022-05-15: qty 20

## 2022-05-15 MED ORDER — ACETAMINOPHEN 325 MG PO TABS: 650.0000 mg | ORAL_TABLET | Freq: Four times a day (QID) | ORAL | Status: DC | PRN

## 2022-05-15 MED ORDER — ENSURE ENLIVE PO LIQD: 237.0000 mL | Freq: Two times a day (BID) | ORAL | Status: DC

## 2022-05-15 MED ORDER — ONDANSETRON HCL 4 MG/2ML IJ SOLN: 4.0000 mg | Freq: Four times a day (QID) | INTRAMUSCULAR | Status: DC | PRN

## 2022-05-15 MED ORDER — ISOSORBIDE MONONITRATE ER 30 MG PO TB24
15.0000 mg | ORAL_TABLET | Freq: Every morning | ORAL | Status: DC
  Administered 2022-05-16: 15 mg via ORAL
  Filled 2022-05-15: qty 1

## 2022-05-15 MED ORDER — HYDROCODONE-ACETAMINOPHEN 5-325 MG PO TABS: 1.0000 | ORAL_TABLET | Freq: Four times a day (QID) | ORAL | Status: DC | PRN

## 2022-05-15 MED ORDER — ACETAMINOPHEN 650 MG RE SUPP: 650.0000 mg | Freq: Four times a day (QID) | RECTAL | Status: DC | PRN

## 2022-05-15 MED ORDER — ONDANSETRON HCL 4 MG PO TABS: 4.0000 mg | ORAL_TABLET | Freq: Four times a day (QID) | ORAL | Status: DC | PRN

## 2022-05-15 MED ORDER — FUROSEMIDE 10 MG/ML IJ SOLN
20.0000 mg | Freq: Once | INTRAMUSCULAR | Status: AC
  Administered 2022-05-16: 20 mg via INTRAVENOUS
  Filled 2022-05-15: qty 2

## 2022-05-15 MED ORDER — EZETIMIBE 10 MG PO TABS
10.0000 mg | ORAL_TABLET | Freq: Every day | ORAL | Status: DC
  Administered 2022-05-16: 10 mg via ORAL
  Filled 2022-05-15: qty 1

## 2022-05-15 MED ORDER — ROSUVASTATIN CALCIUM 20 MG PO TABS
40.0000 mg | ORAL_TABLET | Freq: Every morning | ORAL | Status: DC
  Administered 2022-05-16: 40 mg via ORAL
  Filled 2022-05-15: qty 2

## 2022-05-15 MED ORDER — CARVEDILOL 6.25 MG PO TABS
6.2500 mg | ORAL_TABLET | Freq: Two times a day (BID) | ORAL | Status: DC
  Administered 2022-05-16: 6.25 mg via ORAL
  Filled 2022-05-15: qty 1

## 2022-05-15 MED ORDER — ASPIRIN 81 MG PO TBEC
81.0000 mg | DELAYED_RELEASE_TABLET | Freq: Every morning | ORAL | Status: DC
Start: 2022-05-16 — End: 2022-05-16

## 2022-05-15 MED ORDER — SODIUM CHLORIDE 0.9% IV SOLUTION: Freq: Once | INTRAVENOUS | Status: AC

## 2022-05-15 NOTE — Assessment & Plan Note (Signed)
Observation bed. Transfuse with 2 units PRBC. Check iron and TIBC prior to transfusion. Pt states she takes iron tablets every day due to her chronic anemia. Has had black stools but these stools are hard. Not sticky/soft like melena.

## 2022-05-15 NOTE — ED Triage Notes (Signed)
Patient reports to the ER from PCP. Patient was told her Hgb was 6.7 and to come here for blood transfusion.

## 2022-05-15 NOTE — Assessment & Plan Note (Signed)
Stable. Due to low BP, pt told to stop hydralazine. Restart coreg and imdur after PRBC transfusion.

## 2022-05-15 NOTE — ED Provider Notes (Signed)
[facility] Provider Note   CSN: [id number] Arrival date & time: 05/15/22  1603     History  Chief Complaint  Patient presents with   Abnormal Lab    [name] is a 75 y.o. female with history of essential hypertension, hypokalemia, chronic kidney disease, CHF, CAD who presents to the emergency department for evaluation of a hemoglobin of 6.7 noted by her PCP on Friday, 05/12/2022.  Patient had an appointment with her gastroenterologist today who noted the lab results and advised her to come to the emergency department for potential blood transfusion.  Patient was admitted about 1 month ago for severe left-sided pyelonephritis.  Patient has been doing well since then as far as urinary symptoms, however she states that she has been progressively more weak, tired and lightheaded when walking.  She has been getting worked up with her primary care physician and her hemoglobin has been downtrending over the last several months.  2 months ago, her hemoglobin was 11.9 and has been hovering around 8 for the last several weeks.  Patient states that she is noticed small amounts of pink blood on her tissue paper both after urinating and having a bowel movement which is why she was referred to gastroenterology.  She denies chest pain, shortness of breath, dysuria, numbness, tingling, diarrhea, abdominal pain or constipation.     Abnormal Lab      Home Medications Prior to Admission medications   Medication Sig Start Date End Date Taking? Authorizing Provider  acetaminophen (TYLENOL) 500 MG tablet Take 500-1,000 mg by mouth every 6 (six) hours as needed (pain).    [provider]  apixaban (ELIQUIS) 5 MG TABS tablet Take 2 tablets (10 mg total) by mouth 2 (two) times daily for 4 days. 04/26/22 05/04/22  Darliss Cheney, MD  apixaban (ELIQUIS) 5 MG TABS tablet Take 1 tablet (5 mg total) by mouth 2 (two) times daily. To start after loading dose. 04/29/22 05/29/22   Darliss Cheney, MD  aspirin EC 81 MG tablet Take 1 tablet (81 mg total) by mouth daily. Patient taking differently: Take 81 mg by mouth every morning. 11/03/19   Nahser, Wonda Cheng, MD  calcium carbonate (TUMS - DOSED IN MG ELEMENTAL CALCIUM) 500 MG chewable tablet Chew 1 tablet by mouth every morning. Patient not taking: Reported on 04/22/2022    [provider]  calcium carbonate (TUMS EX) 750 MG chewable tablet Chew 1 tablet by mouth every morning.    [provider]  carvedilol (COREG) 6.25 MG tablet TAKE 1 TABLET(6.25 MG) BY MOUTH TWICE DAILY WITH A MEAL Patient taking differently: Take 6.25 mg by mouth 2 (two) times daily with a meal. 11/22/21   Chandrasekhar, Mahesh A, MD  cholecalciferol (VITAMIN D3) 25 MCG (1000 UNIT) tablet Take 1,000 Units by mouth every morning.    [provider]  ezetimibe (ZETIA) 10 MG tablet Take 1 tablet (10 mg total) by mouth daily. Patient taking differently: Take 10 mg by mouth daily with lunch. 12/21/21   Chandrasekhar, Lyda Kalata A, MD  hydrALAZINE (APRESOLINE) 50 MG tablet TAKE 1 TABLET(50 MG) BY MOUTH EVERY 8 HOURS Patient taking differently: Take 50 mg by mouth 3 (three) times daily. 02/13/22   Werner Lean, MD  HYDROcodone-acetaminophen (NORCO/VICODIN) 5-325 MG tablet Take 1 tablet by mouth every 6 (six) hours as needed for pain. 02/24/22   [provider]  isosorbide mononitrate (IMDUR) 30 MG 24 hr tablet Take 0.5 tablets (15 mg total) by mouth  daily. Patient taking differently: Take 15 mg by mouth every morning. 12/13/21   Chandrasekhar, Terisa Starr, MD  oxybutynin (DITROPAN) 5 MG tablet Take 5 mg by mouth every 6 (six) hours as needed (bladder pain). 12/13/21   [provider]  oxyCODONE (ROXICODONE) 5 MG immediate release tablet Take 1 tablet (5 mg total) by mouth every 6 (six) hours as needed for severe pain. 02/06/22   Allred, Darrell K, PA-C  rosuvastatin (CRESTOR) 40 MG tablet Take 1 tablet (40 mg total) by mouth  daily. Patient taking differently: Take 40 mg by mouth every morning. 12/02/21   Rudean Haskell A, MD  sertraline (ZOLOFT) 50 MG tablet Take 50 mg by mouth every morning.    [provider]  traZODone (DESYREL) 150 MG tablet Take 150 mg by mouth at bedtime. 08/24/20   [provider]      Allergies    Erythromycin and Lotensin [benazepril hcl]    Review of Systems   Review of Systems  Constitutional:  Positive for fatigue. Negative for fever.  Gastrointestinal:  Positive for blood in stool. Negative for abdominal pain, diarrhea, nausea and vomiting.  Genitourinary:  Positive for hematuria. Negative for dysuria.  Neurological:  Positive for weakness.    Physical Exam Updated Vital Signs BP 121/64   Pulse 64   Temp 97.6 F (36.4 C)   Resp 13   SpO2 96%  Physical Exam Vitals and nursing note reviewed.  Constitutional:      General: She is not in acute distress. HENT:     Head: Atraumatic.  Eyes:     Conjunctiva/sclera: Conjunctivae normal.  Cardiovascular:     Rate and Rhythm: Normal rate and regular rhythm.     Pulses: Normal pulses.     Heart sounds: No murmur heard. Pulmonary:     Effort: Pulmonary effort is normal. No respiratory distress.     Breath sounds: Normal breath sounds.  Abdominal:     General: Abdomen is flat. There is no distension.     Palpations: Abdomen is soft.     Tenderness: There is no abdominal tenderness.  Genitourinary:    Rectum: Guaiac result positive.  Musculoskeletal:        General: Normal range of motion.     Cervical back: Normal range of motion.  Skin:    General: Skin is warm and dry.     Capillary Refill: Capillary refill takes less than 2 seconds.  Neurological:     General: No focal deficit present.     Mental Status: She is alert.  Psychiatric:        Mood and Affect: Mood normal.     ED Results / Procedures / Treatments   Labs (all labs ordered are listed, but only abnormal results are  displayed) Labs Reviewed  COMPREHENSIVE METABOLIC PANEL - Abnormal; Notable for the following components:      Result Value   Potassium 3.4 (*)    Glucose, Bld 100 (*)    Creatinine, Ser 1.44 (*)    GFR, Estimated 38 (*)    All other components within normal limits  CBC - Abnormal; Notable for the following components:   RBC 2.60 (*)    Hemoglobin 7.7 (*)    HCT 24.9 (*)    RDW 20.1 (*)    nRBC 1.3 (*)    All other components within normal limits  OCCULT BLOOD X 1 CARD TO LAB, STOOL - Abnormal; Notable for the following components:   Fecal Occult  Bld POSITIVE (*)    All other components within normal limits  URINALYSIS, ROUTINE W REFLEX MICROSCOPIC - Abnormal; Notable for the following components:   APPearance HAZY (*)    Hgb urine dipstick LARGE (*)    Protein, ur 30 (*)    Leukocytes,Ua LARGE (*)    RBC / HPF >50 (*)    WBC, UA >50 (*)    Bacteria, UA RARE (*)    All other components within normal limits  URINE CULTURE  LIPASE, BLOOD  TROPONIN I (HIGH SENSITIVITY)    EKG EKG Interpretation  Date/Time:  Monday May 15 2022 18:31:15 EDT Ventricular Rate:  64 PR Interval:  148 QRS Duration: 83 QT Interval:  420 QTC Calculation: 434 R Axis:   61 Text Interpretation: Sinus rhythm RSR' in V1 or V2, probably normal variant No significant change was found Confirmed by Ezequiel Essex [phone]) on 05/15/2022 6:47:44 PM  Radiology No results found.  Procedures Procedures    Medications Ordered in ED Medications  cefTRIAXone (ROCEPHIN) 2 g in sodium chloride 0.9 % 100 mL IVPB (has no administration in time range)    ED Course/ Medical Decision Making/ A&P                           Medical Decision Making Amount and/or Complexity of Data Reviewed Labs: ordered. ECG/medicine tests: ordered.  Risk Decision regarding hospitalization.   Social determinants of health:  Social History   Socioeconomic History   Marital status: Married    Spouse name: Not on  file   Number of children: Not on file   Years of education: Not on file   Highest education level: Not on file  Occupational History   Not on file  Tobacco Use   Smoking status: Former    Packs/day: 0.50    Years: 38.00    Total pack years: 19.00    Types: Cigarettes    Quit date: 11/26/2003    Years since quitting: 18.4   Smokeless tobacco: Never  Vaping Use   Vaping Use: Never used  Substance and Sexual Activity   Alcohol use: No    Alcohol/week: 0.0 standard drinks of alcohol   Drug use: No   Sexual activity: Yes    Birth control/protection: Post-menopausal  Other Topics Concern   Not on file  Social History Narrative   Not on file   Social Determinants of Health   Financial Resource Strain: Not on file  Food Insecurity: Not on file  Transportation Needs: Not on file  Physical Activity: Not on file  Stress: Not on file  Social Connections: Not on file  Intimate Partner Violence: Not on file     Initial impression:  This patient presents to the ED for concern of symptomatic anemia, this involves an extensive number of treatment options, and is a complaint that carries with it a high risk of complications and morbidity.   Differentials include gastrointestinal bleed, iron deficiency, thalassemia  Comorbidities affecting care:  Per HPI  Additional history obtained: Labs and records for admission 1 month ago  Lab Tests  I Ordered, reviewed, and interpreted labs and EKG.  The pertinent results include:  Hemoglobin 7.7, fecal occult positive Urinary tract infection   Cardiac Monitoring:  The patient was maintained on a cardiac monitor.  I personally viewed and interpreted the cardiac monitored which showed an underlying rhythm of: Sinus rhythm    ED Course/Re-evaluation: Patient is nontoxic-appearing.  Vitals  are normal and she is satting well on room air.  On exam, she has no abdominal tenderness to palpation.  Lung sounds normal.  Negative CVA tenderness  bilaterally.  Abdomen is soft, nondistended and nontender.  Repeat CBC shows hemoglobin of 7.7 which was reportedly increased from 6.7 3 days ago.  Fecal occult was positive and UA concerning for ongoing infection.  Ultimately, patient's hemoglobin is downtrending and she is symptomatic with symptoms of weakness, fatigue and lightheadedness.  She was started on 2 g Rocephin IV here in the emergency department for UTI.  Urine culture sent out.  Will admit for symptomatic anemia and urinary tract infection. Dr. Roel Cluck agrees to admit.  Disposition:  After consideration of the diagnostic results, physical exam, history and the patients response to treatment feel that the patent would benefit from admission.   Symptomatic anemia UTI: Plan and management as described above.   Final Clinical Impression(s) / ED Diagnoses Final diagnoses:  Symptomatic anemia  Acute cystitis with hematuria    Rx / DC Orders ED Discharge Orders     None         Rodena Piety 05/15/22 1919    Ezequiel Essex, MD 05/16/22 [phone]

## 2022-05-15 NOTE — Assessment & Plan Note (Signed)
Stable. On zetia and crestor.

## 2022-05-15 NOTE — Subjective & Objective (Addendum)
CC: anemia HPI: 76 year old female history of chronic anemia, urothelial cancer status post double-J stent, hypertension, small PE in July 2023 presents to the ER with symptomatic anemia.  Patient was told last week that she has anemia and was instructed to go to the ER.  She did not want to.  She has been feeling more tired.  Patient having fatigue.  Patient does take oral iron tablets at home.  She has chronic anemia.  She denies any sticky, tar-like stools.  Her stools are black but they have been black on iron therapy.  Stools are hard and formed.  Patient sent to [facility] by her PCP.  [facility] does not transfuse blood as they do not have a blood bank.  Patient sent to [facility] for an anemia and need for transfusion.  Of note, this could have be done as an outpatient as the patient is not having any red flag symptoms including shortness of breath, chest pain.  Pt stopped her Eliquis last week at the request of her doctor due to her anemia.

## 2022-05-15 NOTE — Assessment & Plan Note (Signed)
Stable. Euvolemic. Will give 1 dose of IV lasix in between PRBC units.

## 2022-05-15 NOTE — ED Notes (Signed)
Lab called to add troponin 

## 2022-05-15 NOTE — Assessment & Plan Note (Addendum)
Stable. Followed by urology. Pt has a chronic stent due to her urothelial cancer. Pt without any back pain or fevers, no dysuria. She is going to consistently have a dirty looking UA due to her chronic stent. Will not continue with abx as I don't think she has an active infection.

## 2022-05-15 NOTE — H&P (Addendum)
History and Physical    [name] IOE:[id number] DOB: [birth year] DOA: 05/15/2022  DOS: the patient was seen and examined on 05/15/2022  PCP: [name], MD   Patient coming from: Home  I have personally briefly reviewed patient's old medical records in [place]  CC: anemia HPI: 76 year old female history of chronic anemia, urothelial cancer status post double-J stent, hypertension, small PE in July 2023 presents to the ER with symptomatic anemia.  Patient was told last week that she has anemia and was instructed to go to the ER.  She did not want to.  She has been feeling more tired.  Patient having fatigue.  Patient does take oral iron tablets at home.  She has chronic anemia.  She denies any sticky, tar-like stools.  Her stools are black but they have been black on iron therapy.  Stools are hard and formed.  Patient sent to [facility] by her PCP.  [facility] does not transfuse blood as they do not have a blood bank.  Patient sent to [facility] for an anemia and need for transfusion.  Of note, this could have be done as an outpatient as the patient is not having any red flag symptoms including shortness of breath, chest pain.  Pt stopped her Eliquis last week at the request of her doctor due to her anemia.   ED Course: Hemoglobin 7.7  Review of Systems:  Review of Systems  Constitutional:  Positive for malaise/fatigue.  HENT: Negative.    Eyes: Negative.   Respiratory:  Negative for shortness of breath.   Cardiovascular: Negative.  Negative for chest pain.  Gastrointestinal: Negative.  Negative for blood in stool.  Genitourinary: Negative.   Musculoskeletal: Negative.   Skin: Negative.   Neurological: Negative.   Endo/Heme/Allergies: Negative.   Psychiatric/Behavioral: Negative.    All other systems reviewed and are negative.   Past Medical History:  Diagnosis Date   Anxiety    Bilateral swelling of feet 03/11/2022   bilateral feet  edema much improved per pt on 03-20-2022, bilateral foot edema resolves after propping both feet up   CAD in native artery    a. 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   CHF (congestive heart failure) ([place])    Chronic kidney disease 06/2019   acute renal insufficiency sees dr Dorina Hoyer, ckd stage 3 per pt   COVID 09/2020   tired sore throat nausea hair fell out loss of taste and smell x 10 days, all symptoms resolved except still has loss of taste and smell   Depression    GERD (gastroesophageal reflux disease)    Hyperlipidemia    Hypertension     Past Surgical History:  Procedure Laterality Date   ABDOMINAL HYSTERECTOMY     partial   bladder-partial removal     CYSTOSCOPY W/ URETERAL STENT PLACEMENT Left 11/17/2019   Procedure: CYSTOSCOPY WITH STENT REPLACEMENT;  Surgeon: Franchot Gallo, MD;  Location: [facility];  Service: Urology;  Laterality: Left;   CYSTOSCOPY W/ URETERAL STENT PLACEMENT Left 03/23/2022   Procedure: CYSTOSCOPY WITH RETROGRADE PYELOGRAM, LEFT URETERAL STENT PLACEMENT;  Surgeon: Franchot Gallo, MD;  Location: [facility];  Service: Urology;  Laterality: Left;   CYSTOSCOPY W/ URETERAL STENT REMOVAL Left 05/10/2020   Procedure: CYSTOSCOPY WITH STENT REMOVAL;  Surgeon: Franchot Gallo, MD;  Location: [facility];  Service: Urology;  Laterality: Left;   CYSTOSCOPY W/ URETERAL STENT REMOVAL Left 05/02/2021   Procedure: CYSTOSCOPY WITH STENT REMOVAL;  Surgeon: Franchot Gallo, MD;  Location: [facility];  Service: Urology;  Laterality: Left;   CYSTOSCOPY WITH RETROGRADE PYELOGRAM, URETEROSCOPY AND STENT PLACEMENT Left 06/27/2019   Procedure: CYSTOSCOPY WITH LEFT RETROGRADE PYELOGRAM, URETEROSCOPY, BIOPSY AND LEFT STENT PLACEMENT;  Surgeon: Irine Seal, MD;  Location: [facility];  Service: Urology;  Laterality: Left;   CYSTOSCOPY WITH RETROGRADE  PYELOGRAM, URETEROSCOPY AND STENT PLACEMENT Left 08/18/2019   Procedure: CYSTOSCOPY, URETEROSCOPY AND STENT EXCHANGE;  Surgeon: Franchot Gallo, MD;  Location: [facility];  Service: Urology;  Laterality: Left;  49 MINS   CYSTOSCOPY WITH RETROGRADE PYELOGRAM, URETEROSCOPY AND STENT PLACEMENT Left 11/17/2019   Procedure: CYSTOSCOPY WITH RETROGRADE PYELOGRAM, URETEROSCOPY AND STENT PLACEMENT;  Surgeon: Franchot Gallo, MD;  Location: [facility];  Service: Urology;  Laterality: Left;  31 MINS   CYSTOSCOPY WITH RETROGRADE PYELOGRAM, URETEROSCOPY AND STENT PLACEMENT Left 05/10/2020   Procedure: CYSTOSCOPY WITH RETROGRADE PYELOGRAM, URETEROSCOPY AND STENT PLACEMENT WITH URETHRAL DIALATION AND BRUSH BIOPSY;  Surgeon: Franchot Gallo, MD;  Location: [facility];  Service: Urology;  Laterality: Left;  1 HR   CYSTOSCOPY WITH RETROGRADE PYELOGRAM, URETEROSCOPY AND STENT PLACEMENT Left 09/16/2020   Procedure: CYSTOSCOPY WITH RETROGRADE PYELOGRAM, URETEROSCOPY ,  LITHOPAXY, LEFT URETERAL BRUSHING, AND STENT REPLACEMENT;  Surgeon: Franchot Gallo, MD;  Location: [facility];  Service: Urology;  Laterality: Left;   CYSTOSCOPY WITH RETROGRADE PYELOGRAM, URETEROSCOPY AND STENT PLACEMENT Left 10/24/2021   Procedure: CYSTOSCOPY WITH RETROGRADE PYELOGRAM, URETEROSCOPY, POSSIBLE URETERAL AND RENAL BIOPSIES AND STENT PLACEMENT;  Surgeon: Franchot Gallo, MD;  Location: [facility];  Service: Urology;  Laterality: Left;  1 HR   CYSTOSCOPY WITH RETROGRADE PYELOGRAM, URETEROSCOPY AND STENT PLACEMENT Left 03/09/2022   Procedure: CYSTOSCOPY WITH ANTEGRADE PYELOGRAM,  STENT REMOVAL;  Surgeon: Franchot Gallo, MD;  Location: [facility];  Service: Urology;  Laterality: Left;   CYSTOSCOPY/URETEROSCOPY/HOLMIUM LASER/STENT PLACEMENT Left 05/02/2021   Procedure: CYSTOSCOPY/URETEROSCOPY WITH BRUSH BIOPSY/ RETROGRADE PYELOGRAM/ HOLMIUM LASER/STENT REPLACEMENT;   Surgeon: Franchot Gallo, MD;  Location: [facility];  Service: Urology;  Laterality: Left;   HOLMIUM LASER APPLICATION Left 16/10/958   Procedure: HOLMIUM LASER APPLICATION;  Surgeon: Franchot Gallo, MD;  Location: [facility];  Service: Urology;  Laterality: Left;   HOLMIUM LASER APPLICATION Left 4/54/0981   Procedure: HOLMIUM LASER APPLICATION OF TUMORS;  Surgeon: Franchot Gallo, MD;  Location: [facility];  Service: Urology;  Laterality: Left;   IR NEPHROSTOMY PLACEMENT LEFT  07/06/2021   IR NEPHROSTOMY PLACEMENT LEFT  02/06/2022   kidney removed  2006   right    NEPHROSTOMY TUBE REMOVAL Left 03/23/2022   Procedure: NEPHROSTOMY TUBE REMOVAL;  Surgeon: Franchot Gallo, MD;  Location: [facility];  Service: Urology;  Laterality: Left;   RIGHT/LEFT HEART CATH AND CORONARY ANGIOGRAPHY N/A 11/06/2019   Procedure: RIGHT/LEFT HEART CATH AND CORONARY ANGIOGRAPHY;  Surgeon: Belva Crome, MD;  Location: [place] [facility];  Service: Cardiovascular;  Laterality: N/A;   THULIUM LASER TURP (TRANSURETHRAL RESECTION OF PROSTATE) Left 08/18/2019   Procedure: THULIUM LASER ABLATION OF URETERAL TUMOR;  Surgeon: Franchot Gallo, MD;  Location: [facility];  Service: Urology;  Laterality: Left;   THULIUM LASER TURP (TRANSURETHRAL RESECTION OF PROSTATE) Left 11/17/2019   Procedure: THULIUM LASER of URETERAL CANCER;  Surgeon: Franchot Gallo, MD;  Location: [facility];  Service: Urology;  Laterality: Left;     reports that she quit smoking about 18 years ago. Her smoking use included cigarettes. She has a 19.00 pack-year smoking history. She has never used smokeless tobacco. She reports that she does not drink alcohol and does not use drugs.  Allergies  Allergen Reactions   Erythromycin Diarrhea and Nausea And Vomiting   Lotensin [Benazepril Hcl] Cough    Family History  Problem Relation Age of Onset   Hypertension  Mother    Diabetes Mellitus II Sister    Hypertension Sister    Hypertension Brother     Prior to Admission medications   Medication Sig Start Date End Date Taking? Authorizing Provider  acetaminophen (TYLENOL) 500 MG tablet Take 500-1,000 mg by mouth every 6 (six) hours as needed (pain).    [provider]  apixaban (ELIQUIS) 5 MG TABS tablet Take 2 tablets (10 mg total) by mouth 2 (two) times daily for 4 days. 04/26/22 05/04/22  Darliss Cheney, MD  apixaban (ELIQUIS) 5 MG TABS tablet Take 1 tablet (5 mg total) by mouth 2 (two) times daily. To start after loading dose. 04/29/22 05/29/22  Darliss Cheney, MD  aspirin EC 81 MG tablet Take 1 tablet (81 mg total) by mouth daily. Patient taking differently: Take 81 mg by mouth every morning. 11/03/19   Nahser, Wonda Cheng, MD  calcium carbonate (TUMS - DOSED IN MG ELEMENTAL CALCIUM) 500 MG chewable tablet Chew 1 tablet by mouth every morning. Patient not taking: Reported on 04/22/2022    [provider]  calcium carbonate (TUMS EX) 750 MG chewable tablet Chew 1 tablet by mouth every morning.    [provider]  carvedilol (COREG) 6.25 MG tablet TAKE 1 TABLET(6.25 MG) BY MOUTH TWICE DAILY WITH A MEAL Patient taking differently: Take 6.25 mg by mouth 2 (two) times daily with a meal. 11/22/21   Chandrasekhar, Mahesh A, MD  cholecalciferol (VITAMIN D3) 25 MCG (1000 UNIT) tablet Take 1,000 Units by mouth every morning.    [provider]  ezetimibe (ZETIA) 10 MG tablet Take 1 tablet (10 mg total) by mouth daily. Patient taking differently: Take 10 mg by mouth daily with lunch. 12/21/21   Chandrasekhar, Lyda Kalata A, MD  hydrALAZINE (APRESOLINE) 50 MG tablet TAKE 1 TABLET(50 MG) BY MOUTH EVERY 8 HOURS Patient taking differently: Take 50 mg by mouth 3 (three) times daily. 02/13/22   Werner Lean, MD  HYDROcodone-acetaminophen (NORCO/VICODIN) 5-325 MG tablet Take 1 tablet by mouth every 6 (six) hours as needed for pain. 02/24/22    [provider]  isosorbide mononitrate (IMDUR) 30 MG 24 hr tablet Take 0.5 tablets (15 mg total) by mouth daily. Patient taking differently: Take 15 mg by mouth every morning. 12/13/21   Chandrasekhar, Terisa Starr, MD  oxybutynin (DITROPAN) 5 MG tablet Take 5 mg by mouth every 6 (six) hours as needed (bladder pain). 12/13/21   [provider]  oxyCODONE (ROXICODONE) 5 MG immediate release tablet Take 1 tablet (5 mg total) by mouth every 6 (six) hours as needed for severe pain. 02/06/22   Allred, Darrell K, PA-C  rosuvastatin (CRESTOR) 40 MG tablet Take 1 tablet (40 mg total) by mouth daily. Patient taking differently: Take 40 mg by mouth every morning. 12/02/21   Rudean Haskell A, MD  sertraline (ZOLOFT) 50 MG tablet Take 50 mg by mouth every morning.    [provider]  traZODone (DESYREL) 150 MG tablet Take 150 mg by mouth at bedtime. 08/24/20   [provider]    Physical Exam: Vitals:   05/15/22 1945 05/15/22 2035 05/15/22 2037 05/15/22 2039  BP: 130/62  136/62   Pulse: 65  69   Resp: 18  18   Temp: 97.9 F (36.6 C)  97.9 F (36.6 C)   TempSrc: Oral  Oral   SpO2: 97%  98%   Weight:    74.7 kg  Height:  '5\' 3"'$  (1.6 m)      Physical Exam Vitals and nursing note reviewed.  Constitutional:      General: She is not in acute distress.    Appearance: Normal appearance. She is normal weight. She is not ill-appearing, toxic-appearing or diaphoretic.  HENT:     Head: Normocephalic and atraumatic.     Nose: Nose normal.  Eyes:     General: No scleral icterus. Cardiovascular:     Rate and Rhythm: Normal rate and regular rhythm.     Pulses: Normal pulses.  Pulmonary:     Effort: Pulmonary effort is normal. No respiratory distress.     Breath sounds: Normal breath sounds. No wheezing or rales.  Abdominal:     General: Bowel sounds are normal. There is no distension.     Tenderness: There is no abdominal tenderness. There is no guarding or rebound.   Musculoskeletal:     Right lower leg: No edema.     Left lower leg: No edema.  Skin:    General: Skin is warm and dry.     Capillary Refill: Capillary refill takes less than 2 seconds.  Neurological:     General: No focal deficit present.     Mental Status: She is alert and oriented to person, place, and time.      Labs on Admission: I have personally reviewed following labs and imaging studies  CBC: Recent Labs  Lab 05/15/22 1617  WBC 6.3  HGB 7.7*  HCT 24.9*  MCV 95.8  PLT 846   Basic Metabolic Panel: Recent Labs  Lab 05/15/22 1617  NA 141  K 3.4*  CL 102  CO2 29  GLUCOSE 100*  BUN 12  CREATININE 1.44*  CALCIUM 9.6  MG 1.8  PHOS 3.7   GFR: Estimated Creatinine Clearance: 33.7 mL/min (A) (by C-G formula based on SCr of 1.44 mg/dL (H)). Liver Function Tests: Recent Labs  Lab 05/15/22 1617  AST 21  19  ALT 17  18  ALKPHOS 49  48  BILITOT 0.3  0.4  PROT 7.2  7.2  ALBUMIN 3.9  3.9   Recent Labs  Lab 05/15/22 1617  LIPASE 22   No results for input(s): "AMMONIA" in the last 168 hours. Coagulation Profile: No results for input(s): "INR", "PROTIME" in the last 168 hours. Cardiac Enzymes: Recent Labs  Lab 05/15/22 1617  CKTOTAL 26*  TROPONINIHS 3   BNP (last 3 results) Recent Labs    08/10/21 0835  PROBNP 157   HbA1C: No results for input(s): "HGBA1C" in the last 72 hours. CBG: No results for input(s): "GLUCAP" in the last 168 hours. Lipid Profile: No results for input(s): "CHOL", "HDL", "LDLCALC", "TRIG", "CHOLHDL", "LDLDIRECT" in the last 72 hours. Thyroid Function Tests: No results for input(s): "TSH", "T4TOTAL", "FREET4", "T3FREE", "THYROIDAB" in the last 72 hours. Anemia Panel: No results for input(s): "VITAMINB12", "FOLATE", "FERRITIN", "TIBC", "IRON", "RETICCTPCT" in the last 72 hours. Urine analysis:    Component Value Date/Time   COLORURINE YELLOW 05/15/2022 1723   APPEARANCEUR HAZY (A) 05/15/2022 1723   LABSPEC 1.012  05/15/2022 1723   PHURINE 6.0 05/15/2022 1723   GLUCOSEU NEGATIVE 05/15/2022 1723   HGBUR LARGE (A) 05/15/2022 1723   BILIRUBINUR NEGATIVE 05/15/2022 1723   KETONESUR NEGATIVE 05/15/2022 1723   PROTEINUR 30 (A) 05/15/2022 1723   NITRITE NEGATIVE 05/15/2022 1723   LEUKOCYTESUR LARGE (A) 05/15/2022 1723    Radiological Exams on Admission: I have personally reviewed images No results found.  EKG: My personal interpretation of EKG shows: NSR   Assessment/Plan Principal Problem:   Symptomatic anemia Active Problems:   Essential hypertension   Mixed hyperlipidemia   Chronic combined systolic and diastolic CHF (congestive heart failure) (HCC)   Urothelial carcinoma (HCC)    Assessment and Plan: * Symptomatic anemia Observation bed. Transfuse with 2 units PRBC. Check iron and TIBC prior to transfusion. Pt states she takes iron tablets every day due to her chronic anemia. Has had black stools but these stools are hard. Not sticky/soft like melena.  Urothelial carcinoma (HCC) Stable. Followed by urology. Pt has a chronic stent due to her urothelial cancer. Pt without any back pain or fevers, no dysuria. She is going to consistently have a dirty looking UA due to her chronic stent. Will not continue with abx as I don't think she has an active infection.  Chronic combined systolic and diastolic CHF (congestive heart failure) (HCC) Stable. Euvolemic. Will give 1 dose of IV lasix in between PRBC units.  Mixed hyperlipidemia Stable. On zetia and crestor.  Essential hypertension Stable. Due to low BP, pt told to stop hydralazine. Restart coreg and imdur after PRBC transfusion.    DVT prophylaxis: SCDs Code Status: Full Code Family Communication: discussed with pt and husband Lee at bedside  Disposition Plan: return home  Consults called: EDP consulted [facility]  Admission status: Observation, Telemetry bed   Kristopher Oppenheim, DO [facility] 05/15/2022, 9:49 PM

## 2022-05-16 ENCOUNTER — Other Ambulatory Visit (HOSPITAL_COMMUNITY): Payer: Self-pay

## 2022-05-16 DIAGNOSIS — C689 Malignant neoplasm of urinary organ, unspecified: Secondary | ICD-10-CM | POA: Diagnosis not present

## 2022-05-16 DIAGNOSIS — I10 Essential (primary) hypertension: Secondary | ICD-10-CM | POA: Diagnosis not present

## 2022-05-16 DIAGNOSIS — D649 Anemia, unspecified: Secondary | ICD-10-CM | POA: Diagnosis not present

## 2022-05-16 DIAGNOSIS — I5042 Chronic combined systolic (congestive) and diastolic (congestive) heart failure: Secondary | ICD-10-CM

## 2022-05-16 DIAGNOSIS — R195 Other fecal abnormalities: Secondary | ICD-10-CM | POA: Diagnosis not present

## 2022-05-16 DIAGNOSIS — E782 Mixed hyperlipidemia: Secondary | ICD-10-CM | POA: Diagnosis not present

## 2022-05-16 DIAGNOSIS — R319 Hematuria, unspecified: Secondary | ICD-10-CM | POA: Diagnosis not present

## 2022-05-16 DIAGNOSIS — Z7901 Long term (current) use of anticoagulants: Secondary | ICD-10-CM | POA: Diagnosis not present

## 2022-05-16 LAB — MAGNESIUM: Magnesium: 1.8 mg/dL (ref 1.7–2.4)

## 2022-05-16 LAB — COMPREHENSIVE METABOLIC PANEL
ALT: 19 U/L (ref 0–44)
AST: 20 U/L (ref 15–41)
Albumin: 3 g/dL — ABNORMAL LOW (ref 3.5–5.0)
Alkaline Phosphatase: 43 U/L (ref 38–126)
Anion gap: 8 (ref 5–15)
BUN: 12 mg/dL (ref 8–23)
CO2: 27 mmol/L (ref 22–32)
Calcium: 8.6 mg/dL — ABNORMAL LOW (ref 8.9–10.3)
Chloride: 105 mmol/L (ref 98–111)
Creatinine, Ser: 1.43 mg/dL — ABNORMAL HIGH (ref 0.44–1.00)
GFR, Estimated: 39 mL/min — ABNORMAL LOW (ref >60)
Glucose, Bld: 98 mg/dL (ref 70–99)
Potassium: 3.4 mmol/L — ABNORMAL LOW (ref 3.5–5.1)
Sodium: 140 mmol/L (ref 135–145)
Total Bilirubin: 0.4 mg/dL (ref 0.3–1.2)
Total Protein: 6.2 g/dL — ABNORMAL LOW (ref 6.5–8.1)

## 2022-05-16 LAB — CBC WITH DIFFERENTIAL/PLATELET
Abs Immature Granulocytes: 0.04 10*3/uL (ref 0.00–0.07)
Basophils Absolute: 0 10*3/uL (ref 0.0–0.1)
Basophils Relative: 0 %
Eosinophils Absolute: 0.3 10*3/uL (ref 0.0–0.5)
Eosinophils Relative: 4 %
HCT: 23.7 % — ABNORMAL LOW (ref 36.0–46.0)
Hemoglobin: 7.1 g/dL — ABNORMAL LOW (ref 12.0–15.0)
Immature Granulocytes: 1 %
Lymphocytes Relative: 29 %
Lymphs Abs: 2 10*3/uL (ref 0.7–4.0)
MCH: 29.2 pg (ref 26.0–34.0)
MCHC: 30 g/dL (ref 30.0–36.0)
MCV: 97.5 fL (ref 80.0–100.0)
Monocytes Absolute: 0.9 10*3/uL (ref 0.1–1.0)
Monocytes Relative: 14 %
Neutro Abs: 3.6 10*3/uL (ref 1.7–7.7)
Neutrophils Relative %: 52 %
Platelets: 164 10*3/uL (ref 150–400)
RBC: 2.43 MIL/uL — ABNORMAL LOW (ref 3.87–5.11)
RDW: 20.1 % — ABNORMAL HIGH (ref 11.5–15.5)
WBC: 6.9 10*3/uL (ref 4.0–10.5)
nRBC: 0.6 % — ABNORMAL HIGH (ref 0.0–0.2)

## 2022-05-16 LAB — IRON AND TIBC
Iron: 38 ug/dL (ref 28–170)
Saturation Ratios: 15 % (ref 10.4–31.8)
TIBC: 259 ug/dL (ref 250–450)
UIBC: 221 ug/dL

## 2022-05-16 LAB — CBC
HCT: 33.9 % — ABNORMAL LOW (ref 36.0–46.0)
Hemoglobin: 11 g/dL — ABNORMAL LOW (ref 12.0–15.0)
MCH: 29.6 pg (ref 26.0–34.0)
MCHC: 32.4 g/dL (ref 30.0–36.0)
MCV: 91.4 fL (ref 80.0–100.0)
Platelets: 172 10*3/uL (ref 150–400)
RBC: 3.71 MIL/uL — ABNORMAL LOW (ref 3.87–5.11)
RDW: 18.6 % — ABNORMAL HIGH (ref 11.5–15.5)
WBC: 6.4 10*3/uL (ref 4.0–10.5)
nRBC: 0.3 % — ABNORMAL HIGH (ref 0.0–0.2)

## 2022-05-16 LAB — PREPARE RBC (CROSSMATCH)

## 2022-05-16 MED ORDER — POTASSIUM CHLORIDE CRYS ER 20 MEQ PO TBCR
40.0000 meq | EXTENDED_RELEASE_TABLET | Freq: Once | ORAL | Status: AC
  Administered 2022-05-16: 40 meq via ORAL
  Filled 2022-05-16: qty 2

## 2022-05-16 MED ORDER — PANTOPRAZOLE SODIUM 40 MG IV SOLR
40.0000 mg | Freq: Two times a day (BID) | INTRAVENOUS | Status: DC
  Administered 2022-05-16: 40 mg via INTRAVENOUS
  Filled 2022-05-16: qty 10

## 2022-05-16 MED ORDER — APIXABAN 2.5 MG PO TABS
2.5000 mg | ORAL_TABLET | Freq: Two times a day (BID) | ORAL | 1 refills | Status: DC
  Filled 2022-05-16: qty 60, 30d supply, fill #0

## 2022-05-16 NOTE — Progress Notes (Signed)
Patient discharged home with husband, discharge instructions given and explained to patient/husband, they verbalized understanding, patient denies any pain/distress. No wound or pressure injury noted. Accompanied home by husband.

## 2022-05-16 NOTE — Discharge Summary (Signed)
Physician Discharge Summary  [name] EYC:[id number] DOB: Jul 18, 1946 DOA: 05/15/2022  PCP: [name], MD  Admit date: 05/15/2022  Discharge date: 05/16/2022 Admitted From: Home Disposition: Home Recommendations for Outpatient Follow-up:  Follow up with PCP in 1 week Patient has upcoming appointment with urologist in 3 weeks Check CBC and BMP in 1 week Please follow up on the following pending results: None  Home Health: Not indicated Equipment/Devices: Not indicated  Discharge Condition: Stable CODE STATUS: Full code   Hospital course 76 year old F with PMH of urothelial cancer status post double-J stent on 03/23/2022, recent small PE in 04/2022, HTN and chronic anemia sent to ED by GI due to low hemoglobin.  She had fatigue but no chest pain, dyspnea, palpitation or dizziness.  She started having dark stool after she started taking iron.  She denies frank hematuria or vaginal bleed.  She stopped taking Eliquis about a week prior to presentation.  She is on baby aspirin.   In ED, vitals stable.  Hgb 7.7 (8.3 on 7/19).  Hemoccult positive.  UA with hematuria.  Patient was admitted for blood transfusion.  [facility] consulted and felt patient's anemia to be due to hematuria.  Per GI, Hemoccult likely contaminated sample from hematuria.  Case discussed with urology, Dr. Gloriann Loan who recommended keeping outpatient follow-up as previously planned.    Patient was transfused 2 units with appropriate response.  Felt better to go home.  After risk-benefit discussion, we have decided to continue low-dose Eliquis as VTE prophylaxis.  Patient was advised to stop aspirin and avoid NSAIDs.   See individual problem list below for more.   Problems addressed during this hospitalization Principal Problem:   Symptomatic anemia Active Problems:   Essential hypertension   Mixed hyperlipidemia   Chronic combined systolic and diastolic CHF (congestive heart failure) (HCC)   Urothelial  carcinoma (HCC)     Vital signs Vitals:   05/16/22 0520 05/16/22 0600 05/16/22 0845 05/16/22 1305  BP: (!) 135/52 127/71 (!) 142/63 118/62  Pulse: 71 68 71 65  Temp: 98 F (36.7 C) 98.5 F (36.9 C) 98.2 F (36.8 C) 97.9 F (36.6 C)  Resp: '18 18 18 16  '$ Height:      Weight:      SpO2: 98% 95% 95% 96%  TempSrc: Oral Oral Oral Oral  BMI (Calculated):         Discharge exam  GENERAL: No apparent distress.  Nontoxic. HEENT: MMM.  Vision and hearing grossly intact.  NECK: Supple.  No apparent JVD.  RESP:  No IWOB.  Fair aeration bilaterally. CVS:  RRR. Heart sounds normal.  ABD/GI/GU: BS+. Abd soft, NTND.  MSK/EXT:  Moves extremities. No apparent deformity. No edema.  SKIN: no apparent skin lesion or wound NEURO: Awake and alert. Oriented appropriately.  No apparent focal neuro deficit. PSYCH: Calm. Normal affect.   Discharge Instructions Discharge Instructions     Diet - low sodium heart healthy   Complete by: As directed    Discharge instructions   Complete by: As directed    It has been a pleasure taking care of you!  You were hospitalized due to anemia/low hemoglobin likely from slow blood loss in urine after stent placement.  You have been treated with blood transfusion and your symptoms improved.  We have stopped your aspirin and decreased your Eliquis to 2.5 mg twice daily to reduce your risk of bleeding.  Please review your new medication list and the directions on your medications  before you take them.  Avoid any over-the-counter pain medication other than plain Tylenol.  Follow-up with your primary care doctor in 1 to 2 weeks or sooner if needed.  Follow-up with your urologist as previously planned.   Take care,   Increase activity slowly   Complete by: As directed       Allergies as of 05/16/2022       Reactions   Erythromycin Diarrhea, Nausea And Vomiting   Lotensin [benazepril Hcl] Cough   Nitrofurantoin Other (See Comments)        Medication List      STOP taking these medications    aspirin EC 81 MG tablet       TAKE these medications    acetaminophen 500 MG tablet Commonly known as: TYLENOL Take 500-1,000 mg by mouth every 6 (six) hours as needed (pain).   apixaban 2.5 MG Tabs tablet Commonly known as: Eliquis Take 1 tablet (2.5 mg total) by mouth 2 (two) times daily. What changed:  medication strength how much to take   calcium carbonate 750 MG chewable tablet Commonly known as: TUMS EX Chew 1 tablet by mouth every morning.   carvedilol 6.25 MG tablet Commonly known as: COREG TAKE 1 TABLET(6.25 MG) BY MOUTH TWICE DAILY WITH A MEAL What changed:  how much to take how to take this when to take this additional instructions   cholecalciferol 25 MCG (1000 UNIT) tablet Commonly known as: VITAMIN D3 Take 1,000 Units by mouth every morning.   ezetimibe 10 MG tablet Commonly known as: ZETIA Take 1 tablet (10 mg total) by mouth daily.   hydrALAZINE 50 MG tablet Commonly known as: APRESOLINE TAKE 1 TABLET(50 MG) BY MOUTH EVERY 8 HOURS   HYDROcodone-acetaminophen 5-325 MG tablet Commonly known as: NORCO/VICODIN Take 1 tablet by mouth every 6 (six) hours as needed for pain.   isosorbide mononitrate 30 MG 24 hr tablet Commonly known as: IMDUR Take 0.5 tablets (15 mg total) by mouth daily. What changed: when to take this   oxybutynin 5 MG tablet Commonly known as: DITROPAN Take 5 mg by mouth every 6 (six) hours as needed (bladder pain).   rosuvastatin 40 MG tablet Commonly known as: CRESTOR Take 1 tablet (40 mg total) by mouth daily. What changed: when to take this   sertraline 50 MG tablet Commonly known as: ZOLOFT Take 50 mg by mouth every morning.   traZODone 150 MG tablet Commonly known as: DESYREL Take 150 mg by mouth at bedtime.        Consultations: Gastroenterology Urology over the phone  Procedures/Studies:   CT HEAD WO CONTRAST (5MM)  Result Date: 05/11/2022 CLINICAL DATA:   Posterior head pressure with dizziness. Multiple falls. EXAM: CT HEAD WITHOUT CONTRAST TECHNIQUE: Contiguous axial images were obtained from the base of the skull through the vertex without intravenous contrast. RADIATION DOSE REDUCTION: This exam was performed according to the departmental dose-optimization program which includes automated exposure control, adjustment of the mA and/or kV according to patient size and/or use of iterative reconstruction technique. COMPARISON:  None Available. FINDINGS: Brain: There is no evidence of acute intracranial hemorrhage, mass lesion, brain edema or extra-axial fluid collection. The ventricles and subarachnoid spaces are appropriately sized for age. There is no CT evidence of acute cortical infarction. Mild patchy low-density in the periventricular white matter, likely due to chronic small vessel ischemic changes. Vascular:  No hyperdense vessel identified. Skull: Negative for fracture or focal lesion. Sinuses/Orbits: The visualized paranasal sinuses and mastoid air  cells are clear. No orbital abnormalities are seen. Other: None. IMPRESSION: No acute intracranial findings. Mild periventricular white matter disease, likely due to chronic small vessel ischemic changes. Electronically Signed   By: Richardean Sale M.D.   On: 05/11/2022 10:28   VAS [place] LOWER EXTREMITY VENOUS (DVT)  Result Date: 04/24/2022  Lower Venous DVT Study Patient Name:  AMISADAI WOODFORD  Date of Exam:   04/24/2022 Medical Rec #: [id number]          Accession #:    [id number] Date of Birth: 1945-12-04          Patient Gender: F Patient Age:   23 years Exam Location:  [facility] Procedure:      VAS [place] LOWER EXTREMITY VENOUS (DVT) Referring Phys: Eulogio Bear --------------------------------------------------------------------------------  Indications: Pulmonary embolism.  Risk Factors: Confirmed PE. Comparison Study: No prior studies. Performing Technologist: Oliver Hum RVT  Examination  Guidelines: A complete evaluation includes B-mode imaging, spectral Doppler, color Doppler, and power Doppler as needed of all accessible portions of each vessel. Bilateral testing is considered an integral part of a complete examination. Limited examinations for reoccurring indications may be performed as noted. The reflux portion of the exam is performed with the patient in reverse Trendelenburg.  +---------+---------------+---------+-----------+----------+--------------+ RIGHT    CompressibilityPhasicitySpontaneityPropertiesThrombus Aging +---------+---------------+---------+-----------+----------+--------------+ CFV      Full           Yes      Yes                                 +---------+---------------+---------+-----------+----------+--------------+ SFJ      Full                                                        +---------+---------------+---------+-----------+----------+--------------+ FV Prox  Full                                                        +---------+---------------+---------+-----------+----------+--------------+ FV Mid   Full                                                        +---------+---------------+---------+-----------+----------+--------------+ FV DistalFull                                                        +---------+---------------+---------+-----------+----------+--------------+ PFV      Full                                                        +---------+---------------+---------+-----------+----------+--------------+ POP      Full  Yes      Yes                                 +---------+---------------+---------+-----------+----------+--------------+ PTV      Full                                                        +---------+---------------+---------+-----------+----------+--------------+ PERO     Full                                                         +---------+---------------+---------+-----------+----------+--------------+   +---------+---------------+---------+-----------+----------+--------------+ LEFT     CompressibilityPhasicitySpontaneityPropertiesThrombus Aging +---------+---------------+---------+-----------+----------+--------------+ CFV      Full           Yes      Yes                                 +---------+---------------+---------+-----------+----------+--------------+ SFJ      Full                                                        +---------+---------------+---------+-----------+----------+--------------+ FV Prox  Full                                                        +---------+---------------+---------+-----------+----------+--------------+ FV Mid   Full                                                        +---------+---------------+---------+-----------+----------+--------------+ FV DistalFull                                                        +---------+---------------+---------+-----------+----------+--------------+ PFV      Full                                                        +---------+---------------+---------+-----------+----------+--------------+ POP      Full           Yes      Yes                                 +---------+---------------+---------+-----------+----------+--------------+ PTV  Full                                                        +---------+---------------+---------+-----------+----------+--------------+ PERO     Full                                                        +---------+---------------+---------+-----------+----------+--------------+     Summary: RIGHT: - There is no evidence of deep vein thrombosis in the lower extremity.  - No cystic structure found in the popliteal fossa.  LEFT: - There is no evidence of deep vein thrombosis in the lower extremity.  - No cystic structure found in the popliteal fossa.   *See table(s) above for measurements and observations. Electronically signed by Harold Barban MD on 04/24/2022 at 1:33:51 PM.    Final    CT Angio Chest Pulmonary Embolism (PE) W or WO Contrast  Addendum Date: 04/22/2022   ADDENDUM REPORT: 04/22/2022 09:18 ADDENDUM: Study discussed by telephone with Dr. Mal Misty on 04/22/2022 at 0910 hours. Electronically Signed   By: Genevie Ann M.D.   On: 04/22/2022 09:18   Result Date: 04/22/2022 CLINICAL DATA:  76 year old female with sepsis. Left ureteral carcinoma, stent. EXAM: CT ANGIOGRAPHY CHEST WITH CONTRAST TECHNIQUE: Multidetector CT imaging of the chest was performed using the standard protocol during bolus administration of intravenous contrast. Multiplanar CT image reconstructions and MIPs were obtained to evaluate the vascular anatomy. RADIATION DOSE REDUCTION: This exam was performed according to the departmental dose-optimization program which includes automated exposure control, adjustment of the mA and/or kV according to patient size and/or use of iterative reconstruction technique. CONTRAST:  77m OMNIPAQUE IOHEXOL 350 MG/ML SOLN COMPARISON:  CT Abdomen and Pelvis yesterday. PET-CT 04/05/2022. FINDINGS: Cardiovascular: Good contrast bolus timing in the pulmonary arterial tree. Mild respiratory motion. Questionable small nonocclusive thrombus right lower lobe at the segmental branching level series 5, image 151. No other pulmonary artery filling defect. No central or saddle embolus. Calcified coronary artery atherosclerosis. No cardiomegaly or pericardial effusion. Calcified aortic atherosclerosis. Mediastinum/Nodes: Negative. No mediastinal mass or lymphadenopathy. Lungs/Pleura: Layering and enhancing dependent atelectasis in both lungs. Major airways are patent. There is mild bilateral bronchial wall thickening. Mild biapical pulmonary septal thickening. No consolidation. No pleural effusion. Upper Abdomen: Stable visible upper abdominal viscera. Musculoskeletal:  Thoracic spine degeneration. No acute or suspicious osseous lesion. Review of the MIP images confirms the above findings. IMPRESSION: 1. Evidence of only a solitary, small nonocclusive thrombus in the right lower lobe at the segmental branching level. Significance is doubtful, with no other pulmonary embolus identified. 2. Bilateral pulmonary atelectasis. No pleural effusion or evidence of pulmonary infarct. 3. Calcified coronary artery atherosclerosis. Aortic Atherosclerosis (ICD10-I70.0). Electronically Signed: By: HGenevie AnnM.D. On: 04/22/2022 09:03   CT ABDOMEN PELVIS W CONTRAST  Result Date: 04/21/2022 CLINICAL DATA:  Sepsis Abdominal/flank pain, hematuria (Ped 0-17y). Prior right nephrectomy for urothelial carcinoma. EXAM: CT ABDOMEN AND PELVIS WITH CONTRAST TECHNIQUE: Multidetector CT imaging of the abdomen and pelvis was performed using the standard protocol following bolus administration of intravenous contrast. RADIATION DOSE REDUCTION: This exam was performed according to the departmental dose-optimization program which includes automated exposure control,  adjustment of the mA and/or kV according to patient size and/or use of iterative reconstruction technique. CONTRAST:  60m OMNIPAQUE IOHEXOL 300 MG/ML  SOLN COMPARISON:  03/11/2022.  PET CT 04/05/2022 FINDINGS: Lower chest: No acute abnormality Hepatobiliary: No focal hepatic abnormality. Gallbladder unremarkable. Pancreas: No focal abnormality or ductal dilatation. Spleen: No focal abnormality.  Normal size. Adrenals/Urinary Tract: Prior right nephrectomy. Left ureteral stent remains in place. No hydronephrosis. There is mild left perinephric stranding which is increased since prior study. Adrenal glands and urinary bladder unremarkable. Stomach/Bowel: Stomach, large and small bowel grossly unremarkable. Normal appendix. Vascular/Lymphatic: Heavily calcified aorta. No aneurysm. Mildly prominent left periaortic lymph nodes. Index node has a short axis  diameter of 7 mm. None pathologically enlarged or changed since prior study. Reproductive: Prior hysterectomy.  No adnexal masses. Other: No free fluid or free air. Musculoskeletal: No acute bony abnormality. IMPRESSION: Left ureteral stent remains in place. There is mild left perinephric stranding which is increased since prior study. No hydronephrosis. Aortic atherosclerosis. Sigmoid diverticulosis. Electronically Signed   By: KRolm BaptiseM.D.   On: 04/21/2022 20:25   DG Chest Port 1 View  Result Date: 04/21/2022 CLINICAL DATA:  Questionable sepsis - evaluate for abnormality EXAM: PORTABLE CHEST 1 VIEW COMPARISON:  11/27/2018 FINDINGS: Heart and mediastinal contours are within normal limits. No focal opacities or effusions. No acute bony abnormality. IMPRESSION: No active disease. Electronically Signed   By: KRolm BaptiseM.D.   On: 04/21/2022 20:03       The results of significant diagnostics from this hospitalization (including imaging, microbiology, ancillary and laboratory) are listed below for reference.     Microbiology: No results found for this or any previous visit (from the past 240 hour(s)).   Labs:  CBC: Recent Labs  Lab 05/15/22 1617 05/16/22 0026 05/16/22 1339  WBC 6.3 6.9 6.4  NEUTROABS  --  3.6  --   HGB 7.7* 7.1* 11.0*  HCT 24.9* 23.7* 33.9*  MCV 95.8 97.5 91.4  PLT 184 164 172   BMP &GFR Recent Labs  Lab 05/15/22 1617 05/16/22 0026  NA 141 140  K 3.4* 3.4*  CL 102 105  CO2 29 27  GLUCOSE 100* 98  BUN 12 12  CREATININE 1.44* 1.43*  CALCIUM 9.6 8.6*  MG 1.8 1.8  PHOS 3.7  --    Estimated Creatinine Clearance: 33.9 mL/min (A) (by C-G formula based on SCr of 1.43 mg/dL (H)). Liver & Pancreas: Recent Labs  Lab 05/15/22 1617 05/16/22 0026  AST '21  19 20  '$ ALT '17  18 19  '$ ALKPHOS 49  48 43  BILITOT 0.3  0.4 0.4  PROT 7.2  7.2 6.2*  ALBUMIN 3.9  3.9 3.0*   Recent Labs  Lab 05/15/22 1617  LIPASE 22   No results for input(s): "AMMONIA" in  the last 168 hours. Diabetic: No results for input(s): "HGBA1C" in the last 72 hours. No results for input(s): "GLUCAP" in the last 168 hours. Cardiac Enzymes: Recent Labs  Lab 05/15/22 1617  CKTOTAL 26*   Recent Labs    08/10/21 0835  PROBNP 157   Coagulation Profile: No results for input(s): "INR", "PROTIME" in the last 168 hours. Thyroid Function Tests: No results for input(s): "TSH", "T4TOTAL", "FREET4", "T3FREE", "THYROIDAB" in the last 72 hours. Lipid Profile: No results for input(s): "CHOL", "HDL", "LDLCALC", "TRIG", "CHOLHDL", "LDLDIRECT" in the last 72 hours. Anemia Panel: Recent Labs    05/16/22 0026  TIBC 259  IRON 38   Urine  analysis:    Component Value Date/Time   COLORURINE YELLOW 05/15/2022 1723   APPEARANCEUR HAZY (A) 05/15/2022 1723   LABSPEC 1.012 05/15/2022 1723   PHURINE 6.0 05/15/2022 1723   GLUCOSEU NEGATIVE 05/15/2022 1723   HGBUR LARGE (A) 05/15/2022 1723   BILIRUBINUR NEGATIVE 05/15/2022 1723   KETONESUR NEGATIVE 05/15/2022 1723   PROTEINUR 30 (A) 05/15/2022 1723   NITRITE NEGATIVE 05/15/2022 1723   LEUKOCYTESUR LARGE (A) 05/15/2022 1723   Sepsis Labs: Invalid input(s): "PROCALCITONIN", "LACTICIDVEN"   SIGNED:  Mercy Riding, MD  [facility] 05/16/2022, 3:05 PM

## 2022-05-16 NOTE — Care Management CC44 (Signed)
Condition Code 44 Documentation Completed  Patient Details  Name: [name] MRN: [id number] Date of Birth: 03/23/1946   Condition Code 44 given:  Yes Patient signature on Condition Code 44 notice:  Yes Documentation of 2 MD's agreement:  Yes Code 44 added to claim:  Yes    Dessa Phi, RN 05/16/2022, 2:27 PM

## 2022-05-16 NOTE — Care Management Obs Status (Signed)
[place] NOTIFICATION   Patient Details  Name: [name] MRN: [id number] Date of Birth: 1945/11/13   Medicare Observation Status Notification Given:  Yes    MahabirJuliann Pulse, RN 05/16/2022, 2:26 PM

## 2022-05-16 NOTE — Consult Note (Signed)
[facility] Consultation Note  Referring Provider: [facility] Primary Care Physician:  [name], MD Primary Gastroenterologist:  [name]  Reason for Consultation:  anemia, heme-positive stool  HPI: [name] is a 76 y.o. female presenting symptomatic anemia, with weakness, dizziness, presyncope.  Has dark stools, starting right when she started taking iron for anemia.  No overt GI bleeding.  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  Has noticed 2-3 times per day, for about one week (but stopped few days ago), bright red blood when she wiped her urethra after urination.  Recently placed on anticoagulation from pulmonary embolism in July 2023.   Past Medical History:  Diagnosis Date   Anxiety    Bilateral swelling of feet 03/11/2022   bilateral feet edema much improved per pt on 03-20-2022, bilateral foot edema resolves after propping both feet up   CAD in native artery    a. cath 10/2019- mild to moderate dx >> medical therapy    Cancer [facility])    left ureteral cancer dx sept 2020   CHF (congestive heart failure) ([place])    Chronic kidney disease 06/2019   acute renal insufficiency sees dr Dorina Hoyer, ckd stage 3 per pt   COVID 09/2020   tired sore throat nausea hair fell out loss of taste and smell x 10 days, all symptoms resolved except still has loss of taste and smell   Depression    GERD (gastroesophageal reflux disease)    Hyperlipidemia    Hypertension     Past Surgical History:  Procedure Laterality Date   ABDOMINAL HYSTERECTOMY     partial   bladder-partial removal     CYSTOSCOPY W/ URETERAL STENT PLACEMENT Left 11/17/2019   Procedure: CYSTOSCOPY WITH STENT REPLACEMENT;  Surgeon: Franchot Gallo, MD;  Location: [facility];  Service: Urology;  Laterality: Left;   CYSTOSCOPY W/ URETERAL STENT PLACEMENT Left 03/23/2022   Procedure: CYSTOSCOPY WITH RETROGRADE  PYELOGRAM, LEFT URETERAL STENT PLACEMENT;  Surgeon: Franchot Gallo, MD;  Location: [facility];  Service: Urology;  Laterality: Left;   CYSTOSCOPY W/ URETERAL STENT REMOVAL Left 05/10/2020   Procedure: CYSTOSCOPY WITH STENT REMOVAL;  Surgeon: Franchot Gallo, MD;  Location: [facility];  Service: Urology;  Laterality: Left;   CYSTOSCOPY W/ URETERAL STENT REMOVAL Left 05/02/2021   Procedure: CYSTOSCOPY WITH STENT REMOVAL;  Surgeon: Franchot Gallo, MD;  Location: [facility];  Service: Urology;  Laterality: Left;   CYSTOSCOPY WITH RETROGRADE PYELOGRAM, URETEROSCOPY AND STENT PLACEMENT Left 06/27/2019   Procedure: CYSTOSCOPY WITH LEFT RETROGRADE PYELOGRAM, URETEROSCOPY, BIOPSY AND LEFT STENT PLACEMENT;  Surgeon: Irine Seal, MD;  Location: [facility];  Service: Urology;  Laterality: Left;   CYSTOSCOPY WITH RETROGRADE PYELOGRAM, URETEROSCOPY AND STENT PLACEMENT Left 08/18/2019   Procedure: CYSTOSCOPY, URETEROSCOPY AND STENT EXCHANGE;  Surgeon: Franchot Gallo, MD;  Location: [facility];  Service: Urology;  Laterality: Left;  27 MINS   CYSTOSCOPY WITH RETROGRADE PYELOGRAM, URETEROSCOPY AND STENT PLACEMENT Left 11/17/2019   Procedure: CYSTOSCOPY WITH RETROGRADE PYELOGRAM, URETEROSCOPY AND STENT PLACEMENT;  Surgeon: Franchot Gallo, MD;  Location: [facility];  Service: Urology;  Laterality: Left;  1 MINS   CYSTOSCOPY WITH RETROGRADE PYELOGRAM, URETEROSCOPY AND STENT PLACEMENT Left 05/10/2020   Procedure: CYSTOSCOPY WITH RETROGRADE PYELOGRAM, URETEROSCOPY AND STENT PLACEMENT WITH URETHRAL DIALATION AND BRUSH BIOPSY;  Surgeon: Franchot Gallo, MD;  Location: [facility];  Service: Urology;  Laterality: Left;  1 HR  CYSTOSCOPY WITH RETROGRADE PYELOGRAM, URETEROSCOPY AND STENT PLACEMENT Left 09/16/2020   Procedure: CYSTOSCOPY WITH RETROGRADE PYELOGRAM, URETEROSCOPY ,  LITHOPAXY, LEFT URETERAL BRUSHING, AND STENT REPLACEMENT;  Surgeon:  Franchot Gallo, MD;  Location: [facility];  Service: Urology;  Laterality: Left;   CYSTOSCOPY WITH RETROGRADE PYELOGRAM, URETEROSCOPY AND STENT PLACEMENT Left 10/24/2021   Procedure: CYSTOSCOPY WITH RETROGRADE PYELOGRAM, URETEROSCOPY, POSSIBLE URETERAL AND RENAL BIOPSIES AND STENT PLACEMENT;  Surgeon: Franchot Gallo, MD;  Location: [facility];  Service: Urology;  Laterality: Left;  1 HR   CYSTOSCOPY WITH RETROGRADE PYELOGRAM, URETEROSCOPY AND STENT PLACEMENT Left 03/09/2022   Procedure: CYSTOSCOPY WITH ANTEGRADE PYELOGRAM,  STENT REMOVAL;  Surgeon: Franchot Gallo, MD;  Location: [facility];  Service: Urology;  Laterality: Left;   CYSTOSCOPY/URETEROSCOPY/HOLMIUM LASER/STENT PLACEMENT Left 05/02/2021   Procedure: CYSTOSCOPY/URETEROSCOPY WITH BRUSH BIOPSY/ RETROGRADE PYELOGRAM/ HOLMIUM LASER/STENT REPLACEMENT;  Surgeon: Franchot Gallo, MD;  Location: [facility];  Service: Urology;  Laterality: Left;   HOLMIUM LASER APPLICATION Left 50/11/7739   Procedure: HOLMIUM LASER APPLICATION;  Surgeon: Franchot Gallo, MD;  Location: [facility];  Service: Urology;  Laterality: Left;   HOLMIUM LASER APPLICATION Left 2/87/8676   Procedure: HOLMIUM LASER APPLICATION OF TUMORS;  Surgeon: Franchot Gallo, MD;  Location: [facility];  Service: Urology;  Laterality: Left;   IR NEPHROSTOMY PLACEMENT LEFT  07/06/2021   IR NEPHROSTOMY PLACEMENT LEFT  02/06/2022   kidney removed  2006   right    NEPHROSTOMY TUBE REMOVAL Left 03/23/2022   Procedure: NEPHROSTOMY TUBE REMOVAL;  Surgeon: Franchot Gallo, MD;  Location: [facility];  Service: Urology;  Laterality: Left;   RIGHT/LEFT HEART CATH AND CORONARY ANGIOGRAPHY N/A 11/06/2019   Procedure: RIGHT/LEFT HEART CATH AND CORONARY ANGIOGRAPHY;  Surgeon: Belva Crome, MD;  Location: [facility];  Service: Cardiovascular;  Laterality: N/A;    THULIUM LASER TURP (TRANSURETHRAL RESECTION OF PROSTATE) Left 08/18/2019   Procedure: THULIUM LASER ABLATION OF URETERAL TUMOR;  Surgeon: Franchot Gallo, MD;  Location: [facility];  Service: Urology;  Laterality: Left;   THULIUM LASER TURP (TRANSURETHRAL RESECTION OF PROSTATE) Left 11/17/2019   Procedure: THULIUM LASER of URETERAL CANCER;  Surgeon: Franchot Gallo, MD;  Location: [facility];  Service: Urology;  Laterality: Left;    Prior to Admission medications   Medication Sig Start Date End Date Taking? Authorizing Provider  acetaminophen (TYLENOL) 500 MG tablet Take 500-1,000 mg by mouth every 6 (six) hours as needed (pain).   Yes [provider]  aspirin EC 81 MG tablet Take 1 tablet (81 mg total) by mouth daily. Patient taking differently: Take 81 mg by mouth every morning. 11/03/19  Yes Nahser, Wonda Cheng, MD  calcium carbonate (TUMS EX) 750 MG chewable tablet Chew 1 tablet by mouth every morning.   Yes [provider]  carvedilol (COREG) 6.25 MG tablet TAKE 1 TABLET(6.25 MG) BY MOUTH TWICE DAILY WITH A MEAL Patient taking differently: Take 6.25 mg by mouth 2 (two) times daily with a meal. 11/22/21  Yes Chandrasekhar, Mahesh A, MD  cholecalciferol (VITAMIN D3) 25 MCG (1000 UNIT) tablet Take 1,000 Units by mouth every morning.   Yes [provider]  ezetimibe (ZETIA) 10 MG tablet Take 1 tablet (10 mg total) by mouth daily. 12/21/21  Yes Chandrasekhar, Mahesh A, MD  isosorbide mononitrate (IMDUR) 30 MG 24 hr tablet Take 0.5 tablets (15 mg total) by mouth daily. Patient taking differently: Take 15 mg by mouth  every morning. 12/13/21  Yes Chandrasekhar, Mahesh A, MD  oxybutynin (DITROPAN) 5 MG tablet Take 5 mg by mouth every 6 (six) hours as needed (bladder pain). 12/13/21  Yes [provider]  rosuvastatin (CRESTOR) 40 MG tablet Take 1 tablet (40 mg total) by mouth daily. Patient taking differently: Take 40 mg by mouth every morning. 12/02/21  Yes  Chandrasekhar, Mahesh A, MD  sertraline (ZOLOFT) 50 MG tablet Take 50 mg by mouth every morning.   Yes [provider]  traZODone (DESYREL) 150 MG tablet Take 150 mg by mouth at bedtime. 08/24/20  Yes [provider]  apixaban (ELIQUIS) 5 MG TABS tablet Take 1 tablet (5 mg total) by mouth 2 (two) times daily. To start after loading dose. Patient not taking: Reported on 05/15/2022 04/29/22 05/29/22  Darliss Cheney, MD  hydrALAZINE (APRESOLINE) 50 MG tablet TAKE 1 TABLET(50 MG) BY MOUTH EVERY 8 HOURS Patient not taking: Reported on 05/15/2022 02/13/22   Werner Lean, MD  HYDROcodone-acetaminophen (NORCO/VICODIN) 5-325 MG tablet Take 1 tablet by mouth every 6 (six) hours as needed for pain. Patient not taking: Reported on 05/15/2022 02/24/22   [provider]    Current Facility-Administered Medications  Medication Dose Route Frequency Provider Last Rate Last Admin   acetaminophen (TYLENOL) tablet 650 mg  650 mg Oral Q6H PRN Kristopher Oppenheim, DO       Or   acetaminophen (TYLENOL) suppository 650 mg  650 mg Rectal Q6H PRN Kristopher Oppenheim, DO       carvedilol (COREG) tablet 6.25 mg  6.25 mg Oral BID WC Kristopher Oppenheim, DO   6.25 mg at 05/16/22 0848   ezetimibe (ZETIA) tablet 10 mg  10 mg Oral Daily Kristopher Oppenheim, DO   10 mg at 05/16/22 6160   feeding supplement (ENSURE ENLIVE / ENSURE PLUS) liquid 237 mL  237 mL Oral BID BM Kristopher Oppenheim, DO       HYDROcodone-acetaminophen (NORCO/VICODIN) 5-325 MG per tablet 1 tablet  1 tablet Oral Q6H PRN Kristopher Oppenheim, DO       isosorbide mononitrate (IMDUR) 24 hr tablet 15 mg  15 mg Oral q morning Kristopher Oppenheim, DO   15 mg at 05/16/22 0902   ondansetron (ZOFRAN) tablet 4 mg  4 mg Oral Q6H PRN Kristopher Oppenheim, DO       Or   ondansetron [facility]) injection 4 mg  4 mg Intravenous Q6H PRN Kristopher Oppenheim, DO       pantoprazole (PROTONIX) injection 40 mg  40 mg Intravenous Q12H Wendee Beavers T, MD   40 mg at 05/16/22 0902   rosuvastatin (CRESTOR) tablet 40 mg  40 mg Oral q  morning Kristopher Oppenheim, DO   40 mg at 05/16/22 0902    Allergies as of 05/15/2022 - Review Complete 05/15/2022  Allergen Reaction Noted   Erythromycin Diarrhea and Nausea And Vomiting 12/05/2013   Lotensin [benazepril hcl] Cough 08/05/2019   Nitrofurantoin Other (See Comments) 05/10/2022    Family History  Problem Relation Age of Onset   Hypertension Mother    Diabetes Mellitus II Sister    Hypertension Sister    Hypertension Brother     Social History   Socioeconomic History   Marital status: Married    Spouse name: Not on file   Number of children: Not on file   Years of education: Not on file   Highest education level: Not on file  Occupational History   Not on file  Tobacco Use   Smoking status:  Former    Packs/day: 0.50    Years: 38.00    Total pack years: 19.00    Types: Cigarettes    Quit date: 11/26/2003    Years since quitting: 18.4   Smokeless tobacco: Never  Vaping Use   Vaping Use: Never used  Substance and Sexual Activity   Alcohol use: No    Alcohol/week: 0.0 standard drinks of alcohol   Drug use: No   Sexual activity: Yes    Birth control/protection: Post-menopausal  Other Topics Concern   Not on file  Social History Narrative   Not on file   Social Determinants of Health   Financial Resource Strain: Not on file  Food Insecurity: Not on file  Transportation Needs: Not on file  Physical Activity: Not on file  Stress: Not on file  Social Connections: Not on file  Intimate Partner Violence: Not on file    Review of Systems: As per HPI, all others negative  Physical Exam: Vital signs in last 24 hours: Temp:  [97.6 F (36.4 C)-98.6 F (37 C)] 98.2 F (36.8 C) (08/08 0845) Pulse Rate:  [63-76] 71 (08/08 0845) Resp:  [13-20] 18 (08/08 0845) BP: (121-142)/(49-71) 142/63 (08/08 0845) SpO2:  [92 %-99 %] 95 % (08/08 0845) Weight:  [74.7 kg] 74.7 kg (08/07 2039) Last BM Date : 05/15/22 General:   Alert,  Well-developed, well-nourished, pleasant  and cooperative in NAD Head:  Normocephalic and atraumatic. Eyes:  Sclera clear, no icterus.   Conjunctiva pale Ears:  Normal auditory acuity. Nose:  No deformity, discharge,  or lesions. Mouth:  No deformity or lesions.  Oropharynx pale, dry Neck:  Supple; no masses or thyromegaly. Heart:  Regular rate and rhythm; no murmurs, clicks, rubs,  or gallops. Abdomen:  Soft, nontender and nondistended. No masses, hepatosplenomegaly or hernias noted. Normal bowel sounds, without guarding, and without rebound.     Msk:  Symmetrical without gross deformities. Normal posture. Pulses:  Normal pulses noted. Extremities:  Without clubbing or edema. Neurologic:  Alert and  oriented x4;  grossly normal neurologically. Skin:  Intact without significant lesions or rashes. Psych:  Alert and cooperative. Normal mood and affect.   Lab Results: Recent Labs    05/15/22 1617 05/16/22 0026  WBC 6.3 6.9  HGB 7.7* 7.1*  HCT 24.9* 23.7*  PLT 184 164   BMET Recent Labs    05/15/22 1617 05/16/22 0026  NA 141 140  K 3.4* 3.4*  CL 102 105  CO2 29 27  GLUCOSE 100* 98  BUN 12 12  CREATININE 1.44* 1.43*  CALCIUM 9.6 8.6*   LFT Recent Labs    05/15/22 1617 05/16/22 0026  PROT 7.2  7.2 6.2*  ALBUMIN 3.9  3.9 3.0*  AST '21  19 20  '$ ALT '17  18 19  '$ ALKPHOS 49  48 43  BILITOT 0.3  0.4 0.4  BILIDIR 0.1  --   IBILI 0.2*  --    PT/INR No results for input(s): "LABPROT", "INR" in the last 72 hours.  Studies/Results: No results found.  Impression:   Hematuria.  History ureteral cancer with chemotherapy and ureteral stenting, last a couple months ago. Acute blood loss anemia. Anticoagulation for PE in July 2023, now on hold. Dark stools, started after taking iron. Heme-positive stools.  Plan:   I do not think this is principally a GI issue.  Heme-positive stool could easily be explained by contamination from urologic bleeding. Hematuria, anticoagulation, and recent ureteral stenting for  ureteral cancer make  urological source of bleeding much more likely. I would manage patient's anemia supportively, but would not plan any GI tract endoscopic evaluation. Recommend urology consultation. Anticoagulation on hold; if PE small might even consider not restarting anticoagulation, but will defer this decision to primary team. Diet ok from GI perspective. [facility] will sign-off; please call with questions; thank you for the consultation.   LOS: 1 day   Tamina Cyphers M  05/16/2022, 9:12 AM  Cell [phone] If no answer or after 5 PM call [phone]

## 2022-05-17 LAB — TYPE AND SCREEN
ABO/RH(D): O POS
Antibody Screen: NEGATIVE
Unit division: 0
Unit division: 0

## 2022-05-17 LAB — BPAM RBC
Blood Product Expiration Date: 202309072359
Blood Product Expiration Date: 202309072359
ISSUE DATE / TIME: 202308080147
ISSUE DATE / TIME: 202308080537
Unit Type and Rh: 5100
Unit Type and Rh: 5100

## 2022-05-17 LAB — URINE CULTURE

## 2022-05-23 ENCOUNTER — Ambulatory Visit
Admission: RE | Admit: 2022-05-23 | Discharge: 2022-05-23 | Disposition: A | Discharge status: Home / Self Care | Payer: Medicare PPO | Source: Ambulatory Visit | Attending: Family Medicine | Admitting: Family Medicine

## 2022-05-23 DIAGNOSIS — Z1231 Encounter for screening mammogram for malignant neoplasm of breast: Secondary | ICD-10-CM

## 2022-05-24 DIAGNOSIS — R1084 Generalized abdominal pain: Secondary | ICD-10-CM | POA: Diagnosis not present

## 2022-05-24 DIAGNOSIS — R110 Nausea: Secondary | ICD-10-CM | POA: Diagnosis not present

## 2022-05-24 DIAGNOSIS — D649 Anemia, unspecified: Secondary | ICD-10-CM | POA: Diagnosis not present

## 2022-05-25 DIAGNOSIS — R1084 Generalized abdominal pain: Secondary | ICD-10-CM | POA: Diagnosis not present

## 2022-05-26 DIAGNOSIS — R1084 Generalized abdominal pain: Secondary | ICD-10-CM | POA: Diagnosis not present

## 2022-05-30 ENCOUNTER — Encounter (HOSPITAL_COMMUNITY): Payer: Self-pay

## 2022-05-30 ENCOUNTER — Other Ambulatory Visit: Payer: Self-pay

## 2022-05-30 ENCOUNTER — Emergency Department (HOSPITAL_COMMUNITY): Payer: Medicare PPO

## 2022-05-30 ENCOUNTER — Inpatient Hospital Stay (HOSPITAL_COMMUNITY)
Admission: EM | Admit: 2022-05-30 | Discharge: 2022-06-11 | DRG: 854 | Disposition: A | Discharge status: Home / Self Care | Payer: Medicare PPO | Attending: Internal Medicine | Admitting: Internal Medicine

## 2022-05-30 DIAGNOSIS — Z20822 Contact with and (suspected) exposure to covid-19: Secondary | ICD-10-CM | POA: Diagnosis present

## 2022-05-30 DIAGNOSIS — R0689 Other abnormalities of breathing: Secondary | ICD-10-CM | POA: Diagnosis not present

## 2022-05-30 DIAGNOSIS — Z6832 Body mass index (BMI) 32.0-32.9, adult: Secondary | ICD-10-CM | POA: Diagnosis not present

## 2022-05-30 DIAGNOSIS — Z90710 Acquired absence of both cervix and uterus: Secondary | ICD-10-CM

## 2022-05-30 DIAGNOSIS — I130 Hypertensive heart and chronic kidney disease with heart failure and stage 1 through stage 4 chronic kidney disease, or unspecified chronic kidney disease: Secondary | ICD-10-CM | POA: Diagnosis not present

## 2022-05-30 DIAGNOSIS — Z8553 Personal history of malignant neoplasm of renal pelvis: Secondary | ICD-10-CM | POA: Diagnosis not present

## 2022-05-30 DIAGNOSIS — D72829 Elevated white blood cell count, unspecified: Secondary | ICD-10-CM | POA: Diagnosis not present

## 2022-05-30 DIAGNOSIS — R7881 Bacteremia: Secondary | ICD-10-CM | POA: Diagnosis not present

## 2022-05-30 DIAGNOSIS — Z87440 Personal history of urinary (tract) infections: Secondary | ICD-10-CM | POA: Diagnosis not present

## 2022-05-30 DIAGNOSIS — N19 Unspecified kidney failure: Secondary | ICD-10-CM | POA: Diagnosis not present

## 2022-05-30 DIAGNOSIS — A411 Sepsis due to other specified staphylococcus: Secondary | ICD-10-CM | POA: Diagnosis not present

## 2022-05-30 DIAGNOSIS — Z86711 Personal history of pulmonary embolism: Secondary | ICD-10-CM | POA: Diagnosis not present

## 2022-05-30 DIAGNOSIS — N1330 Unspecified hydronephrosis: Secondary | ICD-10-CM | POA: Diagnosis not present

## 2022-05-30 DIAGNOSIS — N1832 Chronic kidney disease, stage 3b: Secondary | ICD-10-CM | POA: Diagnosis not present

## 2022-05-30 DIAGNOSIS — I248 Other forms of acute ischemic heart disease: Secondary | ICD-10-CM | POA: Diagnosis present

## 2022-05-30 DIAGNOSIS — R9431 Abnormal electrocardiogram [ECG] [EKG]: Secondary | ICD-10-CM | POA: Diagnosis not present

## 2022-05-30 DIAGNOSIS — F32A Depression, unspecified: Secondary | ICD-10-CM | POA: Diagnosis present

## 2022-05-30 DIAGNOSIS — N183 Chronic kidney disease, stage 3 unspecified: Secondary | ICD-10-CM | POA: Diagnosis not present

## 2022-05-30 DIAGNOSIS — J9811 Atelectasis: Secondary | ICD-10-CM | POA: Diagnosis not present

## 2022-05-30 DIAGNOSIS — R195 Other fecal abnormalities: Secondary | ICD-10-CM | POA: Diagnosis present

## 2022-05-30 DIAGNOSIS — Z87891 Personal history of nicotine dependence: Secondary | ICD-10-CM | POA: Diagnosis not present

## 2022-05-30 DIAGNOSIS — N136 Pyonephrosis: Secondary | ICD-10-CM | POA: Diagnosis present

## 2022-05-30 DIAGNOSIS — I959 Hypotension, unspecified: Secondary | ICD-10-CM | POA: Diagnosis not present

## 2022-05-30 DIAGNOSIS — Z9221 Personal history of antineoplastic chemotherapy: Secondary | ICD-10-CM

## 2022-05-30 DIAGNOSIS — D500 Iron deficiency anemia secondary to blood loss (chronic): Secondary | ICD-10-CM | POA: Diagnosis not present

## 2022-05-30 DIAGNOSIS — I2510 Atherosclerotic heart disease of native coronary artery without angina pectoris: Secondary | ICD-10-CM | POA: Diagnosis not present

## 2022-05-30 DIAGNOSIS — C680 Malignant neoplasm of urethra: Secondary | ICD-10-CM | POA: Diagnosis not present

## 2022-05-30 DIAGNOSIS — Z7901 Long term (current) use of anticoagulants: Secondary | ICD-10-CM

## 2022-05-30 DIAGNOSIS — C689 Malignant neoplasm of urinary organ, unspecified: Secondary | ICD-10-CM | POA: Diagnosis not present

## 2022-05-30 DIAGNOSIS — Z8249 Family history of ischemic heart disease and other diseases of the circulatory system: Secondary | ICD-10-CM

## 2022-05-30 DIAGNOSIS — C662 Malignant neoplasm of left ureter: Secondary | ICD-10-CM | POA: Diagnosis not present

## 2022-05-30 DIAGNOSIS — B957 Other staphylococcus as the cause of diseases classified elsewhere: Secondary | ICD-10-CM | POA: Diagnosis not present

## 2022-05-30 DIAGNOSIS — Z888 Allergy status to other drugs, medicaments and biological substances status: Secondary | ICD-10-CM

## 2022-05-30 DIAGNOSIS — R319 Hematuria, unspecified: Secondary | ICD-10-CM | POA: Diagnosis present

## 2022-05-30 DIAGNOSIS — E875 Hyperkalemia: Secondary | ICD-10-CM | POA: Diagnosis not present

## 2022-05-30 DIAGNOSIS — N189 Chronic kidney disease, unspecified: Secondary | ICD-10-CM | POA: Diagnosis not present

## 2022-05-30 DIAGNOSIS — Z79899 Other long term (current) drug therapy: Secondary | ICD-10-CM

## 2022-05-30 DIAGNOSIS — N179 Acute kidney failure, unspecified: Secondary | ICD-10-CM | POA: Diagnosis not present

## 2022-05-30 DIAGNOSIS — I129 Hypertensive chronic kidney disease with stage 1 through stage 4 chronic kidney disease, or unspecified chronic kidney disease: Secondary | ICD-10-CM | POA: Diagnosis present

## 2022-05-30 DIAGNOSIS — E669 Obesity, unspecified: Secondary | ICD-10-CM | POA: Diagnosis present

## 2022-05-30 DIAGNOSIS — R231 Pallor: Secondary | ICD-10-CM | POA: Diagnosis not present

## 2022-05-30 DIAGNOSIS — R112 Nausea with vomiting, unspecified: Secondary | ICD-10-CM | POA: Diagnosis not present

## 2022-05-30 DIAGNOSIS — F419 Anxiety disorder, unspecified: Secondary | ICD-10-CM | POA: Diagnosis present

## 2022-05-30 DIAGNOSIS — K219 Gastro-esophageal reflux disease without esophagitis: Secondary | ICD-10-CM | POA: Diagnosis present

## 2022-05-30 DIAGNOSIS — Z8616 Personal history of COVID-19: Secondary | ICD-10-CM

## 2022-05-30 DIAGNOSIS — R1111 Vomiting without nausea: Secondary | ICD-10-CM | POA: Diagnosis not present

## 2022-05-30 DIAGNOSIS — C652 Malignant neoplasm of left renal pelvis: Secondary | ICD-10-CM | POA: Diagnosis not present

## 2022-05-30 DIAGNOSIS — Z881 Allergy status to other antibiotic agents status: Secondary | ICD-10-CM

## 2022-05-30 DIAGNOSIS — R110 Nausea: Secondary | ICD-10-CM | POA: Diagnosis not present

## 2022-05-30 DIAGNOSIS — N2889 Other specified disorders of kidney and ureter: Secondary | ICD-10-CM | POA: Diagnosis not present

## 2022-05-30 DIAGNOSIS — D631 Anemia in chronic kidney disease: Secondary | ICD-10-CM | POA: Diagnosis not present

## 2022-05-30 DIAGNOSIS — I5030 Unspecified diastolic (congestive) heart failure: Secondary | ICD-10-CM | POA: Diagnosis not present

## 2022-05-30 DIAGNOSIS — Z7982 Long term (current) use of aspirin: Secondary | ICD-10-CM

## 2022-05-30 DIAGNOSIS — N390 Urinary tract infection, site not specified: Secondary | ICD-10-CM | POA: Diagnosis not present

## 2022-05-30 DIAGNOSIS — D649 Anemia, unspecified: Secondary | ICD-10-CM | POA: Diagnosis not present

## 2022-05-30 DIAGNOSIS — E785 Hyperlipidemia, unspecified: Secondary | ICD-10-CM | POA: Diagnosis present

## 2022-05-30 DIAGNOSIS — R6520 Severe sepsis without septic shock: Secondary | ICD-10-CM | POA: Diagnosis present

## 2022-05-30 DIAGNOSIS — I509 Heart failure, unspecified: Secondary | ICD-10-CM | POA: Diagnosis not present

## 2022-05-30 DIAGNOSIS — R059 Cough, unspecified: Secondary | ICD-10-CM | POA: Diagnosis not present

## 2022-05-30 LAB — COMPREHENSIVE METABOLIC PANEL
ALT: 26 U/L (ref 0–44)
AST: 29 U/L (ref 15–41)
Albumin: 2.2 g/dL — ABNORMAL LOW (ref 3.5–5.0)
Alkaline Phosphatase: 85 U/L (ref 38–126)
Anion gap: 10 (ref 5–15)
BUN: 32 mg/dL — ABNORMAL HIGH (ref 8–23)
CO2: 23 mmol/L (ref 22–32)
Calcium: 8.4 mg/dL — ABNORMAL LOW (ref 8.9–10.3)
Chloride: 106 mmol/L (ref 98–111)
Creatinine, Ser: 3.17 mg/dL — ABNORMAL HIGH (ref 0.44–1.00)
GFR, Estimated: 15 mL/min — ABNORMAL LOW (ref >60)
Glucose, Bld: 120 mg/dL — ABNORMAL HIGH (ref 70–99)
Potassium: 4 mmol/L (ref 3.5–5.1)
Sodium: 139 mmol/L (ref 135–145)
Total Bilirubin: 0.6 mg/dL (ref 0.3–1.2)
Total Protein: 6.7 g/dL (ref 6.5–8.1)

## 2022-05-30 LAB — URINALYSIS, ROUTINE W REFLEX MICROSCOPIC
Bilirubin Urine: NEGATIVE
Glucose, UA: NEGATIVE mg/dL
Ketones, ur: NEGATIVE mg/dL
Nitrite: POSITIVE — AB
Protein, ur: 100 mg/dL — AB
RBC / HPF: >50 RBC/hpf — ABNORMAL HIGH (ref 0–5)
Specific Gravity, Urine: 1.01 (ref 1.005–1.030)
WBC, UA: >50 WBC/hpf — ABNORMAL HIGH (ref 0–5)
pH: 6 (ref 5.0–8.0)

## 2022-05-30 LAB — CBC WITH DIFFERENTIAL/PLATELET
Abs Immature Granulocytes: 0.36 10*3/uL — ABNORMAL HIGH (ref 0.00–0.07)
Basophils Absolute: 0 10*3/uL (ref 0.0–0.1)
Basophils Relative: 0 %
Eosinophils Absolute: 0 10*3/uL (ref 0.0–0.5)
Eosinophils Relative: 0 %
HCT: 29.1 % — ABNORMAL LOW (ref 36.0–46.0)
Hemoglobin: 9.1 g/dL — ABNORMAL LOW (ref 12.0–15.0)
Immature Granulocytes: 2 %
Lymphocytes Relative: 3 %
Lymphs Abs: 0.6 10*3/uL — ABNORMAL LOW (ref 0.7–4.0)
MCH: 29.5 pg (ref 26.0–34.0)
MCHC: 31.3 g/dL (ref 30.0–36.0)
MCV: 94.5 fL (ref 80.0–100.0)
Monocytes Absolute: 0.7 10*3/uL (ref 0.1–1.0)
Monocytes Relative: 3 %
Neutro Abs: 19.8 10*3/uL — ABNORMAL HIGH (ref 1.7–7.7)
Neutrophils Relative %: 92 %
Platelets: 312 10*3/uL (ref 150–400)
RBC: 3.08 MIL/uL — ABNORMAL LOW (ref 3.87–5.11)
RDW: 17.4 % — ABNORMAL HIGH (ref 11.5–15.5)
WBC: 21.6 10*3/uL — ABNORMAL HIGH (ref 4.0–10.5)
nRBC: 0.1 % (ref 0.0–0.2)

## 2022-05-30 LAB — RESP PANEL BY RT-PCR (FLU A&B, COVID) ARPGX2
Influenza A by PCR: NEGATIVE
Influenza B by PCR: NEGATIVE
SARS Coronavirus 2 by RT PCR: NEGATIVE

## 2022-05-30 LAB — CK: Total CK: 40 U/L (ref 38–234)

## 2022-05-30 LAB — TYPE AND SCREEN
ABO/RH(D): O POS
Antibody Screen: NEGATIVE

## 2022-05-30 LAB — TROPONIN I (HIGH SENSITIVITY)
Troponin I (High Sensitivity): 230 ng/L (ref <18)
Troponin I (High Sensitivity): 253 ng/L (ref <18)

## 2022-05-30 LAB — POC OCCULT BLOOD, ED: Fecal Occult Bld: NEGATIVE

## 2022-05-30 LAB — PROTIME-INR
INR: 1.6 — ABNORMAL HIGH (ref 0.8–1.2)
Prothrombin Time: 18.9 seconds — ABNORMAL HIGH (ref 11.4–15.2)

## 2022-05-30 LAB — LACTIC ACID, PLASMA
Lactic Acid, Venous: 3.7 mmol/L (ref 0.5–1.9)
Lactic Acid, Venous: 1.3 mmol/L (ref 0.5–1.9)

## 2022-05-30 LAB — LIPASE, BLOOD: Lipase: 27 U/L (ref 11–51)

## 2022-05-30 MED ORDER — HEPARIN SODIUM (PORCINE) 5000 UNIT/ML IJ SOLN
5000.0000 [IU] | Freq: Three times a day (TID) | INTRAMUSCULAR | Status: DC
  Administered 2022-05-30 – 2022-06-04 (×15): 5000 [IU] via SUBCUTANEOUS
  Filled 2022-05-30 (×15): qty 1

## 2022-05-30 MED ORDER — ALBUTEROL SULFATE (2.5 MG/3ML) 0.083% IN NEBU: 2.5000 mg | INHALATION_SOLUTION | Freq: Four times a day (QID) | RESPIRATORY_TRACT | Status: DC

## 2022-05-30 MED ORDER — OXYBUTYNIN CHLORIDE 5 MG PO TABS
5.0000 mg | ORAL_TABLET | Freq: Four times a day (QID) | ORAL | Status: DC | PRN
  Administered 2022-06-01 – 2022-06-11 (×6): 5 mg via ORAL
  Filled 2022-05-30 (×6): qty 1

## 2022-05-30 MED ORDER — ALBUTEROL SULFATE (2.5 MG/3ML) 0.083% IN NEBU: 2.5000 mg | INHALATION_SOLUTION | Freq: Four times a day (QID) | RESPIRATORY_TRACT | Status: DC | PRN

## 2022-05-30 MED ORDER — VANCOMYCIN HCL 1500 MG/300ML IV SOLN
1500.0000 mg | Freq: Once | INTRAVENOUS | Status: AC
  Administered 2022-05-30: 1500 mg via INTRAVENOUS
  Filled 2022-05-30: qty 300

## 2022-05-30 MED ORDER — ACETAMINOPHEN 325 MG PO TABS
650.0000 mg | ORAL_TABLET | Freq: Four times a day (QID) | ORAL | Status: DC | PRN
  Administered 2022-05-31 – 2022-06-11 (×17): 650 mg via ORAL
  Filled 2022-05-30 (×17): qty 2

## 2022-05-30 MED ORDER — BOOST / RESOURCE BREEZE PO LIQD CUSTOM
1.0000 | Freq: Three times a day (TID) | ORAL | Status: DC
  Administered 2022-05-31 – 2022-06-11 (×6): 1 via ORAL

## 2022-05-30 MED ORDER — TRAZODONE HCL 50 MG PO TABS
150.0000 mg | ORAL_TABLET | Freq: Every day | ORAL | Status: DC
  Administered 2022-05-30 – 2022-06-10 (×12): 150 mg via ORAL
  Filled 2022-05-30 (×13): qty 1

## 2022-05-30 MED ORDER — ROSUVASTATIN CALCIUM 10 MG PO TABS
40.0000 mg | ORAL_TABLET | Freq: Every morning | ORAL | Status: DC
  Administered 2022-05-30 – 2022-06-01 (×3): 40 mg via ORAL
  Filled 2022-05-30 (×3): qty 4

## 2022-05-30 MED ORDER — CEFEPIME HCL 2 G IV SOLR
2.0000 g | Freq: Once | INTRAVENOUS | Status: AC
  Administered 2022-05-30: 2 g via INTRAVENOUS
  Filled 2022-05-30: qty 12.5

## 2022-05-30 MED ORDER — SODIUM CHLORIDE 0.9 % IV BOLUS
1000.0000 mL | Freq: Once | INTRAVENOUS | Status: AC
  Administered 2022-05-30: 1000 mL via INTRAVENOUS

## 2022-05-30 MED ORDER — HYDRALAZINE HCL 20 MG/ML IJ SOLN
10.0000 mg | Freq: Four times a day (QID) | INTRAMUSCULAR | Status: DC | PRN
  Administered 2022-06-05 – 2022-06-11 (×2): 10 mg via INTRAVENOUS
  Filled 2022-05-30 (×2): qty 1

## 2022-05-30 MED ORDER — ENSURE ENLIVE PO LIQD
237.0000 mL | Freq: Two times a day (BID) | ORAL | Status: DC
  Administered 2022-05-31 – 2022-06-03 (×5): 237 mL via ORAL

## 2022-05-30 MED ORDER — SODIUM CHLORIDE 0.9 % IV SOLN
2.0000 g | INTRAVENOUS | Status: DC
  Administered 2022-05-31 – 2022-06-01 (×2): 2 g via INTRAVENOUS
  Filled 2022-05-30 (×2): qty 12.5

## 2022-05-30 MED ORDER — SERTRALINE HCL 50 MG PO TABS
50.0000 mg | ORAL_TABLET | Freq: Every morning | ORAL | Status: DC
  Administered 2022-05-30 – 2022-06-11 (×13): 50 mg via ORAL
  Filled 2022-05-30 (×13): qty 1

## 2022-05-30 MED ORDER — VANCOMYCIN VARIABLE DOSE PER UNSTABLE RENAL FUNCTION (PHARMACIST DOSING): Status: DC

## 2022-05-30 MED ORDER — POLYETHYLENE GLYCOL 3350 17 G PO PACK: 17.0000 g | PACK | Freq: Every day | ORAL | Status: DC | PRN

## 2022-05-30 MED ORDER — EZETIMIBE 10 MG PO TABS
10.0000 mg | ORAL_TABLET | Freq: Every day | ORAL | Status: DC
  Administered 2022-05-30 – 2022-06-11 (×13): 10 mg via ORAL
  Filled 2022-05-30 (×13): qty 1

## 2022-05-30 MED ORDER — SODIUM CHLORIDE 0.9 % IV SOLN
INTRAVENOUS | Status: DC
Start: 2022-05-30 — End: 2022-06-04

## 2022-05-30 MED ORDER — ACETAMINOPHEN 650 MG RE SUPP: 650.0000 mg | Freq: Four times a day (QID) | RECTAL | Status: DC | PRN

## 2022-05-30 NOTE — Progress Notes (Signed)
PHARMACY -  BRIEF ANTIBIOTIC NOTE   Pharmacy has received consult(s) for cefepime and vancomycin from an ED provider.  The patient's profile has been reviewed for ht/wt/allergies/indication/available labs.    One time order(s) placed for cefepime 2 g + vancomycin 1500 mg  Further antibiotics/pharmacy consults should be ordered by admitting physician if indicated.                       Thank you,  Tawnya Crook, PharmD, BCPS Clinical Pharmacist 05/30/2022 10:52 AM

## 2022-05-30 NOTE — H&P (Signed)
[facility] History and Physical  [name] ENI:[id number] DOB: 1946/05/20 DOA: 05/30/2022 PCP: [name], MD  Admitted from: Home Chief Complaint: Nausea, vomiting, weakness  History of Present Illness: [name] is a 76 y.o. female with PMH significant for urothelial cancer, HTN, HLD, CAD, CHF, CKD, PE in July 2023, GERD, anxiety depression. Patient has history of urothelial cancer.  Several years ago, she had high-grade urothelial carcinoma of the right distal ureter and underwent right nephro ureterectomy.  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  It was then treated with laser ablation.  In 2022, she was found to have a small volume recurrence in the left calyceal system.  It was treated with focal ablation but also with injection of Jelmyto in the left collecting system through her nephrostomy tube.  She was started on her second round of chemotherapy which was supposed to go for 6 weeks but she was not able to continue that for more than 3 weeks because of significant pain and left ureteral obstruction. On 03/23/2022, she underwent removal of nephrostomy tube and placement of DJ stent on the left.  Subsequently she had UTI as an outpatient twice treated with antibiotics. 7/14 to 7/19, patient was hospitalized with fever, nausea, vomiting, generalized weakness.  She was noted to have sepsis secondary to left-sided pyelonephritis.  Blood culture grew staph hominis and capitis as well as Staph epidermidis.  Per ID recommendation, she was treated with IV vancomycin and was discharged on 9 days of oral Zyvox which she completed post discharge.  Ureteral stent was left in place.  On that hospitalization, patient was also found to have a small right lower lobe pulm embolism and was started on Eliquis following with long-term aspirin.  Patient states that since starting Eliquis, she  started to get hematuria. Patient was recently hospitalized 8/7 to 8/8 for low hemoglobin, FOBT positive and urinalysis with RBCs.  She underwent 2 units of blood transfusion.  [facility] and urologist Dr. Gloriann Loan were consulted.  Hemoglobin stabilized and she was discharged without intervention.  Patient was advised to stop baby aspirin and any other NSAIDs. In the interval, she saw her PCP and was resumed again on Eliquis at half dose of 2.5 mg twice daily and a baby aspirin.  She has started to notice hematuria again. She was in her usual state of health until last night.  Per husband, last night she started to feel warm and had chills.  Around 3 AM this morning, she started having nausea and vomited twice.  She started to get weak and hence EMS was called. EMS noted lobe pressure and give normal saline bolus in route to the ED.  In the ED, patient was afebrile, heart rate in 80s, had a low blood pressure in 80s, breathing on room air Labs with BUN/creatinine elevated to 32/3.17, troponin elevated to 230, lactic acid elevated to 3.7 WBC count elevated to 21.6, hemoglobin low at 9.1, glucose elevated to 120 Respiratory virus panel negative Fecal occult blood negative Chest x-ray unremarkable EKG in normal sinus rhythm with QTc 459 ms  At the time of my evaluation, patient was lying down in bed.  Alert, awake, oriented x3.  Not in distress at this time.  Husband at bedside.  History reviewed as stated above.  Review of Systems:  All systems were reviewed and were negative unless otherwise mentioned in the  HPI   Past medical history: Past Medical History:  Diagnosis Date   Anxiety    Bilateral swelling of feet 03/11/2022   bilateral feet edema much improved per pt on 03-20-2022, bilateral foot edema resolves after propping both feet up   CAD in native artery    a. cath 10/2019- mild to moderate dx >> medical therapy    Cancer ([place])    left ureteral cancer dx sept 2020   CHF (congestive heart  failure) ([place])    Chronic kidney disease 06/2019   acute renal insufficiency sees dr Dorina Hoyer, ckd stage 3 per pt   COVID 09/2020   tired sore throat nausea hair fell out loss of taste and smell x 10 days, all symptoms resolved except still has loss of taste and smell   Depression    GERD (gastroesophageal reflux disease)    Hyperlipidemia    Hypertension     Past surgical history: Past Surgical History:  Procedure Laterality Date   ABDOMINAL HYSTERECTOMY     partial   bladder-partial removal     CYSTOSCOPY W/ URETERAL STENT PLACEMENT Left 11/17/2019   Procedure: CYSTOSCOPY WITH STENT REPLACEMENT;  Surgeon: Franchot Gallo, MD;  Location: [facility];  Service: Urology;  Laterality: Left;   CYSTOSCOPY W/ URETERAL STENT PLACEMENT Left 03/23/2022   Procedure: CYSTOSCOPY WITH RETROGRADE PYELOGRAM, LEFT URETERAL STENT PLACEMENT;  Surgeon: Franchot Gallo, MD;  Location[facility];  Service: Urology;  Laterality: Left;   CYSTOSCOPY W/ URETERAL STENT REMOVAL Left 05/10/2020   Procedure: CYSTOSCOPY WITH STENT REMOVAL;  Surgeon: Franchot Gallo, MD;  Location: [facility];  Service: Urology;  Laterality: Left;   CYSTOSCOPY W/ URETERAL STENT REMOVAL Left 05/02/2021   Procedure: CYSTOSCOPY WITH STENT REMOVAL;  Surgeon: Franchot Gallo, MD;  Location: [facility];  Service: Urology;  Laterality: Left;   CYSTOSCOPY WITH RETROGRADE PYELOGRAM, URETEROSCOPY AND STENT PLACEMENT Left 06/27/2019   Procedure: CYSTOSCOPY WITH LEFT RETROGRADE PYELOGRAM, URETEROSCOPY, BIOPSY AND LEFT STENT PLACEMENT;  Surgeon: Irine Seal, MD;  Location: [facility];  Service: Urology;  Laterality: Left;   CYSTOSCOPY WITH RETROGRADE PYELOGRAM, URETEROSCOPY AND STENT PLACEMENT Left 08/18/2019   Procedure: CYSTOSCOPY, URETEROSCOPY AND STENT EXCHANGE;  Surgeon: Franchot Gallo, MD;  Location: [facility];  Service: Urology;  Laterality: Left;  6 MINS   CYSTOSCOPY WITH  RETROGRADE PYELOGRAM, URETEROSCOPY AND STENT PLACEMENT Left 11/17/2019   Procedure: CYSTOSCOPY WITH RETROGRADE PYELOGRAM, URETEROSCOPY AND STENT PLACEMENT;  Surgeon: Franchot Gallo, MD;  Location: [facility];  Service: Urology;  Laterality: Left;  22 MINS   CYSTOSCOPY WITH RETROGRADE PYELOGRAM, URETEROSCOPY AND STENT PLACEMENT Left 05/10/2020   Procedure: CYSTOSCOPY WITH RETROGRADE PYELOGRAM, URETEROSCOPY AND STENT PLACEMENT WITH URETHRAL DIALATION AND BRUSH BIOPSY;  Surgeon: Franchot Gallo, MD;  Location: [facility];  Service: Urology;  Laterality: Left;  1 HR   CYSTOSCOPY WITH RETROGRADE PYELOGRAM, URETEROSCOPY AND STENT PLACEMENT Left 09/16/2020   Procedure: CYSTOSCOPY WITH RETROGRADE PYELOGRAM, URETEROSCOPY ,  LITHOPAXY, LEFT URETERAL BRUSHING, AND STENT REPLACEMENT;  Surgeon: Franchot Gallo, MD;  Location: [facility];  Service: Urology;  Laterality: Left;   CYSTOSCOPY WITH RETROGRADE PYELOGRAM, URETEROSCOPY AND STENT PLACEMENT Left 10/24/2021   Procedure: CYSTOSCOPY WITH RETROGRADE PYELOGRAM, URETEROSCOPY, POSSIBLE URETERAL AND RENAL BIOPSIES AND STENT PLACEMENT;  Surgeon: Franchot Gallo, MD;  Location: [facility];  Service: Urology;  Laterality: Left;  1 HR   CYSTOSCOPY WITH RETROGRADE PYELOGRAM, URETEROSCOPY AND STENT PLACEMENT Left 03/09/2022   Procedure:  CYSTOSCOPY WITH ANTEGRADE PYELOGRAM,  STENT REMOVAL;  Surgeon: Franchot Gallo, MD;  Location: [facility];  Service: Urology;  Laterality: Left;   CYSTOSCOPY/URETEROSCOPY/HOLMIUM LASER/STENT PLACEMENT Left 05/02/2021   Procedure: CYSTOSCOPY/URETEROSCOPY WITH BRUSH BIOPSY/ RETROGRADE PYELOGRAM/ HOLMIUM LASER/STENT REPLACEMENT;  Surgeon: Franchot Gallo, MD;  Location: [facility];  Service: Urology;  Laterality: Left;   HOLMIUM LASER APPLICATION Left 84/10/6604   Procedure: HOLMIUM LASER APPLICATION;  Surgeon: Franchot Gallo, MD;   Location: [facility];  Service: Urology;  Laterality: Left;   HOLMIUM LASER APPLICATION Left 12/07/6008   Procedure: HOLMIUM LASER APPLICATION OF TUMORS;  Surgeon: Franchot Gallo, MD;  Location: [facility];  Service: Urology;  Laterality: Left;   IR NEPHROSTOMY PLACEMENT LEFT  07/06/2021   IR NEPHROSTOMY PLACEMENT LEFT  02/06/2022   kidney removed  2006   right    NEPHROSTOMY TUBE REMOVAL Left 03/23/2022   Procedure: NEPHROSTOMY TUBE REMOVAL;  Surgeon: Franchot Gallo, MD;  Location: [facility];  Service: Urology;  Laterality: Left;   RIGHT/LEFT HEART CATH AND CORONARY ANGIOGRAPHY N/A 11/06/2019   Procedure: RIGHT/LEFT HEART CATH AND CORONARY ANGIOGRAPHY;  Surgeon: Belva Crome, MD;  Location: [facility];  Service: Cardiovascular;  Laterality: N/A;   THULIUM LASER TURP (TRANSURETHRAL RESECTION OF PROSTATE) Left 08/18/2019   Procedure: THULIUM LASER ABLATION OF URETERAL TUMOR;  Surgeon: Franchot Gallo, MD;  Location: [facility];  Service: Urology;  Laterality: Left;   THULIUM LASER TURP (TRANSURETHRAL RESECTION OF PROSTATE) Left 11/17/2019   Procedure: THULIUM LASER of URETERAL CANCER;  Surgeon: Franchot Gallo, MD;  Location: [facility];  Service: Urology;  Laterality: Left;    Social History:  reports that she quit smoking about 18 years ago. Her smoking use included cigarettes. She has a 19.00 pack-year smoking history. She has never used smokeless tobacco. She reports that she does not drink alcohol and does not use drugs.  Allergies:  Allergies  Allergen Reactions   Erythromycin Diarrhea and Nausea And Vomiting   Lotensin [Benazepril Hcl] Cough   Nitrofurantoin Other (See Comments)   Erythromycin, Lotensin [benazepril hcl], and Nitrofurantoin   Family history:  Family History  Problem Relation Age of Onset   Hypertension Mother    Diabetes Mellitus II Sister    Hypertension Sister    Hypertension  Brother      Home Meds: Prior to Admission medications   Medication Sig Start Date End Date Taking? Authorizing Provider  acetaminophen (TYLENOL) 500 MG tablet Take 500-1,000 mg by mouth every 6 (six) hours as needed (pain).    [provider]  apixaban (ELIQUIS) 2.5 MG TABS tablet Take 1 tablet (2.5 mg total) by mouth 2 (two) times daily. 05/16/22   Mercy Riding, MD  calcium carbonate (TUMS EX) 750 MG chewable tablet Chew 1 tablet by mouth every morning.    [provider]  carvedilol (COREG) 6.25 MG tablet TAKE 1 TABLET(6.25 MG) BY MOUTH TWICE DAILY WITH A MEAL Patient taking differently: Take 6.25 mg by mouth 2 (two) times daily with a meal. 11/22/21   Chandrasekhar, Mahesh A, MD  cholecalciferol (VITAMIN D3) 25 MCG (1000 UNIT) tablet Take 1,000 Units by mouth every morning.    [provider]  ezetimibe (ZETIA) 10 MG tablet Take 1 tablet (10 mg total) by mouth daily. 12/21/21   Chandrasekhar, Mahesh A, MD  hydrALAZINE (APRESOLINE) 50 MG tablet TAKE 1 TABLET(50 MG) BY MOUTH EVERY 8 HOURS Patient not taking: Reported on  05/15/2022 02/13/22   Werner Lean, MD  HYDROcodone-acetaminophen (NORCO/VICODIN) 5-325 MG tablet Take 1 tablet by mouth every 6 (six) hours as needed for pain. Patient not taking: Reported on 05/15/2022 02/24/22   [provider]  isosorbide mononitrate (IMDUR) 30 MG 24 hr tablet Take 0.5 tablets (15 mg total) by mouth daily. Patient taking differently: Take 15 mg by mouth every morning. 12/13/21   Chandrasekhar, Terisa Starr, MD  oxybutynin (DITROPAN) 5 MG tablet Take 5 mg by mouth every 6 (six) hours as needed (bladder pain). 12/13/21   [provider]  rosuvastatin (CRESTOR) 40 MG tablet Take 1 tablet (40 mg total) by mouth daily. Patient taking differently: Take 40 mg by mouth every morning. 12/02/21   Rudean Haskell A, MD  sertraline (ZOLOFT) 50 MG tablet Take 50 mg by mouth every morning.    [provider]   traZODone (DESYREL) 150 MG tablet Take 150 mg by mouth at bedtime. 08/24/20   [provider]    Physical Exam: Vitals:   05/30/22 1230 05/30/22 1300 05/30/22 1330 05/30/22 1351  BP: 108/62 103/63 103/63   Pulse: 76 77 77   Resp: '17 20 20   '$ Temp:    97.9 F (36.6 C)  TempSrc:    Oral  SpO2: 93% 91% 91%    Wt Readings from Last 3 Encounters:  05/15/22 74.7 kg  04/25/22 80 kg  03/23/22 77.6 kg   There is no height or weight on file to calculate BMI.  General exam: Pleasant, elderly Caucasian female.  Not in physical distress Skin: No rashes, lesions or ulcers. HEENT: Atraumatic, normocephalic, no obvious bleeding Lungs: Clear to auscultation bilaterally CVS: Regular rate and rhythm, no murmur GI/Abd soft, nontender, nondistended, bowel sound present CNS: Alert, awake, oriented x3.  Slow to respond Psychiatry: Mood appropriate Extremities: No pedal edema, no calf tenderness     Consult Orders  (From admission, onward)           Start     Ordered   05/30/22 1109  Consult to hospitalist  Weakness, aki, elevated LA, elevated trop  Once       Comments: Weakness, aki, elevated LA, elevated trop  Provider:  (Not yet assigned)  Question Answer Comment  Place call to: [facility]   Reason for Consult Admit      05/30/22 1109            Labs on Admission:   CBC: Recent Labs  Lab 05/30/22 1000  WBC 21.6*  NEUTROABS 19.8*  HGB 9.1*  HCT 29.1*  MCV 94.5  PLT 413    Basic Metabolic Panel: Recent Labs  Lab 05/30/22 1000  NA 139  K 4.0  CL 106  CO2 23  GLUCOSE 120*  BUN 32*  CREATININE 3.17*  CALCIUM 8.4*    Liver Function Tests: Recent Labs  Lab 05/30/22 1000  AST 29  ALT 26  ALKPHOS 85  BILITOT 0.6  PROT 6.7  ALBUMIN 2.2*   Recent Labs  Lab 05/30/22 1000  LIPASE 27   No results for input(s): "AMMONIA" in the last 168 hours.  Cardiac Enzymes: Recent Labs  Lab 05/30/22 1000  CKTOTAL 40    BNP (last 3  results) Recent Labs    04/21/22 2220  BNP 156.6*    ProBNP (last 3 results) Recent Labs    08/10/21 0835  PROBNP 157    CBG: No results for input(s): "GLUCAP" in the last 168 hours.  Lipase  Component Value Date/Time   LIPASE 27 05/30/2022 1000     Urinalysis    Component Value Date/Time   COLORURINE YELLOW 05/15/2022 1723   APPEARANCEUR HAZY (A) 05/15/2022 1723   LABSPEC 1.012 05/15/2022 1723   PHURINE 6.0 05/15/2022 1723   GLUCOSEU NEGATIVE 05/15/2022 1723   HGBUR LARGE (A) 05/15/2022 1723   BILIRUBINUR NEGATIVE 05/15/2022 1723   KETONESUR NEGATIVE 05/15/2022 1723   PROTEINUR 30 (A) 05/15/2022 1723   NITRITE NEGATIVE 05/15/2022 1723   LEUKOCYTESUR LARGE (A) 05/15/2022 1723     Drugs of Abuse  No results found for: "LABOPIA", "COCAINSCRNUR", "LABBENZ", "AMPHETMU", "THCU", "LABBARB"    Radiological Exams on Admission: DG Chest Port 1 View  Result Date: 05/30/2022 CLINICAL DATA:  Cough. EXAM: PORTABLE CHEST 1 VIEW COMPARISON:  Radiographs 04/21/2022 and 06/28/2019.  CT 04/22/2022. FINDINGS: 1023 hours. The heart size and mediastinal contours are stable. The lungs appear clear for the degree of inspiration. There is no confluent airspace opacity, significant pleural effusion or pneumothorax. The bones appear unchanged. Telemetry leads overlie the chest. IMPRESSION: Stable radiographic appearance of the chest. No evidence of active cardiopulmonary process. Electronically Signed   By: Richardean Sale M.D.   On: 05/30/2022 10:33     ------------------------------------------------------------------------------------------------------ Assessment/Plan: Active Problems:   AKI (acute kidney injury) ([place])  High likelihood of sepsis  Presented with chills, nausea, vomiting Recent hospitalization for left pyelonephritis, coagulase negative staph bacteremia. Has leukocytosis, lactic acidosis, AKI Pending urinalysis and urine culture. Blood culture sent Started on  broad-spectrum antibiotics with IV cefepime and IV vancomycin.  Continue the same. Continue maintenance IV fluid.  Noted improvement in lactic acid on repeat labs this afternoon. Recent Labs  Lab 05/30/22 1000 05/30/22 1246  WBC 21.6*  --   LATICACIDVEN 3.7* 1.3    HypOtension Blood pressure low in 80s at presentation.  Improving gradually with IV fluid.  Continue to monitor on IV fluid Keep BP meds on hold  AKI on CKD 3B Creatinine at baseline close to 1.5.  Patient presented with creatinine elevated to 3.17 Expect improvement with IV fluid. Continue to monitor Recent Labs    03/11/22 1600 03/23/22 1222 04/21/22 1924 04/21/22 2139 04/22/22 0500 04/23/22 0346 04/24/22 0407 04/25/22 0437 04/26/22 0425 05/15/22 1617 05/16/22 0026 05/30/22 1000  BUN '15 11 12  '$ --  14  --  '21 20 20 12 12 '$ 32*  CREATININE 1.41* 1.60* 1.39* 1.26* 1.29* 1.67* 1.62* 1.38* 1.40* 1.44* 1.43* 3.17*   History of CHF PTA on Coreg 6.25 mg twice daily, Imdur 15 mg daily Meds on hold currently because of hypotension.  Once blood pressure improves, will resume low-dose Coreg.    PE in July 2023 04/22/2022, CTPA showed solitary small nonocclusive thrombus in the right lower lobe pulmonary artery at the segmental branching level.  At that time, she was started on Eliquis which she could not continue prolonged because of hematuria.  Per patient, she has been started back on aspirin and Eliquis by her PCP.  It seems hematuria has started again.  I would keep both Eliquis and aspirin on hold at this time.    Chronic anemia It seems that patient's hemoglobin was more than 12 till May 2023.  It has been gradually trending down since then.  In last hospitalization, hemoglobin was low at 7.1, received 2 units transfusion and was discharged with a hemoglobin of 11.  Patient presents with hemoglobin at 9 today..  She is clinically dehydrated.  Her true hemoglobin could be less  than 9.1.  FOBT negative.  Continue to  monitor for any evidence of bleeding Recent Labs    04/26/22 0425 05/15/22 1617 05/16/22 0026 05/16/22 1339 05/30/22 1000  HGB 8.3* 7.7* 7.1* 11.0* 9.1*  MCV 93.9 95.8 97.5 91.4 94.5  TIBC  --   --  259  --   --   IRON  --   --  38  --   --    CAD/HLD PTA on Coreg, Imdur, Crestor 40 mg daily, Zetia 10 mg daily  GERD PPI  anxiety depression PTA on trazodone, Zoloft  Urothelial cancer S/p right distal ureter and underwent right nephro ureterectomy several years ago Recurrence in left ureter in 2020 s/p focal ablation Recurrence in left calyceal system in 2022.  Was getting chemotherapy to nephrostomy tube under the care of Dr. Diona Fanti.  It was stopped because of intolerance and left ureteral obstruction S/p left ureter DJ stent 03/23/2022 Continue Ditropan  Mobility -May need PT eval  Goals of care - -  Code Status: Full Code     Diet:  Diet Order             Diet clear liquid Room service appropriate? Yes; Fluid consistency: Thin  Diet effective now                  DVT prophylaxis:  heparin injection 5,000 Units Start: 05/30/22 1400   Antimicrobials: IV cefepime, IV vancomycin Fluid: NS at 125 mill per hour Consultants: None at this time Family Communication: Husband at bedside Dispo: The patient is from: Home, pending clinical course              Anticipated d/c is to: Home  ------------------------------------------------------------------------------------- Severity of Illness: The appropriate patient status for this patient is INPATIENT. Inpatient status is judged to be reasonable and necessary in order to provide the required intensity of service to ensure the patient's safety. The patient's presenting symptoms, physical exam findings, and initial radiographic and laboratory data in the context of their chronic comorbidities is felt to place them at high risk for further clinical deterioration. Furthermore, it is not anticipated that the patient  will be medically stable for discharge from the hospital within 2 midnights of admission.   * I certify that at the point of admission it is my clinical judgment that the patient will require inpatient hospital care spanning beyond 2 midnights from the point of admission due to high intensity of service, high risk for further deterioration and high frequency of surveillance required.*   Signed, Terrilee Croak, MD [facility] 05/30/2022

## 2022-05-30 NOTE — ED Provider Notes (Signed)
[place] DEPT Provider Note   CSN: [id number] Arrival date & time: 05/30/22  7829     History  Chief Complaint  Patient presents with   Weakness   Nausea   Emesis    [name] is a 76 y.o. female.  76 year old female with prior medical history as detailed below presents for evaluation.  Patient complains of persistent and increasing weakness.  This is associated with nausea and vomiting since this morning.  Patient's husband called EMS for transport to the ED and evaluation of complaint.  Patient was noted to be hypotensive with EMS.  10 and 50 mL of normal saline was given by EMS during transport.  Patient reports that her last dose of Eliquis was yesterday.  She reports that she does not want to take the Eliquis anymore because she feels that is contributing to her symptoms.  She denies hematuria.  She denies obvious blood in stool.  She does have black stools which she contributes secondary to her chronic use of iron.  She denies chest pain or shortness of breath.  She does report recent chills and sweats that occur at night.  This is been going on for several days per her report.      The history is provided by the patient and medical records.  Weakness Severity:  Moderate Onset quality:  Gradual Duration:  4 days Timing:  Constant Progression:  Worsening Chronicity:  New Associated symptoms: vomiting   Emesis      Home Medications Prior to Admission medications   Medication Sig Start Date End Date Taking? Authorizing Provider  acetaminophen (TYLENOL) 500 MG tablet Take 500-1,000 mg by mouth every 6 (six) hours as needed (pain).    [provider]  apixaban (ELIQUIS) 2.5 MG TABS tablet Take 1 tablet (2.5 mg total) by mouth 2 (two) times daily. 05/16/22   Mercy Riding, MD  calcium carbonate (TUMS EX) 750 MG chewable tablet Chew 1 tablet by mouth every morning.    [provider]  carvedilol (COREG) 6.25 MG  tablet TAKE 1 TABLET(6.25 MG) BY MOUTH TWICE DAILY WITH A MEAL Patient taking differently: Take 6.25 mg by mouth 2 (two) times daily with a meal. 11/22/21   Chandrasekhar, Mahesh A, MD  cholecalciferol (VITAMIN D3) 25 MCG (1000 UNIT) tablet Take 1,000 Units by mouth every morning.    [provider]  ezetimibe (ZETIA) 10 MG tablet Take 1 tablet (10 mg total) by mouth daily. 12/21/21   Chandrasekhar, Lyda Kalata A, MD  hydrALAZINE (APRESOLINE) 50 MG tablet TAKE 1 TABLET(50 MG) BY MOUTH EVERY 8 HOURS Patient not taking: Reported on 05/15/2022 02/13/22   Werner Lean, MD  HYDROcodone-acetaminophen (NORCO/VICODIN) 5-325 MG tablet Take 1 tablet by mouth every 6 (six) hours as needed for pain. Patient not taking: Reported on 05/15/2022 02/24/22   [provider]  isosorbide mononitrate (IMDUR) 30 MG 24 hr tablet Take 0.5 tablets (15 mg total) by mouth daily. Patient taking differently: Take 15 mg by mouth every morning. 12/13/21   Chandrasekhar, Terisa Starr, MD  oxybutynin (DITROPAN) 5 MG tablet Take 5 mg by mouth every 6 (six) hours as needed (bladder pain). 12/13/21   [provider]  rosuvastatin (CRESTOR) 40 MG tablet Take 1 tablet (40 mg total) by mouth daily. Patient taking differently: Take 40 mg by mouth every morning. 12/02/21   Rudean Haskell A, MD  sertraline (ZOLOFT) 50 MG tablet Take 50 mg by mouth every morning.  [provider]  traZODone (DESYREL) 150 MG tablet Take 150 mg by mouth at bedtime. 08/24/20   [provider]      Allergies    Erythromycin, Lotensin [benazepril hcl], and Nitrofurantoin    Review of Systems   Review of Systems  Gastrointestinal:  Positive for vomiting.  Neurological:  Positive for weakness.  All other systems reviewed and are negative.   Physical Exam Updated Vital Signs BP (!) 94/50   Pulse 86   Temp 98 F (36.7 C)   Resp 18   SpO2 95%  Physical Exam Vitals and nursing note reviewed.  Constitutional:       General: She is not in acute distress.    Appearance: Normal appearance. She is well-developed.  HENT:     Head: Normocephalic and atraumatic.     Mouth/Throat:     Mouth: Mucous membranes are dry.  Eyes:     Conjunctiva/sclera: Conjunctivae normal.     Pupils: Pupils are equal, round, and reactive to light.  Cardiovascular:     Rate and Rhythm: Normal rate and regular rhythm.     Heart sounds: Normal heart sounds.  Pulmonary:     Effort: Pulmonary effort is normal. No respiratory distress.     Breath sounds: Normal breath sounds.  Abdominal:     General: There is no distension.     Palpations: Abdomen is soft.     Tenderness: There is no abdominal tenderness.  Musculoskeletal:        General: No deformity. Normal range of motion.     Cervical back: Normal range of motion and neck supple.  Skin:    General: Skin is warm and dry.  Neurological:     General: No focal deficit present.     Mental Status: She is alert and oriented to person, place, and time.     ED Results / Procedures / Treatments   Labs (all labs ordered are listed, but only abnormal results are displayed) Labs Reviewed  CBC WITH DIFFERENTIAL/PLATELET - Abnormal; Notable for the following components:      Result Value   WBC 21.6 (*)    RBC 3.08 (*)    Hemoglobin 9.1 (*)    HCT 29.1 (*)    RDW 17.4 (*)    Neutro Abs 19.8 (*)    Lymphs Abs 0.6 (*)    Abs Immature Granulocytes 0.36 (*)    All other components within normal limits  CULTURE, BLOOD (ROUTINE X 2)  CULTURE, BLOOD (ROUTINE X 2)  RESP PANEL BY RT-PCR (FLU A&B, COVID) ARPGX2  URINALYSIS, ROUTINE W REFLEX MICROSCOPIC  COMPREHENSIVE METABOLIC PANEL  LACTIC ACID, PLASMA  LACTIC ACID, PLASMA  PROTIME-INR  LIPASE, BLOOD  CK  POC OCCULT BLOOD, ED  TYPE AND SCREEN  TROPONIN I (HIGH SENSITIVITY)    EKG EKG Interpretation  Date/Time:  Tuesday May 30 2022 10:02:02 EDT Ventricular Rate:  82 PR Interval:  136 QRS Duration: 85 QT  Interval:  393 QTC Calculation: 459 R Axis:   48 Text Interpretation: Sinus rhythm RSR' in V1 or V2, probably normal variant Confirmed by Dene Gentry [phone]) on 05/30/2022 10:05:29 AM  Radiology No results found.  Procedures Procedures    Medications Ordered in ED Medications  sodium chloride 0.9 % bolus 1,000 mL (has no administration in time range)  ceFEPIme (MAXIPIME) 2 g in sodium chloride 0.9 % 100 mL IVPB (has no administration in time range)  vancomycin (VANCOREADY) IVPB 1500 mg/300 mL (has no administration  in time range)  sodium chloride 0.9 % bolus 1,000 mL (1,000 mLs Intravenous New Bag/Given 05/30/22 1005)    ED Course/ Medical Decision Making/ A&P                           Medical Decision Making Amount and/or Complexity of Data Reviewed Labs: ordered. Radiology: ordered.  Risk Prescription drug management. Decision regarding hospitalization.    Medical Screen Complete  This patient presented to the ED with complaint of weakness, fatigue, hypotension.  This complaint involves an extensive number of treatment options. The initial differential diagnosis includes, but is not limited to, dehydration, AKI, metabolic abnormality, infection, etc.  This presentation is: Acute, Chronic, Self-Limited, Previously Undiagnosed, Uncertain Prognosis, Complicated, Systemic Symptoms, and Threat to Life/Bodily Function  Patient is presenting with reported hypotension, malaise, fatigue.  Exam is suggestive of significant dehydration with noted hypotension.  Lab work is concerning for elevated lactic acid, elevated white blood cell count, elevated troponin.  Patient also with AKI with elevated BUN and creatinine.  IV fluid resuscitation initiated here in the ED.  Spectrum antibiotics initiated here in the ED.  Patient would benefit from admission for further work-up and treatment.    Hospitalist service is aware of case and will evaluate for admission.    Additional  history obtained:  Additional history obtained from EMS External records from outside sources obtained and reviewed including prior ED visits and prior Inpatient records.    Lab Tests:  I ordered and personally interpreted labs.  The pertinent results include: CBC, CMP, lactic acid, cultures, urine, CK, INR, stool guaiac, covid, flu   Imaging Studies ordered:  I ordered imaging studies including CXR  I independently visualized and interpreted obtained imaging which showed NAD I agree with the radiologist interpretation.   Cardiac Monitoring:  The patient was maintained on a cardiac monitor.  I personally viewed and interpreted the cardiac monitor which showed an underlying rhythm of: NSR   Medicines ordered:  I ordered medication including IV fluids, broad spectrum antibiotics for dehydration, AKI, suspected infection Reevaluation of the patient after these medicines showed that the patient: improved    Problem List / ED Course:  Pretension, AKI, elevated troponin, elevated white count   Reevaluation:  After the interventions noted above, I reevaluated the patient and found that they have: improved  Disposition:  After consideration of the diagnostic results and the patients response to treatment, I feel that the patent would benefit from admission.    CRITICAL CARE Performed by: Valarie Merino   Total critical care time: 30 minutes  Critical care time was exclusive of separately billable procedures and treating other patients.  Critical care was necessary to treat or prevent imminent or life-threatening deterioration.  Critical care was time spent personally by me on the following activities: development of treatment plan with patient and/or surrogate as well as nursing, discussions with consultants, evaluation of patient's response to treatment, examination of patient, obtaining history from patient or surrogate, ordering and performing treatments and  interventions, ordering and review of laboratory studies, ordering and review of radiographic studies, pulse oximetry and re-evaluation of patient's condition.         Final Clinical Impression(s) / ED Diagnoses Final diagnoses:  AKI (acute kidney injury) ([place])  Hypotension, unspecified hypotension type  Leukocytosis, unspecified type    Rx / DC Orders ED Discharge Orders     None         Jorge Retz,  Wallis Bamberg, MD 05/30/22 [phone]

## 2022-05-30 NOTE — ED Triage Notes (Signed)
EMS reports from home, CA Pt, c/o weakness when walking, with Nausea and emesis sine this morning.  BP 98/50 HR 91 RR 35 Sp02 96 @ 2 lts CBG 129  20ga R forearm, 357m NS enroute.

## 2022-05-30 NOTE — Progress Notes (Signed)
Pharmacy Antibiotic Note  [name] is a 76 y.o. female admitted on 05/30/2022 with AKI and high likelihood for sepsis. Patient with urothelial cancer, history of recurrence. Patient had been on chemotherapy to nephrostomy tube but was stopped because of intolerance and left ureteral obstruction. She has recent history of left pyelonephritis and bacteremia. She has been on Eliquis for PE found during hospitalization in July; it was held for hematuria but was resumed by PCP and now patient reporting increase in hematuria. Pharmacy has been consulted for vancomycin and cefepime dosing.  CrCl 15 ml/min on admission, SCr 3.17 which is up from her baseline ~ 1.3-1.6.  Plan: -Vancomycin 1500 mg IV x 1 given in the ED. Based on current renal function, expect q48h regimen. Will plan to check vancomycin level prior to further doses and pending trajectory of renal function. -Cefepime 2 g IV q24h -Pharmacy to continue to follow renal function, cultures and clinical progress for dose adjustments and de-escalation as indicated  Height: '5\' 3"'$  (160 cm) Weight: 74.4 kg (164 lb) IBW/kg (Calculated) : 52.4  Temp (24hrs), Avg:98 F (36.7 C), Min:97.9 F (36.6 C), Max:98 F (36.7 C)  Recent Labs  Lab 05/30/22 1000 05/30/22 1246  WBC 21.6*  --   CREATININE 3.17*  --   LATICACIDVEN 3.7* 1.3    Estimated Creatinine Clearance: 15.3 mL/min (A) (by C-G formula based on SCr of 3.17 mg/dL (H)).    Allergies  Allergen Reactions   Erythromycin Diarrhea and Nausea And Vomiting   Lotensin [Benazepril Hcl] Cough   Nitrofurantoin Other (See Comments)    Antimicrobials this admission: Cefepime 8/22 >> Vancomycin 8/22 >>  Dose adjustments this admission: NA  Microbiology results: 8/22 BCx: pending 8/22 UCx: pending   Thank you for allowing pharmacy to be a part of this patient's care.  Tawnya Crook, PharmD, BCPS Clinical Pharmacist 05/30/2022 3:16 PM

## 2022-05-30 NOTE — ED Notes (Addendum)
Critical Lab Value  Lactic 3.7  Troponin 230 Reported to [facility]

## 2022-05-31 DIAGNOSIS — N179 Acute kidney failure, unspecified: Secondary | ICD-10-CM | POA: Diagnosis not present

## 2022-05-31 LAB — BASIC METABOLIC PANEL
Anion gap: 8 (ref 5–15)
BUN: 35 mg/dL — ABNORMAL HIGH (ref 8–23)
CO2: 20 mmol/L — ABNORMAL LOW (ref 22–32)
Calcium: 7.8 mg/dL — ABNORMAL LOW (ref 8.9–10.3)
Chloride: 114 mmol/L — ABNORMAL HIGH (ref 98–111)
Creatinine, Ser: 2.57 mg/dL — ABNORMAL HIGH (ref 0.44–1.00)
GFR, Estimated: 19 mL/min — ABNORMAL LOW (ref >60)
Glucose, Bld: 92 mg/dL (ref 70–99)
Potassium: 4.6 mmol/L (ref 3.5–5.1)
Sodium: 142 mmol/L (ref 135–145)

## 2022-05-31 LAB — BLOOD CULTURE ID PANEL (REFLEXED) - BCID2

## 2022-05-31 LAB — CBC
HCT: 24.5 % — ABNORMAL LOW (ref 36.0–46.0)
Hemoglobin: 7.4 g/dL — ABNORMAL LOW (ref 12.0–15.0)
MCH: 29.4 pg (ref 26.0–34.0)
MCHC: 30.2 g/dL (ref 30.0–36.0)
MCV: 97.2 fL (ref 80.0–100.0)
Platelets: 299 10*3/uL (ref 150–400)
RBC: 2.52 MIL/uL — ABNORMAL LOW (ref 3.87–5.11)
RDW: 18 % — ABNORMAL HIGH (ref 11.5–15.5)
WBC: 25.7 10*3/uL — ABNORMAL HIGH (ref 4.0–10.5)
nRBC: 0 % (ref 0.0–0.2)

## 2022-05-31 LAB — PHOSPHORUS: Phosphorus: 4.6 mg/dL (ref 2.5–4.6)

## 2022-05-31 LAB — MAGNESIUM: Magnesium: 1.8 mg/dL (ref 1.7–2.4)

## 2022-05-31 NOTE — Progress Notes (Signed)
Initial Nutrition Assessment  INTERVENTION:   -Ensure Plus High Protein po BID, each supplement provides 350 kcal and 20 grams of protein.   -Multivitamin with minerals daily  NUTRITION DIAGNOSIS:   Increased nutrient needs related to cancer and cancer related treatments as evidenced by estimated needs.  GOAL:   Patient will meet greater than or equal to 90% of their needs  MONITOR:   PO intake, Supplement acceptance, Labs, Weight trends, I & O's  REASON FOR ASSESSMENT:   Malnutrition Screening Tool    ASSESSMENT:   76 y.o. female with PMH significant for urothelial cancer, HTN, HLD, CAD, CHF, CKD, PE in July 2023, GERD, anxiety depression.  Patient has history of urothelial cancer.  Several years ago, she had high-grade urothelial carcinoma of the right distal ureter and underwent right nephro ureterectomy.  Patient in room, family at bedside. Pt reports she has not been eating well d/t N/V that has lasted "a long time". Per chart review, has been having symptoms since at least July. Was on clear liquid diet but now on heart healthy. Pt ate a good breakfast of eggs, grits and toast. She is tolerating this and food and liquids are staying down. She would like to continue to drink Ensure, drinks these at home. States she is fine continuing heart healthy diet, does not feel limited by diet.  Per weight records, pt has lost 6 lbs since 6/15 (3% wt loss x 2.5 months, insignificant for time frame).   Medications reviewed.  Labs reviewed.  NUTRITION - FOCUSED PHYSICAL EXAM:  Flowsheet Row Most Recent Value  Orbital Region No depletion  Upper Arm Region No depletion  Thoracic and Lumbar Region No depletion  Buccal Region No depletion  Temple Region No depletion  Clavicle Bone Region Mild depletion  Clavicle and Acromion Bone Region Mild depletion  Scapular Bone Region No depletion  Dorsal Hand No depletion  Patellar Region No depletion  Anterior Thigh Region No depletion   Posterior Calf Region No depletion  Edema (RD Assessment) None  Hair Reviewed  Eyes Reviewed  Mouth Reviewed  Skin Reviewed       Diet Order:   Diet Order             Diet Heart Room service appropriate? Yes; Fluid consistency: Thin  Diet effective now                   EDUCATION NEEDS:   No education needs have been identified at this time  Skin:  Skin Assessment: Reviewed RN Assessment  Last BM:  8/23- type 6  Height:   Ht Readings from Last 1 Encounters:  05/30/22 '5\' 3"'$  (1.6 m)    Weight:   Wt Readings from Last 1 Encounters:  05/30/22 74.4 kg    BMI:  Body mass index is 29.05 kg/m.  Estimated Nutritional Needs:   Kcal:  1850-2050  Protein:  85-100g  Fluid:  2L/day  Clayton Bibles, MS, RD, LDN Inpatient Clinical Dietitian Contact information available via Amion

## 2022-05-31 NOTE — Progress Notes (Signed)
PHARMACY - PHYSICIAN COMMUNICATION CRITICAL VALUE ALERT - BLOOD CULTURE IDENTIFICATION (BCID)  [name] is an 76 y.o. female who presented to [facility] on [facility]/30/2022 with a chief complaint of chills, N/V, AKI, sepsis  Assessment:  1 of 4 bottles from BCx's growing staph species which is generally regarded as a contaminant  Name of physician (or Provider) Contacted: [place]  Current antibiotics: Vanc/cefepime  Changes to prescribed antibiotics recommended:  Continue cefepime, stop vanc  Results for orders placed or performed during the hospital encounter of 05/30/22  Blood Culture ID Panel (Reflexed) (Collected: 05/30/2022 10:15 AM)  Result Value Ref Range   Enterococcus faecalis NOT DETECTED NOT DETECTED   Enterococcus Faecium NOT DETECTED NOT DETECTED   Listeria monocytogenes NOT DETECTED NOT DETECTED   Staphylococcus species DETECTED (A) NOT DETECTED   Staphylococcus aureus (BCID) NOT DETECTED NOT DETECTED   Staphylococcus epidermidis NOT DETECTED NOT DETECTED   Staphylococcus lugdunensis NOT DETECTED NOT DETECTED   Streptococcus species NOT DETECTED NOT DETECTED   Streptococcus agalactiae NOT DETECTED NOT DETECTED   Streptococcus pneumoniae NOT DETECTED NOT DETECTED   Streptococcus pyogenes NOT DETECTED NOT DETECTED   A.calcoaceticus-baumannii NOT DETECTED NOT DETECTED   Bacteroides fragilis NOT DETECTED NOT DETECTED   Enterobacterales NOT DETECTED NOT DETECTED   Enterobacter cloacae complex NOT DETECTED NOT DETECTED   Escherichia coli NOT DETECTED NOT DETECTED   Klebsiella aerogenes NOT DETECTED NOT DETECTED   Klebsiella oxytoca NOT DETECTED NOT DETECTED   Klebsiella pneumoniae NOT DETECTED NOT DETECTED   Proteus species NOT DETECTED NOT DETECTED   Salmonella species NOT DETECTED NOT DETECTED   Serratia marcescens NOT DETECTED NOT DETECTED   Haemophilus influenzae NOT DETECTED NOT DETECTED   Neisseria meningitidis NOT DETECTED NOT DETECTED   Pseudomonas aeruginosa  NOT DETECTED NOT DETECTED   Stenotrophomonas maltophilia NOT DETECTED NOT DETECTED   Candida albicans NOT DETECTED NOT DETECTED   Candida auris NOT DETECTED NOT DETECTED   Candida glabrata NOT DETECTED NOT DETECTED   Candida krusei NOT DETECTED NOT DETECTED   Candida parapsilosis NOT DETECTED NOT DETECTED   Candida tropicalis NOT DETECTED NOT DETECTED   Cryptococcus neoformans/gattii NOT DETECTED NOT DETECTED    [name] 05/31/2022  12:06 PM

## 2022-05-31 NOTE — Progress Notes (Signed)
Mobility Specialist - Progress Note   05/31/22 1300  Mobility  Activity Refused mobility   Pt refused mobility, no reason why. Will check back in tomorrow as requested by Pt.   Roderick Pee Mobility Specialist

## 2022-05-31 NOTE — Progress Notes (Signed)
PROGRESS NOTE  [name]  DOB: Oct 02, 1946  PCP: [name], MD DPO:[id number]  DOA: 05/30/2022  LOS: 1 day  Hospital Day: 2  Brief narrative: [name] is a 76 y.o. female with PMH significant for urothelial cancer, HTN, HLD, CAD, CHF, CKD, PE in July 2023, GERD, anxiety depression. Patient has history of urothelial cancer.  Several years ago, she had high-grade urothelial carcinoma of the right distal ureter and underwent right nephro ureterectomy.  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  It was then treated with laser ablation.  In 2022, she was found to have a small volume recurrence in the left calyceal system.  It was treated with focal ablation but also with injection of Jelmyto in the left collecting system through her nephrostomy tube.  She was started on her second round of chemotherapy which was supposed to go for 6 weeks but she was not able to continue that for more than 3 weeks because of significant pain and left ureteral obstruction. On 03/23/2022, she underwent removal of nephrostomy tube and placement of DJ stent on the left.  Subsequently she had UTI as an outpatient twice treated with antibiotics. 7/14 to 7/19, patient was hospitalized with fever, nausea, vomiting, generalized weakness.  She was noted to have sepsis secondary to left-sided pyelonephritis.  Blood culture grew staph hominis and capitis as well as Staph epidermidis.  Per ID recommendation, she was treated with IV vancomycin and was discharged on 9 days of oral Zyvox which she completed post discharge.  Ureteral stent was left in place.  On that hospitalization, patient was also found to have a small right lower lobe pulm embolism and was started on Eliquis following with long-term aspirin.  Patient states that since starting Eliquis, she started to get hematuria. Patient was recently hospitalized 8/7 to 8/8  for low hemoglobin, FOBT positive and urinalysis with RBCs.  She underwent 2 units of blood transfusion.  [facility] and urologist Dr. Gloriann Loan were consulted.  Hemoglobin stabilized and she was discharged without intervention.  Patient was advised to stop baby aspirin and any other NSAIDs. In the interval, she saw her PCP and was resumed again on Eliquis at half dose of 2.5 mg twice daily and a baby aspirin.  She has started to notice hematuria again. She was in her usual state of health until last night.  Per husband, last night she started to feel warm and had chills.  Around 3 AM this morning, she started having nausea and vomited twice.  She started to get weak and hence EMS was called. EMS noted lobe pressure and give normal saline bolus in route to the ED.  In the ED, patient was afebrile, heart rate in 80s, had a low blood pressure in 80s, breathing on room air Labs with BUN/creatinine elevated to 32/3.17, troponin elevated to 230, lactic acid elevated to 3.7 WBC count elevated to 21.6, hemoglobin low at 9.1, glucose elevated to 120 Urinalysis with cloudy yellow urine with moderate amount of hemoglobin, large amount of leukocytes, positive nitrite, many bacteria EKG in normal sinus rhythm with QTc 459 ms Admitted to hospitalist service Urine cultures sent. Blood culture is showing preliminary growth of gram-positive cocci in anaerobic bottle only. See below for details   Subjective: Patient was seen and examined this morning.  Propped up in bed.  Not in distress.  Feels better than at  presentation.  Husband at bedside.  Patient's sister-in-law was on the phone and had multiple questions.  It seems she has a lot of unanswered questions that need to be discussed with patient's urologist Dr. Diona Fanti.  Assessment/Plan: Active Problems:   AKI (acute kidney injury) ([place])  Sepsis -POA UTI in the presence of left DJ stent Presented with chills, nausea, vomiting, leukocytosis, lactic acidosis and  AKI Recent hospitalization for left pyelonephritis, coagulase negative staph bacteremia. Urinalysis on admission positive for leukocytes and nitrite and many bacteria.  Pending urine culture report.  Pending blood culture report Currently on broad-spectrum antibiotics with IV cefepime.   Clinically improving.  WBC count worsening however.  Lactic acid level improved.  Continue to monitor. Recent Labs  Lab 05/30/22 1000 05/30/22 1246 05/31/22 0626  WBC 21.6*  --  25.7*  LATICACIDVEN 3.7* 1.3  --     HypOtension Blood pressure low in 80s at presentation.  Improving gradually with IV fluid.  Continue to monitor on IV fluid Keep BP meds on hold  AKI on CKD 3B Creatinine at baseline close to 1.5.  Patient presented with creatinine elevated to 3.17 Gradually improving with IV fluid Continue to monitor Recent Labs    03/23/22 1222 04/21/22 1924 04/21/22 2139 04/22/22 0500 04/23/22 0346 04/24/22 0407 04/25/22 0437 04/26/22 0425 05/15/22 1617 05/16/22 0026 05/30/22 1000 05/31/22 0626  BUN 11 12  --  14  --  '21 20 20 12 12 '$ 32* 35*  CREATININE 1.60* 1.39* 1.26* 1.29* 1.67* 1.62* 1.38* 1.40* 1.44* 1.43* 3.17* 2.57*   History of CHF PTA on Coreg 6.25 mg twice daily, Imdur 15 mg daily Meds on hold currently because of hypotension.  Once blood pressure improves, will resume low-dose Coreg.    PE in July 2023 04/22/2022, CTPA showed solitary small nonocclusive thrombus in the right lower lobe pulmonary artery at the segmental branching level.  At that time, she was started on Eliquis which she could not continue prolonged because of hematuria.  Per patient, she has been started back on aspirin and Eliquis by her PCP.  It seems hematuria has started again.  I would keep both Eliquis and aspirin on hold at this time.    Chronic anemia It seems that patient's hemoglobin was more than 12 till May 2023.  It has been gradually trending down since then.  In last hospitalization, hemoglobin was  low at 7.1, received 2 units transfusion and was discharged with a hemoglobin of 11.  Patient presented with hemoglobin at 9 this time.  No active bleeding but hemoglobin dropped to 7.4 this morning.  FOBT negative.  Continue to monitor.  May need transfusion if hemoglobin is less than 7. Recent Labs    05/15/22 1617 05/16/22 0026 05/16/22 1339 05/30/22 1000 05/31/22 0626  HGB 7.7* 7.1* 11.0* 9.1* 7.4*  MCV 95.8 97.5 91.4 94.5 97.2  TIBC  --  259  --   --   --   IRON  --  38  --   --   --    CAD/HLD PTA on Coreg, Imdur, Crestor 40 mg daily, Zetia 10 mg daily  GERD PPI  anxiety depression PTA on trazodone, Zoloft  Urothelial cancer S/p right distal ureter and underwent right nephro ureterectomy several years ago Recurrence in left ureter in 2020 s/p focal ablation Recurrence in left calyceal system in 2022.  Was getting chemotherapy to nephrostomy tube under the care of Dr. Diona Fanti.  It was stopped because of intolerance and left ureteral obstruction  S/p left ureter DJ stent 03/23/2022 Continue Ditropan  Mobility PT eval.  Goals of care -  Code Status: Full Code    Diet:  Diet Order             Diet Heart Room service appropriate? Yes; Fluid consistency: Thin  Diet effective now                  DVT prophylaxis:  heparin injection 5,000 Units Start: 05/30/22 1400   Antimicrobials: IV cefepime, IV vancomycin Fluid: Minimize NS rate to 50 mill per hour.   Consultants: None at this time Family Communication: Husband at bedside.  Sister in law on the phone.  Status is: Inpatient  Continue in-hospital care because: Pending culture report, IV antibiotics Level of care: Telemetry   Dispo: The patient is from: Home              Anticipated d/c is to: Hopefully, pending PT eval              Patient currently is not medically stable to d/c.   Difficult to place patient No     Infusions:   sodium chloride 125 mL/hr at 05/31/22 1248   ceFEPime (MAXIPIME) IV  2 g (05/31/22 1249)    Scheduled Meds:  ezetimibe  10 mg Oral Daily   feeding supplement  1 Container Oral TID BM   feeding supplement  237 mL Oral BID BM   heparin  5,000 Units Subcutaneous Q8H   rosuvastatin  40 mg Oral q morning   sertraline  50 mg Oral q morning   traZODone  150 mg Oral QHS    PRN meds: acetaminophen **OR** acetaminophen, albuterol, hydrALAZINE, oxybutynin, polyethylene glycol   Antimicrobials: Anti-infectives (From admission, onward)    Start     Dose/Rate Route Frequency Ordered Stop   05/31/22 1100  ceFEPIme (MAXIPIME) 2 g in sodium chloride 0.9 % 100 mL IVPB        2 g 200 mL/hr over 30 Minutes Intravenous Every 24 hours 05/30/22 1518     05/30/22 1517  vancomycin variable dose per unstable renal function (pharmacist dosing)  Status:  Discontinued         Does not apply See admin instructions 05/30/22 1518 05/31/22 1214   05/30/22 1030  ceFEPIme (MAXIPIME) 2 g in sodium chloride 0.9 % 100 mL IVPB        2 g 200 mL/hr over 30 Minutes Intravenous  Once 05/30/22 1024 05/30/22 1125   05/30/22 1030  vancomycin (VANCOREADY) IVPB 1500 mg/300 mL        1,500 mg 150 mL/hr over 120 Minutes Intravenous  Once 05/30/22 1024 05/30/22 1242       Objective: Vitals:   05/31/22 0118 05/31/22 0426  BP: 120/63 120/64  Pulse: 62 69  Resp: 19 20  Temp: 97.6 F (36.4 C) 98.1 F (36.7 C)  SpO2: 98% 96%    Intake/Output Summary (Last 24 hours) at 05/31/2022 1439 Last data filed at 05/31/2022 0900 Gross per 24 hour  Intake 2003.6 ml  Output 1150 ml  Net 853.6 ml   Filed Weights   05/30/22 1427  Weight: 74.4 kg   Weight change:  Body mass index is 29.05 kg/m.   Physical Exam: General exam: Pleasant, elderly Caucasian female.  Not in physical distress Skin: No rashes, lesions or ulcers. HEENT: Atraumatic, normocephalic, no obvious bleeding Lungs: Clear to auscultation bilaterally CVS: Regular rate and rhythm, no murmur GI/Abd soft, no abdominal  tenderness,  bowel sound present CNS: Alert, awake, oriented x3 Psychiatry: Mood appropriate Extremities: No pedal edema, no calf tenderness  Data Review: I have personally reviewed the laboratory data and studies available.  F/u labs ordered Unresulted Labs (From admission, onward)     Start     Ordered   06/01/22 0500  ABO/Rh  Once,   R        05/31/22 1437   05/31/22 4835  Basic metabolic panel  Daily at 5am,   R      05/30/22 1128   05/31/22 0500  CBC  Daily at 5am,   R      05/30/22 1128   05/30/22 1417  Urine Culture  (Urine Culture)  Once,   R       Question:  Indication  Answer:  Sepsis   05/30/22 1416            Signed, Terrilee Croak, MD [facility] 05/31/2022

## 2022-06-01 ENCOUNTER — Inpatient Hospital Stay (HOSPITAL_COMMUNITY): Payer: Medicare PPO

## 2022-06-01 ENCOUNTER — Other Ambulatory Visit (HOSPITAL_COMMUNITY): Payer: Self-pay

## 2022-06-01 DIAGNOSIS — R9431 Abnormal electrocardiogram [ECG] [EKG]: Secondary | ICD-10-CM | POA: Diagnosis not present

## 2022-06-01 DIAGNOSIS — N179 Acute kidney failure, unspecified: Secondary | ICD-10-CM | POA: Diagnosis not present

## 2022-06-01 LAB — ECHOCARDIOGRAM COMPLETE
AR max vel: 1.89 cm2
AV Area VTI: 1.79 cm2
AV Area mean vel: 1.89 cm2
AV Mean grad: 7.5 mmHg
AV Peak grad: 13.6 mmHg
Ao pk vel: 1.85 m/s
Area-P 1/2: 3.99 cm2
Calc EF: 63.4 %
Height: 63 in
MV VTI: 1.72 cm2
S' Lateral: 2.9 cm
Single Plane A2C EF: 61.4 %
Single Plane A4C EF: 65.8 %
Weight: 2624 oz

## 2022-06-01 LAB — URINE CULTURE: Culture: 30000 — AB

## 2022-06-01 LAB — BASIC METABOLIC PANEL
Anion gap: 7 (ref 5–15)
BUN: 37 mg/dL — ABNORMAL HIGH (ref 8–23)
CO2: 18 mmol/L — ABNORMAL LOW (ref 22–32)
Calcium: 7.6 mg/dL — ABNORMAL LOW (ref 8.9–10.3)
Chloride: 112 mmol/L — ABNORMAL HIGH (ref 98–111)
Creatinine, Ser: 2.83 mg/dL — ABNORMAL HIGH (ref 0.44–1.00)
GFR, Estimated: 17 mL/min — ABNORMAL LOW (ref >60)
Glucose, Bld: 102 mg/dL — ABNORMAL HIGH (ref 70–99)
Potassium: 4.2 mmol/L (ref 3.5–5.1)
Sodium: 137 mmol/L (ref 135–145)

## 2022-06-01 LAB — CBC
HCT: 25.9 % — ABNORMAL LOW (ref 36.0–46.0)
Hemoglobin: 7.9 g/dL — ABNORMAL LOW (ref 12.0–15.0)
MCH: 29.4 pg (ref 26.0–34.0)
MCHC: 30.5 g/dL (ref 30.0–36.0)
MCV: 96.3 fL (ref 80.0–100.0)
Platelets: 323 10*3/uL (ref 150–400)
RBC: 2.69 MIL/uL — ABNORMAL LOW (ref 3.87–5.11)
RDW: 18.2 % — ABNORMAL HIGH (ref 11.5–15.5)
WBC: 15.5 10*3/uL — ABNORMAL HIGH (ref 4.0–10.5)
nRBC: 0 % (ref 0.0–0.2)

## 2022-06-01 MED ORDER — CALCIUM CARBONATE ANTACID 500 MG PO CHEW
1.0000 | CHEWABLE_TABLET | Freq: Three times a day (TID) | ORAL | Status: DC
  Administered 2022-06-01 – 2022-06-11 (×26): 200 mg via ORAL
  Filled 2022-06-01 (×27): qty 1

## 2022-06-01 MED ORDER — LIP MEDEX EX OINT
TOPICAL_OINTMENT | CUTANEOUS | Status: DC | PRN
  Administered 2022-06-01: 75 via TOPICAL
  Filled 2022-06-01: qty 7

## 2022-06-01 MED ORDER — FAMOTIDINE IN NACL 20-0.9 MG/50ML-% IV SOLN
20.0000 mg | INTRAVENOUS | Status: DC
  Administered 2022-06-01: 20 mg via INTRAVENOUS
  Filled 2022-06-01: qty 50

## 2022-06-01 MED ORDER — ONDANSETRON HCL 4 MG/2ML IJ SOLN
4.0000 mg | Freq: Four times a day (QID) | INTRAMUSCULAR | Status: DC | PRN
  Administered 2022-06-01 – 2022-06-08 (×4): 4 mg via INTRAVENOUS
  Filled 2022-06-01 (×4): qty 2

## 2022-06-01 NOTE — Progress Notes (Signed)
  Transition of Care [facility]) Screening Note   Patient Details  Name: [name] Date of Birth: [birth year]   Transition of Care [facility]) CM/SW Contact:    Vassie Moselle, LCSW Phone Number: 06/01/2022, 11:44 AM    Transition of Care Department [facility]) has reviewed patient and no TOC needs have been identified at this time. We will continue to monitor patient advancement through interdisciplinary progression rounds. If new patient transition needs arise, please place a TOC consult.

## 2022-06-01 NOTE — Consult Note (Signed)
[name]ious Disease    Date of Admission:  05/30/2022   Total days of inpatient antibiotics 2        Reason for Consult: Staph hominis bacteremia    Active Problems:   AKI (acute kidney injury) [facility])   Assessment: 76 year old female admitted with: #1./2 blood cultures positive for Staph hominis #Recent hospitalization and treated for staph Bacteremia with linezolid x2 weeks #Citrobacter freundii positive urine cultures #Urothelial cancer this post ureterectomy with recurrence managed with chemo and stenting - Husband reports patient was fatigued with nausea and vomiting which had worsened.  He reports that patient's symptoms did not really resolved since prior to discharge but had gotten better. -Patient afebrile on admission with WBC 21 K. - She got a dose of vancomycin on 8/22. - Although patient had nausea and vomiting she did not have dysuria.  Her urine cultures grew Citrobacter.  I think we should treat for UTI blood cultures with staph hominis  1/2 could be contaminant.  Given her recent history of chemotherapy, hospitalization for bacteremia will like to monitor closely.  Recommendations:  -Continue cefepime - Repeat blood cultures.  Hold off on restarting vancomycin.  She appears to be doing well in the setting of leukocytosis trending down.  She is currently not on vancomycin.  Microbiology:   Antibiotics: Vanco 8/22 8/24 p  Cultures: Blood 8/22 1/2 staph hominis Urine 8/22 Citrobacter freundii    HPI: [name] is a 76 y.o. female with past medical history of urothelial cancer, hypertension, hyperlipidemia, CHF, CKD, recent hospitalization found to have Staph hominis bacteremia treated with linezolid x2 weeks.  She presented with chills, nausea and vomiting.  On arrival to the ED she was afebrile WBC 21 K.  Chest x-ray unremarkable.  ID engaged as blood cultures grew 1/2 Staph hominis.   Review of Systems: Review of Systems   All other systems reviewed and are negative.   Past Medical History:  Diagnosis Date   Anxiety    Bilateral swelling of feet 03/11/2022   bilateral feet edema much improved per pt on 03-20-2022, bilateral foot edema resolves after propping both feet up   CAD in native artery    a. cath 10/2019- mild to moderate dx >> medical therapy    Cancer [facility])    left ureteral cancer dx sept 2020   CHF (congestive heart failure) ([place])    Chronic kidney disease 06/2019   acute renal insufficiency sees dr Dorina Hoyer, ckd stage 3 per pt   COVID 09/2020   tired sore throat nausea hair fell out loss of taste and smell x 10 days, all symptoms resolved except still has loss of taste and smell   Depression    GERD (gastroesophageal reflux disease)    Hyperlipidemia    Hypertension     Social History   Tobacco Use   Smoking status: Former    Packs/day: 0.50    Years: 38.00    Total pack years: 19.00    Types: Cigarettes    Quit date: 11/26/2003    Years since quitting: 18.5   Smokeless tobacco: Never  Vaping Use   Vaping Use: Never used  Substance Use Topics   Alcohol use: No    Alcohol/week: 0.0 standard drinks of alcohol   Drug use: No    Family History  Problem Relation Age of Onset   Hypertension Mother    Diabetes Mellitus II Sister    Hypertension Sister  Hypertension Brother    Scheduled Meds:  ezetimibe  10 mg Oral Daily   feeding supplement  1 Container Oral TID BM   feeding supplement  237 mL Oral BID BM   heparin  5,000 Units Subcutaneous Q8H   rosuvastatin  40 mg Oral q morning   sertraline  50 mg Oral q morning   traZODone  150 mg Oral QHS   Continuous Infusions:  sodium chloride 100 mL/hr at 06/01/22 0759   ceFEPime (MAXIPIME) IV 2 g (06/01/22 1040)   PRN Meds:.acetaminophen **OR** acetaminophen, albuterol, hydrALAZINE, lip balm, oxybutynin, polyethylene glycol Allergies  Allergen Reactions   Erythromycin Diarrhea and Nausea And Vomiting   Lotensin  [Benazepril Hcl] Cough   Nitrofurantoin Other (See Comments)    OBJECTIVE: Blood pressure 139/64, pulse 68, temperature 98.4 F (36.9 C), temperature source Oral, resp. rate 20, height '5\' 3"'$  (1.6 m), weight 74.4 kg, SpO2 97 %.  Physical Exam Constitutional:      Appearance: Normal appearance.  HENT:     Head: Normocephalic and atraumatic.     Right Ear: Tympanic membrane normal.     Left Ear: Tympanic membrane normal.     Nose: Nose normal.     Mouth/Throat:     Mouth: Mucous membranes are moist.  Eyes:     Extraocular Movements: Extraocular movements intact.     Conjunctiva/sclera: Conjunctivae normal.     Pupils: Pupils are equal, round, and reactive to light.  Cardiovascular:     Rate and Rhythm: Normal rate and regular rhythm.     Heart sounds: No murmur heard.    No friction rub. No gallop.  Pulmonary:     Effort: Pulmonary effort is normal.     Breath sounds: Normal breath sounds.  Abdominal:     General: Abdomen is flat.     Palpations: Abdomen is soft.  Musculoskeletal:        General: Normal range of motion.  Skin:    General: Skin is warm and dry.  Neurological:     General: No focal deficit present.     Mental Status: She is alert and oriented to person, place, and time.  Psychiatric:        Mood and Affect: Mood normal.     Lab Results Lab Results  Component Value Date   WBC 15.5 (H) 06/01/2022   HGB 7.9 (L) 06/01/2022   HCT 25.9 (L) 06/01/2022   MCV 96.3 06/01/2022   PLT 323 06/01/2022    Lab Results  Component Value Date   CREATININE 2.83 (H) 06/01/2022   BUN 37 (H) 06/01/2022   NA 137 06/01/2022   K 4.2 06/01/2022   CL 112 (H) 06/01/2022   CO2 18 (L) 06/01/2022    Lab Results  Component Value Date   ALT 26 05/30/2022   AST 29 05/30/2022   ALKPHOS 85 05/30/2022   BILITOT 0.6 05/30/2022       Laurice Record, [facility] 06/01/2022, 3:34 PM

## 2022-06-01 NOTE — Progress Notes (Addendum)
PROGRESS NOTE  [name]  DOB: [birth year]  PCP: [name], MD RJJ:[id number]  DOA: 05/30/2022  LOS: 2 days  Hospital Day: 3  Brief narrative: [name] is a 76 y.o. female with PMH significant for urothelial cancer, HTN, HLD, CAD, CHF, CKD, PE in July 2023, GERD, anxiety depression. Patient has history of urothelial cancer.  Several years ago, she had high-grade urothelial carcinoma of the right distal ureter and underwent right nephro ureterectomy.  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  It was then treated with laser ablation.  In 2022, she was found to have a small volume recurrence in the left calyceal system.  It was treated with focal ablation but also with injection of Jelmyto in the left collecting system through her nephrostomy tube.  She was started on her second round of chemotherapy which was supposed to go for 6 weeks but she was not able to continue that for more than 3 weeks because of significant pain and left ureteral obstruction. On 03/23/2022, she underwent removal of nephrostomy tube and placement of DJ stent on the left.  Subsequently she had UTI as an outpatient twice treated with antibiotics. 7/14 to 7/19, patient was hospitalized with fever, nausea, vomiting, generalized weakness.  She was noted to have sepsis secondary to left-sided pyelonephritis.  Blood culture grew staph hominis and capitis as well as Staph epidermidis.  Per ID recommendation, she was treated with IV vancomycin and was discharged on 9 days of oral Zyvox which she completed post discharge.  Ureteral stent was left in place.  On that hospitalization, patient was also found to have a small right lower lobe pulm embolism and was started on Eliquis following with long-term aspirin.  Patient states that since starting Eliquis, she started to get hematuria. Patient was recently hospitalized 8/7 to 8/8  for low hemoglobin, FOBT positive and urinalysis with RBCs.  She underwent 2 units of blood transfusion.  [facility] and urologist Dr. Gloriann Loan were consulted.  Hemoglobin stabilized and she was discharged without intervention.  Patient was advised to stop baby aspirin and any other NSAIDs. In the interval, she saw her PCP and was resumed again on Eliquis at half dose of 2.5 mg twice daily and a baby aspirin.  She has started to notice hematuria again. She was in her usual state of health until last night.  Per husband, last night she started to feel warm and had chills.  Around 3 AM this morning, she started having nausea and vomited twice.  She started to get weak and hence EMS was called. EMS noted lobe pressure and give normal saline bolus in route to the ED.  In the ED, patient was afebrile, heart rate in 80s, had a low blood pressure in 80s, breathing on room air Labs with BUN/creatinine elevated to 32/3.17, troponin elevated to 230, lactic acid elevated to 3.7 WBC count elevated to 21.6, hemoglobin low at 9.1, glucose elevated to 120 Urinalysis with cloudy yellow urine with moderate amount of hemoglobin, large amount of leukocytes, positive nitrite, many bacteria EKG in normal sinus rhythm with QTc 459 ms Admitted to hospitalist service Urine cultures sent. Blood culture is showing preliminary growth of gram-positive cocci in anaerobic bottle only. See below for details   Subjective: Patient was seen and examined this morning.   Not in distress.  Lying on bed.  Family not at bedside  today.   Labs this morning with creatinine up to 2.83, WBC count down.   Urine culture report pending.  Assessment/Plan: Sepsis -POA UTI in the presence of left DJ stent Presented with chills, nausea, vomiting, leukocytosis, lactic acidosis and AKI Recent hospitalization for left pyelonephritis, coagulase negative staph bacteremia. Urinalysis on admission positive for leukocytes and nitrite and many bacteria.    Urine culture grew 30,000 CFU per mL of Citrobacter.  Currently clinically improving on IV cefepime.  No fever.  WBC count improving. Continue to monitor Recent Labs  Lab 05/30/22 1000 05/30/22 1246 05/31/22 0626 06/01/22 0601  WBC 21.6*  --  25.7* 15.5*  LATICACIDVEN 3.7* 1.3  --   --      Staph hominis in blood 1 out of 4 blood culture report grew Staphylococcus hominis.  Unclear significance.  However because of patient's immunocompromise status due to cancer and recent bacteremia with a Staph hominis in July, it could be a true infection.  We will get ID consult.  HypOtension Blood pressure low in 80s at presentation.  Improving gradually with IV fluid.  Continue to monitor on IV fluid Keep BP meds on hold  AKI on CKD 3B Creatinine at baseline close to 1.5.  Patient presented with creatinine elevated to 3.17 Gradually improved with IV fluid but trending up again in last 24 hours.  I would increase the rate of IV fluid today. Recent Labs    04/21/22 1924 04/21/22 2139 04/22/22 0500 04/23/22 0346 04/24/22 0407 04/25/22 0437 04/26/22 0425 05/15/22 1617 05/16/22 0026 05/30/22 1000 05/31/22 0626 06/01/22 0601  BUN 12  --  14  --  '21 20 20 12 12 '$ 32* 35* 37*  CREATININE 1.39*   < > 1.29* 1.67* 1.62* 1.38* 1.40* 1.44* 1.43* 3.17* 2.57* 2.83*   < > = values in this interval not displayed.    Elevated troponin Likely demand ischemia due to sepsis.  No real chest pain. Recent Labs    05/30/22 1000 05/30/22 1246  CKTOTAL 40  --   TROPONINIHS 230* 253*    History of CHF PTA on Coreg 6.25 mg twice daily, Imdur 15 mg daily Meds on hold currently because of hypotension.  Once blood pressure improves, will resume low-dose Coreg.    PE in July 2023 04/22/2022, CTPA showed solitary small nonocclusive thrombus in the right lower lobe pulmonary artery at the segmental branching level.  At that time, she was started on Eliquis which she could not continue prolonged because of  hematuria.  Per patient, she has been started back on aspirin and Eliquis by her PCP.  It seems hematuria started again at home.  Current both Eliquis and aspirin on hold at this time.  No longer having hematuria at this time.  Chronic anemia It seems that patient's hemoglobin was more than 12 till May 2023.  It has been gradually trending down since then.  In last hospitalization, hemoglobin was low at 7.1, received 2 units transfusion and was discharged with a hemoglobin of 11.  Patient presented with hemoglobin at 9 this time.  No active bleeding but hemoglobin is running less than 8 since yesterday.  FOBT negative.  Continue to monitor.  May need transfusion if hemoglobin is less than 7. Recent Labs    05/16/22 0026 05/16/22 1339 05/30/22 1000 05/31/22 0626 06/01/22 0601  HGB 7.1* 11.0* 9.1* 7.4* 7.9*  MCV 97.5 91.4 94.5 97.2 96.3  TIBC 259  --   --   --   --  IRON 38  --   --   --   --     CAD/HLD PTA on Coreg, Imdur, Crestor 40 mg daily, Zetia 10 mg daily  GERD PPI  anxiety depression PTA on trazodone, Zoloft  Urothelial cancer S/p right distal ureter and underwent right nephro ureterectomy several years ago Recurrence in left ureter in 2020 s/p focal ablation Recurrence in left calyceal system in 2022.  Was getting chemotherapy to nephrostomy tube under the care of Dr. Diona Fanti.  It was stopped because of intolerance and left ureteral obstruction S/p left ureter DJ stent 03/23/2022 Continue Ditropan  Mobility PT eval.  Goals of care -  Code Status: Full Code    Diet:  Diet Order             Diet Heart Room service appropriate? Yes; Fluid consistency: Thin  Diet effective now                  DVT prophylaxis:  heparin injection 5,000 Units Start: 05/30/22 1400   Antimicrobials: IV cefepime, IV vancomycin Fluid: Minimize NS rate to 100 mL per hour.   Consultants: None at this time Family Communication: Family not at bedside  Status is:  Inpatient  Continue in-hospital care because: Pending culture report, IV antibiotics Level of care: Telemetry   Dispo: The patient is from: Home              Anticipated d/c is to: Pending clinical course.  No PT follow-up required per eval              Patient currently is not medically stable to d/c.   Difficult to place patient No     Infusions:   sodium chloride 100 mL/hr at 06/01/22 0759   ceFEPime (MAXIPIME) IV 2 g (06/01/22 1040)    Scheduled Meds:  ezetimibe  10 mg Oral Daily   feeding supplement  1 Container Oral TID BM   feeding supplement  237 mL Oral BID BM   heparin  5,000 Units Subcutaneous Q8H   rosuvastatin  40 mg Oral q morning   sertraline  50 mg Oral q morning   traZODone  150 mg Oral QHS    PRN meds: acetaminophen **OR** acetaminophen, albuterol, hydrALAZINE, lip balm, oxybutynin, polyethylene glycol   Antimicrobials: Anti-infectives (From admission, onward)    Start     Dose/Rate Route Frequency Ordered Stop   05/31/22 1100  ceFEPIme (MAXIPIME) 2 g in sodium chloride 0.9 % 100 mL IVPB        2 g 200 mL/hr over 30 Minutes Intravenous Every 24 hours 05/30/22 1518     05/30/22 1517  vancomycin variable dose per unstable renal function (pharmacist dosing)  Status:  Discontinued         Does not apply See admin instructions 05/30/22 1518 05/31/22 1214   05/30/22 1030  ceFEPIme (MAXIPIME) 2 g in sodium chloride 0.9 % 100 mL IVPB        2 g 200 mL/hr over 30 Minutes Intravenous  Once 05/30/22 1024 05/30/22 1125   05/30/22 1030  vancomycin (VANCOREADY) IVPB 1500 mg/300 mL        1,500 mg 150 mL/hr over 120 Minutes Intravenous  Once 05/30/22 1024 05/30/22 1242       Objective: Vitals:   05/31/22 2026 06/01/22 0519  BP: (!) 124/59 (!) 142/64  Pulse: 68 75  Resp: 18 20  Temp: 98.3 F (36.8 C) 98.5 F (36.9 C)  SpO2: 98% 95%  Intake/Output Summary (Last 24 hours) at 06/01/2022 1139 Last data filed at 06/01/2022 0900 Gross per 24 hour  Intake  3254.86 ml  Output 600 ml  Net 2654.86 ml    Filed Weights   05/30/22 1427  Weight: 74.4 kg   Weight change:  Body mass index is 29.05 kg/m.   Physical Exam: General exam: Pleasant, elderly Caucasian female.  Not in physical distress Skin: No rashes, lesions or ulcers. HEENT: Atraumatic, normocephalic, no obvious bleeding Lungs: Clear to auscultation bilaterally CVS: Regular rate and rhythm, no murmur GI/Abd soft, no abdominal tenderness, bowel sound present CNS: Alert, awake, oriented x3 Psychiatry: Mood appropriate Extremities: No pedal edema, no calf tenderness  Data Review: I have personally reviewed the laboratory data and studies available.  F/u labs ordered Unresulted Labs (From admission, onward)     Start     Ordered   05/31/22 7048  Basic metabolic panel  Daily at 5am,   R      05/30/22 1128   05/31/22 0500  CBC  Daily at 5am,   R      05/30/22 1128            Signed, Terrilee Croak, MD [facility] 06/01/2022

## 2022-06-01 NOTE — Progress Notes (Signed)
Mobility Specialist - Progress Note   06/01/22 1100  Mobility  Activity Ambulated with assistance in hallway  Level of Assistance Modified independent, requires aide device or extra time  Assistive Device Front wheel walker  Distance Ambulated (ft) 1500 ft  Activity Response Tolerated well  $Mobility charge 1 Mobility   Pt received in room and agreed for mobility. No c/o pain or discomfort. Pt left in bed with all needs met and call bell within reach.  Roderick Pee Mobility Specialist

## 2022-06-01 NOTE — Evaluation (Signed)
Physical Therapy One Time Evaluation Patient Details Name: [name] MRN: [id number] DOB: 1946-09-14 Today's Date: 06/01/2022  History of Present Illness  76 y.o. female with PMH significant for urothelial cancer, HTN, HLD, CAD, CHF, CKD, PE in July 2023, GERD, anxiety depression.  Pt admitted 05/30/22 for acute kidney injury and sepsis on arrival.  Clinical Impression  Patient evaluated by Physical Therapy with no further acute PT needs identified. All education has been completed and the patient has no further questions.  Pt reports not really ambulating since admission so initially a little unsteady however improved with RW.  Pt tolerated good distance and anticipates d/c home tomorrow.  Pt reports she has RW at home she can use and spouse will assist if she needs. See below for any follow-up Physical Therapy or equipment needs. PT is signing off. Thank you for this referral.        Recommendations for follow up therapy are one component of a multi-disciplinary discharge planning process, led by the attending physician.  Recommendations may be updated based on patient status, additional functional criteria and insurance authorization.  Follow Up Recommendations No PT follow up      Assistance Recommended at Discharge PRN  Patient can return home with the following  Help with stairs or ramp for entrance    Equipment Recommendations None recommended by PT  Recommendations for Other Services       Functional Status Assessment Patient has not had a recent decline in their functional status     Precautions / Restrictions Precautions Precautions: None Restrictions Weight Bearing Restrictions: No      Mobility  Bed Mobility Overal bed mobility: Modified Independent                  Transfers Overall transfer level: Modified independent                      Ambulation/Gait Ambulation/Gait assistance: Supervision, Modified independent (Device/Increase  time) Gait Distance (Feet): 200 Feet Assistive device: Rolling walker (2 wheels) Gait Pattern/deviations: Step-through pattern, Decreased stride length       General Gait Details: pt initially a little unsteady and requested RW which improved stability, pt has RW she can use at home  Stairs            Wheelchair Mobility    Modified Rankin (Stroke Patients Only)       Balance Overall balance assessment: Mild deficits observed, not formally tested                                           Pertinent Vitals/Pain Pain Assessment Pain Assessment: No/denies pain    Home Living Family/patient expects to be discharged to:: Private residence Living Arrangements: Spouse/significant other Available Help at Discharge: Family Type of Home: House Home Access: Stairs to enter Entrance Stairs-Rails: Right Entrance Stairs-Number of Steps: 5   Home Layout: One level Home Equipment: Conservation officer, nature (2 wheels);Cane - single point      Prior Function Prior Level of Function : Independent/Modified Independent                     Hand Dominance        Extremity/Trunk Assessment        Lower Extremity Assessment Lower Extremity Assessment: Generalized weakness    Cervical / Trunk Assessment Cervical / Trunk Assessment:  Normal  Communication   Communication: No difficulties  Cognition Arousal/Alertness: Awake/alert Behavior During Therapy: WFL for tasks assessed/performed Overall Cognitive Status: Within Functional Limits for tasks assessed                                          General Comments      Exercises     Assessment/Plan    PT Assessment Patient needs continued PT services;Patient does not need any further PT services  PT Problem List         PT Treatment Interventions      PT Goals (Current goals can be found in the Care Plan section)  Acute Rehab PT Goals PT Goal Formulation: All assessment and  education complete, DC therapy    Frequency       Co-evaluation               AM-PAC PT "6 Clicks" Mobility  Outcome Measure Help needed turning from your back to your side while in a flat bed without using bedrails?: None Help needed moving from lying on your back to sitting on the side of a flat bed without using bedrails?: None Help needed moving to and from a bed to a chair (including a wheelchair)?: None Help needed standing up from a chair using your arms (e.g., wheelchair or bedside chair)?: None Help needed to walk in hospital room?: A Little Help needed climbing 3-5 steps with a railing? : A Little 6 Click Score: 22    End of Session   Activity Tolerance: Patient tolerated treatment well Patient left: in bed;with call bell/phone within reach   PT Visit Diagnosis: Difficulty in walking, not elsewhere classified (R26.2)    Time: 0950-1000 PT Time Calculation (min) (ACUTE ONLY): 10 min   Charges:   PT Evaluation $PT Eval Low Complexity: 1 Low     Kati PT, DPT Physical Therapist Acute Rehabilitation Services Preferred contact method: Secure Chat Weekend Pager Only: [phone] Office: [phone]   Myrtis Hopping [place] 06/01/2022, 11:37 AM

## 2022-06-01 NOTE — Progress Notes (Signed)
  Echocardiogram 2D Echocardiogram has been performed.  Bobbye Charleston 06/01/2022, 3:47 PM

## 2022-06-02 ENCOUNTER — Inpatient Hospital Stay (HOSPITAL_COMMUNITY): Payer: Medicare PPO

## 2022-06-02 DIAGNOSIS — B957 Other staphylococcus as the cause of diseases classified elsewhere: Secondary | ICD-10-CM | POA: Diagnosis not present

## 2022-06-02 DIAGNOSIS — N179 Acute kidney failure, unspecified: Secondary | ICD-10-CM | POA: Diagnosis not present

## 2022-06-02 DIAGNOSIS — R7881 Bacteremia: Secondary | ICD-10-CM | POA: Diagnosis not present

## 2022-06-02 LAB — CULTURE, BLOOD (ROUTINE X 2): Special Requests: ADEQUATE

## 2022-06-02 LAB — BASIC METABOLIC PANEL
Anion gap: 4 — ABNORMAL LOW (ref 5–15)
BUN: 34 mg/dL — ABNORMAL HIGH (ref 8–23)
CO2: 20 mmol/L — ABNORMAL LOW (ref 22–32)
Calcium: 8.1 mg/dL — ABNORMAL LOW (ref 8.9–10.3)
Chloride: 118 mmol/L — ABNORMAL HIGH (ref 98–111)
Creatinine, Ser: 2.91 mg/dL — ABNORMAL HIGH (ref 0.44–1.00)
GFR, Estimated: 17 mL/min — ABNORMAL LOW (ref >60)
Glucose, Bld: 90 mg/dL (ref 70–99)
Potassium: 4.5 mmol/L (ref 3.5–5.1)
Sodium: 142 mmol/L (ref 135–145)

## 2022-06-02 LAB — CBC
HCT: 25.1 % — ABNORMAL LOW (ref 36.0–46.0)
Hemoglobin: 7.5 g/dL — ABNORMAL LOW (ref 12.0–15.0)
MCH: 28.6 pg (ref 26.0–34.0)
MCHC: 29.9 g/dL — ABNORMAL LOW (ref 30.0–36.0)
MCV: 95.8 fL (ref 80.0–100.0)
Platelets: 324 10*3/uL (ref 150–400)
RBC: 2.62 MIL/uL — ABNORMAL LOW (ref 3.87–5.11)
RDW: 18 % — ABNORMAL HIGH (ref 11.5–15.5)
WBC: 13.9 10*3/uL — ABNORMAL HIGH (ref 4.0–10.5)
nRBC: 0 % (ref 0.0–0.2)

## 2022-06-02 MED ORDER — FAMOTIDINE 20 MG PO TABS
10.0000 mg | ORAL_TABLET | ORAL | Status: DC
  Administered 2022-06-03 – 2022-06-11 (×5): 10 mg via ORAL
  Filled 2022-06-02 (×8): qty 1

## 2022-06-02 MED ORDER — FAMOTIDINE 200 MG/20ML IV SOLN
10.0000 mg | INTRAVENOUS | Status: DC
Start: 2022-06-03 — End: 2022-06-02

## 2022-06-02 MED ORDER — CIPROFLOXACIN HCL 500 MG PO TABS
250.0000 mg | ORAL_TABLET | Freq: Every day | ORAL | Status: AC
  Administered 2022-06-02 – 2022-06-07 (×6): 250 mg via ORAL
  Filled 2022-06-02 (×6): qty 1

## 2022-06-02 MED ORDER — ROSUVASTATIN CALCIUM 10 MG PO TABS
10.0000 mg | ORAL_TABLET | Freq: Every morning | ORAL | Status: DC
  Administered 2022-06-02 – 2022-06-11 (×10): 10 mg via ORAL
  Filled 2022-06-02 (×10): qty 1

## 2022-06-02 NOTE — Progress Notes (Signed)
Pharmacy Antibiotic Note  [name] is a 76 y.o. female admitted on 05/30/2022 with AKI and high likelihood for sepsis. Patient with urothelial cancer, history of recurrence. Patient had been on chemotherapy to nephrostomy tube but was stopped because of intolerance and left ureteral obstruction. She has recent history of left pyelonephritis and bacteremia (treated w/ linezolid x 2 weeks). Pharmacy has been consulted for cefepime dosing.  ID following - blood cultures from 8/22 resulted with 1/4 bottles Staph hominis- question if contaminant. ID recommends to continue cefepime for now and monitor repeat blood cultures. Patient appears clinically improved with a decrease in WBC and she is afebrile.   SCr increased today at 2.9 which is up from her baseline ~ 1.3-1.6. CrCl ~16 mL/min  Plan: -Continue Cefepime 2 g IV q24h -Pharmacy to continue to follow renal function, cultures and clinical progress for dose adjustments and de-escalation as indicated  Height: '5\' 3"'$  (160 cm) Weight: 74.4 kg (164 lb) IBW/kg (Calculated) : 52.4  Temp (24hrs), Avg:98.5 F (36.9 C), Min:98.4 F (36.9 C), Max:98.6 F (37 C)  Recent Labs  Lab 05/30/22 1000 05/30/22 1246 05/31/22 0626 06/01/22 0601 06/02/22 0524  WBC 21.6*  --  25.7* 15.5* 13.9*  CREATININE 3.17*  --  2.57* 2.83* 2.91*  LATICACIDVEN 3.7* 1.3  --   --   --      Estimated Creatinine Clearance: 16.6 mL/min (A) (by C-G formula based on SCr of 2.91 mg/dL (H)).    Allergies  Allergen Reactions   Erythromycin Diarrhea and Nausea And Vomiting   Lotensin [Benazepril Hcl] Cough   Nitrofurantoin Other (See Comments)    Antimicrobials this admission: Cefepime 8/22 >> Vancomycin 8/22 >> 8/22  Dose adjustments this admission: NA  Microbiology results: 8/22 BCx: 1/4 Staph hominis  8/22 UCx: 30K Citrobacter freundii (R-Ancef, CTX; I-Zosyn) 8/24 BCx: pending  Thank you for allowing pharmacy to be a part of this patient's  care.  Dimple Nanas, PharmD, BCPS 06/02/2022 7:40 AM

## 2022-06-02 NOTE — Progress Notes (Signed)
Mobility Specialist - Progress Note   06/02/22 1400  Mobility  Activity Ambulated with assistance in hallway  Level of Assistance Standby assist, set-up cues, supervision of patient - no hands on  Assistive Device Front wheel walker  Distance Ambulated (ft) 750 ft  Activity Response Tolerated well  $Mobility charge 1 Mobility   Pt received in bed and agreed to mobility. During ambulation, pt had pain in legs 6/10. Returned to bed with call bell within reach and NT notified of session.   Roderick Pee Mobility Specialist

## 2022-06-02 NOTE — Progress Notes (Signed)
PROGRESS NOTE  [name]  DOB: Oct 13, 1945  PCP: [name], MD OVZ:[id number]  DOA: 05/30/2022  LOS: 3 days  Hospital Day: 4  Brief narrative: [name] is a 76 y.o. female with PMH significant for urothelial cancer, HTN, HLD, CAD, CHF, CKD, PE in July 2023, GERD, anxiety depression. Patient has history of urothelial cancer.  Several years ago, she had high-grade urothelial carcinoma of the right distal ureter and underwent right nephro ureterectomy.  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  It was then treated with laser ablation.  In 2022, she was found to have a small volume recurrence in the left calyceal system.  It was treated with focal ablation but also with injection of Jelmyto in the left collecting system through her nephrostomy tube.  She was started on her second round of chemotherapy which was supposed to go for 6 weeks but she was not able to continue that for more than 3 weeks because of significant pain and left ureteral obstruction. On 03/23/2022, she underwent removal of nephrostomy tube and placement of DJ stent on the left.  Subsequently she had UTI as an outpatient twice treated with antibiotics. 7/14 to 7/19, patient was hospitalized with fever, nausea, vomiting, generalized weakness.  She was noted to have sepsis secondary to left-sided pyelonephritis.  Blood culture grew staph hominis and capitis as well as Staph epidermidis.  Per ID recommendation, she was treated with IV vancomycin and was discharged on 9 days of oral Zyvox which she completed post discharge.  Ureteral stent was left in place.  On that hospitalization, patient was also found to have a small right lower lobe pulm embolism and was started on Eliquis following with long-term aspirin.  Patient states that since starting Eliquis, she started to get hematuria. Patient was recently hospitalized 8/7 to 8/8  for low hemoglobin, FOBT positive and urinalysis with RBCs.  She underwent 2 units of blood transfusion.  [facility] and urologist Dr. Gloriann Loan were consulted.  Hemoglobin stabilized and she was discharged without intervention.  Patient was advised to stop baby aspirin and any other NSAIDs. In the interval, she saw her PCP and was resumed again on Eliquis at half dose of 2.5 mg twice daily and a baby aspirin.  She has started to notice hematuria again. She was in her usual state of health until last night.  Per husband, last night she started to feel warm and had chills.  Around 3 AM this morning, she started having nausea and vomited twice.  She started to get weak and hence EMS was called. EMS noted lobe pressure and give normal saline bolus in route to the ED.  In the ED, patient was afebrile, heart rate in 80s, had a low blood pressure in 80s, breathing on room air Labs with BUN/creatinine elevated to 32/3.17, troponin elevated to 230, lactic acid elevated to 3.7 WBC count elevated to 21.6, hemoglobin low at 9.1, glucose elevated to 120 Urinalysis with cloudy yellow urine with moderate amount of hemoglobin, large amount of leukocytes, positive nitrite, many bacteria EKG in normal sinus rhythm with QTc 459 ms Admitted to hospitalist service Urine cultures sent. Blood culture is showing preliminary growth of gram-positive cocci in anaerobic bottle only. See below for details   Subjective: Patient was seen and examined this morning.   Propped up in bed.  Not in distress.  Husband at bedside.  Labs this morning with improving WBC count, creatinine higher than yesterday.  Assessment/Plan: Sepsis -POA UTI in the presence of left DJ stent Presented with chills, nausea, vomiting, leukocytosis, lactic acidosis and AKI Recent hospitalization for left pyelonephritis, coagulase negative staph bacteremia. Urinalysis on admission positive for leukocytes and nitrite and many bacteria.   Urine culture grew  30,000 CFU per mL of Citrobacter.  Clinically improving on IV cefepime.  No fever.  WBC count improving.  Noted that ID has switched her antibiotics to oral ciprofloxacin today. Continue to monitor Recent Labs  Lab 05/30/22 1000 05/30/22 1246 05/31/22 0626 06/01/22 0601 06/02/22 0524  WBC 21.6*  --  25.7* 15.5* 13.9*  LATICACIDVEN 3.7* 1.3  --   --   --     Staph hominis in blood 1 out of 4 blood culture report grew Staphylococcus hominis.  Patient had recent bacteremia with a Staph hominis in July as well.  ID consult was obtained.  1/4 positive blood culture was regarded as a contaminant.  Currently not on IV vancomycin.  HypOtension Blood pressure low in 80s at presentation.  Improving gradually with IV fluid.  Continue to monitor on IV fluid Keep BP meds on hold  AKI on CKD 3B Creatinine at baseline close to 1.5.  Patient presented with creatinine elevated to 3.17 Gradually improved with IV fluid but worsening again in last 48 hours.  She is on IV hydration.  I will continue the same. Recent Labs    04/22/22 0500 04/23/22 0346 04/24/22 0407 04/25/22 0437 04/26/22 0425 05/15/22 1617 05/16/22 0026 05/30/22 1000 05/31/22 0626 06/01/22 0601 06/02/22 0524  BUN 14  --  '21 20 20 12 12 '$ 32* 35* 37* 34*  CREATININE 1.29* 1.67* 1.62* 1.38* 1.40* 1.44* 1.43* 3.17* 2.57* 2.83* 2.91*   Elevated troponin Likely demand ischemia due to sepsis.  No real chest pain. No results for input(s): "CKTOTAL", "CKMB", "TROPONINIHS", "RELINDX" in the last 72 hours.   History of CHF PTA on Coreg 6.25 mg twice daily, Imdur 15 mg daily Meds were initially held because of hypotension.  Coreg has been resumed at a lower dose of 3.125 mg twice daily.  Imdur remains on hold.  Blood pressure in normal range now.  PE in July 2023 04/22/2022, CTPA showed solitary small nonocclusive thrombus in the right lower lobe pulmonary artery at the segmental branching level.  At that time, she was started on  Eliquis which she could not continue prolonged because of hematuria.  Per patient, she has been started back on aspirin and Eliquis by her PCP.  It seems hematuria started again at home.  Current both Eliquis and aspirin on hold at this time.  No longer having hematuria at this time.  Chronic anemia It seems that patient's hemoglobin was more than 12 till May 2023.  It has been gradually trending down since then.  In last hospitalization, hemoglobin was low at 7.1, received 2 units transfusion and was discharged with a hemoglobin of 11.  Patient presented with hemoglobin at 9 this time.  No active bleeding but hemoglobin is running less than 8 since yesterday.  FOBT negative.  Continue to monitor.  May need transfusion if hemoglobin is less than 7. Recent Labs    05/16/22 0026 05/16/22 1339 05/30/22 1000 05/31/22 0626 06/01/22 0601 06/02/22 0524  HGB 7.1* 11.0* 9.1* 7.4* 7.9* 7.5*  MCV 97.5 91.4 94.5 97.2 96.3 95.8  TIBC 259  --   --   --   --   --  IRON 38  --   --   --   --   --    CAD/HLD PTA on Coreg, Imdur, Crestor 40 mg daily, Zetia 10 mg daily  GERD PPI  anxiety depression PTA on trazodone, Zoloft  Urothelial cancer S/p right distal ureter and underwent right nephro ureterectomy several years ago Recurrence in left ureter in 2020 s/p focal ablation Recurrence in left calyceal system in 2022.  Was getting chemotherapy to nephrostomy tube under the care of Dr. Diona Fanti.  It was stopped because of intolerance and left ureteral obstruction S/p left ureter DJ stent 03/23/2022 Continue Ditropan Discussed with Dr. Diona Fanti this morning.  KUB was obtained which showed the stent in position.  No other abnormality seen.  Mobility PT eval.  Goals of care -  Code Status: Full Code    Diet:  Diet Order             Diet Heart Room service appropriate? Yes; Fluid consistency: Thin  Diet effective now                  DVT prophylaxis:  heparin injection 5,000 Units  Start: 05/30/22 1400   Antimicrobials: Ciprofloxacin 250 mg daily. Fluid: Minimize NS rate to 100 mL per hour.   Consultants: None at this time Family Communication: Family not at bedside  Status is: Inpatient  Continue in-hospital care because: Pending culture report, IV antibiotics Level of care: Telemetry   Dispo: The patient is from: Home              Anticipated d/c is to: Pending clinical course.  No PT follow-up required per eval              Patient currently is not medically stable to d/c.   Difficult to place patient No     Infusions:   sodium chloride 100 mL/hr at 06/02/22 0617    Scheduled Meds:  calcium carbonate  1 tablet Oral TID WC   ciprofloxacin  250 mg Oral Daily   ezetimibe  10 mg Oral Daily   [START ON 06/03/2022] famotidine  10 mg Oral QODAY   feeding supplement  1 Container Oral TID BM   feeding supplement  237 mL Oral BID BM   heparin  5,000 Units Subcutaneous Q8H   rosuvastatin  10 mg Oral q morning   sertraline  50 mg Oral q morning   traZODone  150 mg Oral QHS    PRN meds: acetaminophen **OR** acetaminophen, albuterol, hydrALAZINE, lip balm, ondansetron (ZOFRAN) IV, oxybutynin, polyethylene glycol   Antimicrobials: Anti-infectives (From admission, onward)    Start     Dose/Rate Route Frequency Ordered Stop   06/02/22 1100  ciprofloxacin (CIPRO) tablet 250 mg        250 mg Oral Daily 06/02/22 0943 06/08/22 0959   05/31/22 1100  ceFEPIme (MAXIPIME) 2 g in sodium chloride 0.9 % 100 mL IVPB  Status:  Discontinued        2 g 200 mL/hr over 30 Minutes Intravenous Every 24 hours 05/30/22 1518 06/02/22 0943   05/30/22 1517  vancomycin variable dose per unstable renal function (pharmacist dosing)  Status:  Discontinued         Does not apply See admin instructions 05/30/22 1518 05/31/22 1214   05/30/22 1030  ceFEPIme (MAXIPIME) 2 g in sodium chloride 0.9 % 100 mL IVPB        2 g 200 mL/hr over 30 Minutes Intravenous  Once 05/30/22 1024 05/30/22  1125  05/30/22 1030  vancomycin (VANCOREADY) IVPB 1500 mg/300 mL        1,500 mg 150 mL/hr over 120 Minutes Intravenous  Once 05/30/22 1024 05/30/22 1242       Objective: Vitals:   06/02/22 0456 06/02/22 1328  BP: (!) 141/60 130/69  Pulse: 77 65  Resp: 20 16  Temp: 98.4 F (36.9 C) 97.9 F (36.6 C)  SpO2: 97% 98%    Intake/Output Summary (Last 24 hours) at 06/02/2022 1357 Last data filed at 06/02/2022 1300 Gross per 24 hour  Intake 2387.23 ml  Output --  Net 2387.23 ml   Filed Weights   05/30/22 1427  Weight: 74.4 kg   Weight change:  Body mass index is 29.05 kg/m.   Physical Exam: General exam: Pleasant, elderly Caucasian female.  Not in physical distress Skin: No rashes, lesions or ulcers. HEENT: Atraumatic, normocephalic, no obvious bleeding Lungs: Clear to auscultation bilaterally CVS: Regular rate and rhythm, no murmur GI/Abd soft, no abdominal tenderness, bowel sound present CNS: Alert, awake, oriented x3 Psychiatry: Mood appropriate Extremities: No pedal edema, no calf tenderness  Data Review: I have personally reviewed the laboratory data and studies available.  F/u labs ordered Unresulted Labs (From admission, onward)     Start     Ordered   06/03/22 0500  CBC with Differential/Platelet  Tomorrow morning,   R       Question:  Specimen collection method  Answer:  Lab=Lab collect   06/02/22 1357   06/03/22 2863  Basic metabolic panel  Tomorrow morning,   R       Question:  Specimen collection method  Answer:  Lab=Lab collect   06/02/22 1357            Signed, Terrilee Croak, MD [facility] 06/02/2022

## 2022-06-03 DIAGNOSIS — C680 Malignant neoplasm of urethra: Secondary | ICD-10-CM

## 2022-06-03 DIAGNOSIS — R7881 Bacteremia: Secondary | ICD-10-CM | POA: Diagnosis not present

## 2022-06-03 DIAGNOSIS — B957 Other staphylococcus as the cause of diseases classified elsewhere: Secondary | ICD-10-CM | POA: Diagnosis not present

## 2022-06-03 DIAGNOSIS — N179 Acute kidney failure, unspecified: Secondary | ICD-10-CM | POA: Diagnosis not present

## 2022-06-03 LAB — CBC WITH DIFFERENTIAL/PLATELET
Abs Immature Granulocytes: 0.53 10*3/uL — ABNORMAL HIGH (ref 0.00–0.07)
Basophils Absolute: 0.1 10*3/uL (ref 0.0–0.1)
Basophils Relative: 1 %
Eosinophils Absolute: 0.4 10*3/uL (ref 0.0–0.5)
Eosinophils Relative: 3 %
HCT: 25 % — ABNORMAL LOW (ref 36.0–46.0)
Hemoglobin: 7.4 g/dL — ABNORMAL LOW (ref 12.0–15.0)
Immature Granulocytes: 4 %
Lymphocytes Relative: 12 %
Lymphs Abs: 1.8 10*3/uL (ref 0.7–4.0)
MCH: 29.1 pg (ref 26.0–34.0)
MCHC: 29.6 g/dL — ABNORMAL LOW (ref 30.0–36.0)
MCV: 98.4 fL (ref 80.0–100.0)
Monocytes Absolute: 1.3 10*3/uL — ABNORMAL HIGH (ref 0.1–1.0)
Monocytes Relative: 9 %
Neutro Abs: 11 10*3/uL — ABNORMAL HIGH (ref 1.7–7.7)
Neutrophils Relative %: 71 %
Platelets: 309 10*3/uL (ref 150–400)
RBC: 2.54 MIL/uL — ABNORMAL LOW (ref 3.87–5.11)
RDW: 18.6 % — ABNORMAL HIGH (ref 11.5–15.5)
WBC: 15.1 10*3/uL — ABNORMAL HIGH (ref 4.0–10.5)
nRBC: 0.2 % (ref 0.0–0.2)

## 2022-06-03 LAB — BASIC METABOLIC PANEL
Anion gap: 5 (ref 5–15)
BUN: 38 mg/dL — ABNORMAL HIGH (ref 8–23)
CO2: 20 mmol/L — ABNORMAL LOW (ref 22–32)
Calcium: 8.3 mg/dL — ABNORMAL LOW (ref 8.9–10.3)
Chloride: 119 mmol/L — ABNORMAL HIGH (ref 98–111)
Creatinine, Ser: 3.09 mg/dL — ABNORMAL HIGH (ref 0.44–1.00)
GFR, Estimated: 15 mL/min — ABNORMAL LOW (ref >60)
Glucose, Bld: 95 mg/dL (ref 70–99)
Potassium: 5.2 mmol/L — ABNORMAL HIGH (ref 3.5–5.1)
Sodium: 144 mmol/L (ref 135–145)

## 2022-06-03 NOTE — Progress Notes (Signed)
[facility]  Date of Admission:  05/30/2022   Total days of inpatient antibiotics 4  Active Problems:   AKI (acute kidney injury) [facility])          Assessment: 76 year old female admitted with: #1/2 blood cultures positive for Staph hominis #Recent hospitalization and treated for staph Bacteremia with linezolid x2 weeks #Citrobacter freundii positive urine cultures #Urothelial cancer this post ureterectomy with recurrence managed with chemo and stenting - Husband reports patient was fatigued with nausea and vomiting which had worsened.  He reports that patient's symptoms did not really resolved since prior to discharge but had gotten better. -Patient afebrile on admission with WBC 21 K. - She got a dose of vancomycin on 8/22. - Although patient had nausea and vomiting she did not have dysuria.  Her urine cultures grew Citrobacter.  -Today 8/26, patient reports feeling well.  She reports her nausea and vomiting have resolved.  Her symptoms and leukocytosis was likely due to UTI.  As she has improved without being on treatment for the Staph hominis found in blood cultures.  Recommendations:  -Complete 10 day antibiotics course with ciprofloxacin(EOT 8/31) -Staph hominis blood cultures consistent with contamination as such we will hold off on targeted immediate antibiotic therapy for S. Hominis.  - Follow repeat blood Cx  ID will sign off, please engage with any question or concerns.    Microbiology:   Antibiotics: Vanco 8/22 8/24 p   Cultures: Blood 8/22 1/2 staph hominis Urine 8/22 Citrobacter freundii    SUBJECTIVE: No new complaints. Denies fever and chills  Interval: Afebrile  Review of Systems: Review of Systems  All other systems reviewed and are negative.    Scheduled Meds:  calcium carbonate  1 tablet Oral TID WC   ciprofloxacin  250 mg Oral Daily   ezetimibe  10 mg Oral Daily   famotidine  10 mg Oral QODAY   feeding  supplement  1 Container Oral TID BM   feeding supplement  237 mL Oral BID BM   heparin  5,000 Units Subcutaneous Q8H   rosuvastatin  10 mg Oral q morning   sertraline  50 mg Oral q morning   traZODone  150 mg Oral QHS   Continuous Infusions:  sodium chloride 100 mL/hr at 06/03/22 0517   PRN Meds:.acetaminophen **OR** acetaminophen, albuterol, hydrALAZINE, lip balm, ondansetron (ZOFRAN) IV, oxybutynin, polyethylene glycol Allergies  Allergen Reactions   Erythromycin Diarrhea and Nausea And Vomiting   Lotensin [Benazepril Hcl] Cough   Nitrofurantoin Other (See Comments)    OBJECTIVE: Vitals:   06/02/22 0456 06/02/22 1328 06/02/22 2134 06/03/22 0423  BP: (!) 141/60 130/69 (!) 147/72 (!) 141/66  Pulse: 77 65 79 73  Resp: '20 16 18 18  '$ Temp: 98.4 F (36.9 C) 97.9 F (36.6 C) 98 F (36.7 C) 97.9 F (36.6 C)  TempSrc:  Oral Oral Oral  SpO2: 97% 98% 97% 97%  Weight:      Height:       Body mass index is 29.05 kg/m.  Physical Exam Constitutional:      Appearance: Normal appearance.  HENT:     Head: Normocephalic and atraumatic.     Right Ear: Tympanic membrane normal.     Left Ear: Tympanic membrane normal.     Nose: Nose normal.     Mouth/Throat:     Mouth: Mucous membranes are moist.  Eyes:     Extraocular Movements: Extraocular movements intact.  Conjunctiva/sclera: Conjunctivae normal.     Pupils: Pupils are equal, round, and reactive to light.  Cardiovascular:     Rate and Rhythm: Normal rate and regular rhythm.     Heart sounds: No murmur heard.    No friction rub. No gallop.  Pulmonary:     Effort: Pulmonary effort is normal.     Breath sounds: Normal breath sounds.  Abdominal:     General: Abdomen is flat.     Palpations: Abdomen is soft.  Musculoskeletal:        General: Normal range of motion.  Skin:    General: Skin is warm and dry.  Neurological:     General: No focal deficit present.     Mental Status: She is alert and oriented to person,  place, and time.  Psychiatric:        Mood and Affect: Mood normal.       Lab Results Lab Results  Component Value Date   WBC 15.1 (H) 06/03/2022   HGB 7.4 (L) 06/03/2022   HCT 25.0 (L) 06/03/2022   MCV 98.4 06/03/2022   PLT 309 06/03/2022    Lab Results  Component Value Date   CREATININE 3.09 (H) 06/03/2022   BUN 38 (H) 06/03/2022   NA 144 06/03/2022   K 5.2 (H) 06/03/2022   CL 119 (H) 06/03/2022   CO2 20 (L) 06/03/2022    Lab Results  Component Value Date   ALT 26 05/30/2022   AST 29 05/30/2022   ALKPHOS 85 05/30/2022   BILITOT 0.6 05/30/2022        Laurice Record, [facility] 06/03/2022, 11:41 AM

## 2022-06-03 NOTE — Plan of Care (Signed)

## 2022-06-04 DIAGNOSIS — N179 Acute kidney failure, unspecified: Secondary | ICD-10-CM | POA: Diagnosis not present

## 2022-06-04 LAB — CBC
HCT: 23.4 % — ABNORMAL LOW (ref 36.0–46.0)
Hemoglobin: 6.9 g/dL — CL (ref 12.0–15.0)
MCH: 28.9 pg (ref 26.0–34.0)
MCHC: 29.5 g/dL — ABNORMAL LOW (ref 30.0–36.0)
MCV: 97.9 fL (ref 80.0–100.0)
Platelets: 286 10*3/uL (ref 150–400)
RBC: 2.39 MIL/uL — ABNORMAL LOW (ref 3.87–5.11)
RDW: 18.5 % — ABNORMAL HIGH (ref 11.5–15.5)
WBC: 14.7 10*3/uL — ABNORMAL HIGH (ref 4.0–10.5)
nRBC: 0.1 % (ref 0.0–0.2)

## 2022-06-04 LAB — BASIC METABOLIC PANEL
Anion gap: 4 — ABNORMAL LOW (ref 5–15)
BUN: 34 mg/dL — ABNORMAL HIGH (ref 8–23)
CO2: 17 mmol/L — ABNORMAL LOW (ref 22–32)
Calcium: 7.8 mg/dL — ABNORMAL LOW (ref 8.9–10.3)
Chloride: 117 mmol/L — ABNORMAL HIGH (ref 98–111)
Creatinine, Ser: 3.11 mg/dL — ABNORMAL HIGH (ref 0.44–1.00)
GFR, Estimated: 15 mL/min — ABNORMAL LOW (ref >60)
Glucose, Bld: 88 mg/dL (ref 70–99)
Potassium: 5.2 mmol/L — ABNORMAL HIGH (ref 3.5–5.1)
Sodium: 138 mmol/L (ref 135–145)

## 2022-06-04 LAB — PREPARE RBC (CROSSMATCH)

## 2022-06-04 LAB — CULTURE, BLOOD (ROUTINE X 2)
Culture: NO GROWTH
Special Requests: ADEQUATE

## 2022-06-04 MED ORDER — SODIUM ZIRCONIUM CYCLOSILICATE 10 G PO PACK
10.0000 g | PACK | Freq: Once | ORAL | Status: AC
  Administered 2022-06-04: 10 g via ORAL
  Filled 2022-06-04: qty 1

## 2022-06-04 MED ORDER — SODIUM CHLORIDE 0.9% IV SOLUTION: Freq: Once | INTRAVENOUS | Status: AC

## 2022-06-04 NOTE — Progress Notes (Signed)
PROGRESS NOTE  [name]  DOB: 1945/10/22  PCP: [name], MD XLK:[id number]  DOA: 05/30/2022  LOS: 5 days  Hospital Day: 6  Brief narrative: [name] is a 76 y.o. female with PMH significant for urothelial cancer, HTN, HLD, CAD, CHF, CKD, PE in July 2023, GERD, anxiety depression. Patient has history of urothelial cancer.  Several years ago, she had high-grade urothelial carcinoma of the right distal ureter and underwent right nephro ureterectomy.  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  It was then treated with laser ablation.  In 2022, she was found to have a small volume recurrence in the left calyceal system.  It was treated with focal ablation but also with injection of Jelmyto in the left collecting system through her nephrostomy tube.  She was started on her second round of chemotherapy which was supposed to go for 6 weeks but she was not able to continue that for more than 3 weeks because of significant pain and left ureteral obstruction. On 03/23/2022, she underwent removal of nephrostomy tube and placement of DJ stent on the left.  Subsequently she had UTI as an outpatient twice treated with antibiotics. 7/14 to 7/19, patient was hospitalized with fever, nausea, vomiting, generalized weakness.  She was noted to have sepsis secondary to left-sided pyelonephritis.  Blood culture grew staph hominis and capitis as well as Staph epidermidis.  Per ID recommendation, she was treated with IV vancomycin and was discharged on 9 days of oral Zyvox which she completed post discharge.  Ureteral stent was left in place.  On that hospitalization, patient was also found to have a small right lower lobe pulm embolism and was started on Eliquis following with long-term aspirin.  Patient states that since starting Eliquis, she started to get hematuria. Patient was recently hospitalized 8/7 to 8/8  for low hemoglobin, FOBT positive and urinalysis with RBCs.  She underwent 2 units of blood transfusion.  [facility] and urologist Dr. Gloriann Loan were consulted.  Hemoglobin stabilized and she was discharged without intervention.  Patient was advised to stop baby aspirin and any other NSAIDs. In the interval, she saw her PCP and was resumed again on Eliquis at half dose of 2.5 mg twice daily and a baby aspirin.  She has started to notice hematuria again. She was in her usual state of health until last night.  Per husband, last night she started to feel warm and had chills.  Around 3 AM this morning, she started having nausea and vomited twice.  She started to get weak and hence EMS was called. EMS noted lobe pressure and give normal saline bolus in route to the ED.  In the ED, patient was afebrile, heart rate in 80s, had a low blood pressure in 80s, breathing on room air Labs with BUN/creatinine elevated to 32/3.17, troponin elevated to 230, lactic acid elevated to 3.7 WBC count elevated to 21.6, hemoglobin low at 9.1, glucose elevated to 120 Urinalysis with cloudy yellow urine with moderate amount of hemoglobin, large amount of leukocytes, positive nitrite, many bacteria EKG in normal sinus rhythm with QTc 459 ms Admitted to hospitalist service Urine cultures sent. Blood culture is showing preliminary growth of gram-positive cocci in anaerobic bottle only. See below for details   Subjective: Patient was seen and examined this morning.   Not in distress.  No new symptoms.  Potassium elevated to 5.2  this morning.  Creatinine elevated as well.  WBC count elevated as well  Assessment/Plan: Sepsis -POA UTI in the presence of left DJ stent Presented with chills, nausea, vomiting, leukocytosis, lactic acidosis and AKI Recent hospitalization for left pyelonephritis, coagulase negative staph bacteremia. Urinalysis on admission positive for leukocytes and nitrite and many bacteria.   Urine culture grew  30,000 CFU per mL of Citrobacter.  Clinically improved on IV cefepime.  Switched to oral ciprofloxacin.   Lactic acid level improved. Recent Labs  Lab 05/30/22 1000 05/30/22 1246 05/31/22 0626 06/01/22 0601 06/02/22 0524 06/03/22 0606 06/04/22 0820  WBC 21.6*  --  25.7* 15.5* 13.9* 15.1* 14.7*  LATICACIDVEN 3.7* 1.3  --   --   --   --   --      Staph hominis in blood 1 out of 4 blood culture report grew Staphylococcus hominis.  Patient had recent bacteremia with a Staph hominis in July as well.  ID consult was obtained.  1/4 positive blood culture was regarded as a contaminant.  Currently not on IV vancomycin.  HypOtension Blood pressure low in 80s at presentation.  Improving gradually with IV fluid.  Continue to monitor on IV fluid Keep BP meds on hold  AKI on CKD 3B Creatinine at baseline close to 1.5.  Patient presented with creatinine elevated to 3.17 Gradually improved with IV fluid but worsening again in last 48 hours.  She is on IV hydration.  I will continue the same.  Elevated troponin Likely demand ischemia due to sepsis.  No real chest pain.  History of CHF PTA on Coreg 6.25 mg twice daily, Imdur 15 mg daily Meds were initially held because of hypotension.  Coreg has been resumed at a lower dose of 3.125 mg twice daily.  Imdur remains on hold.  Blood pressure in normal range now. Echo with EF 60 to 65%, no wall motion abnormality, grade 1 diastolic dysfunction, no significant change since May 2022.  PE in July 2023 04/22/2022, CTPA showed solitary small nonocclusive thrombus in the right lower lobe pulmonary artery at the segmental branching level.  At that time, she was started on Eliquis which she could not continue prolonged because of hematuria.  Per patient, she has been started back on aspirin and Eliquis by her PCP.  It seems hematuria started again at home.  Current both Eliquis and aspirin on hold at this time.  No longer having hematuria at this time.  Chronic  anemia It seems that patient's hemoglobin was more than 12 till May 2023.  It has been gradually trending down since then.  In last hospitalization, hemoglobin was low at 7.1, received 2 units transfusion and was discharged with a hemoglobin of 11.  Patient presented with hemoglobin at 9 this time.  No active bleeding but hemoglobin is running less than 8 since yesterday.  FOBT negative.  Continue to monitor.  May need transfusion if hemoglobin is less than 7.  CAD/HLD PTA on Coreg, Imdur, Crestor 40 mg daily, Zetia 10 mg daily  GERD PPI  anxiety depression PTA on trazodone, Zoloft  Urothelial cancer S/p right distal ureter and underwent right nephro ureterectomy several years ago Recurrence in left ureter in 2020 s/p focal ablation Recurrence in left calyceal system in 2022.  Was getting chemotherapy to nephrostomy tube under the care of Dr. Diona Fanti.  It was stopped because of intolerance and left ureteral obstruction S/p left ureter DJ stent 03/23/2022 Continue Ditropan Discussed with Dr. Diona Fanti this morning.  KUB  was obtained which showed the stent in position.  No other abnormality seen.  Mobility PT eval.  Goals of care -  Code Status: Full Code    Diet:  Diet Order             Diet Heart Room service appropriate? Yes; Fluid consistency: Thin  Diet effective now                  DVT prophylaxis:  Place and maintain sequential compression device Start: 06/04/22 1358   Antimicrobials: Ciprofloxacin 250 mg daily. Fluid: Continue normal saline Consultants: None at this time Family Communication: Family not at bedside  Status is: Inpatient  Continue in-hospital care because: Labs not improving yet. Level of care: Telemetry   Dispo: The patient is from: Home              Anticipated d/c is to: Pending clinical course.  No PT follow-up required per eval              Patient currently is not medically stable to d/c.   Difficult to place patient  No     Infusions:     Scheduled Meds:  calcium carbonate  1 tablet Oral TID WC   ciprofloxacin  250 mg Oral Daily   ezetimibe  10 mg Oral Daily   famotidine  10 mg Oral QODAY   feeding supplement  1 Container Oral TID BM   feeding supplement  237 mL Oral BID BM   rosuvastatin  10 mg Oral q morning   sertraline  50 mg Oral q morning   traZODone  150 mg Oral QHS    PRN meds: acetaminophen **OR** acetaminophen, albuterol, hydrALAZINE, lip balm, ondansetron (ZOFRAN) IV, oxybutynin, polyethylene glycol   Antimicrobials: Anti-infectives (From admission, onward)    Start     Dose/Rate Route Frequency Ordered Stop   06/02/22 1100  ciprofloxacin (CIPRO) tablet 250 mg        250 mg Oral Daily 06/02/22 0943 06/08/22 0959   05/31/22 1100  ceFEPIme (MAXIPIME) 2 g in sodium chloride 0.9 % 100 mL IVPB  Status:  Discontinued        2 g 200 mL/hr over 30 Minutes Intravenous Every 24 hours 05/30/22 1518 06/02/22 0943   05/30/22 1517  vancomycin variable dose per unstable renal function (pharmacist dosing)  Status:  Discontinued         Does not apply See admin instructions 05/30/22 1518 05/31/22 1214   05/30/22 1030  ceFEPIme (MAXIPIME) 2 g in sodium chloride 0.9 % 100 mL IVPB        2 g 200 mL/hr over 30 Minutes Intravenous  Once 05/30/22 1024 05/30/22 1125   05/30/22 1030  vancomycin (VANCOREADY) IVPB 1500 mg/300 mL        1,500 mg 150 mL/hr over 120 Minutes Intravenous  Once 05/30/22 1024 05/30/22 1242       Objective: Vitals:   06/04/22 0540 06/04/22 1405  BP: (!) 142/66 (!) 144/74  Pulse: 79 74  Resp: 18 18  Temp: 98.8 F (37.1 C) 98.4 F (36.9 C)  SpO2: 96% 98%    Intake/Output Summary (Last 24 hours) at 06/04/2022 1419 Last data filed at 06/04/2022 0900 Gross per 24 hour  Intake 3041.22 ml  Output --  Net 3041.22 ml    Filed Weights   05/30/22 1427  Weight: 74.4 kg   Weight change:  Body mass index is 29.05 kg/m.   Physical Exam: General exam: Pleasant,  elderly  Caucasian female.  Not in physical distress Skin: No rashes, lesions or ulcers. HEENT: Atraumatic, normocephalic, no obvious bleeding Lungs: Clear to auscultation bilaterally CVS: Regular rate and rhythm, no murmur GI/Abd soft, no abdominal tenderness, bowel sound present CNS: Alert, awake, oriented x3 Psychiatry: Mood appropriate Extremities: No pedal edema, no calf tenderness  Data Review: I have personally reviewed the laboratory data and studies available.  F/u labs ordered Unresulted Labs (From admission, onward)    None       Signed, Terrilee Croak, MD [facility] 06/04/2022

## 2022-06-04 NOTE — Progress Notes (Signed)
Have alerted MD of Hgb this morning of 6.9 New order received and noted.

## 2022-06-04 NOTE — Progress Notes (Signed)
PROGRESS NOTE  [name]  DOB: April 18, 1946  PCP: [name], MD IWP:[id number]  DOA: 05/30/2022  LOS: 5 days  Hospital Day: 6  Brief narrative: [name] is a 76 y.o. female with PMH significant for urothelial cancer, HTN, HLD, CAD, CHF, CKD, PE in July 2023, GERD, anxiety depression. Patient has history of urothelial cancer.  Several years ago, she had high-grade urothelial carcinoma of the right distal ureter and underwent right nephro ureterectomy.  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  It was then treated with laser ablation.  In 2022, she was found to have a small volume recurrence in the left calyceal system.  It was treated with focal ablation but also with injection of Jelmyto in the left collecting system through her nephrostomy tube.  She was started on her second round of chemotherapy which was supposed to go for 6 weeks but she was not able to continue that for more than 3 weeks because of significant pain and left ureteral obstruction. On 03/23/2022, she underwent removal of nephrostomy tube and placement of DJ stent on the left.  Subsequently she had UTI as an outpatient twice treated with antibiotics. 7/14 to 7/19, patient was hospitalized with fever, nausea, vomiting, generalized weakness.  She was noted to have sepsis secondary to left-sided pyelonephritis.  Blood culture grew staph hominis and capitis as well as Staph epidermidis.  Per ID recommendation, she was treated with IV vancomycin and was discharged on 9 days of oral Zyvox which she completed post discharge.  Ureteral stent was left in place.  On that hospitalization, patient was also found to have a small right lower lobe pulm embolism and was started on Eliquis following with long-term aspirin.  Patient states that since starting Eliquis, she started to get hematuria. Patient was recently hospitalized 8/7 to 8/8  for low hemoglobin, FOBT positive and urinalysis with RBCs.  She underwent 2 units of blood transfusion.  [facility] and urologist Dr. Gloriann Loan were consulted.  Hemoglobin stabilized and she was discharged without intervention.  Patient was advised to stop baby aspirin and any other NSAIDs. In the interval, she saw her PCP and was resumed again on Eliquis at half dose of 2.5 mg twice daily and a baby aspirin.  She has started to notice hematuria again. She was in her usual state of health until last night.  Per husband, last night she started to feel warm and had chills.  Around 3 AM this morning, she started having nausea and vomited twice.  She started to get weak and hence EMS was called. EMS noted lobe pressure and give normal saline bolus in route to the ED.  In the ED, patient was afebrile, heart rate in 80s, had a low blood pressure in 80s, breathing on room air Labs with BUN/creatinine elevated to 32/3.17, troponin elevated to 230, lactic acid elevated to 3.7 WBC count elevated to 21.6, hemoglobin low at 9.1, glucose elevated to 120 Urinalysis with cloudy yellow urine with moderate amount of hemoglobin, large amount of leukocytes, positive nitrite, many bacteria EKG in normal sinus rhythm with QTc 459 ms Admitted to hospitalist service Urine cultures sent. Blood culture is showing preliminary growth of gram-positive cocci in anaerobic bottle only. See below for details  Subjective: Patient was seen and examined this morning.   Propped up in bed.  Not in distress.  No new symptoms.  Husband not at bedside.  Assessment/Plan: Sepsis -POA UTI in the presence of left DJ stent Presented with chills, nausea, vomiting, leukocytosis, lactic acidosis and AKI Recent hospitalization for left pyelonephritis, coagulase negative staph bacteremia. Urinalysis on admission positive for leukocytes and nitrite and many bacteria.   Initially started on broad-spectrum IV antibiotics Urine culture grew 30,000  CFU per mL of Citrobacter.  ID consult was obtained.  Switch to ciprofloxacin oral.  In last 24 hours, WBC count gradually improving.  No fever Recent Labs  Lab 05/30/22 1000 05/30/22 1246 05/31/22 0626 06/01/22 0601 06/02/22 0524 06/03/22 0606 06/04/22 0820  WBC 21.6*  --  25.7* 15.5* 13.9* 15.1* 14.7*  LATICACIDVEN 3.7* 1.3  --   --   --   --   --      Staph hominis in blood 1 out of 4 blood culture report grew Staphylococcus hominis.  Patient had recent bacteremia with a Staph hominis in July as well.  ID consult was obtained.  1/4 positive blood culture was regarded as a contaminant.  Currently not on IV vancomycin.   AKI on CKD 3B Creatinine at baseline close to 1.5.  Patient presented with creatinine elevated to 3.17 Gradually improved with IV fluid but worsened again in last 48 hours.  3.1 on today given adequate IV hydration.   Recent Labs    04/25/22 0437 04/26/22 0425 05/15/22 1617 05/16/22 0026 05/30/22 1000 05/31/22 0626 06/01/22 0601 06/02/22 0524 06/03/22 0606 06/04/22 0820  BUN '20 20 12 12 '$ 32* 35* 37* 34* 38* 34*  CREATININE 1.38* 1.40* 1.44* 1.43* 3.17* 2.57* 2.83* 2.91* 3.09* 3.11*    Hyperkalemia Potassium elevated to 5.2 yesterday and today.  Unclear etiology.  Probably related to renal failure.  Not on regular potassium supplement.  I will give 1 dose of Lokelma. Recent Labs  Lab 05/31/22 0626 06/01/22 0601 06/02/22 0524 06/03/22 0606 06/04/22 0820  K 4.6 4.2 4.5 5.2* 5.2*  MG 1.8  --   --   --   --   PHOS 4.6  --   --   --   --     Acute on chronic anemia It seems that patient's hemoglobin was more than 12 till May 2023.  It has been gradually trending down since then.  In last hospitalization, hemoglobin was low at 7.1, received 2 units transfusion and was discharged with a hemoglobin of 11.  Patient presented with hemoglobin at 9 this time.  No active bleeding but hemoglobin is gradually falling down.  FOBT negative.  It is down to 6.9 today.   No active bleeding.  Patient gotten 9 L of IV fluid since admission.  I believe drop in hemoglobin is due to dilution.  Discussed with patient and her husband.  We will give 1 unit of PRBC transfusion. Recent Labs    05/16/22 0026 05/16/22 1339 05/31/22 0626 06/01/22 0601 06/02/22 0524 06/03/22 0606 06/04/22 0820  HGB 7.1*   < > 7.4* 7.9* 7.5* 7.4* 6.9*  MCV 97.5   < > 97.2 96.3 95.8 98.4 97.9  TIBC 259  --   --   --   --   --   --   IRON 38  --   --   --   --   --   --    < > = values in this interval not displayed.    Elevated troponin Likely demand ischemia due to sepsis.  No real chest pain.  HypOtension Blood pressure low in 80s  at presentation.  Improving gradually with IV fluid.  Continue to monitor on IV fluid Keep BP meds on hold  History of CHF PTA on Coreg 6.25 mg twice daily, Imdur 15 mg daily Meds on hold. Blood pressure in normal range now. Echo with EF 60 to 65%, no wall motion abnormality, grade 1 diastolic dysfunction, no significant change since May 2022.  CAD/HLD PTA on Coreg, Imdur, Crestor 40 mg daily, Zetia 10 mg daily Continue Crestor and Zetia.  PE in July 2023 04/22/2022, CTPA showed solitary small nonocclusive thrombus in the right lower lobe pulmonary artery at the segmental branching level.  At that time, she was started on Eliquis which she could not continue prolonged because of hematuria.  Per patient, she has been started back on aspirin and Eliquis by her PCP.  It seems hematuria started again at home.  Current both Eliquis and aspirin on hold at this time.  No longer having hematuria at this time.  GERD PPI  anxiety depression PTA on trazodone, Zoloft  Urothelial cancer S/p right distal ureter and underwent right nephro ureterectomy several years ago Recurrence in left ureter in 2020 s/p focal ablation Recurrence in left calyceal system in 2022.  Was getting chemotherapy to nephrostomy tube under the care of Dr. Diona Fanti.  It was stopped  because of intolerance and left ureteral obstruction S/p left ureter DJ stent 03/23/2022 Continue Ditropan Discussed with Dr. Diona Fanti.  KUB was obtained which showed the stent in position.  No other abnormality seen.  Mobility PT eval.  Goals of care -  Code Status: Full Code    Diet:  Diet Order             Diet Heart Room service appropriate? Yes; Fluid consistency: Thin  Diet effective now                  DVT prophylaxis:  Place and maintain sequential compression device Start: 06/04/22 1358   Antimicrobials: Ciprofloxacin 250 mg daily. Fluid: Stop IV fluid Consultants: None at this time Family Communication: Family not at bedside.  Called and updated her husband on the phone.  Status is: Inpatient  Continue in-hospital care because: Labs not improving Level of care: Telemetry   Dispo: The patient is from: Home              Anticipated d/c is to: Pending clinical course.  No PT follow-up required per eval              Patient currently is not medically stable to d/c.   Difficult to place patient No     Infusions:     Scheduled Meds:  calcium carbonate  1 tablet Oral TID WC   ciprofloxacin  250 mg Oral Daily   ezetimibe  10 mg Oral Daily   famotidine  10 mg Oral QODAY   feeding supplement  1 Container Oral TID BM   feeding supplement  237 mL Oral BID BM   rosuvastatin  10 mg Oral q morning   sertraline  50 mg Oral q morning   traZODone  150 mg Oral QHS    PRN meds: acetaminophen **OR** acetaminophen, albuterol, hydrALAZINE, lip balm, ondansetron (ZOFRAN) IV, oxybutynin, polyethylene glycol   Antimicrobials: Anti-infectives (From admission, onward)    Start     Dose/Rate Route Frequency Ordered Stop   06/02/22 1100  ciprofloxacin (CIPRO) tablet 250 mg        250 mg Oral Daily 06/02/22 0943 06/08/22 0959   05/31/22  1100  ceFEPIme (MAXIPIME) 2 g in sodium chloride 0.9 % 100 mL IVPB  Status:  Discontinued        2 g 200 mL/hr over 30 Minutes  Intravenous Every 24 hours 05/30/22 1518 06/02/22 0943   05/30/22 1517  vancomycin variable dose per unstable renal function (pharmacist dosing)  Status:  Discontinued         Does not apply See admin instructions 05/30/22 1518 05/31/22 1214   05/30/22 1030  ceFEPIme (MAXIPIME) 2 g in sodium chloride 0.9 % 100 mL IVPB        2 g 200 mL/hr over 30 Minutes Intravenous  Once 05/30/22 1024 05/30/22 1125   05/30/22 1030  vancomycin (VANCOREADY) IVPB 1500 mg/300 mL        1,500 mg 150 mL/hr over 120 Minutes Intravenous  Once 05/30/22 1024 05/30/22 1242       Objective: Vitals:   06/04/22 0540 06/04/22 1405  BP: (!) 142/66 (!) 144/74  Pulse: 79 74  Resp: 18 18  Temp: 98.8 F (37.1 C) 98.4 F (36.9 C)  SpO2: 96% 98%    Intake/Output Summary (Last 24 hours) at 06/04/2022 1419 Last data filed at 06/04/2022 0900 Gross per 24 hour  Intake 3041.22 ml  Output --  Net 3041.22 ml    Filed Weights   05/30/22 1427  Weight: 74.4 kg   Weight change:  Body mass index is 29.05 kg/m.   Physical Exam: General exam: Pleasant, elderly Caucasian female.  Not in physical distress Skin: No rashes, lesions or ulcers. HEENT: Atraumatic, normocephalic, no obvious bleeding Lungs: Clear to auscultation bilaterally CVS: Regular rate and rhythm, no murmur GI/Abd soft, no abdominal tenderness, bowel sound present CNS: Alert, awake, oriented x3 Psychiatry: Mood appropriate Extremities: No pedal edema, no calf tenderness  Data Review: I have personally reviewed the laboratory data and studies available.  F/u labs ordered Unresulted Labs (From admission, onward)    None       Signed, Terrilee Croak, MD [facility] 06/04/2022

## 2022-06-05 ENCOUNTER — Inpatient Hospital Stay (HOSPITAL_COMMUNITY): Payer: Medicare PPO

## 2022-06-05 DIAGNOSIS — N179 Acute kidney failure, unspecified: Secondary | ICD-10-CM | POA: Diagnosis not present

## 2022-06-05 LAB — URINALYSIS, ROUTINE W REFLEX MICROSCOPIC
Bilirubin Urine: NEGATIVE
Glucose, UA: NEGATIVE mg/dL
Ketones, ur: NEGATIVE mg/dL
Nitrite: NEGATIVE
Protein, ur: NEGATIVE mg/dL
RBC / HPF: >50 RBC/hpf — ABNORMAL HIGH (ref 0–5)
Specific Gravity, Urine: 1.006 (ref 1.005–1.030)
WBC, UA: >50 WBC/hpf — ABNORMAL HIGH (ref 0–5)
pH: 5 (ref 5.0–8.0)

## 2022-06-05 LAB — TYPE AND SCREEN
ABO/RH(D): O POS
Antibody Screen: NEGATIVE
Unit division: 0

## 2022-06-05 LAB — CBC
HCT: 27.8 % — ABNORMAL LOW (ref 36.0–46.0)
Hemoglobin: 8.5 g/dL — ABNORMAL LOW (ref 12.0–15.0)
MCH: 29.9 pg (ref 26.0–34.0)
MCHC: 30.6 g/dL (ref 30.0–36.0)
MCV: 97.9 fL (ref 80.0–100.0)
Platelets: 302 10*3/uL (ref 150–400)
RBC: 2.84 MIL/uL — ABNORMAL LOW (ref 3.87–5.11)
RDW: 18.2 % — ABNORMAL HIGH (ref 11.5–15.5)
WBC: 16 10*3/uL — ABNORMAL HIGH (ref 4.0–10.5)
nRBC: 0.3 % — ABNORMAL HIGH (ref 0.0–0.2)

## 2022-06-05 LAB — BASIC METABOLIC PANEL
Anion gap: 5 (ref 5–15)
BUN: 38 mg/dL — ABNORMAL HIGH (ref 8–23)
CO2: 18 mmol/L — ABNORMAL LOW (ref 22–32)
Calcium: 8 mg/dL — ABNORMAL LOW (ref 8.9–10.3)
Chloride: 116 mmol/L — ABNORMAL HIGH (ref 98–111)
Creatinine, Ser: 3.58 mg/dL — ABNORMAL HIGH (ref 0.44–1.00)
GFR, Estimated: 13 mL/min — ABNORMAL LOW (ref >60)
Glucose, Bld: 90 mg/dL (ref 70–99)
Potassium: 5.1 mmol/L (ref 3.5–5.1)
Sodium: 139 mmol/L (ref 135–145)

## 2022-06-05 LAB — NA AND K (SODIUM & POTASSIUM), RAND UR
Potassium Urine: 12 mmol/L
Sodium, Ur: 72 mmol/L

## 2022-06-05 LAB — BPAM RBC
Blood Product Expiration Date: 202309242359
ISSUE DATE / TIME: 202308271828
Unit Type and Rh: 5100

## 2022-06-05 LAB — HEMOGLOBIN AND HEMATOCRIT, BLOOD
HCT: 27.9 % — ABNORMAL LOW (ref 36.0–46.0)
Hemoglobin: 8.5 g/dL — ABNORMAL LOW (ref 12.0–15.0)

## 2022-06-05 LAB — CREATININE, URINE, RANDOM: Creatinine, Urine: 37 mg/dL

## 2022-06-05 NOTE — Progress Notes (Signed)
PROGRESS NOTE  [name]  DOB: 11-Jan-1946  PCP: Heather Smoker, MD XHB:[id number]  DOA: 05/30/2022  LOS: 6 days  Hospital Day: 7  Brief narrative: [name] is a 76 y.o. female with PMH significant for urothelial cancer, HTN, HLD, CAD, CHF, CKD, PE in July 2023, GERD, anxiety depression. Patient has history of urothelial cancer.  Several years ago, she had high-grade urothelial carcinoma of the right distal ureter and underwent right nephro ureterectomy.  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  It was then treated with laser ablation.  In 2022, she was found to have a small volume recurrence in the left calyceal system.  It was treated with focal ablation but also with injection of Jelmyto in the left collecting system through her nephrostomy tube.  She was started on her second round of chemotherapy which was supposed to go for 6 weeks but she was not able to continue that for more than 3 weeks because of significant pain and left ureteral obstruction. On 03/23/2022, she underwent removal of nephrostomy tube and placement of DJ stent on the left.  Subsequently she had UTI as an outpatient twice treated with antibiotics. 7/14 to 7/19, patient was hospitalized with fever, nausea, vomiting, generalized weakness.  She was noted to have sepsis secondary to left-sided pyelonephritis.  Blood culture grew staph hominis and capitis as well as Staph epidermidis.  Per ID recommendation, she was treated with IV vancomycin and was discharged on 9 days of oral Zyvox which she completed post discharge.  Ureteral stent was left in place.  On that hospitalization, patient was also found to have a small right lower lobe pulm embolism and was started on Eliquis following with long-term aspirin.  Patient states that since starting Eliquis, she started to get hematuria. Patient was recently hospitalized 8/7 to 8/8  for low hemoglobin, FOBT positive and urinalysis with RBCs.  She underwent 2 units of blood transfusion.  [facility] and urologist Dr. Gloriann [name] were consulted.  Hemoglobin stabilized and she was discharged without intervention.  Patient was advised to stop baby aspirin and any other NSAIDs. In the interval, she saw her PCP and was resumed again on Eliquis at half dose of 2.5 mg twice daily and a baby aspirin.  She has started to notice hematuria again. She was in her usual state of health until last night.  Per husband, last night she started to feel warm and had chills.  Around 3 AM this morning, she started having nausea and vomited twice.  She started to get weak and hence EMS was called. EMS noted lobe pressure and give normal saline bolus in route to the ED.  In the ED, patient was afebrile, heart rate in 80s, had a low blood pressure in 80s, breathing on room air Labs with BUN/creatinine elevated to 32/3.17, troponin elevated to 230, lactic acid elevated to 3.7 WBC count elevated to 21.6, hemoglobin low at 9.1, glucose elevated to 120 Urinalysis with cloudy yellow urine with moderate amount of hemoglobin, large amount of leukocytes, positive nitrite, many bacteria EKG in normal sinus rhythm with QTc 459 ms Admitted to hospitalist service Urine cultures sent. Blood culture is showing preliminary growth of gram-positive cocci in anaerobic bottle only. See below for details  Subjective: Patient was seen and examined this morning.   Propped up in bed.   Not in distress.  Patient clinically looks  stable for last few days.  But her labs are not improving.  Today her white count is up to 16,000, creatinine continues to rise further.  Assessment/Plan: Sepsis -POA UTI in the presence of left DJ stent Presented with chills, nausea, vomiting, leukocytosis, lactic acidosis and AKI Recent hospitalization for left pyelonephritis, coagulase negative staph bacteremia. Urinalysis on admission positive for  leukocytes and nitrite and many bacteria.   Initially started on broad-spectrum IV antibiotics Urine culture grew 30,000 CFU per mL of Citrobacter.  ID consult was obtained.  Switch to ciprofloxacin oral.  In last 24 hours, WBC count has gradually worsened again to 16,000. Recent Labs  Lab 05/30/22 1000 05/30/22 1246 05/31/22 0626 06/01/22 0601 06/02/22 0524 06/03/22 0606 06/04/22 0820 06/05/22 0501  WBC 21.6*  --    < > 15.5* 13.9* 15.1* 14.7* 16.0*  LATICACIDVEN 3.7* 1.3  --   --   --   --   --   --    < > = values in this interval not displayed.     Staph hominis in blood 1 out of 4 blood culture report grew Staphylococcus hominis.  Patient had recent bacteremia with a Staph hominis in July as well.  ID consult was obtained.  1/4 positive blood culture was regarded as a contaminant.  Currently not on IV vancomycin.   AKI on CKD 3B Creatinine at baseline close to 1.5.  Patient presented with creatinine elevated to 3.17 Gradually improved with IV fluid but it is worsening again in last 72 hours.  3.5 today.  We will call nephrology consultation today. Recent Labs    04/26/22 0425 05/15/22 1617 05/16/22 0026 05/30/22 1000 05/31/22 0626 06/01/22 0601 06/02/22 0524 06/03/22 0606 06/04/22 0820 06/05/22 0500  BUN '20 12 12 '$ 32* 35* 37* 34* 38* 34* 38*  CREATININE 1.40* 1.44* 1.43* 3.17* 2.57* 2.83* 2.91* 3.09* 3.11* 3.58*    Hyperkalemia Potassium elevated to 5.2 yesterday and today.  Unclear etiology.  Probably related to renal failure.  Not on regular potassium supplement.  I gave her 1 dose of Lokelma yesterday with improvement Recent Labs  Lab 05/31/22 0626 06/01/22 0601 06/02/22 0524 06/03/22 0606 06/04/22 0820 06/05/22 0500  K 4.6 4.2 4.5 5.2* 5.2* 5.1  MG 1.8  --   --   --   --   --   PHOS 4.6  --   --   --   --   --      Acute on chronic anemia It seems that patient's hemoglobin was more than 12 till May 2023.  It has been gradually trending down since then.   In last hospitalization, hemoglobin was low at 7.1, received 2 units transfusion and was discharged with a hemoglobin of 11.  Patient presented with hemoglobin at 9 this time.  No active bleeding but hemoglobin is gradually falling down.  FOBT negative.  Hemoglobin was low at 6.9 yesterday 8/27.  1 unit of PRBC transfusion was given.  Hemoglobin improved 8.5 today.  Recent Labs    05/16/22 0026 05/16/22 1339 06/01/22 0601 06/02/22 0524 06/03/22 0606 06/04/22 0820 06/05/22 0501  HGB 7.1*   < > 7.9* 7.5* 7.4* 6.9* 8.5*  8.5*  MCV 97.5   < > 96.3 95.8 98.4 97.9 97.9  TIBC 259  --   --   --   --   --   --   IRON 38  --   --   --   --   --   --    < > =  values in this interval not displayed.    Elevated troponin Likely demand ischemia due to sepsis.  No real chest pain.  HypOtension Blood pressure low in 80s at presentation.  Improving gradually with IV fluid.  Continue to monitor on IV fluid Keep BP meds on hold  History of CHF PTA on Coreg 6.25 mg twice daily, Imdur 15 mg daily Meds on hold. Blood pressure in normal range now. Echo with EF 60 to 65%, no wall motion abnormality, grade 1 diastolic dysfunction, no significant change since May 2022.  CAD/HLD PTA on Coreg, Imdur, Crestor 40 mg daily, Zetia 10 mg daily Continue Crestor and Zetia.  PE in July 2023 04/22/2022, CTPA showed solitary small nonocclusive thrombus in the right lower lobe pulmonary artery at the segmental branching level.  At that time, she was started on Eliquis which she could not continue prolonged because of hematuria.  Per patient, she has been started back on aspirin and Eliquis by her PCP.  It seems hematuria started again at home.  Current both Eliquis and aspirin on hold at this time.  No longer having hematuria at this time.  GERD PPI  anxiety depression PTA on trazodone, Zoloft  Urothelial cancer S/p right distal ureter and underwent right nephro ureterectomy several years ago Recurrence in  left ureter in 2020 s/p focal ablation Recurrence in left calyceal system in 2022.  Was getting chemotherapy to nephrostomy tube under the care of Dr. Diona Fanti.  It was stopped because of intolerance and left ureteral obstruction S/p left ureter DJ stent 03/23/2022 Continue Ditropan Discussed with Dr. Diona Fanti.  KUB was obtained which showed the stent in position.  No other abnormality seen.  Mobility PT eval.  Goals of care -  Code Status: Full Code    Diet:  Diet Order             Diet Heart Room service appropriate? Yes; Fluid consistency: Thin  Diet effective now                  DVT prophylaxis:  Place and maintain sequential compression device Start: 06/04/22 1358   Antimicrobials: Ciprofloxacin 250 mg daily. Fluid: Not on IV fluid today.  Defer to nephrology Consultants: None at this time Family Communication: Family not at bedside.  Called and updated her husband on the phone yesterday.  Status is: Inpatient  Continue in-hospital care because: Labs not improving, continue to worsen Level of care: Telemetry   Dispo: The patient is from: Home              Anticipated d/c is to: Pending clinical course.  No PT follow-up required per eval              Patient currently is not medically stable to d/c.   Difficult to place patient No     Infusions:     Scheduled Meds:  calcium carbonate  1 tablet Oral TID WC   ciprofloxacin  250 mg Oral Daily   ezetimibe  10 mg Oral Daily   famotidine  10 mg Oral QODAY   feeding supplement  1 Container Oral TID BM   feeding supplement  237 mL Oral BID BM   rosuvastatin  10 mg Oral q morning   sertraline  50 mg Oral q morning   traZODone  150 mg Oral QHS    PRN meds: acetaminophen **OR** acetaminophen, albuterol, hydrALAZINE, lip balm, ondansetron (ZOFRAN) IV, oxybutynin, polyethylene glycol   Antimicrobials: Anti-infectives (From admission, onward)  Start     Dose/Rate Route Frequency Ordered Stop   06/02/22  1100  ciprofloxacin (CIPRO) tablet 250 mg        250 mg Oral Daily 06/02/22 0943 06/08/22 0959   05/31/22 1100  ceFEPIme (MAXIPIME) 2 g in sodium chloride 0.9 % 100 mL IVPB  Status:  Discontinued        2 g 200 mL/hr over 30 Minutes Intravenous Every 24 hours 05/30/22 1518 06/02/22 0943   05/30/22 1517  vancomycin variable dose per unstable renal function (pharmacist dosing)  Status:  Discontinued         Does not apply See admin instructions 05/30/22 1518 05/31/22 1214   05/30/22 1030  ceFEPIme (MAXIPIME) 2 g in sodium chloride 0.9 % 100 mL IVPB        2 g 200 mL/hr over 30 Minutes Intravenous  Once 05/30/22 1024 05/30/22 1125   05/30/22 1030  vancomycin (VANCOREADY) IVPB 1500 mg/300 mL        1,500 mg 150 mL/hr over 120 Minutes Intravenous  Once 05/30/22 1024 05/30/22 1242       Objective: Vitals:   06/04/22 2125 06/05/22 0656  BP: (!) 159/65 (!) 160/74  Pulse: 93 83  Resp:    Temp: 97.8 F (36.6 C) 98.1 F (36.7 C)  SpO2: 98% 97%    Intake/Output Summary (Last 24 hours) at 06/05/2022 1311 Last data filed at 06/05/2022 1100 Gross per 24 hour  Intake 1184 ml  Output --  Net 1184 ml    Filed Weights   05/30/22 1427 06/04/22 1739  Weight: 74.4 kg (!) 178 kg   Weight change:  Body mass index is 69.53 kg/m.   Physical Exam: General exam: Pleasant, elderly Caucasian female.  Not in physical distress Skin: No rashes, lesions or ulcers. HEENT: Atraumatic, normocephalic, no obvious bleeding Lungs: Clear to auscultation bilaterally CVS: Regular rate and rhythm, no murmur GI/Abd soft, no abdominal tenderness, bowel sound present CNS: Alert, awake, oriented x3 Psychiatry: Mood appropriate Extremities: No pedal edema, no calf tenderness  Data Review: I have personally reviewed the laboratory data and studies available.  F/u labs ordered Unresulted Labs (From admission, onward)    None       Signed, Terrilee Croak, MD [facility] 06/05/2022

## 2022-06-05 NOTE — Consult Note (Signed)
Reason for Consult: Renal failure Referring Physician:  Dr. Pietro Cassis  Chief Complaint: Nausea, vomiting  Assessment/Plan: Acute kidney injury on CKD 3 w/ a baseline cr 1.3-1.5. Unclear etiology of AKI as by history and PE the patient does not appear +10.5 L. May have ATN from the UTI vs obstructive uropathy. I would think it's too early for AIN and her renal function was already worsening immediately upon admission. - Strict I&O's, daily weights preferably on a floor scale - Repeat renal ultrasound to ensure hydro is not worsening especially in the setting of a solitary kidney. CXR as well - Will hold off on giving more fluids for now pending studies. - Urine studies as well + will spin the urine and look for pigmented casts.  -Maintain MAP>65 for optimal renal perfusion; actually hypertensive at this point.  - Avoid nephrotoxic agents such as IV contrast, NSAIDs, and phosphate containing bowel preps (FLEETS)  UTI growing Citrobacter currently on ciprofloxacin. Could the stent be the source of the infection? J stent is still present. Aneimia - unable to give IV iron w active infection. Transfuse if symptomatic or if Hb drops below 7.  Initially hypotenstive but now hypertensive being appropriately treated. H/o PE - but Eliquis and ASA on hold for hematuria Urothelial cancer   HPI: [name] is an 76 y.o. female HTN, HLD, CASHD, dCHF, PE 04/2022, right nephro ureterectomy 2006 for high grade urothelial carcinoma, left sided urothelial cancer (renal pelvis and ureter) discovered 05/2019 treated with laser ablation. Then reucrrence on left side in 2022 ltx initially with Jelmyto d/c bec of obstruction. S/p  double J stent + nephrostogram through left nephrostomy tube + removal of nephrostomy tube on 03/23/2022. Patient recently admitted  7/14-7/19 with Staph hominis bacteremia tx with Vancomycin transitioning to linezolid. Patient also with cictrobacter freundii + urine cultures. Patient was also  on Eliquis for a PE discovered during that hospitalization. Patient now presenting with nausea, vomiting, anorexia, fatigue found to be afebrile in the ED but SBP was in the 80's. BUN/Cr was also 35/2.57  with a WBC of 21.6. Cr was in the 1.3-1.5 range in 04/21/22 - 05/16/22. U/A showed large LE, +nitrites, >50RBC >50WBC, many bacteria. Patient treated with Cefepime for only 3 days, Vancomycin x1 dose and then cipro 250 daily. Ultrasound showed normal cortical thickness, slightly increased cortical echogenicity, mild LEFT hydronephrosis. Patient is +10.6 L during this hospitalization.  ROS Pertinent items are noted in HPI.  Chemistry and CBC: Creatinine, Ser  Date/Time Value Ref Range Status  06/05/2022 05:00 AM 3.58 (H) 0.44 - 1.00 mg/dL Final  06/04/2022 08:20 AM 3.11 (H) 0.44 - 1.00 mg/dL Final  06/03/2022 06:06 AM 3.09 (H) 0.44 - 1.00 mg/dL Final  06/02/2022 05:24 AM 2.91 (H) 0.44 - 1.00 mg/dL Final  06/01/2022 06:01 AM 2.83 (H) 0.44 - 1.00 mg/dL Final  05/31/2022 06:26 AM 2.57 (H) 0.44 - 1.00 mg/dL Final  05/30/2022 10:00 AM 3.17 (H) 0.44 - 1.00 mg/dL Final  05/16/2022 12:26 AM 1.43 (H) 0.44 - 1.00 mg/dL Final  05/15/2022 04:17 PM 1.44 (H) 0.44 - 1.00 mg/dL Final  04/26/2022 04:25 AM 1.40 (H) 0.44 - 1.00 mg/dL Final  04/25/2022 04:37 AM 1.38 (H) 0.44 - 1.00 mg/dL Final  04/24/2022 04:07 AM 1.62 (H) 0.44 - 1.00 mg/dL Final  04/23/2022 03:46 AM 1.67 (H) 0.44 - 1.00 mg/dL Final  04/22/2022 05:00 AM 1.29 (H) 0.44 - 1.00 mg/dL Final  04/21/2022 09:39 PM 1.26 (H) 0.44 - 1.00 mg/dL Final  04/21/2022  07:24 PM 1.39 (H) 0.44 - 1.00 mg/dL Final  03/23/2022 12:22 PM 1.60 (H) 0.44 - 1.00 mg/dL Final  03/11/2022 04:00 PM 1.41 (H) 0.44 - 1.00 mg/dL Final  03/09/2022 12:41 PM 1.30 (H) 0.44 - 1.00 mg/dL Final  03/02/2022 07:55 PM 1.78 (H) 0.44 - 1.00 mg/dL Final  02/06/2022 11:45 AM 1.44 (H) 0.44 - 1.00 mg/dL Final  08/10/2021 08:35 AM 1.20 (H) 0.57 - 1.00 mg/dL Final  07/06/2021 12:30 PM 1.35 (H)  0.44 - 1.00 mg/dL Final  05/02/2021 06:33 AM 1.30 (H) 0.44 - 1.00 mg/dL Final  09/16/2020 06:41 AM 1.10 (H) 0.44 - 1.00 mg/dL Final  05/10/2020 06:04 AM 1.40 (H) 0.44 - 1.00 mg/dL Final  11/17/2019 07:51 AM 1.30 (H) 0.44 - 1.00 mg/dL Final  11/04/2019 10:58 AM 1.31 (H) 0.57 - 1.00 mg/dL Final  08/12/2019 10:40 AM 1.22 (H) 0.44 - 1.00 mg/dL Final  07/14/2019 09:54 AM 1.67 (H) 0.57 - 1.00 mg/dL Final  07/02/2019 04:32 AM 1.77 (H) 0.44 - 1.00 mg/dL Final  07/01/2019 04:43 AM 1.56 (H) 0.44 - 1.00 mg/dL Final  06/30/2019 07:38 AM 1.72 (H) 0.44 - 1.00 mg/dL Final  06/29/2019 05:44 AM 1.83 (H) 0.44 - 1.00 mg/dL Final  06/28/2019 03:50 AM 1.86 (H) 0.44 - 1.00 mg/dL Final  06/27/2019 04:22 AM 2.01 (H) 0.44 - 1.00 mg/dL Final  06/26/2019 05:59 PM 2.03 (H) 0.44 - 1.00 mg/dL Final  06/26/2019 05:35 AM 2.20 (H) 0.44 - 1.00 mg/dL Final  06/25/2019 08:35 PM 2.37 (H) 0.44 - 1.00 mg/dL Final  06/25/2019 02:00 PM 2.27 (H) 0.44 - 1.00 mg/dL Final  06/25/2019 06:22 AM 2.41 (H) 0.44 - 1.00 mg/dL Final  06/24/2019 06:48 PM 2.54 (H) 0.44 - 1.00 mg/dL Final   Recent Labs  Lab 05/30/22 1000 05/31/22 0626 06/01/22 0601 06/02/22 0524 06/03/22 0606 06/04/22 0820 06/05/22 0500  NA 139 142 137 142 144 138 139  K 4.0 4.6 4.2 4.5 5.2* 5.2* 5.1  CL 106 114* 112* 118* 119* 117* 116*  CO2 23 20* 18* 20* 20* 17* 18*  GLUCOSE 120* 92 102* 90 95 88 90  BUN 32* 35* 37* 34* 38* 34* 38*  CREATININE 3.17* 2.57* 2.83* 2.91* 3.09* 3.11* 3.58*  CALCIUM 8.4* 7.8* 7.6* 8.1* 8.3* 7.8* 8.0*  PHOS  --  4.6  --   --   --   --   --    Recent Labs  Lab 05/30/22 1000 05/31/22 0626 06/02/22 0524 06/03/22 0606 06/04/22 0820 06/05/22 0501  WBC 21.6*   < > 13.9* 15.1* 14.7* 16.0*  NEUTROABS 19.8*  --   --  11.0*  --   --   HGB 9.1*   < > 7.5* 7.4* 6.9* 8.5*  8.5*  HCT 29.1*   < > 25.1* 25.0* 23.4* 27.8*  27.9*  MCV 94.5   < > 95.8 98.4 97.9 97.9  PLT 312   < > 324 309 286 302   < > = values in this interval not  displayed.   Liver Function Tests: Recent Labs  Lab 05/30/22 1000  AST 29  ALT 26  ALKPHOS 85  BILITOT 0.6  PROT 6.7  ALBUMIN 2.2*   Recent Labs  Lab 05/30/22 1000  LIPASE 27   No results for input(s): "AMMONIA" in the last 168 hours. Cardiac Enzymes: Recent Labs  Lab 05/30/22 1000  CKTOTAL 40   Iron Studies: No results for input(s): "IRON", "TIBC", "TRANSFERRIN", "FERRITIN" in the last 72 hours. PT/INR: '@LABRCNTIP'$ (inr:5)  Xrays/Other Studies: )  Results for orders placed or performed during the hospital encounter of 05/30/22 (from the past 48 hour(s))  CBC     Status: Abnormal   Collection Time: 06/04/22  8:20 AM  Result Value Ref Range   WBC 14.7 (H) 4.0 - 10.5 K/uL   RBC 2.39 (L) 3.87 - 5.11 MIL/uL   Hemoglobin 6.9 (LL) 12.0 - 15.0 g/dL    Comment: This critical result has verified and been called to [place]oss by Marella Bile on 08 27 2023 at 0854, and has been read back.    HCT 23.4 (L) 36.0 - 46.0 %   MCV 97.9 80.0 - 100.0 fL   MCH 28.9 26.0 - 34.0 pg   MCHC 29.5 (L) 30.0 - 36.0 g/dL   RDW 18.5 (H) 11.5 - 15.5 %   Platelets 286 150 - 400 K/uL   nRBC 0.1 0.0 - 0.2 %    Comment: Performed at [facility], [place] [address] [place], [place] [address]  Basic metabolic panel     Status: Abnormal   Collection Time: 06/04/22  8:20 AM  Result Value Ref Range   Sodium 138 135 - 145 mmol/L   Potassium 5.2 (H) 3.5 - 5.1 mmol/L   Chloride 117 (H) 98 - 111 mmol/L   CO2 17 (L) 22 - 32 mmol/L   Glucose, Bld 88 70 - 99 mg/dL    Comment: Glucose reference range applies only to samples taken after fasting for at least 8 hours.   BUN 34 (H) 8 - 23 mg/dL   Creatinine, Ser 3.11 (H) 0.44 - 1.00 mg/dL   Calcium 7.8 (L) 8.9 - 10.3 mg/dL   GFR, Estimated 15 (L) >60 mL/min    Comment: (NOTE) Calculated using the CKD-EPI Creatinine Equation (2021)    Anion gap 4 (L) 5 - 15    Comment: Performed at [facility], [place] [address],  [place], [place] [address]  Prepare RBC (crossmatch)     Status: None   Collection Time: 06/04/22  2:43 PM  Result Value Ref Range   Order Confirmation      ORDER PROCESSED BY BLOOD BANK Performed at [facility], [place] [address], [place], [place] [address]   Type and screen [place]     Status: None   Collection Time: 06/04/22  4:13 PM  Result Value Ref Range   ABO/RH(D) O POS    Antibody Screen NEG    Sample Expiration 06/07/2022,2359    Unit Number [id number]    Blood Component Type RED CELLS,LR    Unit division 00    Status of Unit ISSUED,FINAL    Transfusion Status OK TO TRANSFUSE    Crossmatch Result      Compatible Performed at [facility], [place] [address], [place], [place] [address]   Basic metabolic panel     Status: Abnormal   Collection Time: 06/05/22  5:00 AM  Result Value Ref Range   Sodium 139 135 - 145 mmol/L   Potassium 5.1 3.5 - 5.1 mmol/L   Chloride 116 (H) 98 - 111 mmol/L   CO2 18 (L) 22 - 32 mmol/L   Glucose, Bld 90 70 - 99 mg/dL    Comment: Glucose reference range applies only to samples taken after fasting for at least 8 hours.   BUN 38 (H) 8 - 23 mg/dL   Creatinine, Ser 3.58 (H) 0.44 - 1.00 mg/dL   Calcium 8.0 (L) 8.9 - 10.3  mg/dL   GFR, Estimated 13 (L) >60 mL/min    Comment: (NOTE) Calculated using the CKD-EPI Creatinine Equation (2021)    Anion gap 5 5 - 15    Comment: Performed at [facility], [place] [address] [place], [place] [address]  Hemoglobin and hematocrit, blood     Status: Abnormal   Collection Time: 06/05/22  5:01 AM  Result Value Ref Range   Hemoglobin 8.5 (L) 12.0 - 15.0 g/dL   HCT 27.9 (L) 36.0 - 46.0 %    Comment: Performed at [facility], [place] [address] [place], [place] [address]  CBC     Status: Abnormal   Collection Time: 06/05/22  5:01 AM  Result Value Ref Range   WBC 16.0 (H) 4.0 - 10.5 K/uL   RBC 2.84 (L) 3.87 - 5.11 MIL/uL    Hemoglobin 8.5 (L) 12.0 - 15.0 g/dL   HCT 27.8 (L) 36.0 - 46.0 %   MCV 97.9 80.0 - 100.0 fL   MCH 29.9 26.0 - 34.0 pg   MCHC 30.6 30.0 - 36.0 g/dL   RDW 18.2 (H) 11.5 - 15.5 %   Platelets 302 150 - 400 K/uL   nRBC 0.3 (H) 0.0 - 0.2 %    Comment: Performed at [facility], [place] [address] [place], [place] [address]   No results found.  PMH:   Past Medical History:  Diagnosis Date   Anxiety    Bilateral swelling of feet 03/11/2022   bilateral feet edema much improved per pt on 03-20-2022, bilateral foot edema resolves after propping both feet up   CAD in native artery    a. cath 10/2019- mild to moderate dx >> medical therapy    Cancer [facility])    left ureteral cancer dx sept 2020   CHF (congestive heart failure) ([place])    Chronic kidney disease 06/2019   acute renal insufficiency sees dr Dorina Hoyer, ckd stage 3 per pt   COVID 09/2020   tired sore throat nausea hair fell out loss of taste and smell x 10 days, all symptoms resolved except still has loss of taste and smell   Depression    GERD (gastroesophageal reflux disease)    Hyperlipidemia    Hypertension     PSH:   Past Surgical History:  Procedure Laterality Date   ABDOMINAL HYSTERECTOMY     partial   bladder-partial removal     CYSTOSCOPY W/ URETERAL STENT PLACEMENT Left 11/17/2019   Procedure: CYSTOSCOPY WITH STENT REPLACEMENT;  Surgeon: Franchot Gallo, MD;  Location: [facility];  Service: Urology;  Laterality: Left;   CYSTOSCOPY W/ URETERAL STENT PLACEMENT Left 03/23/2022   Procedure: CYSTOSCOPY WITH RETROGRADE PYELOGRAM, LEFT URETERAL STENT PLACEMENT;  Surgeon: Franchot Gallo, MD;  Location: [facility];  Service: Urology;  Laterality: Left;   CYSTOSCOPY W/ URETERAL STENT REMOVAL Left 05/10/2020   Procedure: CYSTOSCOPY WITH STENT REMOVAL;  Surgeon: Franchot Gallo, MD;  Location: [facility];  Service: Urology;  Laterality: Left;   CYSTOSCOPY W/  URETERAL STENT REMOVAL Left 05/02/2021   Procedure: CYSTOSCOPY WITH STENT REMOVAL;  Surgeon: Franchot Gallo, MD;  Location: [facility];  Service: Urology;  Laterality: Left;   CYSTOSCOPY WITH RETROGRADE PYELOGRAM, URETEROSCOPY AND STENT PLACEMENT Left 06/27/2019   Procedure: CYSTOSCOPY WITH LEFT RETROGRADE PYELOGRAM, URETEROSCOPY, BIOPSY AND LEFT STENT PLACEMENT;  Surgeon: Irine Seal, MD;  Location: [facility];  Service: Urology;  Laterality: Left;   CYSTOSCOPY WITH RETROGRADE PYELOGRAM,  URETEROSCOPY AND STENT PLACEMENT Left 08/18/2019   Procedure: CYSTOSCOPY, URETEROSCOPY AND STENT EXCHANGE;  Surgeon: Franchot Gallo, MD;  Location: [facility];  Service: Urology;  Laterality: Left;  30 MINS   CYSTOSCOPY WITH RETROGRADE PYELOGRAM, URETEROSCOPY AND STENT PLACEMENT Left 11/17/2019   Procedure: CYSTOSCOPY WITH RETROGRADE PYELOGRAM, URETEROSCOPY AND STENT PLACEMENT;  Surgeon: Franchot Gallo, MD;  Location: [facility];  Service: Urology;  Laterality: Left;  26 MINS   CYSTOSCOPY WITH RETROGRADE PYELOGRAM, URETEROSCOPY AND STENT PLACEMENT Left 05/10/2020   Procedure: CYSTOSCOPY WITH RETROGRADE PYELOGRAM, URETEROSCOPY AND STENT PLACEMENT WITH URETHRAL DIALATION AND BRUSH BIOPSY;  Surgeon: Franchot Gallo, MD;  Location[facility];  Service: Urology;  Laterality: Left;  1 HR   CYSTOSCOPY WITH RETROGRADE PYELOGRAM, URETEROSCOPY AND STENT PLACEMENT Left 09/16/2020   Procedure: CYSTOSCOPY WITH RETROGRADE PYELOGRAM, URETEROSCOPY ,  LITHOPAXY, LEFT URETERAL BRUSHING, AND STENT REPLACEMENT;  Surgeon: Franchot Gallo, MD;  Location: [facility];  Service: Urology;  Laterality: Left;   CYSTOSCOPY WITH RETROGRADE PYELOGRAM, URETEROSCOPY AND STENT PLACEMENT Left 10/24/2021   Procedure: CYSTOSCOPY WITH RETROGRADE PYELOGRAM, URETEROSCOPY, POSSIBLE URETERAL AND RENAL BIOPSIES AND STENT PLACEMENT;  Surgeon: Franchot Gallo, MD;  Location: [facility];  Service: Urology;  Laterality: Left;  1 HR   CYSTOSCOPY WITH RETROGRADE PYELOGRAM, URETEROSCOPY AND STENT PLACEMENT Left 03/09/2022   Procedure: CYSTOSCOPY WITH ANTEGRADE PYELOGRAM,  STENT REMOVAL;  Surgeon: Franchot Gallo, MD;  Location: [facility];  Service: Urology;  Laterality: Left;   CYSTOSCOPY/URETEROSCOPY/HOLMIUM LASER/STENT PLACEMENT Left 05/02/2021   Procedure: CYSTOSCOPY/URETEROSCOPY WITH BRUSH BIOPSY/ RETROGRADE PYELOGRAM/ HOLMIUM LASER/STENT REPLACEMENT;  Surgeon: Franchot Gallo, MD;  Location: [facility];  Service: Urology;  Laterality: Left;   HOLMIUM LASER APPLICATION Left 97/0/2637   Procedure: HOLMIUM LASER APPLICATION;  Surgeon: Franchot Gallo, MD;  Location: [facility];  Service: Urology;  Laterality: Left;   HOLMIUM LASER APPLICATION Left 8/58/8502   Procedure: HOLMIUM LASER APPLICATION OF TUMORS;  Surgeon: Franchot Gallo, MD;  Location: [facility];  Service: Urology;  Laterality: Left;   IR NEPHROSTOMY PLACEMENT LEFT  07/06/2021   IR NEPHROSTOMY PLACEMENT LEFT  02/06/2022   kidney removed  2006   right    NEPHROSTOMY TUBE REMOVAL Left 03/23/2022   Procedure: NEPHROSTOMY TUBE REMOVAL;  Surgeon: Franchot Gallo, MD;  Location: [facility];  Service: Urology;  Laterality: Left;   RIGHT/LEFT HEART CATH AND CORONARY ANGIOGRAPHY N/A 11/06/2019   Procedure: RIGHT/LEFT HEART CATH AND CORONARY ANGIOGRAPHY;  Surgeon: Belva Crome, MD;  Location: [facility];  Service: Cardiovascular;  Laterality: N/A;   THULIUM LASER TURP (TRANSURETHRAL RESECTION OF PROSTATE) Left 08/18/2019   Procedure: THULIUM LASER ABLATION OF URETERAL TUMOR;  Surgeon: Franchot Gallo, MD;  Location: [facility];  Service: Urology;  Laterality: Left;   THULIUM LASER TURP (TRANSURETHRAL RESECTION OF PROSTATE) Left 11/17/2019   Procedure: THULIUM LASER of URETERAL CANCER;  Surgeon: Franchot Gallo, MD;  Location:  [facility];  Service: Urology;  Laterality: Left;    Allergies:  Allergies  Allergen Reactions   Erythromycin Diarrhea and Nausea And Vomiting   Lotensin [Benazepril Hcl] Cough   Nitrofurantoin Other (See Comments)    Medications:   Prior to Admission medications   Medication Sig Start Date End Date Taking? Authorizing Provider  acetaminophen (TYLENOL) 500 MG tablet Take 500-1,000 mg by mouth every 6 (six) hours as needed for mild pain.   Yes [provider]  apixaban Arne [place])  2.5 MG TABS tablet Take 1 tablet (2.5 mg total) by mouth 2 (two) times daily. 05/16/22  Yes Mercy Riding, MD  calcium carbonate (TUMS EX) 750 MG chewable tablet Chew 1 tablet by mouth every morning.   Yes [provider]  carvedilol (COREG) 6.25 MG tablet TAKE 1 TABLET(6.25 MG) BY MOUTH TWICE DAILY WITH A MEAL Patient taking differently: Take 6.25 mg by mouth 2 (two) times daily with a meal. 11/22/21  Yes Chandrasekhar, Mahesh A, MD  cholecalciferol (VITAMIN D3) 25 MCG (1000 UNIT) tablet Take 1,000 Units by mouth every morning.   Yes [provider]  ezetimibe (ZETIA) 10 MG tablet Take 1 tablet (10 mg total) by mouth daily. 12/21/21  Yes Chandrasekhar, Mahesh A, MD  Ferrous Sulfate (IRON PO) Take 1 tablet by mouth daily.   Yes [provider]  isosorbide mononitrate (IMDUR) 30 MG 24 hr tablet Take 0.5 tablets (15 mg total) by mouth daily. Patient taking differently: Take 15 mg by mouth every morning. 12/13/21  Yes Chandrasekhar, Mahesh A, MD  ondansetron (ZOFRAN) 8 MG tablet Take 8 mg by mouth 3 (three) times daily as needed for vomiting or nausea. 05/24/22  Yes [provider]  rosuvastatin (CRESTOR) 40 MG tablet Take 1 tablet (40 mg total) by mouth daily. 12/02/21  Yes Chandrasekhar, Mahesh A, MD  sertraline (ZOLOFT) 50 MG tablet Take 50 mg by mouth every morning.   Yes [provider]  traZODone (DESYREL) 150 MG tablet Take 150 mg by mouth at bedtime.  08/24/20  Yes [provider]  hydrALAZINE (APRESOLINE) 50 MG tablet TAKE 1 TABLET(50 MG) BY MOUTH EVERY 8 HOURS Patient not taking: Reported on 05/15/2022 02/13/22   Werner Lean, MD  HYDROcodone-acetaminophen (NORCO/VICODIN) 5-325 MG tablet Take 1 tablet by mouth every 6 (six) hours as needed for pain. Patient not taking: Reported on 05/15/2022 02/24/22   [provider]    Discontinued Meds:   Medications Discontinued During This Encounter  Medication Reason   albuterol (PROVENTIL) (2.5 MG/3ML) 0.083% nebulizer solution 2.5 mg    oxybutynin (DITROPAN) 5 MG tablet Patient Preference   vancomycin variable dose per unstable renal function (pharmacist dosing)    rosuvastatin (CRESTOR) tablet 40 mg    famotidine (PEPCID) IVPB 20 mg premix    famotidine (PEPCID) 10 mg in sodium chloride 0.9 % 25 mL    ceFEPIme (MAXIPIME) 2 g in sodium chloride 0.9 % 100 mL IVPB Change in therapy   0.9 %  sodium chloride infusion    heparin injection 5,000 Units     Social History:  reports that she quit smoking about 18 years ago. Her smoking use included cigarettes. She has a 19.00 pack-year smoking history. She has never used smokeless tobacco. She reports that she does not drink alcohol and does not use drugs.  Family History:   Family History  Problem Relation Age of Onset   Hypertension Mother    Diabetes Mellitus II Sister    Hypertension Sister    Hypertension Brother     Blood pressure (!) 162/82, pulse 79, temperature 98.8 F (37.1 C), temperature source Oral, resp. rate 18, height '5\' 3"'$  (1.6 m), weight (!) 178 kg, SpO2 96 %. General appearance: alert, cooperative, and appears stated age Head: Normocephalic, without obvious abnormality, atraumatic Eyes: negative Neck: no adenopathy, no carotid bruit, no JVD, supple, symmetrical, trachea midline, and thyroid not enlarged, symmetric, no tenderness/mass/nodules Back: symmetric, no curvature. ROM normal. No CVA  tenderness. Resp: rales bibasilar  and bilaterally Cardio: regular rate and rhythm GI: soft, non-tender; bowel sounds normal; no masses,  no organomegaly Extremities: edema tr Pulses: 2+ and symmetric Skin: Skin color, texture, turgor normal. No rashes or lesions       Clarise Chacko, Hunt Oris, MD 06/05/2022, 3:23 PM

## 2022-06-05 NOTE — Care Management Important Message (Signed)
Important Message  Patient Details IM Letter given to the Patient. Name: [name] MRN: [id number] Date of Birth: August 13, 1946   Medicare Important Message Given:  Yes     Kerin Salen 06/05/2022, 10:28 AM

## 2022-06-05 NOTE — Plan of Care (Signed)

## 2022-06-06 DIAGNOSIS — N179 Acute kidney failure, unspecified: Secondary | ICD-10-CM | POA: Diagnosis not present

## 2022-06-06 LAB — CBC WITH DIFFERENTIAL/PLATELET
Abs Immature Granulocytes: 0.63 10*3/uL — ABNORMAL HIGH (ref 0.00–0.07)
Basophils Absolute: 0.1 10*3/uL (ref 0.0–0.1)
Basophils Relative: 1 %
Eosinophils Absolute: 0.3 10*3/uL (ref 0.0–0.5)
Eosinophils Relative: 2 %
HCT: 27.5 % — ABNORMAL LOW (ref 36.0–46.0)
Hemoglobin: 8.4 g/dL — ABNORMAL LOW (ref 12.0–15.0)
Immature Granulocytes: 5 %
Lymphocytes Relative: 15 %
Lymphs Abs: 1.9 10*3/uL (ref 0.7–4.0)
MCH: 29.4 pg (ref 26.0–34.0)
MCHC: 30.5 g/dL (ref 30.0–36.0)
MCV: 96.2 fL (ref 80.0–100.0)
Monocytes Absolute: 1.2 10*3/uL — ABNORMAL HIGH (ref 0.1–1.0)
Monocytes Relative: 9 %
Neutro Abs: 9.2 10*3/uL — ABNORMAL HIGH (ref 1.7–7.7)
Neutrophils Relative %: 68 %
Platelets: 288 10*3/uL (ref 150–400)
RBC: 2.86 MIL/uL — ABNORMAL LOW (ref 3.87–5.11)
RDW: 18.2 % — ABNORMAL HIGH (ref 11.5–15.5)
WBC: 13.2 10*3/uL — ABNORMAL HIGH (ref 4.0–10.5)
nRBC: 0 % (ref 0.0–0.2)

## 2022-06-06 LAB — BASIC METABOLIC PANEL
Anion gap: 4 — ABNORMAL LOW (ref 5–15)
BUN: 36 mg/dL — ABNORMAL HIGH (ref 8–23)
CO2: 20 mmol/L — ABNORMAL LOW (ref 22–32)
Calcium: 8.6 mg/dL — ABNORMAL LOW (ref 8.9–10.3)
Chloride: 118 mmol/L — ABNORMAL HIGH (ref 98–111)
Creatinine, Ser: 3.54 mg/dL — ABNORMAL HIGH (ref 0.44–1.00)
GFR, Estimated: 13 mL/min — ABNORMAL LOW (ref >60)
Glucose, Bld: 94 mg/dL (ref 70–99)
Potassium: 5.5 mmol/L — ABNORMAL HIGH (ref 3.5–5.1)
Sodium: 142 mmol/L (ref 135–145)

## 2022-06-06 LAB — CULTURE, BLOOD (ROUTINE X 2)
Culture: NO GROWTH
Culture: NO GROWTH
Special Requests: ADEQUATE
Special Requests: ADEQUATE

## 2022-06-06 LAB — C3 COMPLEMENT: C3 Complement: 142 mg/dL (ref 82–167)

## 2022-06-06 LAB — C4 COMPLEMENT: Complement C4, Body Fluid: 21 mg/dL (ref 12–38)

## 2022-06-06 MED ORDER — FUROSEMIDE 10 MG/ML IJ SOLN
80.0000 mg | Freq: Once | INTRAMUSCULAR | Status: AC
  Administered 2022-06-06: 80 mg via INTRAVENOUS
  Filled 2022-06-06: qty 8

## 2022-06-06 MED ORDER — CARVEDILOL 3.125 MG PO TABS
3.1250 mg | ORAL_TABLET | Freq: Two times a day (BID) | ORAL | Status: DC
  Administered 2022-06-06 – 2022-06-11 (×11): 3.125 mg via ORAL
  Filled 2022-06-06 (×11): qty 1

## 2022-06-06 MED ORDER — SODIUM ZIRCONIUM CYCLOSILICATE 10 G PO PACK
10.0000 g | PACK | Freq: Once | ORAL | Status: AC
  Administered 2022-06-06: 10 g via ORAL
  Filled 2022-06-06: qty 1

## 2022-06-06 NOTE — Progress Notes (Signed)
Mobility Specialist - Progress Note   06/06/22 1300  Mobility  Activity Ambulated with assistance in hallway  Level of Assistance Standby assist, set-up cues, supervision of patient - no hands on  Assistive Device Front wheel walker  Distance Ambulated (ft) 500 ft  Activity Response Tolerated well  $Mobility charge 1 Mobility   Pt received in bed and agreed to ambulate. No c/o pain nor discomfort during ambulation. Pt left in bed with all needs met and call bell within reach with RN notified.   Roderick Pee Mobility Specialist

## 2022-06-06 NOTE — Progress Notes (Signed)
PROGRESS NOTE  [name]  DOB: [birth year]  PCP: Heather Smoker, MD PFX:[id number]  DOA: 05/30/2022  LOS: 7 days  Hospital Day: 8  Brief narrative: [name] is a 76 y.o. female with PMH significant for urothelial cancer, HTN, HLD, CAD, CHF, CKD, PE in July 2023, GERD, anxiety depression. Patient has history of urothelial cancer.  Several years ago, she had high-grade urothelial carcinoma of the right distal ureter and underwent right nephro ureterectomy.  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  It was then treated with laser ablation.  In 2022, she was found to have a small volume recurrence in the left calyceal system.  It was treated with focal ablation but also with injection of Jelmyto in the left collecting system through her nephrostomy tube.  She was started on her second round of chemotherapy which was supposed to go for 6 weeks but she was not able to continue that for more than 3 weeks because of significant pain and left ureteral obstruction. On 03/23/2022, she underwent removal of nephrostomy tube and placement of DJ stent on the left.  Subsequently she had UTI as an outpatient twice treated with antibiotics. 7/14 to 7/19, patient was hospitalized with fever, nausea, vomiting, generalized weakness.  She was noted to have sepsis secondary to left-sided pyelonephritis.  Blood culture grew staph hominis and capitis as well as Staph epidermidis.  Per ID recommendation, she was treated with IV vancomycin and was discharged on 9 days of oral Zyvox which she completed post discharge.  Ureteral stent was left in place.  On that hospitalization, patient was also found to have a small right lower lobe pulm embolism and was started on Eliquis following with long-term aspirin.  Patient states that since starting Eliquis, she started to get hematuria. Patient was recently hospitalized 8/7 to 8/8  for low hemoglobin, FOBT positive and urinalysis with RBCs.  She underwent 2 units of blood transfusion.  [facility] and urologist Dr. Gloriann [name] were consulted.  Hemoglobin stabilized and she was discharged without intervention.  Patient was advised to stop baby aspirin and any other NSAIDs. In the interval, she saw her PCP and was resumed again on Eliquis at half dose of 2.5 mg twice daily and a baby aspirin.  She has started to notice hematuria again. She was in her usual state of health until last night.  Per husband, last night she started to feel warm and had chills.  Around 3 AM this morning, she started having nausea and vomited twice.  She started to get weak and hence EMS was called. EMS noted lobe pressure and give normal saline bolus in route to the ED.  In the ED, patient was afebrile, heart rate in 80s, had a low blood pressure in 80s, breathing on room air Labs with BUN/creatinine elevated to 32/3.17, troponin elevated to 230, lactic acid elevated to 3.7 WBC count elevated to 21.6, hemoglobin low at 9.1, glucose elevated to 120 Urinalysis with cloudy yellow urine with moderate amount of hemoglobin, large amount of leukocytes, positive nitrite, many bacteria EKG in normal sinus rhythm with QTc 459 ms Admitted to hospitalist service Urine cultures sent. Blood culture is showing preliminary growth of gram-positive cocci in anaerobic bottle only. See below for details  Subjective: Patient was seen and examined this morning. Lying on bed.  Not in distress.  No new symptoms.  No fever.  No family at bedside. Labs morning show improving WBC count, creatinine seems to be plateauing.  Assessment/Plan: Sepsis -POA UTI in the presence of left DJ stent Presented with chills, nausea, vomiting, leukocytosis, lactic acidosis and AKI Recent hospitalization for left pyelonephritis, coagulase negative staph bacteremia. Urinalysis on admission positive for leukocytes and nitrite and many bacteria.    Initially started on broad-spectrum IV antibiotics Urine culture grew 30,000 CFU per mL of Citrobacter.  ID consult was obtained.  Switched to ciprofloxacin oral.  In last 24 hours, WBC count improved in last 24 hours. Recent Labs  Lab 06/02/22 0524 06/03/22 0606 06/04/22 0820 06/05/22 0501 06/06/22 0619  WBC 13.9* 15.1* 14.7* 16.0* 13.2*    Staph hominis in blood 1 out of 4 blood culture report grew Staphylococcus hominis.  Patient had recent bacteremia with a Staph hominis in July as well.  ID consult was obtained.  1/4 positive blood culture was regarded as a contaminant.  Currently not on IV vancomycin.   AKI on CKD 3B Creatinine at baseline close to 1.5.  Patient presented with creatinine elevated to 3.17.  It initially improved with IV fluid but started worsening again despite more than 9 L hydration.  Nephrology was consulted.  Seems to be plateauing close to 3.5. Recent Labs    05/15/22 1617 05/16/22 0026 05/30/22 1000 05/31/22 0626 06/01/22 0601 06/02/22 0524 06/03/22 0606 06/04/22 0820 06/05/22 0500 06/06/22 0619  BUN 12 12 32* 35* 37* 34* 38* 34* 38* 36*  CREATININE 1.44* 1.43* 3.17* 2.57* 2.83* 2.91* 3.09* 3.11* 3.58* 3.54*   Hyperkalemia Potassium elevated to 5.5 this morning. Probably related to renal failure.  Not on regular potassium supplement.  Lokelma ordered Recent Labs  Lab 05/31/22 0626 06/01/22 0601 06/02/22 0524 06/03/22 0606 06/04/22 0820 06/05/22 0500 06/06/22 0619  K 4.6   < > 4.5 5.2* 5.2* 5.1 5.5*  MG 1.8  --   --   --   --   --   --   PHOS 4.6  --   --   --   --   --   --    < > = values in this interval not displayed.   Acute on chronic anemia It seems that patient's hemoglobin was more than 12 till May 2023.  It has been gradually trending down since then.  In last hospitalization, hemoglobin was low at 7.1, received 2 units transfusion and was discharged with a hemoglobin of 11.  Patient presented with hemoglobin at 9 this time.  No  active bleeding but hemoglobin is gradually falling down.  FOBT negative.  Hemoglobin was low at 6.9 yesterday 8/27.  1 unit of PRBC transfusion was given.  Hemoglobin improved, currently stable over 8. Recent Labs    05/16/22 0026 05/16/22 1339 06/02/22 0524 06/03/22 0606 06/04/22 0820 06/05/22 0501 06/06/22 0619  HGB 7.1*   < > 7.5* 7.4* 6.9* 8.5*  8.5* 8.4*  MCV 97.5   < > 95.8 98.4 97.9 97.9 96.2  TIBC 259  --   --   --   --   --   --   IRON 38  --   --   --   --   --   --    < > = values in this interval not displayed.   Elevated troponin Likely demand ischemia due to sepsis.  No real chest pain.  HypOtension Blood pressure low in 80s at presentation.  Improving gradually with IV fluid.  Continue to monitor on  IV fluid Keep BP meds on hold  History of CHF PTA on Coreg 6.25 mg twice daily, Imdur 15 mg daily Currently on Coreg.  Imdur remains on hold.  Blood pressure trending up.  Noted that nephrology decided to give Lasix today.  If blood pressure remains elevated tomorrow, can resume Imdur. Echo with EF 60 to 65%, no wall motion abnormality, grade 1 diastolic dysfunction, no significant change since May 2022.  CAD/HLD PTA on Coreg, Imdur, Crestor 40 mg daily, Zetia 10 mg daily Continue Crestor and Zetia.  Small PE in July 2023 04/22/2022, CTPA showed solitary small nonocclusive thrombus in the right lower lobe pulmonary artery at the segmental branching level.  At that time, she was started on Eliquis which she could not continue prolonged because of hematuria.  Per patient, she has been started back on aspirin and Eliquis by her PCP.  It seems hematuria started again at home.  Currently both Eliquis and aspirin on hold at this time.  No longer having hematuria at this time.  But because of persistently low hemoglobin level, decision was made with family to avoid anticoagulation.  GERD PPI  anxiety depression PTA on trazodone, Zoloft  Urothelial cancer S/p right  distal ureter and underwent right nephro ureterectomy several years ago Recurrence in left ureter in 2020 s/p focal ablation Recurrence in left calyceal system in 2022.  Was getting chemotherapy to nephrostomy tube under the care of Dr. Diona Fanti.  It was stopped because of intolerance and left ureteral obstruction S/p left ureter DJ stent 03/23/2022 Continue Ditropan Discussed with Dr. Diona Fanti.  KUB was obtained which showed the stent in position.  No other abnormality seen.  Mobility PT eval obtained.  Independent.  Goals of care -  Code Status: Full Code    Diet:  Diet Order             Diet Heart Room service appropriate? Yes; Fluid consistency: Thin  Diet effective now                  DVT prophylaxis:  Place and maintain sequential compression device Start: 06/04/22 1358   Antimicrobials: Ciprofloxacin 250 mg daily. Fluid: Not on IV fluid. Consultants: None at this time Family Communication: Family not at bedside.  Called and updated her husband on the phone  Status is: Inpatient  Continue in-hospital care because: Renal function seems to be plateauing Level of care: Telemetry   Dispo: The patient is from: Home              Anticipated d/c is to: Pending clinical course.  No PT follow-up required per eval.  Hopefully home in 1 to 2 days.              Patient currently is not medically stable to d/c.   Difficult to place patient No     Infusions:     Scheduled Meds:  calcium carbonate  1 tablet Oral TID WC   carvedilol  3.125 mg Oral BID WC   ciprofloxacin  250 mg Oral Daily   ezetimibe  10 mg Oral Daily   famotidine  10 mg Oral QODAY   feeding supplement  1 Container Oral TID BM   feeding supplement  237 mL Oral BID BM   rosuvastatin  10 mg Oral q morning   sertraline  50 mg Oral q morning   traZODone  150 mg Oral QHS    PRN meds: acetaminophen **OR** acetaminophen, albuterol, hydrALAZINE, lip balm, ondansetron (ZOFRAN) IV,  oxybutynin,  polyethylene glycol   Antimicrobials: Anti-infectives (From admission, onward)    Start     Dose/Rate Route Frequency Ordered Stop   06/02/22 1100  ciprofloxacin (CIPRO) tablet 250 mg        250 mg Oral Daily 06/02/22 0943 06/08/22 0959   05/31/22 1100  ceFEPIme (MAXIPIME) 2 g in sodium chloride 0.9 % 100 mL IVPB  Status:  Discontinued        2 g 200 mL/hr over 30 Minutes Intravenous Every 24 hours 05/30/22 1518 06/02/22 0943   05/30/22 1517  vancomycin variable dose per unstable renal function (pharmacist dosing)  Status:  Discontinued         Does not apply See admin instructions 05/30/22 1518 05/31/22 1214   05/30/22 1030  ceFEPIme (MAXIPIME) 2 g in sodium chloride 0.9 % 100 mL IVPB        2 g 200 mL/hr over 30 Minutes Intravenous  Once 05/30/22 1024 05/30/22 1125   05/30/22 1030  vancomycin (VANCOREADY) IVPB 1500 mg/300 mL        1,500 mg 150 mL/hr over 120 Minutes Intravenous  Once 05/30/22 1024 05/30/22 1242       Objective: Vitals:   06/06/22 0448 06/06/22 1343  BP: (!) 156/72 (!) 158/73  Pulse: 86 78  Resp: 17 17  Temp: 98.3 F (36.8 C) 98 F (36.7 C)  SpO2: 95% 98%    Intake/Output Summary (Last 24 hours) at 06/06/2022 1534 Last data filed at 06/06/2022 1445 Gross per 24 hour  Intake 720 ml  Output 1500 ml  Net -780 ml   Filed Weights   05/30/22 1427 06/04/22 1739 06/06/22 0448  Weight: 74.4 kg (!) 178 kg 82.3 kg   Weight change: -95.8 kg Body mass index is 32.13 kg/m.   Physical Exam: General exam: Pleasant, elderly Caucasian female.  Not in physical distress Skin: No rashes, lesions or ulcers. HEENT: Atraumatic, normocephalic, no obvious bleeding Lungs: Clear to auscultation bilaterally CVS: Regular rate and rhythm, no murmur GI/Abd soft, no abdominal tenderness, bowel sound present CNS: Alert, awake, oriented x3 Psychiatry: Mood appropriate Extremities: No pedal edema, no calf tenderness  Data Review: I have personally reviewed the laboratory  data and studies available.  F/u labs ordered Unresulted Labs (From admission, onward)     Start     Ordered   06/07/22 4481  Basic metabolic panel  Tomorrow morning,   R       Question:  Specimen collection method  Answer:  Lab=Lab collect   06/06/22 1534   06/07/22 0500  CBC with Differential/Platelet  Tomorrow morning,   R       Question:  Specimen collection method  Answer:  Lab=Lab collect   06/06/22 1534            Signed, Terrilee Croak, MD [facility] 06/06/2022

## 2022-06-06 NOTE — Progress Notes (Signed)
[facility] Progress Note   76 y.o. female HTN, HLD, CASHD, dCHF, PE 04/2022, right nephro ureterectomy 2006 for high grade urothelial carcinoma, left sided urothelial cancer (renal pelvis and ureter) discovered 05/2019 treated with laser ablation. Then recurrence on left side in 2022 ltx initially with Jelmyto d/c bec of obstruction. S/p  double J stent + nephrostogram through left nephrostomy tube + removal of nephrostomy tube on 03/23/2022. Patient recently admitted  7/14-7/19 with Staph hominis bacteremia tx with Vancomycin transitioning to linezolid. Patient also with cictrobacter freundii + urine cultures. Patient was also on Eliquis for a PE discovered during that hospitalization. Patient now presenting with nausea, vomiting, anorexia, fatigue found to be afebrile in the ED but SBP was in the 80's. BUN/Cr was also 35/2.57  with a WBC of 21.6. Cr was in the 1.3-1.5 range in 04/21/22 - 05/16/22. Ultrasound showed normal cortical thickness, slightly increased cortical echogenicity, mild LEFT hydronephrosis.  Assessment/ Plan:   Acute kidney injury on CKD 3 w/ a baseline cr 1.3-1.5. Unclear etiology of AKI as by history and PE the patient does not appear +10.5 L. May have ATN from the UTI vs obstructive uropathy. I would think it's too early for AIN and her renal function was already worsening immediately upon admission. - Strict I&O's, daily weights preferably on a floor scale - Renal ultrasound only shows mild left sided hydro but this is in the context of a solitary kidney. CXR does not show congestion. - Did not see pigmented casts but some normomorphic RBC, florid WBC's c/w UTI.  - UOP is good and may cr may be plateauing; will monitor trend. AKI likely associated with the UTI or it may be mild ATN.  - Will also give a dose of Lasix - Lokelma x2.  -Maintain MAP>65 for optimal renal perfusion; actually hypertensive at this point.  - Avoid nephrotoxic agents such as IV contrast,  NSAIDs, and phosphate containing bowel preps (FLEETS)   UTI growing Citrobacter currently on ciprofloxacin. Could the stent be the source of the infection? J stent is still present. Aneimia - unable to give IV iron w active infection. Transfuse if symptomatic or if Hb drops below 7.  Initially hypotenstive but now hypertensive being appropriately treated. H/o PE - but Eliquis and ASA on hold for hematuria Urothelial cancer  Subjective:   Feeling better, no nausea but appetite still poor.   Objective:   BP (!) 158/73 (BP Location: Right Arm)   Pulse 78   Temp 98 F (36.7 C) (Oral)   Resp 17   Ht '5\' 3"'$  (1.6 m)   Wt 82.3 kg   SpO2 98%   BMI 32.13 kg/m   Intake/Output Summary (Last 24 hours) at 06/06/2022 1355 Last data filed at 06/06/2022 0900 Gross per 24 hour  Intake 240 ml  Output 1100 ml  Net -860 ml   Weight change: -95.8 kg  Physical Exam: General appearance: alert, cooperative Head: NCAT Neck: no adenopathy Back: symmetric, no curvature. No CVA tenderness. Resp: rales bibasilar and bilaterally Cardio: regular rate and rhythm GI: soft, non-tender; bowel sounds normal Extremities: edema tr Pulses: 2+ and symmetric Skin: No rashes or lesions  Imaging: DG CHEST PORT 1 VIEW  Result Date: 06/05/2022 CLINICAL DATA:  Acute renal failure.  Congestive heart failure. EXAM: PORTABLE CHEST 1 VIEW COMPARISON:  AP chest 05/30/2022 FINDINGS: Mildly decreased lung volumes. Cardiac silhouette is mildly enlarged. Mediastinal contours are within normal limits. Mild bibasilar linear subsegmental atelectasis. No high-grade focal airspace opacity.  No definite pleural effusion. No pneumothorax. Mild multilevel degenerative disc changes of the thoracic spine. IMPRESSION: Mild multilevel degenerative disc changes of the thoracic spine. Electronically Signed   By: Yvonne Kendall M.D.   On: 06/05/2022 17:16   US RENAL  Result Date: 06/05/2022 CLINICAL DATA:  Acute renal failure EXAM: RENAL /  URINARY TRACT ULTRASOUND COMPLETE COMPARISON:  None Available. FINDINGS: Right Kidney: Surgically absent. Left Kidney: Renal measurements: 12.8 x 7.2 x 6.6 cm = volume: 315.1 mL. Echogenicity within normal limits. No mass visualized. Mild hydronephrosis. Bladder: Appears normal for degree of bladder distention. Left ureter jet not visualized. Other: Left pleural effusion. IMPRESSION: 1. Mild left hydronephrosis, ureter jet not visualized which is suggestive of obstruction. 2. Surgically absent right kidney. Electronically Signed   By: Yetta Glassman M.D.   On: 06/05/2022 17:10    Labs: BMET Recent Labs  Lab 05/31/22 7353 06/01/22 0601 06/02/22 0524 06/03/22 0606 06/04/22 0820 06/05/22 0500 06/06/22 0619  NA 142 137 142 144 138 139 142  K 4.6 4.2 4.5 5.2* 5.2* 5.1 5.5*  CL 114* 112* 118* 119* 117* 116* 118*  CO2 20* 18* 20* 20* 17* 18* 20*  GLUCOSE 92 102* 90 95 88 90 94  BUN 35* 37* 34* 38* 34* 38* 36*  CREATININE 2.57* 2.83* 2.91* 3.09* 3.11* 3.58* 3.54*  CALCIUM 7.8* 7.6* 8.1* 8.3* 7.8* 8.0* 8.6*  PHOS 4.6  --   --   --   --   --   --    CBC Recent Labs  Lab 06/03/22 0606 06/04/22 0820 06/05/22 0501 06/06/22 0619  WBC 15.1* 14.7* 16.0* 13.2*  NEUTROABS 11.0*  --   --  9.2*  HGB 7.4* 6.9* 8.5*  8.5* 8.4*  HCT 25.0* 23.4* 27.8*  27.9* 27.5*  MCV 98.4 97.9 97.9 96.2  PLT 309 286 302 288    Medications:     calcium carbonate  1 tablet Oral TID WC   carvedilol  3.125 mg Oral BID WC   ciprofloxacin  250 mg Oral Daily   ezetimibe  10 mg Oral Daily   famotidine  10 mg Oral QODAY   feeding supplement  1 Container Oral TID BM   feeding supplement  237 mL Oral BID BM   rosuvastatin  10 mg Oral q morning   sertraline  50 mg Oral q morning   traZODone  150 mg Oral QHS      Otelia Santee, MD 06/06/2022, 1:55 PM

## 2022-06-07 DIAGNOSIS — C689 Malignant neoplasm of urinary organ, unspecified: Secondary | ICD-10-CM

## 2022-06-07 DIAGNOSIS — N179 Acute kidney failure, unspecified: Secondary | ICD-10-CM | POA: Diagnosis not present

## 2022-06-07 DIAGNOSIS — D649 Anemia, unspecified: Secondary | ICD-10-CM

## 2022-06-07 LAB — CBC WITH DIFFERENTIAL/PLATELET
Abs Immature Granulocytes: 0.33 10*3/uL — ABNORMAL HIGH (ref 0.00–0.07)
Basophils Absolute: 0.1 10*3/uL (ref 0.0–0.1)
Basophils Relative: 0 %
Eosinophils Absolute: 0.2 10*3/uL (ref 0.0–0.5)
Eosinophils Relative: 2 %
HCT: 27.2 % — ABNORMAL LOW (ref 36.0–46.0)
Hemoglobin: 8.4 g/dL — ABNORMAL LOW (ref 12.0–15.0)
Immature Granulocytes: 3 %
Lymphocytes Relative: 12 %
Lymphs Abs: 1.6 10*3/uL (ref 0.7–4.0)
MCH: 29.7 pg (ref 26.0–34.0)
MCHC: 30.9 g/dL (ref 30.0–36.0)
MCV: 96.1 fL (ref 80.0–100.0)
Monocytes Absolute: 1.1 10*3/uL — ABNORMAL HIGH (ref 0.1–1.0)
Monocytes Relative: 8 %
Neutro Abs: 9.3 10*3/uL — ABNORMAL HIGH (ref 1.7–7.7)
Neutrophils Relative %: 75 %
Platelets: 285 10*3/uL (ref 150–400)
RBC: 2.83 MIL/uL — ABNORMAL LOW (ref 3.87–5.11)
RDW: 18.1 % — ABNORMAL HIGH (ref 11.5–15.5)
WBC: 12.6 10*3/uL — ABNORMAL HIGH (ref 4.0–10.5)
nRBC: 0.2 % (ref 0.0–0.2)

## 2022-06-07 LAB — BASIC METABOLIC PANEL
Anion gap: 6 (ref 5–15)
BUN: 36 mg/dL — ABNORMAL HIGH (ref 8–23)
CO2: 19 mmol/L — ABNORMAL LOW (ref 22–32)
Calcium: 8.5 mg/dL — ABNORMAL LOW (ref 8.9–10.3)
Chloride: 112 mmol/L — ABNORMAL HIGH (ref 98–111)
Creatinine, Ser: 3.64 mg/dL — ABNORMAL HIGH (ref 0.44–1.00)
GFR, Estimated: 13 mL/min — ABNORMAL LOW (ref >60)
Glucose, Bld: 92 mg/dL (ref 70–99)
Potassium: 4.6 mmol/L (ref 3.5–5.1)
Sodium: 137 mmol/L (ref 135–145)

## 2022-06-07 LAB — VANCOMYCIN, TROUGH: Vancomycin Tr: 5 ug/mL — ABNORMAL LOW (ref 15–20)

## 2022-06-07 NOTE — Progress Notes (Signed)
Mobility Specialist - Progress Note   06/07/22 1300  Mobility  Activity Ambulated with assistance in hallway  Level of Assistance Standby assist, set-up cues, supervision of patient - no hands on  Assistive Device Front wheel walker  Distance Ambulated (ft) 500 ft  Activity Response Tolerated well  $Mobility charge 1 Mobility   Pt received in bed and agreed for mobility. No c/o pain nor discomfort during ambulation. Pt left in bed with all needs met and call bell within reach.   Roderick Pee Mobility Specialist

## 2022-06-07 NOTE — Progress Notes (Signed)
[name] Progress Note   76 y.o. female HTN, HLD, CASHD, dCHF, PE 04/2022, right nephro ureterectomy 2006 for high grade urothelial carcinoma, left sided urothelial cancer (renal pelvis and ureter) discovered 05/2019 treated with laser ablation. Then recurrence on left side in 2022 ltx initially with Jelmyto d/c bec of obstruction. S/p  double J stent + nephrostogram through left nephrostomy tube + removal of nephrostomy tube on 03/23/2022. Patient recently admitted  7/14-7/19 with Staph hominis bacteremia tx with Vancomycin transitioning to linezolid. Patient also with cictrobacter freundii + urine cultures. Patient was also on Eliquis for a PE discovered during that hospitalization. Patient now presenting with nausea, vomiting, anorexia, fatigue found to be afebrile in the ED but SBP was in the 80's. BUN/Cr was also 35/2.57  with a WBC of 21.6. Cr was in the 1.3-1.5 range in 04/21/22 - 05/16/22. Ultrasound showed normal cortical thickness, slightly increased cortical echogenicity, mild LEFT hydronephrosis.  Assessment/ Plan:   Acute kidney injury on CKD 3 w/ a baseline cr 1.3-1.5. Unclear etiology of AKI as by history and PE the patient does not appear +10.5 L. May have ATN from the UTI vs obstructive uropathy. I would think it's too early for AIN and her renal function was already worsening immediately upon admission. - Strict I&O's, daily weights preferably on a floor scale - Renal ultrasound only shows mild left sided hydro but this is in the context of a solitary kidney. CXR does not show congestion. - Did not see pigmented casts but some normomorphic RBC, florid WBC's c/w UTI.  - UOP is good and had been hoping the cr was plateauing but not quite yet; will monitor trend. AKI likely associated with the UTI or it may be mild ATN.  - Will also check a Vancomycin trough level (vanc can cause microspherules which are toxic to renal tubules)  -Maintain MAP>65 for optimal renal perfusion;  actually hypertensive at this point.  - Avoid nephrotoxic agents such as IV contrast, NSAIDs, and phosphate containing bowel preps (FLEETS)   UTI growing Citrobacter currently on ciprofloxacin. Could the stent be the source of the infection? J stent is still present. Aneimia - unable to give IV iron w active infection. Transfuse if symptomatic or if Hb drops below 7.  Initially hypotenstive but now hypertensive being appropriately treated. H/o PE - but Eliquis and ASA on hold for hematuria Urothelial cancer  Subjective:   Feeling better, no nausea but appetite still poor.   Objective:   BP (!) 162/62 (BP Location: Right Arm)   Pulse 83   Temp 98.7 F (37.1 C)   Resp 19   Ht '5\' 3"'$  (1.6 m)   Wt 80.5 kg   SpO2 94%   BMI 31.44 kg/m   Intake/Output Summary (Last 24 hours) at 06/07/2022 1302 Last data filed at 06/06/2022 1700 Gross per 24 hour  Intake 240 ml  Output 450 ml  Net -210 ml   Weight change: -1.769 kg  Physical Exam: General appearance: alert, cooperative Head: NCAT Neck: no adenopathy Back: symmetric, no curvature. No CVA tenderness. Resp: rales bibasilar and bilaterally Cardio: regular rate and rhythm GI: soft, non-tender; bowel sounds normal Extremities: edema tr Pulses: 2+ and symmetric Skin: No rashes or lesions  Imaging: DG CHEST PORT 1 VIEW  Result Date: 06/05/2022 CLINICAL DATA:  Acute renal failure.  Congestive heart failure. EXAM: PORTABLE CHEST 1 VIEW COMPARISON:  AP chest 05/30/2022 FINDINGS: Mildly decreased lung volumes. Cardiac silhouette is mildly enlarged. Mediastinal contours are within  normal limits. Mild bibasilar linear subsegmental atelectasis. No high-grade focal airspace opacity. No definite pleural effusion. No pneumothorax. Mild multilevel degenerative disc changes of the thoracic spine. IMPRESSION: Mild multilevel degenerative disc changes of the thoracic spine. Electronically Signed   By: Yvonne Kendall M.D.   On: 06/05/2022 17:16   US  RENAL  Result Date: 06/05/2022 CLINICAL DATA:  Acute renal failure EXAM: RENAL / URINARY TRACT ULTRASOUND COMPLETE COMPARISON:  None Available. FINDINGS: Right Kidney: Surgically absent. Left Kidney: Renal measurements: 12.8 x 7.2 x 6.6 cm = volume: 315.1 mL. Echogenicity within normal limits. No mass visualized. Mild hydronephrosis. Bladder: Appears normal for degree of bladder distention. Left ureter jet not visualized. Other: Left pleural effusion. IMPRESSION: 1. Mild left hydronephrosis, ureter jet not visualized which is suggestive of obstruction. 2. Surgically absent right kidney. Electronically Signed   By: Yetta Glassman M.D.   On: 06/05/2022 17:10    Labs: BMET Recent Labs  Lab 06/01/22 0601 06/02/22 0524 06/03/22 0606 06/04/22 0820 06/05/22 0500 06/06/22 0619 06/07/22 0539  NA 137 142 144 138 139 142 137  K 4.2 4.5 5.2* 5.2* 5.1 5.5* 4.6  CL 112* 118* 119* 117* 116* 118* 112*  CO2 18* 20* 20* 17* 18* 20* 19*  GLUCOSE 102* 90 95 88 90 94 92  BUN 37* 34* 38* 34* 38* 36* 36*  CREATININE 2.83* 2.91* 3.09* 3.11* 3.58* 3.54* 3.64*  CALCIUM 7.6* 8.1* 8.3* 7.8* 8.0* 8.6* 8.5*   CBC Recent Labs  Lab 06/03/22 0606 06/04/22 0820 06/05/22 0501 06/06/22 0619 06/07/22 0539  WBC 15.1* 14.7* 16.0* 13.2* 12.6*  NEUTROABS 11.0*  --   --  9.2* 9.3*  HGB 7.4* 6.9* 8.5*  8.5* 8.4* 8.4*  HCT 25.0* 23.4* 27.8*  27.9* 27.5* 27.2*  MCV 98.4 97.9 97.9 96.2 96.1  PLT 309 286 302 288 285    Medications:     calcium carbonate  1 tablet Oral TID WC   carvedilol  3.125 mg Oral BID WC   ezetimibe  10 mg Oral Daily   famotidine  10 mg Oral QODAY   feeding supplement  1 Container Oral TID BM   feeding supplement  237 mL Oral BID BM   rosuvastatin  10 mg Oral q morning   sertraline  50 mg Oral q morning   traZODone  150 mg Oral QHS      Otelia Santee, MD 06/07/2022, 1:02 PM

## 2022-06-07 NOTE — Consult Note (Signed)
Urology Consult   Physician requesting consult: Dr. Pricilla Handler  Reason for consult: History of urothelial carcinoma of the left ureter and renal pelvis, recent urinary tract infections, elevated creatinine  History of Present Illness: [name] is a 76 y.o. female with long history of urothelial carcinoma.  Underwent right nephro ureterectomy in 2008 for high-grade noninvasive urothelial carcinoma.  She was followed regularly since that intervention, and until August/September 2020 had no evidence of recurrence.  At that time she was found to have a recurrence in her left distal ureter upon presentation for CHF and left hydronephrosis.  She underwent urgent stent placement.  Initially treatment was local with ureteroscopic management of lower ureteral tumors, low volume, and stent placement.  She was found to have recurrence, very small volume in her upper calyceal system in July 2022.  Following ureteroscopy, she had a nephrostomy tube placed and underwent [place] in her upper calyceal system.  She completed 6 instillations in late November.  She tolerated these fairly well.  At that time she did not have a stent placed and had adequate drainage through her left ureter.  In May of this year she had nephrostomy tube placed again, and received just 2 [place] through this nephrostomy tube.  She did not tolerate these well.  Nephrostomy tube was subsequently removed, but despite having a normal nephrostogram with transit into the bladder in May, she developed obstruction.  She underwent urgent stent placement on June 15.  That was her last stent placement.  She has had recurring infections, most recently Citrobacter.  She has been in the hospital now 8 days.  She does have an increase in her creatinine from 1.0 in my office in July to his level now of 3.64.  Renal ultrasound on the 24th and the 28th revealed mild hydronephrosis (not unexpected with a stent in place).  There  is a question of whether the stent is obstructed.  Most recent ultrasound showed no ureteral jet at the ureterovesical junction.  The patient is having no flank pain.  She is having no stent discomfort.  There is no dysuria or gross hematuria.     Past Medical History:  Diagnosis Date   Anxiety    Bilateral swelling of feet 03/11/2022   bilateral feet edema much improved per pt on 03-20-2022, bilateral foot edema resolves after propping both feet up   CAD in native artery    a. cath 10/2019- mild to moderate dx >> medical therapy    Cancer [facility])    left ureteral cancer dx sept 2020   CHF (congestive heart failure) ([place])    Chronic kidney disease 06/2019   acute renal insufficiency sees dr Dorina Hoyer, ckd stage 3 per pt   COVID 09/2020   tired sore throat nausea hair fell out loss of taste and smell x 10 days, all symptoms resolved except still has loss of taste and smell   Depression    GERD (gastroesophageal reflux disease)    Hyperlipidemia    Hypertension     Past Surgical History:  Procedure Laterality Date   ABDOMINAL HYSTERECTOMY     partial   bladder-partial removal     CYSTOSCOPY W/ URETERAL STENT PLACEMENT Left 11/17/2019   Procedure: CYSTOSCOPY WITH STENT REPLACEMENT;  Surgeon: Franchot Gallo, MD;  Location: [facility];  Service: Urology;  Laterality: Left;   CYSTOSCOPY W/ URETERAL STENT PLACEMENT Left 03/23/2022   Procedure: CYSTOSCOPY WITH RETROGRADE PYELOGRAM, LEFT URETERAL STENT PLACEMENT;  Surgeon: Diona Fanti,  Annie Main, MD;  Location: [facility];  Service: Urology;  Laterality: Left;   CYSTOSCOPY W/ URETERAL STENT REMOVAL Left 05/10/2020   Procedure: CYSTOSCOPY WITH STENT REMOVAL;  Surgeon: Franchot Gallo, MD;  Location: [facility];  Service: Urology;  Laterality: Left;   CYSTOSCOPY W/ URETERAL STENT REMOVAL Left 05/02/2021   Procedure: CYSTOSCOPY WITH STENT REMOVAL;  Surgeon: Franchot Gallo, MD;  Location: [facility];  Service: Urology;  Laterality: Left;   CYSTOSCOPY WITH RETROGRADE PYELOGRAM, URETEROSCOPY AND STENT PLACEMENT Left 06/27/2019   Procedure: CYSTOSCOPY WITH LEFT RETROGRADE PYELOGRAM, URETEROSCOPY, BIOPSY AND LEFT STENT PLACEMENT;  Surgeon: Irine Seal, MD;  Location: [facility];  Service: Urology;  Laterality: Left;   CYSTOSCOPY WITH RETROGRADE PYELOGRAM, URETEROSCOPY AND STENT PLACEMENT Left 08/18/2019   Procedure: CYSTOSCOPY, URETEROSCOPY AND STENT EXCHANGE;  Surgeon: Franchot Gallo, MD;  Location: [facility];  Service: Urology;  Laterality: Left;  63 MINS   CYSTOSCOPY WITH RETROGRADE PYELOGRAM, URETEROSCOPY AND STENT PLACEMENT Left 11/17/2019   Procedure: CYSTOSCOPY WITH RETROGRADE PYELOGRAM, URETEROSCOPY AND STENT PLACEMENT;  Surgeon: Franchot Gallo, MD;  Location: [facility];  Service: Urology;  Laterality: Left;  66 MINS   CYSTOSCOPY WITH RETROGRADE PYELOGRAM, URETEROSCOPY AND STENT PLACEMENT Left 05/10/2020   Procedure: CYSTOSCOPY WITH RETROGRADE PYELOGRAM, URETEROSCOPY AND STENT PLACEMENT WITH URETHRAL DIALATION AND BRUSH BIOPSY;  Surgeon: Franchot Gallo, MD;  Location: [facility];  Service: Urology;  Laterality: Left;  1 HR   CYSTOSCOPY WITH RETROGRADE PYELOGRAM, URETEROSCOPY AND STENT PLACEMENT Left 09/16/2020   Procedure: CYSTOSCOPY WITH RETROGRADE PYELOGRAM, URETEROSCOPY ,  LITHOPAXY, LEFT URETERAL BRUSHING, AND STENT REPLACEMENT;  Surgeon: Franchot Gallo, MD;  Location: [facility];  Service: Urology;  Laterality: Left;   CYSTOSCOPY WITH RETROGRADE PYELOGRAM, URETEROSCOPY AND STENT PLACEMENT Left 10/24/2021   Procedure: CYSTOSCOPY WITH RETROGRADE PYELOGRAM, URETEROSCOPY, POSSIBLE URETERAL AND RENAL BIOPSIES AND STENT PLACEMENT;  Surgeon: Franchot Gallo, MD;  Location: [facility];  Service: Urology;  Laterality: Left;  1 HR   CYSTOSCOPY WITH RETROGRADE PYELOGRAM, URETEROSCOPY AND STENT PLACEMENT Left  03/09/2022   Procedure: CYSTOSCOPY WITH ANTEGRADE PYELOGRAM,  STENT REMOVAL;  Surgeon: Franchot Gallo, MD;  Location: [facility];  Service: Urology;  Laterality: Left;   CYSTOSCOPY/URETEROSCOPY/HOLMIUM LASER/STENT PLACEMENT Left 05/02/2021   Procedure: CYSTOSCOPY/URETEROSCOPY WITH BRUSH BIOPSY/ RETROGRADE PYELOGRAM/ HOLMIUM LASER/STENT REPLACEMENT;  Surgeon: Franchot Gallo, MD;  Location: [facility];  Service: Urology;  Laterality: Left;   HOLMIUM LASER APPLICATION Left 16/10/958   Procedure: HOLMIUM LASER APPLICATION;  Surgeon: Franchot Gallo, MD;  Location: [facility];  Service: Urology;  Laterality: Left;   HOLMIUM LASER APPLICATION Left 4/54/0981   Procedure: HOLMIUM LASER APPLICATION OF TUMORS;  Surgeon: Franchot Gallo, MD;  Location: [facility];  Service: Urology;  Laterality: Left;   IR NEPHROSTOMY PLACEMENT LEFT  07/06/2021   IR NEPHROSTOMY PLACEMENT LEFT  02/06/2022   kidney removed  2006   right    NEPHROSTOMY TUBE REMOVAL Left 03/23/2022   Procedure: NEPHROSTOMY TUBE REMOVAL;  Surgeon: Franchot Gallo, MD;  Location[facility];  Service: Urology;  Laterality: Left;   RIGHT/LEFT HEART CATH AND CORONARY ANGIOGRAPHY N/A 11/06/2019   Procedure: RIGHT/LEFT HEART CATH AND CORONARY ANGIOGRAPHY;  Surgeon: Belva Crome, MD;  Location: [facility];  Service: Cardiovascular;  Laterality: N/A;   THULIUM LASER TURP (TRANSURETHRAL RESECTION OF PROSTATE) Left 08/18/2019   Procedure: THULIUM LASER ABLATION OF URETERAL TUMOR;  Surgeon: Diona Fanti,  Annie Main, MD;  Location: [facility];  Service: Urology;  Laterality: Left;   THULIUM LASER TURP (TRANSURETHRAL RESECTION OF PROSTATE) Left 11/17/2019   Procedure: THULIUM LASER of URETERAL CANCER;  Surgeon: Franchot Gallo, MD;  Location: [facility];  Service: Urology;  Laterality: Left;     Current Hospital Medications: Scheduled Meds:  calcium  carbonate  1 tablet Oral TID WC   carvedilol  3.125 mg Oral BID WC   ezetimibe  10 mg Oral Daily   famotidine  10 mg Oral QODAY   feeding supplement  1 Container Oral TID BM   feeding supplement  237 mL Oral BID BM   rosuvastatin  10 mg Oral q morning   sertraline  50 mg Oral q morning   traZODone  150 mg Oral QHS   Continuous Infusions: PRN Meds:.acetaminophen **OR** acetaminophen, albuterol, hydrALAZINE, lip balm, ondansetron (ZOFRAN) IV, oxybutynin, polyethylene glycol  Allergies:  Allergies  Allergen Reactions   Erythromycin Diarrhea and Nausea And Vomiting   Lotensin [Benazepril Hcl] Cough   Nitrofurantoin Other (See Comments)    Family History  Problem Relation Age of Onset   Hypertension Mother    Diabetes Mellitus II Sister    Hypertension Sister    Hypertension Brother     Social History:  reports that she quit smoking about 18 years ago. Her smoking use included cigarettes. She has a 19.00 pack-year smoking history. She has never used smokeless tobacco. She reports that she does not drink alcohol and does not use drugs.  ROS: A complete review of systems was performed.  All systems are negative except for pertinent findings as noted.  Physical Exam:  Vital signs in last 24 hours: Temp:  [98.6 F (37 C)-99.5 F (37.5 C)] 99.5 F (37.5 C) (08/30 1544) Pulse Rate:  [78-83] 78 (08/30 1544) Resp:  [18-20] 20 (08/30 1544) BP: (159-162)/(62-76) 159/71 (08/30 1544) SpO2:  [94 %-96 %] 96 % (08/30 1544) Weight:  [80.5 kg] 80.5 kg (08/30 0500) General:  Alert and oriented, No acute distress HEENT: Normocephalic, atraumatic Neck: No JVD or lymphadenopathy Cardiovascular: Regular rate  Lungs: Normal inspiratory/expiratory excursion Extremities: No edema Neurologic: Grossly intact  Laboratory Data:  Recent Labs    06/05/22 0501 06/06/22 0619 06/07/22 0539  WBC 16.0* 13.2* 12.6*  HGB 8.5*  8.5* 8.4* 8.4*  HCT 27.8*  27.9* 27.5* 27.2*  PLT 302 288 285     Recent Labs    06/05/22 0500 06/06/22 0619 06/07/22 0539  NA 139 142 137  K 5.1 5.5* 4.6  CL 116* 118* 112*  GLUCOSE 90 94 92  BUN 38* 36* 36*  CALCIUM 8.0* 8.6* 8.5*  CREATININE 3.58* 3.54* 3.64*     Results for orders placed or performed during the hospital encounter of 05/30/22 (from the past 24 hour(s))  Basic metabolic panel     Status: Abnormal   Collection Time: 06/07/22  5:39 AM  Result Value Ref Range   Sodium 137 135 - 145 mmol/L   Potassium 4.6 3.5 - 5.1 mmol/L   Chloride 112 (H) 98 - 111 mmol/L   CO2 19 (L) 22 - 32 mmol/L   Glucose, Bld 92 70 - 99 mg/dL   BUN 36 (H) 8 - 23 mg/dL   Creatinine, Ser 3.64 (H) 0.44 - 1.00 mg/dL   Calcium 8.5 (L) 8.9 - 10.3 mg/dL   GFR, Estimated 13 (L) >60 mL/min   Anion gap 6 5 - 15  CBC with Differential/Platelet  Status: Abnormal   Collection Time: 06/07/22  5:39 AM  Result Value Ref Range   WBC 12.6 (H) 4.0 - 10.5 K/uL   RBC 2.83 (L) 3.87 - 5.11 MIL/uL   Hemoglobin 8.4 (L) 12.0 - 15.0 g/dL   HCT 27.2 (L) 36.0 - 46.0 %   MCV 96.1 80.0 - 100.0 fL   MCH 29.7 26.0 - 34.0 pg   MCHC 30.9 30.0 - 36.0 g/dL   RDW 18.1 (H) 11.5 - 15.5 %   Platelets 285 150 - 400 K/uL   nRBC 0.2 0.0 - 0.2 %   Neutrophils Relative % 75 %   Neutro Abs 9.3 (H) 1.7 - 7.7 K/uL   Lymphocytes Relative 12 %   Lymphs Abs 1.6 0.7 - 4.0 K/uL   Monocytes Relative 8 %   Monocytes Absolute 1.1 (H) 0.1 - 1.0 K/uL   Eosinophils Relative 2 %   Eosinophils Absolute 0.2 0.0 - 0.5 K/uL   Basophils Relative 0 %   Basophils Absolute 0.1 0.0 - 0.1 K/uL   Immature Granulocytes 3 %   Abs Immature Granulocytes 0.33 (H) 0.00 - 0.07 K/uL  Vancomycin, trough     Status: Abnormal   Collection Time: 06/07/22  1:18 PM  Result Value Ref Range   Vancomycin Tr 5 (L) 15 - 20 ug/mL   Recent Results (from the past 240 hour(s))  Culture, blood (routine x 2)     Status: None   Collection Time: 05/30/22 10:00 AM   Specimen: BLOOD  Result Value Ref Range Status    Specimen Description   Final    BLOOD LEFT ANTECUBITAL Performed at [facility], [place] [address], [place], [place] [address]    Special Requests   Final    BOTTLES DRAWN AEROBIC AND ANAEROBIC Blood Culture adequate volume Performed at [facility] [address], [place], [place] [address]    Culture   Final    NO GROWTH 5 DAYS Performed at [facility], [place] [address], [place], [place] [address]    Report Status 06/04/2022 FINAL  Final  Culture, blood (routine x 2)     Status: Abnormal   Collection Time: 05/30/22 10:15 AM   Specimen: BLOOD  Result Value Ref Range Status   Specimen Description   Final    BLOOD RIGHT ANTECUBITAL Performed at [facility] [address], [place], [place] [address]    Special Requests   Final    BOTTLES DRAWN AEROBIC AND ANAEROBIC Blood Culture adequate volume Performed at [facility] [address], [place], [place] [address]    Culture  Setup Time   Final    GRAM POSITIVE COCCI IN CLUSTERS ANAEROBIC BOTTLE ONLY CRITICAL RESULT CALLED TO, READ BACK BY AND VERIFIED WITH: PHARMD M [place] [id number] AT 58 BY CM    Culture (A)  Final    STAPHYLOCOCCUS HOMINIS THE SIGNIFICANCE OF ISOLATING THIS ORGANISM FROM A SINGLE SET OF BLOOD CULTURES WHEN MULTIPLE SETS ARE DRAWN IS UNCERTAIN. PLEASE NOTIFY THE MICROBIOLOGY DEPARTMENT WITHIN ONE WEEK IF SPECIATION AND SENSITIVITIES ARE REQUIRED. Performed at [facility], [place] [address] [place], [place] [address]    Report Status 06/02/2022 FINAL  Final  Blood Culture ID Panel (Reflexed)     Status: Abnormal   Collection Time: 05/30/22 10:15 AM  Result Value Ref Range Status   Enterococcus faecalis NOT DETECTED NOT DETECTED Final   Enterococcus Faecium NOT DETECTED NOT DETECTED Final  Listeria monocytogenes NOT DETECTED NOT DETECTED Final   Staphylococcus species DETECTED (A) NOT DETECTED Final    Comment: CRITICAL  RESULT CALLED TO, READ BACK BY AND VERIFIED WITH: PHARMD M SWAYNE [id number] AT 1158 BY CM    Staphylococcus aureus (BCID) NOT DETECTED NOT DETECTED Final   Staphylococcus epidermidis NOT DETECTED NOT DETECTED Final   Staphylococcus lugdunensis NOT DETECTED NOT DETECTED Final   Streptococcus species NOT DETECTED NOT DETECTED Final   Streptococcus agalactiae NOT DETECTED NOT DETECTED Final   Streptococcus pneumoniae NOT DETECTED NOT DETECTED Final   Streptococcus pyogenes NOT DETECTED NOT DETECTED Final   A.calcoaceticus-baumannii NOT DETECTED NOT DETECTED Final   Bacteroides fragilis NOT DETECTED NOT DETECTED Final   Enterobacterales NOT DETECTED NOT DETECTED Final   Enterobacter cloacae complex NOT DETECTED NOT DETECTED Final   Escherichia coli NOT DETECTED NOT DETECTED Final   Klebsiella aerogenes NOT DETECTED NOT DETECTED Final   Klebsiella oxytoca NOT DETECTED NOT DETECTED Final   Klebsiella pneumoniae NOT DETECTED NOT DETECTED Final   Proteus species NOT DETECTED NOT DETECTED Final   Salmonella species NOT DETECTED NOT DETECTED Final   Serratia marcescens NOT DETECTED NOT DETECTED Final   Haemophilus influenzae NOT DETECTED NOT DETECTED Final   Neisseria meningitidis NOT DETECTED NOT DETECTED Final   Pseudomonas aeruginosa NOT DETECTED NOT DETECTED Final   Stenotrophomonas maltophilia NOT DETECTED NOT DETECTED Final   Candida albicans NOT DETECTED NOT DETECTED Final   Candida auris NOT DETECTED NOT DETECTED Final   Candida glabrata NOT DETECTED NOT DETECTED Final   Candida krusei NOT DETECTED NOT DETECTED Final   Candida parapsilosis NOT DETECTED NOT DETECTED Final   Candida tropicalis NOT DETECTED NOT DETECTED Final   Cryptococcus neoformans/gattii NOT DETECTED NOT DETECTED Final    Comment: Performed at [facility], [address], [place], [place] [address]  Resp Panel by RT-PCR (Flu A&B, Covid) Anterior Nasal Swab     Status: None   Collection Time: 05/30/22 10:16 AM    Specimen: Anterior Nasal Swab  Result Value Ref Range Status   SARS Coronavirus 2 by RT PCR NEGATIVE NEGATIVE Final    Comment: (NOTE) SARS-CoV-2 target nucleic acids are NOT DETECTED.  The SARS-CoV-2 RNA is generally detectable in upper respiratory specimens during the acute phase of infection. The lowest concentration of SARS-CoV-2 viral copies this assay can detect is 138 copies/mL. A negative result does not preclude SARS-Cov-2 infection and should not be used as the sole basis for treatment or other patient management decisions. A negative result may occur with  improper specimen collection/handling, submission of specimen other than nasopharyngeal swab, presence of viral mutation(s) within the areas targeted by this assay, and inadequate number of viral copies(<138 copies/mL). A negative result must be combined with clinical observations, patient history, and epidemiological information. The expected result is Negative.  Fact Sheet for Patients:  EntrepreneurPulse.com.au  Fact Sheet for Healthcare Providers:  IncredibleEmployment.be  This test is no t yet approved or cleared by the [place] FDA and  has been authorized for detection and/or diagnosis of SARS-CoV-2 by FDA under an Emergency Use Authorization (EUA). This EUA will remain  in effect (meaning this test can be used) for the duration of the COVID-19 declaration under Section 564(b)(1) of the Act, 21 U.S.C.section 360bbb-3(b)(1), unless the authorization is terminated  or revoked sooner.       Influenza A by PCR NEGATIVE NEGATIVE Final   Influenza B by PCR NEGATIVE NEGATIVE Final    Comment: (  NOTE) The Xpert Xpress SARS-CoV-2/FLU/RSV plus assay is intended as an aid in the diagnosis of influenza from Nasopharyngeal swab specimens and should not be used as a sole basis for treatment. Nasal washings and aspirates are unacceptable for Xpert Xpress  SARS-CoV-2/FLU/RSV testing.  Fact Sheet for Patients: EntrepreneurPulse.com.au  Fact Sheet for Healthcare Providers: IncredibleEmployment.be  This test is not yet approved or cleared by the [place] FDA and has been authorized for detection and/or diagnosis of SARS-CoV-2 by FDA under an Emergency Use Authorization (EUA). This EUA will remain in effect (meaning this test can be used) for the duration of the COVID-19 declaration under Section 564(b)(1) of the Act, 21 U.S.C. section 360bbb-3(b)(1), unless the authorization is terminated or revoked.  Performed at [facility], [place] [address] [place], [place] [address]   Urine Culture     Status: Abnormal   Collection Time: 05/30/22  3:49 PM   Specimen: Urine, Clean Catch  Result Value Ref Range Status   Specimen Description   Final    URINE, CLEAN CATCH Performed at [facility], [place] [address], [place], [place] [address]    Special Requests   Final    NONE Performed at [facility], [place] [address], [place], [place] [address]    Culture 30,000 COLONIES/mL CITROBACTER FREUNDII (A)  Final   Report Status 06/01/2022 FINAL  Final   Organism ID, Bacteria CITROBACTER FREUNDII (A)  Final      Susceptibility   Citrobacter freundii - MIC*    CEFAZOLIN >=64 RESISTANT Resistant     CEFEPIME 0.25 SENSITIVE Sensitive     CEFTRIAXONE >=64 RESISTANT Resistant     CIPROFLOXACIN <=0.25 SENSITIVE Sensitive     GENTAMICIN <=1 SENSITIVE Sensitive     IMIPENEM <=0.25 SENSITIVE Sensitive     NITROFURANTOIN <=16 SENSITIVE Sensitive     TRIMETH/SULFA <=20 SENSITIVE Sensitive     PIP/TAZO 32 INTERMEDIATE Intermediate     * 30,000 COLONIES/mL CITROBACTER FREUNDII  Culture, blood (Routine X 2) w Reflex to ID Panel     Status: None   Collection Time: 06/01/22 11:56 AM   Specimen: BLOOD  Result Value Ref Range Status   Specimen Description   Final     BLOOD BLOOD RIGHT ARM Performed at [facility] [address], [place], [place] [address]    Special Requests   Final    BOTTLES DRAWN AEROBIC AND ANAEROBIC Blood Culture adequate volume Performed at [facility] [address], [place], [place] [address]    Culture   Final    NO GROWTH 5 DAYS Performed at [facility], [place] [address], [place], [place] [address]    Report Status 06/06/2022 FINAL  Final  Culture, blood (Routine X 2) w Reflex to ID Panel     Status: None   Collection Time: 06/01/22 11:57 AM   Specimen: BLOOD  Result Value Ref Range Status   Specimen Description   Final    BLOOD BLOOD LEFT HAND Performed at [facility] [address], [place], [place] [address]    Special Requests   Final    BOTTLES DRAWN AEROBIC AND ANAEROBIC Blood Culture adequate volume Performed at [facility] [address], [place], [place] [address]    Culture   Final    NO GROWTH 5 DAYS Performed at [facility], [place] [address], [place], [place] [address]    Report Status 06/06/2022 FINAL  Final    Renal Function: Recent Labs    06/01/22 0601 06/02/22 0524 06/03/22 0606 06/04/22 0820 06/05/22 0500 06/06/22 0619 06/07/22 0539  CREATININE 2.83* 2.91* 3.09* 3.11* 3.58* 3.54* 3.64*   Estimated Creatinine Clearance: 13.8 mL/min (A) (by C-G formula based on SCr of 3.64 mg/dL (H)).  Radiologic Imaging: No results found.  I independently reviewed the above imaging studies.  Impression/Assessment:  1.  History of urothelial carcinoma, initially right ureter.  Now with a solitary kidney on the left after nephro ureterectomy in 2008.  She does have evidence of low-volume disease in her left distal ureter and left calyceal system which has been treated with intrapelvic Jelmyto.  She has not tolerated this well.  She did have a PET scan done within the past 3 months revealing no evident active  disease intra or extra renal.  2.  Renal insufficiency.  Mild hydronephrosis on renal ultrasound.  If there was significant obstruction, she would be an uric as she does have a solitary left kidney.  There is no doubt that she is getting urine from her renal pelvis into the bladder.  She does not have a ureteral jet because she has a stent in place.  Could a small degree of obstruction of the stent, causing perhaps increased renal pelvic pressure, decrease her GFR?  Possibly.  Plan:  1.  I spoke with Jenny Reichmann about changing her stent out.  Certainly, I think this would answer the question of whether there is mild obstruction from the stent as 1 would expect fairly sudden decrease in creatinine.  I will see about putting her on for Friday midday for performing this  2.  This would also give her a new stent, while on antibiotics, and hopefully decrease the risk of recurring infections.

## 2022-06-07 NOTE — Progress Notes (Signed)
[facility] PROGRESS NOTE   [name] OEU:[id number] DOB: 1946-06-01 DOA: 05/30/2022  PCP: [name], MD  Brief History/Interval Summary: 76 y.o. female with PMH significant for urothelial cancer, HTN, HLD, CAD, CHF, CKD, PE in July 2023, GERD, anxiety depression. Patient has history of urothelial cancer.  Several years ago, she had high-grade urothelial carcinoma of the right distal ureter and underwent right nephro ureterectomy.  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  It was then treated with laser ablation.  In 2022, she was found to have a small volume recurrence in the left calyceal system.  It was treated with focal ablation but also with injection of Jelmyto in the left collecting system through her nephrostomy tube.  She was started on her second round of chemotherapy which was supposed to go for 6 weeks but she was not able to continue that for more than 3 weeks because of significant pain and left ureteral obstruction. On 03/23/2022, she underwent removal of nephrostomy tube and placement of DJ stent on the left.  Subsequently she had UTI as an outpatient twice treated with antibiotics. 7/14 to 7/19, patient was hospitalized with fever, nausea, vomiting, generalized weakness.  She was noted to have sepsis secondary to left-sided pyelonephritis.  Blood culture grew staph hominis and capitis as well as Staph epidermidis.  Per ID recommendation, she was treated with IV vancomycin and was discharged on 9 days of oral Zyvox which she completed post discharge.  Ureteral stent was left in place.  During that hospitalization, patient was also found to have a small right lower lobe pulm embolism and was started on Eliquis following with long-term aspirin.  Patient states that since starting Eliquis, she started to get hematuria. Patient was recently hospitalized 8/7 to 8/8 for low  hemoglobin, FOBT positive and urinalysis with RBCs.  She underwent 2 units of blood transfusion.  [facility] and urologist Dr. Gloriann Loan were consulted.  Hemoglobin stabilized and she was discharged without intervention.  Patient was advised to stop baby aspirin and any other NSAIDs. In the interval, she saw her PCP and was resumed again on Eliquis at half dose of 2.5 mg twice daily and a baby aspirin.  She has started to notice hematuria again. She was in her usual state of health until last night.  Per husband, last night she started to feel warm and had chills.  Around 3 AM this morning, she started having nausea and vomited twice.  She started to get weak and hence EMS was called. EMS noted lobe pressure and give normal saline bolus in route to the ED.   In the ED, patient was afebrile, heart rate in 80s, had a low blood pressure in 80s, breathing on room air Labs with BUN/creatinine elevated to 32/3.17, troponin elevated to 230, lactic acid elevated to 3.7 WBC count elevated to 21.6, hemoglobin low at 9.1, glucose elevated to 120 Urinalysis with cloudy yellow urine with moderate amount of hemoglobin, large amount of leukocytes, positive nitrite, many bacteria EKG in normal sinus rhythm with QTc 459 ms Admitted to hospitalist service   Consultants: Nephrology.  Urology.  Procedures: None yet    Subjective/Interval History: Has been passing urine but dark in color.  Complains of some discomfort in the left side of her abdomen at times but not currently.  No nausea or vomiting.     Assessment/Plan:  Urinary tract  infection, complicated/sepsis present on admission Presented with nausea vomiting chills leukocytosis lactic acidosis and acute kidney injury.  Recent hospitalization for pyelonephritis and coagulase-negative staph bacteremia treated with vancomycin and then with linezolid. Started again on broad-spectrum antibiotics.  Urine culture grew 30,000 colonies of Citrobacter.  ID consult was  obtained.  Switched over to ciprofloxacin.  Looks like she has completed course of antibiotics.  WBC has improved.  She remains afebrile.  Staphylococcus hominis blood This was noted in 1 out of 4 cultures.  Thought to be a contaminant.  Acute kidney injury on chronic kidney disease stage IIIb Baseline creatinine is 1.5.  Presented with creatinine of 3.17.  Nephrology is following.  Appears to have plateaued.  Renal ultrasound did suggest left hydronephrosis which was seen on previous studies as well.  Discussed with urology who will evaluate patient today.  History of urothelial cancer status post left ureteral stent S/p right distal ureter and underwent right nephro ureterectomy several years ago Recurrence in left ureter in 2020 s/p focal ablation Recurrence in left calyceal system in 2022.  Was getting chemotherapy to nephrostomy tube under the care of Dr. Diona Fanti.  It was stopped because of intolerance and left ureteral obstruction S/p left ureter DJ stent 03/23/2022 Continue Ditropan Dr. Diona Fanti will see the patient later today.  Hyperkalemia Resolved this morning.  Normocytic anemia/chronic blood loss anemia Some of the anemia is likely due to hematuria as well.  Hemoglobin was apparently 12 until May 2023 and has been gradually trending down since then.  Hemoglobin was 6.9 on 8/27 and she was transfused 1 unit of PRBC.  Stable now.  Continue to monitor.  Elevated troponin Likely demand ischemia.  No chest pain.  Hypotension Blood pressures were in the 80s at presentation.  Have improved.  Noted to be on carvedilol.  Continue to monitor closely.  History of congestive heart failure Had systolic CHF based on echo from 2020 when a EF of 40 to 45%.  Based on echo from August of this year EF has improved to 60 to 65%.  Grade 1 diastolic dysfunction was noted. Seems to be fairly euvolemic.  Noted to be on carvedilol which is continued.  History of coronary artery disease Stable.   Continue home medications.  Acute pulmonary embolism Diagnosed in July 2023.  This was a small PE in the right lower lobe pulmonary artery.  She was started on Eliquis however has not been able to tolerate it due to recurrent hematuria.  Currently both Eliquis and aspirin are on hold.  History of anxiety and depression Continue home medications.  Obesity Estimated body mass index is 31.44 kg/m as calculated from the following:   Height as of this encounter: '5\' 3"'$  (1.6 m).   Weight as of this encounter: 80.5 kg.   DVT Prophylaxis: SCDs Code Status: Full code Family Communication: Discussed with patient Disposition Plan: Start mobilizing.  Hopefully return home when improved  Status is: Inpatient Remains inpatient appropriate because: Acute kidney injury      Medications: Scheduled:  calcium carbonate  1 tablet Oral TID WC   carvedilol  3.125 mg Oral BID WC   ezetimibe  10 mg Oral Daily   famotidine  10 mg Oral QODAY   feeding supplement  1 Container Oral TID BM   feeding supplement  237 mL Oral BID BM   rosuvastatin  10 mg Oral q morning   sertraline  50 mg Oral q morning   traZODone  150 mg Oral QHS  Continuous: YTK:[id number] **OR** acetaminophen, albuterol, hydrALAZINE, lip balm, ondansetron (ZOFRAN) IV, oxybutynin, polyethylene glycol  Antibiotics: Anti-infectives (From admission, onward)    Start     Dose/Rate Route Frequency Ordered Stop   06/02/22 1100  ciprofloxacin (CIPRO) tablet 250 mg        250 mg Oral Daily 06/02/22 0943 06/07/22 0812   05/31/22 1100  ceFEPIme (MAXIPIME) 2 g in sodium chloride 0.9 % 100 mL IVPB  Status:  Discontinued        2 g 200 mL/hr over 30 Minutes Intravenous Every 24 hours 05/30/22 1518 06/02/22 0943   05/30/22 1517  vancomycin variable dose per unstable renal function (pharmacist dosing)  Status:  Discontinued         Does not apply See admin instructions 05/30/22 1518 05/31/22 1214   05/30/22 1030  ceFEPIme (MAXIPIME) 2 g  in sodium chloride 0.9 % 100 mL IVPB        2 g 200 mL/hr over 30 Minutes Intravenous  Once 05/30/22 1024 05/30/22 1125   05/30/22 1030  vancomycin (VANCOREADY) IVPB 1500 mg/300 mL        1,500 mg 150 mL/hr over 120 Minutes Intravenous  Once 05/30/22 1024 05/30/22 1242       Objective:  Vital Signs  Vitals:   06/06/22 1343 06/06/22 2024 06/07/22 0434 06/07/22 0500  BP: (!) 158/73 (!) 160/76 (!) 162/62   Pulse: 78 79 83   Resp: '17 18 19   '$ Temp: 98 F (36.7 C) 98.6 F (37 C) 98.7 F (37.1 C)   TempSrc: Oral     SpO2: 98% 95% 94%   Weight:    80.5 kg  Height:        Intake/Output Summary (Last 24 hours) at 06/07/2022 1226 Last data filed at 06/06/2022 1700 Gross per 24 hour  Intake 240 ml  Output 450 ml  Net -210 ml   Filed Weights   06/04/22 1739 06/06/22 0448 06/07/22 0500  Weight: (!) 178 kg 82.3 kg 80.5 kg    General appearance: Awake alert.  In no distress Resp: Clear to auscultation bilaterally.  Normal effort Cardio: S1-S2 is normal regular.  No S3-S4.  No rubs murmurs or bruit GI: Abdomen is soft.  Nontender nondistended.  Bowel sounds are present normal.  No masses organomegaly Extremities: No edema.  Full range of motion of lower extremities. Neurologic: Alert and oriented x3.  No focal neurological deficits.    Lab Results:  Data Reviewed: I have personally reviewed following labs and reports of the imaging studies  CBC: Recent Labs  Lab 06/03/22 0606 06/04/22 0820 06/05/22 0501 06/06/22 0619 06/07/22 0539  WBC 15.1* 14.7* 16.0* 13.2* 12.6*  NEUTROABS 11.0*  --   --  9.2* 9.3*  HGB 7.4* 6.9* 8.5*  8.5* 8.4* 8.4*  HCT 25.0* 23.4* 27.8*  27.9* 27.5* 27.2*  MCV 98.4 97.9 97.9 96.2 96.1  PLT 309 286 302 288 220    Basic Metabolic Panel: Recent Labs  Lab 06/03/22 0606 06/04/22 0820 06/05/22 0500 06/06/22 0619 06/07/22 0539  NA 144 138 139 142 137  K 5.2* 5.2* 5.1 5.5* 4.6  CL 119* 117* 116* 118* 112*  CO2 20* 17* 18* 20* 19*  GLUCOSE  95 88 90 94 92  BUN 38* 34* 38* 36* 36*  CREATININE 3.09* 3.11* 3.58* 3.54* 3.64*  CALCIUM 8.3* 7.8* 8.0* 8.6* 8.5*    GFR: Estimated Creatinine Clearance: 13.8 mL/min (A) (by C-G formula based on SCr of 3.64 mg/dL (H)).  Recent Results (from the past 240 hour(s))  Culture, blood (routine x 2)     Status: None   Collection Time: 05/30/22 10:00 AM   Specimen: BLOOD  Result Value Ref Range Status   Specimen Description   Final    BLOOD LEFT ANTECUBITAL Performed at [facility] [address] [place], [place] [address]    Special Requests   Final    BOTTLES DRAWN AEROBIC AND ANAEROBIC Blood Culture adequate volume Performed at [facility] [address] [place], [place] [address]    Culture   Final    NO GROWTH 5 DAYS Performed at [facility], [place] [address], [place], [place] [address]    Report Status 06/04/2022 FINAL  Final  Culture, blood (routine x 2)     Status: Abnormal   Collection Time: 05/30/22 10:15 AM   Specimen: BLOOD  Result Value Ref Range Status   Specimen Description   Final    BLOOD RIGHT ANTECUBITAL Performed at [facility] [address], [place], [place] [address]    Special Requests   Final    BOTTLES DRAWN AEROBIC AND ANAEROBIC Blood Culture adequate volume Performed at [facility] [address], [place], [place] [address]    Culture  Setup Time   Final    GRAM POSITIVE COCCI IN CLUSTERS ANAEROBIC BOTTLE ONLY CRITICAL RESULT CALLED TO, READ BACK BY AND VERIFIED WITH: PHARMD M [place] [id number] AT 27 BY CM    Culture (A)  Final    STAPHYLOCOCCUS HOMINIS THE SIGNIFICANCE OF ISOLATING THIS ORGANISM FROM A SINGLE SET OF BLOOD CULTURES WHEN MULTIPLE SETS ARE DRAWN IS UNCERTAIN. PLEASE NOTIFY THE MICROBIOLOGY DEPARTMENT WITHIN ONE WEEK IF SPECIATION AND SENSITIVITIES ARE REQUIRED. Performed at [facility], [place] [address] [place], [place] [address]     Report Status 06/02/2022 FINAL  Final  Blood Culture ID Panel (Reflexed)     Status: Abnormal   Collection Time: 05/30/22 10:15 AM  Result Value Ref Range Status   Enterococcus faecalis NOT DETECTED NOT DETECTED Final   Enterococcus Faecium NOT DETECTED NOT DETECTED Final   Listeria monocytogenes NOT DETECTED NOT DETECTED Final   Staphylococcus species DETECTED (A) NOT DETECTED Final    Comment: CRITICAL RESULT CALLED TO, READ BACK BY AND VERIFIED WITH: PHARMD M [place] [id number] AT 1158 BY CM    Staphylococcus aureus (BCID) NOT DETECTED NOT DETECTED Final   Staphylococcus epidermidis NOT DETECTED NOT DETECTED Final   Staphylococcus lugdunensis NOT DETECTED NOT DETECTED Final   Streptococcus species NOT DETECTED NOT DETECTED Final   Streptococcus agalactiae NOT DETECTED NOT DETECTED Final   Streptococcus pneumoniae NOT DETECTED NOT DETECTED Final   Streptococcus pyogenes NOT DETECTED NOT DETECTED Final   A.calcoaceticus-baumannii NOT DETECTED NOT DETECTED Final   Bacteroides fragilis NOT DETECTED NOT DETECTED Final   Enterobacterales NOT DETECTED NOT DETECTED Final   Enterobacter cloacae complex NOT DETECTED NOT DETECTED Final   Escherichia coli NOT DETECTED NOT DETECTED Final   Klebsiella aerogenes NOT DETECTED NOT DETECTED Final   Klebsiella oxytoca NOT DETECTED NOT DETECTED Final   Klebsiella pneumoniae NOT DETECTED NOT DETECTED Final   Proteus species NOT DETECTED NOT DETECTED Final   Salmonella species NOT DETECTED NOT DETECTED Final   Serratia marcescens NOT DETECTED NOT DETECTED Final   Haemophilus influenzae NOT DETECTED NOT DETECTED Final   Neisseria meningitidis NOT DETECTED NOT DETECTED Final   Pseudomonas aeruginosa NOT DETECTED NOT DETECTED Final  Stenotrophomonas maltophilia NOT DETECTED NOT DETECTED Final   Candida albicans NOT DETECTED NOT DETECTED Final   Candida auris NOT DETECTED NOT DETECTED Final   Candida glabrata NOT DETECTED NOT DETECTED Final   Candida krusei  NOT DETECTED NOT DETECTED Final   Candida parapsilosis NOT DETECTED NOT DETECTED Final   Candida tropicalis NOT DETECTED NOT DETECTED Final   Cryptococcus neoformans/gattii NOT DETECTED NOT DETECTED Final    Comment: Performed at [facility], [place] [address] [place], [place] [address]  Resp Panel by RT-PCR (Flu A&B, Covid) Anterior Nasal Swab     Status: None   Collection Time: 05/30/22 10:16 AM   Specimen: Anterior Nasal Swab  Result Value Ref Range Status   SARS Coronavirus 2 by RT PCR NEGATIVE NEGATIVE Final    Comment: (NOTE) SARS-CoV-2 target nucleic acids are NOT DETECTED.  The SARS-CoV-2 RNA is generally detectable in upper respiratory specimens during the acute phase of infection. The lowest concentration of SARS-CoV-2 viral copies this assay can detect is 138 copies/mL. A negative result does not preclude SARS-Cov-2 infection and should not be used as the sole basis for treatment or other patient management decisions. A negative result may occur with  improper specimen collection/handling, submission of specimen other than nasopharyngeal swab, presence of viral mutation(s) within the areas targeted by this assay, and inadequate number of viral copies(<138 copies/mL). A negative result must be combined with clinical observations, patient history, and epidemiological information. The expected result is Negative.  Fact Sheet for Patients:  EntrepreneurPulse.com.au  Fact Sheet for Healthcare Providers:  IncredibleEmployment.be  This test is no t yet approved or cleared by the [place] FDA and  has been authorized for detection and/or diagnosis of SARS-CoV-2 by FDA under an Emergency Use Authorization (EUA). This EUA will remain  in effect (meaning this test can be used) for the duration of the COVID-19 declaration under Section 564(b)(1) of the Act, 21 U.S.C.section 360bbb-3(b)(1), unless the authorization is terminated  or  revoked sooner.       Influenza A by PCR NEGATIVE NEGATIVE Final   Influenza B by PCR NEGATIVE NEGATIVE Final    Comment: (NOTE) The Xpert Xpress SARS-CoV-2/FLU/RSV plus assay is intended as an aid in the diagnosis of influenza from Nasopharyngeal swab specimens and should not be used as a sole basis for treatment. Nasal washings and aspirates are unacceptable for Xpert Xpress SARS-CoV-2/FLU/RSV testing.  Fact Sheet for Patients: EntrepreneurPulse.com.au  Fact Sheet for Healthcare Providers: IncredibleEmployment.be  This test is not yet approved or cleared by the [place] FDA and has been authorized for detection and/or diagnosis of SARS-CoV-2 by FDA under an Emergency Use Authorization (EUA). This EUA will remain in effect (meaning this test can be used) for the duration of the COVID-19 declaration under Section 564(b)(1) of the Act, 21 U.S.C. section 360bbb-3(b)(1), unless the authorization is terminated or revoked.  Performed at [facility], [place] [address], [place], [place] [address]   Urine Culture     Status: Abnormal   Collection Time: 05/30/22  3:49 PM   Specimen: Urine, Clean Catch  Result Value Ref Range Status   Specimen Description   Final    URINE, CLEAN CATCH Performed at [facility], [place] [address] [place], [place] [address]    Special Requests   Final    NONE Performed at [facility], [place] [address], [place], [place] [address]    Culture 30,000 COLONIES/mL CITROBACTER FREUNDII (A)  Final  Report Status 06/01/2022 FINAL  Final   Organism ID, Bacteria CITROBACTER FREUNDII (A)  Final      Susceptibility   Citrobacter freundii - MIC*    CEFAZOLIN >=64 RESISTANT Resistant     CEFEPIME 0.25 SENSITIVE Sensitive     CEFTRIAXONE >=64 RESISTANT Resistant     CIPROFLOXACIN <=0.25 SENSITIVE Sensitive     GENTAMICIN <=1 SENSITIVE Sensitive     IMIPENEM <=0.25  SENSITIVE Sensitive     NITROFURANTOIN <=16 SENSITIVE Sensitive     TRIMETH/SULFA <=20 SENSITIVE Sensitive     PIP/TAZO 32 INTERMEDIATE Intermediate     * 30,000 COLONIES/mL CITROBACTER FREUNDII  Culture, blood (Routine X 2) w Reflex to ID Panel     Status: None   Collection Time: 06/01/22 11:56 AM   Specimen: BLOOD  Result Value Ref Range Status   Specimen Description   Final    BLOOD BLOOD RIGHT ARM Performed at [facility] [address] [place], [place] [address]    Special Requests   Final    BOTTLES DRAWN AEROBIC AND ANAEROBIC Blood Culture adequate volume Performed at [facility] [address], [place], [place] [address]    Culture   Final    NO GROWTH 5 DAYS Performed at [facility], [place] [address], [place], [place] [address]    Report Status 06/06/2022 FINAL  Final  Culture, blood (Routine X 2) w Reflex to ID Panel     Status: None   Collection Time: 06/01/22 11:57 AM   Specimen: BLOOD  Result Value Ref Range Status   Specimen Description   Final    BLOOD BLOOD LEFT HAND Performed at [facility] [address] [place], [place] [address]    Special Requests   Final    BOTTLES DRAWN AEROBIC AND ANAEROBIC Blood Culture adequate volume Performed at [facility] [address] [place], [place] [address]    Culture   Final    NO GROWTH 5 DAYS Performed at [facility], [place] [address], [place], [place] [address]    Report Status 06/06/2022 FINAL  Final      Radiology Studies: DG CHEST PORT 1 VIEW  Result Date: 06/05/2022 CLINICAL DATA:  Acute renal failure.  Congestive heart failure. EXAM: PORTABLE CHEST 1 VIEW COMPARISON:  AP chest 05/30/2022 FINDINGS: Mildly decreased lung volumes. Cardiac silhouette is mildly enlarged. Mediastinal contours are within normal limits. Mild bibasilar linear subsegmental atelectasis. No high-grade focal airspace opacity. No definite pleural  effusion. No pneumothorax. Mild multilevel degenerative disc changes of the thoracic spine. IMPRESSION: Mild multilevel degenerative disc changes of the thoracic spine. Electronically Signed   By: Yvonne Kendall M.D.   On: 06/05/2022 17:16   US RENAL  Result Date: 06/05/2022 CLINICAL DATA:  Acute renal failure EXAM: RENAL / URINARY TRACT ULTRASOUND COMPLETE COMPARISON:  None Available. FINDINGS: Right Kidney: Surgically absent. Left Kidney: Renal measurements: 12.8 x 7.2 x 6.6 cm = volume: 315.1 mL. Echogenicity within normal limits. No mass visualized. Mild hydronephrosis. Bladder: Appears normal for degree of bladder distention. Left ureter jet not visualized. Other: Left pleural effusion. IMPRESSION: 1. Mild left hydronephrosis, ureter jet not visualized which is suggestive of obstruction. 2. Surgically absent right kidney. Electronically Signed   By: Yetta Glassman M.D.   On: 06/05/2022 17:10       LOS: 8 days   Luca Burston [employer] on [email or web address]  06/07/2022, 12:26 PM

## 2022-06-08 ENCOUNTER — Other Ambulatory Visit: Payer: Self-pay | Admitting: Urology

## 2022-06-08 DIAGNOSIS — C689 Malignant neoplasm of urinary organ, unspecified: Secondary | ICD-10-CM | POA: Diagnosis not present

## 2022-06-08 DIAGNOSIS — N179 Acute kidney failure, unspecified: Secondary | ICD-10-CM | POA: Diagnosis not present

## 2022-06-08 DIAGNOSIS — D649 Anemia, unspecified: Secondary | ICD-10-CM | POA: Diagnosis not present

## 2022-06-08 LAB — CBC
HCT: 27.4 % — ABNORMAL LOW (ref 36.0–46.0)
Hemoglobin: 8.4 g/dL — ABNORMAL LOW (ref 12.0–15.0)
MCH: 29.5 pg (ref 26.0–34.0)
MCHC: 30.7 g/dL (ref 30.0–36.0)
MCV: 96.1 fL (ref 80.0–100.0)
Platelets: 272 10*3/uL (ref 150–400)
RBC: 2.85 MIL/uL — ABNORMAL LOW (ref 3.87–5.11)
RDW: 17.5 % — ABNORMAL HIGH (ref 11.5–15.5)
WBC: 12.2 10*3/uL — ABNORMAL HIGH (ref 4.0–10.5)
nRBC: 0 % (ref 0.0–0.2)

## 2022-06-08 LAB — BASIC METABOLIC PANEL
Anion gap: 5 (ref 5–15)
BUN: 35 mg/dL — ABNORMAL HIGH (ref 8–23)
CO2: 23 mmol/L (ref 22–32)
Calcium: 8.6 mg/dL — ABNORMAL LOW (ref 8.9–10.3)
Chloride: 109 mmol/L (ref 98–111)
Creatinine, Ser: 3.53 mg/dL — ABNORMAL HIGH (ref 0.44–1.00)
GFR, Estimated: 13 mL/min — ABNORMAL LOW (ref >60)
Glucose, Bld: 100 mg/dL — ABNORMAL HIGH (ref 70–99)
Potassium: 4.4 mmol/L (ref 3.5–5.1)
Sodium: 137 mmol/L (ref 135–145)

## 2022-06-08 NOTE — Progress Notes (Signed)
[facility] Progress Note   76 y.o. female HTN, HLD, CASHD, dCHF, PE 04/2022, right nephro ureterectomy 2006 for high grade urothelial carcinoma, left sided urothelial cancer (renal pelvis and ureter) discovered 05/2019 treated with laser ablation. Then recurrence on left side in 2022 ltx initially with Jelmyto d/c bec of obstruction. S/p  double J stent + nephrostogram through left nephrostomy tube + removal of nephrostomy tube on 03/23/2022. Patient recently admitted  7/14-7/19 with Staph hominis bacteremia tx with Vancomycin transitioning to linezolid. Patient also with cictrobacter freundii + urine cultures. Patient was also on Eliquis for a PE discovered during that hospitalization. Patient now presenting with nausea, vomiting, anorexia, fatigue found to be afebrile in the ED but SBP was in the 80's. BUN/Cr was also 35/2.57  with a WBC of 21.6. Cr was in the 1.3-1.5 range in 04/21/22 - 05/16/22. Ultrasound showed normal cortical thickness, slightly increased cortical echogenicity, mild LEFT hydronephrosis.  Assessment/ Plan:   Acute kidney injury on CKD 3 w/ a baseline cr 1.3-1.5. Unclear etiology of AKI as by history and PE the patient does not appear +10.5 L. May have ATN from the UTI vs obstructive uropathy. I would think it's too early for AIN and her renal function was already worsening immediately upon admission. - Strict I&O's, daily weights (stable at 80kg) - Renal ultrasound only shows mild left sided hydro but this is in the context of a solitary kidney. CXR does not show congestion. - Did not see pigmented casts but some normomorphic RBC, florid WBC's c/w UTI.  - UOP is good and cr stable but not really improving. AKI likely associated with the UTI or it may be mild ATN.  - Vancomycin trough level only 5   No absolute indication for RRT and the patient appears to be  comfortable. Usually if there is a question of cause of AKI or to r/o AIN I would biopsy but a solitary kidney  is a contraindication.  Appreciate Dr. Diona Fanti seeing the patient; they will possibly change out the stent on Fri. I agree that the mild hydronephrosis is not the cause of the AKI but wonder if an ongoing infection is contributing.   -Maintain MAP>65 for optimal renal perfusion; actually hypertensive at this point.  - Avoid nephrotoxic agents such as IV contrast, NSAIDs, and phosphate containing bowel preps (FLEETS)   UTI growing Citrobacter currently on ciprofloxacin. Could the stent be the source of the infection? J stent is still present. Aneimia - unable to give IV iron w active infection. Transfuse if symptomatic or if Hb drops below 7.  Initially hypotenstive but now hypertensive being appropriately treated. H/o PE - but Eliquis and ASA on hold for hematuria Urothelial cancer  Subjective:   Feeling better, no nausea but appetite still poor. She is forcing herself to eat part of the meal (very little protein intake tho).   Objective:   BP (!) 158/73 (BP Location: Right Arm)   Pulse 76   Temp 98.1 F (36.7 C)   Resp 18   Ht '5\' 3"'$  (1.6 m)   Wt 80 kg   SpO2 94%   BMI 31.25 kg/m   Intake/Output Summary (Last 24 hours) at 06/08/2022 1216 Last data filed at 06/08/2022 1010 Gross per 24 hour  Intake 1166 ml  Output 750 ml  Net 416 ml   Weight change: -0.499 kg  Physical Exam: General appearance: alert, cooperative Head: NCAT Neck: no adenopathy Back: symmetric, no curvature. No CVA tenderness. Resp: rales bibasilar  and bilaterally (dry rales) Cardio: regular rate and rhythm GI: soft, non-tender; bowel sounds normal Extremities: edema tr Pulses: 2+ and symmetric Skin: No rashes or lesions  Imaging: No results found.  Labs: BMET Recent Labs  Lab 06/02/22 0524 06/03/22 0606 06/04/22 0820 06/05/22 0500 06/06/22 0619 06/07/22 0539 06/08/22 0520  NA 142 144 138 139 142 137 137  K 4.5 5.2* 5.2* 5.1 5.5* 4.6 4.4  CL 118* 119* 117* 116* 118* 112* 109  CO2 20*  20* 17* 18* 20* 19* 23  GLUCOSE 90 95 88 90 94 92 100*  BUN 34* 38* 34* 38* 36* 36* 35*  CREATININE 2.91* 3.09* 3.11* 3.58* 3.54* 3.64* 3.53*  CALCIUM 8.1* 8.3* 7.8* 8.0* 8.6* 8.5* 8.6*   CBC Recent Labs  Lab 06/03/22 0606 06/04/22 0820 06/05/22 0501 06/06/22 0619 06/07/22 0539 06/08/22 0520  WBC 15.1*   < > 16.0* 13.2* 12.6* 12.2*  NEUTROABS 11.0*  --   --  9.2* 9.3*  --   HGB 7.4*   < > 8.5*  8.5* 8.4* 8.4* 8.4*  HCT 25.0*   < > 27.8*  27.9* 27.5* 27.2* 27.4*  MCV 98.4   < > 97.9 96.2 96.1 96.1  PLT 309   < > 302 288 285 272   < > = values in this interval not displayed.    Medications:     calcium carbonate  1 tablet Oral TID WC   carvedilol  3.125 mg Oral BID WC   ezetimibe  10 mg Oral Daily   famotidine  10 mg Oral QODAY   feeding supplement  1 Container Oral TID BM   feeding supplement  237 mL Oral BID BM   rosuvastatin  10 mg Oral q morning   sertraline  50 mg Oral q morning   traZODone  150 mg Oral QHS      Otelia Santee, MD 06/08/2022, 12:16 PM

## 2022-06-08 NOTE — Plan of Care (Signed)
  Problem: Education: Goal: Knowledge of General Education information will improve Description: Including pain rating scale, medication(s)/side effects and non-pharmacologic comfort measures Outcome: Progressing   Problem: Health Behavior/Discharge Planning: Goal: Ability to manage health-related needs will improve Outcome: Progressing   Problem: Nutrition: Goal: Adequate nutrition will be maintained Outcome: Progressing   

## 2022-06-08 NOTE — Progress Notes (Signed)
[facility] PROGRESS NOTE   LAUREEN FREDERIC CXK:[id number] DOB: [birth year] DOA: 05/30/2022  PCP: [name], MD  Brief History/Interval Summary: 76 y.o. female with PMH significant for urothelial cancer, HTN, HLD, CAD, CHF, CKD, PE in July 2023, GERD, anxiety depression. Patient has history of urothelial cancer.  Several years ago, she had high-grade urothelial carcinoma of the right distal ureter and underwent right nephro ureterectomy.  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  It was then treated with laser ablation.  In 2022, she was found to have a small volume recurrence in the left calyceal system.  It was treated with focal ablation but also with injection of Jelmyto in the left collecting system through her nephrostomy tube.  She was started on her second round of chemotherapy which was supposed to go for 6 weeks but she was not able to continue that for more than 3 weeks because of significant pain and left ureteral obstruction. On 03/23/2022, she underwent removal of nephrostomy tube and placement of DJ stent on the left.  Subsequently she had UTI as an outpatient twice treated with antibiotics. 7/14 to 7/19, patient was hospitalized with fever, nausea, vomiting, generalized weakness.  She was noted to have sepsis secondary to left-sided pyelonephritis.  Blood culture grew staph hominis and capitis as well as Staph epidermidis.  Per ID recommendation, she was treated with IV vancomycin and was discharged on 9 days of oral Zyvox which she completed post discharge.  Ureteral stent was left in place.  During that hospitalization, patient was also found to have a small right lower lobe pulm embolism and was started on Eliquis following with long-term aspirin.  Patient states that since starting Eliquis, she started to get hematuria. Patient was recently hospitalized 8/7 to 8/8 for low  hemoglobin, FOBT positive and urinalysis with RBCs.  She underwent 2 units of blood transfusion.  [facility] and urologist Dr. Gloriann Loan were consulted.  Hemoglobin stabilized and she was discharged without intervention.  Patient was advised to stop baby aspirin and any other NSAIDs. In the interval, she saw her PCP and was resumed again on Eliquis at half dose of 2.5 mg twice daily and a baby aspirin.  She has started to notice hematuria again. She was in her usual state of health until last night.  Per husband, last night she started to feel warm and had chills.  Around 3 AM this morning, she started having nausea and vomited twice.  She started to get weak and hence EMS was called. EMS noted lobe pressure and give normal saline bolus in route to the ED.   In the ED, patient was afebrile, heart rate in 80s, had a low blood pressure in 80s, breathing on room air Labs with BUN/creatinine elevated to 32/3.17, troponin elevated to 230, lactic acid elevated to 3.7 WBC count elevated to 21.6, hemoglobin low at 9.1, glucose elevated to 120 Urinalysis with cloudy yellow urine with moderate amount of hemoglobin, large amount of leukocytes, positive nitrite, many bacteria EKG in normal sinus rhythm with QTc 459 ms Admitted to hospitalist service   Consultants: Nephrology.  Urology.  Procedures: None yet    Subjective/Interval History: Has been passing clear urine.  No abdominal pain currently.  No nausea or vomiting.     Assessment/Plan:  Urinary tract infection, complicated/sepsis present on admission Presented with nausea vomiting chills leukocytosis lactic acidosis and acute  kidney injury.  Recent hospitalization for pyelonephritis and coagulase-negative staph bacteremia treated with vancomycin and then with linezolid. Started again on broad-spectrum antibiotics.  Urine culture grew 30,000 colonies of Citrobacter.  ID consult was obtained.  Switched over to ciprofloxacin.  She has completed course of  antibiotics.  WBC has improved.  She remains afebrile.  Staphylococcus hominis blood This was noted in 1 out of 4 cultures.  Thought to be a contaminant.  Acute kidney injury on chronic kidney disease stage IIIb Baseline creatinine is 1.5.  Presented with creatinine of 3.17.  Nephrology is following.  Appears to have plateaued.  Renal ultrasound did suggest left hydronephrosis which was seen on previous studies as well.  Discussed with urology.  They are considering replacing her stent tomorrow in case there is a partial obstruction of the stent causing her renal failure.  History of urothelial cancer status post left ureteral stent S/p right distal ureter and underwent right nephro ureterectomy several years ago Recurrence in left ureter in 2020 s/p focal ablation Recurrence in left calyceal system in 2022.  Was getting chemotherapy to nephrostomy tube under the care of Dr. Diona Fanti.  It was stopped because of intolerance and left ureteral obstruction S/p left ureter DJ stent 03/23/2022 Continue [facility] has seen the patient and plans to replace her stent tomorrow.  Hyperkalemia Resolved   Normocytic anemia/chronic blood loss anemia Some of the anemia is likely due to hematuria as well.  Hemoglobin was apparently 12 until May 2023 and has been gradually trending down since then.  Hemoglobin was 6.9 on 8/27 and she was transfused 1 unit of PRBC.  Counts have been stable.  Elevated troponin Likely demand ischemia.  No chest pain.  Hypotension Blood pressures were in the 80s at presentation.  Now in the hypertensive range.  Continue carvedilol.  Avoid tight control due to her kidney injury.  History of congestive heart failure Had systolic CHF based on echo from 2020 when a EF of 40 to 45%.  Based on echo from August of this year EF has improved to 60 to 65%.  Grade 1 diastolic dysfunction was noted. Seems to be fairly euvolemic.  Noted to be on carvedilol which is  continued.  History of coronary artery disease Stable.  Continue home medications.  Acute pulmonary embolism Diagnosed in July 2023.  This was a small PE in the right lower lobe pulmonary artery.  She was started on Eliquis however has not been able to tolerate it due to recurrent hematuria.  Currently both Eliquis and aspirin are on hold.  History of anxiety and depression Continue home medications.  Obesity Estimated body mass index is 31.25 kg/m as calculated from the following:   Height as of this encounter: '5\' 3"'$  (1.6 m).   Weight as of this encounter: 80 kg.   DVT Prophylaxis: SCDs Code Status: Full code Family Communication: Discussed with patient Disposition Plan: Hopefully return home when improved.  Continue to mobilize.  Status is: Inpatient Remains inpatient appropriate because: Acute kidney injury      Medications: Scheduled:  calcium carbonate  1 tablet Oral TID WC   carvedilol  3.125 mg Oral BID WC   ezetimibe  10 mg Oral Daily   famotidine  10 mg Oral QODAY   feeding supplement  1 Container Oral TID BM   feeding supplement  237 mL Oral BID BM   rosuvastatin  10 mg Oral q morning   sertraline  50 mg Oral q morning  traZODone  150 mg Oral QHS   Continuous: OIZ:[id number] **OR** acetaminophen, albuterol, hydrALAZINE, lip balm, ondansetron (ZOFRAN) IV, oxybutynin, polyethylene glycol  Antibiotics: Anti-infectives (From admission, onward)    Start     Dose/Rate Route Frequency Ordered Stop   06/02/22 1100  ciprofloxacin (CIPRO) tablet 250 mg        250 mg Oral Daily 06/02/22 0943 06/07/22 0812   05/31/22 1100  ceFEPIme (MAXIPIME) 2 g in sodium chloride 0.9 % 100 mL IVPB  Status:  Discontinued        2 g 200 mL/hr over 30 Minutes Intravenous Every 24 hours 05/30/22 1518 06/02/22 0943   05/30/22 1517  vancomycin variable dose per unstable renal function (pharmacist dosing)  Status:  Discontinued         Does not apply See admin instructions 05/30/22  1518 05/31/22 1214   05/30/22 1030  ceFEPIme (MAXIPIME) 2 g in sodium chloride 0.9 % 100 mL IVPB        2 g 200 mL/hr over 30 Minutes Intravenous  Once 05/30/22 1024 05/30/22 1125   05/30/22 1030  vancomycin (VANCOREADY) IVPB 1500 mg/300 mL        1,500 mg 150 mL/hr over 120 Minutes Intravenous  Once 05/30/22 1024 05/30/22 1242       Objective:  Vital Signs  Vitals:   06/07/22 0500 06/07/22 1544 06/07/22 2028 06/08/22 0408  BP:  (!) 159/71 (!) 174/69 (!) 158/73  Pulse:  78 80 76  Resp:  '20 17 18  '$ Temp:  99.5 F (37.5 C) 100.1 F (37.8 C) 98.1 F (36.7 C)  TempSrc:  Oral    SpO2:  96% 93% 94%  Weight: 80.5 kg   80 kg  Height:        Intake/Output Summary (Last 24 hours) at 06/08/2022 1045 Last data filed at 06/08/2022 1010 Gross per 24 hour  Intake 1166 ml  Output 750 ml  Net 416 ml    Filed Weights   06/06/22 0448 06/07/22 0500 06/08/22 0408  Weight: 82.3 kg 80.5 kg 80 kg    General appearance: Awake alert.  In no distress Resp: Clear to auscultation bilaterally.  Normal effort Cardio: S1-S2 is normal regular.  No S3-S4.  No rubs murmurs or bruit GI: Abdomen is soft.  Nontender nondistended.  Bowel sounds are present normal.  No masses organomegaly Extremities: No edema.  Full range of motion of lower extremities. Neurologic: Alert and oriented x3.  No focal neurological deficits.   Lab Results:  Data Reviewed: I have personally reviewed following labs and reports of the imaging studies  CBC: Recent Labs  Lab 06/03/22 0606 06/04/22 0820 06/05/22 0501 06/06/22 0619 06/07/22 0539 06/08/22 0520  WBC 15.1* 14.7* 16.0* 13.2* 12.6* 12.2*  NEUTROABS 11.0*  --   --  9.2* 9.3*  --   HGB 7.4* 6.9* 8.5*  8.5* 8.4* 8.4* 8.4*  HCT 25.0* 23.4* 27.8*  27.9* 27.5* 27.2* 27.4*  MCV 98.4 97.9 97.9 96.2 96.1 96.1  PLT 309 286 302 288 285 272     Basic Metabolic Panel: Recent Labs  Lab 06/04/22 0820 06/05/22 0500 06/06/22 0619 06/07/22 0539 06/08/22 0520   NA 138 139 142 137 137  K 5.2* 5.1 5.5* 4.6 4.4  CL 117* 116* 118* 112* 109  CO2 17* 18* 20* 19* 23  GLUCOSE 88 90 94 92 100*  BUN 34* 38* 36* 36* 35*  CREATININE 3.11* 3.58* 3.54* 3.64* 3.53*  CALCIUM 7.8* 8.0* 8.6* 8.5* 8.6*  GFR: Estimated Creatinine Clearance: 14.2 mL/min (A) (by C-G formula based on SCr of 3.53 mg/dL (H)).    Recent Results (from the past 240 hour(s))  Culture, blood (routine x 2)     Status: None   Collection Time: 05/30/22 10:00 AM   Specimen: BLOOD  Result Value Ref Range Status   Specimen Description   Final    BLOOD LEFT ANTECUBITAL Performed at [facility] [address], [place], [place] [address]    Special Requests   Final    BOTTLES DRAWN AEROBIC AND ANAEROBIC Blood Culture adequate volume Performed at [facility] [address], [place], [place] [address]    Culture   Final    NO GROWTH 5 DAYS Performed at [facility], [place] [address], [place], [place] [address]    Report Status 06/04/2022 FINAL  Final  Culture, blood (routine x 2)     Status: Abnormal   Collection Time: 05/30/22 10:15 AM   Specimen: BLOOD  Result Value Ref Range Status   Specimen Description   Final    BLOOD RIGHT ANTECUBITAL Performed at [facility] [address] [place], [place] [address]    Special Requests   Final    BOTTLES DRAWN AEROBIC AND ANAEROBIC Blood Culture adequate volume Performed at [facility] [address] [place], [place] [address]    Culture  Setup Time   Final    GRAM POSITIVE COCCI IN CLUSTERS ANAEROBIC BOTTLE ONLY CRITICAL RESULT CALLED TO, READ BACK BY AND VERIFIED WITH: PHARMD M [place] [id number] AT 82 BY CM    Culture (A)  Final    STAPHYLOCOCCUS HOMINIS THE SIGNIFICANCE OF ISOLATING THIS ORGANISM FROM A SINGLE SET OF BLOOD CULTURES WHEN MULTIPLE SETS ARE DRAWN IS UNCERTAIN. PLEASE NOTIFY THE MICROBIOLOGY DEPARTMENT WITHIN ONE WEEK IF  SPECIATION AND SENSITIVITIES ARE REQUIRED. Performed at [facility], [place] [address] [place], [place] [address]    Report Status 06/02/2022 FINAL  Final  Blood Culture ID Panel (Reflexed)     Status: Abnormal   Collection Time: 05/30/22 10:15 AM  Result Value Ref Range Status   Enterococcus faecalis NOT DETECTED NOT DETECTED Final   Enterococcus Faecium NOT DETECTED NOT DETECTED Final   Listeria monocytogenes NOT DETECTED NOT DETECTED Final   Staphylococcus species DETECTED (A) NOT DETECTED Final    Comment: CRITICAL RESULT CALLED TO, READ BACK BY AND VERIFIED WITH: PHARMD M [place] [id number] AT 1158 BY CM    Staphylococcus aureus (BCID) NOT DETECTED NOT DETECTED Final   Staphylococcus epidermidis NOT DETECTED NOT DETECTED Final   Staphylococcus lugdunensis NOT DETECTED NOT DETECTED Final   Streptococcus species NOT DETECTED NOT DETECTED Final   Streptococcus agalactiae NOT DETECTED NOT DETECTED Final   Streptococcus pneumoniae NOT DETECTED NOT DETECTED Final   Streptococcus pyogenes NOT DETECTED NOT DETECTED Final   A.calcoaceticus-baumannii NOT DETECTED NOT DETECTED Final   Bacteroides fragilis NOT DETECTED NOT DETECTED Final   Enterobacterales NOT DETECTED NOT DETECTED Final   Enterobacter cloacae complex NOT DETECTED NOT DETECTED Final   Escherichia coli NOT DETECTED NOT DETECTED Final   Klebsiella aerogenes NOT DETECTED NOT DETECTED Final   Klebsiella oxytoca NOT DETECTED NOT DETECTED Final   Klebsiella pneumoniae NOT DETECTED NOT DETECTED Final   Proteus species NOT DETECTED NOT DETECTED Final   Salmonella species NOT DETECTED NOT DETECTED Final   Serratia marcescens NOT DETECTED NOT DETECTED Final   Haemophilus influenzae NOT DETECTED NOT  DETECTED Final   Neisseria meningitidis NOT DETECTED NOT DETECTED Final   Pseudomonas aeruginosa NOT DETECTED NOT DETECTED Final   Stenotrophomonas maltophilia NOT DETECTED NOT DETECTED Final   Candida albicans NOT DETECTED NOT DETECTED  Final   Candida auris NOT DETECTED NOT DETECTED Final   Candida glabrata NOT DETECTED NOT DETECTED Final   Candida krusei NOT DETECTED NOT DETECTED Final   Candida parapsilosis NOT DETECTED NOT DETECTED Final   Candida tropicalis NOT DETECTED NOT DETECTED Final   Cryptococcus neoformans/gattii NOT DETECTED NOT DETECTED Final    Comment: Performed at [facility], [place] [address] [place], [place] [address]  Resp Panel by RT-PCR (Flu A&B, Covid) Anterior Nasal Swab     Status: None   Collection Time: 05/30/22 10:16 AM   Specimen: Anterior Nasal Swab  Result Value Ref Range Status   SARS Coronavirus 2 by RT PCR NEGATIVE NEGATIVE Final    Comment: (NOTE) SARS-CoV-2 target nucleic acids are NOT DETECTED.  The SARS-CoV-2 RNA is generally detectable in upper respiratory specimens during the acute phase of infection. The lowest concentration of SARS-CoV-2 viral copies this assay can detect is 138 copies/mL. A negative result does not preclude SARS-Cov-2 infection and should not be used as the sole basis for treatment or other patient management decisions. A negative result may occur with  improper specimen collection/handling, submission of specimen other than nasopharyngeal swab, presence of viral mutation(s) within the areas targeted by this assay, and inadequate number of viral copies(<138 copies/mL). A negative result must be combined with clinical observations, patient history, and epidemiological information. The expected result is Negative.  Fact Sheet for Patients:  EntrepreneurPulse.com.au  Fact Sheet for Healthcare Providers:  IncredibleEmployment.be  This test is no t yet approved or cleared by the [place] FDA and  has been authorized for detection and/or diagnosis of SARS-CoV-2 by FDA under an Emergency Use Authorization (EUA). This EUA will remain  in effect (meaning this test can be used) for the duration of the COVID-19  declaration under Section 564(b)(1) of the Act, 21 U.S.C.section 360bbb-3(b)(1), unless the authorization is terminated  or revoked sooner.       Influenza A by PCR NEGATIVE NEGATIVE Final   Influenza B by PCR NEGATIVE NEGATIVE Final    Comment: (NOTE) The Xpert Xpress SARS-CoV-2/FLU/RSV plus assay is intended as an aid in the diagnosis of influenza from Nasopharyngeal swab specimens and should not be used as a sole basis for treatment. Nasal washings and aspirates are unacceptable for Xpert Xpress SARS-CoV-2/FLU/RSV testing.  Fact Sheet for Patients: EntrepreneurPulse.com.au  Fact Sheet for Healthcare Providers: IncredibleEmployment.be  This test is not yet approved or cleared by the [place] FDA and has been authorized for detection and/or diagnosis of SARS-CoV-2 by FDA under an Emergency Use Authorization (EUA). This EUA will remain in effect (meaning this test can be used) for the duration of the COVID-19 declaration under Section 564(b)(1) of the Act, 21 U.S.C. section 360bbb-3(b)(1), unless the authorization is terminated or revoked.  Performed at [facility], [place] [address], [place], [place] [address]   Urine Culture     Status: Abnormal   Collection Time: 05/30/22  3:49 PM   Specimen: Urine, Clean Catch  Result Value Ref Range Status   Specimen Description   Final    URINE, CLEAN CATCH Performed at [facility], [place] [address], [place], [place] [address]    Special Requests   Final    NONE Performed at  [facility], [place] [address] [place], [place] [address]    Culture 30,000 COLONIES/mL CITROBACTER FREUNDII (A)  Final   Report Status 06/01/2022 FINAL  Final   Organism ID, Bacteria CITROBACTER FREUNDII (A)  Final      Susceptibility   Citrobacter freundii - MIC*    CEFAZOLIN >=64 RESISTANT Resistant     CEFEPIME 0.25 SENSITIVE Sensitive     CEFTRIAXONE >=64  RESISTANT Resistant     CIPROFLOXACIN <=0.25 SENSITIVE Sensitive     GENTAMICIN <=1 SENSITIVE Sensitive     IMIPENEM <=0.25 SENSITIVE Sensitive     NITROFURANTOIN <=16 SENSITIVE Sensitive     TRIMETH/SULFA <=20 SENSITIVE Sensitive     PIP/TAZO 32 INTERMEDIATE Intermediate     * 30,000 COLONIES/mL CITROBACTER FREUNDII  Culture, blood (Routine X 2) w Reflex to ID Panel     Status: None   Collection Time: 06/01/22 11:56 AM   Specimen: BLOOD  Result Value Ref Range Status   Specimen Description   Final    BLOOD BLOOD RIGHT ARM Performed at [facility] [address], [place], [place] [address]    Special Requests   Final    BOTTLES DRAWN AEROBIC AND ANAEROBIC Blood Culture adequate volume Performed at [facility] [address], [place], [place] [address]    Culture   Final    NO GROWTH 5 DAYS Performed at [facility], [place] [address], [place], [place] [address]    Report Status 06/06/2022 FINAL  Final  Culture, blood (Routine X 2) w Reflex to ID Panel     Status: None   Collection Time: 06/01/22 11:57 AM   Specimen: BLOOD  Result Value Ref Range Status   Specimen Description   Final    BLOOD BLOOD LEFT HAND Performed at [facility] [address] [place], [place] [address]    Special Requests   Final    BOTTLES DRAWN AEROBIC AND ANAEROBIC Blood Culture adequate volume Performed at [facility] [address] [place], [place] [address]    Culture   Final    NO GROWTH 5 DAYS Performed at [facility], [place] [address] [place], [place] [address]    Report Status 06/06/2022 FINAL  Final      Radiology Studies: No results found.     LOS: 9 days   Maxi Carreras [employer] on [email or web address]  06/08/2022, 10:45 AM

## 2022-06-08 NOTE — Progress Notes (Signed)
Mobility Specialist - Progress Note   06/08/22 1251  Mobility  Activity Ambulated with assistance in hallway  Level of Assistance Contact guard assist, steadying assist  Assistive Device Front wheel walker  Distance Ambulated (ft) 1000 ft  Activity Response Tolerated well  $Mobility charge 1 Mobility   Pt received in bed and agreed for mobility. No c/o pain nor dizziness. One moment of unsteadiness during ambulation when turning. Pt returned to bed with all needs met, family in room and call bell within reach.   Roderick Pee Mobility Specialist

## 2022-06-08 NOTE — Progress Notes (Signed)
Mobility Specialist - Progress Note   06/08/22 0900  Mobility  Activity Ambulated with assistance in hallway  Level of Assistance Modified independent, requires aide device or extra time  Assistive Device Front wheel walker  Distance Ambulated (ft) 750 ft  Activity Response Tolerated well  $Mobility charge 1 Mobility   Pt received in bed and agreed for mobility. No c/o pain nor dizziness. Pt left in bed with all needs met and call bell within reach.   Roderick Pee Mobility Specialist

## 2022-06-09 ENCOUNTER — Inpatient Hospital Stay (HOSPITAL_COMMUNITY): Payer: Medicare PPO | Admitting: Certified Registered Nurse Anesthetist

## 2022-06-09 ENCOUNTER — Encounter (HOSPITAL_COMMUNITY)
Admission: EM | Disposition: A | Discharge status: Home / Self Care | Payer: Self-pay | Source: Home / Self Care | Attending: Internal Medicine

## 2022-06-09 ENCOUNTER — Inpatient Hospital Stay (HOSPITAL_COMMUNITY): Payer: Medicare PPO

## 2022-06-09 ENCOUNTER — Encounter (HOSPITAL_COMMUNITY): Payer: Self-pay | Admitting: Internal Medicine

## 2022-06-09 DIAGNOSIS — I130 Hypertensive heart and chronic kidney disease with heart failure and stage 1 through stage 4 chronic kidney disease, or unspecified chronic kidney disease: Secondary | ICD-10-CM | POA: Diagnosis not present

## 2022-06-09 DIAGNOSIS — I2510 Atherosclerotic heart disease of native coronary artery without angina pectoris: Secondary | ICD-10-CM

## 2022-06-09 DIAGNOSIS — I509 Heart failure, unspecified: Secondary | ICD-10-CM

## 2022-06-09 DIAGNOSIS — D649 Anemia, unspecified: Secondary | ICD-10-CM | POA: Diagnosis not present

## 2022-06-09 DIAGNOSIS — N189 Chronic kidney disease, unspecified: Secondary | ICD-10-CM

## 2022-06-09 DIAGNOSIS — D631 Anemia in chronic kidney disease: Secondary | ICD-10-CM

## 2022-06-09 DIAGNOSIS — N2889 Other specified disorders of kidney and ureter: Secondary | ICD-10-CM

## 2022-06-09 DIAGNOSIS — C689 Malignant neoplasm of urinary organ, unspecified: Secondary | ICD-10-CM | POA: Diagnosis not present

## 2022-06-09 DIAGNOSIS — N179 Acute kidney failure, unspecified: Secondary | ICD-10-CM | POA: Diagnosis not present

## 2022-06-09 DIAGNOSIS — Z87891 Personal history of nicotine dependence: Secondary | ICD-10-CM

## 2022-06-09 HISTORY — PX: CYSTOSCOPY W/ URETERAL STENT PLACEMENT: SHX1429

## 2022-06-09 LAB — CBC
HCT: 29 % — ABNORMAL LOW (ref 36.0–46.0)
Hemoglobin: 8.8 g/dL — ABNORMAL LOW (ref 12.0–15.0)
MCH: 29.1 pg (ref 26.0–34.0)
MCHC: 30.3 g/dL (ref 30.0–36.0)
MCV: 96 fL (ref 80.0–100.0)
Platelets: 290 10*3/uL (ref 150–400)
RBC: 3.02 MIL/uL — ABNORMAL LOW (ref 3.87–5.11)
RDW: 17.4 % — ABNORMAL HIGH (ref 11.5–15.5)
WBC: 11.8 10*3/uL — ABNORMAL HIGH (ref 4.0–10.5)
nRBC: 0 % (ref 0.0–0.2)

## 2022-06-09 LAB — BASIC METABOLIC PANEL
Anion gap: 6 (ref 5–15)
BUN: 32 mg/dL — ABNORMAL HIGH (ref 8–23)
CO2: 23 mmol/L (ref 22–32)
Calcium: 8.5 mg/dL — ABNORMAL LOW (ref 8.9–10.3)
Chloride: 110 mmol/L (ref 98–111)
Creatinine, Ser: 3.37 mg/dL — ABNORMAL HIGH (ref 0.44–1.00)
GFR, Estimated: 14 mL/min — ABNORMAL LOW (ref >60)
Glucose, Bld: 98 mg/dL (ref 70–99)
Potassium: 4.3 mmol/L (ref 3.5–5.1)
Sodium: 139 mmol/L (ref 135–145)

## 2022-06-09 SURGERY — CYSTOSCOPY, FLEXIBLE, WITH STENT REPLACEMENT
Anesthesia: General | Site: Ureter | Laterality: Left

## 2022-06-09 MED ORDER — LIDOCAINE HCL (CARDIAC) PF 100 MG/5ML IV SOSY
PREFILLED_SYRINGE | INTRAVENOUS | Status: DC | PRN
  Administered 2022-06-09: 100 mg via INTRAVENOUS

## 2022-06-09 MED ORDER — PROPOFOL 10 MG/ML IV BOLUS
INTRAVENOUS | Status: DC | PRN
  Administered 2022-06-09: 160 mg via INTRAVENOUS

## 2022-06-09 MED ORDER — FENTANYL CITRATE PF 50 MCG/ML IJ SOSY: 25.0000 ug | PREFILLED_SYRINGE | INTRAMUSCULAR | Status: DC | PRN

## 2022-06-09 MED ORDER — POLYETHYLENE GLYCOL 3350 17 G PO PACK
17.0000 g | PACK | Freq: Two times a day (BID) | ORAL | Status: DC
  Administered 2022-06-09 – 2022-06-11 (×4): 17 g via ORAL
  Filled 2022-06-09 (×5): qty 1

## 2022-06-09 MED ORDER — STERILE WATER FOR IRRIGATION IR SOLN
Status: DC | PRN
  Administered 2022-06-09: 3000 mL

## 2022-06-09 MED ORDER — ACETAMINOPHEN 10 MG/ML IV SOLN: 1000.0000 mg | Freq: Once | INTRAVENOUS | Status: DC | PRN

## 2022-06-09 MED ORDER — PROPOFOL 10 MG/ML IV BOLUS
INTRAVENOUS | Status: AC
Start: 2022-06-09 — End: ?
  Filled 2022-06-09: qty 20

## 2022-06-09 MED ORDER — LACTATED RINGERS IV SOLN: INTRAVENOUS | Status: DC

## 2022-06-09 MED ORDER — ONDANSETRON HCL 4 MG/2ML IJ SOLN
INTRAMUSCULAR | Status: DC | PRN
  Administered 2022-06-09: 4 mg via INTRAVENOUS

## 2022-06-09 MED ORDER — DEXAMETHASONE SODIUM PHOSPHATE 10 MG/ML IJ SOLN
INTRAMUSCULAR | Status: DC | PRN
  Administered 2022-06-09: 4 mg via INTRAVENOUS

## 2022-06-09 MED ORDER — ONDANSETRON HCL 4 MG/2ML IJ SOLN
4.0000 mg | Freq: Once | INTRAMUSCULAR | Status: DC | PRN
Start: 2022-06-09 — End: 2022-06-09

## 2022-06-09 MED ORDER — CHLORHEXIDINE GLUCONATE 0.12 % MT SOLN
15.0000 mL | Freq: Once | OROMUCOSAL | Status: AC
  Administered 2022-06-09: 15 mL via OROMUCOSAL

## 2022-06-09 MED ORDER — SENNOSIDES-DOCUSATE SODIUM 8.6-50 MG PO TABS
2.0000 | ORAL_TABLET | Freq: Every day | ORAL | Status: DC
  Administered 2022-06-09 – 2022-06-10 (×2): 2 via ORAL
  Filled 2022-06-09 (×2): qty 2

## 2022-06-09 MED ORDER — ONDANSETRON HCL 4 MG/2ML IJ SOLN
INTRAMUSCULAR | Status: AC
Start: 2022-06-09 — End: ?
  Filled 2022-06-09: qty 2

## 2022-06-09 MED ORDER — FENTANYL CITRATE (PF) 100 MCG/2ML IJ SOLN
INTRAMUSCULAR | Status: AC
  Filled 2022-06-09: qty 2

## 2022-06-09 MED ORDER — DEXAMETHASONE SODIUM PHOSPHATE 10 MG/ML IJ SOLN
INTRAMUSCULAR | Status: AC
Start: 2022-06-09 — End: ?
  Filled 2022-06-09: qty 1

## 2022-06-09 MED ORDER — FENTANYL CITRATE (PF) 100 MCG/2ML IJ SOLN
INTRAMUSCULAR | Status: DC | PRN
  Administered 2022-06-09: 50 ug via INTRAVENOUS

## 2022-06-09 MED ORDER — IOHEXOL 300 MG/ML  SOLN
INTRAMUSCULAR | Status: DC | PRN
  Administered 2022-06-09: 15 mL

## 2022-06-09 SURGICAL SUPPLY — 15 items
BAG COUNTER SPONGE SURGICOUNT (BAG) IMPLANT
BAG URO CATCHER STRL LF (MISCELLANEOUS) ×1 IMPLANT
CATH URETL OPEN END 6FR 70 (CATHETERS) IMPLANT
CLOTH BEACON ORANGE TIMEOUT ST (SAFETY) ×1 IMPLANT
GLOVE SURG LX 8.0 MICRO (GLOVE) ×1
GLOVE SURG LX STRL 8.0 MICRO (GLOVE) ×1 IMPLANT
GOWN STRL REUS W/ TWL XL LVL3 (GOWN DISPOSABLE) ×1 IMPLANT
GOWN STRL REUS W/TWL XL LVL3 (GOWN DISPOSABLE) ×1
GUIDEWIRE ANG ZIPWIRE 038X150 (WIRE) IMPLANT
GUIDEWIRE STR DUAL SENSOR (WIRE) ×1 IMPLANT
KIT TURNOVER KIT A (KITS) IMPLANT
MANIFOLD NEPTUNE II (INSTRUMENTS) ×1 IMPLANT
PACK CYSTO (CUSTOM PROCEDURE TRAY) ×1 IMPLANT
STENT CONTOUR 7FRX24X.038 (STENTS) IMPLANT
TUBING CONNECTING 10 (TUBING) ×1 IMPLANT

## 2022-06-09 NOTE — Anesthesia Postprocedure Evaluation (Signed)
Anesthesia Post Note  Patient: [name]  Procedure(s) Performed: CYSTOSCOPY WITH LEFT STENT EXCHANGE (Left: Ureter)     Patient location during evaluation: PACU Anesthesia Type: General Level of consciousness: awake and alert Pain management: pain level controlled Vital Signs Assessment: post-procedure vital signs reviewed and stable Respiratory status: spontaneous breathing, nonlabored ventilation, respiratory function stable and patient connected to nasal cannula oxygen Cardiovascular status: blood pressure returned to baseline and stable Postop Assessment: no apparent nausea or vomiting Anesthetic complications: no   No notable events documented.  Last Vitals:  Vitals:   06/09/22 1330 06/09/22 1344  BP: (!) 156/72 (!) 153/73  Pulse: 72 72  Resp: 18 (!) 22  Temp:  37 C  SpO2: 95% 92%    Last Pain:  Vitals:   06/09/22 1344  TempSrc: Oral  PainSc:                  Barnet [place]

## 2022-06-09 NOTE — Care Management Important Message (Signed)
Important Message  Patient Details IM Letter given to the Patient. Name: [name] MRN: [id number] Date of Birth: 1946/02/19   Medicare Important Message Given:  Yes     Kerin Salen 06/09/2022, 11:18 AM

## 2022-06-09 NOTE — Transfer of Care (Signed)
Immediate Anesthesia Transfer of Care Note  Patient: [name]  Procedure(s) Performed: CYSTOSCOPY WITH LEFT STENT EXCHANGE (Left: Ureter)  Patient Location: PACU  Anesthesia Type:General  Level of Consciousness: awake, alert  and patient cooperative  Airway & Oxygen Therapy: Patient Spontanous Breathing and Patient connected to nasal cannula oxygen  Post-op Assessment: Report given to RN, Post -op Vital signs reviewed and stable and Patient moving all extremities X 4  Post vital signs: Reviewed and stable  Last Vitals:  Vitals Value Taken Time  BP 130/56 06/09/22 1303  Temp    Pulse 70 06/09/22 1304  Resp 18 06/09/22 1304  SpO2 90 % 06/09/22 1304  Vitals shown include unvalidated device data.  Last Pain:  Vitals:   06/09/22 1140  TempSrc: Oral  PainSc:       Patients Stated Pain Goal: 0 (47/34/03 7096)  Complications: No notable events documented.

## 2022-06-09 NOTE — Progress Notes (Signed)
Patient arrived post procedure via stretcher back to unit. Unit telemetry box placed on patient

## 2022-06-09 NOTE — Progress Notes (Signed)
[facility] Progress Note   76 y.o. female HTN, HLD, CASHD, dCHF, PE 04/2022, right nephro ureterectomy 2006 for high grade urothelial carcinoma, left sided urothelial cancer (renal pelvis and ureter) discovered 05/2019 treated with laser ablation. Then recurrence on left side in 2022 ltx initially with Jelmyto d/c bec of obstruction. S/p  double J stent + nephrostogram through left nephrostomy tube + removal of nephrostomy tube on 03/23/2022. Patient recently admitted  7/14-7/19 with Staph hominis bacteremia tx with Vancomycin transitioning to linezolid. Patient also with cictrobacter freundii + urine cultures. Patient was also on Eliquis for a PE discovered during that hospitalization. Patient now presenting with nausea, vomiting, anorexia, fatigue found to be afebrile in the ED but SBP was in the 80's. BUN/Cr was also 35/2.57  with a WBC of 21.6. Cr was in the 1.3-1.5 range in 04/21/22 - 05/16/22. Ultrasound showed normal cortical thickness, slightly increased cortical echogenicity, mild LEFT hydronephrosis.  Assessment/ Plan:   Acute kidney injury on CKD 3 w/ a baseline cr 1.3-1.5. Unclear etiology of AKI as by history and PE the patient does not appear +10.5 L. May have ATN from the UTI vs obstructive uropathy. I would think it's too early for AIN and her renal function was already worsening immediately upon admission. Usually if there is a question of cause of AKI or to r/o AIN I would biopsy but a solitary kidney is a contraindication. - Strict I&O's, daily weights (stable at 80kg) - Renal ultrasound only shows mild left sided hydro but this is in the context of a solitary kidney. CXR does not show congestion. - Did not see pigmented casts but some normomorphic RBC, florid WBC's c/w UTI.  - UOP is in the 500-850/24hr range for the past few days and cr starting to improve. AKI likely associated with the UTI or it may be mild ATN.  - Vancomycin trough level only 5   Appreciate Dr.  Diona Fanti seeing the patient; changing out the stent 9/1. Mild hydronephrosis is not the cause of the AKI but have to wonder if an ongoing infection is contributing.   If she is nearing d/c CKA can follow her up in clinic in 2 weeks to see where her renal function settles in at. She's starting to feel better and appetite a little improved.  -Maintain MAP>65 for optimal renal perfusion; actually hypertensive at this point.  - Avoid nephrotoxic agents such as IV contrast, NSAIDs, and phosphate containing bowel preps (FLEETS)   UTI growing Citrobacter currently on ciprofloxacin. Could the stent be the source of the infection? J stent is still present. Aneimia - unable to give IV iron w active infection. Transfuse if symptomatic or if Hb drops below 7.  Initially hypotenstive but now hypertensive being appropriately treated. H/o PE - but Eliquis and ASA on hold for hematuria Urothelial cancer  Subjective:   Feeling better, no nausea and appetite starting to slowly improve.    Objective:   BP (!) 144/67 (BP Location: Right Arm)   Pulse 71   Temp 98.3 F (36.8 C)   Resp 17   Ht '5\' 3"'$  (1.6 m)   Wt 79.1 kg   SpO2 96%   BMI 30.89 kg/m   Intake/Output Summary (Last 24 hours) at 06/09/2022 1343 Last data filed at 06/09/2022 1250 Gross per 24 hour  Intake 360 ml  Output 605 ml  Net -245 ml   Weight change: -0.915 kg  Physical Exam: General appearance: alert, cooperative Head: NCAT Neck: no adenopathy Back:  symmetric, no curvature. No CVA tenderness. Resp: rales bibasilar and bilaterally (dry rales) Cardio: regular rate and rhythm GI: soft, non-tender; bowel sounds normal Extremities: edema tr Pulses: 2+ and symmetric Skin: No rashes or lesions  Imaging: DG C-Arm 1-60 Min-No Report  Result Date: 06/09/2022 Fluoroscopy was utilized by the requesting physician.  No radiographic interpretation.    Labs: BMET Recent Labs  Lab 06/03/22 0606 06/04/22 0820 06/05/22 0500  06/06/22 0619 06/07/22 0539 06/08/22 0520 06/09/22 0608  NA 144 138 139 142 137 137 139  K 5.2* 5.2* 5.1 5.5* 4.6 4.4 4.3  CL 119* 117* 116* 118* 112* 109 110  CO2 20* 17* 18* 20* 19* 23 23  GLUCOSE 95 88 90 94 92 100* 98  BUN 38* 34* 38* 36* 36* 35* 32*  CREATININE 3.09* 3.11* 3.58* 3.54* 3.64* 3.53* 3.37*  CALCIUM 8.3* 7.8* 8.0* 8.6* 8.5* 8.6* 8.5*   CBC Recent Labs  Lab 06/03/22 0606 06/04/22 0820 06/06/22 0619 06/07/22 0539 06/08/22 0520 06/09/22 0608  WBC 15.1*   < > 13.2* 12.6* 12.2* 11.8*  NEUTROABS 11.0*  --  9.2* 9.3*  --   --   HGB 7.4*   < > 8.4* 8.4* 8.4* 8.8*  HCT 25.0*   < > 27.5* 27.2* 27.4* 29.0*  MCV 98.4   < > 96.2 96.1 96.1 96.0  PLT 309   < > 288 285 272 290   < > = values in this interval not displayed.    Medications:     [MAR Hold] calcium carbonate  1 tablet Oral TID WC   [MAR Hold] carvedilol  3.125 mg Oral BID WC   [MAR Hold] ezetimibe  10 mg Oral Daily   [MAR Hold] famotidine  10 mg Oral QODAY   [MAR Hold] feeding supplement  1 Container Oral TID BM   [MAR Hold] feeding supplement  237 mL Oral BID BM   [MAR Hold] polyethylene glycol  17 g Oral BID   [MAR Hold] rosuvastatin  10 mg Oral q morning   [MAR Hold] senna-docusate  2 tablet Oral QHS   [MAR Hold] sertraline  50 mg Oral q morning   [MAR Hold] traZODone  150 mg Oral QHS      Otelia Santee, MD 06/09/2022, 1:43 PM

## 2022-06-09 NOTE — Anesthesia Procedure Notes (Signed)
Procedure Name: LMA Insertion Date/Time: 06/09/2022 12:36 PM  Performed by: Jonna Munro, CRNAPre-anesthesia Checklist: Patient identified, Emergency Drugs available, Suction available, Patient being monitored and Timeout performed Patient Re-evaluated:Patient Re-evaluated prior to induction Oxygen Delivery Method: Circle system utilized Preoxygenation: Pre-oxygenation with 100% oxygen Induction Type: IV induction LMA: LMA inserted and LMA with gastric port inserted LMA Size: 3.0 Number of attempts: 1 Placement Confirmation: positive ETCO2, CO2 detector and breath sounds checked- equal and bilateral Tube secured with: Tape Dental Injury: Teeth and Oropharynx as per pre-operative assessment

## 2022-06-09 NOTE — Progress Notes (Signed)
[facility] PROGRESS NOTE   [name] AST:[id number] DOB: 08-Mar-1946 DOA: 05/30/2022  PCP: [name], MD  Brief History/Interval Summary: 76 y.o. female with PMH significant for urothelial cancer, HTN, HLD, CAD, CHF, CKD, PE in July 2023, GERD, anxiety depression. Patient has history of urothelial cancer.  Several years ago, she had high-grade urothelial carcinoma of the right distal ureter and underwent right nephro ureterectomy.  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  It was then treated with laser ablation.  In 2022, she was found to have a small volume recurrence in the left calyceal system.  It was treated with focal ablation but also with injection of Jelmyto in the left collecting system through her nephrostomy tube.  She was started on her second round of chemotherapy which was supposed to go for 6 weeks but she was not able to continue that for more than 3 weeks because of significant pain and left ureteral obstruction. On 03/23/2022, she underwent removal of nephrostomy tube and placement of DJ stent on the left.  Subsequently she had UTI as an outpatient twice treated with antibiotics. 7/14 to 7/19, patient was hospitalized with fever, nausea, vomiting, generalized weakness.  She was noted to have sepsis secondary to left-sided pyelonephritis.  Blood culture grew staph hominis and capitis as well as Staph epidermidis.  Per ID recommendation, she was treated with IV vancomycin and was discharged on 9 days of oral Zyvox which she completed post discharge.  Ureteral stent was left in place.  During that hospitalization, patient was also found to have a small right lower lobe pulm embolism and was started on Eliquis following with long-term aspirin.  Patient states that since starting Eliquis, she started to get hematuria. Patient was recently hospitalized 8/7 to 8/8 for low  hemoglobin, FOBT positive and urinalysis with RBCs.  She underwent 2 units of blood transfusion.  [facility] and urologist Dr. Gloriann Loan were consulted.  Hemoglobin stabilized and she was discharged without intervention.  Patient was advised to stop baby aspirin and any other NSAIDs. In the interval, she saw her PCP and was resumed again on Eliquis at half dose of 2.5 mg twice daily and a baby aspirin.  She has started to notice hematuria again. She was in her usual state of health until last night.  Per husband, last night she started to feel warm and had chills.  Around 3 AM this morning, she started having nausea and vomited twice.  She started to get weak and hence EMS was called. EMS noted lobe pressure and give normal saline bolus in route to the ED. In the ED, patient was afebrile, heart rate in 80s, had a low blood pressure in 80s, breathing on room air Labs with BUN/creatinine elevated to 32/3.17, troponin elevated to 230, lactic acid elevated to 3.7 WBC count elevated to 21.6, hemoglobin low at 9.1, glucose elevated to 120 Urinalysis with cloudy yellow urine with moderate amount of hemoglobin, large amount of leukocytes, positive nitrite, many bacteria EKG in normal sinus rhythm with QTc 459 ms Admitted to hospitalist service   Consultants: Nephrology.  Urology.  Procedures: Plan is for ureteral stent replacement today    Subjective/Interval History: Denies any complaints this morning.  Has been passing urine.  No nausea vomiting.  No shortness of breath.    Assessment/Plan:  Acute kidney injury on chronic kidney disease stage IIIb Baseline creatinine  is 1.5.  Presented with creatinine of 3.17.  Nephrology is following.  Renal function worsened and appears to have plateaued over the last few days.  She has been making urine. Renal ultrasound did suggest left hydronephrosis which was seen on previous studies as well.  Discussed with urology.  They are considering replacing her stent in case  there is a partial obstruction of the stent causing her renal failure.  Urinary tract infection, complicated/sepsis present on admission Presented with nausea vomiting chills leukocytosis lactic acidosis and acute kidney injury.  Recent hospitalization for pyelonephritis and coagulase-negative staph bacteremia treated with vancomycin and then with linezolid. Started again on broad-spectrum antibiotics.  Urine culture grew 30,000 colonies of Citrobacter.  ID consult was obtained.  Switched over to ciprofloxacin.  She has completed course of antibiotics.  WBC has improved.  She remains afebrile.  Staphylococcus hominis blood This was noted in 1 out of 4 cultures.  Thought to be a contaminant.  History of urothelial cancer status post left ureteral stent S/p right distal ureter and underwent right nephro ureterectomy several years ago Recurrence in left ureter in 2020 s/p focal ablation Recurrence in left calyceal system in 2022.  Was getting chemotherapy to nephrostomy tube under the care of Dr. Diona Fanti.  It was stopped because of intolerance and left ureteral obstruction S/p left ureter DJ stent 03/23/2022 Continue Ditropan Urology has seen the patient and plans to replace her stent today.  Hyperkalemia Resolved   Normocytic anemia/chronic blood loss anemia Some of the anemia is likely due to hematuria as well.  Hemoglobin was apparently 12 until May 2023 and has been gradually trending down since then.  Hemoglobin was 6.9 on 8/27 and she was transfused 1 unit of PRBC.  Hemoglobin stable for the last several days.  Elevated troponin Likely demand ischemia.  No chest pain.  Hypotension Blood pressures were in the 80s at presentation.  Now in the hypertensive range.  Continue carvedilol.  Avoid tight control due to her kidney injury.  History of congestive heart failure Had systolic CHF based on echo from 2020 when a EF of 40 to 45%.  Based on echo from August of this year EF has improved  to 60 to 65%.  Grade 1 diastolic dysfunction was noted. Seems to be fairly euvolemic.  Noted to be on carvedilol which is continued.  History of coronary artery disease Stable.  Continue home medications.  Acute pulmonary embolism Diagnosed in July 2023.  This was a small PE in the right lower lobe pulmonary artery.  She was started on Eliquis however has not been able to tolerate it due to recurrent hematuria.  Currently both Eliquis and aspirin are on hold.  History of anxiety and depression Continue home medications.  Obesity Estimated body mass index is 30.89 kg/m as calculated from the following:   Height as of this encounter: '5\' 3"'$  (1.6 m).   Weight as of this encounter: 79.1 kg.   DVT Prophylaxis: SCDs Code Status: Full code Family Communication: Discussed with patient Disposition Plan: Hopefully return home when improved.  Continue to mobilize.  Status is: Inpatient Remains inpatient appropriate because: Acute kidney injury      Medications: Scheduled:  calcium carbonate  1 tablet Oral TID WC   carvedilol  3.125 mg Oral BID WC   ezetimibe  10 mg Oral Daily   famotidine  10 mg Oral QODAY   feeding supplement  1 Container Oral TID BM   feeding supplement  237 mL Oral  BID BM   polyethylene glycol  17 g Oral BID   rosuvastatin  10 mg Oral q morning   senna-docusate  2 tablet Oral QHS   sertraline  50 mg Oral q morning   traZODone  150 mg Oral QHS   Continuous: FXJ:OITGPQDIYMEBR **OR** acetaminophen, albuterol, hydrALAZINE, lip balm, ondansetron (ZOFRAN) IV, oxybutynin  Antibiotics: Anti-infectives (From admission, onward)    Start     Dose/Rate Route Frequency Ordered Stop   06/02/22 1100  ciprofloxacin (CIPRO) tablet 250 mg        250 mg Oral Daily 06/02/22 0943 06/07/22 0812   05/31/22 1100  ceFEPIme (MAXIPIME) 2 g in sodium chloride 0.9 % 100 mL IVPB  Status:  Discontinued        2 g 200 mL/hr over 30 Minutes Intravenous Every 24 hours 05/30/22 1518  06/02/22 0943   05/30/22 1517  vancomycin variable dose per unstable renal function (pharmacist dosing)  Status:  Discontinued         Does not apply See admin instructions 05/30/22 1518 05/31/22 1214   05/30/22 1030  ceFEPIme (MAXIPIME) 2 g in sodium chloride 0.9 % 100 mL IVPB        2 g 200 mL/hr over 30 Minutes Intravenous  Once 05/30/22 1024 05/30/22 1125   05/30/22 1030  vancomycin (VANCOREADY) IVPB 1500 mg/300 mL        1,500 mg 150 mL/hr over 120 Minutes Intravenous  Once 05/30/22 1024 05/30/22 1242       Objective:  Vital Signs  Vitals:   06/08/22 0408 06/08/22 1452 06/08/22 2100 06/09/22 0601  BP: (!) 158/73 (!) 151/67 (!) 172/74 (!) 147/72  Pulse: 76 68 74 78  Resp: '18 18 18 17  '$ Temp: 98.1 F (36.7 C) 98.3 F (36.8 C) 99.6 F (37.6 C) 99.1 F (37.3 C)  TempSrc:  Oral  Oral  SpO2: 94% 95% 96% 95%  Weight: 80 kg   79.1 kg  Height:        Intake/Output Summary (Last 24 hours) at 06/09/2022 1059 Last data filed at 06/09/2022 0940 Gross per 24 hour  Intake 60 ml  Output 1100 ml  Net -1040 ml    Filed Weights   06/07/22 0500 06/08/22 0408 06/09/22 0601  Weight: 80.5 kg 80 kg 79.1 kg    General appearance: Awake alert.  In no distress Resp: Clear to auscultation bilaterally.  Normal effort Cardio: S1-S2 is normal regular.  No S3-S4.  No rubs murmurs or bruit GI: Abdomen is soft.  Nontender nondistended.  Bowel sounds are present normal.  No masses organomegaly Extremities: No edema.  Full range of motion of lower extremities. Neurologic: Alert and oriented x3.  No focal neurological deficits.     Lab Results:  Data Reviewed: I have personally reviewed following labs and reports of the imaging studies  CBC: Recent Labs  Lab 06/03/22 0606 06/04/22 0820 06/05/22 0501 06/06/22 8309 06/07/22 0539 06/08/22 0520 06/09/22 0608  WBC 15.1*   < > 16.0* 13.2* 12.6* 12.2* 11.8*  NEUTROABS 11.0*  --   --  9.2* 9.3*  --   --   HGB 7.4*   < > 8.5*  8.5* 8.4*  8.4* 8.4* 8.8*  HCT 25.0*   < > 27.8*  27.9* 27.5* 27.2* 27.4* 29.0*  MCV 98.4   < > 97.9 96.2 96.1 96.1 96.0  PLT 309   < > 302 288 285 272 290   < > = values in this interval not displayed.  Basic Metabolic Panel: Recent Labs  Lab 06/05/22 0500 06/06/22 0619 06/07/22 0539 06/08/22 0520 06/09/22 0608  NA 139 142 137 137 139  K 5.1 5.5* 4.6 4.4 4.3  CL 116* 118* 112* 109 110  CO2 18* 20* 19* 23 23  GLUCOSE 90 94 92 100* 98  BUN 38* 36* 36* 35* 32*  CREATININE 3.58* 3.54* 3.64* 3.53* 3.37*  CALCIUM 8.0* 8.6* 8.5* 8.6* 8.5*     GFR: Estimated Creatinine Clearance: 14.8 mL/min (A) (by C-G formula based on SCr of 3.37 mg/dL (H)).    Recent Results (from the past 240 hour(s))  Urine Culture     Status: Abnormal   Collection Time: 05/30/22  3:49 PM   Specimen: Urine, Clean Catch  Result Value Ref Range Status   Specimen Description   Final    URINE, CLEAN CATCH Performed at [facility], [place] [address], [place], [place] [address]    Special Requests   Final    NONE Performed at [facility], [place] [address] [place], [place] [address]    Culture 30,000 COLONIES/mL CITROBACTER FREUNDII (A)  Final   Report Status 06/01/2022 FINAL  Final   Organism ID, Bacteria CITROBACTER FREUNDII (A)  Final      Susceptibility   Citrobacter freundii - MIC*    CEFAZOLIN >=64 RESISTANT Resistant     CEFEPIME 0.25 SENSITIVE Sensitive     CEFTRIAXONE >=64 RESISTANT Resistant     CIPROFLOXACIN <=0.25 SENSITIVE Sensitive     GENTAMICIN <=1 SENSITIVE Sensitive     IMIPENEM <=0.25 SENSITIVE Sensitive     NITROFURANTOIN <=16 SENSITIVE Sensitive     TRIMETH/SULFA <=20 SENSITIVE Sensitive     PIP/TAZO 32 INTERMEDIATE Intermediate     * 30,000 COLONIES/mL CITROBACTER FREUNDII  Culture, blood (Routine X 2) w Reflex to ID Panel     Status: None   Collection Time: 06/01/22 11:56 AM   Specimen: BLOOD  Result Value Ref Range Status   Specimen  Description   Final    BLOOD BLOOD RIGHT ARM Performed at [facility] [address], [place], [place] [address]    Special Requests   Final    BOTTLES DRAWN AEROBIC AND ANAEROBIC Blood Culture adequate volume Performed at [facility] [address], [place], [place] [address]    Culture   Final    NO GROWTH 5 DAYS Performed at [facility], [place] [address], [place], [place] [address]    Report Status 06/06/2022 FINAL  Final  Culture, blood (Routine X 2) w Reflex to ID Panel     Status: None   Collection Time: 06/01/22 11:57 AM   Specimen: BLOOD  Result Value Ref Range Status   Specimen Description   Final    BLOOD BLOOD LEFT HAND Performed at [facility] [address] [place], [place] [address]    Special Requests   Final    BOTTLES DRAWN AEROBIC AND ANAEROBIC Blood Culture adequate volume Performed at [facility] [address], [place], [place] [address]    Culture   Final    NO GROWTH 5 DAYS Performed at [facility], [place] [address], [place], [place] [address]    Report Status 06/06/2022 FINAL  Final      Radiology Studies: No results found.     LOS: 10 days   Joel Mer[employer] on [email or web address]  06/09/2022, 10:59 AM

## 2022-06-09 NOTE — Anesthesia Preprocedure Evaluation (Addendum)
Anesthesia Evaluation  Patient identified by MRN, date of birth, ID band Patient awake    Reviewed: Allergy & Precautions, NPO status , Patient's Chart, lab work & pertinent test results  Airway Mallampati: I  TM Distance: >3 FB Neck ROM: Full    Dental no notable dental hx. (+) Teeth Intact, Dental Advisory Given   Pulmonary former smoker, PE (hx off eliquis due to Hematuria)   Pulmonary exam normal breath sounds clear to auscultation       Cardiovascular hypertension, + CAD and +CHF  Normal cardiovascular exam+ Valvular Problems/Murmurs MR  Rhythm:Regular Rate:Normal  06/01/22 echo 1. Left ventricular ejection fraction, by estimation, is 60 to 65%. Left  ventricular ejection fraction by 3D volume is 63 %. The left ventricle has  normal function. The left ventricle has no regional wall motion  abnormalities. Left ventricular diastolic  parameters are consistent with Grade I diastolic dysfunction (impaired  relaxation).  2. Right ventricular systolic function is normal. The right ventricular  size is normal. There is normal pulmonary artery systolic pressure. The  estimated right ventricular systolic pressure is 14.4 mmHg.  3. The mitral valve is grossly normal. Mild mitral valve regurgitation.  4. The aortic valve is tricuspid. There is mild calcification of the  aortic valve. There is mild thickening of the aortic valve. Aortic valve  regurgitation is not visualized. Aortic valve sclerosis/calcification is  present, without any evidence of  aortic stenosis.  5. There is Severe (Grade IV) plaque involving the ascending aorta.  6. The inferior vena cava is normal in size with <50% respiratory  variability, suggesting right atrial pressure of 8 mmHg.    Neuro/Psych PSYCHIATRIC DISORDERS Anxiety Depression    GI/Hepatic GERD  ,  Endo/Other    Renal/GU CRF and Renal InsufficiencyRenal diseaseLab Results      Component                 Value               Date                      CREATININE               3.37 (H)            06/09/2022              K                        4.3                 06/09/2022                  Urothelial cancer    Musculoskeletal   Abdominal   Peds  Hematology  (+) Blood dyscrasia, anemia , Lab Results      Component                Value               Date                      WBC                      11.8 (H)            06/09/2022                HGB  8.8 (L)             06/09/2022                HCT                      29.0 (L)            06/09/2022               PLT                      290                 06/09/2022              Anesthesia Other Findings All: Erythromycin, Benazapril, nitrofurantoin  Reproductive/Obstetrics                           Anesthesia Physical Anesthesia Plan  ASA: 3  Anesthesia Plan: General   Post-op Pain Management: Minimal or no pain anticipated and Ofirmev IV (intra-op)*   Induction: Intravenous  PONV Risk Score and Plan: 4 or greater and Treatment may vary due to age or medical condition, Ondansetron and Midazolam  Airway Management Planned: LMA  Additional Equipment: None  Intra-op Plan:   Post-operative Plan:   Informed Consent: I have reviewed the patients History and Physical, chart, labs and discussed the procedure including the risks, benefits and alternatives for the proposed anesthesia with the patient or authorized representative who has indicated his/her understanding and acceptance.     Dental advisory given  Plan Discussed with:   Anesthesia Plan Comments:        Anesthesia Quick Evaluation

## 2022-06-09 NOTE — Progress Notes (Addendum)
Patient left floor to go to preop for procedure via wheelchair.

## 2022-06-09 NOTE — Op Note (Signed)
Preoperative diagnosis: Left hydronephrosis with history of urothelial carcinoma of the left renal pelvis and left ureter, recurring urinary tract infections, acute renal insufficiency  Postoperative diagnosis: Same  [place] procedure: Cystoscopy, left double-J stent extraction, left retrograde ureteropyelogram, fluoroscopic interpretation, replacement of double-J stent on left-7 [place] by 24 cm without tether  Surgeon: Rayburn Mundis  Anesthesia: General with LMA  Complications: None  Specimen: None  Estimated blood loss: None  Indications: 76 year old female with longstanding history of urothelial carcinoma.  She is status post right nephro ureterectomy many years ago.  She had no recurrence until 2020 when she was found to have obstruction of the left ureter.  Urgent stenting followed by eventual ureteroscopy revealed left distal ureteral tumors, fairly small.  These were treated with laser ablation.  More recently she has had positive cytologies and small volume recurrence in her upper pole calyceal system.  She was treated with percutaneously placed Jelmyto in the left renal pelvis in late 2022 and had a second course started earlier this year.  She did not tolerate this.  She has an indwelling stent.  She has had recurring infections requiring hospitalization.  She has had recent acute renal insufficiency with her admission for the current infection.  There has been a question of whether this stent has been draining properly.  However, she has had adequate urinary output and very minimal hydronephrosis on renal ultrasound, so it is unlikely that she has obstruction from the stent, or at least significantly so.  However, she has had recurring infections and at this point we will replace her stent which was placed 2-1/2 months ago to hopefully limit the infectious processes.  Findings: Urothelium of the bladder was normal.  Retrograde study of the left ureter was performed using a 6 [place] open-ended  catheter and Omnipaque.  This revealed a normal distal ureter.  The ureter over the sacrum was mildly narrowed.  There was no significant filling defect there, however.  Small bubbles present at the left ureterovesical junction.  Somewhat irregular appearance of the left upper pole calyceal system, perhaps secondary to her recent infection.  Description of procedure: The patient was properly identified and marked in the holding area.  She is taken to the operating room where general anesthetic was administered with the LMA.  She was placed in the dorsolithotomy position.  Genitalia and perineum were prepped, draped, proper timeout performed.  21 French panendoscope advanced into the bladder.  Inspection performed, found to be normal.  Solitary left ureteral orifice noted with stent present.  Stent was grasped and removed quite easily.  Retrograde study was then performed with the above-mentioned findings noted.  Following this, guidewire advanced through the open-ended catheter into the upper pole calyceal system.  Once identified properly, the open-ended catheter was removed.  A 7 French by 24 cm contour double-J stent was then passed easily over top of the guidewire.  Once adequately positioned the guidewire was removed and the stent deployed.  Adequate curls were seen in the upper pole calyceal system in the bladder using fluoroscopy and cystoscopy, respectively.  At this point the bladder was drained and the scope removed.  The patient was then awakened and taken to the PACU, having tolerated the procedure well.

## 2022-06-10 ENCOUNTER — Encounter (HOSPITAL_COMMUNITY): Payer: Self-pay | Admitting: Urology

## 2022-06-10 DIAGNOSIS — N179 Acute kidney failure, unspecified: Secondary | ICD-10-CM | POA: Diagnosis not present

## 2022-06-10 DIAGNOSIS — D649 Anemia, unspecified: Secondary | ICD-10-CM | POA: Diagnosis not present

## 2022-06-10 DIAGNOSIS — C689 Malignant neoplasm of urinary organ, unspecified: Secondary | ICD-10-CM | POA: Diagnosis not present

## 2022-06-10 LAB — CBC
HCT: 29.2 % — ABNORMAL LOW (ref 36.0–46.0)
Hemoglobin: 9.1 g/dL — ABNORMAL LOW (ref 12.0–15.0)
MCH: 29.7 pg (ref 26.0–34.0)
MCHC: 31.2 g/dL (ref 30.0–36.0)
MCV: 95.4 fL (ref 80.0–100.0)
Platelets: 323 10*3/uL (ref 150–400)
RBC: 3.06 MIL/uL — ABNORMAL LOW (ref 3.87–5.11)
RDW: 17.2 % — ABNORMAL HIGH (ref 11.5–15.5)
WBC: 10.3 10*3/uL (ref 4.0–10.5)
nRBC: 0 % (ref 0.0–0.2)

## 2022-06-10 LAB — BASIC METABOLIC PANEL
Anion gap: 9 (ref 5–15)
BUN: 32 mg/dL — ABNORMAL HIGH (ref 8–23)
CO2: 24 mmol/L (ref 22–32)
Calcium: 9 mg/dL (ref 8.9–10.3)
Chloride: 110 mmol/L (ref 98–111)
Creatinine, Ser: 2.71 mg/dL — ABNORMAL HIGH (ref 0.44–1.00)
GFR, Estimated: 18 mL/min — ABNORMAL LOW (ref >60)
Glucose, Bld: 135 mg/dL — ABNORMAL HIGH (ref 70–99)
Potassium: 4.4 mmol/L (ref 3.5–5.1)
Sodium: 143 mmol/L (ref 135–145)

## 2022-06-10 NOTE — Progress Notes (Signed)
[facility] Progress Note   76 y.o. female HTN, HLD, CASHD, dCHF, PE 04/2022, right nephro ureterectomy 2006 for high grade urothelial carcinoma, left sided urothelial cancer (renal pelvis and ureter) discovered 05/2019 treated with laser ablation. Then recurrence on left side in 2022 ltx initially with Jelmyto d/c bec of obstruction. S/p  double J stent + nephrostogram through left nephrostomy tube + removal of nephrostomy tube on 03/23/2022. Patient recently admitted  7/14-7/19 with Staph hominis bacteremia tx with Vancomycin transitioning to linezolid. Patient also with cictrobacter freundii + urine cultures. Patient was also on Eliquis for a PE discovered during that hospitalization. Patient now presenting with nausea, vomiting, anorexia, fatigue found to be afebrile in the ED but SBP was in the 80's. BUN/Cr was also 35/2.57  with a WBC of 21.6. Cr was in the 1.3-1.5 range in 04/21/22 - 05/16/22. Ultrasound showed normal cortical thickness, slightly increased cortical echogenicity, mild LEFT hydronephrosis.  Assessment/ Plan:   Acute kidney injury on CKD 3 w/ a baseline cr 1.3-1.5. Unclear etiology of AKI as by history and PE the patient does not appear +10.5 L. May have ATN from the UTI vs obstructive uropathy. I would think it's too early for AIN and her renal function was already worsening immediately upon admission. Usually if there is a question of cause of AKI or to r/o AIN I would biopsy but a solitary kidney is a contraindication. - Strict I&O's, daily weights (stable at 80kg) - Renal ultrasound only shows mild left sided hydro but this is in the context of a solitary kidney. CXR does not show congestion. - Did not see pigmented casts but some normomorphic RBC, florid WBC's c/w UTI.  - UOP has been good and renal function improving. AKI likely associated with the UTI or it may be mild ATN.   - Vancomycin trough level only 5   Appreciate Dr. Diona Fanti seeing the patient; changing  out the stent 9/1. Mild hydronephrosis is not the cause of the AKI but ongoing infection may have been contributing.   If she is nearing d/c CKA can follow her up in clinic in 2-4 weeks to see where her renal function settles in at. She's starting to feel better and appetite a little improved.  -Maintain MAP>65 for optimal renal perfusion; actually hypertensive at this point.  - Avoid nephrotoxic agents such as IV contrast, NSAIDs, and phosphate containing bowel preps (FLEETS)   UTI growing Citrobacter currently on ciprofloxacin. Could the stent be the source of the infection? J stent is still present. Aneimia - unable to give IV iron w active infection. Transfuse if symptomatic or if Hb drops below 7.  Initially hypotenstive but now hypertensive being appropriately treated. H/o PE - but Eliquis and ASA on hold for hematuria Urothelial cancer  Subjective:   Feeling better, no nausea and appetite starting to slowly improve.    Objective:   BP (!) 166/77 (BP Location: Right Arm)   Pulse 63   Temp (!) 97.5 F (36.4 C) (Oral)   Resp 18   Ht '5\' 3"'$  (1.6 m)   Wt 76.7 kg   SpO2 95%   BMI 29.95 kg/m   Intake/Output Summary (Last 24 hours) at 06/10/2022 1206 Last data filed at 06/10/2022 0500 Gross per 24 hour  Intake 1369 ml  Output 1055 ml  Net 314 ml   Weight change: -2.397 kg  Physical Exam: General appearance: alert, cooperative Head: NCAT Neck: no adenopathy Back: symmetric, no curvature. No CVA tenderness. Resp: rales  bibasilar and bilaterally (dry rales) Cardio: regular rate and rhythm GI: soft, non-tender; bowel sounds normal Extremities: edema tr Pulses: 2+ and symmetric Skin: No rashes or lesions  Imaging: DG C-Arm 1-60 Min-No Report  Result Date: 06/09/2022 Fluoroscopy was utilized by the requesting physician.  No radiographic interpretation.    Labs: BMET Recent Labs  Lab 06/04/22 0820 06/05/22 0500 06/06/22 0619 06/07/22 0539 06/08/22 0520 06/09/22 0608  06/10/22 0526  NA 138 139 142 137 137 139 143  K 5.2* 5.1 5.5* 4.6 4.4 4.3 4.4  CL 117* 116* 118* 112* 109 110 110  CO2 17* 18* 20* 19* '23 23 24  '$ GLUCOSE 88 90 94 92 100* 98 135*  BUN 34* 38* 36* 36* 35* 32* 32*  CREATININE 3.11* 3.58* 3.54* 3.64* 3.53* 3.37* 2.71*  CALCIUM 7.8* 8.0* 8.6* 8.5* 8.6* 8.5* 9.0   CBC Recent Labs  Lab 06/06/22 0619 06/07/22 0539 06/08/22 0520 06/09/22 0608 06/10/22 0526  WBC 13.2* 12.6* 12.2* 11.8* 10.3  NEUTROABS 9.2* 9.3*  --   --   --   HGB 8.4* 8.4* 8.4* 8.8* 9.1*  HCT 27.5* 27.2* 27.4* 29.0* 29.2*  MCV 96.2 96.1 96.1 96.0 95.4  PLT 288 285 272 290 323    Medications:     calcium carbonate  1 tablet Oral TID WC   carvedilol  3.125 mg Oral BID WC   ezetimibe  10 mg Oral Daily   famotidine  10 mg Oral QODAY   feeding supplement  1 Container Oral TID BM   feeding supplement  237 mL Oral BID BM   polyethylene glycol  17 g Oral BID   rosuvastatin  10 mg Oral q morning   senna-docusate  2 tablet Oral QHS   sertraline  50 mg Oral q morning   traZODone  150 mg Oral QHS      Otelia Santee, MD 06/10/2022, 12:06 PM

## 2022-06-10 NOTE — Progress Notes (Signed)
Mobility Specialist - Progress Note   06/10/22 1100  Mobility  Activity Ambulated with assistance in hallway  Level of Assistance Contact guard assist, steadying assist  Assistive Device Front wheel walker  Distance Ambulated (ft) 500 ft  Activity Response Tolerated well  $Mobility charge 1 Mobility   PT received in bed and agreed for mobility, no c/o pain nor discomfort Pt returned to bed with all needs met and call bell within reach.   Roderick Pee Mobility Specialist

## 2022-06-10 NOTE — Progress Notes (Signed)
[facility] PROGRESS NOTE   [name] YTK:[id number] DOB: [birth year] DOA: 05/30/2022  PCP: [name], MD  Brief History/Interval Summary: 76 y.o. female with PMH significant for urothelial cancer, HTN, HLD, CAD, CHF, CKD, PE in July 2023, GERD, anxiety depression. Patient has history of urothelial cancer.  Several years ago, she had high-grade urothelial carcinoma of the right distal ureter and underwent right nephro ureterectomy.  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  It was then treated with laser ablation.  In 2022, she was found to have a small volume recurrence in the left calyceal system.  It was treated with focal ablation but also with injection of Jelmyto in the left collecting system through her nephrostomy tube.  She was started on her second round of chemotherapy which was supposed to go for 6 weeks but she was not able to continue that for more than 3 weeks because of significant pain and left ureteral obstruction. On 03/23/2022, she underwent removal of nephrostomy tube and placement of DJ stent on the left.  Subsequently she had UTI as an outpatient twice treated with antibiotics. 7/14 to 7/19, patient was hospitalized with fever, nausea, vomiting, generalized weakness.  She was noted to have sepsis secondary to left-sided pyelonephritis.  Blood culture grew staph hominis and capitis as well as Staph epidermidis.  Per ID recommendation, she was treated with IV vancomycin and was discharged on 9 days of oral Zyvox which she completed post discharge.  Ureteral stent was left in place.  During that hospitalization, patient was also found to have a small right lower lobe pulm embolism and was started on Eliquis following with long-term aspirin.  Patient states that since starting Eliquis, she started to get hematuria. Patient was recently hospitalized 8/7 to 8/8 for low  hemoglobin, FOBT positive and urinalysis with RBCs.  She underwent 2 units of blood transfusion.  [facility] and urologist Dr. Gloriann Loan were consulted.  Hemoglobin stabilized and she was discharged without intervention.  Patient was advised to stop baby aspirin and any other NSAIDs. In the interval, she saw her PCP and was resumed again on Eliquis at half dose of 2.5 mg twice daily and a baby aspirin.  She has started to notice hematuria again. She was in her usual state of health until last night.  Per husband, last night she started to feel warm and had chills.  Around 3 AM this morning, she started having nausea and vomited twice.  She started to get weak and hence EMS was called. EMS noted lobe pressure and give normal saline bolus in route to the ED. In the ED, patient was afebrile, heart rate in 80s, had a low blood pressure in 80s, breathing on room air Labs with BUN/creatinine elevated to 32/3.17, troponin elevated to 230, lactic acid elevated to 3.7 WBC count elevated to 21.6, hemoglobin low at 9.1, glucose elevated to 120 Urinalysis with cloudy yellow urine with moderate amount of hemoglobin, large amount of leukocytes, positive nitrite, many bacteria EKG in normal sinus rhythm with QTc 459 ms Admitted to hospitalist service   Consultants: Nephrology.  Urology.  Procedures: Ureteral stent replacement 9/1    Subjective/Interval History: Patient feels well this morning.  Passing a lot of urine overnight.  Denies any chest pain shortness of breath.    Assessment/Plan:  Acute kidney injury on chronic kidney disease stage IIIb Baseline creatinine is 1.5.  Presented with creatinine of 3.17.  Nephrology was consulted.  Renal function worsened and appears to have plateaued over the last few days.  She has been making urine. Renal ultrasound did suggest left hydronephrosis which was seen on previous studies as well.  This was discussed with urology.  Her left ureteral stent was replaced on  9/1. Creatinine noted to be better this morning compared to yesterday.  We will recheck labs tomorrow.  Monitor urine output.  Hopefully will continue to see improvement.    Urinary tract infection, complicated/sepsis present on admission Presented with nausea vomiting chills leukocytosis lactic acidosis and acute kidney injury.  Recent hospitalization for pyelonephritis and coagulase-negative staph bacteremia treated with vancomycin and then with linezolid. Started again on broad-spectrum antibiotics.  Urine culture grew 30,000 colonies of Citrobacter.  ID consult was obtained.  Switched over to ciprofloxacin.  She has completed course of antibiotics.  WBC has improved.  She remains afebrile.  Staphylococcus hominis blood This was noted in 1 out of 4 cultures.  Thought to be a contaminant.  History of urothelial cancer status post left ureteral stent S/p right distal ureter and underwent right nephro ureterectomy several years ago Recurrence in left ureter in 2020 s/p focal ablation Recurrence in left calyceal system in 2022.  Was getting chemotherapy to nephrostomy tube under the care of Dr. Diona Fanti.  It was stopped because of intolerance and left ureteral obstruction S/p left ureter DJ stent 03/23/2022 Continue Ditropan Left ureteral stent was replaced on 9/1.  Hyperkalemia Resolved   Normocytic anemia/chronic blood loss anemia Some of the anemia is likely due to hematuria as well.  Hemoglobin was apparently 12 until May 2023 and has been gradually trending down since then.  Hemoglobin was 6.9 on 8/27 and she was transfused 1 unit of PRBC.  Hemoglobin stable for the last several days.  Elevated troponin Likely demand ischemia.  No chest pain.  Hypotension Blood pressures were in the 80s at presentation.  Now in the hypertensive range.  Continue carvedilol.  Avoid tight control due to her kidney injury. She was on hydralazine and isosorbide mononitrate prior to admission.  If renal  function continues to improve then this can be resumed at discharge.  History of congestive heart failure Had systolic CHF based on echo from 2020 when a EF of 40 to 45%.   Based on echo from August of this year EF has improved to 60 to 65%.  Grade 1 diastolic dysfunction was noted. Seems to be fairly euvolemic.  Noted to be on carvedilol which is continued.  History of coronary artery disease Stable.  Continue home medications.  Acute pulmonary embolism Diagnosed in July 2023.  This was a small PE in the right lower lobe pulmonary artery.  She was started on Eliquis however has not been able to tolerate it due to recurrent hematuria.  Currently Eliquis is on hold.  This was held as per recommendation of her outpatient providers.  Resumption will be deferred to them.  History of anxiety and depression Continue home medications.  Obesity Estimated body mass index is 29.95 kg/m as calculated from the following:   Height as of this encounter: '5\' 3"'$  (1.6 m).   Weight as of this encounter: 76.7 kg.   DVT Prophylaxis: SCDs Code Status: Full code Family Communication: Discussed with patient Disposition Plan: Hopefully return home when improved.  Continue to mobilize.  Status is: Inpatient Remains inpatient appropriate because: Acute kidney injury      Medications: Scheduled:  calcium carbonate  1 tablet Oral TID WC   carvedilol  3.125 mg Oral BID WC   ezetimibe  10 mg Oral Daily   famotidine  10 mg Oral QODAY   feeding supplement  1 Container Oral TID BM   feeding supplement  237 mL Oral BID BM   polyethylene glycol  17 g Oral BID   rosuvastatin  10 mg Oral q morning   senna-docusate  2 tablet Oral QHS   sertraline  50 mg Oral q morning   traZODone  150 mg Oral QHS   Continuous: [id number] **OR** acetaminophen, albuterol, hydrALAZINE, lip balm, ondansetron (ZOFRAN) IV, oxybutynin  Antibiotics: Anti-infectives (From admission, onward)    Start     Dose/Rate Route  Frequency Ordered Stop   06/02/22 1100  ciprofloxacin (CIPRO) tablet 250 mg        250 mg Oral Daily 06/02/22 0943 06/07/22 0812   05/31/22 1100  ceFEPIme (MAXIPIME) 2 g in sodium chloride 0.9 % 100 mL IVPB  Status:  Discontinued        2 g 200 mL/hr over 30 Minutes Intravenous Every 24 hours 05/30/22 1518 06/02/22 0943   05/30/22 1517  vancomycin variable dose per unstable renal function (pharmacist dosing)  Status:  Discontinued         Does not apply See admin instructions 05/30/22 1518 05/31/22 1214   05/30/22 1030  ceFEPIme (MAXIPIME) 2 g in sodium chloride 0.9 % 100 mL IVPB        2 g 200 mL/hr over 30 Minutes Intravenous  Once 05/30/22 1024 05/30/22 1125   05/30/22 1030  vancomycin (VANCOREADY) IVPB 1500 mg/300 mL        1,500 mg 150 mL/hr over 120 Minutes Intravenous  Once 05/30/22 1024 05/30/22 1242       Objective:  Vital Signs  Vitals:   06/09/22 1500 06/09/22 2050 06/10/22 0500 06/10/22 0513  BP:  (!) 148/65  (!) 166/77  Pulse:  (!) 57  63  Resp: '20 18  18  '$ Temp:  97.6 F (36.4 C)  (!) 97.5 F (36.4 C)  TempSrc:  Oral  Oral  SpO2:  95%  95%  Weight:   76.7 kg   Height:        Intake/Output Summary (Last 24 hours) at 06/10/2022 0953 Last data filed at 06/10/2022 0500 Gross per 24 hour  Intake 1369 ml  Output 1055 ml  Net 314 ml    Filed Weights   06/08/22 0408 06/09/22 0601 06/10/22 0500  Weight: 80 kg 79.1 kg 76.7 kg    General appearance: Awake alert.  In no distress Resp: Clear to auscultation bilaterally.  Normal effort Cardio: S1-S2 is normal regular.  No S3-S4.  No rubs murmurs or bruit GI: Abdomen is soft.  Nontender nondistended.  Bowel sounds are present normal.  No masses organomegaly Extremities: No edema.  Full range of motion of lower extremities. Neurologic: Alert and oriented x3.  No focal neurological deficits.      Lab Results:  Data Reviewed: I have personally reviewed following labs and reports of the imaging  studies  CBC: Recent Labs  Lab 06/06/22 0619 06/07/22 0539 06/08/22 0520 06/09/22 0608 06/10/22 0526  WBC 13.2* 12.6* 12.2* 11.8* 10.3  NEUTROABS 9.2* 9.3*  --   --   --   HGB 8.4* 8.4* 8.4* 8.8* 9.1*  HCT 27.5* 27.2* 27.4* 29.0* 29.2*  MCV 96.2 96.1 96.1 96.0 95.4  PLT 288 285 272 290 323  Basic Metabolic Panel: Recent Labs  Lab 06/06/22 0619 06/07/22 0539 06/08/22 0520 06/09/22 0608 06/10/22 0526  NA 142 137 137 139 143  K 5.5* 4.6 4.4 4.3 4.4  CL 118* 112* 109 110 110  CO2 20* 19* '23 23 24  '$ GLUCOSE 94 92 100* 98 135*  BUN 36* 36* 35* 32* 32*  CREATININE 3.54* 3.64* 3.53* 3.37* 2.71*  CALCIUM 8.6* 8.5* 8.6* 8.5* 9.0     GFR: Estimated Creatinine Clearance: 18.1 mL/min (A) (by C-G formula based on SCr of 2.71 mg/dL (H)).    Recent Results (from the past 240 hour(s))  Culture, blood (Routine X 2) w Reflex to ID Panel     Status: None   Collection Time: 06/01/22 11:56 AM   Specimen: BLOOD  Result Value Ref Range Status   Specimen Description   Final    BLOOD BLOOD RIGHT ARM Performed at [facility] [address] [place], [place] [address]    Special Requests   Final    BOTTLES DRAWN AEROBIC AND ANAEROBIC Blood Culture adequate volume Performed at [facility] [address], [place], [place] [address]    Culture   Final    NO GROWTH 5 DAYS Performed at [facility], [place] [address], [place], [place] [address]    Report Status 06/06/2022 FINAL  Final  Culture, blood (Routine X 2) w Reflex to ID Panel     Status: None   Collection Time: 06/01/22 11:57 AM   Specimen: BLOOD  Result Value Ref Range Status   Specimen Description   Final    BLOOD BLOOD LEFT HAND Performed at [facility] [address], [place], [place] [address]    Special Requests   Final    BOTTLES DRAWN AEROBIC AND ANAEROBIC Blood Culture adequate volume Performed at [facility]  [address], [place],  [address]    Culture   Final    NO GROWTH 5 DAYS Performed at [facility], [place] [address], [place],  [address]    Report Status 06/06/2022 FINAL  Final      Radiology Studies: DG C-Arm 1-60 Min-No Report  Result Date: 06/09/2022 Fluoroscopy was utilized by the requesting physician.  No radiographic interpretation.       LOS: 11 days   Meya Clutter [employer] on [email or web address]  06/10/2022, 9:53 AM

## 2022-06-11 DIAGNOSIS — N179 Acute kidney failure, unspecified: Secondary | ICD-10-CM | POA: Diagnosis not present

## 2022-06-11 LAB — BASIC METABOLIC PANEL
Anion gap: 8 (ref 5–15)
BUN: 32 mg/dL — ABNORMAL HIGH (ref 8–23)
CO2: 24 mmol/L (ref 22–32)
Calcium: 8.6 mg/dL — ABNORMAL LOW (ref 8.9–10.3)
Chloride: 106 mmol/L (ref 98–111)
Creatinine, Ser: 2.48 mg/dL — ABNORMAL HIGH (ref 0.44–1.00)
GFR, Estimated: 20 mL/min — ABNORMAL LOW (ref >60)
Glucose, Bld: 102 mg/dL — ABNORMAL HIGH (ref 70–99)
Potassium: 3.6 mmol/L (ref 3.5–5.1)
Sodium: 138 mmol/L (ref 135–145)

## 2022-06-11 MED ORDER — POLYETHYLENE GLYCOL 3350 17 G PO PACK: 17.0000 g | PACK | Freq: Every day | ORAL | 0 refills | Status: DC

## 2022-06-11 NOTE — Discharge Summary (Signed)
[facility]  Physician Discharge Summary   Patient ID: [name] MRN: [id number] DOB/AGE: August 03, 1946 76 y.o.  Admit date: 05/30/2022 Discharge date: 06/11/2022    PCP: [name], MD  DISCHARGE DIAGNOSES:  AKI (acute kidney injury) [facility]) Urinary tract infection Bacteremia, contaminant History of urothelial cancer Ureteral stent replacement Normocytic anemia Coronary artery disease Prior history of systolic CHF Pulmonary embolism Anxiety and depression   RECOMMENDATIONS FOR OUTPATIENT FOLLOW UP: Patient to follow-up with urology, Dr. Diona Fanti Outpatient follow-up with nephrology   Home Health: None Equipment/Devices: None  CODE STATUS: Full code  DISCHARGE CONDITION: fair  Diet recommendation: As before  INITIAL HISTORY: 76 y.o. female with PMH significant for urothelial cancer, HTN, HLD, CAD, CHF, CKD, PE in July 2023, GERD, anxiety depression. Patient has history of urothelial cancer.  Several years ago, she had high-grade urothelial carcinoma of the right distal ureter and underwent right nephro ureterectomy.  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  It was then treated with laser ablation.  In 2022, she was found to have a small volume recurrence in the left calyceal system.  It was treated with focal ablation but also with injection of Jelmyto in the left collecting system through her nephrostomy tube.  She was started on her second round of chemotherapy which was supposed to go for 6 weeks but she was not able to continue that for more than 3 weeks because of significant pain and left ureteral obstruction. On 03/23/2022, she underwent removal of nephrostomy tube and placement of DJ stent on the left.  Subsequently she had UTI as an outpatient twice treated with antibiotics. 7/14 to 7/19, patient was hospitalized with fever, nausea, vomiting, generalized  weakness.  She was noted to have sepsis secondary to left-sided pyelonephritis.  Blood culture grew staph hominis and capitis as well as Staph epidermidis.  Per ID recommendation, she was treated with IV vancomycin and was discharged on 9 days of oral Zyvox which she completed post discharge.  Ureteral stent was left in place.  During that hospitalization, patient was also found to have a small right lower lobe pulm embolism and was started on Eliquis following with long-term aspirin.  Patient states that since starting Eliquis, she started to get hematuria. Patient was recently hospitalized 8/7 to 8/8 for low hemoglobin, FOBT positive and urinalysis with RBCs.  She underwent 2 units of blood transfusion.  [facility] and urologist Dr. Gloriann Loan were consulted.  Hemoglobin stabilized and she was discharged without intervention.  Patient was advised to stop baby aspirin and any other NSAIDs. In the interval, she saw her PCP and was resumed again on Eliquis at half dose of 2.5 mg twice daily and a baby aspirin.  She has started to notice hematuria again. She was in her usual state of health until last night.  Per husband, last night she started to feel warm and had chills.  Around 3 AM this morning, she started having nausea and vomited twice.  She started to get weak and hence EMS was called. EMS noted lobe pressure and give normal saline bolus in route to the ED. In the ED, patient was afebrile, heart rate in 80s, had a low blood pressure in 80s, breathing on room air Labs with BUN/creatinine elevated to 32/3.17, troponin elevated to 230, lactic acid elevated to 3.7 WBC count elevated to 21.6, hemoglobin low at 9.1, glucose elevated to  120 Urinalysis with cloudy yellow urine with moderate amount of hemoglobin, large amount of leukocytes, positive nitrite, many bacteria EKG in normal sinus rhythm with QTc 459 ms Admitted to hospitalist service     Consultants: Nephrology.  Urology.   Procedures: Ureteral stent  replacement 9/1     HOSPITAL COURSE:   Acute kidney injury on chronic kidney disease stage IIIb Baseline creatinine is 1.5.  Presented with creatinine of 3.17.  Nephrology was consulted.  Renal function was followed closely.  Appeared to worsen and then plateaued.  Remained greater than 3.0.   Renal ultrasound did suggest left hydronephrosis which was seen on previous studies as well.  This was discussed with urology.  Her left ureteral stent was replaced on 9/1. Subsequent to this renal function appears to be improving.  Creatinine down to 2.4.  Discussed with nephrology.  We feel that she is ready for discharge.  Nephrology to arrange outpatient follow-up to recheck labs.   Urinary tract infection, complicated/sepsis present on admission Presented with nausea vomiting chills leukocytosis lactic acidosis and acute kidney injury.  Recent hospitalization for pyelonephritis and coagulase-negative staph bacteremia treated with vancomycin and then with linezolid. Started again on broad-spectrum antibiotics.  Urine culture grew 30,000 colonies of Citrobacter.  ID consult was obtained.  Switched over to ciprofloxacin.  She has completed course of antibiotics.  WBC has improved.  She remains afebrile.   Staphylococcus hominis blood This was noted in 1 out of 4 cultures.  Thought to be a contaminant.   History of urothelial cancer status post left ureteral stent S/p right distal ureter and underwent right nephro ureterectomy several years ago Recurrence in left ureter in 2020 s/p focal ablation Recurrence in left calyceal system in 2022.  Was getting chemotherapy to nephrostomy tube under the care of Dr. Diona Fanti.  It was stopped because of intolerance and left ureteral obstruction S/p left ureter DJ stent 03/23/2022 Continue Ditropan Left ureteral stent was replaced on 9/1.   Hyperkalemia Resolved    Normocytic anemia/chronic blood loss anemia Some of the anemia is likely due to hematuria as  well.  Hemoglobin was apparently 12 until May 2023 and has been gradually trending down since then.  Hemoglobin was 6.9 on 8/27 and she was transfused 1 unit of PRBC.  Hemoglobin stable for the last several days.   Elevated troponin Likely demand ischemia.  No chest pain.   Hypotension Blood pressures were in the 80s at presentation.  Clinically rebounded.  She was continued on her home medication regimen.    History of systolic congestive heart failure, now recovered Had systolic CHF based on echo from 2020 when a EF of 40 to 45%.   Based on echo from August of this year EF has improved to 60 to 65%.  Grade 1 diastolic dysfunction was noted. Seems to be fairly euvolemic.     History of coronary artery disease Stable.  Continue home medications.   Acute pulmonary embolism Diagnosed in July 2023.  This was a small PE in the right lower lobe pulmonary artery.  She was started on Eliquis however has not been able to tolerate it due to recurrent hematuria.  Currently Eliquis is on hold.  This was held as per recommendation of her outpatient providers.  Resumption will be deferred to them.   History of anxiety and depression Continue home medications.   Obesity Estimated body mass index is 29.95 kg/m as calculated from the following:   Height as of this encounter: '5\' 3"'$  (  1.6 m).   Weight as of this encounter: 76.7 kg.     Patient is stable.  Feels better.  Looking forward to going home today.  Okay for discharge.   PERTINENT LABS:  The results of significant diagnostics from this hospitalization (including imaging, microbiology, ancillary and laboratory) are listed below for reference.     Labs:   Basic Metabolic Panel: Recent Labs  Lab 06/07/22 0539 06/08/22 0520 06/09/22 0608 06/10/22 0526 06/11/22 0542  NA 137 137 139 143 138  K 4.6 4.4 4.3 4.4 3.6  CL 112* 109 110 110 106  CO2 19* '23 23 24 24  '$ GLUCOSE 92 100* 98 135* 102*  BUN 36* 35* 32* 32* 32*  CREATININE 3.64*  3.53* 3.37* 2.71* 2.48*  CALCIUM 8.5* 8.6* 8.5* 9.0 8.6*    CBC: Recent Labs  Lab 06/06/22 0619 06/07/22 0539 06/08/22 0520 06/09/22 0608 06/10/22 0526  WBC 13.2* 12.6* 12.2* 11.8* 10.3  NEUTROABS 9.2* 9.3*  --   --   --   HGB 8.4* 8.4* 8.4* 8.8* 9.1*  HCT 27.5* 27.2* 27.4* 29.0* 29.2*  MCV 96.2 96.1 96.1 96.0 95.4  PLT 288 285 272 290 323      IMAGING STUDIES DG C-Arm 1-60 Min-No Report  Result Date: 06/09/2022 Fluoroscopy was utilized by the requesting physician.  No radiographic interpretation.   DG CHEST PORT 1 VIEW  Result Date: 06/05/2022 CLINICAL DATA:  Acute renal failure.  Congestive heart failure. EXAM: PORTABLE CHEST 1 VIEW COMPARISON:  AP chest 05/30/2022 FINDINGS: Mildly decreased lung volumes. Cardiac silhouette is mildly enlarged. Mediastinal contours are within normal limits. Mild bibasilar linear subsegmental atelectasis. No high-grade focal airspace opacity. No definite pleural effusion. No pneumothorax. Mild multilevel degenerative disc changes of the thoracic spine. IMPRESSION: Mild multilevel degenerative disc changes of the thoracic spine. Electronically Signed   By: Yvonne Kendall M.D.   On: 06/05/2022 17:16   US RENAL  Result Date: 06/05/2022 CLINICAL DATA:  Acute renal failure EXAM: RENAL / URINARY TRACT ULTRASOUND COMPLETE COMPARISON:  None Available. FINDINGS: Right Kidney: Surgically absent. Left Kidney: Renal measurements: 12.8 x 7.2 x 6.6 cm = volume: 315.1 mL. Echogenicity within normal limits. No mass visualized. Mild hydronephrosis. Bladder: Appears normal for degree of bladder distention. Left ureter jet not visualized. Other: Left pleural effusion. IMPRESSION: 1. Mild left hydronephrosis, ureter jet not visualized which is suggestive of obstruction. 2. Surgically absent right kidney. Electronically Signed   By: Yetta Glassman M.D.   On: 06/05/2022 17:10   DG Abd 1 View  Result Date: 06/02/2022 CLINICAL DATA:  Nausea, vomiting EXAM: ABDOMEN - 1  VIEW COMPARISON:  04/21/2022 FINDINGS: The bowel gas pattern is normal. Left nephroureteral stent remains in place. No radio-opaque calculi or other significant radiographic abnormality are seen. IMPRESSION: Negative. Electronically Signed   By: Davina Poke D.O.   On: 06/02/2022 10:11   ECHOCARDIOGRAM COMPLETE  Result Date: 06/01/2022    ECHOCARDIOGRAM REPORT   Patient Name:   PALLAS WAHLERT Date of Exam: 06/01/2022 Medical Rec #:  [id number]         Height:       63.0 in Accession #:    [id number]        Weight:       164.0 lb Date of Birth:  [birth year]         BSA:          1.777 m Patient Age:    89 years  BP:           142/64 mmHg Patient Gender: F                 HR:           75 bpm. Exam Location:  Inpatient Procedure: 2D Echo, 3D Echo, Cardiac Doppler and Color Doppler Indications:     R94.31 Abnormal EKG  History:         Patient has prior history of Echocardiogram examinations, most                  recent 02/16/2021. CAD; Risk Factors:Hypertension and                  Dyslipidemia. Cancer. Pulmonary embolus.  Sonographer:     Roseanna Rainbow RDCS Referring Phys:  [id number] [facility] Diagnosing Phys: Gwyndolyn Kaufman MD  Sonographer Comments: Image acquisition challenging due to patient body habitus. IMPRESSIONS  1. Left ventricular ejection fraction, by estimation, is 60 to 65%. Left ventricular ejection fraction by 3D volume is 63 %. The left ventricle has normal function. The left ventricle has no regional wall motion abnormalities. Left ventricular diastolic  parameters are consistent with Grade I diastolic dysfunction (impaired relaxation).  2. Right ventricular systolic function is normal. The right ventricular size is normal. There is normal pulmonary artery systolic pressure. The estimated right ventricular systolic pressure is 27.7 mmHg.  3. The mitral valve is grossly normal. Mild mitral valve regurgitation.  4. The aortic valve is tricuspid. There is mild calcification of the aortic  valve. There is mild thickening of the aortic valve. Aortic valve regurgitation is not visualized. Aortic valve sclerosis/calcification is present, without any evidence of aortic stenosis.  5. There is Severe (Grade IV) plaque involving the ascending aorta.  6. The inferior vena cava is normal in size with <50% respiratory variability, suggesting right atrial pressure of 8 mmHg. Comparison(s): Compared to prior TTE on 02/2021, there is no significant change. FINDINGS  Left Ventricle: Left ventricular ejection fraction, by estimation, is 60 to 65%. Left ventricular ejection fraction by 3D volume is 63 %. The left ventricle has normal function. The left ventricle has no regional wall motion abnormalities. The left ventricular internal cavity size was normal in size. There is no left ventricular hypertrophy. Left ventricular diastolic parameters are consistent with Grade I diastolic dysfunction (impaired relaxation). Right Ventricle: The right ventricular size is normal. No increase in right ventricular wall thickness. Right ventricular systolic function is normal. There is normal pulmonary artery systolic pressure. The tricuspid regurgitant velocity is 2.62 m/s, and  with an assumed right atrial pressure of 8 mmHg, the estimated right ventricular systolic pressure is 82.4 mmHg. Left Atrium: Left atrial size was normal in size. Right Atrium: Right atrial size was normal in size. Pericardium: There is no evidence of pericardial effusion. Mitral Valve: The mitral valve is grossly normal. There is mild thickening of the mitral valve leaflet(s). There is mild calcification of the mitral valve leaflet(s). Mild mitral annular calcification. Mild mitral valve regurgitation. MV peak gradient, 11.4 mmHg. The mean mitral valve gradient is 4.0 mmHg. Tricuspid Valve: The tricuspid valve is normal in structure. Tricuspid valve regurgitation is mild. Aortic Valve: The aortic valve is tricuspid. There is mild calcification of the  aortic valve. There is mild thickening of the aortic valve. Aortic valve regurgitation is not visualized. Aortic valve sclerosis/calcification is present, without any evidence of aortic stenosis. Aortic valve mean gradient measures 7.5 mmHg. Aortic valve  peak gradient measures 13.6 mmHg. Aortic valve area, by VTI measures 1.79 cm. Pulmonic Valve: The pulmonic valve was normal in structure. Pulmonic valve regurgitation is trivial. Aorta: The aortic root and ascending aorta are structurally normal, with no evidence of dilitation. There is severe (Grade IV) plaque involving the ascending aorta. Venous: The inferior vena cava is normal in size with less than 50% respiratory variability, suggesting right atrial pressure of 8 mmHg. IAS/Shunts: The atrial septum is grossly normal.  LEFT VENTRICLE PLAX 2D LVIDd:         4.50 cm         Diastology LVIDs:         2.90 cm         LV e' medial:    8.92 cm/s LV PW:         1.20 cm         LV E/e' medial:  14.9 LV IVS:        0.90 cm         LV e' lateral:   7.40 cm/s LVOT diam:     1.90 cm         LV E/e' lateral: 18.0 LV SV:         75 LV SV Index:   42 LVOT Area:     2.84 cm        3D Volume EF                                LV 3D EF:    Left                                             ventricul LV Volumes (MOD)                            ar LV vol d, MOD    55.7 ml                    ejection A2C:                                        fraction LV vol d, MOD    60.9 ml                    by 3D A4C:                                        volume is LV vol s, MOD    21.5 ml                    63 %. A2C: LV vol s, MOD    20.8 ml A4C:                           3D Volume EF: LV SV MOD A2C:   34.2 ml       3D EF:        63 % LV SV MOD A4C:   60.9 ml  LV EDV:       93 ml LV SV MOD BP:    37.1 ml       LV ESV:       34 ml                                LV SV:        58 ml RIGHT VENTRICLE             IVC RV S prime:     13.30 cm/s  IVC diam: 1.90 cm TAPSE (M-mode): 2.4 cm LEFT  ATRIUM             Index        RIGHT ATRIUM           Index LA diam:        3.60 cm 2.03 cm/m   RA Area:     10.60 cm LA Vol (A2C):   58.7 ml 33.03 ml/m  RA Volume:   20.90 ml  11.76 ml/m LA Vol (A4C):   38.5 ml 21.66 ml/m LA Biplane Vol: 48.6 ml 27.35 ml/m  AORTIC VALVE                     PULMONIC VALVE AV Area (Vmax):    1.89 cm      PR End Diast Vel: 2.26 msec AV Area (Vmean):   1.89 cm AV Area (VTI):     1.79 cm AV Vmax:           184.50 cm/s AV Vmean:          126.500 cm/s AV VTI:            0.422 m AV Peak Grad:      13.6 mmHg AV Mean Grad:      7.5 mmHg LVOT Vmax:         123.00 cm/s LVOT Vmean:        84.500 cm/s LVOT VTI:          0.266 m LVOT/AV VTI ratio: 0.63  AORTA Ao Root diam: 2.90 cm Ao Asc diam:  2.90 cm MITRAL VALVE                TRICUSPID VALVE MV Area (PHT): 3.99 cm     TR Peak grad:   27.5 mmHg MV Area VTI:   1.72 cm     TR Vmax:        262.00 cm/s MV Peak grad:  11.4 mmHg MV Mean grad:  4.0 mmHg     SHUNTS MV Vmax:       1.69 m/s     Systemic VTI:  0.27 m MV Vmean:      94.4 cm/s    Systemic Diam: 1.90 cm MV Decel Time: 190 msec MV E velocity: 133.00 cm/s MV A velocity: 153.50 cm/s MV E/A ratio:  0.87 Gwyndolyn Kaufman MD Electronically signed by Gwyndolyn Kaufman MD Signature Date/Time: 06/01/2022/4:34:28 PM    Final (Updated)    US RENAL  Result Date: 06/01/2022 CLINICAL DATA:  Acute kidney injury, history chronic kidney disease, coronary artery disease, LEFT ureteral cancer, CHF, hypertension EXAM: RENAL / URINARY TRACT ULTRASOUND COMPLETE COMPARISON:  CT abdomen and pelvis 04/21/2022 FINDINGS: Right Kidney: Surgically absent post RIGHT ureteronephrectomy Left Kidney: Renal measurements: 12.1 x 6.6 x 7.0 cm = volume: 288 mL. Normal cortical thickness. Slightly increased cortical echogenicity. Mild LEFT hydronephrosis. No  shadowing calculi or renal mass. Bladder: Appears normal for partial degree of bladder distention. Other: N/A IMPRESSION: Post RIGHT nephrectomy. Medical  renal disease changes LEFT kidney with LEFT hydronephrosis. Ureteral stent seen on prior CT not visualized. Electronically Signed   By: Lavonia Dana M.D.   On: 06/01/2022 09:58   DG Chest Port 1 View  Result Date: 05/30/2022 CLINICAL DATA:  Cough. EXAM: PORTABLE CHEST 1 VIEW COMPARISON:  Radiographs 04/21/2022 and 06/28/2019.  CT 04/22/2022. FINDINGS: 1023 hours. The heart size and mediastinal contours are stable. The lungs appear clear for the degree of inspiration. There is no confluent airspace opacity, significant pleural effusion or pneumothorax. The bones appear unchanged. Telemetry leads overlie the chest. IMPRESSION: Stable radiographic appearance of the chest. No evidence of active cardiopulmonary process. Electronically Signed   By: Richardean Sale M.D.   On: 05/30/2022 10:33   MM 3D SCREEN BREAST BILATERAL  Result Date: 05/24/2022 CLINICAL DATA:  Screening. EXAM: DIGITAL SCREENING BILATERAL MAMMOGRAM WITH TOMOSYNTHESIS AND CAD TECHNIQUE: Bilateral screening digital craniocaudal and mediolateral oblique mammograms were obtained. Bilateral screening digital breast tomosynthesis was performed. The images were evaluated with computer-aided detection. COMPARISON:  Previous exam(s). ACR Breast Density Category b: There are scattered areas of fibroglandular density. FINDINGS: There are no findings suspicious for malignancy. IMPRESSION: No mammographic evidence of malignancy. A result letter of this screening mammogram will be mailed directly to the patient. RECOMMENDATION: Screening mammogram in one year. (Code:SM-B-01Y) BI-RADS CATEGORY  1: Negative. Electronically Signed   By: Claudie Revering M.D.   On: 05/24/2022 13:18    DISCHARGE EXAMINATION: Vitals:   06/11/22 0031 06/11/22 0043 06/11/22 0500 06/11/22 0500  BP: (!) 183/84 (!) 176/74  (!) 141/63  Pulse: 74 73  81  Resp: '20 17  18  '$ Temp: 99.2 F (37.3 C)   98.2 F (36.8 C)  TempSrc: Oral   Oral  SpO2: 90% 99%  94%  Weight:   77.2 kg    Height:       General appearance: Awake alert.  In no distress Resp: Clear to auscultation bilaterally.  Normal effort Cardio: S1-S2 is normal regular.  No S3-S4.  No rubs murmurs or bruit GI: Abdomen is soft.  Nontender nondistended.  Bowel sounds are present normal.  No masses organomegaly   DISPOSITION: Home  Discharge Instructions     Call MD for:  difficulty breathing, headache or visual disturbances   Complete by: As directed    Call MD for:  extreme fatigue   Complete by: As directed    Call MD for:  persistant dizziness or light-headedness   Complete by: As directed    Call MD for:  persistant nausea and vomiting   Complete by: As directed    Call MD for:  severe uncontrolled pain   Complete by: As directed    Call MD for:  temperature >100.4   Complete by: As directed    Diet - low sodium heart healthy   Complete by: As directed    Discharge instructions   Complete by: As directed    Please take your medications as prescribed.  Kidney doctor will arrange for follow-up in their office in the next few days.  Seek attention if your symptoms recur.  Monitor your blood pressures at home and if they stay consistently above 160 please reach out to your primary care doctor for further advice.  You were cared for by a hospitalist during your hospital stay. If you have any questions about your discharge  medications or the care you received while you were in the hospital after you are discharged, you can call the unit and asked to speak with the hospitalist on call if the hospitalist that took care of you is not available. Once you are discharged, your primary care physician will handle any further medical issues. Please note that NO REFILLS for any discharge medications will be authorized once you are discharged, as it is imperative that you return to your primary care physician (or establish a relationship with a primary care physician if you do not have one) for your aftercare needs  so that they can reassess your need for medications and monitor your lab values. If you do not have a primary care physician, you can call [phone] for a physician referral.   Increase activity slowly   Complete by: As directed           Allergies as of 06/11/2022       Reactions   Erythromycin Diarrhea, Nausea And Vomiting   Lotensin [benazepril Hcl] Cough   Nitrofurantoin Other (See Comments)        Medication List     STOP taking these medications    Eliquis 2.5 MG Tabs tablet Generic drug: apixaban   hydrALAZINE 50 MG tablet Commonly known as: APRESOLINE   HYDROcodone-acetaminophen 5-325 MG tablet Commonly known as: NORCO/VICODIN       TAKE these medications    acetaminophen 500 MG tablet Commonly known as: TYLENOL Take 500-1,000 mg by mouth every 6 (six) hours as needed for mild pain.   calcium carbonate 750 MG chewable tablet Commonly known as: TUMS EX Chew 1 tablet by mouth every morning.   carvedilol 6.25 MG tablet Commonly known as: COREG TAKE 1 TABLET(6.25 MG) BY MOUTH TWICE DAILY WITH A MEAL What changed:  how much to take how to take this when to take this additional instructions   cholecalciferol 25 MCG (1000 UNIT) tablet Commonly known as: VITAMIN D3 Take 1,000 Units by mouth every morning.   ezetimibe 10 MG tablet Commonly known as: ZETIA Take 1 tablet (10 mg total) by mouth daily.   IRON PO Take 1 tablet by mouth daily.   isosorbide mononitrate 30 MG 24 hr tablet Commonly known as: IMDUR Take 0.5 tablets (15 mg total) by mouth daily. What changed: when to take this   ondansetron 8 MG tablet Commonly known as: ZOFRAN Take 8 mg by mouth 3 (three) times daily as needed for vomiting or nausea.   polyethylene glycol 17 g packet Commonly known as: MIRALAX / GLYCOLAX Take 17 g by mouth daily.   rosuvastatin 40 MG tablet Commonly known as: CRESTOR Take 1 tablet (40 mg total) by mouth daily.   sertraline 50 MG tablet Commonly  known as: ZOLOFT Take 50 mg by mouth every morning.   traZODone 150 MG tablet Commonly known as: DESYREL Take 150 mg by mouth at bedtime.           TOTAL DISCHARGE TIME: 35 minutes  Cherica Heiden [employer] on [email or web address]  06/12/2022, 11:21 AM

## 2022-06-11 NOTE — Plan of Care (Signed)

## 2022-06-21 DIAGNOSIS — R7309 Other abnormal glucose: Secondary | ICD-10-CM | POA: Diagnosis not present

## 2022-06-21 DIAGNOSIS — N1832 Chronic kidney disease, stage 3b: Secondary | ICD-10-CM | POA: Diagnosis not present

## 2022-06-21 DIAGNOSIS — I10 Essential (primary) hypertension: Secondary | ICD-10-CM | POA: Diagnosis not present

## 2022-06-21 DIAGNOSIS — N179 Acute kidney failure, unspecified: Secondary | ICD-10-CM | POA: Diagnosis not present

## 2022-06-21 DIAGNOSIS — Z0000 Encounter for general adult medical examination without abnormal findings: Secondary | ICD-10-CM | POA: Diagnosis not present

## 2022-06-21 DIAGNOSIS — G4700 Insomnia, unspecified: Secondary | ICD-10-CM | POA: Diagnosis not present

## 2022-06-21 DIAGNOSIS — Z23 Encounter for immunization: Secondary | ICD-10-CM | POA: Diagnosis not present

## 2022-06-21 DIAGNOSIS — I5022 Chronic systolic (congestive) heart failure: Secondary | ICD-10-CM | POA: Diagnosis not present

## 2022-06-21 DIAGNOSIS — E7800 Pure hypercholesterolemia, unspecified: Secondary | ICD-10-CM | POA: Diagnosis not present

## 2022-06-26 ENCOUNTER — Telehealth: Payer: Self-pay | Admitting: *Deleted

## 2022-06-26 DIAGNOSIS — N1832 Chronic kidney disease, stage 3b: Secondary | ICD-10-CM | POA: Diagnosis not present

## 2022-06-26 DIAGNOSIS — N189 Chronic kidney disease, unspecified: Secondary | ICD-10-CM | POA: Diagnosis not present

## 2022-06-26 DIAGNOSIS — N179 Acute kidney failure, unspecified: Secondary | ICD-10-CM | POA: Diagnosis not present

## 2022-06-26 DIAGNOSIS — I129 Hypertensive chronic kidney disease with stage 1 through stage 4 chronic kidney disease, or unspecified chronic kidney disease: Secondary | ICD-10-CM | POA: Diagnosis not present

## 2022-06-26 DIAGNOSIS — C689 Malignant neoplasm of urinary organ, unspecified: Secondary | ICD-10-CM | POA: Diagnosis not present

## 2022-06-26 DIAGNOSIS — D631 Anemia in chronic kidney disease: Secondary | ICD-10-CM | POA: Diagnosis not present

## 2022-06-26 NOTE — Telephone Encounter (Signed)
Received message from Dr Rosario Jacks office's. States patient has seen them and [name] and wants to get re-established with Dr Alen Blew to discuss treatment options. Last seen by Dr Alen Blew on 01/22/20 Records requested from Dr Lindell Noe and [name]. Need records prior to scheduling appt.

## 2022-06-28 ENCOUNTER — Telehealth: Payer: Self-pay | Admitting: Oncology

## 2022-06-28 NOTE — Telephone Encounter (Signed)
Called pt to sch appt per 9/19 staff msg from RN Tammy. No answer. Left msg for pt to call back to sch appt.

## 2022-06-28 NOTE — Telephone Encounter (Signed)
Scheduled appt per 9/19 staff msg from RN Tammy. Pt is aware of appt date and time. Pt is aware to arrive 15 mins prior to appt time and to bring and updated insurance card. Pt is aware of appt location.

## 2022-07-11 ENCOUNTER — Inpatient Hospital Stay: Payer: Medicare PPO | Attending: Oncology | Admitting: Oncology

## 2022-07-11 VITALS — BP 102/46 | HR 80 | Temp 97.9°F | Resp 18 | Ht 63.0 in | Wt 161.7 lb

## 2022-07-11 DIAGNOSIS — D649 Anemia, unspecified: Secondary | ICD-10-CM | POA: Insufficient documentation

## 2022-07-11 DIAGNOSIS — C679 Malignant neoplasm of bladder, unspecified: Secondary | ICD-10-CM | POA: Diagnosis not present

## 2022-07-11 DIAGNOSIS — R42 Dizziness and giddiness: Secondary | ICD-10-CM | POA: Diagnosis not present

## 2022-07-11 DIAGNOSIS — Z79899 Other long term (current) drug therapy: Secondary | ICD-10-CM | POA: Insufficient documentation

## 2022-07-11 DIAGNOSIS — C689 Malignant neoplasm of urinary organ, unspecified: Secondary | ICD-10-CM | POA: Diagnosis not present

## 2022-07-11 NOTE — Progress Notes (Signed)
Hematology and Oncology Follow Up Visit  [name] [id number] 1946/08/28 76 y.o. 07/11/2022 1:51 PM [name], [name], [name], *   Principle Diagnosis: 76 year old woman with ureteral cancer diagnosed in September 2020.  She also found to have high-grade urothelial carcinoma of the distal ureter in 2008.  Prior Therapy:  She is status post right nephroureterectomy for a distal ureteral carcinoma in 2008  She is status post cystoscopy and a stent placement in September 2020 and repeated in February 2021.  She is status post BCG treatment in 2021.  Subsequent cystoscopy in 2022 showed atypia without obvious urothelial carcinoma.  In July 2022 cytology showed high-grade urothelial carcinoma.  She is status post Jelmyto instillation under the care of Dr. Diona Fanti in September 2022 was completed in November 2022.  Following cystoscopy in January 2023 did show some residual tumor by cytology.  In May 2023 she completed repeat Jelmyto infusion.     Current therapy: Intermittent cystoscopy and tumor ablation as well as local treatment by Dr. Diona Fanti.  Interim History: [name] presents today for a repeat evaluation.  Since the last visit, she continues to follow with Dr. Diona Fanti regarding her left upper tract tumor.  She did not develop any systemic disease based on imaging in July 2023.  She was hospitalized in August 2023 for acute kidney injury and underwent cystoscopy and a left double-J stent extraction and a double-J stent exchange.  Since her discharge, she has reported a few complaints including dizziness, lightheadedness and fatigue.  She has reported some flank pain which was improved.     Medications: Updated on review. Current Outpatient Medications  Medication Sig Dispense Refill   acetaminophen (TYLENOL) 500 MG tablet Take 500-1,000 mg by mouth every 6 (six) hours as needed for mild pain.     calcium carbonate (TUMS EX) 750 MG chewable tablet  Chew 1 tablet by mouth every morning.     carvedilol (COREG) 6.25 MG tablet TAKE 1 TABLET(6.25 MG) BY MOUTH TWICE DAILY WITH A MEAL (Patient taking differently: Take 6.25 mg by mouth 2 (two) times daily with a meal.) 180 tablet 2   cholecalciferol (VITAMIN D3) 25 MCG (1000 UNIT) tablet Take 1,000 Units by mouth every morning.     ezetimibe (ZETIA) 10 MG tablet Take 1 tablet (10 mg total) by mouth daily. 90 tablet 3   Ferrous Sulfate (IRON PO) Take 1 tablet by mouth daily.     isosorbide mononitrate (IMDUR) 30 MG 24 hr tablet Take 0.5 tablets (15 mg total) by mouth daily. (Patient taking differently: Take 15 mg by mouth every morning.) 45 tablet 3   ondansetron (ZOFRAN) 8 MG tablet Take 8 mg by mouth 3 (three) times daily as needed for vomiting or nausea.     polyethylene glycol (MIRALAX / GLYCOLAX) 17 g packet Take 17 g by mouth daily. 30 each 0   rosuvastatin (CRESTOR) 40 MG tablet Take 1 tablet (40 mg total) by mouth daily. 90 tablet 3   sertraline (ZOLOFT) 50 MG tablet Take 50 mg by mouth every morning.     traZODone (DESYREL) 150 MG tablet Take 150 mg by mouth at bedtime.     No current facility-administered medications for this visit.     Allergies:  Allergies  Allergen Reactions   Erythromycin Diarrhea and Nausea And Vomiting   Lotensin [Benazepril Hcl] Cough   Nitrofurantoin Other (See Comments)      Physical Exam:  Blood pressure (!) 102/46, pulse 80, temperature 97.9 F (  36.6 C), temperature source Temporal, resp. rate 18, height '5\' 3"'$  (1.6 m), weight 161 lb 11.2 oz (73.3 kg), SpO2 96 %.   ECOG: 1     General appearance: Alert, awake without any distress. Head: Atraumatic without abnormalities Oropharynx: Without any thrush or ulcers. Eyes: No scleral icterus. Lymph nodes: No lymphadenopathy noted in the cervical, supraclavicular, or axillary nodes Heart:regular rate and rhythm, without any murmurs or gallops.   Lung: Clear to auscultation without any rhonchi,  wheezes or dullness to percussion. Abdomin: Soft, nontender without any shifting dullness or ascites. Musculoskeletal: No clubbing or cyanosis. Neurological: No motor or sensory deficits. Skin: No rashes or lesions.      Lab Results: Lab Results  Component Value Date   WBC 10.3 06/10/2022   HGB 9.1 (L) 06/10/2022   HCT 29.2 (L) 06/10/2022   MCV 95.4 06/10/2022   PLT 323 06/10/2022     Chemistry      Component Value Date/Time   NA 138 06/11/2022 0542   NA 141 08/10/2021 0835   K 3.6 06/11/2022 0542   CL 106 06/11/2022 0542   CO2 24 06/11/2022 0542   BUN 32 (H) 06/11/2022 0542   BUN 15 08/10/2021 0835   CREATININE 2.48 (H) 06/11/2022 0542      Component Value Date/Time   CALCIUM 8.6 (L) 06/11/2022 0542   ALKPHOS 85 05/30/2022 1000   AST 29 05/30/2022 1000   ALT 26 05/30/2022 1000   BILITOT 0.6 05/30/2022 1000   BILITOT <0.2 04/21/2021 0809       Impression and Plan: 76 year old woman with:   1.   Urothelial carcinoma of the left upper genitourinary tract tract diagnosed in September 2020.  She was found to have high-grade carcinoma.  She has received local therapy with laser ablation as well as Jelmyto instillation  Her disease status was updated at this time and treatment choices were reviewed.  She had multiple local therapies outlined above.  The role for systemic therapy utilizing Pembrolizumab were discussed at this time and certainly would be an option if she has developed refractory disease without muscle invasion.  After discussion today, she opted to continue with a period of observation and she will have a repeat evaluation with Dr. Diona Fanti in the near future.  If she continues to have persistent disease on her next cystoscopy I would consider Pembrolizumab as a salvage option.  Complications related to therapy including autoimmune considerations, GI toxicity among others.  2.  Anemia: Likely related to renal failure as well as chronic blood loss.  He  received transfusion during her hospitalization.  3.  Dizziness: Likely related to blood pressure medication and currently under evaluation by her primary care as well as nephrology.   4.  Follow-up : We will reevaluate in the next 2 to 3 months.     30  minutes were dedicated to this visit.  The time was spent on updating disease status, treatment choices complications related to her cancer and cancer therapy.  Zola Button, MD 10/3/20231:51 PM

## 2022-07-19 DIAGNOSIS — C652 Malignant neoplasm of left renal pelvis: Secondary | ICD-10-CM | POA: Diagnosis not present

## 2022-07-19 DIAGNOSIS — N3000 Acute cystitis without hematuria: Secondary | ICD-10-CM | POA: Diagnosis not present

## 2022-07-19 DIAGNOSIS — Z8551 Personal history of malignant neoplasm of bladder: Secondary | ICD-10-CM | POA: Diagnosis not present

## 2022-07-19 DIAGNOSIS — C662 Malignant neoplasm of left ureter: Secondary | ICD-10-CM | POA: Diagnosis not present

## 2022-07-21 ENCOUNTER — Encounter: Payer: Self-pay | Admitting: Oncology

## 2022-09-04 ENCOUNTER — Telehealth: Payer: Self-pay | Admitting: Oncology

## 2022-09-04 DIAGNOSIS — C652 Malignant neoplasm of left renal pelvis: Secondary | ICD-10-CM | POA: Diagnosis not present

## 2022-09-04 NOTE — Telephone Encounter (Signed)
Called patient regarding upcoming December appointments, left a voicemail. 

## 2022-09-06 DIAGNOSIS — N3000 Acute cystitis without hematuria: Secondary | ICD-10-CM | POA: Diagnosis not present

## 2022-09-06 DIAGNOSIS — C662 Malignant neoplasm of left ureter: Secondary | ICD-10-CM | POA: Diagnosis not present

## 2022-09-06 DIAGNOSIS — C652 Malignant neoplasm of left renal pelvis: Secondary | ICD-10-CM | POA: Diagnosis not present

## 2022-09-13 ENCOUNTER — Inpatient Hospital Stay: Payer: Medicare PPO | Attending: Oncology | Admitting: Oncology

## 2022-09-13 ENCOUNTER — Telehealth: Payer: Self-pay | Admitting: Hematology

## 2022-09-13 VITALS — BP 122/65 | HR 82 | Temp 97.8°F | Resp 16 | Ht 63.0 in | Wt 165.2 lb

## 2022-09-13 DIAGNOSIS — C689 Malignant neoplasm of urinary organ, unspecified: Secondary | ICD-10-CM

## 2022-09-13 DIAGNOSIS — Z79899 Other long term (current) drug therapy: Secondary | ICD-10-CM | POA: Insufficient documentation

## 2022-09-13 DIAGNOSIS — C679 Malignant neoplasm of bladder, unspecified: Secondary | ICD-10-CM | POA: Diagnosis not present

## 2022-09-13 DIAGNOSIS — D649 Anemia, unspecified: Secondary | ICD-10-CM | POA: Insufficient documentation

## 2022-09-13 DIAGNOSIS — N189 Chronic kidney disease, unspecified: Secondary | ICD-10-CM | POA: Diagnosis not present

## 2022-09-13 NOTE — Telephone Encounter (Signed)
Patient requested to be scheduled with provider at [facility]. Paired patient with new provider and scheduled.

## 2022-09-13 NOTE — Progress Notes (Signed)
Hematology and Oncology Follow Up Visit  [name] [id number] [birth year] 76 y.o. 09/13/2022 8:46 AM [name], MDTimberlake, [name], *   Principle Diagnosis: 76 year old woman with left ureteral cancer diagnosed in September 2020.  She was found to have high-grade urothelial carcinoma without any clear-cut muscle invasion.   Secondary diagnoses: high-grade urothelial carcinoma of the right distal ureter in 2008.  Prior Therapy:  She is status post right nephroureterectomy for a distal ureteral carcinoma in 2008  She is status post cystoscopy and a stent placement in September 2020 and repeated in February 2021 for a left ureteral tumor.  The cytology showed high-grade urothelial carcinoma.  She is status post BCG treatment in 2021.  Subsequent cystoscopy in 2022 showed atypia without obvious urothelial carcinoma.  In July 2022 cytology showed high-grade urothelial carcinoma.  She is status post Jelmyto instillation under the care of Dr. Diona Fanti in September 2022 was completed in November 2022.  Following cystoscopy in January 2023 did show some residual tumor by cytology.  In May 2023 she completed repeat Jelmyto infusion.     Current therapy: Intermittent cystoscopy and tumor ablation.  Under consideration for systemic therapy.   Interim History: [name] returns today for a follow-up visit.  Since the last visit, she reports no major changes in her health.  She continues to have improvement in her strength and energy. She does have occasional dizziness but no syncope or falls.  Her appetite and performance status continues to improve.  She denies any hematuria, dysuria or flank pain.     Medications: Reviewed without changes. Current Outpatient Medications  Medication Sig Dispense Refill   acetaminophen (TYLENOL) 500 MG tablet Take 500-1,000 mg by mouth every 6 (six) hours as needed for mild pain.     calcium carbonate (TUMS EX) 750 MG chewable tablet  Chew 1 tablet by mouth every morning.     carvedilol (COREG) 6.25 MG tablet TAKE 1 TABLET(6.25 MG) BY MOUTH TWICE DAILY WITH A MEAL (Patient taking differently: Take 6.25 mg by mouth 2 (two) times daily with a meal.) 180 tablet 2   cholecalciferol (VITAMIN D3) 25 MCG (1000 UNIT) tablet Take 1,000 Units by mouth every morning.     ezetimibe (ZETIA) 10 MG tablet Take 1 tablet (10 mg total) by mouth daily. 90 tablet 3   Ferrous Sulfate (IRON PO) Take 1 tablet by mouth daily.     isosorbide mononitrate (IMDUR) 30 MG 24 hr tablet Take 0.5 tablets (15 mg total) by mouth daily. (Patient taking differently: Take 15 mg by mouth every morning.) 45 tablet 3   methenamine (MANDELAMINE) 0.5 GM tablet SMARTSIG:1 Tablet(s) By Mouth Every 12 Hours     ondansetron (ZOFRAN) 8 MG tablet Take 8 mg by mouth 3 (three) times daily as needed for vomiting or nausea.     polyethylene glycol (MIRALAX / GLYCOLAX) 17 g packet Take 17 g by mouth daily. 30 each 0   rosuvastatin (CRESTOR) 40 MG tablet Take 1 tablet (40 mg total) by mouth daily. 90 tablet 3   sertraline (ZOLOFT) 50 MG tablet Take 50 mg by mouth every morning.     traZODone (DESYREL) 150 MG tablet Take 150 mg by mouth at bedtime.     No current facility-administered medications for this visit.     Allergies:  Allergies  Allergen Reactions   Erythromycin Diarrhea and Nausea And Vomiting   Lotensin [Benazepril Hcl] Cough   Nitrofurantoin Other (See Comments)      Physical  Exam:     ECOG: 1    General appearance: Comfortable appearing without any discomfort Head: Normocephalic without any trauma Oropharynx: Mucous membranes are moist and pink without any thrush or ulcers. Eyes: Pupils are equal and round reactive to light. Lymph nodes: No cervical, supraclavicular, inguinal or axillary lymphadenopathy.   Heart:regular rate and rhythm.  S1 and S2 without leg edema. Lung: Clear without any rhonchi or wheezes.  No dullness to percussion. Abdomin:  Soft, nontender, nondistended with good bowel sounds.  No hepatosplenomegaly. Musculoskeletal: No joint deformity or effusion.  Full range of motion noted. Neurological: No deficits noted on motor, sensory and deep tendon reflex exam. Skin: No petechial rash or dryness.  Appeared moist.       Lab Results: Lab Results  Component Value Date   WBC 10.3 06/10/2022   HGB 9.1 (L) 06/10/2022   HCT 29.2 (L) 06/10/2022   MCV 95.4 06/10/2022   PLT 323 06/10/2022     Chemistry      Component Value Date/Time   NA 138 06/11/2022 0542   NA 141 08/10/2021 0835   K 3.6 06/11/2022 0542   CL 106 06/11/2022 0542   CO2 24 06/11/2022 0542   BUN 32 (H) 06/11/2022 0542   BUN 15 08/10/2021 0835   CREATININE 2.48 (H) 06/11/2022 0542      Component Value Date/Time   CALCIUM 8.6 (L) 06/11/2022 0542   ALKPHOS 85 05/30/2022 1000   AST 29 05/30/2022 1000   ALT 26 05/30/2022 1000   BILITOT 0.6 05/30/2022 1000   BILITOT <0.2 04/21/2021 0809       Impression and Plan:   76 year old woman with:   1.   Left ureteral cancer diagnosed in September 2020.her tumor appears to be superficial although no documentation of muscle sampling on cytology.   She is currently receiving local therapy with a repeat cystoscopy and ablation.  The role for systemic therapy at this time unclear.  Extrapolation from the bladder data to use Pembrolizumab or noninvasive bladder tumor could be considered.  I would defer this option unless she develops refractory disease.  Complication associated with this therapy were discussed today in detail.  These include GI toxicity, dermatological issues and autoimmune complications.  She is scheduled to have repeat cystoscopy, ureteroscopy and stent placement in 2024 under the care of Dr. Diona Fanti.  She has recurrent refractory disease, embryo could be considered at that time.   2.  Anemia: Appears to be improving at this time with her symptoms of dizziness and fatigue is  improved.  3.  Chronic renal insufficiency: Creatinine clearance is around 20 cc/min likely contributing to her anemia.  4.  Follow-up : In the next 2 to 3 months for repeat evaluation.     30  minutes were spent on this encounter.  The time was dedicated to reviewing her disease status, treatment choices and complication related to therapy.  Zola Button, MD 12/6/20238:46 AM

## 2022-09-20 DIAGNOSIS — R42 Dizziness and giddiness: Secondary | ICD-10-CM | POA: Diagnosis not present

## 2022-09-21 ENCOUNTER — Telehealth: Payer: Self-pay | Admitting: Internal Medicine

## 2022-09-21 NOTE — Telephone Encounter (Signed)
Pt c/o medication issue:  1. Name of Medication:  isosorbide mononitrate (IMDUR) 30 MG 24 hr tablet   2. How are you currently taking this medication (dosage and times per day)? N/A  3. Are you having a reaction (difficulty breathing--STAT)? Yes  4. What is your medication issue? Enid Derry is calling from [place] at  [place] stating the patient was seen by Dr. Lindell Noe yesterday and was positive for orthostatic BP. She also reports the pt was dizzy/lightheaded. Dr. Lindell Noe is wanting Dr. Oralia Rud opinion on stopping this medication to see if it helps with symptoms. OV notes have been faxed to main fax #. Please advise.

## 2022-09-21 NOTE — Telephone Encounter (Signed)
Heather Lean, MD  You; Vergia Alcon A, RN4 hours ago (12:34 PM)   [facility] OK to stop.  This would decrease her LVOT gradient as well.    Left the pt a message to call the office back to endorse to her that per Dr. Gasper Sells, she can stop taking her Imdur due to orthostatic hypotension issues her PCP called into the office  about today.  Left her a message to call our office back tomorrow and request to speak with a triage nurse about this.

## 2022-09-21 NOTE — Telephone Encounter (Signed)
Will forward this information to Dr. Gasper Sells and covering RN, so that he can further advise on this matter, at the request of the pts PCP.

## 2022-09-22 NOTE — Telephone Encounter (Signed)
Spoke with patient to advise it is ok to stop taking Imdur per Dr. Gasper Sells. Patient verbalized understanding.

## 2022-09-22 NOTE — Telephone Encounter (Signed)
Patient returning call.

## 2022-10-16 DIAGNOSIS — N1832 Chronic kidney disease, stage 3b: Secondary | ICD-10-CM | POA: Diagnosis not present

## 2022-10-16 DIAGNOSIS — N189 Chronic kidney disease, unspecified: Secondary | ICD-10-CM | POA: Diagnosis not present

## 2022-10-24 DIAGNOSIS — N184 Chronic kidney disease, stage 4 (severe): Secondary | ICD-10-CM | POA: Diagnosis not present

## 2022-10-24 DIAGNOSIS — D631 Anemia in chronic kidney disease: Secondary | ICD-10-CM | POA: Diagnosis not present

## 2022-10-24 DIAGNOSIS — C689 Malignant neoplasm of urinary organ, unspecified: Secondary | ICD-10-CM | POA: Diagnosis not present

## 2022-10-24 DIAGNOSIS — I129 Hypertensive chronic kidney disease with stage 1 through stage 4 chronic kidney disease, or unspecified chronic kidney disease: Secondary | ICD-10-CM | POA: Diagnosis not present

## 2022-10-24 DIAGNOSIS — N179 Acute kidney failure, unspecified: Secondary | ICD-10-CM | POA: Diagnosis not present

## 2022-10-24 DIAGNOSIS — N2581 Secondary hyperparathyroidism of renal origin: Secondary | ICD-10-CM | POA: Diagnosis not present

## 2022-10-27 ENCOUNTER — Other Ambulatory Visit: Payer: Self-pay | Admitting: Urology

## 2022-11-07 DIAGNOSIS — N3000 Acute cystitis without hematuria: Secondary | ICD-10-CM | POA: Diagnosis not present

## 2022-11-09 ENCOUNTER — Other Ambulatory Visit: Payer: Self-pay | Admitting: Internal Medicine

## 2022-11-10 ENCOUNTER — Encounter (HOSPITAL_BASED_OUTPATIENT_CLINIC_OR_DEPARTMENT_OTHER): Payer: Self-pay | Admitting: Urology

## 2022-11-10 NOTE — Progress Notes (Addendum)
Spoke w/ via phone for pre-op interview--- pt Lab needs dos----  [employer] results------ current EKG in epic/ chart COVID test -----patient states asymptomatic no test needed Arrive at ------- 0530 on 11-10-2022 NPO after MN NO Solid Food.  Clear liquids from MN until--- 0430 Med rec completed Medications to take morning of surgery ----- coreg, zoloft, crestor, oxybutynin, methenamine Diabetic medication ----- n/a Patient instructed no nail polish to be worn day of surgery Patient instructed to bring photo id and insurance card day of surgery Patient aware to have Driver (ride ) / caregiver    for 24 hours after surgery -- husband, [place] Patient Special Instructions ----- n/a Pre-Op special Istructions ----- n/a Patient verbalized understanding of instructions that were given at this phone interview. Patient denies shortness of breath, chest pain, fever, cough at this phone interview.   Anesthesia Review: HTN;  chronic combined CHF normal ef;  moderate nonobstructive CAD;  s/p right nephroureterectomy 2008 for right ureter cancer;  recurrent left ureter cancer Pt denies cardiac s&s, sob, and no peripheral swelling  PCP:  Dr Lindell Noe Cardiologist : Dr Jerilynn Mages. Chandrasekhar (lov 12-21-2021) Oncologist:  Dr Alen Blew [facility] 09-13-2022) Chest x-ray : 06-05-2022 EKG : 05-30-2022 Echo : 06-01-2022 Stress test: no Cardiac Cath : 11-06-2019 Activity level: denies sob w/ any activity  Blood Thinner/ Instructions /Last Dose:no ASA / Instructions/ Last Dose : no

## 2022-11-15 NOTE — Anesthesia Preprocedure Evaluation (Signed)
Anesthesia Evaluation  Patient identified by MRN, date of birth, ID band Patient awake    Reviewed: Allergy & Precautions, NPO status , Patient's Chart, lab work & pertinent test results  History of Anesthesia Complications Negative for: history of anesthetic complications  Airway Mallampati: II  TM Distance: >3 FB Neck ROM: Full    Dental no notable dental hx. (+) Dental Advisory Given   Pulmonary former smoker, PE (hx off eliquis due to Hematuria)   Pulmonary exam normal        Cardiovascular hypertension, Pt. on home beta blockers + CAD and +CHF  Normal cardiovascular exam+ Valvular Problems/Murmurs MR   06/01/22 echo 1. Left ventricular ejection fraction, by estimation, is 60 to 65%. Left  ventricular ejection fraction by 3D volume is 63 %. The left ventricle has  normal function. The left ventricle has no regional wall motion  abnormalities. Left ventricular diastolic  parameters are consistent with Grade I diastolic dysfunction (impaired  relaxation).  2. Right ventricular systolic function is normal. The right ventricular  size is normal. There is normal pulmonary artery systolic pressure. The  estimated right ventricular systolic pressure is 02.7 mmHg.  3. The mitral valve is grossly normal. Mild mitral valve regurgitation.  4. The aortic valve is tricuspid. There is mild calcification of the  aortic valve. There is mild thickening of the aortic valve. Aortic valve  regurgitation is not visualized. Aortic valve sclerosis/calcification is  present, without any evidence of  aortic stenosis.  5. There is Severe (Grade IV) plaque involving the ascending aorta.  6. The inferior vena cava is normal in size with <50% respiratory  variability, suggesting right atrial pressure of 8 mmHg.    Neuro/Psych  PSYCHIATRIC DISORDERS Anxiety Depression       GI/Hepatic ,GERD  ,,  Endo/Other    Renal/GU CRF and Renal  InsufficiencyRenal disease   Urothelial cancer    Musculoskeletal   Abdominal   Peds  Hematology  (+) Blood dyscrasia, anemia Lab Results      Component                Value               Date                      WBC                      11.8 (H)            06/09/2022                HGB                      8.8 (L)             06/09/2022                HCT                      29.0 (L)            06/09/2022               PLT                      290                 06/09/2022  Anesthesia Other Findings All: Erythromycin, Benazapril, nitrofurantoin  Reproductive/Obstetrics                             Anesthesia Physical Anesthesia Plan  ASA: 3  Anesthesia Plan: General   Post-op Pain Management: Minimal or no pain anticipated and Tylenol PO (pre-op)*   Induction: Intravenous  PONV Risk Score and Plan: 4 or greater and Treatment may vary due to age or medical condition, Ondansetron, Dexamethasone and Diphenhydramine  Airway Management Planned: LMA  Additional Equipment: None  Intra-op Plan:   Post-operative Plan: Extubation in OR  Informed Consent: I have reviewed the patients History and Physical, chart, labs and discussed the procedure including the risks, benefits and alternatives for the proposed anesthesia with the patient or authorized representative who has indicated his/her understanding and acceptance.     Dental advisory given  Plan Discussed with: Anesthesiologist and CRNA  Anesthesia Plan Comments:         Anesthesia Quick Evaluation

## 2022-11-16 ENCOUNTER — Ambulatory Visit (HOSPITAL_BASED_OUTPATIENT_CLINIC_OR_DEPARTMENT_OTHER): Payer: Medicare PPO | Admitting: Anesthesiology

## 2022-11-16 ENCOUNTER — Other Ambulatory Visit: Payer: Self-pay

## 2022-11-16 ENCOUNTER — Observation Stay (HOSPITAL_BASED_OUTPATIENT_CLINIC_OR_DEPARTMENT_OTHER)
Admission: RE | Admit: 2022-11-16 | Discharge: 2022-11-17 | Disposition: A | Discharge status: Home / Self Care | Payer: Medicare PPO | Attending: Urology | Admitting: Urology

## 2022-11-16 ENCOUNTER — Encounter (HOSPITAL_BASED_OUTPATIENT_CLINIC_OR_DEPARTMENT_OTHER): Payer: Self-pay | Admitting: Urology

## 2022-11-16 ENCOUNTER — Encounter (HOSPITAL_COMMUNITY)
Admission: RE | Disposition: A | Discharge status: Home / Self Care | Payer: Self-pay | Source: Home / Self Care | Attending: Urology

## 2022-11-16 DIAGNOSIS — I130 Hypertensive heart and chronic kidney disease with heart failure and stage 1 through stage 4 chronic kidney disease, or unspecified chronic kidney disease: Secondary | ICD-10-CM | POA: Insufficient documentation

## 2022-11-16 DIAGNOSIS — Z79899 Other long term (current) drug therapy: Secondary | ICD-10-CM | POA: Diagnosis not present

## 2022-11-16 DIAGNOSIS — Z8554 Personal history of malignant neoplasm of ureter: Secondary | ICD-10-CM

## 2022-11-16 DIAGNOSIS — I5042 Chronic combined systolic (congestive) and diastolic (congestive) heart failure: Secondary | ICD-10-CM | POA: Insufficient documentation

## 2022-11-16 DIAGNOSIS — C651 Malignant neoplasm of right renal pelvis: Secondary | ICD-10-CM | POA: Diagnosis not present

## 2022-11-16 DIAGNOSIS — I2510 Atherosclerotic heart disease of native coronary artery without angina pectoris: Secondary | ICD-10-CM | POA: Diagnosis not present

## 2022-11-16 DIAGNOSIS — N390 Urinary tract infection, site not specified: Secondary | ICD-10-CM | POA: Diagnosis not present

## 2022-11-16 DIAGNOSIS — C669 Malignant neoplasm of unspecified ureter: Secondary | ICD-10-CM | POA: Diagnosis not present

## 2022-11-16 DIAGNOSIS — Z87891 Personal history of nicotine dependence: Secondary | ICD-10-CM

## 2022-11-16 DIAGNOSIS — F418 Other specified anxiety disorders: Secondary | ICD-10-CM

## 2022-11-16 DIAGNOSIS — Q600 Renal agenesis, unilateral: Secondary | ICD-10-CM

## 2022-11-16 DIAGNOSIS — N281 Cyst of kidney, acquired: Secondary | ICD-10-CM

## 2022-11-16 DIAGNOSIS — C659 Malignant neoplasm of unspecified renal pelvis: Secondary | ICD-10-CM | POA: Diagnosis present

## 2022-11-16 DIAGNOSIS — D631 Anemia in chronic kidney disease: Secondary | ICD-10-CM | POA: Diagnosis not present

## 2022-11-16 DIAGNOSIS — C679 Malignant neoplasm of bladder, unspecified: Secondary | ICD-10-CM | POA: Diagnosis not present

## 2022-11-16 DIAGNOSIS — Z905 Acquired absence of kidney: Secondary | ICD-10-CM | POA: Diagnosis not present

## 2022-11-16 DIAGNOSIS — C662 Malignant neoplasm of left ureter: Secondary | ICD-10-CM | POA: Diagnosis not present

## 2022-11-16 DIAGNOSIS — D630 Anemia in neoplastic disease: Secondary | ICD-10-CM | POA: Diagnosis not present

## 2022-11-16 DIAGNOSIS — Z01818 Encounter for other preprocedural examination: Secondary | ICD-10-CM

## 2022-11-16 DIAGNOSIS — I509 Heart failure, unspecified: Secondary | ICD-10-CM | POA: Diagnosis not present

## 2022-11-16 DIAGNOSIS — N183 Chronic kidney disease, stage 3 unspecified: Secondary | ICD-10-CM | POA: Diagnosis not present

## 2022-11-16 DIAGNOSIS — N189 Chronic kidney disease, unspecified: Secondary | ICD-10-CM | POA: Diagnosis not present

## 2022-11-16 HISTORY — DX: Mixed hyperlipidemia: E78.2

## 2022-11-16 HISTORY — DX: Chronic kidney disease, stage 3 unspecified: N18.30

## 2022-11-16 HISTORY — PX: CYSTOSCOPY W/ URETERAL STENT REMOVAL: SHX1430

## 2022-11-16 HISTORY — PX: HOLMIUM LASER APPLICATION: SHX5852

## 2022-11-16 HISTORY — DX: Anemia in chronic kidney disease: D63.1

## 2022-11-16 HISTORY — DX: Generalized anxiety disorder: F41.1

## 2022-11-16 HISTORY — DX: Chronic kidney disease, unspecified: N18.9

## 2022-11-16 HISTORY — PX: CYSTOSCOPY WITH RETROGRADE PYELOGRAM, URETEROSCOPY AND STENT PLACEMENT: SHX5789

## 2022-11-16 LAB — POCT I-STAT, CHEM 8
BUN: 30 mg/dL — ABNORMAL HIGH (ref 8–23)
Calcium, Ion: 1.25 mmol/L (ref 1.15–1.40)
Chloride: 103 mmol/L (ref 98–111)
Creatinine, Ser: 2.9 mg/dL — ABNORMAL HIGH (ref 0.44–1.00)
Glucose, Bld: 100 mg/dL — ABNORMAL HIGH (ref 70–99)
HCT: 34 % — ABNORMAL LOW (ref 36.0–46.0)
Hemoglobin: 11.6 g/dL — ABNORMAL LOW (ref 12.0–15.0)
Potassium: 4 mmol/L (ref 3.5–5.1)
Sodium: 140 mmol/L (ref 135–145)
TCO2: 25 mmol/L (ref 22–32)

## 2022-11-16 LAB — CBC
HCT: 32.2 % — ABNORMAL LOW (ref 36.0–46.0)
Hemoglobin: 10 g/dL — ABNORMAL LOW (ref 12.0–15.0)
MCH: 29.2 pg (ref 26.0–34.0)
MCHC: 31.1 g/dL (ref 30.0–36.0)
MCV: 94.2 fL (ref 80.0–100.0)
Platelets: 220 10*3/uL (ref 150–400)
RBC: 3.42 MIL/uL — ABNORMAL LOW (ref 3.87–5.11)
RDW: 15.3 % (ref 11.5–15.5)
WBC: 9.8 10*3/uL (ref 4.0–10.5)
nRBC: 0 % (ref 0.0–0.2)

## 2022-11-16 LAB — CREATININE, SERUM
Creatinine, Ser: 2.48 mg/dL — ABNORMAL HIGH (ref 0.44–1.00)
GFR, Estimated: 20 mL/min — ABNORMAL LOW (ref >60)

## 2022-11-16 SURGERY — CYSTOURETEROSCOPY, WITH RETROGRADE PYELOGRAM AND STENT INSERTION
Anesthesia: General | Laterality: Left

## 2022-11-16 MED ORDER — MIDAZOLAM HCL 2 MG/2ML IJ SOLN
INTRAMUSCULAR | Status: AC
  Filled 2022-11-16: qty 2

## 2022-11-16 MED ORDER — ROSUVASTATIN CALCIUM 20 MG PO TABS
40.0000 mg | ORAL_TABLET | Freq: Every day | ORAL | Status: DC
  Administered 2022-11-16 – 2022-11-17 (×2): 40 mg via ORAL
  Filled 2022-11-16 (×2): qty 2

## 2022-11-16 MED ORDER — LIDOCAINE 2% (20 MG/ML) 5 ML SYRINGE
INTRAMUSCULAR | Status: DC | PRN
  Administered 2022-11-16: 60 mg via INTRAVENOUS

## 2022-11-16 MED ORDER — TRAZODONE HCL 50 MG PO TABS
150.0000 mg | ORAL_TABLET | Freq: Every day | ORAL | Status: DC
  Administered 2022-11-16: 150 mg via ORAL
  Filled 2022-11-16: qty 3

## 2022-11-16 MED ORDER — FENTANYL CITRATE (PF) 100 MCG/2ML IJ SOLN: 25.0000 ug | INTRAMUSCULAR | Status: DC | PRN

## 2022-11-16 MED ORDER — ACETAMINOPHEN 325 MG PO TABS
650.0000 mg | ORAL_TABLET | ORAL | Status: DC | PRN
  Administered 2022-11-16: 650 mg via ORAL
  Filled 2022-11-16: qty 2

## 2022-11-16 MED ORDER — IOHEXOL 300 MG/ML  SOLN
INTRAMUSCULAR | Status: DC | PRN
  Administered 2022-11-16: 10 mL via URETHRAL

## 2022-11-16 MED ORDER — FENTANYL CITRATE (PF) 100 MCG/2ML IJ SOLN
INTRAMUSCULAR | Status: AC
  Filled 2022-11-16: qty 2

## 2022-11-16 MED ORDER — ROCURONIUM BROMIDE 10 MG/ML (PF) SYRINGE
PREFILLED_SYRINGE | INTRAVENOUS | Status: AC
  Filled 2022-11-16: qty 10

## 2022-11-16 MED ORDER — GLYCOPYRROLATE 0.2 MG/ML IJ SOLN
INTRAMUSCULAR | Status: DC | PRN
  Administered 2022-11-16: .2 mg via INTRAVENOUS

## 2022-11-16 MED ORDER — SODIUM CHLORIDE 0.45 % IV SOLN: INTRAVENOUS | Status: DC

## 2022-11-16 MED ORDER — ONDANSETRON HCL 4 MG/2ML IJ SOLN
INTRAMUSCULAR | Status: DC | PRN
  Administered 2022-11-16: 4 mg via INTRAVENOUS

## 2022-11-16 MED ORDER — CEFAZOLIN SODIUM-DEXTROSE 2-4 GM/100ML-% IV SOLN
2.0000 g | INTRAVENOUS | Status: AC
  Administered 2022-11-16: 2 g via INTRAVENOUS

## 2022-11-16 MED ORDER — CEFAZOLIN SODIUM-DEXTROSE 2-4 GM/100ML-% IV SOLN
INTRAVENOUS | Status: AC
  Filled 2022-11-16: qty 100

## 2022-11-16 MED ORDER — MIDAZOLAM HCL 2 MG/2ML IJ SOLN
INTRAMUSCULAR | Status: DC | PRN
  Administered 2022-11-16: 1 mg via INTRAVENOUS

## 2022-11-16 MED ORDER — AMISULPRIDE (ANTIEMETIC) 5 MG/2ML IV SOLN: 10.0000 mg | Freq: Once | INTRAVENOUS | Status: DC | PRN

## 2022-11-16 MED ORDER — EPHEDRINE 5 MG/ML INJ
INTRAVENOUS | Status: AC
  Filled 2022-11-16: qty 5

## 2022-11-16 MED ORDER — CALCIUM CARBONATE ANTACID 500 MG PO CHEW
750.0000 mg | CHEWABLE_TABLET | Freq: Every day | ORAL | Status: DC | PRN
  Administered 2022-11-16: 750 mg via ORAL
  Filled 2022-11-16 (×2): qty 4

## 2022-11-16 MED ORDER — DEXAMETHASONE SODIUM PHOSPHATE 10 MG/ML IJ SOLN
INTRAMUSCULAR | Status: AC
  Filled 2022-11-16: qty 2

## 2022-11-16 MED ORDER — CARVEDILOL 6.25 MG PO TABS
6.2500 mg | ORAL_TABLET | Freq: Two times a day (BID) | ORAL | Status: DC
  Administered 2022-11-16 – 2022-11-17 (×2): 6.25 mg via ORAL
  Filled 2022-11-16 (×2): qty 1

## 2022-11-16 MED ORDER — PROPOFOL 10 MG/ML IV BOLUS
INTRAVENOUS | Status: AC
  Filled 2022-11-16: qty 20

## 2022-11-16 MED ORDER — LIDOCAINE HCL (PF) 2 % IJ SOLN
INTRAMUSCULAR | Status: AC
  Filled 2022-11-16: qty 10

## 2022-11-16 MED ORDER — ACETAMINOPHEN 500 MG PO TABS
ORAL_TABLET | ORAL | Status: AC
  Filled 2022-11-16: qty 2

## 2022-11-16 MED ORDER — ENOXAPARIN SODIUM 30 MG/0.3ML IJ SOSY
30.0000 mg | PREFILLED_SYRINGE | INTRAMUSCULAR | Status: DC
  Administered 2022-11-16: 30 mg via SUBCUTANEOUS
  Filled 2022-11-16: qty 0.3

## 2022-11-16 MED ORDER — SODIUM CHLORIDE 0.9 % IV SOLN
INTRAVENOUS | Status: DC
Start: 2022-11-16 — End: 2022-11-16

## 2022-11-16 MED ORDER — SERTRALINE HCL 50 MG PO TABS
50.0000 mg | ORAL_TABLET | Freq: Every morning | ORAL | Status: DC
  Administered 2022-11-16 – 2022-11-17 (×2): 50 mg via ORAL
  Filled 2022-11-16 (×2): qty 1

## 2022-11-16 MED ORDER — OXYBUTYNIN CHLORIDE 5 MG PO TABS: 5.0000 mg | ORAL_TABLET | Freq: Three times a day (TID) | ORAL | Status: DC | PRN

## 2022-11-16 MED ORDER — ONDANSETRON HCL 4 MG/2ML IJ SOLN
INTRAMUSCULAR | Status: AC
  Filled 2022-11-16: qty 4

## 2022-11-16 MED ORDER — FLUCONAZOLE 100 MG PO TABS
100.0000 mg | ORAL_TABLET | Freq: Every day | ORAL | Status: DC
  Administered 2022-11-16 – 2022-11-17 (×2): 100 mg via ORAL
  Filled 2022-11-16 (×2): qty 1

## 2022-11-16 MED ORDER — FENTANYL CITRATE (PF) 250 MCG/5ML IJ SOLN
INTRAMUSCULAR | Status: DC | PRN
  Administered 2022-11-16: 25 ug via INTRAVENOUS
  Administered 2022-11-16: 50 ug via INTRAVENOUS

## 2022-11-16 MED ORDER — SUCCINYLCHOLINE CHLORIDE 200 MG/10ML IV SOSY
PREFILLED_SYRINGE | INTRAVENOUS | Status: AC
  Filled 2022-11-16: qty 10

## 2022-11-16 MED ORDER — SODIUM CHLORIDE 0.9 % IR SOLN
Status: DC | PRN
  Administered 2022-11-16: 3000 mL via INTRAVESICAL

## 2022-11-16 MED ORDER — SODIUM CHLORIDE 0.9 % IV SOLN
1.0000 g | INTRAVENOUS | Status: DC
  Administered 2022-11-16: 1 g via INTRAVENOUS
  Filled 2022-11-16: qty 10

## 2022-11-16 MED ORDER — DEXAMETHASONE SODIUM PHOSPHATE 10 MG/ML IJ SOLN
INTRAMUSCULAR | Status: DC | PRN
  Administered 2022-11-16: 5 mg via INTRAVENOUS

## 2022-11-16 MED ORDER — PROMETHAZINE HCL 25 MG/ML IJ SOLN: 6.2500 mg | INTRAMUSCULAR | Status: DC | PRN

## 2022-11-16 MED ORDER — PROPOFOL 10 MG/ML IV BOLUS
INTRAVENOUS | Status: DC | PRN
  Administered 2022-11-16: 100 mg via INTRAVENOUS

## 2022-11-16 MED ORDER — ACETAMINOPHEN 500 MG PO TABS
1000.0000 mg | ORAL_TABLET | Freq: Once | ORAL | Status: AC
  Administered 2022-11-16: 1000 mg via ORAL

## 2022-11-16 MED ORDER — EPHEDRINE SULFATE-NACL 50-0.9 MG/10ML-% IV SOSY
PREFILLED_SYRINGE | INTRAVENOUS | Status: DC | PRN
  Administered 2022-11-16: 10 mg via INTRAVENOUS
  Administered 2022-11-16: 5 mg via INTRAVENOUS

## 2022-11-16 SURGICAL SUPPLY — 24 items
BAG DRAIN URO-CYSTO SKYTR STRL (DRAIN) ×1 IMPLANT
BASKET ZERO TIP NITINOL 2.4FR (BASKET) IMPLANT
CATH URETL OPEN END 6FR 70 (CATHETERS) ×1 IMPLANT
CLOTH BEACON ORANGE TIMEOUT ST (SAFETY) ×1 IMPLANT
COVER DOME SNAP 22 D (MISCELLANEOUS) ×1 IMPLANT
ELECT REM PT RETURN 9FT ADLT (ELECTROSURGICAL)
ELECTRODE REM PT RTRN 9FT ADLT (ELECTROSURGICAL) IMPLANT
FIBER LASER FLEXIVA 200 (UROLOGICAL SUPPLIES) IMPLANT
FIBER LASER FLEXIVA 365 (UROLOGICAL SUPPLIES) IMPLANT
GLOVE BIO SURGEON STRL SZ8 (GLOVE) ×1 IMPLANT
GOWN STRL REUS W/TWL XL LVL3 (GOWN DISPOSABLE) ×1 IMPLANT
GUIDEWIRE ANG ZIPWIRE 038X150 (WIRE) IMPLANT
GUIDEWIRE STR DUAL SENSOR (WIRE) IMPLANT
IV NS IRRIG 3000ML ARTHROMATIC (IV SOLUTION) ×2 IMPLANT
KIT TURNOVER CYSTO (KITS) ×1 IMPLANT
MANIFOLD NEPTUNE II (INSTRUMENTS) ×1 IMPLANT
NS IRRIG 500ML POUR BTL (IV SOLUTION) ×1 IMPLANT
PACK CYSTO (CUSTOM PROCEDURE TRAY) ×1 IMPLANT
SHEATH NAVIGATOR HD 11/13X36 (SHEATH) IMPLANT
STENT URET 6FRX24 CONTOUR (STENTS) IMPLANT
TRACTIP FLEXIVA PULS ID 200XHI (Laser) IMPLANT
TRACTIP FLEXIVA PULSE ID 200 (Laser)
TUBE CONNECTING 12X1/4 (SUCTIONS) IMPLANT
TUBING UROLOGY SET (TUBING) IMPLANT

## 2022-11-16 NOTE — Anesthesia Postprocedure Evaluation (Signed)
Anesthesia Post Note  Patient: [name]  Procedure(s) Performed: CYSTOSCOPY WITH RETROGRADE PYELOGRAM, URETEROSCOPY AND STENT REPLACEMENT (Left) HOLMIUM LASER OF TUMORS (Left) CYSTOSCOPY WITH STENT REMOVAL (Left)     Patient location during evaluation: PACU Anesthesia Type: General Level of consciousness: sedated Pain management: pain level controlled Vital Signs Assessment: post-procedure vital signs reviewed and stable Respiratory status: spontaneous breathing and respiratory function stable Cardiovascular status: stable Postop Assessment: no apparent nausea or vomiting Anesthetic complications: no   No notable events documented.  Last Vitals:  Vitals:   11/16/22 0930 11/16/22 0946  BP: 135/65 (!) 146/67  Pulse: 66 69  Resp: 13 20  Temp: 36.6 C 36.6 C  SpO2: 97% 93%    Last Pain:  Vitals:   11/16/22 0946  TempSrc: Oral  PainSc:                  [name]

## 2022-11-16 NOTE — Anesthesia Procedure Notes (Signed)
Procedure Name: LMA Insertion Date/Time: 11/16/2022 7:42 AM  Performed by: Clearnce Sorrel, CRNAPre-anesthesia Checklist: Patient identified, Emergency Drugs available, Suction available and Patient being monitored Patient Re-evaluated:Patient Re-evaluated prior to induction Oxygen Delivery Method: Circle System Utilized Preoxygenation: Pre-oxygenation with 100% oxygen Induction Type: IV induction Ventilation: Mask ventilation without difficulty LMA: LMA inserted LMA Size: 4.0 Number of attempts: 1 Airway Equipment and Method: Bite block Placement Confirmation: positive ETCO2 Tube secured with: Tape Dental Injury: Teeth and Oropharynx as per pre-operative assessment

## 2022-11-16 NOTE — Transfer of Care (Signed)
Immediate Anesthesia Transfer of Care Note  Patient: [name]  Procedure(s) Performed: CYSTOSCOPY WITH RETROGRADE PYELOGRAM, URETEROSCOPY AND STENT REPLACEMENT (Left) HOLMIUM LASER OF TUMORS (Left) CYSTOSCOPY WITH STENT REMOVAL (Left)  Patient Location: PACU  Anesthesia Type:General  Level of Consciousness: awake, alert , and oriented  Airway & Oxygen Therapy: Patient Spontanous Breathing  Post-op Assessment: Report given to RN and Post -op Vital signs reviewed and stable  Post vital signs: Reviewed and stable  Last Vitals:  Vitals Value Taken Time  BP 142/67 11/16/22 0825  Temp 36.5 C 11/16/22 0825  Pulse 89 11/16/22 0827  Resp 10 11/16/22 0827  SpO2 94 % 11/16/22 0827  Vitals shown include unvalidated device data.  Last Pain:  Vitals:   11/16/22 0601  TempSrc: Oral  PainSc: 6       Patients Stated Pain Goal: 5 (16/10/96 0454)  Complications: No notable events documented.

## 2022-11-16 NOTE — H&P (View-Only) (Signed)
H&P  Chief Complaint: Urothelial carcinoma of the left upper urinary tract  History of Present Illness: [name] is a 77 y.o. year old female presenting at this time for cystoscopy, left retrograde following stent removal, left ureteroscopy and holmium laser management of any left renal pelvic/calyceal or ureteral cancer.  She has a long history of urothelial carcinoma of the urinary tract, status post right nephro ureterectomy years ago for right ureteral TCCA.  She had recurrence in the left lower ureter in 2020.  She has had intermittent recurrence in the left pyelocalyceal system, although low volume.  She failed Jelmyto treatment of the left upper urinary tract, and now is just managed with intermittent ureteroscopic management.  Past Medical History:  Diagnosis Date   Anemia associated with chronic renal failure    CAD in native artery 10/2019   cardiologist-   dr Jerilynn Mages. [place] [facility];   a. 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 ([place]) 06/2019   urologist--- dr Loany Neuroth/  oncologist--- dr Alen Blew;   dx 09/ 2020 high grade urothelial  s/p laser ablation, ;  recurrent s/p BCG instillation,  completed chemo instilation 11/ 2022  and repeat chemo completed 05/ 2023   Chronic combined systolic and diastolic CHF (congestive heart failure) ([place]) 06/2019   followed by cardiology;   dx 09/ 2022 in setting of sepsis, pulm edema;   05/ 2022  ef 40-45% per echo,  recovered per cath 01/ 2021 ef 50%;  laste echo 08/ 2023  ef 60-65%   CKD (chronic kidney disease), stage III ([place])    Depression    GAD (generalized anxiety disorder)    GERD (gastroesophageal reflux disease)    History of bladder cancer 12/2006   followed by [name]   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   dx 04/ 2008  w/ poor function hydronephrotic  right kidney;   02-06-2007  s/p right nephroureterectomy   History of pulmonary  embolism 04/2022   in setting severe sepsis;  small RLL, treated w/ 3 months eliquis   Hyperlipidemia, mixed    Hypertension    Solitary kidney, acquired 02/06/2007   s/p  right nephroureterectomy for cancer    Past Surgical History:  Procedure Laterality Date   CATARACT EXTRACTION W/ INTRAOCULAR LENS IMPLANT Bilateral 2011   CYSTOSCOPY W/ URETERAL STENT PLACEMENT Left 11/17/2019   Procedure: CYSTOSCOPY WITH STENT REPLACEMENT;  Surgeon: Franchot Gallo, MD;  Location: [facility];  Service: Urology;  Laterality: Left;   CYSTOSCOPY W/ URETERAL STENT PLACEMENT Left 03/23/2022   Procedure: CYSTOSCOPY WITH RETROGRADE PYELOGRAM, LEFT URETERAL STENT PLACEMENT;  Surgeon: Franchot Gallo, MD;  Location: [facility];  Service: Urology;  Laterality: Left;   CYSTOSCOPY W/ URETERAL STENT PLACEMENT Left 06/09/2022   Procedure: CYSTOSCOPY WITH LEFT STENT EXCHANGE;  Surgeon: Franchot Gallo, MD;  Location: [facility];  Service: Urology;  Laterality: Left;   CYSTOSCOPY W/ URETERAL STENT REMOVAL Left 05/10/2020   Procedure: CYSTOSCOPY WITH STENT REMOVAL;  Surgeon: Franchot Gallo, MD;  Location: [facility];  Service: Urology;  Laterality: Left;   CYSTOSCOPY W/ URETERAL STENT REMOVAL Left 05/02/2021   Procedure: CYSTOSCOPY WITH STENT REMOVAL;  Surgeon: Franchot Gallo, MD;  Location: [facility];  Service: Urology;  Laterality: Left;   CYSTOSCOPY WITH RETROGRADE PYELOGRAM, URETEROSCOPY AND STENT PLACEMENT Left 06/27/2019   Procedure: CYSTOSCOPY WITH LEFT RETROGRADE PYELOGRAM, URETEROSCOPY, BIOPSY AND LEFT STENT  PLACEMENT;  Surgeon: Irine Seal, MD;  Location: [facility];  Service: Urology;  Laterality: Left;   CYSTOSCOPY WITH RETROGRADE PYELOGRAM, URETEROSCOPY AND STENT PLACEMENT Left 08/18/2019   Procedure: CYSTOSCOPY, URETEROSCOPY AND STENT EXCHANGE;  Surgeon: Franchot Gallo, MD;  Location: [facility];  Service: Urology;  Laterality: Left;   51 MINS   CYSTOSCOPY WITH RETROGRADE PYELOGRAM, URETEROSCOPY AND STENT PLACEMENT Left 11/17/2019   Procedure: CYSTOSCOPY WITH RETROGRADE PYELOGRAM, URETEROSCOPY AND STENT PLACEMENT;  Surgeon: Franchot Gallo, MD;  Location: [facility];  Service: Urology;  Laterality: Left;  [address] [place], URETEROSCOPY AND STENT PLACEMENT Left 05/10/2020   Procedure: CYSTOSCOPY WITH RETROGRADE PYELOGRAM, URETEROSCOPY AND STENT PLACEMENT WITH URETHRAL DIALATION AND BRUSH BIOPSY;  Surgeon: Franchot Gallo, MD;  Location: [facility];  Service: Urology;  Laterality: Left;  1 HR   CYSTOSCOPY WITH RETROGRADE PYELOGRAM, URETEROSCOPY AND STENT PLACEMENT Left 09/16/2020   Procedure: CYSTOSCOPY WITH RETROGRADE PYELOGRAM, URETEROSCOPY ,  LITHOPAXY, LEFT URETERAL BRUSHING, AND STENT REPLACEMENT;  Surgeon: Franchot Gallo, MD;  Location: [facility];  Service: Urology;  Laterality: Left;   CYSTOSCOPY WITH RETROGRADE PYELOGRAM, URETEROSCOPY AND STENT PLACEMENT Left 10/24/2021   Procedure: CYSTOSCOPY WITH RETROGRADE PYELOGRAM, URETEROSCOPY, POSSIBLE URETERAL AND RENAL BIOPSIES AND STENT PLACEMENT;  Surgeon: Franchot Gallo, MD;  Location: [facility];  Service: Urology;  Laterality: Left;  1 HR   CYSTOSCOPY WITH RETROGRADE PYELOGRAM, URETEROSCOPY AND STENT PLACEMENT Left 03/09/2022   Procedure: CYSTOSCOPY WITH ANTEGRADE PYELOGRAM,  STENT REMOVAL;  Surgeon: Franchot Gallo, MD;  Location[facility];  Service: Urology;  Laterality: Left;   CYSTOSCOPY WITH RETROGRADE PYELOGRAM, URETEROSCOPY AND STENT PLACEMENT  01/09/2007   '@[facility]'$  by [name];    bx's/ washing  bladder , right ureter   CYSTOSCOPY/URETEROSCOPY/HOLMIUM LASER/STENT PLACEMENT Left 05/02/2021   Procedure: CYSTOSCOPY/URETEROSCOPY WITH BRUSH BIOPSY/ RETROGRADE PYELOGRAM/ HOLMIUM LASER/STENT REPLACEMENT;  Surgeon: Franchot Gallo, MD;  Location:  [facility];  Service: Urology;  Laterality: Left;   HOLMIUM LASER APPLICATION Left [id number]   Procedure: HOLMIUM LASER APPLICATION;  Surgeon: Franchot Gallo, MD;  Location: [facility];  Service: Urology;  Laterality: Left;   HOLMIUM LASER APPLICATION Left [id number]   Procedure: HOLMIUM LASER APPLICATION OF TUMORS;  Surgeon: Franchot Gallo, MD;  Location: [facility]  Service: Urology;  Laterality: Left;   IR NEPHROSTOMY PLACEMENT LEFT  07/06/2021   IR NEPHROSTOMY PLACEMENT LEFT  02/06/2022   NEPHROSTOMY TUBE REMOVAL Left 03/23/2022   Procedure: NEPHROSTOMY TUBE REMOVAL;  Surgeon: Franchot Gallo, MD;  Location: [facility];  Service: Urology;  Laterality: Left;   NEPHROURETERECTOMY Right 02/06/2007   '@[facility]'$  by [name];   Laparoscopic   RIGHT/LEFT HEART CATH AND CORONARY ANGIOGRAPHY N/A 11/06/2019   Procedure: RIGHT/LEFT HEART CATH AND CORONARY ANGIOGRAPHY;  Surgeon: Belva Crome, MD;  Location: [place];  Service: Cardiovascular;  Laterality: N/A;   THULIUM LASER TURP (TRANSURETHRAL RESECTION OF PROSTATE) Left 08/18/2019   Procedure: THULIUM LASER ABLATION OF URETERAL TUMOR;  Surgeon: Franchot Gallo, MD;  Location: [facility];  Service: Urology;  Laterality: Left;   THULIUM LASER TURP (TRANSURETHRAL RESECTION OF PROSTATE) Left 11/17/2019   Procedure: THULIUM LASER of URETERAL CANCER;  Surgeon: Franchot Gallo, MD;  [facility]  Service: Urology;  Laterality: Left;   TRANSURETHRAL RESECTION OF BLADDER TUMOR  12/12/2006   '@[facility]'$  by dr Terance Hart   VAGINAL HYSTERECTOMY  1980    Home  Medications:  Medications Prior to Admission  Medication Sig Dispense Refill   acetaminophen (TYLENOL) 500 MG tablet Take 500-1,000 mg by mouth every 6 (six) hours as needed for mild pain.     calcium carbonate (TUMS EX) 750 MG chewable tablet Chew 1 tablet by mouth every morning.     carvedilol  (COREG) 6.25 MG tablet TAKE 1 TABLET(6.25 MG) BY MOUTH TWICE DAILY WITH A MEAL (Patient taking differently: Take 6.25 mg by mouth 2 (two) times daily with a meal.) 180 tablet 2   cholecalciferol (VITAMIN D3) 25 MCG (1000 UNIT) tablet Take 1,000 Units by mouth every morning.     Ferrous Sulfate (IRON PO) Take 1 tablet by mouth daily.     methenamine (MANDELAMINE) 0.5 GM tablet Take 500 mg by mouth 2 (two) times daily.     oxybutynin (DITROPAN) 5 MG tablet Take 5 mg by mouth 2 (two) times daily.     rosuvastatin (CRESTOR) 40 MG tablet Take 1 tablet (40 mg total) by mouth daily. (Patient taking differently: Take 40 mg by mouth daily.) 90 tablet 0   sertraline (ZOLOFT) 50 MG tablet Take 50 mg by mouth every morning.     traZODone (DESYREL) 150 MG tablet Take 150 mg by mouth at bedtime.     ondansetron (ZOFRAN) 8 MG tablet Take 8 mg by mouth 3 (three) times daily as needed for vomiting or nausea.     polyethylene glycol (MIRALAX / GLYCOLAX) 17 g packet Take 17 g by mouth daily. (Patient taking differently: Take 17 g by mouth daily as needed (constipation).) 30 each 0    Allergies:  Allergies  Allergen Reactions   Erythromycin Diarrhea and Nausea And Vomiting   Lotensin [Benazepril Hcl] Cough   Macrodantin [Nitrofurantoin] Diarrhea    And night sweats    Family History  Problem Relation Age of Onset   Hypertension Mother    Diabetes Mellitus II Sister    Hypertension Sister    Hypertension Brother     Social History:  reports that she quit smoking about 18 years ago. Her smoking use included cigarettes. She has a 19.00 pack-year smoking history. She has never used smokeless tobacco. She reports that she does not currently use alcohol. She reports that she does not use drugs.  ROS: A complete review of systems was performed.  All systems are negative except for pertinent findings as noted.  Physical Exam:  Vital signs in last 24 hours: Temp:  [98.1 F (36.7 C)] 98.1 F (36.7 C) (02/08  0601) Pulse Rate:  [72] 72 (02/08 0601) Resp:  [16] 16 (02/08 0601) BP: (139)/(60) 139/60 (02/08 0601) SpO2:  [97 %] 97 % (02/08 0601) Weight:  [71.8 kg] 71.8 kg (02/08 0601) General:  Alert and oriented, No acute distress HEENT: Normocephalic, atraumatic Neck: No JVD or lymphadenopathy Cardiovascular: Regular rate  Lungs: Normal inspiratory/expiratory excursion Extremities: No edema Neurologic: Grossly intact  I have reviewed urinalysis results  I have independently reviewed prior imaging  I have reviewed prior urine culture   Impression/Assessment:  Urothelial carcinoma of the left upper urinary tract  Plan:  Cystoscopy, left double-J stent extraction/replacement, left retrograde pyelogram, left ureteroscopy with holmium laser ablation of any upper tract tumors.  Lillette Boxer Derrick Tiegs 11/16/2022, 7:27 AM  Lillette Boxer. Jahzaria Vary MD

## 2022-11-16 NOTE — Op Note (Signed)
Pre-op diagnosis: Urothelial carcinoma of the left upper urinary tract and a solitary kidney  Postoperative diagnosis: Same  Procedure: Cystoscopy, left double-J stent exchange (6 [place] by 24 cm without tether), left retrograde ureteropyelogram, fluoroscopic interpretation, left ureteroscopy with holmium laser of minimal urothelial abnormalities of left ureter  Surgeon: Gwen Edler  Anesthesia: General with LMA  Complications: None, but endoscopic evaluation of the left pyelocalyceal system not done due to purulent urine once access catheter placed  Specimen: 1.  Urine for culture 2.  Left ureteral washing for cytology  Estimated blood loss: Less than 5 mL  Indications: 77 year old female with solitary left kidney.  She is status post right nephro ureterectomy for urothelial carcinoma of the right distal ureter years ago.  She has had recurrence in her left ureter and now small amount of urothelial carcinoma in her left pyelocalyceal system.  This has been managed with instillations of Jelmyto through percutaneous access.  She has not tolerated these, and is at this point just on routine surveillance of the upper tract with ureteroscopy and laser of any small tumors which have been seen and managed in the past.  She presents at this time for repeat ureteroscopic management.  Recent culture in the office revealed yeast, she has been on Diflucan for a week.  I discussed the procedure with her which is well-known to her, and she desires to proceed.  Findings: Inspection of the bladder revealed normal urothelium with a solitary orifice on the left with a stent which was not encrusted.  Retrograde study of the left ureter revealed minimal narrowing in the distal and mid ureter.  No filling defects were noted.  Pyelocalyceal system was not dilated.  I did not see any significant filling defects on retrograde study.  Ureteroscopic evaluation of the ureter up to the UPJ revealed minimal urothelial  abnormalities.  The distal ureteral tumor site had perhaps 1 small little polypoid lesion 2 mm in size.  It did have a stricture, however.  The mid ureteral narrowing revealed several small polypoid structures, each 1 to 2 mm in size.  These were not biopsied, but washings were taken from these areas.  After ureteroscopic management, and placement of ureteral access catheter up to the renal pelvis, purulent urine was obtained.  This was not evident with the cystoscopic examination.  At this point the urine was sent for culture, and upper tract ureteroscopy not performed.  Description of procedure: The patient was properly identified and marked in the holding area.  She was taken to the operating room where general anesthetic was administered with the LMA.  She is placed in the dorsolithotomy position.  Genitalia and perineum were prepped, draped, proper timeout performed.  21 French panendoscope advanced into the bladder with circumferential inspection performed.  Above-mentioned findings noted.  The left ureteral stent was grasped and easily extracted.  Retrograde study of the left ureter and pyelocalyceal system was performed using a 6 [place] open-ended catheter and Omnipaque.  The above-mentioned findings were noted.  I then passed a guidewire easily through the open-ended catheter and remove the scope and the catheter, leaving the guidewire indwelling.  I used the 6 [place] semirigid dual ureteroscope to investigate the left ureter.  It was easily passed up into the area of the UPJ.  Pictures were taken, first of the upper ureter, the mid ureter and then the lower ureter.  There were minimal urothelial abnormalities noted, thankfully.  However, all sites where I suspected small urothelial carcinoma, I utilized the  365 m fiber with holmium laser energy set at 0.3 J and 50 Hz.  These areas were gently treated after I have sent ureteral washings for cytology from all 3 of these areas combined.  Once this was  carried out, I backed the ureteroscope out.  I then passed the obturator and then the entire 11/13 medium length ureteral access catheter up into the area of the left renal pelvis.  Once the obturator was removed, purulent urine was obtained.  This was not expected, as she had been treated for the positive culture last week.  However, I felt it best to back out at this point.  The urine was sent for culture.  I then remove the access catheter, backloaded the guidewire through the cystoscope and placed the 6 French by 24 cm contour double-J stent easily.  Once adequately positioned the guidewire was removed and it was deployed with excellent proximal and distal curl seen in the left renal pelvic and bladder area using fluoroscopy and cystoscopy, respectively.  At this point the bladder was drained, the scope removed, and the procedure terminated.  The patient was then awakened and taken to the PACU in stable condition.  She tolerated the procedure well.  Due to the fact that she did have ureteroscopy and did have this purulent urine in her renal pelvis, I have will admit her for at least overnight stay for antibiotic management.

## 2022-11-16 NOTE — H&P (Signed)
H&P  Chief Complaint: Urothelial carcinoma of the left upper urinary tract  History of Present Illness: [name] is a 76 y.o. year old female presenting at this time for cystoscopy, left retrograde following stent removal, left ureteroscopy and holmium laser management of any left renal pelvic/calyceal or ureteral cancer.  She has a long history of urothelial carcinoma of the urinary tract, status post right nephro ureterectomy years ago for right ureteral TCCA.  She had recurrence in the left lower ureter in 2020.  She has had intermittent recurrence in the left pyelocalyceal system, although low volume.  She failed Jelmyto treatment of the left upper urinary tract, and now is just managed with intermittent ureteroscopic management.  Past Medical History:  Diagnosis Date   Anemia associated with chronic renal failure    CAD in native artery 10/2019   cardiologist-   dr m. chandrasekhar;   a. 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   urologist--- dr Hanan Moen/  oncologist--- dr shadad;   dx 09/ 2020 high grade urothelial  s/p laser ablation, ;  recurrent s/p BCG instillation,  completed chemo instilation 11/ 2022  and repeat chemo completed 05/ 2023   Chronic combined systolic and diastolic CHF (congestive heart failure) (HCC) 06/2019   followed by cardiology;   dx 09/ 2022 in setting of sepsis, pulm edema;   05/ 2022  ef 40-45% per echo,  recovered per cath 01/ 2021 ef 50%;  laste echo 08/ 2023  ef 60-65%   CKD (chronic kidney disease), stage III (HCC)    Depression    GAD (generalized anxiety disorder)    GERD (gastroesophageal reflux disease)    History of bladder cancer 12/2006   followed by dr Davisha Linthicum   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   dx 04/ 2008  w/ poor function hydronephrotic  right kidney;   02-06-2007  s/p right nephroureterectomy   History of pulmonary  embolism 04/2022   in setting severe sepsis;  small RLL, treated w/ 3 months eliquis   Hyperlipidemia, mixed    Hypertension    Solitary kidney, acquired 02/06/2007   s/p  right nephroureterectomy for cancer    Past Surgical History:  Procedure Laterality Date   CATARACT EXTRACTION W/ INTRAOCULAR LENS IMPLANT Bilateral 2011   CYSTOSCOPY W/ URETERAL STENT PLACEMENT Left 11/17/2019   Procedure: CYSTOSCOPY WITH STENT REPLACEMENT;  Surgeon: Bing Duffey, MD;  Location: [facility];  Service: Urology;  Laterality: Left;   CYSTOSCOPY W/ URETERAL STENT PLACEMENT Left 03/23/2022   Procedure: CYSTOSCOPY WITH RETROGRADE PYELOGRAM, LEFT URETERAL STENT PLACEMENT;  Surgeon: Marquavius Scaife, MD;  Location: [facility];  Service: Urology;  Laterality: Left;   CYSTOSCOPY W/ URETERAL STENT PLACEMENT Left 06/09/2022   Procedure: CYSTOSCOPY WITH LEFT STENT EXCHANGE;  Surgeon: Margi Edmundson, MD;  Location: [facility];  Service: Urology;  Laterality: Left;   CYSTOSCOPY W/ URETERAL STENT REMOVAL Left 05/10/2020   Procedure: CYSTOSCOPY WITH STENT REMOVAL;  Surgeon: Samyria Rudie, MD;  Location: [facility];  Service: Urology;  Laterality: Left;   CYSTOSCOPY W/ URETERAL STENT REMOVAL Left 05/02/2021   Procedure: CYSTOSCOPY WITH STENT REMOVAL;  Surgeon: Kruz Chiu, MD;  Location:  SURGERY CENTER;  Service: Urology;  Laterality: Left;   CYSTOSCOPY WITH RETROGRADE PYELOGRAM, URETEROSCOPY AND STENT PLACEMENT Left 06/27/2019   Procedure: CYSTOSCOPY WITH LEFT RETROGRADE PYELOGRAM, URETEROSCOPY, BIOPSY AND LEFT STENT   PLACEMENT;  Surgeon: Wrenn, John, MD;  Location: [facility];  Service: Urology;  Laterality: Left;   CYSTOSCOPY WITH RETROGRADE PYELOGRAM, URETEROSCOPY AND STENT PLACEMENT Left 08/18/2019   Procedure: CYSTOSCOPY, URETEROSCOPY AND STENT EXCHANGE;  Surgeon: Charnel Giles, MD;  Location: [facility];  Service: Urology;  Laterality: Left;   90 MINS   CYSTOSCOPY WITH RETROGRADE PYELOGRAM, URETEROSCOPY AND STENT PLACEMENT Left 11/17/2019   Procedure: CYSTOSCOPY WITH RETROGRADE PYELOGRAM, URETEROSCOPY AND STENT PLACEMENT;  Surgeon: Avory Mimbs, MD;  Location: [facility];  Service: Urology;  Laterality: Left;  90 MINS   CYSTOSCOPY WITH RETROGRADE PYELOGRAM, URETEROSCOPY AND STENT PLACEMENT Left 05/10/2020   Procedure: CYSTOSCOPY WITH RETROGRADE PYELOGRAM, URETEROSCOPY AND STENT PLACEMENT WITH URETHRAL DIALATION AND BRUSH BIOPSY;  Surgeon: Scharlene Catalina, MD;  Location: [facility];  Service: Urology;  Laterality: Left;  1 HR   CYSTOSCOPY WITH RETROGRADE PYELOGRAM, URETEROSCOPY AND STENT PLACEMENT Left 09/16/2020   Procedure: CYSTOSCOPY WITH RETROGRADE PYELOGRAM, URETEROSCOPY ,  LITHOPAXY, LEFT URETERAL BRUSHING, AND STENT REPLACEMENT;  Surgeon: Meklit Cotta, MD;  Location: [facility];  Service: Urology;  Laterality: Left;   CYSTOSCOPY WITH RETROGRADE PYELOGRAM, URETEROSCOPY AND STENT PLACEMENT Left 10/24/2021   Procedure: CYSTOSCOPY WITH RETROGRADE PYELOGRAM, URETEROSCOPY, POSSIBLE URETERAL AND RENAL BIOPSIES AND STENT PLACEMENT;  Surgeon: Janney Priego, MD;  Location: [facility];  Service: Urology;  Laterality: Left;  1 HR   CYSTOSCOPY WITH RETROGRADE PYELOGRAM, URETEROSCOPY AND STENT PLACEMENT Left 03/09/2022   Procedure: CYSTOSCOPY WITH ANTEGRADE PYELOGRAM,  STENT REMOVAL;  Surgeon: Jamera Vanloan, MD;  Location: [facility];  Service: Urology;  Laterality: Left;   CYSTOSCOPY WITH RETROGRADE PYELOGRAM, URETEROSCOPY AND STENT PLACEMENT  01/09/2007   @[facility] by dr Margery Szostak;    bx's/ washing  bladder , right ureter   CYSTOSCOPY/URETEROSCOPY/HOLMIUM LASER/STENT PLACEMENT Left 05/02/2021   Procedure: CYSTOSCOPY/URETEROSCOPY WITH BRUSH BIOPSY/ RETROGRADE PYELOGRAM/ HOLMIUM LASER/STENT REPLACEMENT;  Surgeon: Coleton Woon, MD;  Location:  [facility];  Service: Urology;  Laterality: Left;   HOLMIUM LASER APPLICATION Left 09/16/2020   Procedure: HOLMIUM LASER APPLICATION;  Surgeon: Harout Scheurich, MD;  Location: [facility];  Service: Urology;  Laterality: Left;   HOLMIUM LASER APPLICATION Left 10/24/2021   Procedure: HOLMIUM LASER APPLICATION OF TUMORS;  Surgeon: Simone Tuckey, MD;  Location: [facility];  Service: Urology;  Laterality: Left;   IR NEPHROSTOMY PLACEMENT LEFT  07/06/2021   IR NEPHROSTOMY PLACEMENT LEFT  02/06/2022   NEPHROSTOMY TUBE REMOVAL Left 03/23/2022   Procedure: NEPHROSTOMY TUBE REMOVAL;  Surgeon: Jaymian Bogart, MD;  Location: [facility];  Service: Urology;  Laterality: Left;   NEPHROURETERECTOMY Right 02/06/2007   @[facility] by dr Needham Biggins;   Laparoscopic   RIGHT/LEFT HEART CATH AND CORONARY ANGIOGRAPHY N/A 11/06/2019   Procedure: RIGHT/LEFT HEART CATH AND CORONARY ANGIOGRAPHY;  Surgeon: Smith, Henry W, MD;  Location: [facility];  Service: Cardiovascular;  Laterality: N/A;   THULIUM LASER TURP (TRANSURETHRAL RESECTION OF PROSTATE) Left 08/18/2019   Procedure: THULIUM LASER ABLATION OF URETERAL TUMOR;  Surgeon: Chidi Shirer, MD;  Location: [facility];  Service: Urology;  Laterality: Left;   THULIUM LASER TURP (TRANSURETHRAL RESECTION OF PROSTATE) Left 11/17/2019   Procedure: THULIUM LASER of URETERAL CANCER;  Surgeon: Theophilus Walz, MD;  Location: [facility];  Service: Urology;  Laterality: Left;   TRANSURETHRAL RESECTION OF BLADDER TUMOR  12/12/2006   @[facility] by dr peterson   VAGINAL HYSTERECTOMY  1980    Home   Medications:  Medications Prior to Admission  Medication Sig Dispense Refill   acetaminophen (TYLENOL) 500 MG tablet Take 500-1,000 mg by mouth every 6 (six) hours as needed for mild pain.     calcium carbonate (TUMS EX) 750 MG chewable tablet Chew 1 tablet by mouth every morning.     carvedilol  (COREG) 6.25 MG tablet TAKE 1 TABLET(6.25 MG) BY MOUTH TWICE DAILY WITH A MEAL (Patient taking differently: Take 6.25 mg by mouth 2 (two) times daily with a meal.) 180 tablet 2   cholecalciferol (VITAMIN D3) 25 MCG (1000 UNIT) tablet Take 1,000 Units by mouth every morning.     Ferrous Sulfate (IRON PO) Take 1 tablet by mouth daily.     methenamine (MANDELAMINE) 0.5 GM tablet Take 500 mg by mouth 2 (two) times daily.     oxybutynin (DITROPAN) 5 MG tablet Take 5 mg by mouth 2 (two) times daily.     rosuvastatin (CRESTOR) 40 MG tablet Take 1 tablet (40 mg total) by mouth daily. (Patient taking differently: Take 40 mg by mouth daily.) 90 tablet 0   sertraline (ZOLOFT) 50 MG tablet Take 50 mg by mouth every morning.     traZODone (DESYREL) 150 MG tablet Take 150 mg by mouth at bedtime.     ondansetron (ZOFRAN) 8 MG tablet Take 8 mg by mouth 3 (three) times daily as needed for vomiting or nausea.     polyethylene glycol (MIRALAX / GLYCOLAX) 17 g packet Take 17 g by mouth daily. (Patient taking differently: Take 17 g by mouth daily as needed (constipation).) 30 each 0    Allergies:  Allergies  Allergen Reactions   Erythromycin Diarrhea and Nausea And Vomiting   Lotensin [Benazepril Hcl] Cough   Macrodantin [Nitrofurantoin] Diarrhea    And night sweats    Family History  Problem Relation Age of Onset   Hypertension Mother    Diabetes Mellitus II Sister    Hypertension Sister    Hypertension Brother     Social History:  reports that she quit smoking about 18 years ago. Her smoking use included cigarettes. She has a 19.00 pack-year smoking history. She has never used smokeless tobacco. She reports that she does not currently use alcohol. She reports that she does not use drugs.  ROS: A complete review of systems was performed.  All systems are negative except for pertinent findings as noted.  Physical Exam:  Vital signs in last 24 hours: Temp:  [98.1 F (36.7 C)] 98.1 F (36.7 C) (02/08  0601) Pulse Rate:  [72] 72 (02/08 0601) Resp:  [16] 16 (02/08 0601) BP: (139)/(60) 139/60 (02/08 0601) SpO2:  [97 %] 97 % (02/08 0601) Weight:  [71.8 kg] 71.8 kg (02/08 0601) General:  Alert and oriented, No acute distress HEENT: Normocephalic, atraumatic Neck: No JVD or lymphadenopathy Cardiovascular: Regular rate  Lungs: Normal inspiratory/expiratory excursion Extremities: No edema Neurologic: Grossly intact  I have reviewed urinalysis results  I have independently reviewed prior imaging  I have reviewed prior urine culture   Impression/Assessment:  Urothelial carcinoma of the left upper urinary tract  Plan:  Cystoscopy, left double-J stent extraction/replacement, left retrograde pyelogram, left ureteroscopy with holmium laser ablation of any upper tract tumors.  Khair Chasteen M Dontai Pember 11/16/2022, 7:27 AM  Heather Jerrell M. Taiyo Kozma MD    

## 2022-11-17 ENCOUNTER — Inpatient Hospital Stay: Payer: Medicare PPO | Admitting: Hematology

## 2022-11-17 DIAGNOSIS — I5042 Chronic combined systolic (congestive) and diastolic (congestive) heart failure: Secondary | ICD-10-CM | POA: Diagnosis not present

## 2022-11-17 DIAGNOSIS — N183 Chronic kidney disease, stage 3 unspecified: Secondary | ICD-10-CM | POA: Diagnosis not present

## 2022-11-17 DIAGNOSIS — Z79899 Other long term (current) drug therapy: Secondary | ICD-10-CM | POA: Diagnosis not present

## 2022-11-17 DIAGNOSIS — Z905 Acquired absence of kidney: Secondary | ICD-10-CM | POA: Diagnosis not present

## 2022-11-17 DIAGNOSIS — Z87891 Personal history of nicotine dependence: Secondary | ICD-10-CM | POA: Diagnosis not present

## 2022-11-17 DIAGNOSIS — C651 Malignant neoplasm of right renal pelvis: Secondary | ICD-10-CM | POA: Diagnosis not present

## 2022-11-17 DIAGNOSIS — I2510 Atherosclerotic heart disease of native coronary artery without angina pectoris: Secondary | ICD-10-CM | POA: Diagnosis not present

## 2022-11-17 DIAGNOSIS — C662 Malignant neoplasm of left ureter: Secondary | ICD-10-CM | POA: Diagnosis not present

## 2022-11-17 DIAGNOSIS — I130 Hypertensive heart and chronic kidney disease with heart failure and stage 1 through stage 4 chronic kidney disease, or unspecified chronic kidney disease: Secondary | ICD-10-CM | POA: Diagnosis not present

## 2022-11-17 DIAGNOSIS — N281 Cyst of kidney, acquired: Secondary | ICD-10-CM | POA: Diagnosis not present

## 2022-11-17 LAB — CBC WITH DIFFERENTIAL/PLATELET
Abs Immature Granulocytes: 0.05 10*3/uL (ref 0.00–0.07)
Basophils Absolute: 0 10*3/uL (ref 0.0–0.1)
Basophils Relative: 0 %
Eosinophils Absolute: 0 10*3/uL (ref 0.0–0.5)
Eosinophils Relative: 0 %
HCT: 34.2 % — ABNORMAL LOW (ref 36.0–46.0)
Hemoglobin: 10.6 g/dL — ABNORMAL LOW (ref 12.0–15.0)
Immature Granulocytes: 0 %
Lymphocytes Relative: 15 %
Lymphs Abs: 2 10*3/uL (ref 0.7–4.0)
MCH: 29 pg (ref 26.0–34.0)
MCHC: 31 g/dL (ref 30.0–36.0)
MCV: 93.4 fL (ref 80.0–100.0)
Monocytes Absolute: 0.6 10*3/uL (ref 0.1–1.0)
Monocytes Relative: 4 %
Neutro Abs: 10.4 10*3/uL — ABNORMAL HIGH (ref 1.7–7.7)
Neutrophils Relative %: 81 %
Platelets: 236 10*3/uL (ref 150–400)
RBC: 3.66 MIL/uL — ABNORMAL LOW (ref 3.87–5.11)
RDW: 14.9 % (ref 11.5–15.5)
WBC: 13.1 10*3/uL — ABNORMAL HIGH (ref 4.0–10.5)
nRBC: 0 % (ref 0.0–0.2)

## 2022-11-17 LAB — URINE CULTURE: Culture: NO GROWTH

## 2022-11-17 LAB — BASIC METABOLIC PANEL
Anion gap: 12 (ref 5–15)
BUN: 30 mg/dL — ABNORMAL HIGH (ref 8–23)
CO2: 23 mmol/L (ref 22–32)
Calcium: 9.1 mg/dL (ref 8.9–10.3)
Chloride: 101 mmol/L (ref 98–111)
Creatinine, Ser: 2.36 mg/dL — ABNORMAL HIGH (ref 0.44–1.00)
GFR, Estimated: 21 mL/min — ABNORMAL LOW (ref >60)
Glucose, Bld: 131 mg/dL — ABNORMAL HIGH (ref 70–99)
Potassium: 4.4 mmol/L (ref 3.5–5.1)
Sodium: 136 mmol/L (ref 135–145)

## 2022-11-17 LAB — CYTOLOGY - NON PAP

## 2022-11-17 MED ORDER — CEFDINIR 300 MG PO CAPS: 300.0000 mg | ORAL_CAPSULE | Freq: Every day | ORAL | 0 refills | Status: DC

## 2022-11-17 MED ORDER — FLUCONAZOLE 100 MG PO TABS: 100.0000 mg | ORAL_TABLET | Freq: Every day | ORAL | 0 refills | Status: DC

## 2022-11-17 NOTE — Plan of Care (Signed)

## 2022-11-17 NOTE — Progress Notes (Signed)
.   Transition of Care [facility]) Screening Note   Patient Details  Name: [name] Date of Birth: 12/02/1945   Transition of Care [facility]) CM/SW Contact:    Illene Regulus, LCSW Phone Number: 11/17/2022, 8:36 AM    Transition of Care Department [facility]) has reviewed patient and no TOC needs have been identified at this time. We will continue to monitor patient advancement through interdisciplinary progression rounds. If new patient transition needs arise, please place a TOC consult.

## 2022-11-20 ENCOUNTER — Encounter (HOSPITAL_BASED_OUTPATIENT_CLINIC_OR_DEPARTMENT_OTHER): Payer: Self-pay | Admitting: Urology

## 2022-11-23 ENCOUNTER — Other Ambulatory Visit: Payer: Self-pay | Admitting: Internal Medicine

## 2022-11-27 ENCOUNTER — Other Ambulatory Visit: Payer: Self-pay | Admitting: Urology

## 2022-11-28 ENCOUNTER — Encounter (HOSPITAL_BASED_OUTPATIENT_CLINIC_OR_DEPARTMENT_OTHER): Payer: Self-pay | Admitting: Urology

## 2022-11-28 DIAGNOSIS — N3000 Acute cystitis without hematuria: Secondary | ICD-10-CM | POA: Diagnosis not present

## 2022-11-28 NOTE — Progress Notes (Addendum)
Spoke w/ via phone for pre-op interview---[name] needs dos---- ISTAT (GENT)            [name] results------Current EKG, CXR and ECHO in Epic dated 05/2022 COVID test -----patient states asymptomatic no test needed Arrive at -------0915 NPO after MN NO Solid Food.  Clear liquids from MN until---0715 Med rec completed Medications to take morning of surgery -----Crestor, Coreg, Zoloft and Oxybutinin Diabetic medication ----- Patient instructed no nail polish to be worn day of surgery Patient instructed to bring photo id and insurance card day of surgery Patient aware to have Driver (ride ) / caregiver [name] (Husband)   for 24 hours after surgery  Patient Special Instructions ----- Pre-Op special Istructions ----- Patient verbalized understanding of instructions that were given at this phone interview. Patient denies shortness of breath, chest pain, fever, cough at this phone interview.

## 2022-11-30 NOTE — Discharge Summary (Signed)
Patient ID: [name] MRN: [id number] DOB/AGE: 1946/06/26 77 y.o.  Admit date: 11/16/2022 Discharge date:11/17/2022  Primary Care Physician:  Heather Smoker, MD  Discharge Diagnoses:   Present on Admission:  Malignant neoplasm of renal pelvis [facility])     Discharge Medications: Allergies as of 11/17/2022       Reactions   Erythromycin Diarrhea, Nausea And Vomiting   Lotensin [benazepril Hcl] Cough   Macrodantin [nitrofurantoin] Diarrhea   And night sweats        Medication List     STOP taking these medications    methenamine 0.5 GM tablet Commonly known as: MANDELAMINE       TAKE these medications    acetaminophen 500 MG tablet Commonly known as: TYLENOL Take 500-1,000 mg by mouth every 6 (six) hours as needed for mild pain.   calcium carbonate 750 MG chewable tablet Commonly known as: TUMS EX Chew 1 tablet by mouth every morning.   carvedilol 6.25 MG tablet Commonly known as: COREG TAKE 1 TABLET(6.25 MG) BY MOUTH TWICE DAILY WITH A MEAL What changed:  how much to take how to take this when to take this additional instructions   cefdinir 300 MG capsule Commonly known as: OMNICEF Take 1 capsule (300 mg total) by mouth daily.   cholecalciferol 25 MCG (1000 UNIT) tablet Commonly known as: VITAMIN D3 Take 1,000 Units by mouth every morning.   fluconazole 100 MG tablet Commonly known as: DIFLUCAN Take 1 tablet (100 mg total) by mouth daily. 7 DS   IRON PO Take 1 tablet by mouth daily.   ondansetron 8 MG tablet Commonly known as: ZOFRAN Take 8 mg by mouth 3 (three) times daily as needed for vomiting or nausea.   oxybutynin 5 MG tablet Commonly known as: DITROPAN Take 5 mg by mouth 2 (two) times daily.   polyethylene glycol 17 g packet Commonly known as: MIRALAX / GLYCOLAX Take 17 g by mouth daily. What changed:  when to take this reasons to take this   rosuvastatin 40 MG tablet Commonly known as: CRESTOR Take 1 tablet (40 mg  total) by mouth daily.   sertraline 50 MG tablet Commonly known as: ZOLOFT Take 50 mg by mouth every morning.   traZODone 150 MG tablet Commonly known as: DESYREL Take 150 mg by mouth at bedtime.         Significant Diagnostic Studies:  No results found.  Brief H and P: For complete details please refer to admission H and P, but in brief send he is admitted to the OR for ureteroscopic management and follow-up of urothelial carcinoma of the right ureter and renal pelvis.  Hospital Course:  Principal Problem:   Malignant neoplasm of renal pelvis ([place])  [name] tolerated her procedure well.  She did undergo ureteroscopic evaluation of the right ureter.  This revealed no evident carcinoma.  When the access catheter was passed into the renal pelvis, purulent urine was obtained and the procedure was aborted.  She did have a stent placed.  She was left in overnight on antibiotic management to avoid significant illness.  She did well overnight and was afebrile the morning after the procedure.  She was then discharged.  Day of Discharge BP (!) 142/80 (BP Location: Left Arm)   Pulse 81   Temp 97.6 F (36.4 C) (Oral)   Resp 15   Ht 5' 2"$  (1.575 m)   Wt 71.8 kg   SpO2 93%   BMI 28.97 kg/m   No results  found for this or any previous visit (from the past 24 hour(s)).  Physical Exam: General: Alert and awake oriented x3 not in any acute distress. HEENT: anicteric sclera, pupils reactive to light and accommodation CVS: S1-S2 clear no murmur rubs or gallops Chest: clear to auscultation bilaterally, no wheezing rales or rhonchi Abdomen: soft nontender, nondistended, normal bowel sounds, no organomegaly Extremities: no cyanosis, clubbing or edema noted bilaterally Neuro: Cranial nerves II-XII intact, no focal neurological deficits  Disposition: Home  Diet: No restrictions  Activity: No restrictions   TESTS THAT NEED FOLLOW-UP  Follow-up cytology of the ureter  DISCHARGE  FOLLOW-UP    Time spent on Discharge:  15 minutes  Signed: Lillette Boxer Fahed [name] 11/30/2022, 2:29 PM

## 2022-12-03 NOTE — Anesthesia Preprocedure Evaluation (Signed)
Anesthesia Evaluation  Patient identified by MRN, date of birth, ID band Patient awake    Reviewed: Allergy & Precautions, NPO status , Patient's Chart, lab work & pertinent test results  Airway Mallampati: II  TM Distance: >3 FB Neck ROM: Full    Dental no notable dental hx. (+) Teeth Intact, Dental Advisory Given   Pulmonary former smoker, PE   Pulmonary exam normal breath sounds clear to auscultation       Cardiovascular hypertension, +CHF  Normal cardiovascular exam Rhythm:Regular Rate:Normal  05/2022 Echo 1. Left ventricular ejection fraction, by estimation, is 60 to 65%. Left  ventricular ejection fraction by 3D volume is 63 %. The left ventricle has  normal function. The left ventricle has no regional wall motion  abnormalities. Left ventricular diastolic   parameters are consistent with Grade I diastolic dysfunction (impaired  relaxation).   2. Right ventricular systolic function is normal. The right ventricular  size is normal. There is normal pulmonary artery systolic pressure. The  estimated right ventricular systolic pressure is [id number] mmHg.   3. The mitral valve is grossly normal. Mild mitral valve regurgitation.   4. The aortic valve is tricuspid. There is mild calcification of the  aortic valve. There is mild thickening of the aortic valve. Aortic valve  regurgitation is not visualized. Aortic valve sclerosis/calcification is  present, without any evidence of  aortic stenosis.   5. There is Severe (Grade IV) plaque involving the ascending aorta.   6. The inferior vena cava is normal in size with <50% respiratory  variability, suggesting right atrial pressure of 8 mmHg.     Neuro/Psych   Anxiety        GI/Hepatic ,GERD  ,,  Endo/Other    Renal/GU Renal diseaseLab Results      Component                Value               Date                      CREATININE               2.36 (H)            11/17/2022                 BUN                      30 (H)              11/17/2022                NA                       136                 11/17/2022                K                        4.4                 11/17/2022                CL                       101  11/17/2022                CO2                      23                  11/17/2022                Musculoskeletal   Abdominal   Peds  Hematology  (+) Blood dyscrasia, anemia Lab Results      Component                Value               Date                      WBC                      13.1 (H)            11/17/2022                HGB                      10.6 (L)            11/17/2022                HCT                      34.2 (L)            11/17/2022                MCV                      93.4                11/17/2022                PLT                      236                 11/17/2022              Anesthesia Other Findings All: Erythromycin, benazepril, nitrofurantoin  Reproductive/Obstetrics                             Anesthesia Physical Anesthesia Plan  ASA: 3  Anesthesia Plan: General   Post-op Pain Management: Ofirmev IV (intra-op)* and Tylenol PO (pre-op)*   Induction: Intravenous  PONV Risk Score and Plan: 4 or greater and Treatment may vary due to age or medical condition, Ondansetron and Dexamethasone  Airway Management Planned: LMA  Additional Equipment: None  Intra-op Plan:   Post-operative Plan: Extubation in OR  Informed Consent: I have reviewed the patients History and Physical, chart, labs and discussed the procedure including the risks, benefits and alternatives for the proposed anesthesia with the patient or authorized representative who has indicated his/her understanding and acceptance.     Dental advisory given  Plan Discussed with:   Anesthesia Plan Comments:        Anesthesia Quick Evaluation

## 2022-12-04 ENCOUNTER — Encounter (HOSPITAL_BASED_OUTPATIENT_CLINIC_OR_DEPARTMENT_OTHER): Payer: Self-pay | Admitting: Urology

## 2022-12-04 ENCOUNTER — Ambulatory Visit (HOSPITAL_BASED_OUTPATIENT_CLINIC_OR_DEPARTMENT_OTHER): Payer: Medicare PPO | Admitting: Anesthesiology

## 2022-12-04 ENCOUNTER — Encounter (HOSPITAL_BASED_OUTPATIENT_CLINIC_OR_DEPARTMENT_OTHER)
Admission: RE | Disposition: A | Discharge status: Home / Self Care | Payer: Self-pay | Source: Home / Self Care | Attending: Urology

## 2022-12-04 ENCOUNTER — Ambulatory Visit (HOSPITAL_BASED_OUTPATIENT_CLINIC_OR_DEPARTMENT_OTHER)
Admission: RE | Admit: 2022-12-04 | Discharge: 2022-12-04 | Disposition: A | Discharge status: Home / Self Care | Payer: Medicare PPO | Attending: Urology | Admitting: Urology

## 2022-12-04 DIAGNOSIS — I5042 Chronic combined systolic (congestive) and diastolic (congestive) heart failure: Secondary | ICD-10-CM

## 2022-12-04 DIAGNOSIS — I509 Heart failure, unspecified: Secondary | ICD-10-CM | POA: Diagnosis not present

## 2022-12-04 DIAGNOSIS — D649 Anemia, unspecified: Secondary | ICD-10-CM | POA: Diagnosis not present

## 2022-12-04 DIAGNOSIS — N2889 Other specified disorders of kidney and ureter: Secondary | ICD-10-CM

## 2022-12-04 DIAGNOSIS — D759 Disease of blood and blood-forming organs, unspecified: Secondary | ICD-10-CM | POA: Diagnosis not present

## 2022-12-04 DIAGNOSIS — N3289 Other specified disorders of bladder: Secondary | ICD-10-CM | POA: Diagnosis not present

## 2022-12-04 DIAGNOSIS — C688 Malignant neoplasm of overlapping sites of urinary organs: Secondary | ICD-10-CM | POA: Insufficient documentation

## 2022-12-04 DIAGNOSIS — F419 Anxiety disorder, unspecified: Secondary | ICD-10-CM | POA: Insufficient documentation

## 2022-12-04 DIAGNOSIS — Z01818 Encounter for other preprocedural examination: Secondary | ICD-10-CM

## 2022-12-04 DIAGNOSIS — Z87891 Personal history of nicotine dependence: Secondary | ICD-10-CM | POA: Diagnosis not present

## 2022-12-04 DIAGNOSIS — Z906 Acquired absence of other parts of urinary tract: Secondary | ICD-10-CM | POA: Insufficient documentation

## 2022-12-04 DIAGNOSIS — C662 Malignant neoplasm of left ureter: Secondary | ICD-10-CM | POA: Diagnosis not present

## 2022-12-04 DIAGNOSIS — N183 Chronic kidney disease, stage 3 unspecified: Secondary | ICD-10-CM | POA: Insufficient documentation

## 2022-12-04 DIAGNOSIS — I110 Hypertensive heart disease with heart failure: Secondary | ICD-10-CM

## 2022-12-04 DIAGNOSIS — Z8559 Personal history of malignant neoplasm of other urinary tract organ: Secondary | ICD-10-CM

## 2022-12-04 DIAGNOSIS — Z905 Acquired absence of kidney: Secondary | ICD-10-CM | POA: Insufficient documentation

## 2022-12-04 DIAGNOSIS — Z86711 Personal history of pulmonary embolism: Secondary | ICD-10-CM | POA: Diagnosis not present

## 2022-12-04 DIAGNOSIS — Z8554 Personal history of malignant neoplasm of ureter: Secondary | ICD-10-CM | POA: Diagnosis not present

## 2022-12-04 DIAGNOSIS — N281 Cyst of kidney, acquired: Secondary | ICD-10-CM

## 2022-12-04 DIAGNOSIS — I130 Hypertensive heart and chronic kidney disease with heart failure and stage 1 through stage 4 chronic kidney disease, or unspecified chronic kidney disease: Secondary | ICD-10-CM | POA: Insufficient documentation

## 2022-12-04 DIAGNOSIS — Q600 Renal agenesis, unilateral: Secondary | ICD-10-CM | POA: Diagnosis not present

## 2022-12-04 HISTORY — PX: CYSTOSCOPY/URETEROSCOPY/HOLMIUM LASER: SHX6545

## 2022-12-04 HISTORY — PX: CYSTOSCOPY W/ URETERAL STENT PLACEMENT: SHX1429

## 2022-12-04 LAB — POCT I-STAT, CHEM 8
BUN: 35 mg/dL — ABNORMAL HIGH (ref 8–23)
Calcium, Ion: 1.26 mmol/L (ref 1.15–1.40)
Chloride: 103 mmol/L (ref 98–111)
Creatinine, Ser: 2.4 mg/dL — ABNORMAL HIGH (ref 0.44–1.00)
Glucose, Bld: 107 mg/dL — ABNORMAL HIGH (ref 70–99)
HCT: 37 % (ref 36.0–46.0)
Hemoglobin: 12.6 g/dL (ref 12.0–15.0)
Potassium: 4.5 mmol/L (ref 3.5–5.1)
Sodium: 140 mmol/L (ref 135–145)
TCO2: 29 mmol/L (ref 22–32)

## 2022-12-04 SURGERY — CYSTOURETEROSCOPY, USING HOLMIUM LASER
Anesthesia: General | Laterality: Left

## 2022-12-04 MED ORDER — DEXAMETHASONE SODIUM PHOSPHATE 10 MG/ML IJ SOLN
INTRAMUSCULAR | Status: DC | PRN
  Administered 2022-12-04: 5 mg via INTRAVENOUS

## 2022-12-04 MED ORDER — SODIUM CHLORIDE 0.9 % IR SOLN
Status: DC | PRN
  Administered 2022-12-04: 3000 mL via INTRAVESICAL

## 2022-12-04 MED ORDER — FENTANYL CITRATE (PF) 100 MCG/2ML IJ SOLN
INTRAMUSCULAR | Status: AC
  Filled 2022-12-04: qty 2

## 2022-12-04 MED ORDER — ONDANSETRON HCL 4 MG/2ML IJ SOLN: 4.0000 mg | Freq: Once | INTRAMUSCULAR | Status: DC | PRN

## 2022-12-04 MED ORDER — PROPOFOL 10 MG/ML IV BOLUS
INTRAVENOUS | Status: AC
  Filled 2022-12-04: qty 20

## 2022-12-04 MED ORDER — IOHEXOL 300 MG/ML  SOLN: INTRAMUSCULAR | Status: DC | PRN

## 2022-12-04 MED ORDER — GENTAMICIN SULFATE 40 MG/ML IJ SOLN
1.5000 mg/kg | INTRAVENOUS | Status: AC
  Administered 2022-12-04: 30 mg via INTRAVENOUS
  Filled 2022-12-04: qty 2.25

## 2022-12-04 MED ORDER — ONDANSETRON HCL 4 MG/2ML IJ SOLN
INTRAMUSCULAR | Status: DC | PRN
  Administered 2022-12-04: 4 mg via INTRAVENOUS

## 2022-12-04 MED ORDER — FENTANYL CITRATE (PF) 100 MCG/2ML IJ SOLN
INTRAMUSCULAR | Status: DC | PRN
  Administered 2022-12-04: 50 ug via INTRAVENOUS

## 2022-12-04 MED ORDER — PROPOFOL 10 MG/ML IV BOLUS
INTRAVENOUS | Status: DC | PRN
  Administered 2022-12-04: 130 mg via INTRAVENOUS

## 2022-12-04 MED ORDER — EPHEDRINE 5 MG/ML INJ
INTRAVENOUS | Status: AC
  Filled 2022-12-04: qty 5

## 2022-12-04 MED ORDER — ACETAMINOPHEN 10 MG/ML IV SOLN: 1000.0000 mg | Freq: Once | INTRAVENOUS | Status: DC | PRN

## 2022-12-04 MED ORDER — SODIUM CHLORIDE 0.9 % IV SOLN: INTRAVENOUS | Status: DC

## 2022-12-04 MED ORDER — FENTANYL CITRATE (PF) 100 MCG/2ML IJ SOLN
25.0000 ug | INTRAMUSCULAR | Status: DC | PRN
  Administered 2022-12-04 (×3): 25 ug via INTRAVENOUS

## 2022-12-04 MED ORDER — LIDOCAINE 2% (20 MG/ML) 5 ML SYRINGE
INTRAMUSCULAR | Status: DC | PRN
  Administered 2022-12-04: 80 mg via INTRAVENOUS

## 2022-12-04 MED ORDER — ONDANSETRON HCL 4 MG/2ML IJ SOLN
INTRAMUSCULAR | Status: AC
  Filled 2022-12-04: qty 2

## 2022-12-04 MED ORDER — LACTATED RINGERS IV SOLN: INTRAVENOUS | Status: DC

## 2022-12-04 MED ORDER — EPHEDRINE SULFATE-NACL 50-0.9 MG/10ML-% IV SOSY
PREFILLED_SYRINGE | INTRAVENOUS | Status: DC | PRN
  Administered 2022-12-04: 10 mg via INTRAVENOUS
  Administered 2022-12-04: 5 mg via INTRAVENOUS

## 2022-12-04 MED ORDER — DEXAMETHASONE SODIUM PHOSPHATE 10 MG/ML IJ SOLN
INTRAMUSCULAR | Status: AC
  Filled 2022-12-04: qty 1

## 2022-12-04 SURGICAL SUPPLY — 27 items
BAG DRAIN URO-CYSTO SKYTR STRL (DRAIN) ×1 IMPLANT
BAG DRN UROCATH (DRAIN) ×1
BASKET ZERO TIP NITINOL 2.4FR (BASKET) IMPLANT
BLANKET WARM UPPER BOD BAIR (MISCELLANEOUS) ×1 IMPLANT
BSKT STON RTRVL ZERO TP 2.4FR (BASKET)
CATH URETL OPEN END 6FR 70 (CATHETERS) ×1 IMPLANT
CLOTH BEACON ORANGE TIMEOUT ST (SAFETY) ×1 IMPLANT
COVER DOME SNAP 22 D (MISCELLANEOUS) ×1 IMPLANT
ELECT REM PT RETURN 9FT ADLT (ELECTROSURGICAL)
ELECTRODE REM PT RTRN 9FT ADLT (ELECTROSURGICAL) IMPLANT
GLOVE BIO SURGEON STRL SZ8 (GLOVE) ×1 IMPLANT
GOWN STRL REUS W/TWL XL LVL3 (GOWN DISPOSABLE) ×1 IMPLANT
GUIDEWIRE ANG ZIPWIRE 038X150 (WIRE) IMPLANT
GUIDEWIRE STR DUAL SENSOR (WIRE) IMPLANT
IV NS IRRIG 3000ML ARTHROMATIC (IV SOLUTION) ×2 IMPLANT
KIT TURNOVER CYSTO (KITS) ×1 IMPLANT
LASER FIB FLEXIVA PULSE ID 365 (Laser) IMPLANT
MANIFOLD NEPTUNE II (INSTRUMENTS) ×1 IMPLANT
NS IRRIG 500ML POUR BTL (IV SOLUTION) ×1 IMPLANT
PACK CYSTO (CUSTOM PROCEDURE TRAY) ×1 IMPLANT
SHEATH NAVIGATOR HD 11/13X36 (SHEATH) IMPLANT
SLEEVE SCD COMPRESS KNEE MED (STOCKING) ×1 IMPLANT
STENT CONTOUR 7FRX24 (STENTS) IMPLANT
TRACTIP FLEXIVA PULS ID 200XHI (Laser) IMPLANT
TRACTIP FLEXIVA PULSE ID 200 (Laser) ×1
TUBE CONNECTING 12X1/4 (SUCTIONS) IMPLANT
TUBING UROLOGY SET (TUBING) IMPLANT

## 2022-12-04 NOTE — Anesthesia Procedure Notes (Signed)
Procedure Name: LMA Insertion Date/Time: 12/04/2022 11:10 AM  Performed by: Rogers Blocker, CRNAPre-anesthesia Checklist: Patient identified, Emergency Drugs available, Suction available and Patient being monitored Patient Re-evaluated:Patient Re-evaluated prior to induction Oxygen Delivery Method: Circle System Utilized Preoxygenation: Pre-oxygenation with 100% oxygen Induction Type: IV induction Ventilation: Mask ventilation without difficulty LMA: LMA inserted LMA Size: 4.0 Number of attempts: 1 Airway Equipment and Method: Bite block Placement Confirmation: positive ETCO2 Tube secured with: Tape Dental Injury: Teeth and Oropharynx as per pre-operative assessment

## 2022-12-04 NOTE — Anesthesia Postprocedure Evaluation (Signed)
Anesthesia Post Note  Patient: [name]  Procedure(s) Performed: CYSTOSCOPY/URETEROSCOPY/  HOLMIUM LASER OF RENAL PELVIC LESIONS (Left) CYSTOSCOPY WITH STENT REPLACEMENT (Left)     Patient location during evaluation: PACU Anesthesia Type: General Level of consciousness: awake and alert Pain management: pain level controlled Vital Signs Assessment: post-procedure vital signs reviewed and stable Respiratory status: spontaneous breathing, nonlabored ventilation, respiratory function stable and patient connected to nasal cannula oxygen Cardiovascular status: blood pressure returned to baseline and stable Postop Assessment: no apparent nausea or vomiting Anesthetic complications: no  No notable events documented.  Last Vitals:  Vitals:   12/04/22 1353 12/04/22 1400  BP: (!) 164/77 (!) 151/63  Pulse: 64 (!) 54  Resp: 17   Temp: 36.6 C   SpO2: 94%     Last Pain:  Vitals:   12/04/22 1353  TempSrc:   PainSc: 3                  Barnet Glasgow

## 2022-12-04 NOTE — Transfer of Care (Signed)
Immediate Anesthesia Transfer of Care Note  Patient: SARAHANN PINTOR  Procedure(s) Performed: CYSTOSCOPY/URETEROSCOPY/  HOLMIUM LASER OF RENAL PELVIC LESIONS (Left) CYSTOSCOPY WITH STENT REPLACEMENT (Left)  Patient Location: PACU  Anesthesia Type:General  Level of Consciousness: drowsy, patient cooperative, and responds to stimulation  Airway & Oxygen Therapy: Pt with spontaneous respirations  Post-op Assessment: Report given to RN and Post -op Vital signs reviewed and stable  Post vital signs: Reviewed and stable  Last Vitals:  Vitals Value Taken Time  BP 154/69 12/04/22 1147  Temp    Pulse 73 12/04/22 1150  Resp 14 12/04/22 1150  SpO2 91 % 12/04/22 1150  Vitals shown include unvalidated device data.  Last Pain:  Vitals:   12/04/22 0936  TempSrc: Oral  PainSc: 0-No pain      Patients Stated Pain Goal: 6 ([phone] [id number])  Complications: No notable events documented.

## 2022-12-04 NOTE — Interval H&P Note (Signed)
History and Physical Interval Note:  12/04/2022 10:55 AM  [name]  has presented today for surgery, with the diagnosis of TRANSITIONAL CELL CARCINOMA RIGHT KIDNEY.  The various methods of treatment have been discussed with the patient and family. After consideration of risks, benefits and other options for treatment, the patient has consented to  Procedure(s): CYSTOSCOPY/URETEROSCOPY/ POSSIBLE HOLMIUM LASER OF RENAL PELVIC LESIONS (Left) CYSTOSCOPY WITH STENT REPLACEMENT (Left) as a surgical intervention.  The patient's history has been reviewed, patient examined, no change in status, stable for surgery.  I have reviewed the patient's chart and labs.  Questions were answered to the patient's satisfaction.     Lillette Boxer Kimblery Diop

## 2022-12-04 NOTE — Op Note (Signed)
Preoperative diagnosis: Solitary kidney with history of urothelial carcinomaWith the ureter and pyelocalyceal system  Postoperative diagnosis: Same with small lesions within the calyceal system on the left (total diameter approximately 1 cm)  Principal procedure: Cystoscopy, left double-J stent exchange (7 [place] by 24 contour without tether), laser ablation of the small urothelial lesions  Surgeon: Indiana Pechacek  Anesthesia: General with LMA  Complications: None  Estimated blood loss less than 10 mL  Specimen: Washings from left pyelocalyceal system, 4 cytology  Indications: 77 year old female with extensive history of urothelial carcinoma.  She has a solitary left kidney, having underwent, years ago, right nephro ureterectomy for urothelial carcinoma of the right distal ureter.  She presents at this time, with a history of recurrence in her ureter and pyelocalyceal system on the left, for definitive renoscopy.  2 weeks ago she had ureteroscopy with no evident lesions and negative cytology from washings of the ureter.  However, upon access catheter placement in the renal pelvis, there was pyuria.  She previously has been treated with Diflucan and antibiotics for urine culture positive.  The culture from a specimen from her renal pelvis at that time was negative.  She has been on continued antibiotics and presents at this time for repeat procedure, notably inspection and possible treatment of left renal pelvic lesions.  She is aware of the procedure, risk, complications and desires to proceed.  Findings: For the most part, there were only 2-3 small lesions within the pyelocalyceal system that were not biopsied.  These were in the upper pole with a small 2 to 3 mm lesion located medially.  2 larger lesions, each approximately 4 to 5 mm were in the lower pole calyceal system.  There was a piece of necrotic tissue floating within one of the calyces as well.  This was not attached to the urothelium.   Generally, the pyelocalyceal system.  Better than expected.  Description of procedure: Patient properly identified and marked in the holding area.  Taken to the operating room where general anesthetic was administered with the LMA.  Gentamicin was administered intravenously and timeout was performed after the patient was placed in the dorsolithotomy position, prepped and draped.  Cystoscope advanced into the bladder, the bladder was inspected no urothelial lesions were noted.  Stent was easily grasped and brought out through the meatus.  Sensor tip guidewire was advanced through the double-J stent, with a curl seen in the upper pole calyx.  I then removed the cystoscope and the open-ended catheter.  The ureter was first dilated with the inner core, then the entire 11/13 medium length ureteral access catheter.  Safety wire was placed.  I then negotiated the flexible ureteroscope into the pyelocalyceal system where circumferential/systematic inspection was performed.  There was a small necrotic piece of tissue approximately 8 mm in size in one of the calyces.  This was not attached to the urothelium.  3 small lesions were noted which were not papillary in nature, but still slightly raised and not erythematous.  With her history of positive cytology, I felt that they could possibly be urothelial carcinomas.  Prior to laser ablation of these, washings were taken using saline from the pyelocalyceal system and sent for cytology labeled "left renal washings".  I then negotiated a 200 m fiber through the scope, and all of these lesions were ablated using holmium laser at 1.8 J and 40 Hz.  A small amount of bleeding was noted after the its were totally ablated.  Before I finished, I  reinspected the pyelocalyceal system.  No further lesions were noted.  The scope was then backed out.  The access catheter was also removed.  The safety wire was backloaded through the cystoscope, and a 7 [place] by 24 cm contour double-J  stent with a tether removed was placed.  Once adequately positioned, the guidewire was removed and excellent proximal and distal curls were seen using fluoroscopy and cystoscopy, respectively.  The bladder was drained.  The scope was then removed.  The patient was then awakened and taken to the PACU in stable condition, having tolerated the procedure well.

## 2022-12-04 NOTE — Discharge Instructions (Addendum)
You may see some blood in the urine and may have some burning with urination for 48-72 hours. You also may notice that you have to urinate more frequently or urgently after your procedure which is normal.  You should call should you develop an inability urinate, fever > 101, persistent nausea and vomiting that prevents you from eating or drinking to stay hydrated.  If you have a stent, you will likely urinate more frequently and urgently until the stent is removed and you may experience some discomfort/pain in the lower abdomen and flank especially when urinating. You may take pain medication prescribed to you if needed for pain. You may also intermittently have blood in the urine until the stent is removed.    CYSTOSCOPY HOME CARE INSTRUCTIONS  Activity: Rest for the remainder of the day.  Do not drive or operate equipment today.  You may resume normal activities in one to two days as instructed by your physician.   Meals: Drink plenty of liquids and eat light foods such as gelatin or soup this evening.  You may return to a normal meal plan tomorrow.  Return to Work: You may return to work in one to two days or as instructed by your physician.  Special Instructions / Symptoms: Call your physician if any of these symptoms occur:   -persistent or heavy bleeding  -bleeding which continues after first few urination  -large blood clots that are difficult to pass  -urine stream diminishes or stops completely  -fever equal to or higher than 101 degrees Farenheit.  -cloudy urine with a strong, foul odor  -severe pain  Females should always wipe from front to back after elimination.  You may feel some burning pain when you urinate.  This should disappear with time.  Applying moist heat to the lower abdomen or a hot tub bath may help relieve the pain. \  Follow-Up / Date of Return Visit to Your Physician:  Call for an appointment to arrange follow-up.   Post Anesthesia Home Care  Instructions  Activity: Get plenty of rest for the remainder of the day. A responsible individual must stay with you for 24 hours following the procedure.  For the next 24 hours, DO NOT: -Drive a car -Paediatric nurse -Drink alcoholic beverages -Take any medication unless instructed by your physician -Make any legal decisions or sign important papers.  Meals: Start with liquid foods such as gelatin or soup. Progress to regular foods as tolerated. Avoid greasy, spicy, heavy foods. If nausea and/or vomiting occur, drink only clear liquids until the nausea and/or vomiting subsides. Call your physician if vomiting continues.  Special Instructions/Symptoms: Your throat may feel dry or sore from the anesthesia or the breathing tube placed in your throat during surgery. If this causes discomfort, gargle with warm salt water. The discomfort should disappear within 24 hours.

## 2022-12-04 NOTE — H&P (Signed)
H&P  Chief Complaint: Urothelial carcinoma of the left upper urinary tract  History of Present Illness: HESTER [name] is a 77 y.o. year old female presenting at this time for cystoscopy, ureteroscopic evaluation of the left pyelocalyceal system and holmium laser management of any urothelial lesions.  She underwent recent procedure 2 weeks ago.  Once access to the left pyelocalyceal system was gained by ureteral access catheter, purulent urine came out.  The procedure was aborted and the stent was placed.  She presents for repeat procedure at this time.  She did have a negative culture from that last procedure but she has been adequately managed with suppressive antibiotics.  Past Medical History:  Diagnosis Date   Anemia associated with chronic renal failure    CAD in native artery 10/2019   cardiologist-   dr Jerilynn Mages. [place] [facility];   a. 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 ([place]) 06/2019   urologist--- dr Farooq Petrovich/  oncologist--- dr Alen Blew;   dx 09/ 2020 high grade urothelial  s/p laser ablation, ;  recurrent s/p BCG instillation,  completed chemo instilation 11/ 2022  and repeat chemo completed 05/ 2023   Chronic combined systolic and diastolic CHF (congestive heart failure) ([place]) 06/2019   followed by cardiology;   dx 09/ 2022 in setting of sepsis, pulm edema;   05/ 2022  ef 40-45% per echo,  recovered per cath 01/ 2021 ef 50%;  laste echo 08/ 2023  ef 60-65%   CKD (chronic kidney disease), stage III ([place])    Depression    GAD (generalized anxiety disorder)    GERD (gastroesophageal reflux disease)    History of bladder cancer 12/2006   followed by [name]   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   dx 04/ 2008  w/ poor function hydronephrotic  right kidney;   02-06-2007  s/p right nephroureterectomy   History of pulmonary embolism 04/2022   in setting severe sepsis;  small RLL, treated w/ 3  months eliquis   Hyperlipidemia, mixed    Hypertension    Solitary kidney, acquired 02/06/2007   s/p  right nephroureterectomy for cancer    Past Surgical History:  Procedure Laterality Date   CATARACT EXTRACTION W/ INTRAOCULAR LENS IMPLANT Bilateral 2011   CYSTOSCOPY W/ URETERAL STENT PLACEMENT Left 11/17/2019   Procedure: CYSTOSCOPY WITH STENT REPLACEMENT;  Surgeon: Franchot Gallo, MD;  Location: [facility];  Service: Urology;  Laterality: Left;   CYSTOSCOPY W/ URETERAL STENT PLACEMENT Left 03/23/2022   Procedure: CYSTOSCOPY WITH RETROGRADE PYELOGRAM, LEFT URETERAL STENT PLACEMENT;  Surgeon: Franchot Gallo, MD;  Location: [facility];  Service: Urology;  Laterality: Left;   CYSTOSCOPY W/ URETERAL STENT PLACEMENT Left 06/09/2022   Procedure: CYSTOSCOPY WITH LEFT STENT EXCHANGE;  Surgeon: Franchot Gallo, MD;  Location: [facility];  Service: Urology;  Laterality: Left;   CYSTOSCOPY W/ URETERAL STENT REMOVAL Left 05/10/2020   Procedure: CYSTOSCOPY WITH STENT REMOVAL;  Surgeon: Franchot Gallo, MD;  Location: [facility];  Service: Urology;  Laterality: Left;   CYSTOSCOPY W/ URETERAL STENT REMOVAL Left 05/02/2021   Procedure: CYSTOSCOPY WITH STENT REMOVAL;  Surgeon: Franchot Gallo, MD;  Location: [facility];  Service: Urology;  Laterality: Left;   CYSTOSCOPY W/ URETERAL STENT REMOVAL Left 11/16/2022   Procedure: CYSTOSCOPY WITH STENT REMOVAL;  Surgeon: Franchot Gallo, MD;  Location: [facility];  Service: Urology;  Laterality: Left;  CYSTOSCOPY WITH RETROGRADE PYELOGRAM, URETEROSCOPY AND STENT PLACEMENT Left 06/27/2019   Procedure: CYSTOSCOPY WITH LEFT RETROGRADE PYELOGRAM, URETEROSCOPY, BIOPSY AND LEFT STENT PLACEMENT;  Surgeon: Irine Seal, MD;  Location: [facility];  Service: Urology;  Laterality: Left;   CYSTOSCOPY WITH RETROGRADE PYELOGRAM, URETEROSCOPY AND STENT PLACEMENT Left 08/18/2019    Procedure: CYSTOSCOPY, URETEROSCOPY AND STENT EXCHANGE;  Surgeon: Franchot Gallo, MD;  Location: [facility];  Service: Urology;  Laterality: Left;  60 MINS   CYSTOSCOPY WITH RETROGRADE PYELOGRAM, URETEROSCOPY AND STENT PLACEMENT Left 11/17/2019   Procedure: CYSTOSCOPY WITH RETROGRADE PYELOGRAM, URETEROSCOPY AND STENT PLACEMENT;  Surgeon: Franchot Gallo, MD;  Location: [facility];  Service: Urology;  Laterality: Left;  [address] [place], URETEROSCOPY AND STENT PLACEMENT Left 05/10/2020   Procedure: CYSTOSCOPY WITH RETROGRADE PYELOGRAM, URETEROSCOPY AND STENT PLACEMENT WITH URETHRAL DIALATION AND BRUSH BIOPSY;  Surgeon: Franchot Gallo, MD;  Location: [facility];  Service: Urology;  Laterality: Left;  1 HR   CYSTOSCOPY WITH RETROGRADE PYELOGRAM, URETEROSCOPY AND STENT PLACEMENT Left 09/16/2020   Procedure: CYSTOSCOPY WITH RETROGRADE PYELOGRAM, URETEROSCOPY ,  LITHOPAXY, LEFT URETERAL BRUSHING, AND STENT REPLACEMENT;  Surgeon: Franchot Gallo, MD;  Location: [facility];  Service: Urology;  Laterality: Left;   CYSTOSCOPY WITH RETROGRADE PYELOGRAM, URETEROSCOPY AND STENT PLACEMENT Left 10/24/2021   Procedure: CYSTOSCOPY WITH RETROGRADE PYELOGRAM, URETEROSCOPY, POSSIBLE URETERAL AND RENAL BIOPSIES AND STENT PLACEMENT;  Surgeon: Franchot Gallo, MD;  Location: [facility];  Service: Urology;  Laterality: Left;  1 HR   CYSTOSCOPY WITH RETROGRADE PYELOGRAM, URETEROSCOPY AND STENT PLACEMENT Left 03/09/2022   Procedure: CYSTOSCOPY WITH ANTEGRADE PYELOGRAM,  STENT REMOVAL;  Surgeon: Franchot Gallo, MD;  Location: [facility];  Service: Urology;  Laterality: Left;   CYSTOSCOPY WITH RETROGRADE PYELOGRAM, URETEROSCOPY AND STENT PLACEMENT  01/09/2007   '@[facility]'$  by [name];    bx's/ washing  bladder , right ureter   CYSTOSCOPY WITH RETROGRADE PYELOGRAM, URETEROSCOPY AND STENT PLACEMENT Left  11/16/2022   Procedure: CYSTOSCOPY WITH RETROGRADE PYELOGRAM, URETEROSCOPY AND STENT REPLACEMENT;  Surgeon: Franchot Gallo, MD;  Location: [facility];  Service: Urology;  Laterality: Left;  90 MINS   CYSTOSCOPY/URETEROSCOPY/HOLMIUM LASER/STENT PLACEMENT Left 05/02/2021   Procedure: CYSTOSCOPY/URETEROSCOPY WITH BRUSH BIOPSY/ RETROGRADE PYELOGRAM/ HOLMIUM LASER/STENT REPLACEMENT;  Surgeon: Franchot Gallo, MD;  Location: [facility];  Service: Urology;  Laterality: Left;   HOLMIUM LASER APPLICATION Left [id number]   Procedure: HOLMIUM LASER APPLICATION;  Surgeon: Franchot Gallo, MD;  Location: [facility];  Service: Urology;  Laterality: Left;   HOLMIUM LASER APPLICATION Left [id number]   Procedure: HOLMIUM LASER APPLICATION OF TUMORS;  Surgeon: Franchot Gallo, MD;  Location: [facility];  Service: Urology;  Laterality: Left;   HOLMIUM LASER APPLICATION Left [id number]   Procedure: HOLMIUM LASER OF TUMORS;  Surgeon: Franchot Gallo, MD;  Location: [facility];  Service: Urology;  Laterality: Left;   IR NEPHROSTOMY PLACEMENT LEFT  07/06/2021   IR NEPHROSTOMY PLACEMENT LEFT  02/06/2022   NEPHROSTOMY TUBE REMOVAL Left 03/23/2022   Procedure: NEPHROSTOMY TUBE REMOVAL;  Surgeon: Franchot Gallo, MD;  Location: [facility];  Service: Urology;  Laterality: Left;   NEPHROURETERECTOMY Right 02/06/2007   '@[facility]'$  by [name];   Laparoscopic   RIGHT/LEFT HEART CATH AND CORONARY ANGIOGRAPHY N/A 11/06/2019   Procedure: RIGHT/LEFT HEART CATH AND CORONARY ANGIOGRAPHY;  Surgeon: Belva Crome, MD;  Location: [facility];  Service: Cardiovascular;  Laterality:  N/A;   THULIUM LASER TURP (TRANSURETHRAL RESECTION OF PROSTATE) Left 08/18/2019   Procedure: THULIUM LASER ABLATION OF URETERAL TUMOR;  Surgeon: Franchot Gallo, MD;  Location: [facility];  Service: Urology;  Laterality: Left;   THULIUM LASER TURP  (TRANSURETHRAL RESECTION OF PROSTATE) Left 11/17/2019   Procedure: THULIUM LASER of URETERAL CANCER;  Surgeon: Franchot Gallo, MD;  Location: [facility];  Service: Urology;  Laterality: Left;   TRANSURETHRAL RESECTION OF BLADDER TUMOR  12/12/2006   '@[facility]'$  by dr Terance Hart   VAGINAL HYSTERECTOMY  1980    Home Medications:  No medications prior to admission.    Allergies:  Allergies  Allergen Reactions   Erythromycin Diarrhea and Nausea And Vomiting   Lotensin [Benazepril Hcl] Cough   Macrodantin [Nitrofurantoin] Diarrhea    And night sweats    Family History  Problem Relation Age of Onset   Hypertension Mother    Diabetes Mellitus II Sister    Hypertension Sister    Hypertension Brother     Social History:  reports that she quit smoking about 19 years ago. Her smoking use included cigarettes. She has a 19.00 pack-year smoking history. She has never used smokeless tobacco. She reports that she does not currently use alcohol. She reports that she does not use drugs.  ROS: A complete review of systems was performed.  All systems are negative except for pertinent findings as noted.  Physical Exam:  Vital signs in last 24 hours:   General:  Alert and oriented, No acute distress HEENT: Normocephalic, atraumatic Neck: No JVD or lymphadenopathy Cardiovascular: Regular rate  Lungs: Normal inspiratory/expiratory excursion Abdomen: Soft, nontender, nondistended, no abdominal masses Back: No CVA tenderness Extremities: No edema Neurologic: Grossly intact  I have reviewed prior pt notes  I have reviewed notes from referring/previous physicians  I have reviewed urinalysis results  I have independently reviewed prior imaging  I have reviewed prior urine culture   Impression/Assessment:  Urothelial carcinoma of the left upper tract and a solitary kidney  Plan:  Cystoscopy, left double-J stent exchange, left ureteroscopy and holmium laser management of any  urothelial lesions.  Lillette Boxer [place] 12/04/2022, 8:31 AM  Lillette Boxer. Dionysios Massman MD

## 2022-12-05 ENCOUNTER — Telehealth: Payer: Self-pay | Admitting: Hematology

## 2022-12-05 ENCOUNTER — Encounter (HOSPITAL_BASED_OUTPATIENT_CLINIC_OR_DEPARTMENT_OTHER): Payer: Self-pay | Admitting: Urology

## 2022-12-05 NOTE — Telephone Encounter (Signed)
Per 2/27 IB reached out to reschedule patient, left voicemail.

## 2022-12-06 ENCOUNTER — Telehealth: Payer: Self-pay | Admitting: Hematology

## 2022-12-06 LAB — CYTOLOGY - NON PAP

## 2022-12-06 NOTE — Telephone Encounter (Signed)
Called patient per 2/28 IB message to reschedule missed appointments. Patient rescheduled and notified.

## 2022-12-26 NOTE — Progress Notes (Signed)
Cardiology Office Note:    Date:  01/09/2023   ID:  [name], DOB 1946/09/30, MRN [id number]  PCP:  Heather Smoker, MD  [facility] Cardiologist:  Werner Lean, MD     Referring MD: [name], *   Chief Complaint:  Follow-up     History of Present Illness:   [name] is a 77 y.o. female with a hx of moderate nonobstructive CAD (70% Diag disease 11/05/20), HFrEF, HTN and HLD,      Patient last saw Dr. Gasper [name] 12/21/21 and doing well. Hydralazine decreased 25 mg tid and may eventually decrease bid.  Patient comes in for f/u. PCP stopped hydralazine b/c of orthostatic hypotension. Denies chest pain, dyspnea, dizziness, edema. Had lost some weight but gained it back. No regular exercise.  Only has 1 kidney and has stents placed. Crt 2.2 01/03/23.      Past Medical History:  Diagnosis Date   Anemia associated with chronic renal failure    CAD in native artery 10/2019   cardiologist-   dr [name]. [name];   a. 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   urologist--- dr dahlstedt/  oncologist--- dr [name];   dx 09/ 2020 high grade urothelial  s/p laser ablation, ;  recurrent s/p BCG instillation,  completed chemo instilation 11/ 2022  and repeat chemo completed 05/ 2023   Chronic combined systolic and diastolic CHF (congestive heart failure) 06/2019   followed by cardiology;   dx 09/ 2022 in setting of sepsis, pulm edema;   05/ 2022  ef 40-45% per echo,  recovered per cath 01/ 2021 ef 50%;  laste echo 08/ 2023  ef 60-65%   CKD (chronic kidney disease), stage III    Depression    GAD (generalized anxiety disorder)    GERD (gastroesophageal reflux disease)    History of bladder cancer 12/2006   followed by dr [name]   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   dx 04/ 2008  w/ poor function hydronephrotic  right kidney;    02-06-2007  s/p right nephroureterectomy   History of pulmonary embolism 04/2022   in setting severe sepsis;  small RLL, treated w/ 3 months eliquis   Hyperlipidemia, mixed    Hypertension    Solitary kidney, acquired 02/06/2007   s/p  right nephroureterectomy for cancer   Current Medications: Current Meds  Medication Sig   acetaminophen (TYLENOL) 500 MG tablet Take 500-1,000 mg by mouth every 6 (six) hours as needed for mild pain.   aspirin EC 81 MG tablet Take 1 tablet (81 mg total) by mouth daily. Swallow whole.   calcium carbonate (TUMS EX) 750 MG chewable tablet Chew 1 tablet by mouth every morning.   carvedilol (COREG) 6.25 MG tablet TAKE 1 TABLET(6.25 MG) BY MOUTH TWICE DAILY WITH A MEAL (Patient taking differently: Take 6.25 mg by mouth 2 (two) times daily with a meal.)   cholecalciferol (VITAMIN D3) 25 MCG (1000 UNIT) tablet Take 1,000 Units by mouth every morning.   Ferrous Sulfate (IRON PO) Take 1 tablet by mouth daily.   isosorbide mononitrate (IMDUR) 30 MG 24 hr tablet TAKE 1/2 TABLET(15 MG) BY MOUTH DAILY   ondansetron (ZOFRAN) 8 MG tablet Take 8 mg by mouth 3 (three) times daily as needed for vomiting or nausea.   oxybutynin (DITROPAN) 5 MG tablet Take 5 mg by mouth 2 (two)  times daily.   polyethylene glycol (MIRALAX / GLYCOLAX) 17 g packet Take 17 g by mouth daily. (Patient taking differently: Take 17 g by mouth daily as needed (constipation).)   rosuvastatin (CRESTOR) 40 MG tablet Take 1 tablet (40 mg total) by mouth daily. (Patient taking differently: Take 40 mg by mouth daily.)   sertraline (ZOLOFT) 50 MG tablet Take 50 mg by mouth every morning.   traZODone (DESYREL) 150 MG tablet Take 150 mg by mouth at bedtime.    Allergies:   Erythromycin, Lotensin [benazepril hcl], and Macrodantin [nitrofurantoin]   Social History   Tobacco Use   Smoking status: Former    Packs/day: 0.50    Years: 38.00    Additional pack years: 0.00    Total pack years: 19.00    Types:  Cigarettes    Quit date: 11/26/2003    Years since quitting: 19.1   Smokeless tobacco: Never  Vaping Use   Vaping Use: Never used  Substance Use Topics   Alcohol use: Not Currently   Drug use: Never    Family Hx: The patient's family history includes Diabetes Mellitus II in her sister; Hypertension in her brother, mother, and sister.  ROS     Physical Exam:    VS:  BP 136/72   Pulse 81   Ht 5\' 2"  (1.575 m)   Wt 165 lb 6.4 oz (75 kg)   SpO2 96%   BMI 30.25 kg/m     Wt Readings from Last 3 Encounters:  01/09/23 165 lb 6.4 oz (75 kg)  12/04/22 159 lb 1.6 oz (72.2 kg)  11/16/22 158 lb 6.4 oz (71.8 kg)    Physical Exam  GEN: Well nourished, well developed, in no acute distress  Neck: no JVD, carotid bruits, or masses Cardiac:RRR; no murmurs, rubs, or gallops  Respiratory:  clear to auscultation bilaterally, normal work of breathing GI: soft, nontender, nondistended, + BS Ext: without cyanosis, clubbing, or edema, Good distal pulses bilaterally Neuro:  Alert and Oriented x 3,  Psych: euthymic mood, full affect        EKGs/Labs/Other Test Reviewed:    EKG:  EKG is  not ordered today.    Recent Labs: 04/21/2022: B Natriuretic Peptide 156.6; TSH 0.242 05/30/2022: ALT 26 05/31/2022: Magnesium 1.8 11/17/2022: Platelets 236 12/04/2022: BUN 35; Creatinine, Ser 2.40; Hemoglobin 12.6; Potassium 4.5; Sodium 140   Recent Lipid Panel Recent Labs    02/20/22 0818  CHOL 106  TRIG 116  HDL 41  LDLCALC 44     Prior CV Studies:   Transthoracic Echocardiogram: Date: 02/16/2021 Results:  1. Left ventricular ejection fraction, by estimation, is 70 to 75%. The  left ventricle has hyperdynamic function. The left ventricle has no  regional wall motion abnormalities. Left ventricular diastolic parameters  are consistent with Grade I diastolic  dysfunction (impaired relaxation).   2. Right ventricular systolic function is normal. The right ventricular  size is normal. There is  normal pulmonary artery systolic pressure. The  estimated right ventricular systolic pressure is [id number] mmHg.   3. The mitral valve is normal in structure. No evidence of mitral valve  regurgitation. No evidence of mitral stenosis.   4. The aortic valve is calcified. There is mild calcification of the  aortic valve. There is mild thickening of the aortic valve. Aortic valve  regurgitation is not visualized. Mild aortic valve sclerosis is present,  with no evidence of aortic valve  stenosis.   5. The inferior vena cava is normal in  size with greater than 50%  respiratory variability, suggesting right atrial pressure of 3 mmHg.    ECG or NM Stress Testing : Date: 06/30/19 Results: There was no ST segment deviation noted during stress. No T wave inversion was noted during stress. Defect 1: There is a medium defect of mild severity present in the mid anteroseptal, mid inferolateral, apical anterior, apical septal and apical lateral location. This is a low risk study. Nuclear stress EF: 63%. The left ventricular ejection fraction is normal (55-65%). No prior study for comparison.   There is a mild to moderate defect extending from mid anteroseptum to inferolateral wall. It improves with stress, which is not consistent with ischemia. Normal wall motion in this area. Not consistent with single coronary distrubution. However, TID is 1.21, which means balanced ischemia in multiple territories. cannot be excluded     Left/Right Heart Catheterizations: Date: 11/06/19 Results: Normal right heart pressures. The left main is short, and widely patent. Ostial to proximal LAD diffuse 40 to 50% narrowing. A moderate to large diagonal #1 contains 40 to 50% proximal segmental narrowing, mid 75% narrowing after a bifurcation, and more distal 60% narrowing. The circumflex gives 3 obtuse marginal branches. The first moderate-sized obtuse marginal contains ostial eccentric 50 to 60% narrowing. The RCA contains  eccentric 30 to 40% mid narrowing. No significant obstruction is noted otherwise. Normal left ventricular systolic function. EF greater than 50%. LVEDP is normal.   RECOMMENDATIONS:   The patient has moderate coronary disease, especially involving the first diagonal. The diagonal is relatively diffusely diseased but could be treated with PCI although it perhaps a slightly increased risk of complications due to the diffuse nature of disease. There is no disease that would account for prolonged pain chest pain at rest. If symptoms become nitroglycerin responsive consider diagonal PCI if refractory and impacting quality of life. Aggressive risk factor modification       Risk Assessment/Calculations/Metrics:              ASSESSMENT & PLAN:   No problem-specific Assessment & Plan notes found for this encounter.   CAD 70% Diag 11/05/20 on  Rosuvastatin, BB. She's been off ASA but not sure why. Restart 81 mg unless PCP/renal want her off. No angina. 150 min exercise weekly  HFrEF possibly hypertensive on coreg, and imdur. Hydralazine stopped due to low BP's  HTN well controlled on lower dose meds  HLD on crestor. Due for FLP-she'll have at PCP f/u  CKD-only one kidney and has stents placed followed by renal. Crt 2.2 01/03/23            Dispo:  No follow-ups on file.   Medication Adjustments/Labs and Tests Ordered: Current medicines are reviewed at length with the patient today.  Concerns regarding medicines are outlined above.  Tests Ordered: No orders of the defined types were placed in this encounter.  Medication Changes: Meds ordered this encounter  Medications   aspirin EC 81 MG tablet    Sig: Take 1 tablet (81 mg total) by mouth daily. Swallow whole.    Dispense:  90 tablet    Refill:  3   Signed, Ermalinda Barrios, PA-C  01/09/2023 12:46 PM    [facility] [address], [place], [place]  [address] Phone: [phone]; Fax: [phone]

## 2023-01-03 DIAGNOSIS — C652 Malignant neoplasm of left renal pelvis: Secondary | ICD-10-CM | POA: Diagnosis not present

## 2023-01-03 DIAGNOSIS — R8279 Other abnormal findings on microbiological examination of urine: Secondary | ICD-10-CM | POA: Diagnosis not present

## 2023-01-03 DIAGNOSIS — C662 Malignant neoplasm of left ureter: Secondary | ICD-10-CM | POA: Diagnosis not present

## 2023-01-09 ENCOUNTER — Telehealth: Payer: Self-pay | Admitting: Internal Medicine

## 2023-01-09 ENCOUNTER — Encounter: Payer: Self-pay | Admitting: Physician Assistant

## 2023-01-09 ENCOUNTER — Ambulatory Visit: Payer: Medicare PPO | Attending: Physician Assistant | Admitting: Physician Assistant

## 2023-01-09 VITALS — BP 136/72 | HR 81 | Ht 62.0 in | Wt 165.4 lb

## 2023-01-09 DIAGNOSIS — E782 Mixed hyperlipidemia: Secondary | ICD-10-CM | POA: Diagnosis not present

## 2023-01-09 DIAGNOSIS — I2510 Atherosclerotic heart disease of native coronary artery without angina pectoris: Secondary | ICD-10-CM | POA: Diagnosis not present

## 2023-01-09 DIAGNOSIS — I10 Essential (primary) hypertension: Secondary | ICD-10-CM

## 2023-01-09 DIAGNOSIS — N183 Chronic kidney disease, stage 3 unspecified: Secondary | ICD-10-CM

## 2023-01-09 DIAGNOSIS — I5020 Unspecified systolic (congestive) heart failure: Secondary | ICD-10-CM | POA: Diagnosis not present

## 2023-01-09 MED ORDER — ASPIRIN 81 MG PO TBEC: 81.0000 mg | DELAYED_RELEASE_TABLET | Freq: Every day | ORAL | 3 refills | Status: AC

## 2023-01-09 NOTE — Patient Instructions (Addendum)
Medication Instructions:  Your physician has recommended you make the following change in your medication:  RESTART ASPIRIN 81 MG DAILY   *If you need a refill on your cardiac medications before your next appointment, please call your pharmacy*   Lab Work: NONE If you have labs (blood work) drawn today and your tests are completely normal, you will receive your results only by: [place] (if you have MyChart) OR A paper copy in the mail If you have any lab test that is abnormal or we need to change your treatment, we will call you to review the results.   Testing/Procedures: NONE   Follow-Up: At [facility], you and your health needs are our priority.  As part of our continuing mission to provide you with exceptional heart care, we have created designated Provider Care Teams.  These Care Teams include your primary Cardiologist (physician) and Advanced Practice Providers (APPs -  Physician Assistants and Nurse Practitioners) who all work together to provide you with the care you need, when you need it.  We recommend signing up for the patient portal called "MyChart".  Sign up information is provided on this After Visit Summary.  MyChart is used to connect with patients for Virtual Visits (Telemedicine).  Patients are able to view lab/test results, encounter notes, upcoming appointments, etc.  Non-urgent messages can be sent to your provider as well.   To learn more about what you can do with MyChart, go to [email or web address].    Your next appointment:   1 year(s)  Provider:   Werner Lean, MD    Other Instructions YOUR PROVIDER RECOMMENDS THAT YOU DO 150 MINS OF EXERCISE WEEKLY

## 2023-01-09 NOTE — Telephone Encounter (Signed)
Patient states that she was told she would need labs today at her appt, and was told she could have them done at her PCP office. She states that she will not be able to see her PCP until September and would like to know if an order could be done for cardiologist office for labs requested. Please advise.

## 2023-01-10 NOTE — Telephone Encounter (Signed)
Patient was returning call. Please advise ?

## 2023-01-10 NOTE — Telephone Encounter (Signed)
Spoke with patient who is agreeable to come in to fasting lipids 4/4 okay per Ermalinda Barrios, PA-C. Patient thanked me for returning her call.

## 2023-01-10 NOTE — Telephone Encounter (Signed)
Returned patient's call and left a message to call our office back to schedule lab work.

## 2023-01-11 ENCOUNTER — Ambulatory Visit: Payer: Medicare PPO | Attending: Physician Assistant

## 2023-01-11 DIAGNOSIS — E782 Mixed hyperlipidemia: Secondary | ICD-10-CM | POA: Diagnosis not present

## 2023-01-11 DIAGNOSIS — I2510 Atherosclerotic heart disease of native coronary artery without angina pectoris: Secondary | ICD-10-CM | POA: Diagnosis not present

## 2023-01-11 DIAGNOSIS — I10 Essential (primary) hypertension: Secondary | ICD-10-CM | POA: Diagnosis not present

## 2023-01-12 LAB — LIPID PANEL
Chol/HDL Ratio: 3.3 ratio (ref 0.0–4.4)
Cholesterol, Total: 169 mg/dL (ref 100–199)
HDL: 51 mg/dL (ref >39)
LDL Chol Calc (NIH): 74 mg/dL (ref 0–99)
Triglycerides: 273 mg/dL — ABNORMAL HIGH (ref 0–149)
VLDL Cholesterol Cal: 44 mg/dL — ABNORMAL HIGH (ref 5–40)

## 2023-01-15 ENCOUNTER — Telehealth: Payer: Self-pay | Admitting: Internal Medicine

## 2023-01-15 ENCOUNTER — Telehealth: Payer: Self-pay | Admitting: Hematology

## 2023-01-15 DIAGNOSIS — E782 Mixed hyperlipidemia: Secondary | ICD-10-CM

## 2023-01-15 MED ORDER — ICOSAPENT ETHYL 1 G PO CAPS: 1.0000 g | ORAL_CAPSULE | Freq: Two times a day (BID) | ORAL | 11 refills | Status: DC

## 2023-01-15 NOTE — Telephone Encounter (Signed)
Patient is return call regarding results.

## 2023-01-15 NOTE — Telephone Encounter (Signed)
Patient is aware of lab results Rx sent to pharmacy.

## 2023-01-15 NOTE — Telephone Encounter (Signed)
Called patient regarding reschedule of 4/15 appointments. Left voicemail with new appointment information and contact details if needing to reschedule.

## 2023-01-22 ENCOUNTER — Inpatient Hospital Stay: Payer: Medicare PPO | Admitting: Hematology

## 2023-02-07 ENCOUNTER — Other Ambulatory Visit: Payer: Self-pay | Admitting: Internal Medicine

## 2023-02-12 ENCOUNTER — Inpatient Hospital Stay: Payer: Medicare PPO

## 2023-02-12 ENCOUNTER — Inpatient Hospital Stay: Payer: Medicare PPO | Attending: Hematology | Admitting: Hematology

## 2023-02-12 ENCOUNTER — Other Ambulatory Visit: Payer: Self-pay

## 2023-02-12 VITALS — BP 115/65 | HR 67 | Temp 98.0°F | Resp 18 | Wt 170.7 lb

## 2023-02-12 DIAGNOSIS — C689 Malignant neoplasm of urinary organ, unspecified: Secondary | ICD-10-CM | POA: Diagnosis not present

## 2023-02-12 DIAGNOSIS — D649 Anemia, unspecified: Secondary | ICD-10-CM | POA: Diagnosis not present

## 2023-02-12 DIAGNOSIS — Z905 Acquired absence of kidney: Secondary | ICD-10-CM | POA: Diagnosis not present

## 2023-02-12 DIAGNOSIS — Z87891 Personal history of nicotine dependence: Secondary | ICD-10-CM | POA: Insufficient documentation

## 2023-02-12 DIAGNOSIS — N189 Chronic kidney disease, unspecified: Secondary | ICD-10-CM | POA: Diagnosis not present

## 2023-02-12 DIAGNOSIS — Z79899 Other long term (current) drug therapy: Secondary | ICD-10-CM | POA: Diagnosis not present

## 2023-02-12 DIAGNOSIS — C679 Malignant neoplasm of bladder, unspecified: Secondary | ICD-10-CM | POA: Diagnosis not present

## 2023-02-12 LAB — CBC WITH DIFFERENTIAL (CANCER CENTER ONLY)
Abs Immature Granulocytes: 0.02 10*3/uL (ref 0.00–0.07)
Basophils Absolute: 0.1 10*3/uL (ref 0.0–0.1)
Basophils Relative: 1 %
Eosinophils Absolute: 0.5 10*3/uL (ref 0.0–0.5)
Eosinophils Relative: 6 %
HCT: 39.4 % (ref 36.0–46.0)
Hemoglobin: 12.7 g/dL (ref 12.0–15.0)
Immature Granulocytes: 0 %
Lymphocytes Relative: 35 %
Lymphs Abs: 2.6 10*3/uL (ref 0.7–4.0)
MCH: 31.1 pg (ref 26.0–34.0)
MCHC: 32.2 g/dL (ref 30.0–36.0)
MCV: 96.3 fL (ref 80.0–100.0)
Monocytes Absolute: 0.6 10*3/uL (ref 0.1–1.0)
Monocytes Relative: 8 %
Neutro Abs: 3.7 10*3/uL (ref 1.7–7.7)
Neutrophils Relative %: 50 %
Platelet Count: 193 10*3/uL (ref 150–400)
RBC: 4.09 MIL/uL (ref 3.87–5.11)
RDW: 14.7 % (ref 11.5–15.5)
WBC Count: 7.5 10*3/uL (ref 4.0–10.5)
nRBC: 0 % (ref 0.0–0.2)

## 2023-02-12 LAB — CMP (CANCER CENTER ONLY)
ALT: 10 U/L (ref 0–44)
AST: 13 U/L — ABNORMAL LOW (ref 15–41)
Albumin: 4 g/dL (ref 3.5–5.0)
Alkaline Phosphatase: 56 U/L (ref 38–126)
Anion gap: 8 (ref 5–15)
BUN: 37 mg/dL — ABNORMAL HIGH (ref 8–23)
CO2: 29 mmol/L (ref 22–32)
Calcium: 9.6 mg/dL (ref 8.9–10.3)
Chloride: 103 mmol/L (ref 98–111)
Creatinine: 2.23 mg/dL — ABNORMAL HIGH (ref 0.44–1.00)
GFR, Estimated: 23 mL/min — ABNORMAL LOW (ref >60)
Glucose, Bld: 96 mg/dL (ref 70–99)
Potassium: 3.9 mmol/L (ref 3.5–5.1)
Sodium: 140 mmol/L (ref 135–145)
Total Bilirubin: 0.3 mg/dL (ref 0.3–1.2)
Total Protein: 7.7 g/dL (ref 6.5–8.1)

## 2023-02-12 LAB — VITAMIN B12: Vitamin B-12: 302 pg/mL (ref 180–914)

## 2023-02-12 NOTE — Progress Notes (Signed)
HEMATOLOGY/ONCOLOGY CONSULTATION NOTE  Date of Service: 02/12/2023  Patient Care Team: Heather Hale, MD as PCP - General (Family Medicine) Heather Constant, MD as PCP - Cardiology (Cardiology)  CHIEF COMPLAINTS/PURPOSE OF CONSULTATION:  Urothelial carcinoma    Secondary diagnoses: high-grade urothelial carcinoma of the right distal ureter in 2008.   Prior Therapy:   She is status post right nephroureterectomy for a distal ureteral carcinoma in 2008   She is status post cystoscopy and a stent placement in September 2020 and repeated in February 2021 for a left ureteral tumor.  The cytology showed high-grade urothelial carcinoma.   She is status post BCG treatment in 2021.  Subsequent cystoscopy in 2022 showed atypia without obvious urothelial carcinoma.  In July 2022 cytology showed high-grade urothelial carcinoma.   She is status post Jelmyto instillation under the care of Heather. [name] in September 2022 was completed in November 2022.  Following cystoscopy in January 2023 did show some residual tumor by cytology.   In May 2023 she completed repeat Jelmyto infusion.      Current therapy: Intermittent cystoscopy and tumor ablation.  Under consideration for systemic therapy.  HISTORY OF PRESENTING ILLNESS:  [name] is a wonderful 77 y.o. female who is here for continued evaluation and management of Urothelial carcinoma. Patient has been transferred to [place] by Heather. [name]. Patient was diagnosed with Ureteral cancer in September 2020 and was found to have high-grade urothelial carcinoma without any clear-cut muscle invasion. Her current treatment includes intermittent cystoscopy and tumor ablation.  Patient was last seen by Heather. [name] on 09/13/2022 and she complained of occasional dizziness without syncope or falls.   Patient is accompanied by her husband during this visit. She reports she has been doing fairly well without any new or severe medical concerns  since her last visit with Heather. [name].   She denies fever, chills, night sweats, unexpected weight loss, abdominal pain, chest pain, new lump/bump, back pain, hematuria, abnormal bowel moment, shortness of breath, or leg swelling.   Patient notes she has been eating well overall and her weight has been stable.   Patient denies any PET or CT scans. She denies of any urinary infection.  She continues to follow-up with Heather. [name], but notes that Heather. [name] is retiring soon.   MEDICAL HISTORY:  Past Medical History:  Diagnosis Date   Anemia associated with chronic renal failure    CAD in native artery 10/2019   cardiologist-   [name];   a. 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   urologist--- [name]/  oncologist--- [name];   dx 09/ 2020 high grade urothelial  s/p laser ablation, ;  recurrent s/p BCG instillation,  completed chemo instilation 11/ 2022  and repeat chemo completed 05/ 2023   Chronic combined systolic and diastolic CHF (congestive heart failure) (HCC) 06/2019   followed by cardiology;   dx 09/ 2022 in setting of sepsis, pulm edema;   05/ 2022  ef 40-45% per echo,  recovered per cath 01/ 2021 ef 50%;  laste echo 08/ 2023  ef 60-65%   CKD (chronic kidney disease), stage III (HCC)    Depression    GAD (generalized anxiety disorder)    GERD (gastroesophageal reflux disease)    History of bladder cancer 12/2006   followed by [name]   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  dx 04/ 2008  w/ poor function hydronephrotic  right kidney;   02-06-2007  s/p right nephroureterectomy   History of pulmonary embolism 04/2022   in setting severe sepsis;  small RLL, treated w/ 3 months eliquis   Hyperlipidemia, mixed    Hypertension    Solitary kidney, acquired 02/06/2007   s/p  right nephroureterectomy for cancer    SURGICAL HISTORY: Past Surgical  History:  Procedure Laterality Date   CATARACT EXTRACTION W/ INTRAOCULAR LENS IMPLANT Bilateral 2011   CYSTOSCOPY W/ URETERAL STENT PLACEMENT Left 11/17/2019   Procedure: CYSTOSCOPY WITH STENT REPLACEMENT;  Surgeon: Marcine Matar, MD;  Location: [facility];  Service: Urology;  Laterality: Left;   CYSTOSCOPY W/ URETERAL STENT PLACEMENT Left 03/23/2022   Procedure: CYSTOSCOPY WITH RETROGRADE PYELOGRAM, LEFT URETERAL STENT PLACEMENT;  Surgeon: Marcine Matar, MD;  Location: [facility];  Service: Urology;  Laterality: Left;   CYSTOSCOPY W/ URETERAL STENT PLACEMENT Left 06/09/2022   Procedure: CYSTOSCOPY WITH LEFT STENT EXCHANGE;  Surgeon: Marcine Matar, MD;  Location: [facility];  Service: Urology;  Laterality: Left;   CYSTOSCOPY W/ URETERAL STENT PLACEMENT Left 12/04/2022   Procedure: CYSTOSCOPY WITH STENT REPLACEMENT;  Surgeon: Marcine Matar, MD;  Location: [facility];  Service: Urology;  Laterality: Left;   CYSTOSCOPY W/ URETERAL STENT REMOVAL Left 05/10/2020   Procedure: CYSTOSCOPY WITH STENT REMOVAL;  Surgeon: Marcine Matar, MD;  Location: [facility];  Service: Urology;  Laterality: Left;   CYSTOSCOPY W/ URETERAL STENT REMOVAL Left 05/02/2021   Procedure: CYSTOSCOPY WITH STENT REMOVAL;  Surgeon: Marcine Matar, MD;  Location: [facility];  Service: Urology;  Laterality: Left;   CYSTOSCOPY W/ URETERAL STENT REMOVAL Left 11/16/2022   Procedure: CYSTOSCOPY WITH STENT REMOVAL;  Surgeon: Marcine Matar, MD;  Location: [facility];  Service: Urology;  Laterality: Left;   CYSTOSCOPY WITH RETROGRADE PYELOGRAM, URETEROSCOPY AND STENT PLACEMENT Left 06/27/2019   Procedure: CYSTOSCOPY WITH LEFT RETROGRADE PYELOGRAM, URETEROSCOPY, BIOPSY AND LEFT STENT PLACEMENT;  Surgeon: Bjorn Pippin, MD;  Location: [facility];  Service: Urology;  Laterality: Left;   CYSTOSCOPY WITH RETROGRADE PYELOGRAM,  URETEROSCOPY AND STENT PLACEMENT Left 08/18/2019   Procedure: CYSTOSCOPY, URETEROSCOPY AND STENT EXCHANGE;  Surgeon: Marcine Matar, MD;  Location: [facility];  Service: Urology;  Laterality: Left;  90 MINS   CYSTOSCOPY WITH RETROGRADE PYELOGRAM, URETEROSCOPY AND STENT PLACEMENT Left 11/17/2019   Procedure: CYSTOSCOPY WITH RETROGRADE PYELOGRAM, URETEROSCOPY AND STENT PLACEMENT;  Surgeon: Marcine Matar, MD;  Location: [facility];  Service: Urology;  Laterality: Left;  90 MINS   CYSTOSCOPY WITH RETROGRADE PYELOGRAM, URETEROSCOPY AND STENT PLACEMENT Left 05/10/2020   Procedure: CYSTOSCOPY WITH RETROGRADE PYELOGRAM, URETEROSCOPY AND STENT PLACEMENT WITH URETHRAL DIALATION AND BRUSH BIOPSY;  Surgeon: Marcine Matar, MD;  Location: [facility];  Service: Urology;  Laterality: Left;  1 HR   CYSTOSCOPY WITH RETROGRADE PYELOGRAM, URETEROSCOPY AND STENT PLACEMENT Left 09/16/2020   Procedure: CYSTOSCOPY WITH RETROGRADE PYELOGRAM, URETEROSCOPY ,  LITHOPAXY, LEFT URETERAL BRUSHING, AND STENT REPLACEMENT;  Surgeon: Marcine Matar, MD;  Location: [facility];  Service: Urology;  Laterality: Left;   CYSTOSCOPY WITH RETROGRADE PYELOGRAM, URETEROSCOPY AND STENT PLACEMENT Left 10/24/2021   Procedure: CYSTOSCOPY WITH RETROGRADE PYELOGRAM, URETEROSCOPY, POSSIBLE URETERAL AND RENAL BIOPSIES AND STENT PLACEMENT;  Surgeon: Marcine Matar, MD;  Location: [facility];  Service: Urology;  Laterality: Left;  1 HR   CYSTOSCOPY WITH RETROGRADE PYELOGRAM, URETEROSCOPY AND STENT PLACEMENT Left 03/09/2022   Procedure:  CYSTOSCOPY WITH ANTEGRADE PYELOGRAM,  STENT REMOVAL;  Surgeon: Marcine Matar, MD;  Location: [facility];  Service: Urology;  Laterality: Left;   CYSTOSCOPY WITH RETROGRADE PYELOGRAM, URETEROSCOPY AND STENT PLACEMENT  01/09/2007   @[facility]  by [name];    bx's/ washing  bladder , right ureter   CYSTOSCOPY WITH RETROGRADE  PYELOGRAM, URETEROSCOPY AND STENT PLACEMENT Left 11/16/2022   Procedure: CYSTOSCOPY WITH RETROGRADE PYELOGRAM, URETEROSCOPY AND STENT REPLACEMENT;  Surgeon: Marcine Matar, MD;  Location: [facility];  Service: Urology;  Laterality: Left;  90 MINS   CYSTOSCOPY/URETEROSCOPY/HOLMIUM LASER Left 12/04/2022   Procedure: CYSTOSCOPY/URETEROSCOPY/  HOLMIUM LASER OF RENAL PELVIC LESIONS;  Surgeon: Marcine Matar, MD;  Location: [facility];  Service: Urology;  Laterality: Left;   CYSTOSCOPY/URETEROSCOPY/HOLMIUM LASER/STENT PLACEMENT Left 05/02/2021   Procedure: CYSTOSCOPY/URETEROSCOPY WITH BRUSH BIOPSY/ RETROGRADE PYELOGRAM/ HOLMIUM LASER/STENT REPLACEMENT;  Surgeon: Marcine Matar, MD;  Location: [facility];  Service: Urology;  Laterality: Left;   HOLMIUM LASER APPLICATION Left 09/16/2020   Procedure: HOLMIUM LASER APPLICATION;  Surgeon: Marcine Matar, MD;  Location: [facility];  Service: Urology;  Laterality: Left;   HOLMIUM LASER APPLICATION Left 10/24/2021   Procedure: HOLMIUM LASER APPLICATION OF TUMORS;  Surgeon: Marcine Matar, MD;  Location: [facility];  Service: Urology;  Laterality: Left;   HOLMIUM LASER APPLICATION Left 11/16/2022   Procedure: HOLMIUM LASER OF TUMORS;  Surgeon: Marcine Matar, MD;  Location: [facility];  Service: Urology;  Laterality: Left;   IR NEPHROSTOMY PLACEMENT LEFT  07/06/2021   IR NEPHROSTOMY PLACEMENT LEFT  02/06/2022   NEPHROSTOMY TUBE REMOVAL Left 03/23/2022   Procedure: NEPHROSTOMY TUBE REMOVAL;  Surgeon: Marcine Matar, MD;  Location: [facility];  Service: Urology;  Laterality: Left;   NEPHROURETERECTOMY Right 02/06/2007   @[facility]  by [name];   Laparoscopic   RIGHT/LEFT HEART CATH AND CORONARY ANGIOGRAPHY N/A 11/06/2019   Procedure: RIGHT/LEFT HEART CATH AND CORONARY ANGIOGRAPHY;  Surgeon: Lyn Records, MD;  Location: [facility] INVASIVE  CV LAB;  Service: Cardiovascular;  Laterality: N/A;   THULIUM LASER TURP (TRANSURETHRAL RESECTION OF PROSTATE) Left 08/18/2019   Procedure: THULIUM LASER ABLATION OF URETERAL TUMOR;  Surgeon: Marcine Matar, MD;  Location: [facility];  Service: Urology;  Laterality: Left;   THULIUM LASER TURP (TRANSURETHRAL RESECTION OF PROSTATE) Left 11/17/2019   Procedure: THULIUM LASER of URETERAL CANCER;  Surgeon: Marcine Matar, MD;  Location: [facility];  Service: Urology;  Laterality: Left;   TRANSURETHRAL RESECTION OF BLADDER TUMOR  12/12/2006   @[facility]  by Heather Vonita Moss   VAGINAL HYSTERECTOMY  1980    SOCIAL HISTORY: Social History   Socioeconomic History   Marital status: Married    Spouse name: Not on file   Number of children: Not on file   Years of education: Not on file   Highest education level: Not on file  Occupational History   Not on file  Tobacco Use   Smoking status: Former    Packs/day: 0.50    Years: 38.00    Additional pack years: 0.00    Total pack years: 19.00    Types: Cigarettes    Quit date: 11/26/2003    Years since quitting: 19.2   Smokeless tobacco: Never  Vaping Use   Vaping Use: Never used  Substance and Sexual Activity   Alcohol use: Not Currently   Drug use: Never   Sexual activity: Yes    Birth control/protection: Surgical, Post-menopausal  Other Topics  Concern   Not on file  Social History Narrative   Not on file   Social Determinants of Health   Financial Resource Strain: Not on file  Food Insecurity: No Food Insecurity (11/16/2022)   Hunger Vital Sign    Worried About Running Out of Food in the Last Year: Never true    Ran Out of Food in the Last Year: Never true  Transportation Needs: No Transportation Needs (11/16/2022)   PRAPARE - Administrator, Civil Service (Medical): No    Lack of Transportation (Non-Medical): No  Physical Activity: Not on file  Stress: Not on file  Social Connections: Not on file  Intimate  Partner Violence: Not At Risk (11/16/2022)   Humiliation, Afraid, Rape, and Kick questionnaire    Fear of Current or Ex-Partner: No    Emotionally Abused: No    Physically Abused: No    Sexually Abused: No    FAMILY HISTORY: Family History  Problem Relation Age of Onset   Hypertension Mother    Diabetes Mellitus II Sister    Hypertension Sister    Hypertension Brother     ALLERGIES:  is allergic to erythromycin, lotensin [benazepril hcl], and macrodantin [nitrofurantoin].  MEDICATIONS:  Current Outpatient Medications  Medication Sig Dispense Refill   acetaminophen (TYLENOL) 500 MG tablet Take 500-1,000 mg by mouth every 6 (six) hours as needed for mild pain.     aspirin EC 81 MG tablet Take 1 tablet (81 mg total) by mouth daily. Swallow whole. 90 tablet 3   calcium carbonate (TUMS EX) 750 MG chewable tablet Chew 1 tablet by mouth every morning.     carvedilol (COREG) 6.25 MG tablet TAKE 1 TABLET(6.25 MG) BY MOUTH TWICE DAILY WITH A MEAL 180 tablet 3   cholecalciferol (VITAMIN D3) 25 MCG (1000 UNIT) tablet Take 1,000 Units by mouth every morning.     Ferrous Sulfate (IRON PO) Take 1 tablet by mouth daily.     icosapent Ethyl (VASCEPA) 1 g capsule Take 1 capsule (1 g total) by mouth 2 (two) times daily. 60 capsule 11   isosorbide mononitrate (IMDUR) 30 MG 24 hr tablet TAKE 1/2 TABLET(15 MG) BY MOUTH DAILY 45 tablet 0   ondansetron (ZOFRAN) 8 MG tablet Take 8 mg by mouth 3 (three) times daily as needed for vomiting or nausea.     oxybutynin (DITROPAN) 5 MG tablet Take 5 mg by mouth 2 (two) times daily.     polyethylene glycol (MIRALAX / GLYCOLAX) 17 g packet Take 17 g by mouth daily. (Patient taking differently: Take 17 g by mouth daily as needed (constipation).) 30 each 0   rosuvastatin (CRESTOR) 40 MG tablet TAKE 1 TABLET(40 MG) BY MOUTH DAILY 90 tablet 3   sertraline (ZOLOFT) 50 MG tablet Take 50 mg by mouth every morning.     traZODone (DESYREL) 150 MG tablet Take 150 mg by mouth at  bedtime.     No current facility-administered medications for this visit.    REVIEW OF SYSTEMS:    10 Point review of Systems was done is negative except as noted above.  PHYSICAL EXAMINATION: ECOG PERFORMANCE STATUS: 2 - Symptomatic, <50% confined to bed  . Vitals:   02/12/23 1432  BP: 115/65  Pulse: 67  Resp: 18  Temp: 98 F (36.7 C)  SpO2: 97%   Filed Weights   02/12/23 1432  Weight: 170 lb 11.2 oz (77.4 kg)   .Body mass index is 31.22 kg/m.  GENERAL:alert, in no acute  distress and comfortable SKIN: no acute rashes, no significant lesions EYES: conjunctiva are pink and non-injected, sclera anicteric OROPHARYNX: MMM, no exudates, no oropharyngeal erythema or ulceration NECK: supple, no JVD LYMPH:  no palpable lymphadenopathy in the cervical, axillary or inguinal regions LUNGS: clear to auscultation b/l with normal respiratory effort HEART: regular rate & rhythm ABDOMEN:  normoactive bowel sounds , non tender, not distended. Extremity: no pedal edema PSYCH: alert & oriented x 3 with fluent speech NEURO: no focal motor/sensory deficits  LABORATORY DATA:  I have reviewed the data as listed .    Latest Ref Rng & Units 02/12/2023    3:12 PM 12/04/2022    9:49 AM 11/17/2022    3:43 AM  CBC  WBC 4.0 - 10.5 K/uL 7.5   13.1   Hemoglobin 12.0 - 15.0 g/dL 16.1  09.6  04.5   Hematocrit 36.0 - 46.0 % 39.4  37.0  34.2   Platelets 150 - 400 K/uL 193   236    .    Latest Ref Rng & Units 02/12/2023    3:12 PM 12/04/2022    9:49 AM 11/17/2022    3:43 AM  CMP  Glucose 70 - 99 mg/dL 96  409  811   BUN 8 - 23 mg/dL 37  35  30   Creatinine 0.44 - 1.00 mg/dL 9.14  7.82  9.56   Sodium 135 - 145 mmol/L 140  140  136   Potassium 3.5 - 5.1 mmol/L 3.9  4.5  4.4   Chloride 98 - 111 mmol/L 103  103  101   CO2 22 - 32 mmol/L 29   23   Calcium 8.9 - 10.3 mg/dL 9.6   9.1   Total Protein 6.5 - 8.1 g/dL 7.7     Total Bilirubin 0.3 - 1.2 mg/dL 0.3     Alkaline Phos 38 - 126 U/L 56      AST 15 - 41 U/L 13     ALT 0 - 44 U/L 10       RADIOGRAPHIC STUDIES: I have personally reviewed the radiological images as listed and agreed with the findings in the report. No results found.  ASSESSMENT & PLAN:   77 year old woman with:   1.   Left ureteral cancer diagnosed in September 2020.her tumor appears to be superficial although no documentation of muscle sampling on cytology.     2.  Anemia: Appears to be improving at this time with her symptoms of dizziness and fatigue is improved.   3.  Chronic renal insufficiency: Creatinine clearance is around 20 cc/min likely contributing to her anemia.   PLAN: -reviewed and confirmed with patient and her husband her entire oncologic hx since she has transferred cares to me from [name]. -Discussed the cytology results from 12/04/2022 with the patient and her husband. It showed atypical urothelial cells, but was stable.  -Discussed that future treatment option could be immunotherapy.  -Discussed the need of lab work during this visit and patient agrees.  -Labs today. CBC and CMP stable -reviewed. -ferritin and copper wnl -Patient's next appointment with her urologist, Heather. [name], is on June 5th.  -Continue to follow-up with PCP, Urologist, and Kidney Specialist.   FOLLOW-UP: Labs today RTC with Heather Candise Che with labs in 4 months   . Orders Placed This Encounter  Procedures   CBC with Differential (Cancer Center Only)    Standing Status:   Future    Number of Occurrences:   1  Standing Expiration Date:   02/12/2024   CMP (Cancer Center only)    Standing Status:   Future    Number of Occurrences:   1    Standing Expiration Date:   02/12/2024   Ferritin    Standing Status:   Future    Number of Occurrences:   1    Standing Expiration Date:   02/12/2024   Copper, serum    Standing Status:   Future    Number of Occurrences:   1    Standing Expiration Date:   02/12/2024   Vitamin B12    Standing Status:   Future    Number of  Occurrences:   1    Standing Expiration Date:   02/12/2024   .The total time spent in the appointment was 40 minutes* .  All of the patient's questions were answered with apparent satisfaction. The patient knows to call the clinic with any problems, questions or concerns.   Heather Lora MD MS AAHIVMS [facility] Hematology/Oncology Physician [facility]  .*Total Encounter Time as defined by the Centers for Medicare and Medicaid Services includes, in addition to the face-to-face time of a patient visit (documented in the note above) non-face-to-face time: obtaining and reviewing outside history, ordering and reviewing medications, tests or procedures, care coordination (communications with other health care professionals or caregivers) and documentation in the medical record.   02/12/2023 12:04 PM   I, [name], am acting as a Neurosurgeon for Heather Lora, MD. .I have reviewed the above documentation for accuracy and completeness, and I agree with the above. Heather Maine MD

## 2023-02-13 ENCOUNTER — Telehealth: Payer: Self-pay | Admitting: Hematology

## 2023-02-13 LAB — FERRITIN: Ferritin: 156 ng/mL (ref 11–307)

## 2023-02-13 LAB — COPPER, SERUM: Copper: 104 ug/dL (ref 80–158)

## 2023-02-26 ENCOUNTER — Other Ambulatory Visit: Payer: Self-pay | Admitting: Internal Medicine

## 2023-03-14 DIAGNOSIS — R8279 Other abnormal findings on microbiological examination of urine: Secondary | ICD-10-CM | POA: Diagnosis not present

## 2023-03-14 DIAGNOSIS — C662 Malignant neoplasm of left ureter: Secondary | ICD-10-CM | POA: Diagnosis not present

## 2023-03-14 DIAGNOSIS — C652 Malignant neoplasm of left renal pelvis: Secondary | ICD-10-CM | POA: Diagnosis not present

## 2023-03-14 DIAGNOSIS — Z8551 Personal history of malignant neoplasm of bladder: Secondary | ICD-10-CM | POA: Diagnosis not present

## 2023-04-03 DIAGNOSIS — N1832 Chronic kidney disease, stage 3b: Secondary | ICD-10-CM | POA: Diagnosis not present

## 2023-04-03 DIAGNOSIS — I25118 Atherosclerotic heart disease of native coronary artery with other forms of angina pectoris: Secondary | ICD-10-CM | POA: Diagnosis not present

## 2023-04-03 DIAGNOSIS — R197 Diarrhea, unspecified: Secondary | ICD-10-CM | POA: Diagnosis not present

## 2023-04-03 DIAGNOSIS — C689 Malignant neoplasm of urinary organ, unspecified: Secondary | ICD-10-CM | POA: Diagnosis not present

## 2023-04-16 ENCOUNTER — Other Ambulatory Visit: Payer: Self-pay | Admitting: Family Medicine

## 2023-04-16 DIAGNOSIS — Z1231 Encounter for screening mammogram for malignant neoplasm of breast: Secondary | ICD-10-CM

## 2023-05-28 ENCOUNTER — Ambulatory Visit: Admission: RE | Admit: 2023-05-28 | Payer: Medicare PPO | Source: Ambulatory Visit

## 2023-05-28 DIAGNOSIS — Z1231 Encounter for screening mammogram for malignant neoplasm of breast: Secondary | ICD-10-CM | POA: Diagnosis not present

## 2023-05-30 DIAGNOSIS — N201 Calculus of ureter: Secondary | ICD-10-CM | POA: Diagnosis not present

## 2023-05-30 DIAGNOSIS — C662 Malignant neoplasm of left ureter: Secondary | ICD-10-CM | POA: Diagnosis not present

## 2023-05-30 DIAGNOSIS — K5730 Diverticulosis of large intestine without perforation or abscess without bleeding: Secondary | ICD-10-CM | POA: Diagnosis not present

## 2023-06-15 ENCOUNTER — Other Ambulatory Visit: Payer: Self-pay

## 2023-06-15 DIAGNOSIS — C689 Malignant neoplasm of urinary organ, unspecified: Secondary | ICD-10-CM

## 2023-06-18 ENCOUNTER — Inpatient Hospital Stay: Payer: Medicare PPO | Attending: Hematology

## 2023-06-18 ENCOUNTER — Inpatient Hospital Stay (HOSPITAL_BASED_OUTPATIENT_CLINIC_OR_DEPARTMENT_OTHER): Payer: Medicare PPO | Admitting: Hematology

## 2023-06-18 VITALS — BP 123/61 | HR 66 | Temp 98.2°F | Resp 17 | Wt 172.8 lb

## 2023-06-18 DIAGNOSIS — Z905 Acquired absence of kidney: Secondary | ICD-10-CM | POA: Insufficient documentation

## 2023-06-18 DIAGNOSIS — D649 Anemia, unspecified: Secondary | ICD-10-CM | POA: Diagnosis not present

## 2023-06-18 DIAGNOSIS — N189 Chronic kidney disease, unspecified: Secondary | ICD-10-CM | POA: Diagnosis not present

## 2023-06-18 DIAGNOSIS — Z87891 Personal history of nicotine dependence: Secondary | ICD-10-CM | POA: Diagnosis not present

## 2023-06-18 DIAGNOSIS — C679 Malignant neoplasm of bladder, unspecified: Secondary | ICD-10-CM | POA: Diagnosis not present

## 2023-06-18 DIAGNOSIS — C689 Malignant neoplasm of urinary organ, unspecified: Secondary | ICD-10-CM

## 2023-06-18 DIAGNOSIS — Z79899 Other long term (current) drug therapy: Secondary | ICD-10-CM | POA: Diagnosis not present

## 2023-06-18 DIAGNOSIS — R197 Diarrhea, unspecified: Secondary | ICD-10-CM | POA: Diagnosis not present

## 2023-06-18 DIAGNOSIS — R8271 Bacteriuria: Secondary | ICD-10-CM | POA: Diagnosis not present

## 2023-06-18 DIAGNOSIS — C652 Malignant neoplasm of left renal pelvis: Secondary | ICD-10-CM | POA: Diagnosis not present

## 2023-06-18 LAB — CBC WITH DIFFERENTIAL (CANCER CENTER ONLY)
Abs Immature Granulocytes: 0.02 10*3/uL (ref 0.00–0.07)
Basophils Absolute: 0.1 10*3/uL (ref 0.0–0.1)
Basophils Relative: 1 %
Eosinophils Absolute: 0.4 10*3/uL (ref 0.0–0.5)
Eosinophils Relative: 6 %
HCT: 34.6 % — ABNORMAL LOW (ref 36.0–46.0)
Hemoglobin: 11.4 g/dL — ABNORMAL LOW (ref 12.0–15.0)
Immature Granulocytes: 0 %
Lymphocytes Relative: 27 %
Lymphs Abs: 2 10*3/uL (ref 0.7–4.0)
MCH: 30.2 pg (ref 26.0–34.0)
MCHC: 32.9 g/dL (ref 30.0–36.0)
MCV: 91.5 fL (ref 80.0–100.0)
Monocytes Absolute: 0.6 10*3/uL (ref 0.1–1.0)
Monocytes Relative: 7 %
Neutro Abs: 4.6 10*3/uL (ref 1.7–7.7)
Neutrophils Relative %: 59 %
Platelet Count: 183 10*3/uL (ref 150–400)
RBC: 3.78 MIL/uL — ABNORMAL LOW (ref 3.87–5.11)
RDW: 14.8 % (ref 11.5–15.5)
WBC Count: 7.7 10*3/uL (ref 4.0–10.5)
nRBC: 0 % (ref 0.0–0.2)

## 2023-06-18 LAB — CMP (CANCER CENTER ONLY)
ALT: 6 U/L (ref 0–44)
AST: 10 U/L — ABNORMAL LOW (ref 15–41)
Albumin: 3.7 g/dL (ref 3.5–5.0)
Alkaline Phosphatase: 55 U/L (ref 38–126)
Anion gap: 6 (ref 5–15)
BUN: 29 mg/dL — ABNORMAL HIGH (ref 8–23)
CO2: 29 mmol/L (ref 22–32)
Calcium: 9.3 mg/dL (ref 8.9–10.3)
Chloride: 104 mmol/L (ref 98–111)
Creatinine: 2.6 mg/dL — ABNORMAL HIGH (ref 0.44–1.00)
GFR, Estimated: 19 mL/min — ABNORMAL LOW (ref >60)
Glucose, Bld: 115 mg/dL — ABNORMAL HIGH (ref 70–99)
Potassium: 3.8 mmol/L (ref 3.5–5.1)
Sodium: 139 mmol/L (ref 135–145)
Total Bilirubin: 0.3 mg/dL (ref 0.3–1.2)
Total Protein: 7.6 g/dL (ref 6.5–8.1)

## 2023-06-18 LAB — FERRITIN: Ferritin: 224 ng/mL (ref 11–307)

## 2023-06-18 NOTE — Progress Notes (Signed)
HEMATOLOGY/ONCOLOGY CLINIC NOTE  Date of Service: 06/18/2023  Patient Care Team: Shon Hale, MD as PCP - General (Family Medicine) Christell Constant, MD as PCP - Cardiology (Cardiology) Johney Maine, MD as Consulting Physician (Hematology)  CHIEF COMPLAINTS/PURPOSE OF CONSULTATION:  Urothelial carcinoma    Secondary diagnoses: high-grade urothelial carcinoma of the right distal ureter in 2008.   Prior Therapy:   She is status post right nephroureterectomy for a distal ureteral carcinoma in 2008   She is status post cystoscopy and a stent placement in September 2020 and repeated in February 2021 for a left ureteral tumor.  The cytology showed high-grade urothelial carcinoma.   She is status post BCG treatment in 2021.  Subsequent cystoscopy in 2022 showed atypia without obvious urothelial carcinoma.  In July 2022 cytology showed high-grade urothelial carcinoma.   She is status post Jelmyto instillation under the care of Dr. Retta Diones in September 2022 was completed in November 2022.  Following cystoscopy in January 2023 did show some residual tumor by cytology.   In May 2023 she completed repeat Jelmyto infusion.      Current therapy: Intermittent cystoscopy and tumor ablation.  Under consideration for systemic therapy.  HISTORY OF PRESENTING ILLNESS:  [name] is a wonderful 77 y.o. female who is here for continued evaluation and management of Urothelial carcinoma. Patient has been transferred to [place] by Dr. Clelia Croft. Patient was diagnosed with Ureteral cancer in September 2020 and was found to have high-grade urothelial carcinoma without any clear-cut muscle invasion. Her current treatment includes intermittent cystoscopy and tumor ablation.  Patient was last seen by Dr. Clelia Croft on 09/13/2022 and she complained of occasional dizziness without syncope or falls.   Patient is accompanied by her husband during this visit. She reports she has been doing  fairly well without any new or severe medical concerns since her last visit with Dr. Clelia Croft.   She denies fever, chills, night sweats, unexpected weight loss, abdominal pain, chest pain, new lump/bump, back pain, hematuria, abnormal bowel moment, shortness of breath, or leg swelling.   Patient notes she has been eating well overall and her weight has been stable.   Patient denies any PET or CT scans. She denies of any urinary infection.  She continues to follow-up with Dr. Retta Diones, but notes that Dr. Retta Diones is retiring soon.  INTERVAL HISTORY: [name] is a wonderful 77 y.o. female who is here for continued evaluation and management of Urothelial carcinoma.   Patient was last seen by me on 02/12/2023 and she was doing well overall.   Patient notes she has been doing fairly well since our last visit. She complains of occasional intermittent urinary bladder pressure. She has been following up with her Urologist and had an CT abdominal scan, which did not show any abnormalities. Her Urologist has scheduled the patient for new stent.   Patient also complains of diarrhea, which is a little worse during this visit. She has an appointment scheduled with her Gastroenterologist on September 30.    She denies any new infection issues, fever ,chills, night sweats, unexpected weight loss, abdominal pain, chest pain, back pain, or leg swelling.  MEDICAL HISTORY:  Past Medical History:  Diagnosis Date   Anemia associated with chronic renal failure    CAD in native artery 10/2019   cardiologist-   dr Judie Petit. Iz[facility] Ribas;   a. cath 11/06/2019-- nonobstructive moderate CAD especially D1 diffuse 70%  >> medical therapy   Cancer of left renal  pelvis and ureter (HCC) 06/2019   urologist--- dr dahlstedt/  oncologist--- dr Clelia Croft;   dx 09/ 2020 high grade urothelial  s/p laser ablation, ;  recurrent s/p BCG instillation,  completed chemo instilation 11/ 2022  and repeat chemo completed 05/ 2023    Chronic combined systolic and diastolic CHF (congestive heart failure) (HCC) 06/2019   followed by cardiology;   dx 09/ 2022 in setting of sepsis, pulm edema;   05/ 2022  ef 40-45% per echo,  recovered per cath 01/ 2021 ef 50%;  laste echo 08/ 2023  ef 60-65%   CKD (chronic kidney disease), stage III (HCC)    Depression    GAD (generalized anxiety disorder)    GERD (gastroesophageal reflux disease)    History of bladder cancer 12/2006   followed by dr Retta Diones   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   dx 04/ 2008  w/ poor function hydronephrotic  right kidney;   02-06-2007  s/p right nephroureterectomy   History of pulmonary embolism 04/2022   in setting severe sepsis;  small RLL, treated w/ 3 months eliquis   Hyperlipidemia, mixed    Hypertension    Solitary kidney, acquired 02/06/2007   s/p  right nephroureterectomy for cancer    SURGICAL HISTORY: Past Surgical History:  Procedure Laterality Date   CATARACT EXTRACTION W/ INTRAOCULAR LENS IMPLANT Bilateral 2011   CYSTOSCOPY W/ URETERAL STENT PLACEMENT Left 11/17/2019   Procedure: CYSTOSCOPY WITH STENT REPLACEMENT;  Surgeon: Marcine Matar, MD;  Location: [facility];  Service: Urology;  Laterality: Left;   CYSTOSCOPY W/ URETERAL STENT PLACEMENT Left 03/23/2022   Procedure: CYSTOSCOPY WITH RETROGRADE PYELOGRAM, LEFT URETERAL STENT PLACEMENT;  Surgeon: Marcine Matar, MD;  Location: [facility];  Service: Urology;  Laterality: Left;   CYSTOSCOPY W/ URETERAL STENT PLACEMENT Left 06/09/2022   Procedure: CYSTOSCOPY WITH LEFT STENT EXCHANGE;  Surgeon: Marcine Matar, MD;  Location: [facility];  Service: Urology;  Laterality: Left;   CYSTOSCOPY W/ URETERAL STENT PLACEMENT Left 12/04/2022   Procedure: CYSTOSCOPY WITH STENT REPLACEMENT;  Surgeon: Marcine Matar, MD;  Location: [facility];  Service: Urology;  Laterality: Left;   CYSTOSCOPY W/ URETERAL STENT REMOVAL Left 05/10/2020    Procedure: CYSTOSCOPY WITH STENT REMOVAL;  Surgeon: Marcine Matar, MD;  Location: [facility];  Service: Urology;  Laterality: Left;   CYSTOSCOPY W/ URETERAL STENT REMOVAL Left 05/02/2021   Procedure: CYSTOSCOPY WITH STENT REMOVAL;  Surgeon: Marcine Matar, MD;  Location: [facility];  Service: Urology;  Laterality: Left;   CYSTOSCOPY W/ URETERAL STENT REMOVAL Left 11/16/2022   Procedure: CYSTOSCOPY WITH STENT REMOVAL;  Surgeon: Marcine Matar, MD;  Location: [facility];  Service: Urology;  Laterality: Left;   CYSTOSCOPY WITH RETROGRADE PYELOGRAM, URETEROSCOPY AND STENT PLACEMENT Left 06/27/2019   Procedure: CYSTOSCOPY WITH LEFT RETROGRADE PYELOGRAM, URETEROSCOPY, BIOPSY AND LEFT STENT PLACEMENT;  Surgeon: Bjorn Pippin, MD;  Location: [facility];  Service: Urology;  Laterality: Left;   CYSTOSCOPY WITH RETROGRADE PYELOGRAM, URETEROSCOPY AND STENT PLACEMENT Left 08/18/2019   Procedure: CYSTOSCOPY, URETEROSCOPY AND STENT EXCHANGE;  Surgeon: Marcine Matar, MD;  Location: [facility];  Service: Urology;  Laterality: Left;  90 MINS   CYSTOSCOPY WITH RETROGRADE PYELOGRAM, URETEROSCOPY AND STENT PLACEMENT Left 11/17/2019   Procedure: CYSTOSCOPY WITH RETROGRADE PYELOGRAM, URETEROSCOPY AND STENT PLACEMENT;  Surgeon: Marcine Matar, MD;  Location: [facility];  Service: Urology;  Laterality: Left;  90 MINS   CYSTOSCOPY WITH RETROGRADE PYELOGRAM, URETEROSCOPY AND  STENT PLACEMENT Left 05/10/2020   Procedure: CYSTOSCOPY WITH RETROGRADE PYELOGRAM, URETEROSCOPY AND STENT PLACEMENT WITH URETHRAL DIALATION AND BRUSH BIOPSY;  Surgeon: Marcine Matar, MD;  Location: [facility];  Service: Urology;  Laterality: Left;  1 HR   CYSTOSCOPY WITH RETROGRADE PYELOGRAM, URETEROSCOPY AND STENT PLACEMENT Left 09/16/2020   Procedure: CYSTOSCOPY WITH RETROGRADE PYELOGRAM, URETEROSCOPY ,  LITHOPAXY, LEFT URETERAL BRUSHING, AND STENT REPLACEMENT;   Surgeon: Marcine Matar, MD;  Location: [facility];  Service: Urology;  Laterality: Left;   CYSTOSCOPY WITH RETROGRADE PYELOGRAM, URETEROSCOPY AND STENT PLACEMENT Left 10/24/2021   Procedure: CYSTOSCOPY WITH RETROGRADE PYELOGRAM, URETEROSCOPY, POSSIBLE URETERAL AND RENAL BIOPSIES AND STENT PLACEMENT;  Surgeon: Marcine Matar, MD;  Location: [facility];  Service: Urology;  Laterality: Left;  1 HR   CYSTOSCOPY WITH RETROGRADE PYELOGRAM, URETEROSCOPY AND STENT PLACEMENT Left 03/09/2022   Procedure: CYSTOSCOPY WITH ANTEGRADE PYELOGRAM,  STENT REMOVAL;  Surgeon: Marcine Matar, MD;  Location: [facility];  Service: Urology;  Laterality: Left;   CYSTOSCOPY WITH RETROGRADE PYELOGRAM, URETEROSCOPY AND STENT PLACEMENT  01/09/2007   @[facility]  by dr Retta Diones;    bx's/ washing  bladder , right ureter   CYSTOSCOPY WITH RETROGRADE PYELOGRAM, URETEROSCOPY AND STENT PLACEMENT Left 11/16/2022   Procedure: CYSTOSCOPY WITH RETROGRADE PYELOGRAM, URETEROSCOPY AND STENT REPLACEMENT;  Surgeon: Marcine Matar, MD;  Location: [facility];  Service: Urology;  Laterality: Left;  90 MINS   CYSTOSCOPY/URETEROSCOPY/HOLMIUM LASER Left 12/04/2022   Procedure: CYSTOSCOPY/URETEROSCOPY/  HOLMIUM LASER OF RENAL PELVIC LESIONS;  Surgeon: Marcine Matar, MD;  Location: [facility];  Service: Urology;  Laterality: Left;   CYSTOSCOPY/URETEROSCOPY/HOLMIUM LASER/STENT PLACEMENT Left 05/02/2021   Procedure: CYSTOSCOPY/URETEROSCOPY WITH BRUSH BIOPSY/ RETROGRADE PYELOGRAM/ HOLMIUM LASER/STENT REPLACEMENT;  Surgeon: Marcine Matar, MD;  Location: [facility];  Service: Urology;  Laterality: Left;   HOLMIUM LASER APPLICATION Left 09/16/2020   Procedure: HOLMIUM LASER APPLICATION;  Surgeon: Marcine Matar, MD;  Location: [facility];  Service: Urology;  Laterality: Left;   HOLMIUM LASER APPLICATION Left 10/24/2021    Procedure: HOLMIUM LASER APPLICATION OF TUMORS;  Surgeon: Marcine Matar, MD;  Location: [facility];  Service: Urology;  Laterality: Left;   HOLMIUM LASER APPLICATION Left 11/16/2022   Procedure: HOLMIUM LASER OF TUMORS;  Surgeon: Marcine Matar, MD;  Location: [facility];  Service: Urology;  Laterality: Left;   IR NEPHROSTOMY PLACEMENT LEFT  07/06/2021   IR NEPHROSTOMY PLACEMENT LEFT  02/06/2022   NEPHROSTOMY TUBE REMOVAL Left 03/23/2022   Procedure: NEPHROSTOMY TUBE REMOVAL;  Surgeon: Marcine Matar, MD;  Location: [facility];  Service: Urology;  Laterality: Left;   NEPHROURETERECTOMY Right 02/06/2007   @[facility]  by dr Retta Diones;   Laparoscopic   RIGHT/LEFT HEART CATH AND CORONARY ANGIOGRAPHY N/A 11/06/2019   Procedure: RIGHT/LEFT HEART CATH AND CORONARY ANGIOGRAPHY;  Surgeon: Lyn Records, MD;  Location: [facility];  Service: Cardiovascular;  Laterality: N/A;   THULIUM LASER TURP (TRANSURETHRAL RESECTION OF PROSTATE) Left 08/18/2019   Procedure: THULIUM LASER ABLATION OF URETERAL TUMOR;  Surgeon: Marcine Matar, MD;  Location: [facility];  Service: Urology;  Laterality: Left;   THULIUM LASER TURP (TRANSURETHRAL RESECTION OF PROSTATE) Left 11/17/2019   Procedure: THULIUM LASER of URETERAL CANCER;  Surgeon: Marcine Matar, MD;  Location: [facility];  Service: Urology;  Laterality: Left;   TRANSURETHRAL RESECTION OF BLADDER TUMOR  12/12/2006   @[facility]  by dr Vonita Moss   VAGINAL HYSTERECTOMY  [phone]  SOCIAL HISTORY: Social History   Socioeconomic History   Marital status: Married    Spouse name: Not on file   Number of children: Not on file   Years of education: Not on file   Highest education level: Not on file  Occupational History   Not on file  Tobacco Use   Smoking status: Former    Current packs/day: 0.00    Average packs/day: 0.5 packs/day for 38.0 years (19.0 ttl pk-yrs)    Types: Cigarettes     Start date: 11/25/1965    Quit date: 11/26/2003    Years since quitting: 19.5   Smokeless tobacco: Never  Vaping Use   Vaping status: Never Used  Substance and Sexual Activity   Alcohol use: Not Currently   Drug use: Never   Sexual activity: Yes    Birth control/protection: Surgical, Post-menopausal  Other Topics Concern   Not on file  Social History Narrative   Not on file   Social Determinants of Health   Financial Resource Strain: Not on file  Food Insecurity: No Food Insecurity (11/16/2022)   Hunger Vital Sign    Worried About Running Out of Food in the Last Year: Never true    Ran Out of Food in the Last Year: Never true  Transportation Needs: No Transportation Needs (11/16/2022)   PRAPARE - Administrator, Civil Service (Medical): No    Lack of Transportation (Non-Medical): No  Physical Activity: Not on file  Stress: Not on file  Social Connections: Not on file  Intimate Partner Violence: Not At Risk (11/16/2022)   Humiliation, Afraid, Rape, and Kick questionnaire    Fear of Current or Ex-Partner: No    Emotionally Abused: No    Physically Abused: No    Sexually Abused: No    FAMILY HISTORY: Family History  Problem Relation Age of Onset   Hypertension Mother    Diabetes Mellitus II Sister    Hypertension Sister    Hypertension Brother     ALLERGIES:  is allergic to erythromycin, lotensin [benazepril hcl], and macrodantin [nitrofurantoin].  MEDICATIONS:  Current Outpatient Medications  Medication Sig Dispense Refill   acetaminophen (TYLENOL) 500 MG tablet Take 500-1,000 mg by mouth every 6 (six) hours as needed for mild pain.     aspirin EC 81 MG tablet Take 1 tablet (81 mg total) by mouth daily. Swallow whole. 90 tablet 3   calcium carbonate (TUMS EX) 750 MG chewable tablet Chew 1 tablet by mouth every morning.     carvedilol (COREG) 6.25 MG tablet TAKE 1 TABLET(6.25 MG) BY MOUTH TWICE DAILY WITH A MEAL 180 tablet 3   cholecalciferol (VITAMIN D3) 25  MCG (1000 UNIT) tablet Take 1,000 Units by mouth every morning.     Ferrous Sulfate (IRON PO) Take 1 tablet by mouth daily.     icosapent Ethyl (VASCEPA) 1 g capsule Take 1 capsule (1 g total) by mouth 2 (two) times daily. 60 capsule 11   isosorbide mononitrate (IMDUR) 30 MG 24 hr tablet TAKE 1/2 TABLET(15 MG) BY MOUTH DAILY 45 tablet 3   ondansetron (ZOFRAN) 8 MG tablet Take 8 mg by mouth 3 (three) times daily as needed for vomiting or nausea. (Patient not taking: Reported on 02/12/2023)     oxybutynin (DITROPAN) 5 MG tablet Take 5 mg by mouth 2 (two) times daily.     polyethylene glycol (MIRALAX / GLYCOLAX) 17 g packet Take 17 g by mouth daily. (Patient taking differently: Take 17 g by  mouth daily as needed (constipation).) 30 each 0   rosuvastatin (CRESTOR) 40 MG tablet TAKE 1 TABLET(40 MG) BY MOUTH DAILY 90 tablet 3   sertraline (ZOLOFT) 50 MG tablet Take 50 mg by mouth every morning.     traZODone (DESYREL) 150 MG tablet Take 150 mg by mouth at bedtime.     No current facility-administered medications for this visit.    REVIEW OF SYSTEMS:    10 Point review of Systems was done is negative except as noted above.  PHYSICAL EXAMINATION: ECOG PERFORMANCE STATUS: 2 - Symptomatic, <50% confined to bed  . There were no vitals filed for this visit.  There were no vitals filed for this visit.  .There is no height or weight on file to calculate BMI.  GENERAL:alert, in no acute distress and comfortable SKIN: no acute rashes, no significant lesions EYES: conjunctiva are pink and non-injected, sclera anicteric OROPHARYNX: MMM, no exudates, no oropharyngeal erythema or ulceration NECK: supple, no JVD LYMPH:  no palpable lymphadenopathy in the cervical, axillary or inguinal regions LUNGS: clear to auscultation b/l with normal respiratory effort HEART: regular rate & rhythm ABDOMEN:  normoactive bowel sounds , non tender, not distended. Extremity: no pedal edema PSYCH: alert & oriented x 3  with fluent speech NEURO: no focal motor/sensory deficits  LABORATORY DATA:  I have reviewed the data as listed .    Latest Ref Rng & Units 02/12/2023    3:12 PM 12/04/2022    9:49 AM 11/17/2022    3:43 AM  CBC  WBC 4.0 - 10.5 K/uL 7.5   13.1   Hemoglobin 12.0 - 15.0 g/dL 40.9  81.1  91.4   Hematocrit 36.0 - 46.0 % 39.4  37.0  34.2   Platelets 150 - 400 K/uL 193   236    .    Latest Ref Rng & Units 02/12/2023    3:12 PM 12/04/2022    9:49 AM 11/17/2022    3:43 AM  CMP  Glucose 70 - 99 mg/dL 96  782  956   BUN 8 - 23 mg/dL 37  35  30   Creatinine 0.44 - 1.00 mg/dL 2.13  0.86  5.78   Sodium 135 - 145 mmol/L 140  140  136   Potassium 3.5 - 5.1 mmol/L 3.9  4.5  4.4   Chloride 98 - 111 mmol/L 103  103  101   CO2 22 - 32 mmol/L 29   23   Calcium 8.9 - 10.3 mg/dL 9.6   9.1   Total Protein 6.5 - 8.1 g/dL 7.7     Total Bilirubin 0.3 - 1.2 mg/dL 0.3     Alkaline Phos 38 - 126 U/L 56     AST 15 - 41 U/L 13     ALT 0 - 44 U/L 10       RADIOGRAPHIC STUDIES: I have personally reviewed the radiological images as listed and agreed with the findings in the report. MM 3D SCREENING MAMMOGRAM BILATERAL BREAST  Result Date: 05/29/2023 CLINICAL DATA:  Screening. EXAM: DIGITAL SCREENING BILATERAL MAMMOGRAM WITH TOMOSYNTHESIS AND CAD TECHNIQUE: Bilateral screening digital craniocaudal and mediolateral oblique mammograms were obtained. Bilateral screening digital breast tomosynthesis was performed. The images were evaluated with computer-aided detection. COMPARISON:  Previous exam(s). ACR Breast Density Category a: The breasts are almost entirely fatty. FINDINGS: There are no findings suspicious for malignancy. IMPRESSION: No mammographic evidence of malignancy. A result letter of this screening mammogram will be mailed directly to the patient.  RECOMMENDATION: Screening mammogram in one year. (Code:SM-B-01Y) BI-RADS CATEGORY  1: Negative. Electronically Signed   By: Elberta Fortis M.D.   On: 05/29/2023 13:03     ASSESSMENT & PLAN:   77 year old woman with:   1.   Left ureteral cancer diagnosed in September 2020.her tumor appears to be superficial although no documentation of muscle sampling on cytology.     2.  Anemia: Appears to be improving at this time with her symptoms of dizziness and fatigue is improved.   3.  Chronic renal insufficiency: Creatinine clearance is around 20 cc/min likely contributing to her anemia.   PLAN: -Discussed lab results from today, 06/18/2023, with the patient. CBC shows slightly decreased hemoglobin of 11.4 g/dL and slightly decreased hematocrit of 34.6%. CMP shows elevated glucose level at 115, elevated BUN of 29, elevated creatinine level of 2.60, and decreased AST level at 10.  -Continue to follow-up with PCP, Gastroenterologist, and Urologist.  FOLLOW-UP: RTC with Dr Candise Che with labs in 6 months   The total time spent in the appointment was *** minutes* .  All of the patient's questions were answered with apparent satisfaction. The patient knows to call the clinic with any problems, questions or concerns.   Wyvonnia Lora MD MS AAHIVMS [facility] Hematology/Oncology Physician [facility]  .*Total Encounter Time as defined by the Centers for Medicare and Medicaid Services includes, in addition to the face-to-face time of a patient visit (documented in the note above) non-face-to-face time: obtaining and reviewing outside history, ordering and reviewing medications, tests or procedures, care coordination (communications with other health care professionals or caregivers) and documentation in the medical record.   I,Param Shah,acting as a Neurosurgeon for Wyvonnia Lora, MD.,have documented all relevant documentation on the behalf of Wyvonnia Lora, MD,as directed by  Wyvonnia Lora, MD while in the presence of Wyvonnia Lora, MD.

## 2023-06-19 LAB — VITAMIN B12: Vitamin B-12: 275 pg/mL (ref 180–914)

## 2023-06-20 LAB — COPPER, SERUM: Copper: 123 ug/dL (ref 80–158)

## 2023-06-22 DIAGNOSIS — I10 Essential (primary) hypertension: Secondary | ICD-10-CM | POA: Diagnosis not present

## 2023-06-22 DIAGNOSIS — C689 Malignant neoplasm of urinary organ, unspecified: Secondary | ICD-10-CM | POA: Diagnosis not present

## 2023-06-22 DIAGNOSIS — Z0000 Encounter for general adult medical examination without abnormal findings: Secondary | ICD-10-CM | POA: Diagnosis not present

## 2023-06-22 DIAGNOSIS — N184 Chronic kidney disease, stage 4 (severe): Secondary | ICD-10-CM | POA: Diagnosis not present

## 2023-06-22 DIAGNOSIS — I700 Atherosclerosis of aorta: Secondary | ICD-10-CM | POA: Diagnosis not present

## 2023-06-22 DIAGNOSIS — Z23 Encounter for immunization: Secondary | ICD-10-CM | POA: Diagnosis not present

## 2023-06-22 DIAGNOSIS — Z905 Acquired absence of kidney: Secondary | ICD-10-CM | POA: Diagnosis not present

## 2023-06-22 DIAGNOSIS — I5022 Chronic systolic (congestive) heart failure: Secondary | ICD-10-CM | POA: Diagnosis not present

## 2023-06-22 DIAGNOSIS — E7800 Pure hypercholesterolemia, unspecified: Secondary | ICD-10-CM | POA: Diagnosis not present

## 2023-06-25 ENCOUNTER — Other Ambulatory Visit: Payer: Self-pay | Admitting: Urology

## 2023-06-28 NOTE — Progress Notes (Addendum)
COVID Vaccine Completed: no  Date of COVID positive in last 90 days: no  PCP - Garth Bigness, MD Cardiologist - Brigid Re, MD Oncologist- Wyvonnia Lora, MD  Chest x-ray - n/a EKG - 06/01/22 Epic Stress Test - 06/30/19 Epic ECHO - 06/01/22 Epic Cardiac Cath - 11/06/19 Epic Pacemaker/ICD device last checked: n/a Spinal Cord Stimulator: n/a  Bowel Prep - no  Sleep Study - negative CPAP -   Fasting Blood Sugar - n/a Checks Blood Sugar _____ times a day  Last dose of GLP1 agonist-  N/A GLP1 instructions:  N/A   Last dose of SGLT-2 inhibitors-  N/A SGLT-2 instructions: N/A   Blood Thinner Instructions:  Time Aspirin Instructions: ASA 81, no instructions per pt Last Dose:  Activity level: Can go up a flight of stairs and perform activities of daily living without stopping and without symptoms of chest pain or shortness of breath.  Anesthesia review: HTN, CAD, HFrEF, PE, amenia, creatinine 2.60, only has 1 kidney   Patient denies shortness of breath, fever, cough and chest pain at PAT appointment  Patient verbalized understanding of instructions that were given to them at the PAT appointment. Patient was also instructed that they will need to review over the PAT instructions again at home before surgery.

## 2023-06-28 NOTE — Patient Instructions (Addendum)
SURGICAL WAITING ROOM VISITATION  Patients having surgery or a procedure may have no more than 2 support people in the waiting area - these visitors may rotate.    Children under the age of 57 must have an adult with them who is not the patient.  Due to an increase in RSV and influenza rates and associated hospitalizations, children ages 57 and under may not visit patients in [facility] hospitals.  If the patient needs to stay at the hospital during part of their recovery, the visitor guidelines for inpatient rooms apply. Pre-op nurse will coordinate an appropriate time for 1 support person to accompany patient in pre-op.  This support person may not rotate.    Please refer to the [facility] website for the visitor guidelines for Inpatients (after your surgery is over and you are in a regular room).    Your procedure is scheduled on: 07/06/23   Report to [facility] Main Entrance    Report to admitting at 11:15 AM   Call this number if you have problems the morning of surgery [phone]   Do not eat food or drink liquids :After Midnight.          If you have questions, please contact your surgeon's office.   FOLLOW BOWEL PREP AND ANY ADDITIONAL PRE OP INSTRUCTIONS YOU RECEIVED FROM YOUR SURGEON'S OFFICE!!!     Oral Hygiene is also important to reduce your risk of infection.                                    Remember - BRUSH YOUR TEETH THE MORNING OF SURGERY WITH YOUR REGULAR TOOTHPASTE  DENTURES WILL BE REMOVED PRIOR TO SURGERY PLEASE DO NOT APPLY "Poly grip" OR ADHESIVES!!!   Stop all vitamins and herbal supplements 7 days before surgery.   Take these medicines the morning of surgery with A SIP OF WATER: Tylenol, Carvedilol, Isosorbide mononitrate, Rosuvastatin, Sertraline                              You may not have any metal on your body including hair pins, jewelry, and body piercing             Do not wear make-up, lotions, powders, perfumes, or  deodorant  Do not wear nail polish including gel and S&S, artificial/acrylic nails, or any other type of covering on natural nails including finger and toenails. If you have artificial nails, gel coating, etc. that needs to be removed by a nail salon please have this removed prior to surgery or surgery may need to be canceled/ delayed if the surgeon/ anesthesia feels like they are unable to be safely monitored.   Do not shave  48 hours prior to surgery.    Do not bring valuables to the hospital. [facility] IS NOT             RESPONSIBLE   FOR VALUABLES.   Contacts, glasses, dentures or bridgework may not be worn into surgery.  DO NOT BRING YOUR HOME MEDICATIONS TO THE HOSPITAL. PHARMACY WILL DISPENSE MEDICATIONS LISTED ON YOUR MEDICATION LIST TO YOU DURING YOUR ADMISSION IN THE HOSPITAL!    Patients discharged on the day of surgery will not be allowed to drive home.  Someone NEEDS to stay with you for the first 24 hours after anesthesia.   Special Instructions: Bring a  copy of your healthcare power of attorney and living will documents the day of surgery if you haven't scanned them before.              Please read over the following fact sheets you were given: IF YOU HAVE QUESTIONS ABOUT YOUR PRE-OP INSTRUCTIONS PLEASE CALL [name]    If you received a COVID test during your pre-op visit  it is requested that you wear a mask when out in public, stay away from anyone that may not be feeling well and notify your surgeon if you develop symptoms. If you test positive for Covid or have been in contact with anyone that has tested positive in the last 10 days please notify you surgeon.     - Preparing for Surgery Before surgery, you can play an important role.  Because skin is not sterile, your skin needs to be as free of germs as possible.  You can reduce the number of germs on your skin by washing with CHG (chlorahexidine gluconate) soap before surgery.  CHG is an  antiseptic cleaner which kills germs and bonds with the skin to continue killing germs even after washing. Please DO NOT use if you have an allergy to CHG or antibacterial soaps.  If your skin becomes reddened/irritated stop using the CHG and inform your nurse when you arrive at Short Stay. Do not shave (including legs and underarms) for at least 48 hours prior to the first CHG shower.  You may shave your face/neck.  Please follow these instructions carefully:  1.  Shower with CHG Soap the night before surgery and the  morning of surgery.  2.  If you choose to wash your hair, wash your hair first as usual with your normal  shampoo.  3.  After you shampoo, rinse your hair and body thoroughly to remove the shampoo.                             4.  Use CHG as you would any other liquid soap.  You can apply chg directly to the skin and wash.  Gently with a scrungie or clean washcloth.  5.  Apply the CHG Soap to your body ONLY FROM THE NECK DOWN.   Do   not use on face/ open                           Wound or open sores. Avoid contact with eyes, ears mouth and   genitals (private parts).                       Wash face,  Genitals (private parts) with your normal soap.             6.  Wash thoroughly, paying special attention to the area where your    surgery  will be performed.  7.  Thoroughly rinse your body with warm water from the neck down.  8.  DO NOT shower/wash with your normal soap after using and rinsing off the CHG Soap.                9.  Pat yourself dry with a clean towel.            10.  Wear clean pajamas.            11.  Place clean sheets on your bed  the night of your first shower and do not  sleep with pets. Day of Surgery : Do not apply any lotions/deodorants the morning of surgery.  Please wear clean clothes to the hospital/surgery center.  FAILURE TO FOLLOW THESE INSTRUCTIONS MAY RESULT IN THE CANCELLATION OF YOUR SURGERY  PATIENT  SIGNATURE_________________________________  NURSE SIGNATURE__________________________________  ________________________________________________________________________

## 2023-07-02 ENCOUNTER — Other Ambulatory Visit: Payer: Self-pay

## 2023-07-02 ENCOUNTER — Encounter (HOSPITAL_COMMUNITY): Payer: Self-pay

## 2023-07-02 ENCOUNTER — Other Ambulatory Visit: Payer: Self-pay | Admitting: Family Medicine

## 2023-07-02 ENCOUNTER — Encounter (HOSPITAL_COMMUNITY)
Admission: RE | Admit: 2023-07-02 | Discharge: 2023-07-02 | Disposition: A | Discharge status: Home / Self Care | Payer: Medicare PPO | Source: Ambulatory Visit | Attending: Urology | Admitting: Urology

## 2023-07-02 DIAGNOSIS — D631 Anemia in chronic kidney disease: Secondary | ICD-10-CM | POA: Insufficient documentation

## 2023-07-02 DIAGNOSIS — I5042 Chronic combined systolic (congestive) and diastolic (congestive) heart failure: Secondary | ICD-10-CM | POA: Diagnosis not present

## 2023-07-02 DIAGNOSIS — Z905 Acquired absence of kidney: Secondary | ICD-10-CM | POA: Insufficient documentation

## 2023-07-02 DIAGNOSIS — Z01818 Encounter for other preprocedural examination: Secondary | ICD-10-CM | POA: Insufficient documentation

## 2023-07-02 DIAGNOSIS — E782 Mixed hyperlipidemia: Secondary | ICD-10-CM | POA: Diagnosis not present

## 2023-07-02 DIAGNOSIS — Z85528 Personal history of other malignant neoplasm of kidney: Secondary | ICD-10-CM | POA: Insufficient documentation

## 2023-07-02 DIAGNOSIS — Z87891 Personal history of nicotine dependence: Secondary | ICD-10-CM | POA: Insufficient documentation

## 2023-07-02 DIAGNOSIS — C689 Malignant neoplasm of urinary organ, unspecified: Secondary | ICD-10-CM | POA: Diagnosis not present

## 2023-07-02 DIAGNOSIS — E2839 Other primary ovarian failure: Secondary | ICD-10-CM

## 2023-07-02 DIAGNOSIS — Z86711 Personal history of pulmonary embolism: Secondary | ICD-10-CM | POA: Insufficient documentation

## 2023-07-02 DIAGNOSIS — Z960 Presence of urogenital implants: Secondary | ICD-10-CM | POA: Insufficient documentation

## 2023-07-02 DIAGNOSIS — N183 Chronic kidney disease, stage 3 unspecified: Secondary | ICD-10-CM | POA: Insufficient documentation

## 2023-07-02 DIAGNOSIS — Z8551 Personal history of malignant neoplasm of bladder: Secondary | ICD-10-CM | POA: Insufficient documentation

## 2023-07-02 DIAGNOSIS — K219 Gastro-esophageal reflux disease without esophagitis: Secondary | ICD-10-CM | POA: Insufficient documentation

## 2023-07-02 DIAGNOSIS — I2510 Atherosclerotic heart disease of native coronary artery without angina pectoris: Secondary | ICD-10-CM | POA: Diagnosis not present

## 2023-07-02 DIAGNOSIS — I130 Hypertensive heart and chronic kidney disease with heart failure and stage 1 through stage 4 chronic kidney disease, or unspecified chronic kidney disease: Secondary | ICD-10-CM | POA: Diagnosis not present

## 2023-07-02 NOTE — Anesthesia Preprocedure Evaluation (Addendum)
Anesthesia Evaluation  Patient identified by MRN, date of birth, ID band Patient awake    Reviewed: Allergy & Precautions, H&P , NPO status , Patient's Chart, lab work & pertinent test results, reviewed documented beta blocker date and time   Airway Mallampati: II  TM Distance: >3 FB Neck ROM: Full    Dental no notable dental hx. (+) Teeth Intact, Dental Advisory Given   Pulmonary former smoker   Pulmonary exam normal breath sounds clear to auscultation       Cardiovascular hypertension, Pt. on medications and Pt. on home beta blockers + CAD and +CHF   Rhythm:Regular Rate:Normal     Neuro/Psych   Anxiety Depression    negative neurological ROS     GI/Hepatic Neg liver ROS,GERD  Medicated,,  Endo/Other  negative endocrine ROS    Renal/GU Renal disease  negative genitourinary   Musculoskeletal   Abdominal   Peds  Hematology  (+) Blood dyscrasia, anemia   Anesthesia Other Findings   Reproductive/Obstetrics negative OB ROS                             Anesthesia Physical Anesthesia Plan  ASA: 3  Anesthesia Plan: General   Post-op Pain Management:    Induction: Intravenous  PONV Risk Score and Plan: 4 or greater and Ondansetron, Dexamethasone and Treatment may vary due to age or medical condition  Airway Management Planned: LMA  Additional Equipment:   Intra-op Plan:   Post-operative Plan: Extubation in OR  Informed Consent: I have reviewed the patients History and Physical, chart, labs and discussed the procedure including the risks, benefits and alternatives for the proposed anesthesia with the patient or authorized representative who has indicated his/her understanding and acceptance.     Dental advisory given  Plan Discussed with: CRNA  Anesthesia Plan Comments: (See PAT note from 9/23 by Sherlie Ban PA-C )        Anesthesia Quick Evaluation

## 2023-07-02 NOTE — Progress Notes (Signed)
Anesthesia Review:   DISCUSSION: [name] is a 77 yo female who presents to PAT prior to CYSTOSCOPY WITH LEFT RETROGRADE PYELOGRAM, URETEROSCOPY, LASER ABLATION OF TUMOR AND STENT EXCHANGE on 07/06/23 with Dr. Berneice Heinrich. PMH of former smoking, HTN, HLD, moderate non-obstructive CAD by Cath in 2021, chronic systolic and diastolic CHF, hx of PE (no current AC), GERD, CKD, anemia of chronic disease, hx of bladder cancer, ureteral cancer, right kidney cancer s/p R nephrectomy.  Patient is followed by Oncology and Urology for hx of urothelial carcinoma. Her current treatment includes intermittent cystoscopy and tumor ablation. Her last cystoscopy was on 12/04/22. No anesthesia complications from this. She was last seen by Oncology on 06/18/23 and everything appears stable. She was advised to f/u in 6 months.  She follows with Cardiology for hx of CAD, CHF, HTN, HLD. Last seen in clinic on 01/09/23. Noted to be doing well. Hydralazine was previously stopped due to orthostatic hypotension. BP controlled on current regimen.   VS: BP 136/70   Pulse 61   Temp 36.6 C (Oral)   Resp 14   Ht 5\' 3"  (1.6 m)   Wt 76.7 kg   SpO2 98%   BMI 29.94 kg/m   PROVIDERS: Shon Hale, MD   LABS: Labs reviewed: Acceptable for surgery. (all labs ordered are listed, but only abnormal results are displayed)  Labs Reviewed - No data to display   IMAGES:   EKG 05/31/23  Sinus rhythm, rate 82 RSR' in V1 or V2, probably normal variant   CV:  Transthoracic Echocardiogram 06/01/22  IMPRESSIONS     1. Left ventricular ejection fraction, by estimation, is 60 to 65%. Left  ventricular ejection fraction by 3D volume is 63 %. The left ventricle has  normal function. The left ventricle has no regional wall motion  abnormalities. Left ventricular diastolic   parameters are consistent with Grade I diastolic dysfunction (impaired  relaxation).   2. Right ventricular systolic function is normal. The right  ventricular  size is normal. There is normal pulmonary artery systolic pressure. The  estimated right ventricular systolic pressure is 35.5 mmHg.   3. The mitral valve is grossly normal. Mild mitral valve regurgitation.   4. The aortic valve is tricuspid. There is mild calcification of the  aortic valve. There is mild thickening of the aortic valve. Aortic valve  regurgitation is not visualized. Aortic valve sclerosis/calcification is  present, without any evidence of  aortic stenosis.   5. There is Severe (Grade IV) plaque involving the ascending aorta.   6. The inferior vena cava is normal in size with <50% respiratory  variability, suggesting right atrial pressure of 8 mmHg.   Comparison(s): Compared to prior TTE on 02/2021, there is no significant  change.       Left/Right Heart Catheterizations: Date: 11/06/19 Results: Normal right heart pressures. The left main is short, and widely patent. Ostial to proximal LAD diffuse 40 to 50% narrowing. A moderate to large diagonal #1 contains 40 to 50% proximal segmental narrowing, mid 75% narrowing after a bifurcation, and more distal 60% narrowing. The circumflex gives 3 obtuse marginal branches. The first moderate-sized obtuse marginal contains ostial eccentric 50 to 60% narrowing. The RCA contains eccentric 30 to 40% mid narrowing. No significant obstruction is noted otherwise. Normal left ventricular systolic function. EF greater than 50%. LVEDP is normal.   RECOMMENDATIONS:   The patient has moderate coronary disease, especially involving the first diagonal. The diagonal is relatively diffusely diseased but could be  treated with PCI although it perhaps a slightly increased risk of complications due to the diffuse nature of disease. There is no disease that would account for prolonged pain chest pain at rest. If symptoms become nitroglycerin responsive consider diagonal PCI if refractory and impacting quality of life. Aggressive risk  factor modification   ECG or NM Stress Testing : Date: 06/30/19 Results: There was no ST segment deviation noted during stress. No T wave inversion was noted during stress. Defect 1: There is a medium defect of mild severity present in the mid anteroseptal, mid inferolateral, apical anterior, apical septal and apical lateral location. This is a low risk study. Nuclear stress EF: 63%. The left ventricular ejection fraction is normal (55-65%). No prior study for comparison.   There is a mild to moderate defect extending from mid anteroseptum to inferolateral wall. It improves with stress, which is not consistent with ischemia. Normal wall motion in this area. Not consistent with single coronary distrubution. However, TID is 1.21, which means balanced ischemia in multiple territories. cannot be excluded  Past Medical History:  Diagnosis Date   Anemia associated with chronic renal failure    CAD in native artery 10/2019   cardiologist-   dr Judie Petit. [facility] Ribas;   a. 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   urologist--- dr dahlstedt/  oncologist--- dr Clelia Croft;   dx 09/ 2020 high grade urothelial  s/p laser ablation, ;  recurrent s/p BCG instillation,  completed chemo instilation 11/ 2022  and repeat chemo completed 05/ 2023   Chronic combined systolic and diastolic CHF (congestive heart failure) (HCC) 06/2019   followed by cardiology;   dx 09/ 2022 in setting of sepsis, pulm edema;   05/ 2022  ef 40-45% per echo,  recovered per cath 01/ 2021 ef 50%;  laste echo 08/ 2023  ef 60-65%   CKD (chronic kidney disease), stage III (HCC)    Depression    GAD (generalized anxiety disorder)    GERD (gastroesophageal reflux disease)    History of bladder cancer 12/2006   followed by dr Retta Diones   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   dx 04/ 2008  w/ poor function hydronephrotic  right kidney;    02-06-2007  s/p right nephroureterectomy   History of pulmonary embolism 04/2022   in setting severe sepsis;  small RLL, treated w/ 3 months eliquis   Hyperlipidemia, mixed    Hypertension    Solitary kidney, acquired 02/06/2007   s/p  right nephroureterectomy for cancer    Past Surgical History:  Procedure Laterality Date   CATARACT EXTRACTION W/ INTRAOCULAR LENS IMPLANT Bilateral 2011   CYSTOSCOPY W/ URETERAL STENT PLACEMENT Left 11/17/2019   Procedure: CYSTOSCOPY WITH STENT REPLACEMENT;  Surgeon: Marcine Matar, MD;  Location: [facility];  Service: Urology;  Laterality: Left;   CYSTOSCOPY W/ URETERAL STENT PLACEMENT Left 03/23/2022   Procedure: CYSTOSCOPY WITH RETROGRADE PYELOGRAM, LEFT URETERAL STENT PLACEMENT;  Surgeon: Marcine Matar, MD;  Location: [facility];  Service: Urology;  Laterality: Left;   CYSTOSCOPY W/ URETERAL STENT PLACEMENT Left 06/09/2022   Procedure: CYSTOSCOPY WITH LEFT STENT EXCHANGE;  Surgeon: Marcine Matar, MD;  Location: [facility];  Service: Urology;  Laterality: Left;   CYSTOSCOPY W/ URETERAL STENT PLACEMENT Left 12/04/2022   Procedure: CYSTOSCOPY WITH STENT REPLACEMENT;  Surgeon: Marcine Matar, MD;  Location: [facility];  Service: Urology;  Laterality: Left;   CYSTOSCOPY W/ URETERAL STENT REMOVAL Left 05/10/2020   Procedure: CYSTOSCOPY WITH STENT REMOVAL;  Surgeon: Marcine Matar, MD;  Location: [facility];  Service: Urology;  Laterality: Left;   CYSTOSCOPY W/ URETERAL STENT REMOVAL Left 05/02/2021   Procedure: CYSTOSCOPY WITH STENT REMOVAL;  Surgeon: Marcine Matar, MD;  Location: [facility];  Service: Urology;  Laterality: Left;   CYSTOSCOPY W/ URETERAL STENT REMOVAL Left 11/16/2022   Procedure: CYSTOSCOPY WITH STENT REMOVAL;  Surgeon: Marcine Matar, MD;  Location: [facility];  Service: Urology;  Laterality: Left;   CYSTOSCOPY WITH  RETROGRADE PYELOGRAM, URETEROSCOPY AND STENT PLACEMENT Left 06/27/2019   Procedure: CYSTOSCOPY WITH LEFT RETROGRADE PYELOGRAM, URETEROSCOPY, BIOPSY AND LEFT STENT PLACEMENT;  Surgeon: Bjorn Pippin, MD;  Location: [facility];  Service: Urology;  Laterality: Left;   CYSTOSCOPY WITH RETROGRADE PYELOGRAM, URETEROSCOPY AND STENT PLACEMENT Left 08/18/2019   Procedure: CYSTOSCOPY, URETEROSCOPY AND STENT EXCHANGE;  Surgeon: Marcine Matar, MD;  Location: [facility];  Service: Urology;  Laterality: Left;  90 MINS   CYSTOSCOPY WITH RETROGRADE PYELOGRAM, URETEROSCOPY AND STENT PLACEMENT Left 11/17/2019   Procedure: CYSTOSCOPY WITH RETROGRADE PYELOGRAM, URETEROSCOPY AND STENT PLACEMENT;  Surgeon: Marcine Matar, MD;  Location: [facility];  Service: Urology;  Laterality: Left;  90 MINS   CYSTOSCOPY WITH RETROGRADE PYELOGRAM, URETEROSCOPY AND STENT PLACEMENT Left 05/10/2020   Procedure: CYSTOSCOPY WITH RETROGRADE PYELOGRAM, URETEROSCOPY AND STENT PLACEMENT WITH URETHRAL DIALATION AND BRUSH BIOPSY;  Surgeon: Marcine Matar, MD;  Location: [facility];  Service: Urology;  Laterality: Left;  1 HR   CYSTOSCOPY WITH RETROGRADE PYELOGRAM, URETEROSCOPY AND STENT PLACEMENT Left 09/16/2020   Procedure: CYSTOSCOPY WITH RETROGRADE PYELOGRAM, URETEROSCOPY ,  LITHOPAXY, LEFT URETERAL BRUSHING, AND STENT REPLACEMENT;  Surgeon: Marcine Matar, MD;  Location: [facility];  Service: Urology;  Laterality: Left;   CYSTOSCOPY WITH RETROGRADE PYELOGRAM, URETEROSCOPY AND STENT PLACEMENT Left 10/24/2021   Procedure: CYSTOSCOPY WITH RETROGRADE PYELOGRAM, URETEROSCOPY, POSSIBLE URETERAL AND RENAL BIOPSIES AND STENT PLACEMENT;  Surgeon: Marcine Matar, MD;  Location: [facility];  Service: Urology;  Laterality: Left;  1 HR   CYSTOSCOPY WITH RETROGRADE PYELOGRAM, URETEROSCOPY AND STENT PLACEMENT Left 03/09/2022   Procedure: CYSTOSCOPY WITH ANTEGRADE PYELOGRAM,  STENT  REMOVAL;  Surgeon: Marcine Matar, MD;  Location: [facility];  Service: Urology;  Laterality: Left;   CYSTOSCOPY WITH RETROGRADE PYELOGRAM, URETEROSCOPY AND STENT PLACEMENT  01/09/2007   @[facility]  by dr Retta Diones;    bx's/ washing  bladder , right ureter   CYSTOSCOPY WITH RETROGRADE PYELOGRAM, URETEROSCOPY AND STENT PLACEMENT Left 11/16/2022   Procedure: CYSTOSCOPY WITH RETROGRADE PYELOGRAM, URETEROSCOPY AND STENT REPLACEMENT;  Surgeon: Marcine Matar, MD;  Location: [facility];  Service: Urology;  Laterality: Left;  90 MINS   CYSTOSCOPY/URETEROSCOPY/HOLMIUM LASER Left 12/04/2022   Procedure: CYSTOSCOPY/URETEROSCOPY/  HOLMIUM LASER OF RENAL PELVIC LESIONS;  Surgeon: Marcine Matar, MD;  Location: [facility];  Service: Urology;  Laterality: Left;   CYSTOSCOPY/URETEROSCOPY/HOLMIUM LASER/STENT PLACEMENT Left 05/02/2021   Procedure: CYSTOSCOPY/URETEROSCOPY WITH BRUSH BIOPSY/ RETROGRADE PYELOGRAM/ HOLMIUM LASER/STENT REPLACEMENT;  Surgeon: Marcine Matar, MD;  Location: [facility];  Service: Urology;  Laterality: Left;   HOLMIUM LASER APPLICATION Left 09/16/2020   Procedure: HOLMIUM LASER APPLICATION;  Surgeon: Marcine Matar, MD;  Location: [facility];  Service: Urology;  Laterality: Left;   HOLMIUM LASER APPLICATION Left 10/24/2021   Procedure: HOLMIUM LASER APPLICATION OF TUMORS;  Surgeon: Marcine Matar, MD;  Location:  [facility];  Service: Urology;  Laterality: Left;   HOLMIUM LASER APPLICATION Left 11/16/2022   Procedure: HOLMIUM LASER OF TUMORS;  Surgeon: Marcine Matar, MD;  Location: [facility];  Service: Urology;  Laterality: Left;   IR NEPHROSTOMY PLACEMENT LEFT  07/06/2021   IR NEPHROSTOMY PLACEMENT LEFT  02/06/2022   NEPHROSTOMY TUBE REMOVAL Left 03/23/2022   Procedure: NEPHROSTOMY TUBE REMOVAL;  Surgeon: Marcine Matar, MD;  Location: [name];  Service: Urology;  Laterality: Left;   NEPHROURETERECTOMY Right 02/06/2007   @[facility]  by dr Retta Diones;   Laparoscopic   RIGHT/LEFT HEART CATH AND CORONARY ANGIOGRAPHY N/A 11/06/2019   Procedure: RIGHT/LEFT HEART CATH AND CORONARY ANGIOGRAPHY;  Surgeon: Lyn Records, MD;  Location: [facility] INVASIVE CV LAB;  Service: Cardiovascular;  Laterality: N/A;   THULIUM LASER TURP (TRANSURETHRAL RESECTION OF PROSTATE) Left 08/18/2019   Procedure: THULIUM LASER ABLATION OF URETERAL TUMOR;  Surgeon: Marcine Matar, MD;  Location: [facility];  Service: Urology;  Laterality: Left;   THULIUM LASER TURP (TRANSURETHRAL RESECTION OF PROSTATE) Left 11/17/2019   Procedure: THULIUM LASER of URETERAL CANCER;  Surgeon: Marcine Matar, MD;  Location: [facility];  Service: Urology;  Laterality: Left;   TRANSURETHRAL RESECTION OF BLADDER TUMOR  12/12/2006   @[facility]  by dr Vonita Moss   VAGINAL HYSTERECTOMY  1980    MEDICATIONS:  acetaminophen (TYLENOL) 500 MG tablet   aspirin EC 81 MG tablet   calcium carbonate (TUMS EX) 750 MG chewable tablet   carvedilol (COREG) 6.25 MG tablet   cholecalciferol (VITAMIN D3) 25 MCG (1000 UNIT) tablet   Ferrous Sulfate (IRON PO)   icosapent Ethyl (VASCEPA) 1 g capsule   isosorbide mononitrate (IMDUR) 30 MG 24 hr tablet   rosuvastatin (CRESTOR) 40 MG tablet   sertraline (ZOLOFT) 50 MG tablet   traZODone (DESYREL) 150 MG tablet   No current facility-administered medications for this encounter.    Marcille Blanco [facility] Surgical Short Stay/Anesthesiology [facility] Phone [phone] 07/02/2023 2:39 PM

## 2023-07-06 ENCOUNTER — Other Ambulatory Visit: Payer: Self-pay

## 2023-07-06 ENCOUNTER — Ambulatory Visit (HOSPITAL_COMMUNITY): Payer: Medicare PPO

## 2023-07-06 ENCOUNTER — Encounter (HOSPITAL_COMMUNITY): Payer: Self-pay | Admitting: Urology

## 2023-07-06 ENCOUNTER — Ambulatory Visit (HOSPITAL_COMMUNITY)
Admission: RE | Admit: 2023-07-06 | Discharge: 2023-07-06 | Disposition: A | Discharge status: Home / Self Care | Payer: Medicare PPO | Source: Ambulatory Visit | Attending: Urology | Admitting: Urology

## 2023-07-06 ENCOUNTER — Encounter (HOSPITAL_COMMUNITY)
Admission: RE | Disposition: A | Discharge status: Home / Self Care | Payer: Self-pay | Source: Ambulatory Visit | Attending: Urology

## 2023-07-06 ENCOUNTER — Ambulatory Visit (HOSPITAL_COMMUNITY): Payer: Medicare PPO | Admitting: Medical

## 2023-07-06 ENCOUNTER — Ambulatory Visit (HOSPITAL_BASED_OUTPATIENT_CLINIC_OR_DEPARTMENT_OTHER): Payer: Medicare PPO | Admitting: Anesthesiology

## 2023-07-06 DIAGNOSIS — D49512 Neoplasm of unspecified behavior of left kidney: Secondary | ICD-10-CM | POA: Diagnosis not present

## 2023-07-06 DIAGNOSIS — I5042 Chronic combined systolic (congestive) and diastolic (congestive) heart failure: Secondary | ICD-10-CM | POA: Insufficient documentation

## 2023-07-06 DIAGNOSIS — C679 Malignant neoplasm of bladder, unspecified: Secondary | ICD-10-CM | POA: Diagnosis not present

## 2023-07-06 DIAGNOSIS — I130 Hypertensive heart and chronic kidney disease with heart failure and stage 1 through stage 4 chronic kidney disease, or unspecified chronic kidney disease: Secondary | ICD-10-CM | POA: Diagnosis not present

## 2023-07-06 DIAGNOSIS — I2510 Atherosclerotic heart disease of native coronary artery without angina pectoris: Secondary | ICD-10-CM

## 2023-07-06 DIAGNOSIS — N183 Chronic kidney disease, stage 3 unspecified: Secondary | ICD-10-CM | POA: Insufficient documentation

## 2023-07-06 DIAGNOSIS — K219 Gastro-esophageal reflux disease without esophagitis: Secondary | ICD-10-CM | POA: Diagnosis not present

## 2023-07-06 DIAGNOSIS — Z87891 Personal history of nicotine dependence: Secondary | ICD-10-CM | POA: Diagnosis not present

## 2023-07-06 DIAGNOSIS — C662 Malignant neoplasm of left ureter: Secondary | ICD-10-CM

## 2023-07-06 DIAGNOSIS — D631 Anemia in chronic kidney disease: Secondary | ICD-10-CM | POA: Diagnosis not present

## 2023-07-06 HISTORY — PX: CYSTOSCOPY WITH RETROGRADE PYELOGRAM, URETEROSCOPY AND STENT PLACEMENT: SHX5789

## 2023-07-06 SURGERY — CYSTOURETEROSCOPY, WITH RETROGRADE PYELOGRAM AND STENT INSERTION
Anesthesia: General | Laterality: Left

## 2023-07-06 MED ORDER — PROPOFOL 10 MG/ML IV BOLUS
INTRAVENOUS | Status: AC
  Filled 2023-07-06: qty 20

## 2023-07-06 MED ORDER — PROPOFOL 10 MG/ML IV BOLUS
INTRAVENOUS | Status: DC | PRN
  Administered 2023-07-06: 130 mg via INTRAVENOUS

## 2023-07-06 MED ORDER — LACTATED RINGERS IV SOLN: INTRAVENOUS | Status: DC

## 2023-07-06 MED ORDER — DEXAMETHASONE SODIUM PHOSPHATE 4 MG/ML IJ SOLN
INTRAMUSCULAR | Status: DC | PRN
  Administered 2023-07-06: 5 mg via INTRAVENOUS

## 2023-07-06 MED ORDER — CHLORHEXIDINE GLUCONATE 0.12 % MT SOLN
15.0000 mL | Freq: Once | OROMUCOSAL | Status: AC
  Administered 2023-07-06: 15 mL via OROMUCOSAL

## 2023-07-06 MED ORDER — ONDANSETRON HCL 4 MG/2ML IJ SOLN
INTRAMUSCULAR | Status: DC | PRN
  Administered 2023-07-06: 4 mg via INTRAVENOUS

## 2023-07-06 MED ORDER — IOHEXOL 300 MG/ML  SOLN
INTRAMUSCULAR | Status: DC | PRN
  Administered 2023-07-06: 5 mL

## 2023-07-06 MED ORDER — FENTANYL CITRATE PF 50 MCG/ML IJ SOSY
PREFILLED_SYRINGE | INTRAMUSCULAR | Status: AC
  Filled 2023-07-06: qty 2

## 2023-07-06 MED ORDER — FENTANYL CITRATE (PF) 100 MCG/2ML IJ SOLN
INTRAMUSCULAR | Status: DC | PRN
  Administered 2023-07-06: 50 ug via INTRAVENOUS

## 2023-07-06 MED ORDER — SENNOSIDES-DOCUSATE SODIUM 8.6-50 MG PO TABS: 1.0000 | ORAL_TABLET | Freq: Two times a day (BID) | ORAL | 0 refills | Status: DC

## 2023-07-06 MED ORDER — ORAL CARE MOUTH RINSE: 15.0000 mL | Freq: Once | OROMUCOSAL | Status: AC

## 2023-07-06 MED ORDER — EPHEDRINE SULFATE (PRESSORS) 50 MG/ML IJ SOLN
INTRAMUSCULAR | Status: DC | PRN
  Administered 2023-07-06: 10 mg via INTRAVENOUS

## 2023-07-06 MED ORDER — SODIUM CHLORIDE 0.9 % IR SOLN
Status: DC | PRN
  Administered 2023-07-06: 3000 mL

## 2023-07-06 MED ORDER — LIDOCAINE HCL (PF) 2 % IJ SOLN
INTRAMUSCULAR | Status: DC | PRN
Start: 2023-07-06 — End: 2023-07-06
  Administered 2023-07-06: 60 mg via INTRADERMAL

## 2023-07-06 MED ORDER — FENTANYL CITRATE (PF) 100 MCG/2ML IJ SOLN
INTRAMUSCULAR | Status: AC
  Filled 2023-07-06: qty 2

## 2023-07-06 MED ORDER — FENTANYL CITRATE PF 50 MCG/ML IJ SOSY
25.0000 ug | PREFILLED_SYRINGE | INTRAMUSCULAR | Status: DC | PRN
  Administered 2023-07-06 (×2): 25 ug via INTRAVENOUS

## 2023-07-06 MED ORDER — GENTAMICIN SULFATE 40 MG/ML IJ SOLN
5.0000 mg/kg | INTRAVENOUS | Status: AC
  Administered 2023-07-06: 380 mg via INTRAVENOUS
  Filled 2023-07-06: qty 9.5

## 2023-07-06 MED ORDER — OXYCODONE HCL 5 MG PO TABS
5.0000 mg | ORAL_TABLET | Freq: Four times a day (QID) | ORAL | 0 refills | Status: DC | PRN
Start: 2023-07-06 — End: 2024-01-16

## 2023-07-06 MED ORDER — KETAMINE HCL 50 MG/5ML IJ SOSY
PREFILLED_SYRINGE | INTRAMUSCULAR | Status: AC
  Filled 2023-07-06: qty 5

## 2023-07-06 MED ORDER — MIDAZOLAM HCL 2 MG/2ML IJ SOLN
INTRAMUSCULAR | Status: AC
  Filled 2023-07-06: qty 2

## 2023-07-06 MED ORDER — EPHEDRINE 5 MG/ML INJ
INTRAVENOUS | Status: AC
  Filled 2023-07-06: qty 5

## 2023-07-06 SURGICAL SUPPLY — 23 items
BAG URO CATCHER STRL LF (MISCELLANEOUS) ×1 IMPLANT
BASKET LASER NITINOL 1.9FR (BASKET) IMPLANT
BSKT STON RTRVL 120 1.9FR (BASKET)
CATH URETL OPEN END 6FR 70 (CATHETERS) ×1 IMPLANT
CLOTH BEACON ORANGE TIMEOUT ST (SAFETY) ×1 IMPLANT
EXTRACTOR STONE 1.7FRX115CM (UROLOGICAL SUPPLIES) IMPLANT
GLOVE SURG LX STRL 7.5 STRW (GLOVE) ×1 IMPLANT
GOWN STRL REUS W/ TWL XL LVL3 (GOWN DISPOSABLE) ×1 IMPLANT
GOWN STRL REUS W/TWL XL LVL3 (GOWN DISPOSABLE) ×1
GUIDEWIRE ANG ZIPWIRE 038X150 (WIRE) ×1 IMPLANT
GUIDEWIRE STR DUAL SENSOR (WIRE) ×1 IMPLANT
KIT TURNOVER KIT A (KITS) IMPLANT
LASER FIB FLEXIVA PULSE ID 365 (Laser) IMPLANT
MANIFOLD NEPTUNE II (INSTRUMENTS) ×1 IMPLANT
PACK CYSTO (CUSTOM PROCEDURE TRAY) ×1 IMPLANT
SHEATH NAVIGATOR HD 11/13X28 (SHEATH) IMPLANT
SHEATH NAVIGATOR HD 11/13X36 (SHEATH) IMPLANT
STENT CONTOUR 7FRX24 (STENTS) IMPLANT
TRACTIP FLEXIVA PULS ID 200XHI (Laser) IMPLANT
TRACTIP FLEXIVA PULSE ID 200 (Laser) ×1
TUBE PU 8FR 16IN ENFIT (TUBING) ×1 IMPLANT
TUBING CONNECTING 10 (TUBING) ×1 IMPLANT
TUBING UROLOGY SET (TUBING) ×1 IMPLANT

## 2023-07-06 NOTE — Brief Op Note (Signed)
07/06/2023  10:43 AM  PATIENT:  Tonia Ghent  77 y.o. female  PRE-OPERATIVE DIAGNOSIS:  LEFT URETERAL CANCER AND CHRONIC STENT  POST-OPERATIVE DIAGNOSIS:  LEFT URETERAL CANCER AND CHRONIC STENT  PROCEDURE:  Procedure(s) with comments: CYSTOSCOPY WITH LEFT RETROGRADE PYELOGRAM, URETEROSCOPY, LASER ABLATION OF TUMOR AND STENT EXCHANGE (Left) - 60 MINUTES NEEDED FOR CASE  SURGEON:  Surgeons and Role:    * Hayat Warbington, Delbert Phenix., MD - Primary  PHYSICIAN ASSISTANT:   ASSISTANTS: none   ANESTHESIA:   general  EBL:  minimal   BLOOD ADMINISTERED:none  DRAINS: none   LOCAL MEDICATIONS USED:  NONE  SPECIMEN:  No Specimen  DISPOSITION OF SPECIMEN:  N/A  COUNTS:  YES  TOURNIQUET:  * No tourniquets in log *  DICTATION: .Other Dictation: Dictation Number [id number]  PLAN OF CARE: Discharge to home after PACU  PATIENT DISPOSITION:  PACU - hemodynamically stable.   Delay start of Pharmacological VTE agent (>24hrs) due to surgical blood loss or risk of bleeding: yes

## 2023-07-06 NOTE — Anesthesia Procedure Notes (Signed)
Procedure Name: LMA Insertion Date/Time: 07/06/2023 10:08 AM  Performed by: Nathen May, CRNAPre-anesthesia Checklist: Patient identified, Emergency Drugs available, Suction available and Patient being monitored Patient Re-evaluated:Patient Re-evaluated prior to induction Oxygen Delivery Method: Circle System Utilized Preoxygenation: Pre-oxygenation with 100% oxygen Induction Type: IV induction Ventilation: Mask ventilation without difficulty LMA: LMA inserted LMA Size: 4.0 Number of attempts: 1 Airway Equipment and Method: Bite block Placement Confirmation: positive ETCO2 Tube secured with: Tape Dental Injury: Teeth and Oropharynx as per pre-operative assessment

## 2023-07-06 NOTE — Op Note (Signed)
NAMELURENA, SLUYTER MEDICAL RECORD NO: [id number] ACCOUNT NO: [id number] DATE OF BIRTH: 09-04-1946 FACILITY: [facility] LOCATION: [facility] PHYSICIAN: Sebastian Ache, MD  Operative Report   DATE OF PROCEDURE: 07/06/2023  PREOPERATIVE DIAGNOSIS:  Solitary left kidney, recurrent urothelial carcinoma.  PROCEDURE PERFORMED:   1.  Cystoscopy with left retrograde pyelogram interpretation. 2.  Left ureteroscopy with laser ablation of tumor. 3.  Left ureteral stent.  ESTIMATED BLOOD LOSS:  Nil.  COMPLICATIONS:  None.  SPECIMEN:  None.  FINDINGS: 1.  Small volume, mostly upper pole and upper infundibulum tumor recurrence mostly papillary some nodular total volume approximately 1.5 cm. 2.  Successful replacement of left ureteral stent, proximal end in renal pelvis and distal end in urinary bladder.  INDICATIONS:  The patient is a pleasant 77 year old lady with very long history of multifocal urothelial carcinoma.  She is status post right nephroureterectomy previously.  She has a solitary left kidney with recurrent disease.  There is multifocal  within her kidney and bladder.  She has been managed with p.r.n. ablation and has had JELMYTO induction x2.  She adamantly refuses curative therapy with nephroureterectomy and cystectomy, which would of question make her dialysis dependent.  She  understands the risk of silent metastatic progression.  She is on a protocol of a q.6 month surveillance stent change and ablation.  She is due for this.  She presents for this today.  Informed consent was obtained and placed in medical record.  PROCEDURE DETAILS:  The patient being identified and procedure being cystoscopy, left stent exchange, left ureteroscopy with tumor ablation was confirmed.  Procedure timeout was performed.  Intravenous antibiotics were administered.  General anesthesia  was induced.  The patient was placed into a low lithotomy position.  Sterile field was created, prepped and draped the  patient's vagina, introitus, and proximal thighs using iodine.  Cystourethroscopy was performed using 21-French rigid cystoscope with  offset lens.  Inspection of urinary bladder revealed no diverticula, calcifications, papillary lesions.  Distal end of left ureteral stent was seen in situ minimally encrusted.  It was grasped by its entirety set aside for discard. It was inspected and  intact. Left ureter was then cannulated with 6-French end-hole catheter, and a left retrograde pyelogram was obtained.  Left retrograde pyelogram demonstrated single left ureter, single system left kidney.  No obvious filling defects or narrowing noted.  A ZIPwire was advanced at level of lower poles set aside as a safety wire.  An 8-French feeding tube placed in the  urinary bladder for pressure release and semi-rigid ureteroscopy was performed of the distal orifice as left ureter alongside a separate sensor working wire.  No mucosal abnormalities were found.  There was evidence of likely sites of prior tumor and  tumor ablation previously with some minimal scarring, but the ureteral caliber was made impatent of the ureteroscope and two wires. No obvious recurrent disease in the distal orifice of the ureter.  The semirigid scope was then exchanged for the short  length ureteral access sheath over the sensory working wire to the level of proximal ureter using continuous fluoroscopic guidance and flexible digital ureteroscopy was performed of the proximal left ureter and systematic inspection of left kidney  including all calyces x3 and the upper pole and upper pole calyx and upper pole infundibular area.  There was some small volume recurrent tumor.  Some areas of sessile, some areas papillary.  Total surface area only approximately 1.5 cm.  This did appear  amenable to laser ablation  and holmium laser energy was used to ablate these foci using settings of 1 joule and 10 Hz such that all obvious intraluminal tumor was ablated  down to the superficial stroma of the collecting system.  Great care was taken to  avoid any medial laser energy and no evidence of renal pelvis perforation was noted.  The remainder of kidney was relatively unremarkable.  We achieved the goals of the ablative portion of procedure today.  Access sheath was removed under direct  continuous vision, there are no significant mucosal abnormalities were found and a new 7 x 24 contour type stent was carefully placed using cystoscopic and fluoroscopic guidance and good proximal and distal planes were noted.  Procedure was then  terminated.  The patient tolerated procedure well, no immediate perioperative complications.  The patient was taken to postanesthesia care in stable condition.  Plan for discharge home.  She will continue her q.6 month protocol, respecting her wishes of  remaining dialysis free.   PUS D: 07/06/2023 10:52:32 am T: 07/06/2023 11:28:00 am  JOB: [id number]/ [id number]

## 2023-07-06 NOTE — Transfer of Care (Signed)
Immediate Anesthesia Transfer of Care Note  Patient: [name]  Procedure(s) Performed: CYSTOSCOPY WITH LEFT RETROGRADE PYELOGRAM, URETEROSCOPY, LASER ABLATION OF TUMOR AND STENT EXCHANGE (Left)  Patient Location: PACU  Anesthesia Type:General  Level of Consciousness: drowsy  Airway & Oxygen Therapy: Patient Spontanous Breathing and Patient connected to face mask oxygen  Post-op Assessment: Report given to RN and Post -op Vital signs reviewed and stable  Post vital signs: Reviewed  Last Vitals:  Vitals Value Taken Time  BP 136/57 07/06/23 1055  Temp    Pulse 63 07/06/23 1056  Resp 10 07/06/23 1056  SpO2 99 % 07/06/23 1056  Vitals shown include unfiled device data.  Last Pain:  Vitals:   07/06/23 0922  TempSrc: Oral      Patients Stated Pain Goal: 4 (07/06/23 0916)  Complications: No notable events documented.

## 2023-07-06 NOTE — H&P (Signed)
[name] is an 77 y.o. female.    Chief Complaint: Pre-OP Cysto and LEFT ureteroscopy / possible tumor ablation / stent exchange  HPI:   1 - Multifocal Possibly Metastatic Urothelial Carcinoma - Long h/o high grade bladder and upper tract cancer since 2008 s/p right neph-U. Left high grade renal pelvis cancer since 2020. BCG induction 2021. Jelmyto via nephrostomy 2022 and 2023 (two separate cycles)   Recnet Course:  11/2022 - Let 7x24 JJ stent exchange by Dahlsted / laser ablation few small ? lesions; 06/2023 CT - non-specific hilarl adenopathy (<2cm, prev PET negative)   2 - Stage 4 Renal Insuficiency / Solitary Kidney - Cr 2s, GFR 20s x many. Solitary left kidney.   PMH sig for CAD (med mgm't follows [facility]), benign hst. LIves independantly with husband. Her PCP is Garth Bigness MD.   Today " [name] " is seen to proceed with endoscopic eval for recurrent multifocal urothelial carcinoma. Most recetn UCX non-clonal. Cr 2.6, Hgb 11.4.   Past Medical History:  Diagnosis Date   Anemia associated with chronic renal failure    CAD in native artery 10/2019   cardiologist-   dr Judie Petit. [name];   a. 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   urologist--- dr dahlstedt/  oncologist--- dr Clelia Croft;   dx 09/ 2020 high grade urothelial  s/p laser ablation, ;  recurrent s/p BCG instillation,  completed chemo instilation 11/ 2022  and repeat chemo completed 05/ 2023   Chronic combined systolic and diastolic CHF (congestive heart failure) (HCC) 06/2019   followed by cardiology;   dx 09/ 2022 in setting of sepsis, pulm edema;   05/ 2022  ef 40-45% per echo,  recovered per cath 01/ 2021 ef 50%;  laste echo 08/ 2023  ef 60-65%   CKD (chronic kidney disease), stage III (HCC)    Depression    GAD (generalized anxiety disorder)    GERD (gastroesophageal reflux disease)    History of  bladder cancer 12/2006   followed by dr Retta Diones   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   dx 04/ 2008  w/ poor function hydronephrotic  right kidney;   02-06-2007  s/p right nephroureterectomy   History of pulmonary embolism 04/2022   in setting severe sepsis;  small RLL, treated w/ 3 months eliquis   Hyperlipidemia, mixed    Hypertension    Solitary kidney, acquired 02/06/2007   s/p  right nephroureterectomy for cancer    Past Surgical History:  Procedure Laterality Date   CATARACT EXTRACTION W/ INTRAOCULAR LENS IMPLANT Bilateral 2011   CYSTOSCOPY W/ URETERAL STENT PLACEMENT Left 11/17/2019   Procedure: CYSTOSCOPY WITH STENT REPLACEMENT;  Surgeon: Marcine Matar, MD;  Location: [facility];  Service: Urology;  Laterality: Left;   CYSTOSCOPY W/ URETERAL STENT PLACEMENT Left 03/23/2022   Procedure: CYSTOSCOPY WITH RETROGRADE PYELOGRAM, LEFT URETERAL STENT PLACEMENT;  Surgeon: Marcine Matar, MD;  Location: [facility];  Service: Urology;  Laterality: Left;   CYSTOSCOPY W/ URETERAL STENT PLACEMENT Left 06/09/2022   Procedure: CYSTOSCOPY WITH LEFT STENT EXCHANGE;  Surgeon: Marcine Matar, MD;  Location: [facility];  Service: Urology;  Laterality: Left;   CYSTOSCOPY W/ URETERAL STENT PLACEMENT Left 12/04/2022   Procedure: CYSTOSCOPY WITH STENT REPLACEMENT;  Surgeon: Marcine Matar, MD;  Location: [facility];  Service: Urology;  Laterality: Left;   CYSTOSCOPY W/ URETERAL  STENT REMOVAL Left 05/10/2020   Procedure: CYSTOSCOPY WITH STENT REMOVAL;  Surgeon: Marcine Matar, MD;  Location: [facility];  Service: Urology;  Laterality: Left;   CYSTOSCOPY W/ URETERAL STENT REMOVAL Left 05/02/2021   Procedure: CYSTOSCOPY WITH STENT REMOVAL;  Surgeon: Marcine Matar, MD;  Location: [facility];  Service: Urology;  Laterality: Left;   CYSTOSCOPY W/ URETERAL STENT REMOVAL Left 11/16/2022   Procedure: CYSTOSCOPY  WITH STENT REMOVAL;  Surgeon: Marcine Matar, MD;  Location: [facility];  Service: Urology;  Laterality: Left;   CYSTOSCOPY WITH RETROGRADE PYELOGRAM, URETEROSCOPY AND STENT PLACEMENT Left 06/27/2019   Procedure: CYSTOSCOPY WITH LEFT RETROGRADE PYELOGRAM, URETEROSCOPY, BIOPSY AND LEFT STENT PLACEMENT;  Surgeon: Bjorn Pippin, MD;  Location: [facility];  Service: Urology;  Laterality: Left;   CYSTOSCOPY WITH RETROGRADE PYELOGRAM, URETEROSCOPY AND STENT PLACEMENT Left 08/18/2019   Procedure: CYSTOSCOPY, URETEROSCOPY AND STENT EXCHANGE;  Surgeon: Marcine Matar, MD;  Location: [facility];  Service: Urology;  Laterality: Left;  90 MINS   CYSTOSCOPY WITH RETROGRADE PYELOGRAM, URETEROSCOPY AND STENT PLACEMENT Left 11/17/2019   Procedure: CYSTOSCOPY WITH RETROGRADE PYELOGRAM, URETEROSCOPY AND STENT PLACEMENT;  Surgeon: Marcine Matar, MD;  Location: [facility];  Service: Urology;  Laterality: Left;  90 MINS   CYSTOSCOPY WITH RETROGRADE PYELOGRAM, URETEROSCOPY AND STENT PLACEMENT Left 05/10/2020   Procedure: CYSTOSCOPY WITH RETROGRADE PYELOGRAM, URETEROSCOPY AND STENT PLACEMENT WITH URETHRAL DIALATION AND BRUSH BIOPSY;  Surgeon: Marcine Matar, MD;  Location: [facility];  Service: Urology;  Laterality: Left;  1 HR   CYSTOSCOPY WITH RETROGRADE PYELOGRAM, URETEROSCOPY AND STENT PLACEMENT Left 09/16/2020   Procedure: CYSTOSCOPY WITH RETROGRADE PYELOGRAM, URETEROSCOPY ,  LITHOPAXY, LEFT URETERAL BRUSHING, AND STENT REPLACEMENT;  Surgeon: Marcine Matar, MD;  Location: [facility];  Service: Urology;  Laterality: Left;   CYSTOSCOPY WITH RETROGRADE PYELOGRAM, URETEROSCOPY AND STENT PLACEMENT Left 10/24/2021   Procedure: CYSTOSCOPY WITH RETROGRADE PYELOGRAM, URETEROSCOPY, POSSIBLE URETERAL AND RENAL BIOPSIES AND STENT PLACEMENT;  Surgeon: Marcine Matar, MD;  Location: [facility];  Service: Urology;  Laterality: Left;  1 HR    CYSTOSCOPY WITH RETROGRADE PYELOGRAM, URETEROSCOPY AND STENT PLACEMENT Left 03/09/2022   Procedure: CYSTOSCOPY WITH ANTEGRADE PYELOGRAM,  STENT REMOVAL;  Surgeon: Marcine Matar, MD;  Location: [facility];  Service: Urology;  Laterality: Left;   CYSTOSCOPY WITH RETROGRADE PYELOGRAM, URETEROSCOPY AND STENT PLACEMENT  01/09/2007   @[facility]  by dr Retta Diones;    bx's/ washing  bladder , right ureter   CYSTOSCOPY WITH RETROGRADE PYELOGRAM, URETEROSCOPY AND STENT PLACEMENT Left 11/16/2022   Procedure: CYSTOSCOPY WITH RETROGRADE PYELOGRAM, URETEROSCOPY AND STENT REPLACEMENT;  Surgeon: Marcine Matar, MD;  Location: [facility];  Service: Urology;  Laterality: Left;  90 MINS   CYSTOSCOPY/URETEROSCOPY/HOLMIUM LASER Left 12/04/2022   Procedure: CYSTOSCOPY/URETEROSCOPY/  HOLMIUM LASER OF RENAL PELVIC LESIONS;  Surgeon: Marcine Matar, MD;  Location: [facility];  Service: Urology;  Laterality: Left;   CYSTOSCOPY/URETEROSCOPY/HOLMIUM LASER/STENT PLACEMENT Left 05/02/2021   Procedure: CYSTOSCOPY/URETEROSCOPY WITH BRUSH BIOPSY/ RETROGRADE PYELOGRAM/ HOLMIUM LASER/STENT REPLACEMENT;  Surgeon: Marcine Matar, MD;  Location: [facility];  Service: Urology;  Laterality: Left;   HOLMIUM LASER APPLICATION Left 09/16/2020   Procedure: HOLMIUM LASER APPLICATION;  Surgeon: Marcine Matar, MD;  Location: [facility];  Service: Urology;  Laterality: Left;   HOLMIUM LASER APPLICATION Left 10/24/2021   Procedure: HOLMIUM LASER APPLICATION OF TUMORS;  Surgeon: Marcine Matar, MD;  Location: [facility];  Service: Urology;  Laterality: Left;   HOLMIUM LASER APPLICATION Left 11/16/2022   Procedure: HOLMIUM LASER OF TUMORS;  Surgeon: Marcine Matar, MD;  Location: [facility];  Service: Urology;  Laterality: Left;   IR NEPHROSTOMY PLACEMENT LEFT  07/06/2021   IR NEPHROSTOMY PLACEMENT LEFT  02/06/2022    NEPHROSTOMY TUBE REMOVAL Left 03/23/2022   Procedure: NEPHROSTOMY TUBE REMOVAL;  Surgeon: Marcine Matar, MD;  Location: [facility];  Service: Urology;  Laterality: Left;   NEPHROURETERECTOMY Right 02/06/2007   @[facility]  by dr Retta Diones;   Laparoscopic   RIGHT/LEFT HEART CATH AND CORONARY ANGIOGRAPHY N/A 11/06/2019   Procedure: RIGHT/LEFT HEART CATH AND CORONARY ANGIOGRAPHY;  Surgeon: Lyn Records, MD;  Location: [facility];  Service: Cardiovascular;  Laterality: N/A;   THULIUM LASER TURP (TRANSURETHRAL RESECTION OF PROSTATE) Left 08/18/2019   Procedure: THULIUM LASER ABLATION OF URETERAL TUMOR;  Surgeon: Marcine Matar, MD;  Location: [facility];  Service: Urology;  Laterality: Left;   THULIUM LASER TURP (TRANSURETHRAL RESECTION OF PROSTATE) Left 11/17/2019   Procedure: THULIUM LASER of URETERAL CANCER;  Surgeon: Marcine Matar, MD;  Location[facility];  Service: Urology;  Laterality: Left;   TRANSURETHRAL RESECTION OF BLADDER TUMOR  12/12/2006   @[facility]  by dr Vonita Moss   VAGINAL HYSTERECTOMY  1980    Family History  Problem Relation Age of Onset   Hypertension Mother    Diabetes Mellitus II Sister    Hypertension Sister    Hypertension Brother    Social History:  reports that she quit smoking about 19 years ago. Her smoking use included cigarettes. She started smoking about 57 years ago. She has a 19 pack-year smoking history. She has never used smokeless tobacco. She reports that she does not currently use alcohol. She reports that she does not use drugs.  Allergies:  Allergies  Allergen Reactions   Erythromycin Diarrhea and Nausea And Vomiting   Lotensin [Benazepril Hcl] Cough   Macrodantin [Nitrofurantoin] Diarrhea    And night sweats    Medications Prior to Admission  Medication Sig Dispense Refill   acetaminophen (TYLENOL) 500 MG tablet Take 500-1,000 mg by mouth every 6 (six) hours as needed for mild pain.     aspirin EC 81 MG  tablet Take 1 tablet (81 mg total) by mouth daily. Swallow whole. 90 tablet 3   calcium carbonate (TUMS EX) 750 MG chewable tablet Chew 1 tablet by mouth every morning.     carvedilol (COREG) 6.25 MG tablet TAKE 1 TABLET(6.25 MG) BY MOUTH TWICE DAILY WITH A MEAL (Patient taking differently: Take 3.125 mg by mouth in the morning and at bedtime.) 180 tablet 3   cholecalciferol (VITAMIN D3) 25 MCG (1000 UNIT) tablet Take 1,000 Units by mouth every morning.     Ferrous Sulfate (IRON PO) Take 1 tablet by mouth in the morning.     icosapent Ethyl (VASCEPA) 1 g capsule Take 1 capsule (1 g total) by mouth 2 (two) times daily. 60 capsule 11   isosorbide mononitrate (IMDUR) 30 MG 24 hr tablet TAKE 1/2 TABLET(15 MG) BY MOUTH DAILY 45 tablet 3   rosuvastatin (CRESTOR) 40 MG tablet TAKE 1 TABLET(40 MG) BY MOUTH DAILY 90 tablet 3   sertraline (ZOLOFT) 50 MG tablet Take 50 mg by mouth every morning.     traZODone (DESYREL) 150 MG tablet Take 150 mg by mouth at bedtime.      No results found for this or any previous visit (from the past 48 hour(s)). No  results found.  Review of Systems  Constitutional:  Negative for chills and fever.  Genitourinary:  Positive for hematuria and urgency.  All other systems reviewed and are negative.   There were no vitals taken for this visit. Physical Exam Vitals reviewed.  HENT:     Head: Normocephalic.     Nose: Nose normal.  Eyes:     Pupils: Pupils are equal, round, and reactive to light.  Cardiovascular:     Rate and Rhythm: Normal rate.  Pulmonary:     Effort: Pulmonary effort is normal.  Abdominal:     General: Abdomen is flat.  Genitourinary:    Comments: No CVAT at present Musculoskeletal:        General: Normal range of motion.     Cervical back: Normal range of motion.  Skin:    General: Skin is warm.  Neurological:     General: No focal deficit present.     Mental Status: She is alert.  Psychiatric:        Mood and Affect: Mood normal.       Assessment/Plan  Proceed as planned with cysto, LEFT retrograde / ureteroscopy / possible ablation / stent exchange. Risks, benefits, alternatives, expected peri-op course discussed previously and reiterated today.   Loletta Parish., MD 07/06/2023, 9:16 AM

## 2023-07-06 NOTE — Discharge Instructions (Signed)
1 - You may have urinary urgency (bladder spasms) and bloody urine on / off with stent in place. This is normal. ° °2 - Call MD or go to ER for fever >102, severe pain / nausea / vomiting not relieved by medications, or acute change in medical status ° °

## 2023-07-06 NOTE — Anesthesia Postprocedure Evaluation (Signed)
Anesthesia Post Note  Patient: [name]  Procedure(s) Performed: CYSTOSCOPY WITH LEFT RETROGRADE PYELOGRAM, URETEROSCOPY, LASER ABLATION OF TUMOR AND STENT EXCHANGE (Left)     Patient location during evaluation: PACU Anesthesia Type: General Level of consciousness: awake and alert Pain management: pain level controlled Vital Signs Assessment: post-procedure vital signs reviewed and stable Respiratory status: spontaneous breathing, nonlabored ventilation and respiratory function stable Cardiovascular status: blood pressure returned to baseline and stable Postop Assessment: no apparent nausea or vomiting Anesthetic complications: no  No notable events documented.  Last Vitals:  Vitals:   07/06/23 1200 07/06/23 1211  BP: 134/70 116/63  Pulse: (!) 54 64  Resp: 10   Temp:  (!) 36.3 C  SpO2: 98% 100%    Last Pain:  Vitals:   07/06/23 1200  TempSrc:   PainSc: 4                  Chevelle Coulson,W. EDMOND

## 2023-07-07 ENCOUNTER — Encounter (HOSPITAL_COMMUNITY): Payer: Self-pay | Admitting: Urology

## 2023-08-01 DIAGNOSIS — R112 Nausea with vomiting, unspecified: Secondary | ICD-10-CM | POA: Diagnosis not present

## 2023-08-01 DIAGNOSIS — R5381 Other malaise: Secondary | ICD-10-CM | POA: Diagnosis not present

## 2023-08-02 ENCOUNTER — Emergency Department (HOSPITAL_COMMUNITY): Payer: Medicare PPO

## 2023-08-02 ENCOUNTER — Encounter (HOSPITAL_COMMUNITY): Payer: Self-pay | Admitting: Emergency Medicine

## 2023-08-02 ENCOUNTER — Other Ambulatory Visit: Payer: Self-pay

## 2023-08-02 ENCOUNTER — Inpatient Hospital Stay (HOSPITAL_COMMUNITY)
Admission: EM | Admit: 2023-08-02 | Discharge: 2023-08-10 | DRG: 683 | Disposition: A | Discharge status: Home / Self Care | Payer: Medicare PPO | Attending: Internal Medicine | Admitting: Internal Medicine

## 2023-08-02 DIAGNOSIS — Z905 Acquired absence of kidney: Secondary | ICD-10-CM

## 2023-08-02 DIAGNOSIS — R5383 Other fatigue: Secondary | ICD-10-CM | POA: Diagnosis not present

## 2023-08-02 DIAGNOSIS — K5730 Diverticulosis of large intestine without perforation or abscess without bleeding: Secondary | ICD-10-CM | POA: Diagnosis not present

## 2023-08-02 DIAGNOSIS — N184 Chronic kidney disease, stage 4 (severe): Secondary | ICD-10-CM

## 2023-08-02 DIAGNOSIS — D649 Anemia, unspecified: Secondary | ICD-10-CM | POA: Diagnosis not present

## 2023-08-02 DIAGNOSIS — N179 Acute kidney failure, unspecified: Principal | ICD-10-CM | POA: Diagnosis present

## 2023-08-02 DIAGNOSIS — I10 Essential (primary) hypertension: Secondary | ICD-10-CM | POA: Diagnosis present

## 2023-08-02 DIAGNOSIS — I5042 Chronic combined systolic (congestive) and diastolic (congestive) heart failure: Secondary | ICD-10-CM | POA: Diagnosis present

## 2023-08-02 DIAGNOSIS — R0602 Shortness of breath: Secondary | ICD-10-CM | POA: Diagnosis not present

## 2023-08-02 DIAGNOSIS — E782 Mixed hyperlipidemia: Secondary | ICD-10-CM | POA: Diagnosis not present

## 2023-08-02 DIAGNOSIS — Z7982 Long term (current) use of aspirin: Secondary | ICD-10-CM

## 2023-08-02 DIAGNOSIS — Z90710 Acquired absence of both cervix and uterus: Secondary | ICD-10-CM | POA: Diagnosis not present

## 2023-08-02 DIAGNOSIS — N390 Urinary tract infection, site not specified: Secondary | ICD-10-CM | POA: Diagnosis present

## 2023-08-02 DIAGNOSIS — I2510 Atherosclerotic heart disease of native coronary artery without angina pectoris: Secondary | ICD-10-CM | POA: Diagnosis not present

## 2023-08-02 DIAGNOSIS — E876 Hypokalemia: Secondary | ICD-10-CM | POA: Diagnosis present

## 2023-08-02 DIAGNOSIS — C689 Malignant neoplasm of urinary organ, unspecified: Secondary | ICD-10-CM | POA: Diagnosis present

## 2023-08-02 DIAGNOSIS — Z86711 Personal history of pulmonary embolism: Secondary | ICD-10-CM

## 2023-08-02 DIAGNOSIS — B3749 Other urogenital candidiasis: Secondary | ICD-10-CM | POA: Diagnosis present

## 2023-08-02 DIAGNOSIS — Z961 Presence of intraocular lens: Secondary | ICD-10-CM | POA: Diagnosis present

## 2023-08-02 DIAGNOSIS — I700 Atherosclerosis of aorta: Secondary | ICD-10-CM | POA: Diagnosis not present

## 2023-08-02 DIAGNOSIS — R5381 Other malaise: Secondary | ICD-10-CM | POA: Diagnosis not present

## 2023-08-02 DIAGNOSIS — E860 Dehydration: Secondary | ICD-10-CM | POA: Diagnosis present

## 2023-08-02 DIAGNOSIS — Z8249 Family history of ischemic heart disease and other diseases of the circulatory system: Secondary | ICD-10-CM

## 2023-08-02 DIAGNOSIS — D631 Anemia in chronic kidney disease: Secondary | ICD-10-CM | POA: Diagnosis not present

## 2023-08-02 DIAGNOSIS — Z8553 Personal history of malignant neoplasm of renal pelvis: Secondary | ICD-10-CM

## 2023-08-02 DIAGNOSIS — Z8551 Personal history of malignant neoplasm of bladder: Secondary | ICD-10-CM

## 2023-08-02 DIAGNOSIS — J40 Bronchitis, not specified as acute or chronic: Secondary | ICD-10-CM | POA: Diagnosis not present

## 2023-08-02 DIAGNOSIS — Z833 Family history of diabetes mellitus: Secondary | ICD-10-CM | POA: Diagnosis not present

## 2023-08-02 DIAGNOSIS — R059 Cough, unspecified: Secondary | ICD-10-CM | POA: Diagnosis not present

## 2023-08-02 DIAGNOSIS — I771 Stricture of artery: Secondary | ICD-10-CM | POA: Diagnosis not present

## 2023-08-02 DIAGNOSIS — K219 Gastro-esophageal reflux disease without esophagitis: Secondary | ICD-10-CM | POA: Diagnosis present

## 2023-08-02 DIAGNOSIS — R319 Hematuria, unspecified: Secondary | ICD-10-CM | POA: Diagnosis not present

## 2023-08-02 DIAGNOSIS — Z79899 Other long term (current) drug therapy: Secondary | ICD-10-CM

## 2023-08-02 DIAGNOSIS — Z8554 Personal history of malignant neoplasm of ureter: Secondary | ICD-10-CM

## 2023-08-02 DIAGNOSIS — K449 Diaphragmatic hernia without obstruction or gangrene: Secondary | ICD-10-CM | POA: Diagnosis not present

## 2023-08-02 DIAGNOSIS — Z86718 Personal history of other venous thrombosis and embolism: Secondary | ICD-10-CM | POA: Diagnosis not present

## 2023-08-02 DIAGNOSIS — E871 Hypo-osmolality and hyponatremia: Secondary | ICD-10-CM | POA: Diagnosis present

## 2023-08-02 DIAGNOSIS — F411 Generalized anxiety disorder: Secondary | ICD-10-CM | POA: Diagnosis present

## 2023-08-02 DIAGNOSIS — I130 Hypertensive heart and chronic kidney disease with heart failure and stage 1 through stage 4 chronic kidney disease, or unspecified chronic kidney disease: Secondary | ICD-10-CM | POA: Diagnosis not present

## 2023-08-02 DIAGNOSIS — E861 Hypovolemia: Secondary | ICD-10-CM | POA: Diagnosis present

## 2023-08-02 DIAGNOSIS — D72829 Elevated white blood cell count, unspecified: Secondary | ICD-10-CM | POA: Diagnosis not present

## 2023-08-02 DIAGNOSIS — Z87891 Personal history of nicotine dependence: Secondary | ICD-10-CM

## 2023-08-02 DIAGNOSIS — R590 Localized enlarged lymph nodes: Secondary | ICD-10-CM | POA: Diagnosis not present

## 2023-08-02 LAB — BASIC METABOLIC PANEL
Anion gap: 8 (ref 5–15)
BUN: 54 mg/dL — ABNORMAL HIGH (ref 8–23)
CO2: 18 mmol/L — ABNORMAL LOW (ref 22–32)
Calcium: 7.9 mg/dL — ABNORMAL LOW (ref 8.9–10.3)
Chloride: 102 mmol/L (ref 98–111)
Creatinine, Ser: 6 mg/dL — ABNORMAL HIGH (ref 0.44–1.00)
GFR, Estimated: 7 mL/min — ABNORMAL LOW (ref >60)
Glucose, Bld: 107 mg/dL — ABNORMAL HIGH (ref 70–99)
Potassium: 3.7 mmol/L (ref 3.5–5.1)
Sodium: 128 mmol/L — ABNORMAL LOW (ref 135–145)

## 2023-08-02 LAB — COMPREHENSIVE METABOLIC PANEL
ALT: 11 U/L (ref 0–44)
AST: 10 U/L — ABNORMAL LOW (ref 15–41)
Albumin: 2.8 g/dL — ABNORMAL LOW (ref 3.5–5.0)
Alkaline Phosphatase: 62 U/L (ref 38–126)
Anion gap: 11 (ref 5–15)
BUN: 56 mg/dL — ABNORMAL HIGH (ref 8–23)
CO2: 19 mmol/L — ABNORMAL LOW (ref 22–32)
Calcium: 8.2 mg/dL — ABNORMAL LOW (ref 8.9–10.3)
Chloride: 101 mmol/L (ref 98–111)
Creatinine, Ser: 6.13 mg/dL — ABNORMAL HIGH (ref 0.44–1.00)
GFR, Estimated: 7 mL/min — ABNORMAL LOW (ref >60)
Glucose, Bld: 113 mg/dL — ABNORMAL HIGH (ref 70–99)
Potassium: 3.1 mmol/L — ABNORMAL LOW (ref 3.5–5.1)
Sodium: 131 mmol/L — ABNORMAL LOW (ref 135–145)
Total Bilirubin: 0.6 mg/dL (ref 0.3–1.2)
Total Protein: 8 g/dL (ref 6.5–8.1)

## 2023-08-02 LAB — CBC WITH DIFFERENTIAL/PLATELET
Abs Immature Granulocytes: 0.08 10*3/uL — ABNORMAL HIGH (ref 0.00–0.07)
Basophils Absolute: 0.1 10*3/uL (ref 0.0–0.1)
Basophils Relative: 0 %
Eosinophils Absolute: 0.2 10*3/uL (ref 0.0–0.5)
Eosinophils Relative: 2 %
HCT: 29.4 % — ABNORMAL LOW (ref 36.0–46.0)
Hemoglobin: 9.2 g/dL — ABNORMAL LOW (ref 12.0–15.0)
Immature Granulocytes: 1 %
Lymphocytes Relative: 14 %
Lymphs Abs: 2 10*3/uL (ref 0.7–4.0)
MCH: 28.7 pg (ref 26.0–34.0)
MCHC: 31.3 g/dL (ref 30.0–36.0)
MCV: 91.6 fL (ref 80.0–100.0)
Monocytes Absolute: 1.4 10*3/uL — ABNORMAL HIGH (ref 0.1–1.0)
Monocytes Relative: 10 %
Neutro Abs: 9.9 10*3/uL — ABNORMAL HIGH (ref 1.7–7.7)
Neutrophils Relative %: 73 %
Platelets: 254 10*3/uL (ref 150–400)
RBC: 3.21 MIL/uL — ABNORMAL LOW (ref 3.87–5.11)
RDW: 14.8 % (ref 11.5–15.5)
WBC: 13.6 10*3/uL — ABNORMAL HIGH (ref 4.0–10.5)
nRBC: 0 % (ref 0.0–0.2)

## 2023-08-02 LAB — URINALYSIS, W/ REFLEX TO CULTURE (INFECTION SUSPECTED)
Bilirubin Urine: NEGATIVE
Glucose, UA: NEGATIVE mg/dL
Ketones, ur: NEGATIVE mg/dL
Nitrite: NEGATIVE
Protein, ur: 30 mg/dL — AB
RBC / HPF: >50 RBC/hpf (ref 0–5)
Specific Gravity, Urine: 1.006 (ref 1.005–1.030)
WBC, UA: >50 WBC/hpf (ref 0–5)
pH: 5 (ref 5.0–8.0)

## 2023-08-02 LAB — BLOOD GAS, VENOUS
Acid-base deficit: 3.9 mmol/L — ABNORMAL HIGH (ref 0.0–2.0)
Bicarbonate: 21.6 mmol/L (ref 20.0–28.0)
O2 Saturation: 24.5 %
Patient temperature: 37.2
pCO2, Ven: 40 mm[Hg] — ABNORMAL LOW (ref 44–60)
pH, Ven: 7.34 (ref 7.25–7.43)
pO2, Ven: <31 mm[Hg] — CL (ref 32–45)

## 2023-08-02 LAB — CREATININE, URINE, RANDOM: Creatinine, Urine: 60 mg/dL

## 2023-08-02 LAB — SODIUM, URINE, RANDOM: Sodium, Ur: 43 mmol/L

## 2023-08-02 LAB — PROTIME-INR
INR: 1.3 — ABNORMAL HIGH (ref 0.8–1.2)
Prothrombin Time: 16.4 s — ABNORMAL HIGH (ref 11.4–15.2)

## 2023-08-02 LAB — POC OCCULT BLOOD, ED: Fecal Occult Bld: NEGATIVE

## 2023-08-02 LAB — OSMOLALITY, URINE: Osmolality, Ur: 224 mosm/kg — ABNORMAL LOW (ref 300–900)

## 2023-08-02 MED ORDER — ROSUVASTATIN CALCIUM 20 MG PO TABS
40.0000 mg | ORAL_TABLET | Freq: Every day | ORAL | Status: DC
Start: 2023-08-03 — End: 2023-08-10
  Administered 2023-08-03 – 2023-08-10 (×8): 40 mg via ORAL
  Filled 2023-08-02 (×9): qty 2

## 2023-08-02 MED ORDER — SERTRALINE HCL 50 MG PO TABS
50.0000 mg | ORAL_TABLET | Freq: Every morning | ORAL | Status: DC
  Administered 2023-08-03 – 2023-08-10 (×8): 50 mg via ORAL
  Filled 2023-08-02 (×8): qty 1

## 2023-08-02 MED ORDER — SODIUM CHLORIDE 0.9 % IV SOLN: 500.0000 mg | INTRAVENOUS | Status: DC

## 2023-08-02 MED ORDER — LACTATED RINGERS IV BOLUS
1000.0000 mL | Freq: Once | INTRAVENOUS | Status: AC
  Administered 2023-08-02: 1000 mL via INTRAVENOUS

## 2023-08-02 MED ORDER — ISOSORBIDE MONONITRATE ER 30 MG PO TB24
15.0000 mg | ORAL_TABLET | Freq: Every day | ORAL | Status: DC
  Administered 2023-08-03 – 2023-08-10 (×8): 15 mg via ORAL
  Filled 2023-08-02 (×8): qty 1

## 2023-08-02 MED ORDER — SODIUM CHLORIDE 0.9 % IV SOLN
1.0000 g | Freq: Once | INTRAVENOUS | Status: AC
  Administered 2023-08-02: 1 g via INTRAVENOUS
  Filled 2023-08-02 (×2): qty 20

## 2023-08-02 MED ORDER — TRAZODONE HCL 50 MG PO TABS
150.0000 mg | ORAL_TABLET | Freq: Every day | ORAL | Status: DC
  Administered 2023-08-02 – 2023-08-09 (×8): 150 mg via ORAL
  Filled 2023-08-02 (×8): qty 1

## 2023-08-02 MED ORDER — CARVEDILOL 3.125 MG PO TABS
3.1250 mg | ORAL_TABLET | Freq: Two times a day (BID) | ORAL | Status: DC
  Administered 2023-08-03 – 2023-08-10 (×15): 3.125 mg via ORAL
  Filled 2023-08-02 (×15): qty 1

## 2023-08-02 MED ORDER — ACETAMINOPHEN 650 MG RE SUPP: 650.0000 mg | Freq: Four times a day (QID) | RECTAL | Status: DC | PRN

## 2023-08-02 MED ORDER — POTASSIUM CHLORIDE CRYS ER 20 MEQ PO TBCR
40.0000 meq | EXTENDED_RELEASE_TABLET | Freq: Once | ORAL | Status: AC
  Administered 2023-08-02: 40 meq via ORAL
  Filled 2023-08-02: qty 2

## 2023-08-02 MED ORDER — SODIUM CHLORIDE 0.9 % IV SOLN
500.0000 mg | INTRAVENOUS | Status: DC
  Filled 2023-08-02: qty 10

## 2023-08-02 MED ORDER — ONDANSETRON HCL 4 MG PO TABS: 4.0000 mg | ORAL_TABLET | Freq: Four times a day (QID) | ORAL | Status: DC | PRN

## 2023-08-02 MED ORDER — ONDANSETRON HCL 4 MG/2ML IJ SOLN
4.0000 mg | Freq: Four times a day (QID) | INTRAMUSCULAR | Status: DC | PRN
  Administered 2023-08-03 – 2023-08-08 (×2): 4 mg via INTRAVENOUS
  Filled 2023-08-02 (×2): qty 2

## 2023-08-02 MED ORDER — HYDROCODONE-ACETAMINOPHEN 5-325 MG PO TABS
1.0000 | ORAL_TABLET | ORAL | Status: DC | PRN
  Administered 2023-08-04 – 2023-08-07 (×3): 1 via ORAL
  Filled 2023-08-02 (×3): qty 1

## 2023-08-02 MED ORDER — SODIUM CHLORIDE 0.9 % IV SOLN
INTRAVENOUS | Status: AC
Start: 2023-08-02 — End: 2023-08-03

## 2023-08-02 MED ORDER — ACETAMINOPHEN 325 MG PO TABS
650.0000 mg | ORAL_TABLET | Freq: Four times a day (QID) | ORAL | Status: DC | PRN
  Administered 2023-08-03 – 2023-08-09 (×5): 650 mg via ORAL
  Filled 2023-08-02 (×5): qty 2

## 2023-08-02 NOTE — Assessment & Plan Note (Signed)
Currently appears to be on a dry side.  Evidence of AKI obtain electrolytes

## 2023-08-02 NOTE — Subjective & Objective (Signed)
Patient was sent in the emergency department today because she has been very fatigued has been having hematuria was seen by her primary care provider who noticed that she has acute renal failure with creatinine up to 5 up from baseline of 2 and progressive anemia with hemoglobin down to 9.7 down from 11 at baseline she was sent to emergency department for further evaluation patient herself feels very tired. Had a few episodes of nonbloody diarrhea last week Hemoccult negative in ER Not on any anticoagulation does has history of bladder cancer followed by urology Dr. Berneice Heinrich on 27 September had cystoscopy of left retrograde pyelogram and left ureteroscopy with laser ablation of the tumor with left ureteral stent placement

## 2023-08-02 NOTE — Assessment & Plan Note (Signed)
Chronic stable hold aspirin given hematuria continue Coreg 6.25 mg p.o. twice daily as well as Crestor 40 mg daily

## 2023-08-02 NOTE — Assessment & Plan Note (Signed)
Obtain TSH chest x-ray urine electrolytes gently rehydrate and follow fluid status

## 2023-08-02 NOTE — ED Triage Notes (Signed)
Patient arrives ambulatory by POV states she had labs drawn yesterday and sent here today for abnormal labs. Patient states she is seen at [facility] with Dr. Chanetta Marshall and called today states hem 9.7 and CR 513.1. patients spouse states patient also has hematuria and has been very fatigued.

## 2023-08-02 NOTE — Assessment & Plan Note (Addendum)
Followed by urology status post ureteral stent placement.  Will obtain CT to further evaluate to make sure it is functioning and there is no evidence of obstruction CT showing stable stent UA showing yeast and rare bacteria ER started on meropenem given hx of prior resitent UTI in the past Urine cult ure pending Urology consulted will see patient in a.m.

## 2023-08-02 NOTE — Assessment & Plan Note (Signed)
Was given a dose or Kdur in ER

## 2023-08-02 NOTE — ED Notes (Signed)
ED TO INPATIENT HANDOFF REPORT  Name/Age/Gender [name] 77 y.o. female  Code Status    Code Status Orders  (From admission, onward)           Start     Ordered   08/02/23 2043  Full code  Continuous       Question:  By:  Answer:  Consent: discussion documented in EHR   08/02/23 2043           Code Status History     Date Active Date Inactive Code Status Order ID Comments User Context   11/16/2022 0833 11/17/2022 1612 Full Code [id number]  Marcine Matar, MD Inpatient   05/30/2022 1128 06/11/2022 1535 Full Code [id number]  Lorin Glass, MD ED   05/15/2022 2214 05/16/2022 2025 Full Code [id number]  Carollee Herter, DO Inpatient   05/15/2022 2124 05/15/2022 2214 Full Code [id number]  Carollee Herter, DO Inpatient   04/21/2022 2144 04/26/2022 1823 Full Code [id number]  Lurline Del, MD ED   11/06/2019 1401 11/06/2019 2259 Full Code [id number]  Lyn Records, MD Inpatient   06/25/2019 0439 07/02/2019 2013 Full Code [id number]  Lorretta Harp, MD ED       Home/SNF/Other Home  Chief Complaint AKI (acute kidney injury) (HCC) [N17.9]  Level of Care/Admitting Diagnosis ED Disposition     ED Disposition  Admit   Condition  --   Comment  Hospital Area: [facility] [[id number]]  Level of Care: Progressive [102]  Admit to Progressive based on following criteria: NEPHROLOGY stable condition requiring close monitoring for AKI, requiring Hemodialysis or Peritoneal Dialysis either from expected electrolyte imbalance, acidosis, or fluid overload that can be managed by NIPPV or high flow oxygen.  May admit patient to [facility] or [facility] if equivalent level of care is available:: No  Covid Evaluation: Asymptomatic - no recent exposure (last 10 days) testing not required  Diagnosis: AKI (acute kidney injury) [facility]) [[id number]]  Admitting Physician: Therisa Doyne [[id number]]  Attending Physician: Therisa Doyne [[id number]]  Certification:: I certify this patient will need  inpatient services for at least 2 midnights  Expected Medical Readiness: 08/04/2023          Medical History Past Medical History:  Diagnosis Date   Anemia associated with chronic renal failure    CAD in native artery 10/2019   cardiologist-   dr Judie Petit. Izora Ribas;   a. 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   urologist--- dr dahlstedt/  oncologist--- dr Clelia Croft;   dx 09/ 2020 high grade urothelial  s/p laser ablation, ;  recurrent s/p BCG instillation,  completed chemo instilation 11/ 2022  and repeat chemo completed 05/ 2023   Chronic combined systolic and diastolic CHF (congestive heart failure) (HCC) 06/2019   followed by cardiology;   dx 09/ 2022 in setting of sepsis, pulm edema;   05/ 2022  ef 40-45% per echo,  recovered per cath 01/ 2021 ef 50%;  laste echo 08/ 2023  ef 60-65%   CKD (chronic kidney disease), stage III (HCC)    Depression    GAD (generalized anxiety disorder)    GERD (gastroesophageal reflux disease)    History of bladder cancer 12/2006   followed by dr Retta Diones   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   dx 04/ 2008  w/ poor function hydronephrotic  right kidney;   02-06-2007  s/p right nephroureterectomy  History of pulmonary embolism 04/2022   in setting severe sepsis;  small RLL, treated w/ 3 months eliquis   Hyperlipidemia, mixed    Hypertension    Solitary kidney, acquired 02/06/2007   s/p  right nephroureterectomy for cancer    Allergies Allergies  Allergen Reactions   Erythromycin Diarrhea and Nausea And Vomiting   Lotensin [Benazepril Hcl] Cough   Macrodantin [Nitrofurantoin] Diarrhea    And night sweats    IV Location/Drains/Wounds Patient Lines/Drains/Airways Status     Active Line/Drains/Airways     Name Placement date Placement time Site Days   Peripheral IV 08/02/23 20 G Left Antecubital 08/02/23  1634  Antecubital  less than 1    Ureteral Drain/Stent Left ureter 7 Fr. 07/06/23  1037  Left ureter  27            Labs/Imaging Results for orders placed or performed during the hospital encounter of 08/02/23 (from the past 48 hour(s))  CBC with Differential     Status: Abnormal   Collection Time: 08/02/23  4:33 PM  Result Value Ref Range   WBC 13.6 (H) 4.0 - 10.5 K/uL   RBC 3.21 (L) 3.87 - 5.11 MIL/uL   Hemoglobin 9.2 (L) 12.0 - 15.0 g/dL   HCT 16.1 (L) 09.6 - 04.5 %   MCV 91.6 80.0 - 100.0 fL   MCH 28.7 26.0 - 34.0 pg   MCHC 31.3 30.0 - 36.0 g/dL   RDW 40.9 81.1 - 91.4 %   Platelets 254 150 - 400 K/uL   nRBC 0.0 0.0 - 0.2 %   Neutrophils Relative % 73 %   Neutro Abs 9.9 (H) 1.7 - 7.7 K/uL   Lymphocytes Relative 14 %   Lymphs Abs 2.0 0.7 - 4.0 K/uL   Monocytes Relative 10 %   Monocytes Absolute 1.4 (H) 0.1 - 1.0 K/uL   Eosinophils Relative 2 %   Eosinophils Absolute 0.2 0.0 - 0.5 K/uL   Basophils Relative 0 %   Basophils Absolute 0.1 0.0 - 0.1 K/uL   Immature Granulocytes 1 %   Abs Immature Granulocytes 0.08 (H) 0.00 - 0.07 K/uL    Comment: Performed at [facility], [address] [place], [place] [address]  Comprehensive metabolic panel     Status: Abnormal   Collection Time: 08/02/23  4:33 PM  Result Value Ref Range   Sodium 131 (L) 135 - 145 mmol/L   Potassium 3.1 (L) 3.5 - 5.1 mmol/L   Chloride 101 98 - 111 mmol/L   CO2 19 (L) 22 - 32 mmol/L   Glucose, Bld 113 (H) 70 - 99 mg/dL    Comment: Glucose reference range applies only to samples taken after fasting for at least 8 hours.   BUN 56 (H) 8 - 23 mg/dL   Creatinine, Ser 6.21 (H) 0.44 - 1.00 mg/dL   Calcium 8.2 (L) 8.9 - 10.3 mg/dL   Total Protein 8.0 6.5 - 8.1 g/dL   Albumin 2.8 (L) 3.5 - 5.0 g/dL   AST 10 (L) 15 - 41 U/L   ALT 11 0 - 44 U/L   Alkaline Phosphatase 62 38 - 126 U/L   Total Bilirubin 0.6 0.3 - 1.2 mg/dL   GFR, Estimated 7 (L) >60 mL/min    Comment: (NOTE) Calculated using the CKD-EPI Creatinine Equation  (2021)    Anion gap 11 5 - 15    Comment: Performed at [facility], [address] [place], [place] [address]  POC  occult blood, ED RN will collect     Status: None   Collection Time: 08/02/23  5:55 PM  Result Value Ref Range   Fecal Occult Bld NEGATIVE NEGATIVE  Urinalysis, w/ Reflex to Culture (Infection Suspected) -Urine, Clean Catch     Status: Abnormal   Collection Time: 08/02/23  6:04 PM  Result Value Ref Range   Specimen Source URINE, CLEAN CATCH    Color, Urine YELLOW YELLOW   APPearance CLOUDY (A) CLEAR   Specific Gravity, Urine 1.006 1.005 - 1.030   pH 5.0 5.0 - 8.0   Glucose, UA NEGATIVE NEGATIVE mg/dL   Hgb urine dipstick LARGE (A) NEGATIVE   Bilirubin Urine NEGATIVE NEGATIVE   Ketones, ur NEGATIVE NEGATIVE mg/dL   Protein, ur 30 (A) NEGATIVE mg/dL   Nitrite NEGATIVE NEGATIVE   Leukocytes,Ua LARGE (A) NEGATIVE   RBC / HPF >50 0 - 5 RBC/hpf   WBC, UA >50 0 - 5 WBC/hpf    Comment:        Reflex urine culture not performed if WBC <=10, OR if Squamous epithelial cells >5. If Squamous epithelial cells >5 suggest recollection.    Bacteria, UA RARE (A) NONE SEEN   Squamous Epithelial / HPF 0-5 0 - 5 /HPF   WBC Clumps PRESENT    Budding Yeast PRESENT    Non Squamous Epithelial 0-5 (A) NONE SEEN    Comment: Performed at [facility], [address], [place], [place] [address]  Creatinine, urine, random     Status: None   Collection Time: 08/02/23  7:47 PM  Result Value Ref Range   Creatinine, Urine 60 mg/dL    Comment: Performed at [facility], [address] [place], [place] [address]  Sodium, urine, random     Status: None   Collection Time: 08/02/23  7:47 PM  Result Value Ref Range   Sodium, Ur 43 mmol/L    Comment: Performed at [facility], [address], [place], [place] [address]  Protime-INR     Status: Abnormal   Collection Time: 08/02/23  7:58 PM  Result Value Ref Range    Prothrombin Time 16.4 (H) 11.4 - 15.2 seconds   INR 1.3 (H) 0.8 - 1.2    Comment: (NOTE) INR goal varies based on device and disease states. Performed at [facility], [address], [place], [place] [address]    CT ABDOMEN PELVIS WO CONTRAST  Result Date: 08/02/2023 CLINICAL DATA:  Evaluate patency of the ureteral stent EXAM: CT ABDOMEN AND PELVIS WITHOUT CONTRAST TECHNIQUE: Multidetector CT imaging of the abdomen and pelvis was performed following the standard protocol without IV contrast. RADIATION DOSE REDUCTION: This exam was performed according to the departmental dose-optimization program which includes automated exposure control, adjustment of the mA and/or kV according to patient size and/or use of iterative reconstruction technique. COMPARISON:  04/21/2022 FINDINGS: Lower chest: No pleural or pericardial effusion. Coronary and aortic calcified plaque. Hepatobiliary: No focal liver abnormality is seen. No gallstones, gallbladder wall thickening, or biliary dilatation. Pancreas: Unremarkable. No pancreatic ductal dilatation or surrounding inflammatory changes. Spleen: Normal in size without focal abnormality. Adrenals/Urinary Tract: No adrenal mass. Right kidney absent. Normal left renal contour. Double-J left ureteral stent in expected location with only mild distension of the lower pole left renal collecting system, stable since previous. Mild inflammatory/edematous changes in the perinephric fat. Urinary bladder incompletely distended. Stomach/Bowel: Small hiatal hernia. Stomach nondistended. Small bowel decompressed. Appendix not identified. Colon is  partially distended, with a few scattered descending diverticula; no adjacent inflammatory change. Vascular/Lymphatic: Scattered heavy aortoiliac atheromatous calcifications without aneurysm. Left para-aortic retroperitoneal adenopathy, largest 1.5 cm (Im44,Se2) , previously 0.7 cm. A few subcentimeter aortocaval nodes. No  pelvic or mesenteric adenopathy. Reproductive: Status post hysterectomy. No adnexal masses. Other: Bilateral pelvic phleboliths.  No ascites.  No free air. Musculoskeletal: Multilevel thoracolumbar spondylitic change. IMPRESSION: 1. Left ureteral stent in expected location with persistent perinephric stranding, no hydronephrosis. 2. Left para-aortic retroperitoneal adenopathy, increased since previous. 3.  Aortic Atherosclerosis (ICD10-I70.0). Electronically Signed   By: Corlis Leak M.D.   On: 08/02/2023 20:03   DG Chest 2 View  Result Date: 08/02/2023 CLINICAL DATA:  fatigue, leukocytosis, hematuria EXAM: CHEST - 2 VIEW COMPARISON:  06/05/2022 FINDINGS: Lungs are clear. Heart size and mediastinal contours are within normal limits. Aortic Atherosclerosis (ICD10-170.0). No effusion. Visualized bones unremarkable. IMPRESSION: No acute cardiopulmonary disease. Electronically Signed   By: Corlis Leak M.D.   On: 08/02/2023 19:54    Pending Labs Unresulted Labs (From admission, onward)     Start     Ordered   08/03/23 0500  Vitamin B12  (Anemia Panel (PNL))  Tomorrow morning,   R        08/02/23 1935   08/03/23 0500  Folate  (Anemia Panel (PNL))  Tomorrow morning,   R        08/02/23 1935   08/03/23 0500  Prealbumin  Tomorrow morning,   R        08/02/23 1935   08/02/23 2300  Basic metabolic panel  Once,   STAT        08/02/23 1935   08/02/23 2007  CK  Once,   AD        08/02/23 2007   08/02/23 2007  Magnesium  Once,   AD        08/02/23 2007   08/02/23 2007  Procalcitonin  Once,   AD       References:    Procalcitonin Lower Respiratory Tract Infection AND Sepsis Procalcitonin Algorithm   08/02/23 2007   08/02/23 2007  Phosphorus  Once,   AD        08/02/23 2007   08/02/23 2007  Osmolality  Once,   AD        08/02/23 2007   08/02/23 1936  Iron and TIBC  (Anemia Panel (PNL))  Add-on,   AD        08/02/23 1935   08/02/23 1936  Ferritin  (Anemia Panel (PNL))  Add-on,   AD        08/02/23 1935    08/02/23 1936  Reticulocytes  (Anemia Panel (PNL))  Add-on,   AD        08/02/23 1935   08/02/23 1936  Osmolality, urine  Once,   URGENT        08/02/23 1935   08/02/23 1936  Urinalysis, Complete w Microscopic -Urine, Clean Catch  Once,   URGENT       Question Answer Comment  Release to patient Immediate   Specimen Source Urine, Clean Catch      08/02/23 1935   08/02/23 1936  Blood gas, venous  ONCE - STAT,   R       Question:  Release to patient  Answer:  Immediate   08/02/23 1935   08/02/23 1804  Urine Culture  Once,   AD        08/02/23 1804   Signed and Held  Magnesium  Tomorrow morning,   R        Signed and Held   Signed and Held  Phosphorus  Tomorrow morning,   R        Signed and Held   Signed and Held  Comprehensive metabolic panel  Tomorrow morning,   R       Question:  Release to patient  Answer:  Immediate   Signed and Held   Signed and Held  CBC  Tomorrow morning,   R       Question:  Release to patient  Answer:  Immediate   Signed and Held            Vitals/Pain Today's Vitals   08/02/23 1613 08/02/23 1614 08/02/23 1620 08/02/23 1835  BP: 138/63  (!) 146/57 (!) 127/51  Pulse: 72  65 71  Resp:  18 18 18   Temp: 98 F (36.7 C)     TempSrc: Oral     SpO2: 99%  97% 100%  Weight:  76.7 kg    Height:  5\' 3"  (1.6 m)    PainSc: 0-No pain       Isolation Precautions No active isolations  Medications Medications  meropenem (MERREM) 1 g in sodium chloride 0.9 % 100 mL IVPB (has no administration in time range)  potassium chloride SA (KLOR-CON M) CR tablet 40 mEq (40 mEq Oral Given 08/02/23 1833)  lactated ringers bolus 1,000 mL (1,000 mLs Intravenous New Bag/Given 08/02/23 1955)    Mobility walks

## 2023-08-02 NOTE — Assessment & Plan Note (Signed)
Hemoglobin drifted down to 9.2.  Obtain anemia panel Hemoccult negative suspect possible source being hematuria. Transfuse if needed for hemoglobin approaching 7

## 2023-08-02 NOTE — Assessment & Plan Note (Signed)
Possible UTI given complex urological history we will continue with meropenem for tonight Noted yeast in urine discussed with urology who feels that this is chronic no need to treat for tonight Await results of urine cultures

## 2023-08-02 NOTE — Assessment & Plan Note (Addendum)
Continue Coreg 3.125 mg twice daily if blood pressure allows continue Imdur 15 mg by mouth daily

## 2023-08-02 NOTE — ED Provider Notes (Signed)
[place] [facility] AT [facility] Provider Note   CSN: [id number] Arrival date & time: 08/02/23  1608     History  Chief Complaint  Patient presents with   Abnormal Labs    [name] is a 77 y.o. female with history of right nephrectomy secondary to cancer,  urothelial carcinoma, heart failure, hypertension, presents with concern for abnormal labs as told by her PCP Dr. Chanetta Marshall.  She was told her hemoglobin was low at 9.7.  She has been feeling more fatigued since her cystoscopy on 07/06/2023 where her stent was exchanged.  Otherwise feels at her baseline.  Denies any dysuria, hematuria, increased frequency. Reports some non-bloody diarrhea last week.  HPI     Home Medications Prior to Admission medications   Medication Sig Start Date End Date Taking? Authorizing Provider  acetaminophen (TYLENOL) 500 MG tablet Take 500-1,000 mg by mouth every 6 (six) hours as needed for mild pain.    [provider]  aspirin EC 81 MG tablet Take 1 tablet (81 mg total) by mouth daily. Swallow whole. 01/09/23   Dyann Kief, PA-C  calcium carbonate (TUMS EX) 750 MG chewable tablet Chew 1 tablet by mouth every morning.    [provider]  carvedilol (COREG) 6.25 MG tablet TAKE 1 TABLET(6.25 MG) BY MOUTH TWICE DAILY WITH A MEAL Patient taking differently: Take 3.125 mg by mouth in the morning and at bedtime. 02/07/23   Christell Constant, MD  cholecalciferol (VITAMIN D3) 25 MCG (1000 UNIT) tablet Take 1,000 Units by mouth every morning.    [provider]  Ferrous Sulfate (IRON PO) Take 1 tablet by mouth in the morning.    [provider]  icosapent Ethyl (VASCEPA) 1 g capsule Take 1 capsule (1 g total) by mouth 2 (two) times daily. 01/15/23 01/15/24  Dyann Kief, PA-C  isosorbide mononitrate (IMDUR) 30 MG 24 hr tablet TAKE 1/2 TABLET(15 MG) BY MOUTH DAILY 02/26/23   Chandrasekhar, Mahesh A, MD  oxyCODONE (ROXICODONE) 5 MG  immediate release tablet Take 1 tablet (5 mg total) by mouth every 6 (six) hours as needed for moderate pain or severe pain (post-operatively). 07/06/23 07/05/24  Loletta Parish., MD  rosuvastatin (CRESTOR) 40 MG tablet TAKE 1 TABLET(40 MG) BY MOUTH DAILY 02/07/23   Chandrasekhar, Mahesh A, MD  senna-docusate (SENOKOT-S) 8.6-50 MG tablet Take 1 tablet by mouth 2 (two) times daily. While taking strong pain meds to prevent constipation. 07/06/23   Loletta Parish., MD  sertraline (ZOLOFT) 50 MG tablet Take 50 mg by mouth every morning.    [provider]  traZODone (DESYREL) 150 MG tablet Take 150 mg by mouth at bedtime. 08/24/20   [provider]      Allergies    Erythromycin, Lotensin [benazepril hcl], and Macrodantin [nitrofurantoin]    Review of Systems   Review of Systems  Constitutional:  Negative for fever.    Physical Exam Updated Vital Signs BP (!) 127/51   Pulse 71   Temp 98 F (36.7 C) (Oral)   Resp 18   Ht 5\' 3"  (1.6 m)   Wt 76.7 kg   SpO2 100%   BMI 29.94 kg/m  Physical Exam Vitals and nursing note reviewed.  Constitutional:      General: She is not in acute distress.    Appearance: She is well-developed.  HENT:     Head: Normocephalic and atraumatic.  Eyes:     Conjunctiva/sclera: Conjunctivae normal.  Cardiovascular:     Rate and Rhythm: Normal rate and regular rhythm.     Heart sounds: No murmur heard. Pulmonary:     Effort: Pulmonary effort is normal. No respiratory distress.     Breath sounds: Normal breath sounds.  Abdominal:     Palpations: Abdomen is soft.     Tenderness: There is no abdominal tenderness.     Comments: Abdomen soft and nontender, normal bowel sounds  Musculoskeletal:        General: No swelling.     Cervical back: Neck supple.  Skin:    General: Skin is warm and dry.     Capillary Refill: Capillary refill takes less than 2 seconds.  Neurological:     Mental Status: She is alert.  Psychiatric:         Mood and Affect: Mood normal.     ED Results / Procedures / Treatments   Labs (all labs ordered are listed, but only abnormal results are displayed) Labs Reviewed  CBC WITH DIFFERENTIAL/PLATELET - Abnormal; Notable for the following components:      Result Value   WBC 13.6 (*)    RBC 3.21 (*)    Hemoglobin 9.2 (*)    HCT 29.4 (*)    Neutro Abs 9.9 (*)    Monocytes Absolute 1.4 (*)    Abs Immature Granulocytes 0.08 (*)    All other components within normal limits  COMPREHENSIVE METABOLIC PANEL - Abnormal; Notable for the following components:   Sodium 131 (*)    Potassium 3.1 (*)    CO2 19 (*)    Glucose, Bld 113 (*)    BUN 56 (*)    Creatinine, Ser 6.13 (*)    Calcium 8.2 (*)    Albumin 2.8 (*)    AST 10 (*)    GFR, Estimated 7 (*)    All other components within normal limits  URINALYSIS, W/ REFLEX TO CULTURE (INFECTION SUSPECTED)  VITAMIN B12  FOLATE  IRON AND TIBC  FERRITIN  RETICULOCYTES  OSMOLALITY, URINE  CREATININE, URINE, RANDOM  SODIUM, URINE, RANDOM  PREALBUMIN  PROTIME-INR  URINALYSIS, COMPLETE (UACMP) WITH MICROSCOPIC  BLOOD GAS, VENOUS  BASIC METABOLIC PANEL  CK  MAGNESIUM  PROCALCITONIN  PHOSPHORUS  OSMOLALITY  POC OCCULT BLOOD, ED    EKG None  Radiology DG Chest 2 View  Result Date: 08/02/2023 CLINICAL DATA:  fatigue, leukocytosis, hematuria EXAM: CHEST - 2 VIEW COMPARISON:  06/05/2022 FINDINGS: Lungs are clear. Heart size and mediastinal contours are within normal limits. Aortic Atherosclerosis (ICD10-170.0). No effusion. Visualized bones unremarkable. IMPRESSION: No acute cardiopulmonary disease. Electronically Signed   By: Corlis Leak M.D.   On: 08/02/2023 19:54    Procedures Procedures    Medications Ordered in ED Medications  potassium chloride SA (KLOR-CON M) CR tablet 40 mEq (40 mEq Oral Given 08/02/23 1833)  lactated ringers bolus 1,000 mL (1,000 mLs Intravenous New Bag/Given 08/02/23 1955)    ED Course/ Medical Decision  Making/ A&P                                   Differential diagnosis includes but is not limited to UTI, AKI, pyelonephritis, anemia, gastrointestinal bleed, ureteral injury  ED Course:  Patient overall well-appearing, no acute distress.  Her vital signs are stable.  Her creatinine here today is at 6.13, this was elevated from 2.6 taken 1 month ago.  Had some diarrhea last  week, but low concern for pre-renal etiology. Will still give 1L LR bolus. She recently had cystoscopy, and has more concern for possible stent blockage. She also has slight hypokalemia at 3.1, was given 40 mEq KCl repletion.  Also slightly hyponatremic at 131.  She also was found to have a hemoglobin at 9.2, this is down from 11.4 taken 1 month ago.  She does endorse feeling more tired than normal.  No melanotic or bloody stools. CT abdomen pelvis was obtained to evaluate for ureteral stent patency and chest x-ray obtained to evaluate for any further sources of infection given leukocytosis of 13.6.  Given recent cystoscopy, suspect she may have a urinary source of infection.  Urinalysis pending.  Low concern for pyelonephritis at this time given no abdominal pain, back pain, nausea vomiting, vital signs stable. Given currently has 1 kidney, and the significant AKI, patient was admitted with Dr. Adela Glimpse.   Impression: AKI Anemia  Disposition:  Admitted with Dr. Adela Glimpse for further workup and management of AKI  Lab Tests: I Ordered, and personally interpreted labs.  The pertinent results include:   CBC with leukocytosis of 13.6, hemoglobin of 9.2 down from 11.4 one month ago CMP with creatinine at 6.13 up from 2.6 one month ago, hypokalemia at 3.1, hyponatremia 131 Hemoccult negative Urinalysis penidng  Imaging Studies ordered: I ordered imaging studies including CT abdomen and pelvis, chest x ray, image results pending  Consultations Obtained: I requested consultation with the hospitalist Dr Adela Glimpse,  and discussed  lab and imaging findings as well as pertinent plan - they recommend: admission, recommend getting CT abdomen and pelvis and Chest x-ray also  External records from outside source obtained and reviewed including cystoscopy from 07/06/23 with Dr. Berneice Heinrich with urology for stent exchange               Final Clinical Impression(s) / ED Diagnoses Final diagnoses:  Acute kidney injury (HCC)  Anemia, unspecified type    Rx / DC Orders ED Discharge Orders     None         Arabella Merles, PA-C 08/02/23 2000    Lonell Grandchild, MD 08/02/23 2023

## 2023-08-02 NOTE — Assessment & Plan Note (Signed)
Continue Crestor 40 mg p.o. daily 

## 2023-08-02 NOTE — Assessment & Plan Note (Signed)
Most likely secondary to dehydration patient have had decreased p.o. input Order strict I's and O's rehydrate aggressively follow renal function Obtain urine electrolytes If no improvement may need nephrology consult. CT showing stent in good position

## 2023-08-02 NOTE — H&P (Addendum)
[name] ZOX:[id number] DOB: [birth year] DOA: 08/02/2023    PCP: Shon Hale, MD   Outpatient Specialists:  CARDS: Dr. Christell Constant, MD  Nephrology Dr. Juel Burrow Urology Dr.  Unknown Foley  Patient arrived to ER on 08/02/23 at 1608 Referred by Attending Therisa Doyne, MD   Patient coming from:    home Lives  With family    Chief Complaint:   Chief Complaint  Patient presents with   Abnormal Labs    HPI: [name] is a 77 y.o. female with medical history significant of CAD, bladder cancer, anemia, diastolic CHF, hx of PE 2022    Presented with fatigue abnormal labs Patient was sent in the emergency department today because she has been very fatigued has been having hematuria was seen by her primary care provider who noticed that she has acute renal failure with creatinine up to 5 up from baseline of 2 and progressive anemia with hemoglobin down to 9.7 down from 11 at baseline she was sent to emergency department for further evaluation patient herself feels very tired. Had a few episodes of nonbloody diarrhea last week Hemoccult negative in ER Not on any anticoagulation does has history of bladder cancer followed by urology Dr. Berneice Heinrich on 27 September had cystoscopy of left retrograde pyelogram and left ureteroscopy with laser ablation of the tumor with left ureteral stent placement   Denies any CP no SOB  Reports generalized fatigue, not eating or drinking well Has not had anyc chemo since last year Reports  bit of chill no fever, no dysuria  Denies significant ETOH intake   Does not smoke   Lab Results  Component Value Date   SARSCOV2NAA NEGATIVE 05/30/2022   SARSCOV2NAA NEGATIVE 04/21/2022   SARSCOV2NAA NEGATIVE 09/13/2020   SARSCOV2NAA NEGATIVE 05/06/2020    Regarding pertinent Chronic problems:    Hyperlipidemia -  on statins Crestor Lipid Panel     Component Value Date/Time   CHOL 169 01/11/2023 0827   TRIG 273 (H) 01/11/2023 0827    HDL 51 01/11/2023 0827   CHOLHDL 3.3 01/11/2023 0827   CHOLHDL 3.2 07/01/2019 0443   VLDL 23 07/01/2019 0443   LDLCALC 74 01/11/2023 0827   LABVLDL 44 (H) 01/11/2023 0827     HTN on Coreg Imdur   chronic CHF diastolic/systolic/ combined - last echo  Recent Results (from the past 81191 hour(s))  ECHOCARDIOGRAM COMPLETE   Collection Time: 06/01/22  3:47 PM  Result Value   Weight 2,624   Height 63   BP 139/64   Single Plane A2C EF 61.4   Single Plane A4C EF 65.8   Calc EF 63.4   S' Lateral 2.90   AR max vel 1.89   AV Area VTI 1.79   AV Mean grad 7.5   AV Peak grad 13.6   Ao pk vel 1.85   Area-P 1/2 3.99   AV Area mean vel 1.89   MV VTI 1.72   Narrative      ECHOCARDIOGRAM REPORT      1. Left ventricular ejection fraction, by estimation, is 60 to 65%. Left ventricular ejection fraction by 3D volume is 63 %. The left ventricle has normal function. The left ventricle has no regional wall motion abnormalities. Left ventricular diastolic  parameters are consistent with Grade I diastolic dysfunction (impaired relaxation).  2. Right ventricular systolic function is normal. The right ventricular size is normal. There is normal pulmonary artery systolic pressure. The estimated right ventricular systolic pressure is  35.5 mmHg.  3. The mitral valve is grossly normal. Mild mitral valve regurgitation.  4. The aortic valve is tricuspid. There is mild calcification of the aortic valve. There is mild thickening of the aortic valve. Aortic valve regurgitation is not visualized. Aortic valve sclerosis/calcification is present, without any evidence of  aortic stenosis.  5. There is Severe (Grade IV) plaque involving the ascending aorta.  6. The inferior vena cava is normal in size with <50% respiratory variability, suggesting right atrial pressure of 8 mmHg.  Comparison(s): Compared to prior TTE on 02/2021, there is no significant change.               CAD  - On Aspirin, statin,  betablocker,                  -  followed by cardiology           Hx of DVT/PE  - not on  anticoagulation  hx of hematuria    CKD stage IIIb-   baseline Cr 2.3 Estimated Creatinine Clearance: 7.8 mL/min (A) (by C-G formula based on SCr of 6.13 mg/dL (H)).  Lab Results  Component Value Date   CREATININE 6.13 (H) 08/02/2023   CREATININE 2.60 (H) 06/18/2023   CREATININE 2.23 (H) 02/12/2023   Lab Results  Component Value Date   NA 131 (L) 08/02/2023   CL 101 08/02/2023   K 3.1 (L) 08/02/2023   CO2 19 (L) 08/02/2023   BUN 56 (H) 08/02/2023   CREATININE 6.13 (H) 08/02/2023   GFRNONAA 7 (L) 08/02/2023   CALCIUM 8.2 (L) 08/02/2023   PHOS 4.6 05/31/2022   ALBUMIN 2.8 (L) 08/02/2023   GLUCOSE 113 (H) 08/02/2023     Chronic anemia - baseline hg Hemoglobin & Hematocrit  Recent Labs    02/12/23 1512 06/18/23 1333 08/02/23 1633  HGB 12.7 11.4* 9.2*   Iron/TIBC/Ferritin/ %Sat    Component Value Date/Time   IRON 38 05/16/2022 0026   TIBC 259 05/16/2022 0026   FERRITIN 224 06/18/2023 1334   IRONPCTSAT 15 05/16/2022 0026     Cancer:  Bladder cancer followed by Dr. Unknown Foley   While in ER:   Found to have acute on chronic AKI and worsening anemia Hemoccult negative    Lab Orders         CBC with Differential         Comprehensive metabolic panel         Urinalysis, w/ Reflex to Culture (Infection Suspected) -Urine, Clean Catch         Vitamin B12         Folate         Iron and TIBC         Ferritin         Reticulocytes         Procalcitonin         Magnesium         Phosphorus         Osmolality, urine         Osmolality         Creatinine, urine, random         Sodium, urine, random         Prealbumin         Protime-INR         Urinalysis, Complete w Microscopic -Urine, Clean Catch         Blood gas, venous         CK  Basic metabolic panel         POC occult blood, ED RN will collect        CXR -  No acute cardiopulmonary disease    CTabd/pelvis -  . Left ureteral stent in expected location with persistent perinephric stranding, no hydronephrosis.  . Left para-aortic retroperitoneal adenopathy, increased since previous.  Following Medications were ordered in ER: Medications  lactated ringers bolus 1,000 mL (has no administration in time range)  potassium chloride SA (KLOR-CON M) CR tablet 40 mEq (40 mEq Oral Given 08/02/23 1833)       ED Triage Vitals  Encounter Vitals Group     BP 08/02/23 1613 138/63     Systolic BP Percentile --      Diastolic BP Percentile --      Pulse Rate 08/02/23 1613 72     Resp 08/02/23 1614 18     Temp 08/02/23 1613 98 F (36.7 C)     Temp Source 08/02/23 1613 Oral     SpO2 08/02/23 1613 99 %     Weight 08/02/23 1614 169 lb (76.7 kg)     Height 08/02/23 1614 5\' 3"  (1.6 m)     Head Circumference --      Peak Flow --      Pain Score 08/02/23 1613 0     Pain Loc --      Pain Education --      Exclude from Growth Chart --   ONGE(95)@     _________________________________________ Significant initial  Findings: Abnormal Labs Reviewed  CBC WITH DIFFERENTIAL/PLATELET - Abnormal; Notable for the following components:      Result Value   WBC 13.6 (*)    RBC 3.21 (*)    Hemoglobin 9.2 (*)    HCT 29.4 (*)    Neutro Abs 9.9 (*)    Monocytes Absolute 1.4 (*)    Abs Immature Granulocytes 0.08 (*)    All other components within normal limits  COMPREHENSIVE METABOLIC PANEL - Abnormal; Notable for the following components:   Sodium 131 (*)    Potassium 3.1 (*)    CO2 19 (*)    Glucose, Bld 113 (*)    BUN 56 (*)    Creatinine, Ser 6.13 (*)    Calcium 8.2 (*)    Albumin 2.8 (*)    AST 10 (*)    GFR, Estimated 7 (*)    All other components within normal limits         ECG: Ordered Personally reviewed and interpreted by me showing: HR : 94 Rhythm: Sinus rhythm Atrial premature complexes in couplets Short PR interval RSR' in V1 or V2, probably normal variant QTC 429    The recent clinical data is shown below. Vitals:   08/02/23 1613 08/02/23 1614 08/02/23 1620 08/02/23 1835  BP: 138/63  (!) 146/57 (!) 127/51  Pulse: 72  65 71  Resp:  18 18 18   Temp: 98 F (36.7 C)     TempSrc: Oral     SpO2: 99%  97% 100%  Weight:  76.7 kg    Height:  5\' 3"  (1.6 m)      WBC     Component Value Date/Time   WBC 13.6 (H) 08/02/2023 1633   LYMPHSABS 2.0 08/02/2023 1633   MONOABS 1.4 (H) 08/02/2023 1633   EOSABS 0.2 08/02/2023 1633   BASOSABS 0.1 08/02/2023 1633     Lactic Acid, Venous    Component Value Date/Time   LATICACIDVEN  1.3 05/30/2022 1246     Procalcitonin   Ordered      UA yeast in URine, hematuria   Urine analysis:    Component Value Date/Time   COLORURINE YELLOW 08/02/2023 1804   APPEARANCEUR CLOUDY (A) 08/02/2023 1804   LABSPEC 1.006 08/02/2023 1804   PHURINE 5.0 08/02/2023 1804   GLUCOSEU NEGATIVE 08/02/2023 1804   HGBUR LARGE (A) 08/02/2023 1804   BILIRUBINUR NEGATIVE 08/02/2023 1804   KETONESUR NEGATIVE 08/02/2023 1804   PROTEINUR 30 (A) 08/02/2023 1804   NITRITE NEGATIVE 08/02/2023 1804   LEUKOCYTESUR LARGE (A) 08/02/2023 1804    Results for orders placed or performed during the hospital encounter of 11/16/22  Urine Culture     Status: None   Collection Time: 11/16/22  8:05 AM   Specimen: Urine, Cystoscope  Result Value Ref Range Status   Specimen Description   Final    URINE, RANDOM CYTOSCOPE Performed at [facility], [address] [place], [place] [address]    Special Requests   Final    NONE Performed at [facility], [address], [place], [place] [address]    Culture   Final    NO GROWTH Performed at [facility], [address], [place], [place] [address]    Report Status 11/17/2022 FINAL  Final     __________________________________________________________ Recent Labs  Lab 08/02/23 1633  NA 131*  K 3.1*  CO2 19*  GLUCOSE 113*  BUN 56*  CREATININE  6.13*  CALCIUM 8.2*    Cr   Up from baseline see below Lab Results  Component Value Date   CREATININE 6.13 (H) 08/02/2023   CREATININE 2.60 (H) 06/18/2023   CREATININE 2.23 (H) 02/12/2023    Recent Labs  Lab 08/02/23 1633  AST 10*  ALT 11  ALKPHOS 62  BILITOT 0.6  PROT 8.0  ALBUMIN 2.8*   Lab Results  Component Value Date   CALCIUM 8.2 (L) 08/02/2023   PHOS 4.6 05/31/2022    Plt: Lab Results  Component Value Date   PLT 254 08/02/2023       Recent Labs  Lab 08/02/23 1633  WBC 13.6*  NEUTROABS 9.9*  HGB 9.2*  HCT 29.4*  MCV 91.6  PLT 254    HG/HCT Down   from baseline see below    Component Value Date/Time   HGB 9.2 (L) 08/02/2023 1633   HGB 11.4 (L) 06/18/2023 1333   HGB 13.1 11/04/2019 1058   HCT 29.4 (L) 08/02/2023 1633   HCT 39.5 11/04/2019 1058   MCV 91.6 08/02/2023 1633   MCV 91 11/04/2019 1058    _____________________________________ Hospitalist was called for admission for   Acute kidney injury in the setting of CKD Anemia,      The following Work up has been ordered so far:  Orders Placed This Encounter  Procedures   CT ABDOMEN PELVIS WO CONTRAST   DG Chest 2 View   CBC with Differential   Comprehensive metabolic panel   Urinalysis, w/ Reflex to Culture (Infection Suspected) -Urine, Clean Catch   Vitamin B12   Folate   Iron and TIBC   Ferritin   Reticulocytes   Procalcitonin   Magnesium   Phosphorus   Osmolality, urine   Osmolality   Creatinine, urine, random   Sodium, urine, random   Prealbumin   Protime-INR   Urinalysis, Complete w Microscopic -Urine, Clean Catch   Blood gas, venous   CK   Basic metabolic panel  Cardiac Monitoring - Continuous Indefinite   Consult to hospitalist   POC occult blood, ED RN will collect   Insert peripheral IV   Admit to Inpatient (patient's expected length of stay will be greater than 2 midnights or inpatient only procedure)     OTHER Significant initial  Findings:  labs showing:      DM  labs:  HbA1C: No results for input(s): "HGBA1C" in the last 8760 hours.     CBG (last 3)  No results for input(s): "GLUCAP" in the last 72 hours.        Cultures:    Component Value Date/Time   SDES  11/16/2022 0805    URINE, RANDOM CYTOSCOPE Performed at [facility], [address] [place], [place] [address]    SPECREQUEST  11/16/2022 0805    NONE Performed at [facility], [address], [place], [place] [address]    CULT  11/16/2022 0805    NO GROWTH Performed at [facility], [address], [place], [place] [address]    REPTSTATUS 11/17/2022 FINAL 11/16/2022 0805     Radiological Exams on Admission: No results found. _______________________________________________________________________________________________________ Latest  Blood pressure (!) 127/51, pulse 71, temperature 98 F (36.7 C), temperature source Oral, resp. rate 18, height 5\' 3"  (1.6 m), weight 76.7 kg, SpO2 100%.   Vitals  labs and radiology finding personally reviewed  Review of Systems:    Pertinent positives include: hematuria  Constitutional:  No weight loss, night sweats, Fevers, chills, fatigue, weight loss  HEENT:  No headaches, Difficulty swallowing,Tooth/dental problems,Sore throat,  No sneezing, itching, ear ache, nasal congestion, post nasal drip,  Cardio-vascular:  No chest pain, Orthopnea, PND, anasarca, dizziness, palpitations.no Bilateral lower extremity swelling  GI:  No heartburn, indigestion, abdominal pain, nausea, vomiting, diarrhea, change in bowel habits, loss of appetite, melena, blood in stool, hematemesis Resp:  no shortness of breath at rest. No dyspnea on exertion, No excess mucus, no productive cough, No non-productive cough, No coughing up of blood.No change in color of mucus.No wheezing. Skin:  no rash or lesions. No jaundice GU:  no dysuria, change in color of urine, no urgency or frequency. No straining to  urinate.  No flank pain.  Musculoskeletal:  No joint pain or no joint swelling. No decreased range of motion. No back pain.  Psych:  No change in mood or affect. No depression or anxiety. No memory loss.  Neuro: no localizing neurological complaints, no tingling, no weakness, no double vision, no gait abnormality, no slurred speech, no confusion  All systems reviewed and apart from HOPI all are negative _______________________________________________________________________________________________ Past Medical History:   Past Medical History:  Diagnosis Date   Anemia associated with chronic renal failure    CAD in native artery 10/2019   cardiologist-   dr Judie Petit. Izora Ribas;   a. 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   urologist--- dr dahlstedt/  oncologist--- dr Clelia Croft;   dx 09/ 2020 high grade urothelial  s/p laser ablation, ;  recurrent s/p BCG instillation,  completed chemo instilation 11/ 2022  and repeat chemo completed 05/ 2023   Chronic combined systolic and diastolic CHF (congestive heart failure) (HCC) 06/2019   followed by cardiology;   dx 09/ 2022 in setting of sepsis, pulm edema;   05/ 2022  ef 40-45% per echo,  recovered per cath 01/ 2021 ef 50%;  laste echo 08/ 2023  ef 60-65%   CKD (chronic kidney disease), stage III (HCC)    Depression    GAD (generalized anxiety disorder)    GERD (gastroesophageal reflux disease)    History of bladder cancer 12/2006   followed by dr Retta Diones   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   dx 04/ 2008  w/ poor function hydronephrotic  right kidney;   02-06-2007  s/p right nephroureterectomy   History of pulmonary embolism 04/2022   in setting severe sepsis;  small RLL, treated w/ 3 months eliquis   Hyperlipidemia, mixed    Hypertension    Solitary kidney, acquired 02/06/2007   s/p  right nephroureterectomy for cancer     Past  Surgical History:  Procedure Laterality Date   CATARACT EXTRACTION W/ INTRAOCULAR LENS IMPLANT Bilateral 2011   CYSTOSCOPY W/ URETERAL STENT PLACEMENT Left 11/17/2019   Procedure: CYSTOSCOPY WITH STENT REPLACEMENT;  Surgeon: Marcine Matar, MD;  Location: [facility];  Service: Urology;  Laterality: Left;   CYSTOSCOPY W/ URETERAL STENT PLACEMENT Left 03/23/2022   Procedure: CYSTOSCOPY WITH RETROGRADE PYELOGRAM, LEFT URETERAL STENT PLACEMENT;  Surgeon: Marcine Matar, MD;  Location: [facility];  Service: Urology;  Laterality: Left;   CYSTOSCOPY W/ URETERAL STENT PLACEMENT Left 06/09/2022   Procedure: CYSTOSCOPY WITH LEFT STENT EXCHANGE;  Surgeon: Marcine Matar, MD;  Location: [facility];  Service: Urology;  Laterality: Left;   CYSTOSCOPY W/ URETERAL STENT PLACEMENT Left 12/04/2022   Procedure: CYSTOSCOPY WITH STENT REPLACEMENT;  Surgeon: Marcine Matar, MD;  Location: [facility];  Service: Urology;  Laterality: Left;   CYSTOSCOPY W/ URETERAL STENT REMOVAL Left 05/10/2020   Procedure: CYSTOSCOPY WITH STENT REMOVAL;  Surgeon: Marcine Matar, MD;  Location: [facility];  Service: Urology;  Laterality: Left;   CYSTOSCOPY W/ URETERAL STENT REMOVAL Left 05/02/2021   Procedure: CYSTOSCOPY WITH STENT REMOVAL;  Surgeon: Marcine Matar, MD;  Location: [facility];  Service: Urology;  Laterality: Left;   CYSTOSCOPY W/ URETERAL STENT REMOVAL Left 11/16/2022   Procedure: CYSTOSCOPY WITH STENT REMOVAL;  Surgeon: Marcine Matar, MD;  Location: [facility];  Service: Urology;  Laterality: Left;   CYSTOSCOPY WITH RETROGRADE PYELOGRAM, URETEROSCOPY AND STENT PLACEMENT Left 06/27/2019   Procedure: CYSTOSCOPY WITH LEFT RETROGRADE PYELOGRAM, URETEROSCOPY, BIOPSY AND LEFT STENT PLACEMENT;  Surgeon: Bjorn Pippin, MD;  Location: [facility];  Service: Urology;  Laterality: Left;   CYSTOSCOPY WITH RETROGRADE  PYELOGRAM, URETEROSCOPY AND STENT PLACEMENT Left 08/18/2019   Procedure: CYSTOSCOPY, URETEROSCOPY AND STENT EXCHANGE;  Surgeon: Marcine Matar, MD;  Location: [facility];  Service: Urology;  Laterality: Left;  90 MINS   CYSTOSCOPY WITH RETROGRADE PYELOGRAM, URETEROSCOPY AND STENT PLACEMENT Left 11/17/2019   Procedure: CYSTOSCOPY WITH RETROGRADE PYELOGRAM, URETEROSCOPY AND STENT PLACEMENT;  Surgeon: Marcine Matar, MD;  Location: [facility];  Service: Urology;  Laterality: Left;  90 MINS   CYSTOSCOPY WITH RETROGRADE PYELOGRAM, URETEROSCOPY AND STENT PLACEMENT Left 05/10/2020   Procedure: CYSTOSCOPY WITH RETROGRADE PYELOGRAM, URETEROSCOPY AND STENT PLACEMENT WITH URETHRAL DIALATION AND BRUSH BIOPSY;  Surgeon: Marcine Matar, MD;  Location: [facility];  Service: Urology;  Laterality: Left;  1 HR   CYSTOSCOPY WITH RETROGRADE PYELOGRAM, URETEROSCOPY AND STENT PLACEMENT Left 09/16/2020   Procedure: CYSTOSCOPY WITH RETROGRADE PYELOGRAM, URETEROSCOPY ,  LITHOPAXY, LEFT URETERAL BRUSHING, AND STENT REPLACEMENT;  Surgeon: Marcine Matar, MD;  Location: [facility];  Service: Urology;  Laterality: Left;   CYSTOSCOPY WITH RETROGRADE PYELOGRAM,  URETEROSCOPY AND STENT PLACEMENT Left 10/24/2021   Procedure: CYSTOSCOPY WITH RETROGRADE PYELOGRAM, URETEROSCOPY, POSSIBLE URETERAL AND RENAL BIOPSIES AND STENT PLACEMENT;  Surgeon: Marcine Matar, MD;  Location: [facility];  Service: Urology;  Laterality: Left;  1 HR   CYSTOSCOPY WITH RETROGRADE PYELOGRAM, URETEROSCOPY AND STENT PLACEMENT Left 03/09/2022   Procedure: CYSTOSCOPY WITH ANTEGRADE PYELOGRAM,  STENT REMOVAL;  Surgeon: Marcine Matar, MD;  Location: [facility];  Service: Urology;  Laterality: Left;   CYSTOSCOPY WITH RETROGRADE PYELOGRAM, URETEROSCOPY AND STENT PLACEMENT  01/09/2007   @[facility]  by dr Retta Diones;    bx's/ washing  bladder , right ureter   CYSTOSCOPY WITH  RETROGRADE PYELOGRAM, URETEROSCOPY AND STENT PLACEMENT Left 11/16/2022   Procedure: CYSTOSCOPY WITH RETROGRADE PYELOGRAM, URETEROSCOPY AND STENT REPLACEMENT;  Surgeon: Marcine Matar, MD;  Location: [facility];  Service: Urology;  Laterality: Left;  90 MINS   CYSTOSCOPY WITH RETROGRADE PYELOGRAM, URETEROSCOPY AND STENT PLACEMENT Left 07/06/2023   Procedure: CYSTOSCOPY WITH LEFT RETROGRADE PYELOGRAM, URETEROSCOPY, LASER ABLATION OF TUMOR AND STENT EXCHANGE;  Surgeon: Loletta Parish., MD;  Location: [facility];  Service: Urology;  Laterality: Left;  60 MINUTES NEEDED FOR CASE   CYSTOSCOPY/URETEROSCOPY/HOLMIUM LASER Left 12/04/2022   Procedure: CYSTOSCOPY/URETEROSCOPY/  HOLMIUM LASER OF RENAL PELVIC LESIONS;  Surgeon: Marcine Matar, MD;  Location: [facility];  Service: Urology;  Laterality: Left;   CYSTOSCOPY/URETEROSCOPY/HOLMIUM LASER/STENT PLACEMENT Left 05/02/2021   Procedure: CYSTOSCOPY/URETEROSCOPY WITH BRUSH BIOPSY/ RETROGRADE PYELOGRAM/ HOLMIUM LASER/STENT REPLACEMENT;  Surgeon: Marcine Matar, MD;  Location: [facility];  Service: Urology;  Laterality: Left;   HOLMIUM LASER APPLICATION Left 09/16/2020   Procedure: HOLMIUM LASER APPLICATION;  Surgeon: Marcine Matar, MD;  Location: [facility];  Service: Urology;  Laterality: Left;   HOLMIUM LASER APPLICATION Left 10/24/2021   Procedure: HOLMIUM LASER APPLICATION OF TUMORS;  Surgeon: Marcine Matar, MD;  Location: [facility];  Service: Urology;  Laterality: Left;   HOLMIUM LASER APPLICATION Left 11/16/2022   Procedure: HOLMIUM LASER OF TUMORS;  Surgeon: Marcine Matar, MD;  Location: [facility];  Service: Urology;  Laterality: Left;   IR NEPHROSTOMY PLACEMENT LEFT  07/06/2021   IR NEPHROSTOMY PLACEMENT LEFT  02/06/2022   NEPHROSTOMY TUBE REMOVAL Left 03/23/2022   Procedure: NEPHROSTOMY TUBE REMOVAL;  Surgeon: Marcine Matar, MD;   Location[facility];  Service: Urology;  Laterality: Left;   NEPHROURETERECTOMY Right 02/06/2007   @[facility]  by dr Retta Diones;   Laparoscopic   RIGHT/LEFT HEART CATH AND CORONARY ANGIOGRAPHY N/A 11/06/2019   Procedure: RIGHT/LEFT HEART CATH AND CORONARY ANGIOGRAPHY;  Surgeon: Lyn Records, MD;  Location: [facility];  Service: Cardiovascular;  Laterality: N/A;   THULIUM LASER TURP (TRANSURETHRAL RESECTION OF PROSTATE) Left 08/18/2019   Procedure: THULIUM LASER ABLATION OF URETERAL TUMOR;  Surgeon: Marcine Matar, MD;  Location: [facility];  Service: Urology;  Laterality: Left;   THULIUM LASER TURP (TRANSURETHRAL RESECTION OF PROSTATE) Left 11/17/2019   Procedure: THULIUM LASER of URETERAL CANCER;  Surgeon: Marcine Matar, MD;  Location: [facility]  Service: Urology;  Laterality: Left;   TRANSURETHRAL RESECTION OF BLADDER TUMOR  12/12/2006   @[facility]  by dr Vonita Moss   VAGINAL HYSTERECTOMY  1980    Social History:  Ambulatory cane, walker     reports that she quit smoking about 19 years ago. Her smoking use included cigarettes. She started smoking about 57 years ago. She has a 19 pack-year smoking history. She has  never used smokeless tobacco. She reports that she does not currently use alcohol. She reports that she does not use drugs.     Family History:   Family History  Problem Relation Age of Onset   Hypertension Mother    Diabetes Mellitus II Sister    Hypertension Sister    Hypertension Brother    ______________________________________________________________________________________________ Allergies: Allergies  Allergen Reactions   Erythromycin Diarrhea and Nausea And Vomiting   Lotensin [Benazepril Hcl] Cough   Macrodantin [Nitrofurantoin] Diarrhea    And night sweats     Prior to Admission medications   Medication Sig Start Date End Date Taking? Authorizing Provider  acetaminophen (TYLENOL) 500 MG tablet Take 500-1,000 mg by  mouth every 6 (six) hours as needed for mild pain.    [provider]  aspirin EC 81 MG tablet Take 1 tablet (81 mg total) by mouth daily. Swallow whole. 01/09/23   Dyann Kief, PA-C  calcium carbonate (TUMS EX) 750 MG chewable tablet Chew 1 tablet by mouth every morning.    [provider]  carvedilol (COREG) 6.25 MG tablet TAKE 1 TABLET(6.25 MG) BY MOUTH TWICE DAILY WITH A MEAL Patient taking differently: Take 3.125 mg by mouth in the morning and at bedtime. 02/07/23   Christell Constant, MD  cholecalciferol (VITAMIN D3) 25 MCG (1000 UNIT) tablet Take 1,000 Units by mouth every morning.    [provider]  Ferrous Sulfate (IRON PO) Take 1 tablet by mouth in the morning.    [provider]  icosapent Ethyl (VASCEPA) 1 g capsule Take 1 capsule (1 g total) by mouth 2 (two) times daily. 01/15/23 01/15/24  Dyann Kief, PA-C  isosorbide mononitrate (IMDUR) 30 MG 24 hr tablet TAKE 1/2 TABLET(15 MG) BY MOUTH DAILY 02/26/23   Chandrasekhar, Mahesh A, MD  oxyCODONE (ROXICODONE) 5 MG immediate release tablet Take 1 tablet (5 mg total) by mouth every 6 (six) hours as needed for moderate pain or severe pain (post-operatively). 07/06/23 07/05/24  Loletta Parish., MD  rosuvastatin (CRESTOR) 40 MG tablet TAKE 1 TABLET(40 MG) BY MOUTH DAILY 02/07/23   Chandrasekhar, Mahesh A, MD  senna-docusate (SENOKOT-S) 8.6-50 MG tablet Take 1 tablet by mouth 2 (two) times daily. While taking strong pain meds to prevent constipation. 07/06/23   Loletta Parish., MD  sertraline (ZOLOFT) 50 MG tablet Take 50 mg by mouth every morning.    [provider]  traZODone (DESYREL) 150 MG tablet Take 150 mg by mouth at bedtime. 08/24/20   [provider]    ___________________________________________________________________________________________________ Physical Exam:    08/02/2023    6:35 PM 08/02/2023    4:20 PM 08/02/2023    4:14 PM  Vitals with BMI  Height    5\' 3"   Weight   169 lbs  BMI   29.94  Systolic 127 146   Diastolic 51 57   Pulse 71 65      1. General:  in No  Acute distress    Chronically ill   -appearing 2. Psychological: Alert and   Oriented 3. Head/ENT:   Dry Mucous Membranes                          Head Non traumatic, neck supple                          Poor Dentition 4. SKIN:  decreased Skin turgor,  Skin clean Dry and intact no rash    5. Heart: Regular rate and rhythm no  Murmur, no Rub or gallop 6. Lungs: no wheezes or crackles   7. Abdomen: Soft,  non-tender, Non distended  bowel sounds present 8. Lower extremities: no clubbing, cyanosis, no  edema 9. Neurologically Grossly intact, moving all 4 extremities equally  10. MSK: Normal range of motion    Chart has been reviewed  ______________________________________________________________________________________________  Assessment/Plan  77 y.o. female with medical history significant of CAD, bladder cancer, anemia,   Admitted for   Acute kidney injury in the setting of CKD, and Anemia     Present on Admission:  AKI (acute kidney injury) (HCC)  Symptomatic anemia  Essential hypertension  Mixed hyperlipidemia  Chronic combined systolic and diastolic CHF (congestive heart failure) (HCC)  Urothelial carcinoma (HCC)  CAD in native artery  Hyponatremia  Hypokalemia  UTI (urinary tract infection)     Symptomatic anemia Hemoglobin drifted down to 9.2.  Obtain anemia panel Hemoccult negative suspect possible source being hematuria. Transfuse if needed for hemoglobin approaching 7  Essential hypertension Continue Coreg 3.125 mg twice daily if blood pressure allows continue Imdur 15 mg by mouth daily  Mixed hyperlipidemia Continue Crestor 40 mg p.o. daily  Chronic combined systolic and diastolic CHF (congestive heart failure) (HCC) Currently appears to be on a dry side.  Evidence of AKI obtain electrolytes  Urothelial carcinoma (HCC) Followed by  urology status post ureteral stent placement.  Will obtain CT to further evaluate to make sure it is functioning and there is no evidence of obstruction CT showing stable stent UA showing yeast and rare bacteria ER started on meropenem given hx of prior resitent UTI in the past Urine cult ure pending Urology consulted will see patient in a.m.  CAD in native artery Chronic stable hold aspirin given hematuria continue Coreg 6.25 mg p.o. twice daily as well as Crestor 40 mg daily  Hyponatremia Obtain TSH chest x-ray urine electrolytes gently rehydrate and follow fluid status  AKI (acute kidney injury) (HCC) Most likely secondary to dehydration patient have had decreased p.o. input Order strict I's and O's rehydrate aggressively follow renal function Obtain urine electrolytes If no improvement may need nephrology consult. CT showing stent in good position   Hypokalemia Was given a dose or Kdur in ER  UTI (urinary tract infection) Possible UTI given complex urological history we will continue with meropenem for tonight Noted yeast in urine discussed with urology who feels that this is chronic no need to treat for tonight Await results of urine cultures   Other plan as per orders.  DVT prophylaxis:  SCD     Code Status:    Code Status: Prior FULL CODE  as per patient   I had personally discussed CODE STATUS with patient and family  ACP   none   Family Communication:   Family   at  Bedside  plan of care was discussed   with Husband,    Diet  heart healthy   Disposition Plan:        To home once workup is complete and patient is stable   Following barriers for discharge:  Electrolytes corrected                               Anemia corrected h/H stable                                                        Will need consultants to evaluate patient prior to discharge       Consult Orders  (From admission, onward)            Start     Ordered   08/02/23 1823  Consult to hospitalist  Once       Provider:  (Not yet assigned)  Question Answer Comment  Place call to: [facility]   Reason for Consult Admit      08/02/23 1822                               Would benefit from PT/OT eval prior to DC  Ordered                     Consults called: Urology is aware Dr. Jennette Dubin   Admission status:  ED Disposition     ED Disposition  Admit   Condition  --   Comment  Hospital Area: [facility] [[id number]]  Level of Care: Progressive [102]  Admit to Progressive based on following criteria: NEPHROLOGY stable condition requiring close monitoring for AKI, requiring Hemodialysis or Peritoneal Dialysis either from expected electrolyte imbalance, acidosis, or fluid overload that can be managed by NIPPV or high flow oxygen.  May admit patient to [facility] or [facility] if equivalent level of care is available:: No  Covid Evaluation: Asymptomatic - no recent exposure (last 10 days) testing not required  Diagnosis: AKI (acute kidney injury) [facility]) [[id number]]  Admitting Physician: Therisa Doyne [[id number]]  Attending Physician: Therisa Doyne [[id number]]  Certification:: I certify this patient will need inpatient services for at least 2 midnights  Expected Medical Readiness: 08/04/2023               inpatient     I Expect 2 midnight stay secondary to severity of patient's current illness need for inpatient interventions justified by the following:     Severe lab/radiological/exam abnormalities including:   AKI  and extensive comorbidities including:   CHF   CAD  CKD   malignancy,   That are currently affecting medical management.   I expect  patient to be hospitalized for 2 midnights requiring inpatient medical care.  Patient is at high risk for adverse outcome (such as loss of life or disability) if not treated.  Indication for inpatient stay as follows:   Need for  operative/procedural  intervention  Need for rehydration    Need for IV fluids,     Level of car    progressive     tele indefinitely please discontinue once patient no longer qualifies COVID-19 Labs   Midori Dado 08/02/2023, 10:31 PM    [facility]     after 2 AM please page floor coverage PA If 7AM-7PM, please contact the day team taking care of the patient using [email or web address]

## 2023-08-02 NOTE — Progress Notes (Signed)
A consult was received from an ED physician for meropenem per pharmacy dosing.  The patient's profile has been reviewed for ht/wt/allergies/indication/available labs.    A one time order has been placed for meropenem 1 gm IV.    Further antibiotics/pharmacy consults should be ordered by admitting physician if indicated.                       Thank you, Herby Abraham, Pharm.D Use secure chat for questions 08/02/2023 8:42 PM

## 2023-08-02 NOTE — Progress Notes (Signed)
Pharmacy Antibiotic Note  [name] is a 77 y.o. female admitted on 08/02/2023 with UTI.  Pharmacy has been consulted for meropenem dosing.  Meropenem 1gm IV x 1 ordered in the ED  Plan: Meropenem 500mg  IV q24h Follow renal function F/u culture results and sensitivities  Height: 5\' 3"  (160 cm) Weight: 76.7 kg (169 lb) IBW/kg (Calculated) : 52.4  Temp (24hrs), Avg:98.6 F (37 C), Min:98 F (36.7 C), Max:99 F (37.2 C)  Recent Labs  Lab 08/02/23 1633  WBC 13.6*  CREATININE 6.13*    Estimated Creatinine Clearance: 7.8 mL/min (A) (by C-G formula based on SCr of 6.13 mg/dL (H)).    Allergies  Allergen Reactions   Erythromycin Diarrhea and Nausea And Vomiting   Lotensin [Benazepril Hcl] Cough   Macrodantin [Nitrofurantoin] Diarrhea    And night sweats    Antimicrobials this admission: 10/24 Meropenem >>    Dose adjustments this admission:    Microbiology results: 10/24 UCx:       Thank you for allowing pharmacy to be a part of this patient's care.  Maryellen Pile, PharmD 08/02/2023 10:38 PM

## 2023-08-03 DIAGNOSIS — N179 Acute kidney failure, unspecified: Secondary | ICD-10-CM | POA: Diagnosis not present

## 2023-08-03 LAB — RETICULOCYTES
Immature Retic Fract: 10.5 % (ref 2.3–15.9)
RBC.: 3.02 MIL/uL — ABNORMAL LOW (ref 3.87–5.11)
Retic Count, Absolute: 23.6 10*3/uL (ref 19.0–186.0)
Retic Ct Pct: 0.8 % (ref 0.4–3.1)

## 2023-08-03 LAB — COMPREHENSIVE METABOLIC PANEL
ALT: 11 U/L (ref 0–44)
AST: 11 U/L — ABNORMAL LOW (ref 15–41)
Albumin: 2.6 g/dL — ABNORMAL LOW (ref 3.5–5.0)
Alkaline Phosphatase: 56 U/L (ref 38–126)
Anion gap: 9 (ref 5–15)
BUN: 53 mg/dL — ABNORMAL HIGH (ref 8–23)
CO2: 19 mmol/L — ABNORMAL LOW (ref 22–32)
Calcium: 8.2 mg/dL — ABNORMAL LOW (ref 8.9–10.3)
Chloride: 105 mmol/L (ref 98–111)
Creatinine, Ser: 5.81 mg/dL — ABNORMAL HIGH (ref 0.44–1.00)
GFR, Estimated: 7 mL/min — ABNORMAL LOW (ref >60)
Glucose, Bld: 109 mg/dL — ABNORMAL HIGH (ref 70–99)
Potassium: 3.8 mmol/L (ref 3.5–5.1)
Sodium: 133 mmol/L — ABNORMAL LOW (ref 135–145)
Total Bilirubin: 0.4 mg/dL (ref 0.3–1.2)
Total Protein: 7.2 g/dL (ref 6.5–8.1)

## 2023-08-03 LAB — OSMOLALITY: Osmolality: 305 mosm/kg — ABNORMAL HIGH (ref 275–295)

## 2023-08-03 LAB — URINE CULTURE: Culture: 70000 — AB

## 2023-08-03 LAB — CK: Total CK: 33 U/L — ABNORMAL LOW (ref 38–234)

## 2023-08-03 LAB — CBC
HCT: 27.7 % — ABNORMAL LOW (ref 36.0–46.0)
Hemoglobin: 8.8 g/dL — ABNORMAL LOW (ref 12.0–15.0)
MCH: 29.3 pg (ref 26.0–34.0)
MCHC: 31.8 g/dL (ref 30.0–36.0)
MCV: 92.3 fL (ref 80.0–100.0)
Platelets: 229 10*3/uL (ref 150–400)
RBC: 3 MIL/uL — ABNORMAL LOW (ref 3.87–5.11)
RDW: 15 % (ref 11.5–15.5)
WBC: 12.6 10*3/uL — ABNORMAL HIGH (ref 4.0–10.5)
nRBC: 0 % (ref 0.0–0.2)

## 2023-08-03 LAB — IRON AND TIBC
Iron: 13 ug/dL — ABNORMAL LOW (ref 28–170)
Saturation Ratios: 7 % — ABNORMAL LOW (ref 10.4–31.8)
TIBC: 197 ug/dL — ABNORMAL LOW (ref 250–450)
UIBC: 184 ug/dL

## 2023-08-03 LAB — PREALBUMIN: Prealbumin: 14 mg/dL — ABNORMAL LOW (ref 18–38)

## 2023-08-03 LAB — MAGNESIUM: Magnesium: 1.8 mg/dL (ref 1.7–2.4)

## 2023-08-03 LAB — VITAMIN B12: Vitamin B-12: 299 pg/mL (ref 180–914)

## 2023-08-03 LAB — FOLATE: Folate: 8 ng/mL (ref >5.9)

## 2023-08-03 LAB — PHOSPHORUS: Phosphorus: 4.1 mg/dL (ref 2.5–4.6)

## 2023-08-03 LAB — FERRITIN: Ferritin: 425 ng/mL — ABNORMAL HIGH (ref 11–307)

## 2023-08-03 LAB — PROCALCITONIN: Procalcitonin: 0.16 ng/mL

## 2023-08-03 MED ORDER — SODIUM CHLORIDE 0.9 % IV SOLN: INTRAVENOUS | Status: DC

## 2023-08-03 MED ORDER — SODIUM CHLORIDE 0.9 % IV SOLN
1.0000 g | INTRAVENOUS | Status: DC
  Administered 2023-08-03 – 2023-08-05 (×3): 1 g via INTRAVENOUS
  Filled 2023-08-03 (×3): qty 10

## 2023-08-03 NOTE — Hospital Course (Addendum)
77 y.o. female with medical history significant of CAD, bladder cancer, anemia, diastolic CHF, hx of PE 2022, CKD IV b/l creat ~2.6 on 06/18/23.presented with fatigue, hematuria, abnormal labs and was sent in the emergency department hematuria was seen by her primary care provider who noticed that she has acute renal failure with creatinine up to 5 up from baseline of 2 and progressive anemia with hemoglobin down to 9.7 down from 11 at baseline. Had a few episodes of nonbloody diarrhea last week Hemoccult negative in ER Not on any anticoagulation does has history of bladder cancer followed by urology Dr. Berneice Heinrich on 27 September had cystoscopy of left retrograde pyelogram and left ureteroscopy with laser ablation of the tumor with left ureteral stent placement. She was admitted for acute kidney injury in the setting of CKD, and Anemia . CXR: NAD CT Abd/pelvis: Left ureteral stent in expected location with persistent perinephric stranding, no hydronephrosis. Left para-aortic retroperitoneal adenopathy, increased since previous

## 2023-08-03 NOTE — Progress Notes (Signed)
PROGRESS NOTE HAZELY BREUNINGER  ZOX:[id number] DOB: May 09, 1946 DOA: 08/02/2023 PCP: Shon Hale, MD  Brief Narrative/Hospital Course: 77 y.o. female with medical history significant of CAD, bladder cancer, anemia, diastolic CHF, hx of PE 2022, CKD IV b/l creat ~2.6 on 06/18/23.presented with fatigue, hematuria, abnormal labs and was sent in the emergency department hematuria was seen by her primary care provider who noticed that she has acute renal failure with creatinine up to 5 up from baseline of 2 and progressive anemia with hemoglobin down to 9.7 down from 11 at baseline. Had a few episodes of nonbloody diarrhea last week Hemoccult negative in ER Not on any anticoagulation does has history of bladder cancer followed by urology Dr. Berneice Heinrich on 27 September had cystoscopy of left retrograde pyelogram and left ureteroscopy with laser ablation of the tumor with left ureteral stent placement. She was admitted for acute kidney injury in the setting of CKD, and Anemia . CXR: NAD CT Abd/pelvis: Left ureteral stent in expected location with persistent perinephric stranding, no hydronephrosis. Left para-aortic retroperitoneal adenopathy, increased since previous    Subjective: SEEN THIS AM HAD NAUSEAS VOMITING  X 1 THIS SM FEELS BETTER NO BLOOD IN STOOL OR URINE Last BM Wednesday when it was loose VOIDED THIS AM AND WAS CLEAR   Assessment and Plan: Active Problems:   Symptomatic anemia   Essential hypertension   Mixed hyperlipidemia   Chronic combined systolic and diastolic CHF (congestive heart failure) (HCC)   Urothelial carcinoma (HCC)   Hypokalemia   CAD in native artery   AKI (acute kidney injury) (HCC)   Hyponatremia   UTI (urinary tract infection)   Normocytic anemia: Anemia panel reviewed shows ferritin elevated iron serum low at 13 B12 borderline to 99 folate normal as below holding okay transfuse for less than 7 g.  Hemoccult negative no evidence of acute bleeding, add b12  and oral iron supplement whne po better.  Reports urine is clear, but UA-WBC more than 50 RBC more than 50 large hemoglobin-  likely hematuria contributed , monitor Recent Labs    12/04/22 0949 02/12/23 1512 06/18/23 1333 06/18/23 1334 08/02/23 1633 08/03/23 0400  HGB 12.6 12.7 11.4*  --  9.2* 8.8*  MCV  --  96.3 91.5  --  91.6 92.3  VITAMINB12  --  302 275  --   --  299  FOLATE  --   --   --   --   --  8.0  FERRITIN  --  156  --  224  --  425*  TIBC  --   --   --   --   --  197*  IRON  --   --   --   --   --  13*  RETICCTPCT  --   --   --   --   --  0.8   Essential hypertension: Well-controlled on Coreg Imdur  HLD: Continue her statin . Ck stable    Chronic combined systolic and diastolic CHF: Appears on the dry side we will keep on IV fluid hydration. Check bnp  Urothelial carcinoma ? UTI POA ShE is f/b urology,  s/p ureteral stent placement.CT>Left ureteral stent in expected location with persistent perinephric stranding, no hydronephrosis. Left para-aortic retroperitoneal adenopathy, increased since previous  UA showing yeast and rare bacteria-started on meropenem due to prior resistant UTI follow-up urine culture continue antibiotics until then.  Urology was consulted   CAD in native artery Holding aspirin for now no  chest pain.  Continue her Coreg and Crestor    Hyponatremia Resolving.  Keep on IV fluid hydration    AKI on CKD IV: b/l creat ~2.6 on 06/18/23.  Likely in the setting of poor oral intake dehydration, creatinine slightly better will keep on IV fluid hydration nephrology has been consulted, CT showed stable stent.  Monitor intake output , monitor renal function    Hypokalemia Resolved.    DVT prophylaxis: SCDs Start: 08/02/23 2137 Code Status:   Code Status: Full Code Family Communication: plan of care discussed with patient at bedside. Patient status is: Inpatient because of AKI Level of care: Progressive   Dispo: The patient is from: home with  husband            Anticipated disposition: TBD Objective: Vitals last 24 hrs: Vitals:   08/02/23 2100 08/02/23 2149 08/03/23 0145 08/03/23 0547  BP: 126/85 124/62 (!) 159/64 (!) 138/50  Pulse: 66 65 66 75  Resp: (!) 21  16 15   Temp:  98.9 F (37.2 C) 99.6 F (37.6 C) 97.7 F (36.5 C)  TempSrc:  Oral Oral Oral  SpO2: 97% 97% 98% 97%  Weight:      Height:       Weight change:   Physical Examination: General exam: alert awake, older than stated age HEENT:Oral mucosa DRY, Ear/Nose WNL grossly Respiratory system: bilaterally CLEAR BS,no use of accessory muscle Cardiovascular system: S1 & S2 +, No JVD. Gastrointestinal system: Abdomen soft,NT,ND, BS+ Nervous System:Alert, awake, moving extremities. Extremities: LE edema neg,distal peripheral pulses palpable.  Skin: No rashes,no icterus. MSK: Normal muscle bulk,tone, power  Medications reviewed:  Scheduled Meds:  carvedilol  3.125 mg Oral BID WC   isosorbide mononitrate  15 mg Oral Daily   rosuvastatin  40 mg Oral Daily   sertraline  50 mg Oral q morning   traZODone  150 mg Oral QHS   Continuous Infusions:  meropenem (MERREM) IV      Diet Order             Diet Heart Room service appropriate? Yes; Fluid consistency: Thin  Diet effective now                  Intake/Output Summary (Last 24 hours) at 08/03/2023 0906 Last data filed at 08/03/2023 0500 Gross per 24 hour  Intake 1840.55 ml  Output --  Net 1840.55 ml   Net IO Since Admission: 1,840.55 mL [08/03/23 0906]  Wt Readings from Last 3 Encounters:  08/02/23 76.7 kg  07/06/23 76.7 kg  07/02/23 76.7 kg     Unresulted Labs (From admission, onward)     Start     Ordered   08/03/23 0500  Prealbumin  Tomorrow morning,   R        08/02/23 1935   08/02/23 1936  Urinalysis, Complete w Microscopic -Urine, Clean Catch  Once,   URGENT       Question Answer Comment  Release to patient Immediate   Specimen Source Urine, Clean Catch      08/02/23 1935    08/02/23 1804  Urine Culture  Once,   AD        08/02/23 1804          Data Reviewed: I have personally reviewed following labs and imaging studies CBC: Recent Labs  Lab 08/02/23 1633 08/03/23 0400  WBC 13.6* 12.6*  NEUTROABS 9.9*  --   HGB 9.2* 8.8*  HCT 29.4* 27.7*  MCV 91.6 92.3  PLT 254  229   Basic Metabolic Panel: Recent Labs  Lab 08/02/23 1633 08/02/23 2233 08/03/23 0400  NA 131* 128* 133*  K 3.1* 3.7 3.8  CL 101 102 105  CO2 19* 18* 19*  GLUCOSE 113* 107* 109*  BUN 56* 54* 53*  CREATININE 6.13* 6.00* 5.81*  CALCIUM 8.2* 7.9* 8.2*  MG  --   --  1.8  PHOS  --   --  4.1  GFR: Estimated Creatinine Clearance: 8.2 mL/min (A) (by C-G formula based on SCr of 5.81 mg/dL (H)). Liver Function Tests: Recent Labs  Lab 08/02/23 1633 08/03/23 0400  AST 10* 11*  ALT 11 11  ALKPHOS 62 56  BILITOT 0.6 0.4  PROT 8.0 7.2  ALBUMIN 2.8* 2.6*  No results for input(s): "LIPASE", "AMYLASE" in the last 168 hours. No results for input(s): "AMMONIA" in the last 168 hours. Coagulation Profile: Recent Labs  Lab 08/02/23 1958  INR 1.3*   Recent Labs  Lab 08/03/23 0400  PROCALCITON 0.16    No results found for this or any previous visit (from the past 240 hour(s)).  Antimicrobials: Anti-infectives (From admission, onward)    Start     Dose/Rate Route Frequency Ordered Stop   08/03/23 2359  meropenem (MERREM) 500 mg in sodium chloride 0.9 % 100 mL IVPB        500 mg 200 mL/hr over 30 Minutes Intravenous Every 24 hours 08/02/23 2307     08/02/23 2100  meropenem (MERREM) 1 g in sodium chloride 0.9 % 100 mL IVPB        1 g 200 mL/hr over 30 Minutes Intravenous  Once 08/02/23 2040 08/03/23 0013   08/02/23 2030  ertapenem (INVANZ) 500 mg in sodium chloride 0.9 % 50 mL IVPB  Status:  Discontinued        500 mg 100 mL/hr over 30 Minutes Intravenous Every 24 hours 08/02/23 2026 08/02/23 2039      Culture/Microbiology    Component Value Date/Time   SDES  11/16/2022  0805    URINE, RANDOM CYTOSCOPE Performed at [facility], [address] [place], [place] [address]    SPECREQUEST  11/16/2022 0805    NONE Performed at [facility], [address], [place], [place] [address]    CULT  11/16/2022 0805    NO GROWTH Performed at [facility], [address] [place], [place] [address]    REPTSTATUS 11/17/2022 FINAL 11/16/2022 0805    Radiology Studies: CT ABDOMEN PELVIS WO CONTRAST  Result Date: 08/02/2023 CLINICAL DATA:  Evaluate patency of the ureteral stent EXAM: CT ABDOMEN AND PELVIS WITHOUT CONTRAST TECHNIQUE: Multidetector CT imaging of the abdomen and pelvis was performed following the standard protocol without IV contrast. RADIATION DOSE REDUCTION: This exam was performed according to the departmental dose-optimization program which includes automated exposure control, adjustment of the mA and/or kV according to patient size and/or use of iterative reconstruction technique. COMPARISON:  04/21/2022 FINDINGS: Lower chest: No pleural or pericardial effusion. Coronary and aortic calcified plaque. Hepatobiliary: No focal liver abnormality is seen. No gallstones, gallbladder wall thickening, or biliary dilatation. Pancreas: Unremarkable. No pancreatic ductal dilatation or surrounding inflammatory changes. Spleen: Normal in size without focal abnormality. Adrenals/Urinary Tract: No adrenal mass. Right kidney absent. Normal left renal contour. Double-J left ureteral stent in expected location with only mild distension of the lower pole left renal collecting system, stable since previous. Mild inflammatory/edematous changes in the perinephric fat. Urinary bladder incompletely distended. Stomach/Bowel: Small hiatal  hernia. Stomach nondistended. Small bowel decompressed. Appendix not identified. Colon is partially distended, with a few scattered descending diverticula; no adjacent inflammatory change. Vascular/Lymphatic:  Scattered heavy aortoiliac atheromatous calcifications without aneurysm. Left para-aortic retroperitoneal adenopathy, largest 1.5 cm (Im44,Se2) , previously 0.7 cm. A few subcentimeter aortocaval nodes. No pelvic or mesenteric adenopathy. Reproductive: Status post hysterectomy. No adnexal masses. Other: Bilateral pelvic phleboliths.  No ascites.  No free air. Musculoskeletal: Multilevel thoracolumbar spondylitic change. IMPRESSION: 1. Left ureteral stent in expected location with persistent perinephric stranding, no hydronephrosis. 2. Left para-aortic retroperitoneal adenopathy, increased since previous. 3.  Aortic Atherosclerosis (ICD10-I70.0). Electronically Signed   By: Corlis Leak M.D.   On: 08/02/2023 20:03   DG Chest 2 View  Result Date: 08/02/2023 CLINICAL DATA:  fatigue, leukocytosis, hematuria EXAM: CHEST - 2 VIEW COMPARISON:  06/05/2022 FINDINGS: Lungs are clear. Heart size and mediastinal contours are within normal limits. Aortic Atherosclerosis (ICD10-170.0). No effusion. Visualized bones unremarkable. IMPRESSION: No acute cardiopulmonary disease. Electronically Signed   By: Corlis Leak M.D.   On: 08/02/2023 19:54     LOS: 1 day   Lanae Boast, MD [facility]  08/03/2023, 9:06 AM

## 2023-08-03 NOTE — Consult Note (Signed)
Urology Consult Note   Requesting Attending Physician:  Lanae Boast, MD Service Providing Consult: Urology  Consulting Attending: Sebastian Ache MD  Reason for Consult:  AKI  HPI: [name] is seen in consultation for reasons noted above at the request of Lanae Boast, MD for evaluation of AKI  Pt is 60 YOF w/ h/o CAD, CKD, depression, GERD, and longstanding high-grade bladder upper tract cancer status post right nephroureterectomy (2008), and ongoing left high-grade renal pelvis cancer since 2020 with most recent tumor ablation and stent placement back in September 2024 who is currently admitted with ongoing AKI.  Patient called [facility] back on 08/01/2023 and reports not feeling generally well since surgery.  She states that her appetite has been low, has not eaten well.  She reported no fevers or chills at that time.  She presented to the emergency department yesterday evening with significant fatigue, found to have acute renal failure with creatinine of 6.0, which appears to be elevated above her baseline of approximately 2.3.  Her hemoglobin is also marginally lower, 9.2 in the ED, down from approximately 11.5 prior to surgery.  Upon seeing the patient, she denies seeing any hematuria.  However, in speaking with the ED provider last night, it sounds like according to family, has had intermittent hematuria since her stent placement.   Past Medical History: Past Medical History:  Diagnosis Date   Anemia associated with chronic renal failure    CAD in native artery 10/2019   cardiologist-   dr Judie Petit. Izora Ribas;   a. 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   urologist--- dr dahlstedt/  oncologist--- dr Clelia Croft;   dx 09/ 2020 high grade urothelial  s/p laser ablation, ;  recurrent s/p BCG instillation,  completed chemo instilation 11/ 2022  and repeat chemo completed  05/ 2023   Chronic combined systolic and diastolic CHF (congestive heart failure) (HCC) 06/2019   followed by cardiology;   dx 09/ 2022 in setting of sepsis, pulm edema;   05/ 2022  ef 40-45% per echo,  recovered per cath 01/ 2021 ef 50%;  laste echo 08/ 2023  ef 60-65%   CKD (chronic kidney disease), stage III (HCC)    Depression    GAD (generalized anxiety disorder)    GERD (gastroesophageal reflux disease)    History of bladder cancer 12/2006   followed by dr Retta Diones   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   dx 04/ 2008  w/ poor function hydronephrotic  right kidney;   02-06-2007  s/p right nephroureterectomy   History of pulmonary embolism 04/2022   in setting severe sepsis;  small RLL, treated w/ 3 months eliquis   Hyperlipidemia, mixed    Hypertension    Solitary kidney, acquired 02/06/2007   s/p  right nephroureterectomy for cancer    Past Surgical History:  Past Surgical History:  Procedure Laterality Date   CATARACT EXTRACTION W/ INTRAOCULAR LENS IMPLANT Bilateral 2011   CYSTOSCOPY W/ URETERAL STENT PLACEMENT Left 11/17/2019   Procedure: CYSTOSCOPY WITH STENT REPLACEMENT;  Surgeon: Marcine Matar, MD;  Location: [facility];  Service: Urology;  Laterality: Left;   CYSTOSCOPY W/ URETERAL STENT PLACEMENT Left 03/23/2022   Procedure: CYSTOSCOPY WITH RETROGRADE PYELOGRAM, LEFT URETERAL STENT PLACEMENT;  Surgeon: Marcine Matar, MD;  Location[facility];  Service: Urology;  Laterality: Left;   CYSTOSCOPY W/ URETERAL STENT PLACEMENT Left  06/09/2022   Procedure: CYSTOSCOPY WITH LEFT STENT EXCHANGE;  Surgeon: Marcine Matar, MD;  Location: [facility];  Service: Urology;  Laterality: Left;   CYSTOSCOPY W/ URETERAL STENT PLACEMENT Left 12/04/2022   Procedure: CYSTOSCOPY WITH STENT REPLACEMENT;  Surgeon: Marcine Matar, MD;  Location: [facility];  Service: Urology;  Laterality: Left;   CYSTOSCOPY W/ URETERAL STENT REMOVAL Left  05/10/2020   Procedure: CYSTOSCOPY WITH STENT REMOVAL;  Surgeon: Marcine Matar, MD;  Location: [facility];  Service: Urology;  Laterality: Left;   CYSTOSCOPY W/ URETERAL STENT REMOVAL Left 05/02/2021   Procedure: CYSTOSCOPY WITH STENT REMOVAL;  Surgeon: Marcine Matar, MD;  Location: [facility];  Service: Urology;  Laterality: Left;   CYSTOSCOPY W/ URETERAL STENT REMOVAL Left 11/16/2022   Procedure: CYSTOSCOPY WITH STENT REMOVAL;  Surgeon: Marcine Matar, MD;  Location: [facility];  Service: Urology;  Laterality: Left;   CYSTOSCOPY WITH RETROGRADE PYELOGRAM, URETEROSCOPY AND STENT PLACEMENT Left 06/27/2019   Procedure: CYSTOSCOPY WITH LEFT RETROGRADE PYELOGRAM, URETEROSCOPY, BIOPSY AND LEFT STENT PLACEMENT;  Surgeon: Bjorn Pippin, MD;  Location: [facility];  Service: Urology;  Laterality: Left;   CYSTOSCOPY WITH RETROGRADE PYELOGRAM, URETEROSCOPY AND STENT PLACEMENT Left 08/18/2019   Procedure: CYSTOSCOPY, URETEROSCOPY AND STENT EXCHANGE;  Surgeon: Marcine Matar, MD;  Location: [facility];  Service: Urology;  Laterality: Left;  90 MINS   CYSTOSCOPY WITH RETROGRADE PYELOGRAM, URETEROSCOPY AND STENT PLACEMENT Left 11/17/2019   Procedure: CYSTOSCOPY WITH RETROGRADE PYELOGRAM, URETEROSCOPY AND STENT PLACEMENT;  Surgeon: Marcine Matar, MD;  Location: [facility];  Service: Urology;  Laterality: Left;  90 MINS   CYSTOSCOPY WITH RETROGRADE PYELOGRAM, URETEROSCOPY AND STENT PLACEMENT Left 05/10/2020   Procedure: CYSTOSCOPY WITH RETROGRADE PYELOGRAM, URETEROSCOPY AND STENT PLACEMENT WITH URETHRAL DIALATION AND BRUSH BIOPSY;  Surgeon: Marcine Matar, MD;  Location: [facility];  Service: Urology;  Laterality: Left;  1 HR   CYSTOSCOPY WITH RETROGRADE PYELOGRAM, URETEROSCOPY AND STENT PLACEMENT Left 09/16/2020   Procedure: CYSTOSCOPY WITH RETROGRADE PYELOGRAM, URETEROSCOPY ,  LITHOPAXY, LEFT URETERAL BRUSHING, AND STENT  REPLACEMENT;  Surgeon: Marcine Matar, MD;  Location: [facility];  Service: Urology;  Laterality: Left;   CYSTOSCOPY WITH RETROGRADE PYELOGRAM, URETEROSCOPY AND STENT PLACEMENT Left 10/24/2021   Procedure: CYSTOSCOPY WITH RETROGRADE PYELOGRAM, URETEROSCOPY, POSSIBLE URETERAL AND RENAL BIOPSIES AND STENT PLACEMENT;  Surgeon: Marcine Matar, MD;  Location: [facility];  Service: Urology;  Laterality: Left;  1 HR   CYSTOSCOPY WITH RETROGRADE PYELOGRAM, URETEROSCOPY AND STENT PLACEMENT Left 03/09/2022   Procedure: CYSTOSCOPY WITH ANTEGRADE PYELOGRAM,  STENT REMOVAL;  Surgeon: Marcine Matar, MD;  Location: [facility];  Service: Urology;  Laterality: Left;   CYSTOSCOPY WITH RETROGRADE PYELOGRAM, URETEROSCOPY AND STENT PLACEMENT  01/09/2007   @[facility]  by dr Retta Diones;    bx's/ washing  bladder , right ureter   CYSTOSCOPY WITH RETROGRADE PYELOGRAM, URETEROSCOPY AND STENT PLACEMENT Left 11/16/2022   Procedure: CYSTOSCOPY WITH RETROGRADE PYELOGRAM, URETEROSCOPY AND STENT REPLACEMENT;  Surgeon: Marcine Matar, MD;  Location: [facility];  Service: Urology;  Laterality: Left;  90 MINS   CYSTOSCOPY WITH RETROGRADE PYELOGRAM, URETEROSCOPY AND STENT PLACEMENT Left 07/06/2023   Procedure: CYSTOSCOPY WITH LEFT RETROGRADE PYELOGRAM, URETEROSCOPY, LASER ABLATION OF TUMOR AND STENT EXCHANGE;  Surgeon: Loletta Parish., MD;  Location: [facility];  Service: Urology;  Laterality: Left;  60 MINUTES NEEDED FOR CASE   CYSTOSCOPY/URETEROSCOPY/HOLMIUM LASER Left 12/04/2022   Procedure: CYSTOSCOPY/URETEROSCOPY/  HOLMIUM LASER OF RENAL PELVIC  LESIONS;  Surgeon: Marcine Matar, MD;  Location: [facility];  Service: Urology;  Laterality: Left;   CYSTOSCOPY/URETEROSCOPY/HOLMIUM LASER/STENT PLACEMENT Left 05/02/2021   Procedure: CYSTOSCOPY/URETEROSCOPY WITH BRUSH BIOPSY/ RETROGRADE PYELOGRAM/ HOLMIUM LASER/STENT REPLACEMENT;  Surgeon: Marcine Matar, MD;  Location: [facility];  Service: Urology;  Laterality: Left;   HOLMIUM LASER APPLICATION Left 09/16/2020   Procedure: HOLMIUM LASER APPLICATION;  Surgeon: Marcine Matar, MD;  Location: [facility];  Service: Urology;  Laterality: Left;   HOLMIUM LASER APPLICATION Left 10/24/2021   Procedure: HOLMIUM LASER APPLICATION OF TUMORS;  Surgeon: Marcine Matar, MD;  Location: [facility];  Service: Urology;  Laterality: Left;   HOLMIUM LASER APPLICATION Left 11/16/2022   Procedure: HOLMIUM LASER OF TUMORS;  Surgeon: Marcine Matar, MD;  Location: [facility];  Service: Urology;  Laterality: Left;   IR NEPHROSTOMY PLACEMENT LEFT  07/06/2021   IR NEPHROSTOMY PLACEMENT LEFT  02/06/2022   NEPHROSTOMY TUBE REMOVAL Left 03/23/2022   Procedure: NEPHROSTOMY TUBE REMOVAL;  Surgeon: Marcine Matar, MD;  Location: [facility];  Service: Urology;  Laterality: Left;   NEPHROURETERECTOMY Right 02/06/2007   @[facility]  by dr Retta Diones;   Laparoscopic   RIGHT/LEFT HEART CATH AND CORONARY ANGIOGRAPHY N/A 11/06/2019   Procedure: RIGHT/LEFT HEART CATH AND CORONARY ANGIOGRAPHY;  Surgeon: Lyn Records, MD;  Location: [facility];  Service: Cardiovascular;  Laterality: N/A;   THULIUM LASER TURP (TRANSURETHRAL RESECTION OF PROSTATE) Left 08/18/2019   Procedure: THULIUM LASER ABLATION OF URETERAL TUMOR;  Surgeon: Marcine Matar, MD;  Location: [facility];  Service: Urology;  Laterality: Left;   THULIUM LASER TURP (TRANSURETHRAL RESECTION OF PROSTATE) Left 11/17/2019   Procedure: THULIUM LASER of URETERAL CANCER;  Surgeon: Marcine Matar, MD;  Location: [facility];  Service: Urology;  Laterality: Left;   TRANSURETHRAL RESECTION OF BLADDER TUMOR  12/12/2006   @[facility]  by dr Vonita Moss   VAGINAL HYSTERECTOMY  1980    Medication: Current Facility-Administered Medications  Medication Dose Route Frequency  Provider Last Rate Last Admin   0.9 %  sodium chloride infusion   Intravenous Continuous Kc, Ramesh, MD 100 mL/hr at 08/03/23 0959 New Bag at 08/03/23 0959   acetaminophen (TYLENOL) tablet 650 mg  650 mg Oral Q6H PRN Therisa Doyne, MD       Or   acetaminophen (TYLENOL) suppository 650 mg  650 mg Rectal Q6H PRN Doutova, Anastassia, MD       carvedilol (COREG) tablet 3.125 mg  3.125 mg Oral BID WC Doutova, Anastassia, MD   3.125 mg at 08/03/23 1000   HYDROcodone-acetaminophen (NORCO/VICODIN) 5-325 MG per tablet 1-2 tablet  1-2 tablet Oral Q4H PRN Therisa Doyne, MD       isosorbide mononitrate (IMDUR) 24 hr tablet 15 mg  15 mg Oral Daily Doutova, Anastassia, MD   15 mg at 08/03/23 1001   meropenem (MERREM) 500 mg in sodium chloride 0.9 % 100 mL IVPB  500 mg Intravenous Q24H Poindexter, Leann T, RPH       ondansetron (ZOFRAN) tablet 4 mg  4 mg Oral Q6H PRN Doutova, Anastassia, MD       Or   ondansetron (ZOFRAN) injection 4 mg  4 mg Intravenous Q6H PRN Doutova, Anastassia, MD       rosuvastatin (CRESTOR) tablet 40 mg  40 mg Oral Daily Doutova, Anastassia, MD   40 mg at 08/03/23 1000   sertraline (ZOLOFT) tablet 50 mg  50 mg Oral q morning Doutova,  Anastassia, MD   50 mg at 08/03/23 1000   traZODone (DESYREL) tablet 150 mg  150 mg Oral QHS Therisa Doyne, MD   150 mg at 08/02/23 2306    Allergies: Allergies  Allergen Reactions   Erythromycin Diarrhea and Nausea And Vomiting   Lotensin [Benazepril Hcl] Cough   Macrodantin [Nitrofurantoin] Diarrhea    And night sweats    Social History: Social History   Tobacco Use   Smoking status: Former    Current packs/day: 0.00    Average packs/day: 0.5 packs/day for 38.0 years (19.0 ttl pk-yrs)    Types: Cigarettes    Start date: 11/25/1965    Quit date: 11/26/2003    Years since quitting: 19.6   Smokeless tobacco: Never  Vaping Use   Vaping status: Never Used  Substance Use Topics   Alcohol use: Not Currently   Drug use: Never     Family History Family History  Problem Relation Age of Onset   Hypertension Mother    Diabetes Mellitus II Sister    Hypertension Sister    Hypertension Brother     Review of Systems 10 systems were reviewed and are negative except as noted specifically in the HPI.  Objective   Vital signs in last 24 hours: BP 127/61 (BP Location: Right Arm)   Pulse 66   Temp 98.2 F (36.8 C) (Oral)   Resp 20   Ht 5\' 3"  (1.6 m)   Wt 76.7 kg   SpO2 98%   BMI 29.94 kg/m   Physical Exam General: NAD, A&O, resting, appropriate HEENT: [place]/AT, EOMI, MMM Pulmonary: Normal work of breathing Cardiovascular: HDS, adequate peripheral perfusion Abdomen: Soft, NTTP, nondistended. GU: No CVA tenderness Extremities: warm and well perfused Neuro: Appropriate, no focal neurological deficits  Most Recent Labs: Lab Results  Component Value Date   WBC 12.6 (H) 08/03/2023   HGB 8.8 (L) 08/03/2023   HCT 27.7 (L) 08/03/2023   PLT 229 08/03/2023    Lab Results  Component Value Date   NA 133 (L) 08/03/2023   K 3.8 08/03/2023   CL 105 08/03/2023   CO2 19 (L) 08/03/2023   BUN 53 (H) 08/03/2023   CREATININE 5.81 (H) 08/03/2023   CALCIUM 8.2 (L) 08/03/2023   MG 1.8 08/03/2023   PHOS 4.1 08/03/2023    Lab Results  Component Value Date   INR 1.3 (H) 08/02/2023   APTT 31 04/21/2022     Urine Culture: @[id number] (laburin,org,[id number],[id number])@   IMAGING: CT ABDOMEN PELVIS WO CONTRAST  Result Date: 08/02/2023 CLINICAL DATA:  Evaluate patency of the ureteral stent EXAM: CT ABDOMEN AND PELVIS WITHOUT CONTRAST TECHNIQUE: Multidetector CT imaging of the abdomen and pelvis was performed following the standard protocol without IV contrast. RADIATION DOSE REDUCTION: This exam was performed according to the departmental dose-optimization program which includes automated exposure control, adjustment of the mA and/or kV according to patient size and/or use of iterative reconstruction technique.  COMPARISON:  04/21/2022 FINDINGS: Lower chest: No pleural or pericardial effusion. Coronary and aortic calcified plaque. Hepatobiliary: No focal liver abnormality is seen. No gallstones, gallbladder wall thickening, or biliary dilatation. Pancreas: Unremarkable. No pancreatic ductal dilatation or surrounding inflammatory changes. Spleen: Normal in size without focal abnormality. Adrenals/Urinary Tract: No adrenal mass. Right kidney absent. Normal left renal contour. Double-J left ureteral stent in expected location with only mild distension of the lower pole left renal collecting system, stable since previous. Mild inflammatory/edematous changes in the perinephric fat. Urinary bladder incompletely distended. Stomach/Bowel: Small hiatal hernia.  Stomach nondistended. Small bowel decompressed. Appendix not identified. Colon is partially distended, with a few scattered descending diverticula; no adjacent inflammatory change. Vascular/Lymphatic: Scattered heavy aortoiliac atheromatous calcifications without aneurysm. Left para-aortic retroperitoneal adenopathy, largest 1.5 cm (Im44,Se2) , previously 0.7 cm. A few subcentimeter aortocaval nodes. No pelvic or mesenteric adenopathy. Reproductive: Status post hysterectomy. No adnexal masses. Other: Bilateral pelvic phleboliths.  No ascites.  No free air. Musculoskeletal: Multilevel thoracolumbar spondylitic change. IMPRESSION: 1. Left ureteral stent in expected location with persistent perinephric stranding, no hydronephrosis. 2. Left para-aortic retroperitoneal adenopathy, increased since previous. 3.  Aortic Atherosclerosis (ICD10-I70.0). Electronically Signed   By: Corlis Leak M.D.   On: 08/02/2023 20:03   DG Chest 2 View  Result Date: 08/02/2023 CLINICAL DATA:  fatigue, leukocytosis, hematuria EXAM: CHEST - 2 VIEW COMPARISON:  06/05/2022 FINDINGS: Lungs are clear. Heart size and mediastinal contours are within normal limits. Aortic Atherosclerosis (ICD10-170.0). No  effusion. Visualized bones unremarkable. IMPRESSION: No acute cardiopulmonary disease. Electronically Signed   By: Corlis Leak M.D.   On: 08/02/2023 19:54    ------  Assessment:  Pt 77 YOF with history of longstanding high-grade bladder upper tract cancer status post right nephro ureterectomy 2008, and ongoing left high-grade renal pelvis cancer since 2020 with most recent tumor ablation and stent placement back in September 2024 who is currently admitted with ongoing AKI.  Reassuring review of CT imaging shows that the stent is appropriately placed.  It does not appear that there appears to be acute obstructive etiology of her AKI.  Her urinalysis is equivocal.  Reasonable to treat empirically until urine culture results.  Would not favor treating the yeast seen on UA, as this has been present and it is likely secondary to colonization and not active infection.  Reassuringly, with conservative measures on resuscitation, patient's creatinine is already trending downward.  In regards to questionable report of hematuria since and placement, it is normal to have hematuria in the setting of stent placement.  Would investigate other causes of anemia at this time, would not favor acute blood loss at this time.  She has follow-up scheduled with urology in the near future.  There is no other acute interventions indicated at this time from a urologic standpoint.   Recommendations: -Agree with resuscitation for AKI.  Favor prerenal etiology -Reasonable to treat empirically for UTI until full urine culture results -Stent appears in appropriate positioning, no urgent urologic intervention required -We will ensure patient has appropriately scheduled follow-up with urology in approximately 2 months  Thank you for this consult. Please contact the urology consult pager with any further questions/concerns.

## 2023-08-03 NOTE — Evaluation (Signed)
Occupational Therapy Evaluation Patient Details Name: [name] MRN: [id number] DOB: 14-May-1946 Today's Date: 08/03/2023   History of Present Illness [name] is a 77 yr old female admitted to the hospital after being seen by her PCP and found to be with acute renal failure, with further reports of fatigue and hematuria. She was found to have AKI and anemia. PMH: CAD, anemia, diastolic CHF, PE in 2022, CA of L renal pelvis and ureter   Clinical Impression   The pt performed all assessed tasks with approximately supervision, including lower body dressing/donning socks seated EOB, sit to stand, toileting at bathroom level, and ambulating in the hall using a RW. She does not require further OT services in the acute care setting, however she would benefit from daily out of bed activity with the nursing staff and by being seen by the mobility team during her hospital stay. OT will sign off and recommend she return home at discharge.        If plan is discharge home, recommend the following: Help with stairs or ramp for entrance;Assistance with cooking/housework    Functional Status Assessment  Patient has not had a recent decline in their functional status  Equipment Recommendations  None recommended by OT    Recommendations for Other Services       Precautions / Restrictions Restrictions Weight Bearing Restrictions: No      Mobility Bed Mobility Overal bed mobility: Modified Independent Bed Mobility: Supine to Sit     Supine to sit: Modified independent (Device/Increase time)          Transfers Overall transfer level: Needs assistance   Transfers: Sit to/from Stand Sit to Stand: Supervision                  ADL either performed or assessed with clinical judgement   ADL Overall ADL's : Needs assistance/impaired Eating/Feeding: Independent;Sitting Eating/Feeding Details (indicate cue type and reason): based on clinical judgement Grooming: Set  up;Standing Grooming Details (indicate cue type and reason): at sink level         Upper Body Dressing : Set up;Sitting   Lower Body Dressing: Set up Lower Body Dressing Details (indicate cue type and reason): She donned her socks seated EOB. Toilet Transfer: Supervision/safety;Ambulation;Regular Teacher, adult education Details (indicate cue type and reason): She ambulated to and from the bathroom in her room. Toileting- Clothing Manipulation and Hygiene: Supervision/safety Toileting - Clothing Manipulation Details (indicate cue type and reason): Toileting tasks performed at bathroom level.              Pertinent Vitals/Pain Pain Assessment Pain Assessment: No/denies pain     Extremity/Trunk Assessment Upper Extremity Assessment Upper Extremity Assessment: Overall WFL for tasks assessed;Right hand dominant   Lower Extremity Assessment Lower Extremity Assessment: Overall WFL for tasks assessed       Communication Communication Communication: No apparent difficulties   Cognition Arousal: Alert Behavior During Therapy: WFL for tasks assessed/performed Overall Cognitive Status: Within Functional Limits for tasks assessed            General Comments: Oriented x4, able to follow commands without difficulty.     General Comments               Home Living Family/patient expects to be discharged to:: Private residence Living Arrangements: Spouse/significant other;Children (spouse and daughter) Available Help at Discharge: Family Type of Home: House Home Access: Stairs to enter [employer] of Steps: 5-6 Entrance Stairs-Rails: Right Home Layout: One level  Bathroom Shower/Tub: Runner, broadcasting/film/video: Shower seat - built Charity fundraiser (2 wheels);Cane - single point          Prior Functioning/Environment Prior Level of Function : Independent/Modified Independent;Driving             Mobility Comments: Rare use of RW  for ambulation. ADLs Comments: She was independent with ADLs, cooking, cleaning, and driving.             OT Treatment/Interventions:   No further treatment needs identified       OT Frequency:  N/A    Co-evaluation PT/OT/SLP Co-Evaluation/Treatment: Yes Reason for Co-Treatment: To address functional/ADL transfers;For patient/therapist safety PT goals addressed during session: Mobility/safety with mobility OT goals addressed during session: ADL's and self-care      AM-PAC OT "6 Clicks" Daily Activity     Outcome Measure Help from another person eating meals?: None Help from another person taking care of personal grooming?: None Help from another person toileting, which includes using toliet, bedpan, or urinal?: None Help from another person bathing (including washing, rinsing, drying)?: A Little Help from another person to put on and taking off regular upper body clothing?: None Help from another person to put on and taking off regular lower body clothing?: None 6 Click Score: 23   End of Session Equipment Utilized During Treatment: Gait belt;Rolling walker (2 wheels) Nurse Communication: Mobility status  Activity Tolerance: Patient tolerated treatment well Patient left: in chair;with call bell/phone within reach;with chair alarm set  OT Visit Diagnosis: Muscle weakness (generalized) (M62.81)                Time: 1610-9604 OT Time Calculation (min): 15 min Charges:  OT General Charges $OT Visit: 1 Visit OT Evaluation $OT Eval Low Complexity: 1 Low    Shatonya Passon L Billiejean Schimek, OTR/L 08/03/2023, 1:32 PM

## 2023-08-03 NOTE — Evaluation (Signed)
Physical Therapy Evaluation Patient Details Name: [name] MRN: [id number] DOB: 1946/09/13 Today's Date: 08/03/2023  History of Present Illness  Ms. Mantz is a 77 yr old female admitted to the hospital after being seen by her PCP and found to be with acute renal failure, with further reports of fatigue and hematuria. She was found to have AKI and anemia. 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 Then continued x 150' with a   RW, gait slow. Patient should progress to no device with further ambulation.  No further skilled PT recommended PT will sign off.      If plan is discharge home, recommend the following: A little help with bathing/dressing/bathroom;Help with stairs or ramp for entrance;Assistance with cooking/housework;Assist for transportation   Can travel by private vehicle        Equipment Recommendations None recommended by PT  Recommendations for Other Services       Functional Status Assessment Patient has not had a recent decline in their functional status     Precautions / Restrictions Precautions Precautions: Fall Restrictions Weight Bearing Restrictions: No      Mobility  Bed Mobility Overal bed mobility: Independent                  Transfers Overall transfer level: Needs assistance   Transfers: Sit to/from Stand Sit to Stand: Supervision                Ambulation/Gait Ambulation/Gait assistance: Supervision Gait Distance (Feet): 150 Feet Assistive device: None Gait Pattern/deviations: Step-through pattern, Drifts right/left Gait velocity: decr     General Gait Details: initially slow to ambulate  Stairs            Wheelchair Mobility     Tilt Bed    Modified Rankin (Stroke Patients Only)       Balance Overall balance assessment: Mild deficits observed, not formally tested                                            Pertinent Vitals/Pain Pain Assessment Pain Assessment: No/denies pain    Home Living Family/patient expects to be discharged to:: Private residence Living Arrangements: Spouse/significant other;Children Available Help at Discharge: Family Type of Home: House Home Access: Stairs to enter Entrance Stairs-Rails: Right Entrance Stairs-Number of Steps: 5-6   Home Layout: One level Home Equipment: Shower seat - built Charity fundraiser (2 wheels);Cane - single point      Prior Function Prior Level of Function : Independent/Modified Independent;Driving             Mobility Comments: Rare use of RW for ambulation. ADLs Comments: She was independent with ADLs, cooking, cleaning, and driving.     Extremity/Trunk Assessment   Upper Extremity Assessment Upper Extremity Assessment: Overall WFL for tasks assessed;Right hand dominant    Lower Extremity Assessment Lower Extremity Assessment: Overall WFL for tasks assessed    Cervical / Trunk Assessment Cervical / Trunk Assessment: Normal  Communication   Communication Communication: No apparent difficulties  Cognition Arousal: Alert Behavior During Therapy: WFL for tasks assessed/performed Overall Cognitive Status: Within Functional Limits for tasks assessed  General Comments: Oriented x4, able to follow commands without difficulty.        General Comments      Exercises     Assessment/Plan    PT Assessment Patient does not need any further PT services  PT Problem List         PT Treatment Interventions      PT Goals (Current goals can be found in the Care Plan section)  Acute Rehab PT Goals Patient Stated Goal: go home PT Goal Formulation: All assessment and education complete, DC therapy    Frequency       Co-evaluation PT/OT/SLP Co-Evaluation/Treatment: Yes Reason for Co-Treatment: To address  functional/ADL transfers;For patient/therapist safety PT goals addressed during session: Mobility/safety with mobility OT goals addressed during session: ADL's and self-care       AM-PAC PT "6 Clicks" Mobility  Outcome Measure Help needed turning from your back to your side while in a flat bed without using bedrails?: None Help needed moving from lying on your back to sitting on the side of a flat bed without using bedrails?: None Help needed moving to and from a bed to a chair (including a wheelchair)?: None Help needed standing up from a chair using your arms (e.g., wheelchair or bedside chair)?: None Help needed to walk in hospital room?: A Little Help needed climbing 3-5 steps with a railing? : A Little 6 Click Score: 22    End of Session Equipment Utilized During Treatment: Gait belt Activity Tolerance: Patient tolerated treatment well Patient left: in chair;with call bell/phone within reach;with chair alarm set Nurse Communication: Mobility status PT Visit Diagnosis: Unsteadiness on feet (R26.81)    Time: 8119-1478 PT Time Calculation (min) (ACUTE ONLY): 15 min   Charges:   PT Evaluation $PT Eval Low Complexity: 1 Low   PT General Charges $$ ACUTE PT VISIT: 1 Visit         Blanchard Kelch PT Acute Rehabilitation Services Office [phone] Weekend pager-[phone]   Rada Hay 08/03/2023, 3:53 PM

## 2023-08-03 NOTE — Progress Notes (Addendum)
Initial Nutrition Assessment  INTERVENTION:   -Magic cup BID with meals, each supplement provides 290 kcal and 9 grams of protein   -Multivitamin with minerals daily  -Added High Protein snack ideas to AVS  NUTRITION DIAGNOSIS:   Increased nutrient needs related to cancer and cancer related treatments as evidenced by estimated needs.  GOAL:   Patient will meet greater than or equal to 90% of their needs  MONITOR:   PO intake, Supplement acceptance, Labs, Weight trends, I & O's  REASON FOR ASSESSMENT:   Consult Assessment of nutrition requirement/status  ASSESSMENT:   77 y.o. female with medical history significant of CAD, bladder cancer, anemia, diastolic CHF, hx of PE 2022. Admitted for AKI and anemia.  Patient in room, sitting in chair, no family at bedside. Pt states she feels better today. Ate eggs and toast this morning for breakfast. States her appetite has been poor since 9/22. She has not been taking any vitamins other than Vitamin D or drinking protein shakes. Declines protein shakes and doesn't want to drink them. We discussed increasing her protein foods with her meals and snacks. Will place a handout in AVS.  Patient reports her UBW is ~150 lbs. She has gained weight recently, last weighing ~163 lbs.  Medications reviewed.  Labs reviewed: Low Na  Low iron  NUTRITION - FOCUSED PHYSICAL EXAM:  No depletions noted.  Diet Order:   Diet Order             Diet Heart Room service appropriate? Yes; Fluid consistency: Thin  Diet effective now                   EDUCATION NEEDS:   Education needs have been addressed  Skin:  Skin Assessment: Reviewed RN Assessment  Last BM:  PTA  Height:   Ht Readings from Last 1 Encounters:  08/02/23 5\' 3"  (1.6 m)    Weight:   Wt Readings from Last 1 Encounters:  08/02/23 76.7 kg    BMI:  Body mass index is 29.94 kg/m.  Estimated Nutritional Needs:   Kcal:  1550-1750  Protein:  70-85g  Fluid:   1.8L/day   Tilda Franco, MS, RD, LDN Inpatient Clinical Dietitian Contact information available via Amion

## 2023-08-03 NOTE — TOC Initial Note (Signed)
Transition of Care [facility]) - Initial/Assessment Note    Patient Details  Name: SHONDELL MANDATO MRN: [id number] Date of Birth: August 18, 1946  [facility] CM/SW Contact:    Howell Rucks, RN Phone Number: 08/03/2023, 10:48 AM  Clinical Narrative:  Met with pt at  bedside to introduce role of TOC/NCM and review for dc planning. Pt reports she has an established PCP and pharmacy, no current home care services, reports she has all the home DME she needs ( walker, etc), reports she feels safe returning home with support from her spouse, confirmed transportation available at discharge. PT eval, await recommendation.  TOC will continue to follow.                  Expected Discharge Plan: Home/Self Care Barriers to Discharge: Continued Medical Work up   Patient Goals and CMS Choice Patient states their goals for this hospitalization and ongoing recovery are:: return home with support from spouse          Expected Discharge Plan and Services       Living arrangements for the past 2 months: Single Family Home                                      Prior Living Arrangements/Services Living arrangements for the past 2 months: Single Family Home Lives with:: Spouse Patient language and need for interpreter reviewed:: Yes Do you feel safe going back to the place where you live?: Yes      Need for Family Participation in Patient Care: Yes (Comment) Care giver support system in place?: Yes (comment) Current home services: DME (pt reports she has needed home DME ( walker, etc)) Criminal Activity/Legal Involvement Pertinent to Current Situation/Hospitalization: No - Comment as needed  Activities of Daily Living   ADL Screening (condition at time of admission) Independently performs ADLs?: Yes (appropriate for developmental age) Is the patient deaf or have difficulty hearing?: No Does the patient have difficulty seeing, even when wearing glasses/contacts?: No Does the  patient have difficulty concentrating, remembering, or making decisions?: No  Permission Sought/Granted                  Emotional Assessment Appearance:: Appears stated age Attitude/Demeanor/Rapport: Gracious Affect (typically observed): Accepting Orientation: : Oriented to Self, Oriented to Place, Oriented to  Time, Oriented to Situation Alcohol / Substance Use: Not Applicable Psych Involvement: No (comment)  Admission diagnosis:  AKI (acute kidney injury) (HCC) [N17.9] Acute kidney injury (HCC) [N17.9] Anemia, unspecified type [D64.9] Patient Active Problem List   Diagnosis Date Noted   Hyponatremia 08/02/2023   UTI (urinary tract infection) 08/02/2023   Malignant neoplasm of renal pelvis (HCC) 11/16/2022   AKI (acute kidney injury) (HCC) 05/30/2022   Symptomatic anemia 05/15/2022   Acute pulmonary embolism (HCC) 04/22/2022   Urothelial carcinoma (HCC) 04/22/2022   CAD in native artery 01/11/2021   HFrEF (heart failure with reduced ejection fraction) (HCC) 01/11/2021   Chronic combined systolic and diastolic CHF (congestive heart failure) (HCC) 08/24/2020   Essential hypertension    Mixed hyperlipidemia    Depression    Hypokalemia    PCP:  Shon Hale, MD Pharmacy:   [facility] [address] - [place], [place] - [address] [place] [address] Phone: [phone] Fax: [phone]  [id number] [place], [place] - [address] AT [facility] [address] [facility] [address] [place] [address] Phone: [phone] Fax: [phone]  [facility] [address] [place] [address] Phone: [phone] Fax: [phone]     Social Determinants of Health (SDOH) Social History: SDOH Screenings   Food Insecurity: No Food Insecurity (08/02/2023)  Housing: Low Risk  (08/02/2023)  Transportation Needs: No Transportation Needs (08/02/2023)  Utilities:  Not At Risk (08/02/2023)  Tobacco Use: Medium Risk (08/02/2023)   SDOH Interventions:     Readmission Risk Interventions    08/03/2023   10:47 AM 06/01/2022   11:43 AM 04/25/2022   12:11 PM  Readmission Risk Prevention Plan  Transportation Screening Complete Complete Complete  PCP or Specialist Appt within 3-5 Days Complete    HRI or Home Care Consult Complete  Complete  Social Work Consult for Recovery Care Planning/Counseling Complete  Complete  Palliative Care Screening Not Applicable  Not Applicable  Medication Review Oceanographer) Complete Complete Complete  PCP or Specialist appointment within 3-5 days of discharge  Complete   HRI or Home Care Consult  Complete   SW Recovery Care/Counseling Consult  Complete   Palliative Care Screening  Not Applicable   Skilled Nursing Facility  Not Applicable

## 2023-08-03 NOTE — Discharge Instructions (Signed)
Add Protein to Your Meals and Snacks Choose at least one protein food at each meal and snack to increase your daily intake. Strategies Add  cup nonfat dry milk powder or protein powder to make a high-protein milk to drink or to use in recipes that call for milk. Vanilla or peppermint extract or unsweetened cocoa powder could help to boost the flavor. Add hard-cooked eggs, leftover meat, grated cheese, canned beans or tofu to noodles, rice, salads, sandwiches, soups, casseroles, pasta, tuna and other mixed dishes. Add powdered milk or protein powder to hot cereals, meatloaf, casseroles, scrambled eggs, sauces, cream soups, and shakes. Add beans and lentils to salads, soups, casseroles, and vegetable dishes. Eat cottage cheese or yogurt, especially Greek yogurt, with fruit as a snack or dessert. Eat peanut or other nut butters on crackers, bread, toast, waffles, apples, bananas or celery sticks. Add it to milkshakes, smoothies, or desserts. Consider a ready-made protein shake. Your RDN will make recommendations. Small Meal and Snack Ideas These snacks and meals are recommended when you have to eat but aren't necessarily hungry.  They are good choices because they are high in protein and high in calories.  2 graham crackers 2 tablespoons peanut or other nut butter 1 cup milk 2 slices whole wheat toast topped with:  avocado, mashed Seasoning of your choice   cup Greek yogurt  cup fruit  cup granola 2 deviled egg halves 5 whole wheat crackers  1 cup cream of tomato soup  grilled cheese sandwich 1 toasted waffle topped with: 2 tablespoons peanut or nut butter 1 tablespoon jam  Trail mix made with:  cup nuts  cup dried fruit  cup cold cereal, any variety  cup oatmeal or cream of wheat cereal 1 tablespoon peanut or nut butter  cup diced fruit

## 2023-08-03 NOTE — Progress Notes (Signed)
Mobility Specialist - Progress Note  Pre-mobility: 64 bpm HR, 96% SpO2 During mobility: 81 bpm HR, 93% SpO2 Post-mobility: 97% SPO2   08/03/23 1432  Oxygen Therapy  O2 Device Room Air  Patient Activity (if Appropriate) Ambulating  Mobility  Activity Ambulated with assistance in hallway  Level of Assistance Standby assist, set-up cues, supervision of patient - no hands on  Assistive Device Front wheel walker  Distance Ambulated (ft) 200 ft  Range of Motion/Exercises Active  Activity Response Tolerated well  Mobility Referral Yes  $Mobility charge 1 Mobility  Mobility Specialist Start Time (ACUTE ONLY) 1415  Mobility Specialist Stop Time (ACUTE ONLY) 1432  Mobility Specialist Time Calculation (min) (ACUTE ONLY) 17 min   Pt was found in bed and agreeable to ambulate. Pt about halfway through session stated feeling lightheaded. Returned back to room. At EOS was left in bed with all needs met. Call bell in reach and bed alarm on.  Billey Chang Mobility Specialist

## 2023-08-04 DIAGNOSIS — N179 Acute kidney failure, unspecified: Secondary | ICD-10-CM | POA: Diagnosis not present

## 2023-08-04 DIAGNOSIS — I2510 Atherosclerotic heart disease of native coronary artery without angina pectoris: Secondary | ICD-10-CM | POA: Diagnosis not present

## 2023-08-04 DIAGNOSIS — N184 Chronic kidney disease, stage 4 (severe): Secondary | ICD-10-CM

## 2023-08-04 DIAGNOSIS — E782 Mixed hyperlipidemia: Secondary | ICD-10-CM | POA: Diagnosis not present

## 2023-08-04 DIAGNOSIS — B3749 Other urogenital candidiasis: Secondary | ICD-10-CM

## 2023-08-04 DIAGNOSIS — D649 Anemia, unspecified: Secondary | ICD-10-CM | POA: Diagnosis not present

## 2023-08-04 LAB — CBC WITH DIFFERENTIAL/PLATELET
Abs Immature Granulocytes: 0.03 10*3/uL (ref 0.00–0.07)
Basophils Absolute: 0 10*3/uL (ref 0.0–0.1)
Basophils Relative: 1 %
Eosinophils Absolute: 0.8 10*3/uL — ABNORMAL HIGH (ref 0.0–0.5)
Eosinophils Relative: 10 %
HCT: 23.9 % — ABNORMAL LOW (ref 36.0–46.0)
Hemoglobin: 7.4 g/dL — ABNORMAL LOW (ref 12.0–15.0)
Immature Granulocytes: 0 %
Lymphocytes Relative: 17 %
Lymphs Abs: 1.3 10*3/uL (ref 0.7–4.0)
MCH: 29 pg (ref 26.0–34.0)
MCHC: 31 g/dL (ref 30.0–36.0)
MCV: 93.7 fL (ref 80.0–100.0)
Monocytes Absolute: 0.6 10*3/uL (ref 0.1–1.0)
Monocytes Relative: 8 %
Neutro Abs: 4.7 10*3/uL (ref 1.7–7.7)
Neutrophils Relative %: 64 %
Platelets: 196 10*3/uL (ref 150–400)
RBC: 2.55 MIL/uL — ABNORMAL LOW (ref 3.87–5.11)
RDW: 15.7 % — ABNORMAL HIGH (ref 11.5–15.5)
WBC: 7.4 10*3/uL (ref 4.0–10.5)
nRBC: 0 % (ref 0.0–0.2)

## 2023-08-04 LAB — BASIC METABOLIC PANEL
Anion gap: 8 (ref 5–15)
BUN: 53 mg/dL — ABNORMAL HIGH (ref 8–23)
CO2: 19 mmol/L — ABNORMAL LOW (ref 22–32)
Calcium: 7.9 mg/dL — ABNORMAL LOW (ref 8.9–10.3)
Chloride: 109 mmol/L (ref 98–111)
Creatinine, Ser: 5.54 mg/dL — ABNORMAL HIGH (ref 0.44–1.00)
GFR, Estimated: 8 mL/min — ABNORMAL LOW (ref >60)
Glucose, Bld: 89 mg/dL (ref 70–99)
Potassium: 4 mmol/L (ref 3.5–5.1)
Sodium: 136 mmol/L (ref 135–145)

## 2023-08-04 LAB — CBC
HCT: 25.8 % — ABNORMAL LOW (ref 36.0–46.0)
Hemoglobin: 7.9 g/dL — ABNORMAL LOW (ref 12.0–15.0)
MCH: 29 pg (ref 26.0–34.0)
MCHC: 30.6 g/dL (ref 30.0–36.0)
MCV: 94.9 fL (ref 80.0–100.0)
Platelets: 214 10*3/uL (ref 150–400)
RBC: 2.72 MIL/uL — ABNORMAL LOW (ref 3.87–5.11)
RDW: 15.5 % (ref 11.5–15.5)
WBC: 7.8 10*3/uL (ref 4.0–10.5)
nRBC: 0 % (ref 0.0–0.2)

## 2023-08-04 LAB — BRAIN NATRIURETIC PEPTIDE: B Natriuretic Peptide: 276.3 pg/mL — ABNORMAL HIGH (ref 0.0–100.0)

## 2023-08-04 MED ORDER — SODIUM CHLORIDE 0.9 % IV SOLN: INTRAVENOUS | Status: DC

## 2023-08-04 MED ORDER — CYANOCOBALAMIN 1000 MCG/ML IJ SOLN
1000.0000 ug | Freq: Every day | INTRAMUSCULAR | Status: DC
  Administered 2023-08-04 – 2023-08-10 (×7): 1000 ug via SUBCUTANEOUS
  Filled 2023-08-04 (×7): qty 1

## 2023-08-04 MED ORDER — FLUCONAZOLE 100 MG PO TABS
200.0000 mg | ORAL_TABLET | Freq: Every day | ORAL | Status: DC
  Administered 2023-08-05 – 2023-08-10 (×6): 200 mg via ORAL
  Filled 2023-08-04 (×6): qty 2

## 2023-08-04 MED ORDER — ALUM & MAG HYDROXIDE-SIMETH 200-200-20 MG/5ML PO SUSP
30.0000 mL | ORAL | Status: DC | PRN
  Administered 2023-08-04: 30 mL via ORAL
  Filled 2023-08-04: qty 30

## 2023-08-04 MED ORDER — FLUCONAZOLE IN SODIUM CHLORIDE 200-0.9 MG/100ML-% IV SOLN
200.0000 mg | INTRAVENOUS | Status: DC
  Administered 2023-08-04: 200 mg via INTRAVENOUS
  Filled 2023-08-04: qty 100

## 2023-08-04 MED ORDER — FLUCONAZOLE IN SODIUM CHLORIDE 200-0.9 MG/100ML-% IV SOLN: 200.0000 mg | INTRAVENOUS | Status: DC

## 2023-08-04 NOTE — Progress Notes (Signed)
Mobility Specialist - Progress Note  Pre-mobility: 99%SpO2 During mobility: 96% SpO2 Post-mobility:97% SPO2   08/04/23 1420  Mobility  Activity Ambulated with assistance in hallway  Level of Assistance Standby assist, set-up cues, supervision of patient - no hands on  Assistive Device Front wheel walker  Distance Ambulated (ft) 350 ft  Range of Motion/Exercises Active  Activity Response Tolerated well  Mobility Referral Yes  $Mobility charge 1 Mobility  Mobility Specialist Start Time (ACUTE ONLY) 1405  Mobility Specialist Stop Time (ACUTE ONLY) 1420  Mobility Specialist Time Calculation (min) (ACUTE ONLY) 15 min   Pt was found in bed and agreeable to ambulate. No complaints with session. At EOS returned to bed with all needs met. Call bell in reach.  Billey Chang Mobility Specialist

## 2023-08-04 NOTE — Progress Notes (Signed)
PROGRESS NOTE    [name]  WUJ:[id number] DOB: May 01, 1946 DOA: 08/02/2023 PCP: Shon Hale, MD    Chief Complaint  Patient presents with   Abnormal Labs    Brief Narrative:  77 y.o. female with medical history significant of CAD, bladder cancer, anemia, diastolic CHF, hx of PE 2022, CKD IV b/l creat ~2.6 on 06/18/23.presented with fatigue, hematuria, abnormal labs and was sent in the emergency department hematuria was seen by her primary care provider who noticed that she has acute renal failure with creatinine up to 5 up from baseline of 2 and progressive anemia with hemoglobin down to 9.7 down from 11 at baseline. Had a few episodes of nonbloody diarrhea last week Hemoccult negative in ER Not on any anticoagulation does has history of bladder cancer followed by urology Dr. Berneice Heinrich on 27 September had cystoscopy of left retrograde pyelogram and left ureteroscopy with laser ablation of the tumor with left ureteral stent placement. She was admitted for acute kidney injury in the setting of CKD, and Anemia . CXR: NAD CT Abd/pelvis: Left ureteral stent in expected location with persistent perinephric stranding, no hydronephrosis. Left para-aortic retroperitoneal adenopathy, increased since previous    Assessment & Plan:   Active Problems:   Symptomatic anemia   Essential hypertension   Mixed hyperlipidemia   Chronic combined systolic and diastolic CHF (congestive heart failure) (HCC)   Urothelial carcinoma (HCC)   Hypokalemia   CAD in native artery   AKI (acute kidney injury) (HCC)   Hyponatremia   UTI (urinary tract infection)   Candiduria   Acute renal failure superimposed on stage 4 chronic kidney disease (HCC)   Acute kidney injury (HCC)   #1 normocytic anemia -Patient presented with fatigue, generalized weakness, noted to have a hemoglobin of 9.2 on admission from 11.4(06/18/2023). -Hemoglobin currently at 7.9 today. -Patient denies any overt  bleeding. -Patient denied any overt hematuria however per admission H&P is noted that patient did have hematuria. -Urinalysis done was cloudy, large hemoglobin, large leukocytes with WBCs > 50. -FOBT done was negative. -Anemia panel with iron level of 13, TIBC of 197, ferritin of 425, folate of 8.0, vitamin B12 of 299. -Transfusion threshold hemoglobin < 8. -Repeat CBC this afternoon. -May need IV iron. -Will start vitamin B12 1000 mcg daily while in house and transition to oral vitamin B12 on discharge. -Outpatient follow-up.  2.  Hypertension -Continue Coreg, Imdur.  3.?  UTI/candiduria/urothelial cancer  -Patient being followed by urology and underwent ureteral stent placement in November 2024. -CT abdomen and pelvis with left ureteral stent in expected location with persistent perinephric stranding, no hydronephrosis.  Left para-aortic retroperitoneal adenopathy, increased since previous.  Aortic atherosclerosis.  Negative for hydronephrosis. -Patient noted to have a history of rare bacteria in the urine and started on meropenem due to prior resistant UTI. -Patient currently on IV cefepime. -Urinalysis done with budding yeast present which was also noted on prior urinalysis. -Urine cultures however with 70,000 colonies of yeast. -Will start IV Diflucan and treat empirically for 7 days due to history of longstanding high-grade bladder upper tract cancer status post right nephroureterectomy 2008 with ongoing left high-grade renal pelvis cancer since 2020 with most recent tumor ablation and stent placement September 2024. -Continue empiric IV cefepime and treat for total of 3 to 5 days. -Supportive care. -Urology consulted.  4.  Hyperlipidemia -Statin.  5.  Chronic combined systolic and diastolic CHF -Patient currently on the dry side and not volume overloaded on examination. -Continue  IV fluids and monitor volume status closely.  6.  CAD -Aspirin on hold due to presentation with  anemia. -Continue Coreg, Crestor.  7.  Hyponatremia -Likely secondary to hypovolemic hyponatremia. -Improving with hydration. -Follow.  8.  AKI on CKD stage IV (baseline creatinine approximately 2.6) -Patient had presented with abnormal labs from PCPs office. -Creatinine on admission was 6.13 with last creatinine noted at 2.60 (06/18/2023). -Likely secondary to prerenal azotemia due to poor oral intake and dehydration. -CT abdomen and pelvis done negative for hydronephrosis. -Urine output of 1.4 L over the past 24 hours. -Patient improving clinically. -Renal function slowly trending down creatinine currently at 5.54 from 6.13 on admission. -Continue hydration with IV fluids.  9.  Hypokalemia -Repleted.    DVT prophylaxis: SCDs Code Status: Full Family Communication: Updated patient.  No family at bedside. Disposition: TBD  Status is: Inpatient Remains inpatient appropriate because: Severity of illness   Consultants:  Urology: Dr. Jennette Dubin 08/03/2023  Procedures:  CT abdomen and pelvis 08/02/2023 Chest x-ray 08/02/2023   Antimicrobials:  Anti-infectives (From admission, onward)    Start     Dose/Rate Route Frequency Ordered Stop   08/05/23 1000  fluconazole (DIFLUCAN) tablet 200 mg        200 mg Oral Daily 08/04/23 1050     08/04/23 1000  fluconazole (DIFLUCAN) IVPB 200 mg  Status:  Discontinued        200 mg 100 mL/hr over 60 Minutes Intravenous Every 24 hours 08/04/23 0825 08/04/23 1050   08/04/23 0915  fluconazole (DIFLUCAN) IVPB 200 mg  Status:  Discontinued        200 mg 100 mL/hr over 60 Minutes Intravenous Every 24 hours 08/04/23 0818 08/04/23 0825   08/03/23 2200  meropenem (MERREM) 500 mg in sodium chloride 0.9 % 100 mL IVPB  Status:  Discontinued        500 mg 200 mL/hr over 30 Minutes Intravenous Every 24 hours 08/02/23 2307 08/03/23 1251   08/03/23 2200  ceFEPIme (MAXIPIME) 1 g in sodium chloride 0.9 % 100 mL IVPB        1 g 200 mL/hr over 30 Minutes  Intravenous Every 24 hours 08/03/23 1251     08/02/23 2100  meropenem (MERREM) 1 g in sodium chloride 0.9 % 100 mL IVPB        1 g 200 mL/hr over 30 Minutes Intravenous  Once 08/02/23 2040 08/03/23 0013   08/02/23 2030  ertapenem (INVANZ) 500 mg in sodium chloride 0.9 % 50 mL IVPB  Status:  Discontinued        500 mg 100 mL/hr over 30 Minutes Intravenous Every 24 hours 08/02/23 2026 08/02/23 2039         Subjective: Patient laying in bed.  Overall states feeling better than she did on admission.  Patient does endorse poor oral intake prior to admission with decreased appetite.  Denies any chest pain or shortness of breath.  No abdominal pain.  Denies any dysuria.  Denies any overt gross hematuria.  States has good urine output.  Objective: Vitals:   08/03/23 2004 08/04/23 0317 08/04/23 0830 08/04/23 1229  BP: (!) 140/66 135/60 (!) 139/51 (!) 123/59  Pulse: 67 66  65  Resp:      Temp: 100.3 F (37.9 C) 98 F (36.7 C)  98.9 F (37.2 C)  TempSrc: Oral Oral  Oral  SpO2: 95% 96%  100%  Weight:      Height:        Intake/Output Summary (  Last 24 hours) at 08/04/2023 1518 Last data filed at 08/04/2023 1300 Gross per 24 hour  Intake 1060.64 ml  Output 1700 ml  Net -639.36 ml   Filed Weights   08/02/23 1614  Weight: 76.7 kg    Examination:  General exam: Appears calm and comfortable.  Dry mucous membranes. Respiratory system: Clear to auscultation. Respiratory effort normal. Cardiovascular system: S1 & S2 heard, RRR. No JVD, murmurs, rubs, gallops or clicks. No pedal edema. Gastrointestinal system: Abdomen is nondistended, soft and nontender. No organomegaly or masses felt. Normal bowel sounds heard. Central nervous system: Alert and oriented. No focal neurological deficits. Extremities: Symmetric 5 x 5 power. Skin: No rashes, lesions or ulcers Psychiatry: Judgement and insight appear normal. Mood & affect appropriate.     Data Reviewed: I have personally reviewed  following labs and imaging studies  CBC: Recent Labs  Lab 08/02/23 1633 08/03/23 0400 08/04/23 0420  WBC 13.6* 12.6* 7.8  NEUTROABS 9.9*  --   --   HGB 9.2* 8.8* 7.9*  HCT 29.4* 27.7* 25.8*  MCV 91.6 92.3 94.9  PLT 254 229 214    Basic Metabolic Panel: Recent Labs  Lab 08/02/23 1633 08/02/23 2233 08/03/23 0400 08/04/23 0420  NA 131* 128* 133* 136  K 3.1* 3.7 3.8 4.0  CL 101 102 105 109  CO2 19* 18* 19* 19*  GLUCOSE 113* 107* 109* 89  BUN 56* 54* 53* 53*  CREATININE 6.13* 6.00* 5.81* 5.54*  CALCIUM 8.2* 7.9* 8.2* 7.9*  MG  --   --  1.8  --   PHOS  --   --  4.1  --     GFR: Estimated Creatinine Clearance: 8.6 mL/min (A) (by C-G formula based on SCr of 5.54 mg/dL (H)).  Liver Function Tests: Recent Labs  Lab 08/02/23 1633 08/03/23 0400  AST 10* 11*  ALT 11 11  ALKPHOS 62 56  BILITOT 0.6 0.4  PROT 8.0 7.2  ALBUMIN 2.8* 2.6*    CBG: No results for input(s): "GLUCAP" in the last 168 hours.   Recent Results (from the past 240 hour(s))  Urine Culture     Status: Abnormal   Collection Time: 08/02/23  6:04 PM   Specimen: Urine, Random  Result Value Ref Range Status   Specimen Description   Final    URINE, RANDOM Performed at [facility], [address], [place], [place] [address]    Special Requests   Final    NONE Reflexed from [id number] Performed at [facility], [address] [place], [place] [address]    Culture 70,000 COLONIES/mL YEAST (A)  Final   Report Status 08/03/2023 FINAL  Final         Radiology Studies: CT ABDOMEN PELVIS WO CONTRAST  Result Date: 08/02/2023 CLINICAL DATA:  Evaluate patency of the ureteral stent EXAM: CT ABDOMEN AND PELVIS WITHOUT CONTRAST TECHNIQUE: Multidetector CT imaging of the abdomen and pelvis was performed following the standard protocol without IV contrast. RADIATION DOSE REDUCTION: This exam was performed according to the departmental dose-optimization program which  includes automated exposure control, adjustment of the mA and/or kV according to patient size and/or use of iterative reconstruction technique. COMPARISON:  04/21/2022 FINDINGS: Lower chest: No pleural or pericardial effusion. Coronary and aortic calcified plaque. Hepatobiliary: No focal liver abnormality is seen. No gallstones, gallbladder wall thickening, or biliary dilatation. Pancreas: Unremarkable. No pancreatic ductal dilatation or surrounding inflammatory changes. Spleen: Normal in size without focal abnormality. Adrenals/Urinary Tract: No adrenal mass.  Right kidney absent. Normal left renal contour. Double-J left ureteral stent in expected location with only mild distension of the lower pole left renal collecting system, stable since previous. Mild inflammatory/edematous changes in the perinephric fat. Urinary bladder incompletely distended. Stomach/Bowel: Small hiatal hernia. Stomach nondistended. Small bowel decompressed. Appendix not identified. Colon is partially distended, with a few scattered descending diverticula; no adjacent inflammatory change. Vascular/Lymphatic: Scattered heavy aortoiliac atheromatous calcifications without aneurysm. Left para-aortic retroperitoneal adenopathy, largest 1.5 cm (Im44,Se2) , previously 0.7 cm. A few subcentimeter aortocaval nodes. No pelvic or mesenteric adenopathy. Reproductive: Status post hysterectomy. No adnexal masses. Other: Bilateral pelvic phleboliths.  No ascites.  No free air. Musculoskeletal: Multilevel thoracolumbar spondylitic change. IMPRESSION: 1. Left ureteral stent in expected location with persistent perinephric stranding, no hydronephrosis. 2. Left para-aortic retroperitoneal adenopathy, increased since previous. 3.  Aortic Atherosclerosis (ICD10-I70.0). Electronically Signed   By: Corlis Leak M.D.   On: 08/02/2023 20:03   DG Chest 2 View  Result Date: 08/02/2023 CLINICAL DATA:  fatigue, leukocytosis, hematuria EXAM: CHEST - 2 VIEW COMPARISON:   06/05/2022 FINDINGS: Lungs are clear. Heart size and mediastinal contours are within normal limits. Aortic Atherosclerosis (ICD10-170.0). No effusion. Visualized bones unremarkable. IMPRESSION: No acute cardiopulmonary disease. Electronically Signed   By: Corlis Leak M.D.   On: 08/02/2023 19:54        Scheduled Meds:  carvedilol  3.125 mg Oral BID WC   cyanocobalamin  1,000 mcg Subcutaneous Daily   [START ON 08/05/2023] fluconazole  200 mg Oral Daily   isosorbide mononitrate  15 mg Oral Daily   rosuvastatin  40 mg Oral Daily   sertraline  50 mg Oral q morning   traZODone  150 mg Oral QHS   Continuous Infusions:  sodium chloride 100 mL/hr at 08/04/23 0830   ceFEPime (MAXIPIME) IV 1 g (08/03/23 2152)     LOS: 2 days    Time spent: 40 minutes    Ramiro Harvest, MD [facility]   To contact the attending provider between 7A-7P or the covering provider during after hours 7P-7A, please log into the web site [email or web address] and access using universal [place] password for that web site. If you do not have the password, please call the hospital operator.  08/04/2023, 3:18 PM

## 2023-08-05 DIAGNOSIS — E782 Mixed hyperlipidemia: Secondary | ICD-10-CM | POA: Diagnosis not present

## 2023-08-05 DIAGNOSIS — I2510 Atherosclerotic heart disease of native coronary artery without angina pectoris: Secondary | ICD-10-CM | POA: Diagnosis not present

## 2023-08-05 DIAGNOSIS — N179 Acute kidney failure, unspecified: Secondary | ICD-10-CM | POA: Diagnosis not present

## 2023-08-05 DIAGNOSIS — D649 Anemia, unspecified: Secondary | ICD-10-CM | POA: Diagnosis not present

## 2023-08-05 LAB — BASIC METABOLIC PANEL
Anion gap: 6 (ref 5–15)
BUN: 52 mg/dL — ABNORMAL HIGH (ref 8–23)
CO2: 19 mmol/L — ABNORMAL LOW (ref 22–32)
Calcium: 7.7 mg/dL — ABNORMAL LOW (ref 8.9–10.3)
Chloride: 113 mmol/L — ABNORMAL HIGH (ref 98–111)
Creatinine, Ser: 5.24 mg/dL — ABNORMAL HIGH (ref 0.44–1.00)
GFR, Estimated: 8 mL/min — ABNORMAL LOW (ref >60)
Glucose, Bld: 91 mg/dL (ref 70–99)
Potassium: 4.8 mmol/L (ref 3.5–5.1)
Sodium: 138 mmol/L (ref 135–145)

## 2023-08-05 LAB — CBC
HCT: 24.6 % — ABNORMAL LOW (ref 36.0–46.0)
Hemoglobin: 7.4 g/dL — ABNORMAL LOW (ref 12.0–15.0)
MCH: 28.8 pg (ref 26.0–34.0)
MCHC: 30.1 g/dL (ref 30.0–36.0)
MCV: 95.7 fL (ref 80.0–100.0)
Platelets: 204 10*3/uL (ref 150–400)
RBC: 2.57 MIL/uL — ABNORMAL LOW (ref 3.87–5.11)
RDW: 15.8 % — ABNORMAL HIGH (ref 11.5–15.5)
WBC: 8.9 10*3/uL (ref 4.0–10.5)
nRBC: 0 % (ref 0.0–0.2)

## 2023-08-05 LAB — HEMOGLOBIN AND HEMATOCRIT, BLOOD
HCT: 27 % — ABNORMAL LOW (ref 36.0–46.0)
Hemoglobin: 8.3 g/dL — ABNORMAL LOW (ref 12.0–15.0)

## 2023-08-05 LAB — PREPARE RBC (CROSSMATCH)

## 2023-08-05 MED ORDER — SODIUM CHLORIDE 0.9 % IV SOLN
INTRAVENOUS | Status: DC
Start: 2023-08-05 — End: 2023-08-06

## 2023-08-05 MED ORDER — SODIUM CHLORIDE 0.9% IV SOLUTION: Freq: Once | INTRAVENOUS | Status: AC

## 2023-08-05 MED ORDER — SODIUM BICARBONATE 650 MG PO TABS
650.0000 mg | ORAL_TABLET | Freq: Two times a day (BID) | ORAL | Status: AC
  Administered 2023-08-05 – 2023-08-09 (×10): 650 mg via ORAL
  Filled 2023-08-05 (×10): qty 1

## 2023-08-05 MED ORDER — ACETAMINOPHEN 325 MG PO TABS
650.0000 mg | ORAL_TABLET | Freq: Once | ORAL | Status: AC
  Administered 2023-08-05: 650 mg via ORAL
  Filled 2023-08-05: qty 2

## 2023-08-05 NOTE — Progress Notes (Signed)
Mobility Specialist - Progress Note   08/05/23 1307  Mobility  Activity Ambulated with assistance in hallway  Level of Assistance Standby assist, set-up cues, supervision of patient - no hands on  Assistive Device Front wheel walker  Distance Ambulated (ft) 200 ft  Range of Motion/Exercises Active  Activity Response Tolerated well  Mobility Referral Yes  $Mobility charge 1 Mobility  Mobility Specialist Start Time (ACUTE ONLY) 1255  Mobility Specialist Stop Time (ACUTE ONLY) 1307  Mobility Specialist Time Calculation (min) (ACUTE ONLY) 12 min   Pt was found in bed and agreeable to ambulate. No complaints with session. At EOS returned to bed with all needs met. Call bell in reach and bed alarm on.  Billey Chang Mobility Specialist

## 2023-08-05 NOTE — Progress Notes (Signed)
PROGRESS NOTE    [name]  FAO:[id number] DOB: 11-May-1946 DOA: 08/02/2023 PCP: Shon Hale, MD    Chief Complaint  Patient presents with   Abnormal Labs    Brief Narrative:  77 y.o. female with medical history significant of CAD, bladder cancer, anemia, diastolic CHF, hx of PE 2022, CKD IV b/l creat ~2.6 on 06/18/23.presented with fatigue, hematuria, abnormal labs and was sent in the emergency department hematuria was seen by her primary care provider who noticed that she has acute renal failure with creatinine up to 5 up from baseline of 2 and progressive anemia with hemoglobin down to 9.7 down from 11 at baseline. Had a few episodes of nonbloody diarrhea last week Hemoccult negative in ER Not on any anticoagulation does has history of bladder cancer followed by urology Dr. Berneice Heinrich on 27 September had cystoscopy of left retrograde pyelogram and left ureteroscopy with laser ablation of the tumor with left ureteral stent placement. She was admitted for acute kidney injury in the setting of CKD, and Anemia . CXR: NAD CT Abd/pelvis: Left ureteral stent in expected location with persistent perinephric stranding, no hydronephrosis. Left para-aortic retroperitoneal adenopathy, increased since previous    Assessment & Plan:   Active Problems:   Symptomatic anemia   Essential hypertension   Mixed hyperlipidemia   Chronic combined systolic and diastolic CHF (congestive heart failure) (HCC)   Urothelial carcinoma (HCC)   Hypokalemia   CAD in native artery   AKI (acute kidney injury) (HCC)   Hyponatremia   UTI (urinary tract infection)   Candiduria   Acute renal failure superimposed on stage 4 chronic kidney disease (HCC)   Acute kidney injury (HCC)   #1 normocytic anemia -Patient presented with fatigue, generalized weakness, noted to have a hemoglobin of 9.2 on admission from 11.4(06/18/2023). -Hemoglobin currently at 7.9 today. -Patient denies any overt  bleeding. -Patient denied any overt hematuria however per admission H&P is noted that patient did have hematuria. -Urinalysis done was cloudy, large hemoglobin, large leukocytes with WBCs > 50. -FOBT done was negative. -Anemia panel with iron level of 13, TIBC of 197, ferritin of 425, folate of 8.0, vitamin B12 of 299. -Continue vitamin B12 1000 mcg subcu daily while in house and transition to oral vitamin B12 on discharge. -May need IV iron. -Hemoglobin currently at 7.4 this morning. -Transfuse 1 unit PRBCs. -Follow H&H. -Transfusion threshold hemoglobin < 8. -Outpatient follow-up.  2.  Hypertension -Imdur, Coreg.    3.?  UTI/candiduria/urothelial cancer  -Patient being followed by urology and underwent ureteral stent placement in November 2024. -CT abdomen and pelvis with left ureteral stent in expected location with persistent perinephric stranding, no hydronephrosis.  Left para-aortic retroperitoneal adenopathy, increased since previous.  Aortic atherosclerosis.  Negative for hydronephrosis. -Patient noted to have a history of rare bacteria in the urine and started on meropenem due to prior resistant UTI. -Patient currently on IV cefepime. -Urinalysis done with budding yeast present which was also noted on prior urinalysis. -Urine cultures however with 70,000 colonies of yeast. -Continue IV Diflucan and treat empirically for 7 days due to history of longstanding high-grade bladder upper tract cancer status post right nephroureterectomy 2008 with ongoing left high-grade renal pelvis cancer since 2020 with most recent tumor ablation and stent placement September 2024. -Continue empiric IV cefepime and treat for total of 3 to 5 days. -Supportive care. -Urology consulted.  4.  Hyperlipidemia -Continue statin.  5.  Chronic combined systolic and diastolic CHF -Patient currently on the  dry side and not volume overloaded on examination. -Continue IV fluids and monitor volume status  closely.  6.  CAD -Aspirin on hold due to presentation with anemia. -Continue Crestor, Coreg.    7.  Hyponatremia -Likely secondary to hypovolemic hyponatremia. -Improved with hydration. -Follow.  8.  AKI on CKD stage IV (baseline creatinine approximately 2.6) -Patient had presented with abnormal labs from PCPs office. -Creatinine on admission was 6.13 with last creatinine noted at 2.60 (06/18/2023). -Likely secondary to prerenal azotemia due to poor oral intake and dehydration. -CT abdomen and pelvis done negative for hydronephrosis. -Urine output of 1.6 L over the past 24 hours. -Patient improving clinically. -Renal function slowly trending down creatinine currently at 5.24 from 6.13 on admission. -Continue hydration with IV fluids.  9.  Hypokalemia -Repleted. -Potassium of 4.8.    DVT prophylaxis: SCDs Code Status: Full Family Communication: Updated patient.  No family at bedside. Disposition: TBD  Status is: Inpatient Remains inpatient appropriate because: Severity of illness   Consultants:  Urology: Dr. Jennette Dubin 08/03/2023  Procedures:  CT abdomen and pelvis 08/02/2023 Chest x-ray 08/02/2023 Transfused 1 units PRBCs pending  Antimicrobials:  Anti-infectives (From admission, onward)    Start     Dose/Rate Route Frequency Ordered Stop   08/05/23 1000  fluconazole (DIFLUCAN) tablet 200 mg        200 mg Oral Daily 08/04/23 1050     08/04/23 1000  fluconazole (DIFLUCAN) IVPB 200 mg  Status:  Discontinued        200 mg 100 mL/hr over 60 Minutes Intravenous Every 24 hours 08/04/23 0825 08/04/23 1050   08/04/23 0915  fluconazole (DIFLUCAN) IVPB 200 mg  Status:  Discontinued        200 mg 100 mL/hr over 60 Minutes Intravenous Every 24 hours 08/04/23 0818 08/04/23 0825   08/03/23 2200  meropenem (MERREM) 500 mg in sodium chloride 0.9 % 100 mL IVPB  Status:  Discontinued        500 mg 200 mL/hr over 30 Minutes Intravenous Every 24 hours 08/02/23 2307 08/03/23 1251    08/03/23 2200  ceFEPIme (MAXIPIME) 1 g in sodium chloride 0.9 % 100 mL IVPB        1 g 200 mL/hr over 30 Minutes Intravenous Every 24 hours 08/03/23 1251     08/02/23 2100  meropenem (MERREM) 1 g in sodium chloride 0.9 % 100 mL IVPB        1 g 200 mL/hr over 30 Minutes Intravenous  Once 08/02/23 2040 08/03/23 0013   08/02/23 2030  ertapenem (INVANZ) 500 mg in sodium chloride 0.9 % 50 mL IVPB  Status:  Discontinued        500 mg 100 mL/hr over 30 Minutes Intravenous Every 24 hours 08/02/23 2026 08/02/23 2039         Subjective: Patient laying in bed.  Denies any chest pain or shortness of breath.  No abdominal pain.  States weakness has improved.  Overall feeling well.  Tolerating oral intake.  States has good urine output.   Objective: Vitals:   08/05/23 0442 08/05/23 0755 08/05/23 1109 08/05/23 1135  BP: 135/62 130/68 (!) 161/87 107/63  Pulse: 66 (!) 59 (!) 55 (!) 51  Resp: 20 16 18 16   Temp: 97.7 F (36.5 C) 98 F (36.7 C) 98 F (36.7 C) 97.7 F (36.5 C)  TempSrc: Oral Oral Oral Oral  SpO2: 98%  97% 99%  Weight:  77 kg    Height:  5\' 3"  (1.6 m)  Intake/Output Summary (Last 24 hours) at 08/05/2023 1203 Last data filed at 08/05/2023 1110 Gross per 24 hour  Intake 2440.69 ml  Output 1900 ml  Net 540.69 ml   Filed Weights   08/02/23 1614 08/05/23 0755  Weight: 76.7 kg 77 kg    Examination:  General exam: NAD.  Dry mucous membranes.  Respiratory system: Lungs clear to auscultation bilaterally.  No wheezes, no crackles, no rhonchi.  Fair air movement.  Speaking in full sentences.  Cardiovascular system: Regular rate rhythm no murmurs rubs or gallops.  No JVD.  No lower extremity edema. Gastrointestinal system: Abdomen is soft, nontender, nondistended, positive bowel sounds.  No rebound.  No guarding.  Central nervous system: Alert and oriented. No focal neurological deficits. Extremities: Symmetric 5 x 5 power. Skin: No rashes, lesions or ulcers Psychiatry:  Judgement and insight appear normal. Mood & affect appropriate.     Data Reviewed: I have personally reviewed following labs and imaging studies  CBC: Recent Labs  Lab 08/02/23 1633 08/03/23 0400 08/04/23 0420 08/04/23 1543 08/05/23 0433  WBC 13.6* 12.6* 7.8 7.4 8.9  NEUTROABS 9.9*  --   --  4.7  --   HGB 9.2* 8.8* 7.9* 7.4* 7.4*  HCT 29.4* 27.7* 25.8* 23.9* 24.6*  MCV 91.6 92.3 94.9 93.7 95.7  PLT 254 229 214 196 204    Basic Metabolic Panel: Recent Labs  Lab 08/02/23 1633 08/02/23 2233 08/03/23 0400 08/04/23 0420 08/05/23 0433  NA 131* 128* 133* 136 138  K 3.1* 3.7 3.8 4.0 4.8  CL 101 102 105 109 113*  CO2 19* 18* 19* 19* 19*  GLUCOSE 113* 107* 109* 89 91  BUN 56* 54* 53* 53* 52*  CREATININE 6.13* 6.00* 5.81* 5.54* 5.24*  CALCIUM 8.2* 7.9* 8.2* 7.9* 7.7*  MG  --   --  1.8  --   --   PHOS  --   --  4.1  --   --     GFR: Estimated Creatinine Clearance: 9.1 mL/min (A) (by C-G formula based on SCr of 5.24 mg/dL (H)).  Liver Function Tests: Recent Labs  Lab 08/02/23 1633 08/03/23 0400  AST 10* 11*  ALT 11 11  ALKPHOS 62 56  BILITOT 0.6 0.4  PROT 8.0 7.2  ALBUMIN 2.8* 2.6*    CBG: No results for input(s): "GLUCAP" in the last 168 hours.   Recent Results (from the past 240 hour(s))  Urine Culture     Status: Abnormal   Collection Time: 08/02/23  6:04 PM   Specimen: Urine, Random  Result Value Ref Range Status   Specimen Description   Final    URINE, RANDOM Performed at [facility], [address] [place], [place] [address]    Special Requests   Final    NONE Reflexed from [id number] Performed at [facility], [address], [place], [place] [address]    Culture 70,000 COLONIES/mL YEAST (A)  Final   Report Status 08/03/2023 FINAL  Final         Radiology Studies: No results found.      Scheduled Meds:  carvedilol  3.125 mg Oral BID WC   cyanocobalamin  1,000 mcg Subcutaneous Daily   fluconazole   200 mg Oral Daily   isosorbide mononitrate  15 mg Oral Daily   rosuvastatin  40 mg Oral Daily   sertraline  50 mg Oral q morning   sodium bicarbonate  650 mg Oral BID   traZODone  150 mg  Oral QHS   Continuous Infusions:  sodium chloride Stopped (08/05/23 1110)   ceFEPime (MAXIPIME) IV Stopped (08/04/23 2239)     LOS: 3 days    Time spent: 40 minutes    Ramiro Harvest, MD [facility]   To contact the attending provider between 7A-7P or the covering provider during after hours 7P-7A, please log into the web site [email or web address] and access using universal [place] password for that web site. If you do not have the password, please call the hospital operator.  08/05/2023, 12:03 PM

## 2023-08-06 ENCOUNTER — Inpatient Hospital Stay (HOSPITAL_COMMUNITY): Payer: Medicare PPO

## 2023-08-06 DIAGNOSIS — I2510 Atherosclerotic heart disease of native coronary artery without angina pectoris: Secondary | ICD-10-CM | POA: Diagnosis not present

## 2023-08-06 DIAGNOSIS — D649 Anemia, unspecified: Secondary | ICD-10-CM | POA: Diagnosis not present

## 2023-08-06 DIAGNOSIS — N179 Acute kidney failure, unspecified: Secondary | ICD-10-CM | POA: Diagnosis not present

## 2023-08-06 DIAGNOSIS — E782 Mixed hyperlipidemia: Secondary | ICD-10-CM | POA: Diagnosis not present

## 2023-08-06 LAB — TYPE AND SCREEN
ABO/RH(D): O POS
Antibody Screen: NEGATIVE
Unit division: 0

## 2023-08-06 LAB — BPAM RBC
Blood Product Expiration Date: 202411262359
ISSUE DATE / TIME: 202410271108
Unit Type and Rh: 5100

## 2023-08-06 LAB — BASIC METABOLIC PANEL
Anion gap: 5 (ref 5–15)
BUN: 52 mg/dL — ABNORMAL HIGH (ref 8–23)
CO2: 19 mmol/L — ABNORMAL LOW (ref 22–32)
Calcium: 7.5 mg/dL — ABNORMAL LOW (ref 8.9–10.3)
Chloride: 114 mmol/L — ABNORMAL HIGH (ref 98–111)
Creatinine, Ser: 4.98 mg/dL — ABNORMAL HIGH (ref 0.44–1.00)
GFR, Estimated: 9 mL/min — ABNORMAL LOW (ref >60)
Glucose, Bld: 90 mg/dL (ref 70–99)
Potassium: 5 mmol/L (ref 3.5–5.1)
Sodium: 138 mmol/L (ref 135–145)

## 2023-08-06 LAB — CBC
HCT: 26.8 % — ABNORMAL LOW (ref 36.0–46.0)
Hemoglobin: 8.3 g/dL — ABNORMAL LOW (ref 12.0–15.0)
MCH: 29.5 pg (ref 26.0–34.0)
MCHC: 31 g/dL (ref 30.0–36.0)
MCV: 95.4 fL (ref 80.0–100.0)
Platelets: 197 10*3/uL (ref 150–400)
RBC: 2.81 MIL/uL — ABNORMAL LOW (ref 3.87–5.11)
RDW: 15.8 % — ABNORMAL HIGH (ref 11.5–15.5)
WBC: 8.4 10*3/uL (ref 4.0–10.5)
nRBC: 0 % (ref 0.0–0.2)

## 2023-08-06 MED ORDER — SODIUM CHLORIDE 0.45 % IV SOLN: INTRAVENOUS | Status: DC

## 2023-08-06 MED ORDER — SORBITOL 70 % SOLN
30.0000 mL | Status: AC
  Administered 2023-08-06 (×2): 30 mL via ORAL
  Filled 2023-08-06 (×2): qty 30

## 2023-08-06 MED ORDER — CEFADROXIL 500 MG PO CAPS
1000.0000 mg | ORAL_CAPSULE | Freq: Every day | ORAL | Status: AC
  Administered 2023-08-06: 1000 mg via ORAL
  Filled 2023-08-06: qty 2

## 2023-08-06 MED ORDER — FUROSEMIDE 10 MG/ML IJ SOLN
40.0000 mg | Freq: Once | INTRAMUSCULAR | Status: AC
  Administered 2023-08-06: 40 mg via INTRAVENOUS
  Filled 2023-08-06: qty 4

## 2023-08-06 MED ORDER — SENNOSIDES-DOCUSATE SODIUM 8.6-50 MG PO TABS
1.0000 | ORAL_TABLET | Freq: Two times a day (BID) | ORAL | Status: DC
  Administered 2023-08-06 – 2023-08-10 (×3): 1 via ORAL
  Filled 2023-08-06 (×5): qty 1

## 2023-08-06 NOTE — Progress Notes (Signed)
PROGRESS NOTE    [name]  ZOX:[id number] DOB: Jul 05, 1946 DOA: 08/02/2023 PCP: Shon Hale, MD    Chief Complaint  Patient presents with   Abnormal Labs    Brief Narrative:  77 y.o. female with medical history significant of CAD, bladder cancer, anemia, diastolic CHF, hx of PE 2022, CKD IV b/l creat ~2.6 on 06/18/23.presented with fatigue, hematuria, abnormal labs and was sent in the emergency department hematuria was seen by her primary care provider who noticed that she has acute renal failure with creatinine up to 5 up from baseline of 2 and progressive anemia with hemoglobin down to 9.7 down from 11 at baseline. Had a few episodes of nonbloody diarrhea last week Hemoccult negative in ER Not on any anticoagulation does has history of bladder cancer followed by urology Dr. Berneice Heinrich on 27 September had cystoscopy of left retrograde pyelogram and left ureteroscopy with laser ablation of the tumor with left ureteral stent placement. She was admitted for acute kidney injury in the setting of CKD, and Anemia . CXR: NAD CT Abd/pelvis: Left ureteral stent in expected location with persistent perinephric stranding, no hydronephrosis. Left para-aortic retroperitoneal adenopathy, increased since previous    Assessment & Plan:   Active Problems:   Symptomatic anemia   Essential hypertension   Mixed hyperlipidemia   Chronic combined systolic and diastolic CHF (congestive heart failure) (HCC)   Urothelial carcinoma (HCC)   Hypokalemia   CAD in native artery   AKI (acute kidney injury) (HCC)   Hyponatremia   UTI (urinary tract infection)   Candiduria   Acute renal failure superimposed on stage 4 chronic kidney disease (HCC)   Acute kidney injury (HCC)   #1 normocytic anemia -Patient presented with fatigue, generalized weakness, noted to have a hemoglobin of 9.2 on admission from 11.4(06/18/2023). -Hemoglobin currently at 8.3 today status post transfusion 1 unit PRBCs.   -Patient denies any overt bleeding. -Patient denied any overt hematuria however per admission H&P is noted that patient did have hematuria. -Urinalysis done was cloudy, large hemoglobin, large leukocytes with WBCs > 50. -FOBT done was negative. -Anemia panel with iron level of 13, TIBC of 197, ferritin of 425, folate of 8.0, vitamin B12 of 299. -Continue vitamin B12 1000 mcg subcu daily while in house and transition to oral vitamin B12 on discharge. -May need IV iron. -Follow H&H. -Transfusion threshold hemoglobin < 8. -Outpatient follow-up.  2.  Hypertension -Imdur, Coreg.    3.?  UTI/candiduria/urothelial cancer  -Patient being followed by urology and underwent ureteral stent placement in November 2024. -CT abdomen and pelvis with left ureteral stent in expected location with persistent perinephric stranding, no hydronephrosis.  Left para-aortic retroperitoneal adenopathy, increased since previous.  Aortic atherosclerosis.  Negative for hydronephrosis. -Patient noted to have a history of rare bacteria in the urine and started on meropenem due to prior resistant UTI. -Patient currently on IV cefepime. -Urinalysis done with budding yeast present which was also noted on prior urinalysis. -Urine cultures however with 70,000 colonies of yeast. -Continue IV Diflucan and treat empirically for 7 days due to history of longstanding high-grade bladder upper tract cancer status post right nephroureterectomy 2008 with ongoing left high-grade renal pelvis cancer since 2020 with most recent tumor ablation and stent placement September 2024. -Change IV cefepime to oral Duricef to complete a 5-day course of treatment.  -Will change IV Diflucan to oral Diflucan tomorrow. -Supportive care. -Urology consulted.  4.  Hyperlipidemia -Statin.  5.  Chronic combined systolic and diastolic  CHF -Patient initially on the dry side and not volume overloaded on examination. -Patient hydrated with IV fluids.    -Patient with some complaints of shortness of breath and wheezing, concern for excess volume as patient also transfused PRBCs.   -Will saline lock IV fluids.   -Lasix 40 mg IV x 1.   6.  CAD -Aspirin on hold due to presentation with anemia. -Continue Crestor, Coreg.    7.  Hyponatremia -Likely secondary to hypovolemic hyponatremia. -Improved with hydration. -Follow.  8.  AKI on CKD stage IV (baseline creatinine approximately 2.6) -Patient had presented with abnormal labs from PCPs office. -Creatinine on admission was 6.13 with last creatinine noted at 2.60 (06/18/2023). -Likely secondary to prerenal azotemia due to poor oral intake and dehydration. -CT abdomen and pelvis done negative for hydronephrosis. -Urine output of 2.05 L over the past 24 hours. -Patient improving clinically however some complaints of intermittent shortness of breath now as well as some intermittent wheezing. -Renal function slowly trending down creatinine currently at 4.98 from 5.24 from 6.13 on admission. -Patient on exam with concerns for possible volume overload, from aggressive IV fluids on admission and transfusion of PRBCs. -Chest x-ray. -Saline lock IV fluids. -Lasix 40 mg IV x 1. -Monitor urine output and renal function.  9.  Hypokalemia -Repleted. -Potassium of 5.    DVT prophylaxis: SCDs Code Status: Full Family Communication: Updated patient, brother and brother-in-law at bedside. Disposition: TBD  Status is: Inpatient Remains inpatient appropriate because: Severity of illness   Consultants:  Urology: Dr. Jennette Dubin 08/03/2023  Procedures:  CT abdomen and pelvis 08/02/2023 Chest x-ray 08/02/2023, 08/06/2023 Transfused 1 units PRBCs pending  Antimicrobials:  Anti-infectives (From admission, onward)    Start     Dose/Rate Route Frequency Ordered Stop   08/06/23 2200  cefadroxil (DURICEF) capsule 1,000 mg        1,000 mg Oral Daily at bedtime 08/06/23 0759 08/07/23 2159   08/05/23  1000  fluconazole (DIFLUCAN) tablet 200 mg        200 mg Oral Daily 08/04/23 1050     08/04/23 1000  fluconazole (DIFLUCAN) IVPB 200 mg  Status:  Discontinued        200 mg 100 mL/hr over 60 Minutes Intravenous Every 24 hours 08/04/23 0825 08/04/23 1050   08/04/23 0915  fluconazole (DIFLUCAN) IVPB 200 mg  Status:  Discontinued        200 mg 100 mL/hr over 60 Minutes Intravenous Every 24 hours 08/04/23 0818 08/04/23 0825   08/03/23 2200  meropenem (MERREM) 500 mg in sodium chloride 0.9 % 100 mL IVPB  Status:  Discontinued        500 mg 200 mL/hr over 30 Minutes Intravenous Every 24 hours 08/02/23 2307 08/03/23 1251   08/03/23 2200  ceFEPIme (MAXIPIME) 1 g in sodium chloride 0.9 % 100 mL IVPB  Status:  Discontinued        1 g 200 mL/hr over 30 Minutes Intravenous Every 24 hours 08/03/23 1251 08/06/23 0759   08/02/23 2100  meropenem (MERREM) 1 g in sodium chloride 0.9 % 100 mL IVPB        1 g 200 mL/hr over 30 Minutes Intravenous  Once 08/02/23 2040 08/03/23 0013   08/02/23 2030  ertapenem (INVANZ) 500 mg in sodium chloride 0.9 % 50 mL IVPB  Status:  Discontinued        500 mg 100 mL/hr over 30 Minutes Intravenous Every 24 hours 08/02/23 2026 08/02/23 2039  Subjective: Patient laying in bed with some complaints of intermittent wheezing.  Some intermittent shortness of breath.  Denies any chest pain.  No abdominal pain.  States has good urine output.  Weakness improving.  No overt bleeding.  Brother and brother-in-law at bedside.    Objective: Vitals:   08/05/23 1135 08/05/23 1424 08/05/23 2126 08/06/23 0432  BP: 107/63 (!) 111/58 (!) 167/62 (!) 141/60  Pulse: (!) 51 (!) 50 66 62  Resp: 16 16 18 16   Temp: 97.7 F (36.5 C) (!) 97.5 F (36.4 C) 97.8 F (36.6 C) 98.7 F (37.1 C)  TempSrc: Oral Oral Oral Oral  SpO2: 99% 99% 96% 95%  Weight:      Height:        Intake/Output Summary (Last 24 hours) at 08/06/2023 1051 Last data filed at 08/06/2023 0300 Gross per 24 hour   Intake 2401.49 ml  Output 1550 ml  Net 851.49 ml   Filed Weights   08/02/23 1614 08/05/23 0755  Weight: 76.7 kg 77 kg    Examination:  General exam: NAD.  Dry mucous membranes.  Respiratory system: Some diffuse scattered crackles.  Some decreased breath sounds in the bases.  No significant wheezing noted.  No rhonchi.  Fair air movement.  Speaking in full sentences.  Cardiovascular system: RRR no murmurs rubs or gallops.  No JVD.  No pitting lower extremity edema.  Gastrointestinal system: Abdomen is soft, nontender, nondistended, positive bowel sounds.  No rebound.  No guarding.   Central nervous system: Alert and oriented. No focal neurological deficits. Extremities: Symmetric 5 x 5 power. Skin: No rashes, lesions or ulcers Psychiatry: Judgement and insight appear normal. Mood & affect appropriate.     Data Reviewed: I have personally reviewed following labs and imaging studies  CBC: Recent Labs  Lab 08/02/23 1633 08/03/23 0400 08/04/23 0420 08/04/23 1543 08/05/23 0433 08/05/23 1618 08/06/23 0415  WBC 13.6* 12.6* 7.8 7.4 8.9  --  8.4  NEUTROABS 9.9*  --   --  4.7  --   --   --   HGB 9.2* 8.8* 7.9* 7.4* 7.4* 8.3* 8.3*  HCT 29.4* 27.7* 25.8* 23.9* 24.6* 27.0* 26.8*  MCV 91.6 92.3 94.9 93.7 95.7  --  95.4  PLT 254 229 214 196 204  --  197    Basic Metabolic Panel: Recent Labs  Lab 08/02/23 2233 08/03/23 0400 08/04/23 0420 08/05/23 0433 08/06/23 0415  NA 128* 133* 136 138 138  K 3.7 3.8 4.0 4.8 5.0  CL 102 105 109 113* 114*  CO2 18* 19* 19* 19* 19*  GLUCOSE 107* 109* 89 91 90  BUN 54* 53* 53* 52* 52*  CREATININE 6.00* 5.81* 5.54* 5.24* 4.98*  CALCIUM 7.9* 8.2* 7.9* 7.7* 7.5*  MG  --  1.8  --   --   --   PHOS  --  4.1  --   --   --     GFR: Estimated Creatinine Clearance: 9.6 mL/min (A) (by C-G formula based on SCr of 4.98 mg/dL (H)).  Liver Function Tests: Recent Labs  Lab 08/02/23 1633 08/03/23 0400  AST 10* 11*  ALT 11 11  ALKPHOS 62 56   BILITOT 0.6 0.4  PROT 8.0 7.2  ALBUMIN 2.8* 2.6*    CBG: No results for input(s): "GLUCAP" in the last 168 hours.   Recent Results (from the past 240 hour(s))  Urine Culture     Status: Abnormal   Collection Time: 08/02/23  6:04 PM   Specimen:  Urine, Random  Result Value Ref Range Status   Specimen Description   Final    URINE, RANDOM Performed at [facility], [address] [place], [place] [address]    Special Requests   Final    NONE Reflexed from [id number] Performed at [facility], [address], [place], [place] [address]    Culture 70,000 COLONIES/mL YEAST (A)  Final   Report Status 08/03/2023 FINAL  Final         Radiology Studies: No results found.      Scheduled Meds:  carvedilol  3.125 mg Oral BID WC   cefadroxil  1,000 mg Oral QHS   cyanocobalamin  1,000 mcg Subcutaneous Daily   fluconazole  200 mg Oral Daily   isosorbide mononitrate  15 mg Oral Daily   rosuvastatin  40 mg Oral Daily   sertraline  50 mg Oral q morning   sodium bicarbonate  650 mg Oral BID   traZODone  150 mg Oral QHS   Continuous Infusions:  sodium chloride 100 mL/hr at 08/06/23 0834     LOS: 4 days    Time spent: 40 minutes    Ramiro Harvest, MD [facility]   To contact the attending provider between 7A-7P or the covering provider during after hours 7P-7A, please log into the web site [email or web address] and access using universal [place] password for that web site. If you do not have the password, please call the hospital operator.  08/06/2023, 10:51 AM

## 2023-08-06 NOTE — Progress Notes (Signed)
Mobility Specialist - Progress Note  Pre-mobility: 57 bpm HR,  During mobility: 70 bpm HR,  Post-mobility: 52 bpm HR,    08/06/23 1333  Mobility  Activity Ambulated with assistance in hallway  Level of Assistance Modified independent, requires aide device or extra time  Assistive Device Front wheel walker  Distance Ambulated (ft) 350 ft  Range of Motion/Exercises Active  Activity Response Tolerated well  Mobility Referral Yes  $Mobility charge 1 Mobility  Mobility Specialist Start Time (ACUTE ONLY) 1320  Mobility Specialist Stop Time (ACUTE ONLY) 1333  Mobility Specialist Time Calculation (min) (ACUTE ONLY) 13 min   Pt was found in bed and agreeable to ambulate. No complaints with session. At EOS returned to bed with all needs met. Call bell in reach.  Billey Chang Mobility Specialist

## 2023-08-06 NOTE — Plan of Care (Signed)
  Problem: Education: Goal: Knowledge of General Education information will improve Description: Including pain rating scale, medication(s)/side effects and non-pharmacologic comfort measures Outcome: Adequate for Discharge   Problem: Health Behavior/Discharge Planning: Goal: Ability to manage health-related needs will improve Outcome: Adequate for Discharge   Problem: Clinical Measurements: Goal: Will remain free from infection Outcome: Adequate for Discharge Goal: Respiratory complications will improve Outcome: Adequate for Discharge Goal: Cardiovascular complication will be avoided Outcome: Adequate for Discharge   Problem: Activity: Goal: Risk for activity intolerance will decrease Outcome: Adequate for Discharge   Problem: Coping: Goal: Level of anxiety will decrease Outcome: Adequate for Discharge   Problem: Pain Management: Goal: General experience of comfort will improve Outcome: Adequate for Discharge   Problem: Safety: Goal: Ability to remain free from injury will improve Outcome: Adequate for Discharge   Problem: Skin Integrity: Goal: Risk for impaired skin integrity will decrease Outcome: Adequate for Discharge   Problem: Urinary Elimination: Goal: Signs and symptoms of infection will decrease Outcome: Adequate for Discharge   Problem: Clinical Measurements: Goal: Diagnostic test results will improve Outcome: Progressing   Problem: Nutrition: Goal: Adequate nutrition will be maintained Outcome: Progressing   Problem: Elimination: Goal: Will not experience complications related to bowel motility Outcome: Progressing

## 2023-08-07 DIAGNOSIS — D649 Anemia, unspecified: Secondary | ICD-10-CM | POA: Diagnosis not present

## 2023-08-07 DIAGNOSIS — I2510 Atherosclerotic heart disease of native coronary artery without angina pectoris: Secondary | ICD-10-CM | POA: Diagnosis not present

## 2023-08-07 DIAGNOSIS — N179 Acute kidney failure, unspecified: Secondary | ICD-10-CM | POA: Diagnosis not present

## 2023-08-07 DIAGNOSIS — E782 Mixed hyperlipidemia: Secondary | ICD-10-CM | POA: Diagnosis not present

## 2023-08-07 LAB — BASIC METABOLIC PANEL
Anion gap: 10 (ref 5–15)
BUN: 47 mg/dL — ABNORMAL HIGH (ref 8–23)
CO2: 19 mmol/L — ABNORMAL LOW (ref 22–32)
Calcium: 8.5 mg/dL — ABNORMAL LOW (ref 8.9–10.3)
Chloride: 112 mmol/L — ABNORMAL HIGH (ref 98–111)
Creatinine, Ser: 4.64 mg/dL — ABNORMAL HIGH (ref 0.44–1.00)
GFR, Estimated: 9 mL/min — ABNORMAL LOW (ref >60)
Glucose, Bld: 96 mg/dL (ref 70–99)
Potassium: 4.3 mmol/L (ref 3.5–5.1)
Sodium: 141 mmol/L (ref 135–145)

## 2023-08-07 LAB — CBC
HCT: 28.9 % — ABNORMAL LOW (ref 36.0–46.0)
Hemoglobin: 8.8 g/dL — ABNORMAL LOW (ref 12.0–15.0)
MCH: 28.4 pg (ref 26.0–34.0)
MCHC: 30.4 g/dL (ref 30.0–36.0)
MCV: 93.2 fL (ref 80.0–100.0)
Platelets: 206 10*3/uL (ref 150–400)
RBC: 3.1 MIL/uL — ABNORMAL LOW (ref 3.87–5.11)
RDW: 15.7 % — ABNORMAL HIGH (ref 11.5–15.5)
WBC: 8.5 10*3/uL (ref 4.0–10.5)
nRBC: 0 % (ref 0.0–0.2)

## 2023-08-07 MED ORDER — FUROSEMIDE 10 MG/ML IJ SOLN
40.0000 mg | Freq: Once | INTRAMUSCULAR | Status: AC
  Administered 2023-08-07: 40 mg via INTRAVENOUS
  Filled 2023-08-07: qty 4

## 2023-08-07 NOTE — Plan of Care (Signed)
  Problem: Health Behavior/Discharge Planning: Goal: Ability to manage health-related needs will improve Outcome: Progressing   Problem: Clinical Measurements: Goal: Ability to maintain clinical measurements within normal limits will improve Outcome: Progressing Goal: Will remain free from infection Outcome: Progressing Goal: Diagnostic test results will improve Outcome: Progressing   Problem: Activity: Goal: Risk for activity intolerance will decrease Outcome: Progressing   Problem: Nutrition: Goal: Adequate nutrition will be maintained Outcome: Progressing   Problem: Education: Goal: Knowledge of General Education information will improve Description: Including pain rating scale, medication(s)/side effects and non-pharmacologic comfort measures Outcome: Adequate for Discharge   Problem: Clinical Measurements: Goal: Respiratory complications will improve Outcome: Adequate for Discharge Goal: Cardiovascular complication will be avoided Outcome: Adequate for Discharge   Problem: Coping: Goal: Level of anxiety will decrease Outcome: Adequate for Discharge   Problem: Elimination: Goal: Will not experience complications related to bowel motility Outcome: Adequate for Discharge Goal: Will not experience complications related to urinary retention Outcome: Adequate for Discharge   Problem: Pain Management: Goal: General experience of comfort will improve Outcome: Adequate for Discharge   Problem: Safety: Goal: Ability to remain free from injury will improve Outcome: Adequate for Discharge   Problem: Skin Integrity: Goal: Risk for impaired skin integrity will decrease Outcome: Adequate for Discharge   Problem: Urinary Elimination: Goal: Signs and symptoms of infection will decrease Outcome: Adequate for Discharge

## 2023-08-07 NOTE — Progress Notes (Signed)
PROGRESS NOTE    [name]  KGM:[id number] DOB: Jul 02, 1946 DOA: 08/02/2023 PCP: Shon Hale, MD    Chief Complaint  Patient presents with   Abnormal Labs    Brief Narrative:  77 y.o. female with medical history significant of CAD, bladder cancer, anemia, diastolic CHF, hx of PE 2022, CKD IV b/l creat ~2.6 on 06/18/23.presented with fatigue, hematuria, abnormal labs and was sent in the emergency department hematuria was seen by her primary care provider who noticed that she has acute renal failure with creatinine up to 5 up from baseline of 2 and progressive anemia with hemoglobin down to 9.7 down from 11 at baseline. Had a few episodes of nonbloody diarrhea last week Hemoccult negative in ER Not on any anticoagulation does has history of bladder cancer followed by urology Dr. Berneice Heinrich on 27 September had cystoscopy of left retrograde pyelogram and left ureteroscopy with laser ablation of the tumor with left ureteral stent placement. She was admitted for acute kidney injury in the setting of CKD, and Anemia . CXR: NAD CT Abd/pelvis: Left ureteral stent in expected location with persistent perinephric stranding, no hydronephrosis. Left para-aortic retroperitoneal adenopathy, increased since previous    Assessment & Plan:   Active Problems:   Symptomatic anemia   Essential hypertension   Mixed hyperlipidemia   Chronic combined systolic and diastolic CHF (congestive heart failure) (HCC)   Urothelial carcinoma (HCC)   Hypokalemia   CAD in native artery   AKI (acute kidney injury) (HCC)   Hyponatremia   UTI (urinary tract infection)   Candiduria   Acute renal failure superimposed on stage 4 chronic kidney disease (HCC)   Acute kidney injury (HCC)   #1 normocytic anemia -Patient presented with fatigue, generalized weakness, noted to have a hemoglobin of 9.2 on admission from 11.4(06/18/2023). -Hemoglobin currently at 8.8 today status post transfusion 1 unit PRBCs.   -Patient denies any overt bleeding. -Patient denied any overt hematuria however per admission H&P is noted that patient did have hematuria. -Urinalysis done was cloudy, large hemoglobin, large leukocytes with WBCs > 50. -FOBT done was negative. -Anemia panel with iron level of 13, TIBC of 197, ferritin of 425, folate of 8.0, vitamin B12 of 299. -Continue vitamin B12 1000 mcg subcu daily while in house and transition to oral vitamin B12 on discharge. -May need IV iron. -Follow H&H. -Transfusion threshold hemoglobin < 8. -Outpatient follow-up.  2.  Hypertension -Imdur, Coreg.    3.?  UTI/candiduria/urothelial cancer  -Patient being followed by urology and underwent ureteral stent placement in November 2024. -CT abdomen and pelvis with left ureteral stent in expected location with persistent perinephric stranding, no hydronephrosis.  Left para-aortic retroperitoneal adenopathy, increased since previous.  Aortic atherosclerosis.  Negative for hydronephrosis. -Patient noted to have a history of rare bacteria in the urine and started on meropenem due to prior resistant UTI. -Patient was initially placed on IV cefepime and subsequently transition to [facility] to complete a 5-day course of treatment.  -Urinalysis done with budding yeast present which was also noted on prior urinalysis. -Urine cultures however with 70,000 colonies of yeast. -Patient started on IV Diflucan to treat empirically for 7 days due to history of longstanding high-grade bladder upper tract cancer status post right nephroureterectomy 2008 with ongoing left high-grade renal pelvis cancer since 2020 with most recent tumor ablation and stent placement September 2024. -IV Diflucan has been changed to oral Diflucan to complete course of treatment.   -Patient was seen in consultation by urology  early on in the hospitalization.  -Supportive care.   -Will need outpatient follow-up with urology.   4.  Hyperlipidemia -Statin.  5.   Chronic combined systolic and diastolic CHF -Patient initially on the dry side and not volume overloaded on examination. -Patient hydrated with IV fluids.   -Patient with some complaints of shortness of breath and wheezing, concern for excess volume as patient also transfused PRBCs.   -IV fluids have been saline locked.   -Patient received Lasix 40 mg IV x 1 on 08/06/2023 with a urine output of 3.3 L over the past 24 hours.   -Will give another dose of Lasix 40 mg IV x 1 today and monitor urine output.   6.  CAD -Aspirin on hold due to presentation with anemia. -Coreg, Crestor.   7.  Hyponatremia -Likely secondary to hypovolemic hyponatremia. -Improved with hydration. -Patient now with concerns of volume overload and has been placed on Lasix 40 mg IV x 1 today. -Follow.  8.  AKI on CKD stage IV (baseline creatinine approximately 2.6) -Patient had presented with abnormal labs from PCPs office. -Creatinine on admission was 6.13 with last creatinine noted at 2.60 (06/18/2023). -Likely secondary to prerenal azotemia due to poor oral intake and dehydration. -CT abdomen and pelvis done negative for hydronephrosis. -Urine output of 3.3 L over the past 24 hours after receiving a dose of Lasix 40 mg IV x 1 on 08/06/2023.. -Patient improving clinically however some complaints of intermittent shortness of breath now as well as some intermittent wheezing which improved with a dose of IV Lasix on 08/06/2023.. -Renal function slowly trending down creatinine currently at 4.64 from 4.98 from 5.24 from 6.13 on admission. -Patient on exam with concerns for possible volume overload, from aggressive IV fluids on admission and transfusion of PRBCs. -IV fluids have been saline locked. -Will give another dose of Lasix 40 mg IV x 1 today and reassess tomorrow. -Monitor urine output and renal function.  9.  Hypokalemia -Repleted. -Potassium of 4.3.    DVT prophylaxis: SCDs Code Status: Full Family  Communication: Updated patient, no family at bedside.  Disposition: TBD  Status is: Inpatient Remains inpatient appropriate because: Severity of illness   Consultants:  Urology: Dr. Jennette Dubin 08/03/2023  Procedures:  CT abdomen and pelvis 08/02/2023 Chest x-ray 08/02/2023, 08/06/2023 Transfused 1 units PRBCs pending  Antimicrobials:  Anti-infectives (From admission, onward)    Start     Dose/Rate Route Frequency Ordered Stop   08/06/23 2200  cefadroxil (DURICEF) capsule 1,000 mg        1,000 mg Oral Daily at bedtime 08/06/23 0759 08/06/23 2041   08/05/23 1000  fluconazole (DIFLUCAN) tablet 200 mg        200 mg Oral Daily 08/04/23 1050     08/04/23 1000  fluconazole (DIFLUCAN) IVPB 200 mg  Status:  Discontinued        200 mg 100 mL/hr over 60 Minutes Intravenous Every 24 hours 08/04/23 0825 08/04/23 1050   08/04/23 0915  fluconazole (DIFLUCAN) IVPB 200 mg  Status:  Discontinued        200 mg 100 mL/hr over 60 Minutes Intravenous Every 24 hours 08/04/23 0818 08/04/23 0825   08/03/23 2200  meropenem (MERREM) 500 mg in sodium chloride 0.9 % 100 mL IVPB  Status:  Discontinued        500 mg 200 mL/hr over 30 Minutes Intravenous Every 24 hours 08/02/23 2307 08/03/23 1251   08/03/23 2200  ceFEPIme (MAXIPIME) 1 g in sodium chloride 0.9 %  100 mL IVPB  Status:  Discontinued        1 g 200 mL/hr over 30 Minutes Intravenous Every 24 hours 08/03/23 1251 08/06/23 0759   08/02/23 2100  meropenem (MERREM) 1 g in sodium chloride 0.9 % 100 mL IVPB        1 g 200 mL/hr over 30 Minutes Intravenous  Once 08/02/23 2040 08/03/23 0013   08/02/23 2030  ertapenem (INVANZ) 500 mg in sodium chloride 0.9 % 50 mL IVPB  Status:  Discontinued        500 mg 100 mL/hr over 30 Minutes Intravenous Every 24 hours 08/02/23 2026 08/02/23 2039         Subjective: Patient lying in bed.  Patient states had to have oxygen last night due to shortness of breath.  States does not feel too well today.  Improvement with  wheezing.  Improvement with cough.  No chest pain.  No abdominal pain.  No bleeding.  Stated had significant urine output with IV Lasix yesterday and this morning.   Objective: Vitals:   08/07/23 0538 08/07/23 0801 08/07/23 0844 08/07/23 1155  BP:    132/60  Pulse:    (!) 57  Resp:      Temp:    97.6 F (36.4 C)  TempSrc:    Oral  SpO2:    97%  Weight: 79.6 kg 77.3 kg 77.3 kg   Height:        Intake/Output Summary (Last 24 hours) at 08/07/2023 1237 Last data filed at 08/07/2023 1158 Gross per 24 hour  Intake 290 ml  Output 3100 ml  Net -2810 ml   Filed Weights   08/07/23 0538 08/07/23 0801 08/07/23 0844  Weight: 79.6 kg 77.3 kg 77.3 kg    Examination:  General exam: NAD.   Respiratory system: Some decreased diffuse scattered crackles.  No wheezing.  Fair air movement.  Speaking in full sentences. Cardiovascular system: Regular rate rhythm no murmurs rubs or gallops.  No JVD.  No pitting lower extremity edema.  Gastrointestinal system: Abdomen is soft, nontender, nondistended, positive bowel sounds.  No rebound.  No guarding.   Central nervous system: Alert and oriented. No focal neurological deficits. Extremities: Symmetric 5 x 5 power. Skin: No rashes, lesions or ulcers Psychiatry: Judgement and insight appear normal. Mood & affect appropriate.     Data Reviewed: I have personally reviewed following labs and imaging studies  CBC: Recent Labs  Lab 08/02/23 1633 08/03/23 0400 08/04/23 0420 08/04/23 1543 08/05/23 0433 08/05/23 1618 08/06/23 0415 08/07/23 0402  WBC 13.6*   < > 7.8 7.4 8.9  --  8.4 8.5  NEUTROABS 9.9*  --   --  4.7  --   --   --   --   HGB 9.2*   < > 7.9* 7.4* 7.4* 8.3* 8.3* 8.8*  HCT 29.4*   < > 25.8* 23.9* 24.6* 27.0* 26.8* 28.9*  MCV 91.6   < > 94.9 93.7 95.7  --  95.4 93.2  PLT 254   < > 214 196 204  --  197 206   < > = values in this interval not displayed.    Basic Metabolic Panel: Recent Labs  Lab 08/03/23 0400 08/04/23 0420  08/05/23 0433 08/06/23 0415 08/07/23 0402  NA 133* 136 138 138 141  K 3.8 4.0 4.8 5.0 4.3  CL 105 109 113* 114* 112*  CO2 19* 19* 19* 19* 19*  GLUCOSE 109* 89 91 90 96  BUN 53* 53*  52* 52* 47*  CREATININE 5.81* 5.54* 5.24* 4.98* 4.64*  CALCIUM 8.2* 7.9* 7.7* 7.5* 8.5*  MG 1.8  --   --   --   --   PHOS 4.1  --   --   --   --     GFR: Estimated Creatinine Clearance: 10.3 mL/min (A) (by C-G formula based on SCr of 4.64 mg/dL (H)).  Liver Function Tests: Recent Labs  Lab 08/02/23 1633 08/03/23 0400  AST 10* 11*  ALT 11 11  ALKPHOS 62 56  BILITOT 0.6 0.4  PROT 8.0 7.2  ALBUMIN 2.8* 2.6*    CBG: No results for input(s): "GLUCAP" in the last 168 hours.   Recent Results (from the past 240 hour(s))  Urine Culture     Status: Abnormal   Collection Time: 08/02/23  6:04 PM   Specimen: Urine, Random  Result Value Ref Range Status   Specimen Description   Final    URINE, RANDOM Performed at [facility], [address] [place], [place] [address]    Special Requests   Final    NONE Reflexed from [id number] Performed at [facility], [address], [place], [place] [address]    Culture 70,000 COLONIES/mL YEAST (A)  Final   Report Status 08/03/2023 FINAL  Final         Radiology Studies: DG Chest 2 View  Result Date: 08/06/2023 CLINICAL DATA:  Shortness of breath. EXAM: CHEST - 2 VIEW COMPARISON:  08/02/2023 FINDINGS: Normal sized heart. Tortuous and partially calcified thoracic aorta. Clear lungs with normal vascularity. Mild peribronchial thickening. Mild thoracic spine degenerative changes. IMPRESSION: Mild bronchitic changes. Electronically Signed   By: Beckie Salts M.D.   On: 08/06/2023 15:55        Scheduled Meds:  carvedilol  3.125 mg Oral BID WC   cyanocobalamin  1,000 mcg Subcutaneous Daily   fluconazole  200 mg Oral Daily   isosorbide mononitrate  15 mg Oral Daily   rosuvastatin  40 mg Oral Daily   senna-docusate  1  tablet Oral BID   sertraline  50 mg Oral q morning   sodium bicarbonate  650 mg Oral BID   traZODone  150 mg Oral QHS   Continuous Infusions:     LOS: 5 days    Time spent: 40 minutes    Ramiro Harvest, MD [facility]   To contact the attending provider between 7A-7P or the covering provider during after hours 7P-7A, please log into the web site [email or web address] and access using universal [place] password for that web site. If you do not have the password, please call the hospital operator.  08/07/2023, 12:37 PM

## 2023-08-08 ENCOUNTER — Inpatient Hospital Stay (HOSPITAL_COMMUNITY): Payer: Medicare PPO

## 2023-08-08 DIAGNOSIS — N179 Acute kidney failure, unspecified: Secondary | ICD-10-CM | POA: Diagnosis not present

## 2023-08-08 LAB — BASIC METABOLIC PANEL
Anion gap: 12 (ref 5–15)
BUN: 45 mg/dL — ABNORMAL HIGH (ref 8–23)
CO2: 23 mmol/L (ref 22–32)
Calcium: 8.4 mg/dL — ABNORMAL LOW (ref 8.9–10.3)
Chloride: 103 mmol/L (ref 98–111)
Creatinine, Ser: 4.2 mg/dL — ABNORMAL HIGH (ref 0.44–1.00)
GFR, Estimated: 10 mL/min — ABNORMAL LOW (ref >60)
Glucose, Bld: 90 mg/dL (ref 70–99)
Potassium: 3.7 mmol/L (ref 3.5–5.1)
Sodium: 138 mmol/L (ref 135–145)

## 2023-08-08 LAB — CBC
HCT: 32.5 % — ABNORMAL LOW (ref 36.0–46.0)
Hemoglobin: 10.2 g/dL — ABNORMAL LOW (ref 12.0–15.0)
MCH: 28.7 pg (ref 26.0–34.0)
MCHC: 31.4 g/dL (ref 30.0–36.0)
MCV: 91.5 fL (ref 80.0–100.0)
Platelets: 240 10*3/uL (ref 150–400)
RBC: 3.55 MIL/uL — ABNORMAL LOW (ref 3.87–5.11)
RDW: 15.6 % — ABNORMAL HIGH (ref 11.5–15.5)
WBC: 7.6 10*3/uL (ref 4.0–10.5)
nRBC: 0 % (ref 0.0–0.2)

## 2023-08-08 NOTE — Progress Notes (Signed)
PROGRESS NOTE    [name]  WUJ:[id number] DOB: 1946-01-19 DOA: 08/02/2023 PCP: Shon Hale, MD   Brief Narrative: 78 y.o. female with medical history significant of CAD, bladder cancer, anemia, diastolic CHF, hx of PE 2022, CKD IV b/l creat ~2.6 on 06/18/23.presented with fatigue, hematuria, abnormal labs and was sent in the emergency department hematuria was seen by her primary care provider who noticed that she has acute renal failure with creatinine up to 5 up from baseline of 2 and progressive anemia with hemoglobin down to 9.7 down from 11 at baseline. Had a few episodes of nonbloody diarrhea last week Hemoccult negative in ER Not on any anticoagulation does has history of bladder cancer followed by urology Dr. Berneice Heinrich on 27 September had cystoscopy of left retrograde pyelogram and left ureteroscopy with laser ablation of the tumor with left ureteral stent placement. She was admitted for acute kidney injury in the setting of CKD, and Anemia . CXR: NAD CT Abd/pelvis: Left ureteral stent in expected location with persistent perinephric stranding, no hydronephrosis. Left para-aortic retroperitoneal adenopathy, increased since previous     Assessment & Plan:   Active Problems:   Symptomatic anemia   Essential hypertension   Mixed hyperlipidemia   Chronic combined systolic and diastolic CHF (congestive heart failure) (HCC)   Urothelial carcinoma (HCC)   Hypokalemia   CAD in native artery   AKI (acute kidney injury) (HCC)   Hyponatremia   UTI (urinary tract infection)   Candiduria   Acute renal failure superimposed on stage 4 chronic kidney disease (HCC)   Acute kidney injury (HCC)   AKI on CKD stage IV (baseline creatinine approximately 2.6) -Patient had presented with abnormal labs from PCPs office. -Creatinine on admission was 6.13 with last creatinine noted at 2.60 (06/18/2023). -Likely secondary to prerenal azotemia due to poor oral intake and dehydration. -CT  abdomen and pelvis done negative for hydronephrosis. -She received Lasix intermittently.. -Renal function slowly trending down creatinine currently at 4.20 from 4.64 from 4.98 from 5.24 from 6.13 on admission. -possible volume overload, from aggressive IV fluids on admission and transfusion of PRBCs. -IV fluids have been saline locked.     UTI/candiduria/urothelial cancer  -Patient being followed by urology and underwent ureteral stent placement in November 2024. -CT abdomen and pelvis with left ureteral stent in expected location with persistent perinephric stranding, no hydronephrosis.  Left para-aortic retroperitoneal adenopathy, increased since previous.  Aortic atherosclerosis.  Negative for hydronephrosis. -Patient noted to have a history of rare bacteria in the urine and started on meropenem due to prior resistant UTI. -Patient was initially placed on IV cefepime and subsequently transition to [facility] to complete a 5-day course of treatment.  -Urinalysis done with budding yeast present which was also noted on prior urinalysis. -Urine cultures however with 70,000 colonies of yeast. -Patient started on IV Diflucan to treat empirically for 7 days due to history of longstanding high-grade bladder upper tract cancer status post right nephroureterectomy 2008 with ongoing left high-grade renal pelvis cancer since 2020 with most recent tumor ablation and stent placement September 2024. -IV Diflucan has been changed to oral Diflucan to complete course of treatment.   -Patient was seen in consultation by urology early on in the hospitalization.  -Supportive care.   -Will need outpatient follow-up with urology.     Chronic combined systolic and diastolic CHF -Patient initially on the dry side and not volume overloaded on examination. -Patient hydrated with IV fluids.   -Patient with some complaints  of shortness of breath and wheezing, concern for excess volume as patient also transfused PRBCs.    -IV fluids have been saline locked.   -Patient received Lasix 40 mg IV x 1 on 08/06/2023 with a urine output of 3.3 L over the past 24 hours.   -Will give another dose of Lasix 40 mg IV x 1 10/29 -BP soft will hold further diuresis   CAD -Aspirin on hold due to presentation with anemia. -Coreg, Crestor.   Hypertension -Imdur, Coreg.   Nutrition Problem: Increased nutrient needs Etiology: cancer and cancer related treatments  Signs/Symptoms: estimated needs  Interventions: Magic cup, MVI  Estimated body mass index is 30.19 kg/m as calculated from the following:   Height as of this encounter: 5\' 3"  (1.6 m).   Weight as of this encounter: 77.3 kg.  DVT prophylaxis: scd Code Status: full Family Communication: none Disposition Plan:  Status is: Inpatient Remains inpatient appropriate because: aki  Consultants:  Urology: Dr. Jennette Dubin 08/03/2023   Procedures:  CT abdomen and pelvis 08/02/2023 Chest x-ray 08/02/2023, 08/06/2023 Transfused 1 units PRBCs pending  Subjective: Resting in bed denies nausea vomiting shortness of breath  Objective: Vitals:   08/07/23 2044 08/08/23 0500 08/08/23 0530 08/08/23 0933  BP: (!) 149/67  (!) 149/63 (!) 135/55  Pulse: (!) 51  (!) 56 60  Resp: 18  17 17   Temp: 97.8 F (36.6 C)  98 F (36.7 C) 98.1 F (36.7 C)  TempSrc: Oral  Oral Oral  SpO2: 97%  95% 95%  Weight:  77.3 kg    Height:        Intake/Output Summary (Last 24 hours) at 08/08/2023 1253 Last data filed at 08/08/2023 1249 Gross per 24 hour  Intake 480 ml  Output --  Net 480 ml   Filed Weights   08/07/23 0801 08/07/23 0844 08/08/23 0500  Weight: 77.3 kg 77.3 kg 77.3 kg    Examination:  General exam: Appears in no acute distress Respiratory system: Clear to auscultation. Respiratory effort normal. Cardiovascular system: S1 & S2 heard, RRR. No JVD, murmurs, rubs, gallops or clicks. No pedal edema. Gastrointestinal system: Abdomen is nondistended, soft and  nontender. No organomegaly or masses felt. Normal bowel sounds heard. Central nervous system: Alert and oriented. No focal neurological deficits. Extremities: Trace edema    Data Reviewed: I have personally reviewed following labs and imaging studies  CBC: Recent Labs  Lab 08/02/23 1633 08/03/23 0400 08/04/23 1543 08/05/23 0433 08/05/23 1618 08/06/23 0415 08/07/23 0402 08/08/23 0351  WBC 13.6*   < > 7.4 8.9  --  8.4 8.5 7.6  NEUTROABS 9.9*  --  4.7  --   --   --   --   --   HGB 9.2*   < > 7.4* 7.4* 8.3* 8.3* 8.8* 10.2*  HCT 29.4*   < > 23.9* 24.6* 27.0* 26.8* 28.9* 32.5*  MCV 91.6   < > 93.7 95.7  --  95.4 93.2 91.5  PLT 254   < > 196 204  --  197 206 240   < > = values in this interval not displayed.   Basic Metabolic Panel: Recent Labs  Lab 08/03/23 0400 08/04/23 0420 08/05/23 0433 08/06/23 0415 08/07/23 0402 08/08/23 0351  NA 133* 136 138 138 141 138  K 3.8 4.0 4.8 5.0 4.3 3.7  CL 105 109 113* 114* 112* 103  CO2 19* 19* 19* 19* 19* 23  GLUCOSE 109* 89 91 90 96 90  BUN 53* 53* 52*  52* 47* 45*  CREATININE 5.81* 5.54* 5.24* 4.98* 4.64* 4.20*  CALCIUM 8.2* 7.9* 7.7* 7.5* 8.5* 8.4*  MG 1.8  --   --   --   --   --   PHOS 4.1  --   --   --   --   --    GFR: Estimated Creatinine Clearance: 11.4 mL/min (A) (by C-G formula based on SCr of 4.2 mg/dL (H)). Liver Function Tests: Recent Labs  Lab 08/02/23 1633 08/03/23 0400  AST 10* 11*  ALT 11 11  ALKPHOS 62 56  BILITOT 0.6 0.4  PROT 8.0 7.2  ALBUMIN 2.8* 2.6*   No results for input(s): "LIPASE", "AMYLASE" in the last 168 hours. No results for input(s): "AMMONIA" in the last 168 hours. Coagulation Profile: Recent Labs  Lab 08/02/23 1958  INR 1.3*   Cardiac Enzymes: Recent Labs  Lab 08/03/23 0400  CKTOTAL 33*   BNP (last 3 results) No results for input(s): "PROBNP" in the last 8760 hours. HbA1C: No results for input(s): "HGBA1C" in the last 72 hours. CBG: No results for input(s): "GLUCAP" in the  last 168 hours. Lipid Profile: No results for input(s): "CHOL", "HDL", "LDLCALC", "TRIG", "CHOLHDL", "LDLDIRECT" in the last 72 hours. Thyroid Function Tests: No results for input(s): "TSH", "T4TOTAL", "FREET4", "T3FREE", "THYROIDAB" in the last 72 hours. Anemia Panel: No results for input(s): "VITAMINB12", "FOLATE", "FERRITIN", "TIBC", "IRON", "RETICCTPCT" in the last 72 hours. Sepsis Labs: Recent Labs  Lab 08/03/23 0400  PROCALCITON 0.16    Recent Results (from the past 240 hour(s))  Urine Culture     Status: Abnormal   Collection Time: 08/02/23  6:04 PM   Specimen: Urine, Random  Result Value Ref Range Status   Specimen Description   Final    URINE, RANDOM Performed at [facility], [address], [place], [place] [address]    Special Requests   Final    NONE Reflexed from [id number] Performed at [facility], [address], [place], [place] [address]    Culture 70,000 COLONIES/mL YEAST (A)  Final   Report Status 08/03/2023 FINAL  Final     Radiology Studies: No results found.   Scheduled Meds:  carvedilol  3.125 mg Oral BID WC   cyanocobalamin  1,000 mcg Subcutaneous Daily   fluconazole  200 mg Oral Daily   isosorbide mononitrate  15 mg Oral Daily   rosuvastatin  40 mg Oral Daily   senna-docusate  1 tablet Oral BID   sertraline  50 mg Oral q morning   sodium bicarbonate  650 mg Oral BID   traZODone  150 mg Oral QHS   Continuous Infusions:   LOS: 6 days    Time spent: 39 min Alwyn Ren, MD  08/08/2023, 12:53 PM

## 2023-08-08 NOTE — Progress Notes (Signed)
Mobility Specialist - Progress Note   08/08/23 1518  Mobility  Activity Ambulated with assistance in hallway;Ambulated independently to bathroom  Level of Assistance Modified independent, requires aide device or extra time  Assistive Device Front wheel walker  Distance Ambulated (ft) 350 ft  Activity Response Tolerated well  Mobility Referral Yes  $Mobility charge 1 Mobility  Mobility Specialist Start Time (ACUTE ONLY) 0305  Mobility Specialist Stop Time (ACUTE ONLY) 0316  Mobility Specialist Time Calculation (min) (ACUTE ONLY) 11 min   Pt received in bed and agreeable to mobility. No complaints during session. Upon returning to room, pt ambulated to bathroom w/ out the need of an AD. Pt to bed after session with all needs met.    [facility]

## 2023-08-09 DIAGNOSIS — N179 Acute kidney failure, unspecified: Secondary | ICD-10-CM | POA: Diagnosis not present

## 2023-08-09 LAB — BASIC METABOLIC PANEL
Anion gap: 10 (ref 5–15)
BUN: 45 mg/dL — ABNORMAL HIGH (ref 8–23)
CO2: 27 mmol/L (ref 22–32)
Calcium: 8.5 mg/dL — ABNORMAL LOW (ref 8.9–10.3)
Chloride: 100 mmol/L (ref 98–111)
Creatinine, Ser: 3.85 mg/dL — ABNORMAL HIGH (ref 0.44–1.00)
GFR, Estimated: 12 mL/min — ABNORMAL LOW (ref >60)
Glucose, Bld: 124 mg/dL — ABNORMAL HIGH (ref 70–99)
Potassium: 3.9 mmol/L (ref 3.5–5.1)
Sodium: 137 mmol/L (ref 135–145)

## 2023-08-09 NOTE — Progress Notes (Signed)
Nutrition Follow-up  DOCUMENTATION CODES:   Not applicable  INTERVENTION:  - Heart Healthy diet per MD. - Mighty Shake BID with meals, each supplement provides 330 kcals and 9 grams of protein - Encourage intake.  - Monitor weight trends.   NUTRITION DIAGNOSIS:   Increased nutrient needs related to cancer and cancer related treatments as evidenced by estimated needs. *ongoing  GOAL:   Patient will meet greater than or equal to 90% of their needs *progressing  MONITOR:   PO intake, Supplement acceptance, Labs, Weight trends, I & O's  REASON FOR ASSESSMENT:   Consult Assessment of nutrition requirement/status  ASSESSMENT:   77 y.o. female with medical history significant of CAD, bladder cancer, anemia, diastolic CHF, hx of PE 2022. Admitted for AKI and anemia.  Patient reports her intake has improved over the past few days. She endorses trying to eat 3 meals a day but only a portion of them. She is documented to be consuming 48% of meals over the past 4 days however intake has notably improved over the past 1-2 days.  Appetite is slowly improving as well. She does not like [employer] and does not want to try Ensure or Boost Breeze. Agreeable to try Mighty Shake instead.    Admit weight: 169# Current weight: 169#  Medications reviewed and include: vitamin B12, Senokot  Labs reviewed:  Creatinine 3.85 HA1C 6.1   Diet Order:   Diet Order             Diet Heart Room service appropriate? Yes; Fluid consistency: Thin  Diet effective now                   EDUCATION NEEDS:  Education needs have been addressed  Skin:  Skin Assessment: Reviewed RN Assessment  Last BM:  10/29  Height:  Ht Readings from Last 1 Encounters:  08/05/23 5\' 3"  (1.6 m)   Weight:  Wt Readings from Last 1 Encounters:  08/09/23 76.8 kg    BMI:  Body mass index is 29.99 kg/m.  Estimated Nutritional Needs:  Kcal:  1550-1750 Protein:  70-85g Fluid:   1.8L/day    Shelle Iron RD, LDN For contact information, refer to [facility].

## 2023-08-09 NOTE — Progress Notes (Signed)
PROGRESS NOTE    [name]  IEP:[id number] DOB: [birth year] DOA: 08/02/2023 PCP: Shon Hale, MD   Brief Narrative: 77 y.o. female with medical history significant of CAD, bladder cancer, anemia, diastolic CHF, hx of PE 2022, CKD IV b/l creat ~2.6 on 06/18/23.presented with fatigue, hematuria, abnormal labs and was sent in the emergency department hematuria was seen by her primary care provider who noticed that she has acute renal failure with creatinine up to 5 up from baseline of 2 and progressive anemia with hemoglobin down to 9.7 down from 11 at baseline. Had a few episodes of nonbloody diarrhea last week Hemoccult negative in ER Not on any anticoagulation does has history of bladder cancer followed by urology Dr. Berneice Heinrich on 27 September had cystoscopy of left retrograde pyelogram and left ureteroscopy with laser ablation of the tumor with left ureteral stent placement. She was admitted for acute kidney injury in the setting of CKD, and Anemia . CXR: NAD CT Abd/pelvis: Left ureteral stent in expected location with persistent perinephric stranding, no hydronephrosis. Left para-aortic retroperitoneal adenopathy, increased since previous     Assessment & Plan:   Active Problems:   Symptomatic anemia   Essential hypertension   Mixed hyperlipidemia   Chronic combined systolic and diastolic CHF (congestive heart failure) (HCC)   Urothelial carcinoma (HCC)   Hypokalemia   CAD in native artery   AKI (acute kidney injury) (HCC)   Hyponatremia   UTI (urinary tract infection)   Candiduria   Acute renal failure superimposed on stage 4 chronic kidney disease (HCC)   Acute kidney injury (HCC)   AKI on CKD stage IV (baseline creatinine approximately 2.6) -Patient had presented with abnormal labs from PCPs office. -Creatinine on admission was 6.13 with last creatinine noted at 2.60 (06/18/2023). -Likely secondary to prerenal azotemia due to poor oral intake and dehydration. -CT  abdomen and pelvis done negative for hydronephrosis. -She received Lasix intermittently.. -Renal function slowly trending down creatinine currently at 3.85 from 4.20 from 4.64 from 4.98 from 5.24 from 6.13 on admission. -If creatinine improves will plan for discharge home tomorrow and follow-up with nephrology.    UTI/candiduria/urothelial cancer  -Patient being followed by urology and underwent ureteral stent placement in November 2024. -CT abdomen and pelvis with left ureteral stent in expected location with persistent perinephric stranding, no hydronephrosis.  Left para-aortic retroperitoneal adenopathy, increased since previous.  Aortic atherosclerosis.  Negative for hydronephrosis. -Patient noted to have a history of rare bacteria in the urine and started on meropenem due to prior resistant UTI. -Patient was initially placed on IV cefepime and subsequently transition to [facility] to complete a 5-day course of treatment.  -Urinalysis done with budding yeast present which was also noted on prior urinalysis. -Urine cultures however with 70,000 colonies of yeast. -Patient started on IV Diflucan to treat empirically for 7 days due to history of longstanding high-grade bladder upper tract cancer status post right nephroureterectomy 2008 with ongoing left high-grade renal pelvis cancer since 2020 with most recent tumor ablation and stent placement September 2024. -IV Diflucan has been changed to oral Diflucan to complete course of treatment.   -Patient was seen in consultation by urology early on in the hospitalization.  -Supportive care.   -Will need outpatient follow-up with urology.     Chronic combined systolic and diastolic CHF -Patient initially on the dry side and not volume overloaded on examination. -Patient hydrated with IV fluids.   -Patient with some complaints of shortness of breath and  wheezing, concern for excess volume as patient also transfused PRBCs.   -IV fluids have been saline  locked.   -Patient received Lasix 40 mg IV x 1 on 08/06/2023 with a urine output of 3.3 L over the past 24 hours.   -Will give another dose of Lasix 40 mg IV x 1 10/29 -BP soft will hold further diuresis   CAD -Aspirin on hold due to presentation with anemia. -Coreg, Crestor.   Hypertension -Imdur, Coreg.   Nutrition Problem: Increased nutrient needs Etiology: cancer and cancer related treatments  Signs/Symptoms: estimated needs  Interventions: Magic cup, MVI  Estimated body mass index is 29.99 kg/m as calculated from the following:   Height as of this encounter: 5\' 3"  (1.6 m).   Weight as of this encounter: 76.8 kg.  DVT prophylaxis: scd Code Status: full Family Communication: none Disposition Plan:  Status is: Inpatient Remains inpatient appropriate because: aki  Consultants:  Urology: Dr. Jennette Dubin 08/03/2023   Procedures:  CT abdomen and pelvis 08/02/2023 Chest x-ray 08/02/2023, 08/06/2023 Transfused 1 units PRBCs pending  Subjective: Denies shortness of breath or chest pain nausea vomiting  Objective: Vitals:   08/08/23 1940 08/09/23 0354 08/09/23 0500 08/09/23 1244  BP: 118/60 (!) 147/60  116/78  Pulse: 64 (!) 59  98  Resp: 17 16  20   Temp: 98.7 F (37.1 C) 98.2 F (36.8 C)  97.8 F (36.6 C)  TempSrc: Oral Oral  Oral  SpO2: 94% 94%    Weight:   76.8 kg   Height:        Intake/Output Summary (Last 24 hours) at 08/09/2023 1712 Last data filed at 08/09/2023 1245 Gross per 24 hour  Intake 240 ml  Output --  Net 240 ml   Filed Weights   08/07/23 0844 08/08/23 0500 08/09/23 0500  Weight: 77.3 kg 77.3 kg 76.8 kg    Examination:  General exam: Appears in no acute distress Respiratory system: Clear to auscultation. Respiratory effort normal. Cardiovascular system: S1 & S2 heard, RRR. No JVD, murmurs, rubs, gallops or clicks. No pedal edema. Gastrointestinal system: Abdomen is nondistended, soft and nontender. No organomegaly or masses felt. Normal  bowel sounds heard. Central nervous system: Alert and oriented. No focal neurological deficits. Extremities: Trace edema    Data Reviewed: I have personally reviewed following labs and imaging studies  CBC: Recent Labs  Lab 08/04/23 1543 08/05/23 0433 08/05/23 1618 08/06/23 0415 08/07/23 0402 08/08/23 0351  WBC 7.4 8.9  --  8.4 8.5 7.6  NEUTROABS 4.7  --   --   --   --   --   HGB 7.4* 7.4* 8.3* 8.3* 8.8* 10.2*  HCT 23.9* 24.6* 27.0* 26.8* 28.9* 32.5*  MCV 93.7 95.7  --  95.4 93.2 91.5  PLT 196 204  --  197 206 240   Basic Metabolic Panel: Recent Labs  Lab 08/03/23 0400 08/04/23 0420 08/05/23 0433 08/06/23 0415 08/07/23 0402 08/08/23 0351 08/09/23 0840  NA 133*   < > 138 138 141 138 137  K 3.8   < > 4.8 5.0 4.3 3.7 3.9  CL 105   < > 113* 114* 112* 103 100  CO2 19*   < > 19* 19* 19* 23 27  GLUCOSE 109*   < > 91 90 96 90 124*  BUN 53*   < > 52* 52* 47* 45* 45*  CREATININE 5.81*   < > 5.24* 4.98* 4.64* 4.20* 3.85*  CALCIUM 8.2*   < > 7.7* 7.5* 8.5* 8.4*  8.5*  MG 1.8  --   --   --   --   --   --   PHOS 4.1  --   --   --   --   --   --    < > = values in this interval not displayed.   GFR: Estimated Creatinine Clearance: 12.4 mL/min (A) (by C-G formula based on SCr of 3.85 mg/dL (H)). Liver Function Tests: Recent Labs  Lab 08/03/23 0400  AST 11*  ALT 11  ALKPHOS 56  BILITOT 0.4  PROT 7.2  ALBUMIN 2.6*   No results for input(s): "LIPASE", "AMYLASE" in the last 168 hours. No results for input(s): "AMMONIA" in the last 168 hours. Coagulation Profile: Recent Labs  Lab 08/02/23 1958  INR 1.3*   Cardiac Enzymes: Recent Labs  Lab 08/03/23 0400  CKTOTAL 33*   BNP (last 3 results) No results for input(s): "PROBNP" in the last 8760 hours. HbA1C: No results for input(s): "HGBA1C" in the last 72 hours. CBG: No results for input(s): "GLUCAP" in the last 168 hours. Lipid Profile: No results for input(s): "CHOL", "HDL", "LDLCALC", "TRIG", "CHOLHDL",  "LDLDIRECT" in the last 72 hours. Thyroid Function Tests: No results for input(s): "TSH", "T4TOTAL", "FREET4", "T3FREE", "THYROIDAB" in the last 72 hours. Anemia Panel: No results for input(s): "VITAMINB12", "FOLATE", "FERRITIN", "TIBC", "IRON", "RETICCTPCT" in the last 72 hours. Sepsis Labs: Recent Labs  Lab 08/03/23 0400  PROCALCITON 0.16    Recent Results (from the past 240 hour(s))  Urine Culture     Status: Abnormal   Collection Time: 08/02/23  6:04 PM   Specimen: Urine, Random  Result Value Ref Range Status   Specimen Description   Final    URINE, RANDOM Performed at [facility], [address] [place], [place] [address]    Special Requests   Final    NONE Reflexed from [id number] Performed at [facility], [address], [place], [place] [address]    Culture 70,000 COLONIES/mL YEAST (A)  Final   Report Status 08/03/2023 FINAL  Final     Radiology Studies: DG Chest 1 View  Result Date: 08/08/2023 CLINICAL DATA:  Cough EXAM: CHEST  1 VIEW COMPARISON:  08/06/2023 FINDINGS: Normal heart size and pulmonary vascularity. No focal airspace disease or consolidation in the lungs. No blunting of costophrenic angles. No pneumothorax. Mediastinal contours appear intact. Calcification of the aorta. Degenerative changes in the spine and shoulders. IMPRESSION: No active disease. Electronically Signed   By: Burman Nieves M.D.   On: 08/08/2023 17:33     Scheduled Meds:  carvedilol  3.125 mg Oral BID WC   cyanocobalamin  1,000 mcg Subcutaneous Daily   fluconazole  200 mg Oral Daily   isosorbide mononitrate  15 mg Oral Daily   rosuvastatin  40 mg Oral Daily   senna-docusate  1 tablet Oral BID   sertraline  50 mg Oral q morning   sodium bicarbonate  650 mg Oral BID   traZODone  150 mg Oral QHS   Continuous Infusions:   LOS: 7 days    Time spent: 39 min Alwyn Ren, MD  08/09/2023, 5:12 PM

## 2023-08-09 NOTE — Plan of Care (Signed)
  Problem: Clinical Measurements: Goal: Ability to maintain clinical measurements within normal limits will improve Outcome: Progressing Goal: Diagnostic test results will improve Outcome: Progressing   Problem: Elimination: Goal: Will not experience complications related to urinary retention Outcome: Progressing   Problem: Education: Goal: Knowledge of General Education information will improve Description: Including pain rating scale, medication(s)/side effects and non-pharmacologic comfort measures Outcome: Adequate for Discharge   Problem: Health Behavior/Discharge Planning: Goal: Ability to manage health-related needs will improve Outcome: Adequate for Discharge   Problem: Clinical Measurements: Goal: Will remain free from infection Outcome: Adequate for Discharge Goal: Respiratory complications will improve Outcome: Adequate for Discharge Goal: Cardiovascular complication will be avoided Outcome: Adequate for Discharge   Problem: Activity: Goal: Risk for activity intolerance will decrease Outcome: Adequate for Discharge   Problem: Nutrition: Goal: Adequate nutrition will be maintained Outcome: Adequate for Discharge   Problem: Coping: Goal: Level of anxiety will decrease Outcome: Adequate for Discharge   Problem: Elimination: Goal: Will not experience complications related to bowel motility Outcome: Adequate for Discharge   Problem: Pain Management: Goal: General experience of comfort will improve Outcome: Adequate for Discharge   Problem: Safety: Goal: Ability to remain free from injury will improve Outcome: Adequate for Discharge   Problem: Skin Integrity: Goal: Risk for impaired skin integrity will decrease Outcome: Adequate for Discharge   Problem: Urinary Elimination: Goal: Signs and symptoms of infection will decrease Outcome: Adequate for Discharge

## 2023-08-10 DIAGNOSIS — N179 Acute kidney failure, unspecified: Secondary | ICD-10-CM | POA: Diagnosis not present

## 2023-08-10 LAB — BASIC METABOLIC PANEL
Anion gap: 13 (ref 5–15)
BUN: 46 mg/dL — ABNORMAL HIGH (ref 8–23)
CO2: 27 mmol/L (ref 22–32)
Calcium: 8.6 mg/dL — ABNORMAL LOW (ref 8.9–10.3)
Chloride: 101 mmol/L (ref 98–111)
Creatinine, Ser: 3.53 mg/dL — ABNORMAL HIGH (ref 0.44–1.00)
GFR, Estimated: 13 mL/min — ABNORMAL LOW (ref >60)
Glucose, Bld: 104 mg/dL — ABNORMAL HIGH (ref 70–99)
Potassium: 4.2 mmol/L (ref 3.5–5.1)
Sodium: 141 mmol/L (ref 135–145)

## 2023-08-10 NOTE — Progress Notes (Signed)
At this time pt has no orders requiring PIV access. Advised unit RN to reach out to MD for verification that PIV access is necessary. If MD feels that PIV access is needed, unit RN will place consult to IV team

## 2023-08-10 NOTE — Discharge Summary (Signed)
Physician Discharge Summary  [name] ZOX:[id number] DOB: March 31, 1946 DOA: 08/02/2023  PCP: Shon Hale, MD  Admit date: 08/02/2023 Discharge date: 08/10/2023  Admitted From: home Disposition: home  Recommendations for Outpatient Follow-up:  Follow up with PCP in 1-2 weeks Please obtain BMP/CBC in one week Please follow up with dr Ronalee Belts Home Health:none Equipment/Devices:none  Discharge Condition:stable CODE STATUS:full Diet recommendation: cardiac/renal  Brief/Interim Summary: 77 y.o. female with medical history significant of CAD, bladder cancer, anemia, diastolic CHF, hx of PE 2022, CKD IV b/l creat ~2.6 on 06/18/23.presented with fatigue, hematuria, abnormal labs and was sent in the emergency department by her primary care provider who noticed that she has acute renal failure with creatinine up to 5 up from baseline of 2 and progressive anemia with hemoglobin down to 9.7 down from 11 at baseline. Had a few episodes of nonbloody diarrhea Hemoccult negative in ER Not on any anticoagulation does has history of bladder cancer followed by urology Dr. Berneice Heinrich on 27 September had cystoscopy of left retrograde pyelogram and left ureteroscopy with laser ablation of the tumor with left ureteral stent placement. She was admitted for acute kidney injury in the setting of CKD, and Anemia . CXR: NAD CT Abd/pelvis: Left ureteral stent in expected location with persistent perinephric stranding, no hydronephrosis. Left para-aortic retroperitoneal adenopathy, increased since previous   Discharge Diagnoses:  Active Problems:   Symptomatic anemia   Essential hypertension   Mixed hyperlipidemia   Chronic combined systolic and diastolic CHF (congestive heart failure) (HCC)   Urothelial carcinoma (HCC)   Hypokalemia   CAD in native artery   AKI (acute kidney injury) (HCC)   Hyponatremia   UTI (urinary tract infection)   Candiduria   Acute renal failure superimposed on stage 4  chronic kidney disease (HCC)   Acute kidney injury (HCC)  AKI on CKD stage IV (baseline creatinine approximately 2.6) -Patient had presented with abnormal labs from PCPs office. -Creatinine on admission was 6.13 with last creatinine noted at 2.60 (06/18/2023). -Likely secondary to prerenal azotemia due to poor oral intake and dehydration. -CT abdomen and pelvis done negative for hydronephrosis.  She was treated with IV fluids with improvement in her creatinine.  On the day of discharge her creatinine was 3.53. -Renal function slowly trending down creatinine currently at  3.53 from 3.85 from 4.20 from 4.64 from 4.98 from 5.24 from 6.13 on admission. -If creatinine improves will plan for discharge home tomorrow and follow-up with nephrology. She received 1 unit of blood for hemoglobin of 7.4 with improvement in open to 10.2 on discharge.   UTI/candiduria/urothelial cancer  -Patient being followed by urology and underwent ureteral stent placement in November 2024. -CT abdomen and pelvis with left ureteral stent in expected location with persistent perinephric stranding, no hydronephrosis.  Left para-aortic retroperitoneal adenopathy, increased since previous.  Aortic atherosclerosis.  Negative for hydronephrosis. -Patient noted to have a history of rare bacteria in the urine and started on meropenem due to prior resistant UTI. -Patient was initially placed on IV cefepime and subsequently transition to [facility] to complete a 5-day course of treatment.  -Urinalysis done with budding yeast present which was also noted on prior urinalysis. -Urine cultures however with 70,000 colonies of yeast. -Patient started on IV Diflucan to treat empirically for 7 days due to history of longstanding high-grade bladder upper tract cancer status post right nephroureterectomy 2008 with ongoing left high-grade renal pelvis cancer since 2020 with most recent tumor ablation and stent placement September 2024.  She completed a  course of Diflucan prior to discharge. -Patient was seen in consultation by urology early on in the hospitalization.    -Will need outpatient follow-up with urology.      Chronic combined systolic and diastolic CHF she received volume resuscitation due to dehydration and hypotension.  When she does have some complaints of shortness of breath and wheezing IV fluids were stopped she received 2 doses of Lasix 40 mg x 2.    CAD continue aspirin -Coreg, Crestor.     Hypertension -Imdur, Coreg.   Nutrition Problem: Increased nutrient needs Etiology: cancer and cancer related treatments Signs/Symptoms: estimated needs  Interventions: Magic cup, MVI  Estimated body mass index is 29.41 kg/m as calculated from the following:   Height as of this encounter: 5\' 3"  (1.6 m).   Weight as of this encounter: 75.3 kg.  Discharge Instructions  Discharge Instructions     Diet - low sodium heart healthy   Complete by: As directed    Increase activity slowly   Complete by: As directed       Allergies as of 08/10/2023       Reactions   Erythromycin Diarrhea, Nausea And Vomiting   Lotensin [benazepril Hcl] Cough   Macrodantin [nitrofurantoin] Diarrhea   And night sweats        Medication List     TAKE these medications    acetaminophen 500 MG tablet Commonly known as: TYLENOL Take 500-1,000 mg by mouth every 6 (six) hours as needed for mild pain.   aspirin EC 81 MG tablet Take 1 tablet (81 mg total) by mouth daily. Swallow whole.   calcium carbonate 750 MG chewable tablet Commonly known as: TUMS EX Chew 1 tablet by mouth every morning.   carvedilol 6.25 MG tablet Commonly known as: COREG TAKE 1 TABLET(6.25 MG) BY MOUTH TWICE DAILY WITH A MEAL What changed: See the new instructions.   cholecalciferol 25 MCG (1000 UNIT) tablet Commonly known as: VITAMIN D3 Take 1,000 Units by mouth every morning.   icosapent Ethyl 1 g capsule Commonly known as: VASCEPA Take 1 capsule (1 g  total) by mouth 2 (two) times daily.   IRON PO Take 1 tablet by mouth in the morning.   isosorbide mononitrate 30 MG 24 hr tablet Commonly known as: IMDUR TAKE 1/2 TABLET(15 MG) BY MOUTH DAILY   oxyCODONE 5 MG immediate release tablet Commonly known as: Roxicodone Take 1 tablet (5 mg total) by mouth every 6 (six) hours as needed for moderate pain or severe pain (post-operatively).   promethazine 12.5 MG tablet Commonly known as: PHENERGAN Take 12.5 mg by mouth every 12 (twelve) hours as needed.   rosuvastatin 40 MG tablet Commonly known as: CRESTOR TAKE 1 TABLET(40 MG) BY MOUTH DAILY   senna-docusate 8.6-50 MG tablet Commonly known as: Senokot-S Take 1 tablet by mouth 2 (two) times daily. While taking strong pain meds to prevent constipation.   sertraline 50 MG tablet Commonly known as: ZOLOFT Take 50 mg by mouth every morning.   traZODone 150 MG tablet Commonly known as: DESYREL Take 150 mg by mouth at bedtime.        Follow-up Information     Shon Hale, MD Follow up.   Specialty: Family Medicine Contact information: [address] [place] [address] [phone]         Maxie Barb, MD Follow up.   Specialties: Nephrology, Internal Medicine Contact information: [address] [place] [address] [phone]  Allergies  Allergen Reactions   Erythromycin Diarrhea and Nausea And Vomiting   Lotensin [Benazepril Hcl] Cough   Macrodantin [Nitrofurantoin] Diarrhea    And night sweats    Consultations: Discussed with nephrology on-call Dr. Arlean Hopping   Procedures/Studies: DG Chest 1 View  Result Date: 08/08/2023 CLINICAL DATA:  Cough EXAM: CHEST  1 VIEW COMPARISON:  08/06/2023 FINDINGS: Normal heart size and pulmonary vascularity. No focal airspace disease or consolidation in the lungs. No blunting of costophrenic angles. No pneumothorax. Mediastinal contours appear intact. Calcification of the aorta.  Degenerative changes in the spine and shoulders. IMPRESSION: No active disease. Electronically Signed   By: Burman Nieves M.D.   On: 08/08/2023 17:33   DG Chest 2 View  Result Date: 08/06/2023 CLINICAL DATA:  Shortness of breath. EXAM: CHEST - 2 VIEW COMPARISON:  08/02/2023 FINDINGS: Normal sized heart. Tortuous and partially calcified thoracic aorta. Clear lungs with normal vascularity. Mild peribronchial thickening. Mild thoracic spine degenerative changes. IMPRESSION: Mild bronchitic changes. Electronically Signed   By: Beckie Salts M.D.   On: 08/06/2023 15:55   CT ABDOMEN PELVIS WO CONTRAST  Result Date: 08/02/2023 CLINICAL DATA:  Evaluate patency of the ureteral stent EXAM: CT ABDOMEN AND PELVIS WITHOUT CONTRAST TECHNIQUE: Multidetector CT imaging of the abdomen and pelvis was performed following the standard protocol without IV contrast. RADIATION DOSE REDUCTION: This exam was performed according to the departmental dose-optimization program which includes automated exposure control, adjustment of the mA and/or kV according to patient size and/or use of iterative reconstruction technique. COMPARISON:  04/21/2022 FINDINGS: Lower chest: No pleural or pericardial effusion. Coronary and aortic calcified plaque. Hepatobiliary: No focal liver abnormality is seen. No gallstones, gallbladder wall thickening, or biliary dilatation. Pancreas: Unremarkable. No pancreatic ductal dilatation or surrounding inflammatory changes. Spleen: Normal in size without focal abnormality. Adrenals/Urinary Tract: No adrenal mass. Right kidney absent. Normal left renal contour. Double-J left ureteral stent in expected location with only mild distension of the lower pole left renal collecting system, stable since previous. Mild inflammatory/edematous changes in the perinephric fat. Urinary bladder incompletely distended. Stomach/Bowel: Small hiatal hernia. Stomach nondistended. Small bowel decompressed. Appendix not  identified. Colon is partially distended, with a few scattered descending diverticula; no adjacent inflammatory change. Vascular/Lymphatic: Scattered heavy aortoiliac atheromatous calcifications without aneurysm. Left para-aortic retroperitoneal adenopathy, largest 1.5 cm (Im44,Se2) , previously 0.7 cm. A few subcentimeter aortocaval nodes. No pelvic or mesenteric adenopathy. Reproductive: Status post hysterectomy. No adnexal masses. Other: Bilateral pelvic phleboliths.  No ascites.  No free air. Musculoskeletal: Multilevel thoracolumbar spondylitic change. IMPRESSION: 1. Left ureteral stent in expected location with persistent perinephric stranding, no hydronephrosis. 2. Left para-aortic retroperitoneal adenopathy, increased since previous. 3.  Aortic Atherosclerosis (ICD10-I70.0). Electronically Signed   By: Corlis Leak M.D.   On: 08/02/2023 20:03   DG Chest 2 View  Result Date: 08/02/2023 CLINICAL DATA:  fatigue, leukocytosis, hematuria EXAM: CHEST - 2 VIEW COMPARISON:  06/05/2022 FINDINGS: Lungs are clear. Heart size and mediastinal contours are within normal limits. Aortic Atherosclerosis (ICD10-170.0). No effusion. Visualized bones unremarkable. IMPRESSION: No acute cardiopulmonary disease. Electronically Signed   By: Corlis Leak M.D.   On: 08/02/2023 19:54   (Echo, Carotid, EGD, Colonoscopy, ERCP)    Subjective:  No complaints anxious to go home no nausea vomiting Discharge Exam: Vitals:   08/09/23 2013 08/10/23 0540  BP: 125/62 (!) 154/62  Pulse: 61 (!) 59  Resp: 17 17  Temp: 98 F (36.7 C) 98.5 F (36.9 C)  SpO2: 94% 90%  Vitals:   08/09/23 1244 08/09/23 2013 08/10/23 0540 08/10/23 0541  BP: 116/78 125/62 (!) 154/62   Pulse: 98 61 (!) 59   Resp: 20 17 17    Temp: 97.8 F (36.6 C) 98 F (36.7 C) 98.5 F (36.9 C)   TempSrc: Oral Oral Oral   SpO2:  94% 90%   Weight:    75.3 kg  Height:        General: Pt is alert, awake, not in acute distress Cardiovascular: RRR, S1/S2 +,  no rubs, no gallops Respiratory: CTA bilaterally, no wheezing, no rhonchi Abdominal: Soft, NT, ND, bowel sounds + Extremities: no edema, no cyanosis    The results of significant diagnostics from this hospitalization (including imaging, microbiology, ancillary and laboratory) are listed below for reference.     Microbiology: Recent Results (from the past 240 hour(s))  Urine Culture     Status: Abnormal   Collection Time: 08/02/23  6:04 PM   Specimen: Urine, Random  Result Value Ref Range Status   Specimen Description   Final    URINE, RANDOM Performed at [facility], [address], [place], [place] [address]    Special Requests   Final    NONE Reflexed from [id number] Performed at [facility], [address], [place], [place] [address]    Culture 70,000 COLONIES/mL YEAST (A)  Final   Report Status 08/03/2023 FINAL  Final     Labs: BNP (last 3 results) Recent Labs    08/04/23 0420  BNP 276.3*   Basic Metabolic Panel: Recent Labs  Lab 08/06/23 0415 08/07/23 0402 08/08/23 0351 08/09/23 0840 08/10/23 0728  NA 138 141 138 137 141  K 5.0 4.3 3.7 3.9 4.2  CL 114* 112* 103 100 101  CO2 19* 19* 23 27 27   GLUCOSE 90 96 90 124* 104*  BUN 52* 47* 45* 45* 46*  CREATININE 4.98* 4.64* 4.20* 3.85* 3.53*  CALCIUM 7.5* 8.5* 8.4* 8.5* 8.6*   Liver Function Tests: No results for input(s): "AST", "ALT", "ALKPHOS", "BILITOT", "PROT", "ALBUMIN" in the last 168 hours. No results for input(s): "LIPASE", "AMYLASE" in the last 168 hours. No results for input(s): "AMMONIA" in the last 168 hours. CBC: Recent Labs  Lab 08/04/23 1543 08/05/23 0433 08/05/23 1618 08/06/23 0415 08/07/23 0402 08/08/23 0351  WBC 7.4 8.9  --  8.4 8.5 7.6  NEUTROABS 4.7  --   --   --   --   --   HGB 7.4* 7.4* 8.3* 8.3* 8.8* 10.2*  HCT 23.9* 24.6* 27.0* 26.8* 28.9* 32.5*  MCV 93.7 95.7  --  95.4 93.2 91.5  PLT 196 204  --  197 206 240   Cardiac Enzymes: No  results for input(s): "CKTOTAL", "CKMB", "CKMBINDEX", "TROPONINI" in the last 168 hours. BNP: Invalid input(s): "POCBNP" CBG: No results for input(s): "GLUCAP" in the last 168 hours. D-Dimer No results for input(s): "DDIMER" in the last 72 hours. Hgb A1c No results for input(s): "HGBA1C" in the last 72 hours. Lipid Profile No results for input(s): "CHOL", "HDL", "LDLCALC", "TRIG", "CHOLHDL", "LDLDIRECT" in the last 72 hours. Thyroid function studies No results for input(s): "TSH", "T4TOTAL", "T3FREE", "THYROIDAB" in the last 72 hours.  Invalid input(s): "FREET3" Anemia work up No results for input(s): "VITAMINB12", "FOLATE", "FERRITIN", "TIBC", "IRON", "RETICCTPCT" in the last 72 hours. Urinalysis    Component Value Date/Time   COLORURINE YELLOW 08/02/2023 1804   APPEARANCEUR CLOUDY (A) 08/02/2023 1804   LABSPEC 1.006 08/02/2023 1804   PHURINE  5.0 08/02/2023 1804   GLUCOSEU NEGATIVE 08/02/2023 1804   HGBUR LARGE (A) 08/02/2023 1804   BILIRUBINUR NEGATIVE 08/02/2023 1804   KETONESUR NEGATIVE 08/02/2023 1804   PROTEINUR 30 (A) 08/02/2023 1804   NITRITE NEGATIVE 08/02/2023 1804   LEUKOCYTESUR LARGE (A) 08/02/2023 1804   Sepsis Labs Recent Labs  Lab 08/05/23 0433 08/06/23 0415 08/07/23 0402 08/08/23 0351  WBC 8.9 8.4 8.5 7.6   Microbiology Recent Results (from the past 240 hour(s))  Urine Culture     Status: Abnormal   Collection Time: 08/02/23  6:04 PM   Specimen: Urine, Random  Result Value Ref Range Status   Specimen Description   Final    URINE, RANDOM Performed at [facility], [address], [place], [place] [address]    Special Requests   Final    NONE Reflexed from [id number] Performed at [facility], [address] [place], [place] [address]    Culture 70,000 COLONIES/mL YEAST (A)  Final   Report Status 08/03/2023 FINAL  Final     Time coordinating discharge: Over 30 minutes  SIGNED:   Alwyn Ren,  MD  [facility] 08/10/2023, 2:17 PM Pager   If 7PM-7AM, please contact night-coverage [email or web address] Password [email or web address]

## 2023-08-10 NOTE — TOC Transition Note (Signed)
Transition of Care [facility]) - CM/SW Discharge Note   Patient Details  Name: ARLETHIA BASSO MRN: [id number] Date of Birth: 1945/10/15  Transition of [facility]) CM/SW Contact:  Adrian Prows, RN Phone Number: 08/10/2023, 12:13 PM   Clinical Narrative:    D/C orders received; no TOC needs.   Final next level of care: Home/Self Care Barriers to Discharge: No Barriers Identified   Patient Goals and CMS Choice      Discharge Placement                         Discharge Plan and Services Additional resources added to the After Visit Summary for                                       Social Determinants of Health (SDOH) Interventions SDOH Screenings   Food Insecurity: No Food Insecurity (08/02/2023)  Housing: Low Risk  (08/02/2023)  Transportation Needs: No Transportation Needs (08/02/2023)  Utilities: Not At Risk (08/02/2023)  Tobacco Use: Medium Risk (08/02/2023)     Readmission Risk Interventions    08/03/2023   10:47 AM 06/01/2022   11:43 AM 04/25/2022   12:11 PM  Readmission Risk Prevention Plan  Transportation Screening Complete Complete Complete  PCP or Specialist Appt within 3-5 Days Complete    HRI or Home Care Consult Complete  Complete  Social Work Consult for Recovery Care Planning/Counseling Complete  Complete  Palliative Care Screening Not Applicable  Not Applicable  Medication Review Oceanographer) Complete Complete Complete  PCP or Specialist appointment within 3-5 days of discharge  Complete   HRI or Home Care Consult  Complete   SW Recovery Care/Counseling Consult  Complete   Palliative Care Screening  Not Applicable   Skilled Nursing Facility  Not Applicable

## 2023-08-10 NOTE — Plan of Care (Signed)
  Problem: Education: Goal: Knowledge of General Education information will improve Description Including pain rating scale, medication(s)/side effects and non-pharmacologic comfort measures Outcome: Progressing   

## 2023-08-16 DIAGNOSIS — N184 Chronic kidney disease, stage 4 (severe): Secondary | ICD-10-CM | POA: Diagnosis not present

## 2023-08-16 DIAGNOSIS — Z905 Acquired absence of kidney: Secondary | ICD-10-CM | POA: Diagnosis not present

## 2023-08-16 DIAGNOSIS — F5104 Psychophysiologic insomnia: Secondary | ICD-10-CM | POA: Diagnosis not present

## 2023-08-16 DIAGNOSIS — C689 Malignant neoplasm of urinary organ, unspecified: Secondary | ICD-10-CM | POA: Diagnosis not present

## 2023-08-22 DIAGNOSIS — D631 Anemia in chronic kidney disease: Secondary | ICD-10-CM | POA: Diagnosis not present

## 2023-08-22 DIAGNOSIS — I129 Hypertensive chronic kidney disease with stage 1 through stage 4 chronic kidney disease, or unspecified chronic kidney disease: Secondary | ICD-10-CM | POA: Diagnosis not present

## 2023-08-22 DIAGNOSIS — N2581 Secondary hyperparathyroidism of renal origin: Secondary | ICD-10-CM | POA: Diagnosis not present

## 2023-08-22 DIAGNOSIS — N179 Acute kidney failure, unspecified: Secondary | ICD-10-CM | POA: Diagnosis not present

## 2023-08-22 DIAGNOSIS — N189 Chronic kidney disease, unspecified: Secondary | ICD-10-CM | POA: Diagnosis not present

## 2023-08-22 DIAGNOSIS — N184 Chronic kidney disease, stage 4 (severe): Secondary | ICD-10-CM | POA: Diagnosis not present

## 2023-08-22 DIAGNOSIS — C689 Malignant neoplasm of urinary organ, unspecified: Secondary | ICD-10-CM | POA: Diagnosis not present

## 2023-09-05 DIAGNOSIS — K5900 Constipation, unspecified: Secondary | ICD-10-CM | POA: Diagnosis not present

## 2023-09-05 DIAGNOSIS — Z1211 Encounter for screening for malignant neoplasm of colon: Secondary | ICD-10-CM | POA: Diagnosis not present

## 2023-09-05 DIAGNOSIS — R197 Diarrhea, unspecified: Secondary | ICD-10-CM | POA: Diagnosis not present

## 2023-09-17 DIAGNOSIS — Z1211 Encounter for screening for malignant neoplasm of colon: Secondary | ICD-10-CM | POA: Diagnosis not present

## 2023-09-28 LAB — COLOGUARD: COLOGUARD: NEGATIVE

## 2023-10-09 DIAGNOSIS — N184 Chronic kidney disease, stage 4 (severe): Secondary | ICD-10-CM | POA: Diagnosis not present

## 2023-10-26 DIAGNOSIS — D631 Anemia in chronic kidney disease: Secondary | ICD-10-CM | POA: Diagnosis not present

## 2023-10-26 DIAGNOSIS — C689 Malignant neoplasm of urinary organ, unspecified: Secondary | ICD-10-CM | POA: Diagnosis not present

## 2023-10-26 DIAGNOSIS — N2581 Secondary hyperparathyroidism of renal origin: Secondary | ICD-10-CM | POA: Diagnosis not present

## 2023-10-26 DIAGNOSIS — I10 Essential (primary) hypertension: Secondary | ICD-10-CM | POA: Diagnosis not present

## 2023-10-26 DIAGNOSIS — N179 Acute kidney failure, unspecified: Secondary | ICD-10-CM | POA: Diagnosis not present

## 2023-10-26 DIAGNOSIS — N184 Chronic kidney disease, stage 4 (severe): Secondary | ICD-10-CM | POA: Diagnosis not present

## 2023-10-26 DIAGNOSIS — N189 Chronic kidney disease, unspecified: Secondary | ICD-10-CM | POA: Diagnosis not present

## 2023-10-26 DIAGNOSIS — I129 Hypertensive chronic kidney disease with stage 1 through stage 4 chronic kidney disease, or unspecified chronic kidney disease: Secondary | ICD-10-CM | POA: Diagnosis not present

## 2023-11-19 ENCOUNTER — Ambulatory Visit
Admission: RE | Admit: 2023-11-19 | Discharge: 2023-11-19 | Disposition: A | Discharge status: Home / Self Care | Payer: Medicare PPO | Source: Ambulatory Visit | Attending: Nurse Practitioner | Admitting: Nurse Practitioner

## 2023-11-19 ENCOUNTER — Other Ambulatory Visit: Payer: Self-pay | Admitting: Nurse Practitioner

## 2023-11-19 DIAGNOSIS — R197 Diarrhea, unspecified: Secondary | ICD-10-CM

## 2023-11-19 DIAGNOSIS — K5900 Constipation, unspecified: Secondary | ICD-10-CM

## 2023-12-14 ENCOUNTER — Other Ambulatory Visit: Payer: Self-pay

## 2023-12-14 DIAGNOSIS — C689 Malignant neoplasm of urinary organ, unspecified: Secondary | ICD-10-CM

## 2023-12-14 DIAGNOSIS — D649 Anemia, unspecified: Secondary | ICD-10-CM

## 2023-12-17 ENCOUNTER — Inpatient Hospital Stay: Payer: Medicare Other | Admitting: Hematology

## 2023-12-17 ENCOUNTER — Inpatient Hospital Stay: Payer: Medicare Other | Attending: Hematology

## 2023-12-17 VITALS — BP 115/66 | HR 62 | Temp 97.6°F | Resp 16 | Ht 63.0 in | Wt 172.2 lb

## 2023-12-17 DIAGNOSIS — C689 Malignant neoplasm of urinary organ, unspecified: Secondary | ICD-10-CM | POA: Diagnosis not present

## 2023-12-17 DIAGNOSIS — D649 Anemia, unspecified: Secondary | ICD-10-CM | POA: Diagnosis not present

## 2023-12-17 DIAGNOSIS — Z87891 Personal history of nicotine dependence: Secondary | ICD-10-CM | POA: Insufficient documentation

## 2023-12-17 DIAGNOSIS — C679 Malignant neoplasm of bladder, unspecified: Secondary | ICD-10-CM | POA: Diagnosis not present

## 2023-12-17 DIAGNOSIS — Z79899 Other long term (current) drug therapy: Secondary | ICD-10-CM | POA: Diagnosis not present

## 2023-12-17 DIAGNOSIS — N189 Chronic kidney disease, unspecified: Secondary | ICD-10-CM | POA: Diagnosis not present

## 2023-12-17 LAB — CMP (CANCER CENTER ONLY)
ALT: 9 U/L (ref 0–44)
AST: 11 U/L — ABNORMAL LOW (ref 15–41)
Albumin: 4 g/dL (ref 3.5–5.0)
Alkaline Phosphatase: 61 U/L (ref 38–126)
Anion gap: 4 — ABNORMAL LOW (ref 5–15)
BUN: 24 mg/dL — ABNORMAL HIGH (ref 8–23)
CO2: 29 mmol/L (ref 22–32)
Calcium: 9 mg/dL (ref 8.9–10.3)
Chloride: 104 mmol/L (ref 98–111)
Creatinine: 3.03 mg/dL — ABNORMAL HIGH (ref 0.44–1.00)
GFR, Estimated: 16 mL/min — ABNORMAL LOW (ref >60)
Glucose, Bld: 127 mg/dL — ABNORMAL HIGH (ref 70–99)
Potassium: 4.5 mmol/L (ref 3.5–5.1)
Sodium: 137 mmol/L (ref 135–145)
Total Bilirubin: 0.3 mg/dL (ref 0.0–1.2)
Total Protein: 7.5 g/dL (ref 6.5–8.1)

## 2023-12-17 LAB — CBC WITH DIFFERENTIAL (CANCER CENTER ONLY)
Abs Immature Granulocytes: 0.01 10*3/uL (ref 0.00–0.07)
Basophils Absolute: 0 10*3/uL (ref 0.0–0.1)
Basophils Relative: 1 %
Eosinophils Absolute: 0.3 10*3/uL (ref 0.0–0.5)
Eosinophils Relative: 4 %
HCT: 39.2 % (ref 36.0–46.0)
Hemoglobin: 12.6 g/dL (ref 12.0–15.0)
Immature Granulocytes: 0 %
Lymphocytes Relative: 30 %
Lymphs Abs: 2 10*3/uL (ref 0.7–4.0)
MCH: 30.4 pg (ref 26.0–34.0)
MCHC: 32.1 g/dL (ref 30.0–36.0)
MCV: 94.5 fL (ref 80.0–100.0)
Monocytes Absolute: 0.4 10*3/uL (ref 0.1–1.0)
Monocytes Relative: 6 %
Neutro Abs: 4.1 10*3/uL (ref 1.7–7.7)
Neutrophils Relative %: 59 %
Platelet Count: 146 10*3/uL — ABNORMAL LOW (ref 150–400)
RBC: 4.15 MIL/uL (ref 3.87–5.11)
RDW: 13.6 % (ref 11.5–15.5)
WBC Count: 6.8 10*3/uL (ref 4.0–10.5)
nRBC: 0 % (ref 0.0–0.2)

## 2023-12-17 LAB — VITAMIN B12: Vitamin B-12: 464 pg/mL (ref 180–914)

## 2023-12-17 LAB — FERRITIN: Ferritin: 140 ng/mL (ref 11–307)

## 2023-12-17 NOTE — Progress Notes (Signed)
 HEMATOLOGY/ONCOLOGY CLINIC NOTE  Date of Service: 12/17/2023  Patient Care Team: Shon Hale, MD as PCP - General (Family Medicine) Christell Constant, MD as PCP - Cardiology (Cardiology) Johney Maine, MD as Consulting Physician (Hematology)  CHIEF COMPLAINTS/PURPOSE OF CONSULTATION:  Urothelial carcinoma    Secondary diagnoses: high-grade urothelial carcinoma of the right distal ureter in 2008.   Prior Therapy:   She is status post right nephroureterectomy for a distal ureteral carcinoma in 2008   She is status post cystoscopy and a stent placement in September 2020 and repeated in February 2021 for a left ureteral tumor.  The cytology showed high-grade urothelial carcinoma.   She is status post BCG treatment in 2021.  Subsequent cystoscopy in 2022 showed atypia without obvious urothelial carcinoma.  In July 2022 cytology showed high-grade urothelial carcinoma.   She is status post Jelmyto instillation under the care of Dr. Retta Diones in September 2022 was completed in November 2022.  Following cystoscopy in January 2023 did show some residual tumor by cytology.   In May 2023 she completed repeat Jelmyto infusion.      Current therapy: Intermittent cystoscopy and tumor ablation.  Under consideration for systemic therapy.  HISTORY OF PRESENTING ILLNESS:  [name] is a wonderful 78 y.o. female who is here for continued evaluation and management of Urothelial carcinoma. Patient has been transferred to [place] by Dr. Clelia Croft. Patient was diagnosed with Ureteral cancer in September 2020 and was found to have high-grade urothelial carcinoma without any clear-cut muscle invasion. Her current treatment includes intermittent cystoscopy and tumor ablation.  Patient was last seen by Dr. Clelia Croft on 09/13/2022 and she complained of occasional dizziness without syncope or falls.   Patient is accompanied by her husband during this visit. She reports she has been doing  fairly well without any new or severe medical concerns since her last visit with Dr. Clelia Croft.   She denies fever, chills, night sweats, unexpected weight loss, abdominal pain, chest pain, new lump/bump, back pain, hematuria, abnormal bowel moment, shortness of breath, or leg swelling.   Patient notes she has been eating well overall and her weight has been stable.   Patient denies any PET or CT scans. She denies of any urinary infection.  She continues to follow-up with Dr. Retta Diones, but notes that Dr. Retta Diones is retiring soon.  INTERVAL HISTORY:  [name] is a wonderful 78 y.o. female who is here for continued evaluation and management of Urothelial carcinoma.   Patient was last seen by me on 06/18/2023 and he complained of occasional intermittent urinary bladder pressure and slightly worsened diarrhea.   Patient notes she has been doing well overall since our last visit. She was hospitalized in October, where she had CT scan showing left para-aortic retroperitoneal adenopathy. She has been following up with Dr. Kathrynn Running and has repeat CT scan scheduled this Wednesday, 12/19/2023.   She denies any new infection issues, fever, chills, night sweats, unexpected weight loss, back pain, hematuria, chest pain, abdominal pain, or leg swelling. She does complain of diarrhea, but notes that it has improved since our last visit. She does report occasional mild abdominal pain/discomfort.   She regularly takes her oral iron supplement.      Patient is complaint with her medication.   MEDICAL HISTORY:  Past Medical History:  Diagnosis Date   Anemia associated with chronic renal failure    CAD in native artery 10/2019   cardiologist-   dr Judie Petit. Izora Ribas;  a. 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   urologist--- dr dahlstedt/  oncologist--- dr Clelia Croft;   dx 09/ 2020 high grade  urothelial  s/p laser ablation, ;  recurrent s/p BCG instillation,  completed chemo instilation 11/ 2022  and repeat chemo completed 05/ 2023   Chronic combined systolic and diastolic CHF (congestive heart failure) (HCC) 06/2019   followed by cardiology;   dx 09/ 2022 in setting of sepsis, pulm edema;   05/ 2022  ef 40-45% per echo,  recovered per cath 01/ 2021 ef 50%;  laste echo 08/ 2023  ef 60-65%   CKD (chronic kidney disease), stage III (HCC)    Depression    GAD (generalized anxiety disorder)    GERD (gastroesophageal reflux disease)    History of bladder cancer 12/2006   followed by dr Retta Diones   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   dx 04/ 2008  w/ poor function hydronephrotic  right kidney;   02-06-2007  s/p right nephroureterectomy   History of pulmonary embolism 04/2022   in setting severe sepsis;  small RLL, treated w/ 3 months eliquis   Hyperlipidemia, mixed    Hypertension    Solitary kidney, acquired 02/06/2007   s/p  right nephroureterectomy for cancer    SURGICAL HISTORY: Past Surgical History:  Procedure Laterality Date   CATARACT EXTRACTION W/ INTRAOCULAR LENS IMPLANT Bilateral 2011   CYSTOSCOPY W/ URETERAL STENT PLACEMENT Left 11/17/2019   Procedure: CYSTOSCOPY WITH STENT REPLACEMENT;  Surgeon: Marcine Matar, MD;  Location: [facility];  Service: Urology;  Laterality: Left;   CYSTOSCOPY W/ URETERAL STENT PLACEMENT Left 03/23/2022   Procedure: CYSTOSCOPY WITH RETROGRADE PYELOGRAM, LEFT URETERAL STENT PLACEMENT;  Surgeon: Marcine Matar, MD;  Location: [facility];  Service: Urology;  Laterality: Left;   CYSTOSCOPY W/ URETERAL STENT PLACEMENT Left 06/09/2022   Procedure: CYSTOSCOPY WITH LEFT STENT EXCHANGE;  Surgeon: Marcine Matar, MD;  Location: [facility];  Service: Urology;  Laterality: Left;   CYSTOSCOPY W/ URETERAL STENT PLACEMENT Left 12/04/2022   Procedure: CYSTOSCOPY WITH STENT REPLACEMENT;  Surgeon: Marcine Matar,  MD;  Location: [facility];  Service: Urology;  Laterality: Left;   CYSTOSCOPY W/ URETERAL STENT REMOVAL Left 05/10/2020   Procedure: CYSTOSCOPY WITH STENT REMOVAL;  Surgeon: Marcine Matar, MD;  Location: [facility];  Service: Urology;  Laterality: Left;   CYSTOSCOPY W/ URETERAL STENT REMOVAL Left 05/02/2021   Procedure: CYSTOSCOPY WITH STENT REMOVAL;  Surgeon: Marcine Matar, MD;  Location: [facility];  Service: Urology;  Laterality: Left;   CYSTOSCOPY W/ URETERAL STENT REMOVAL Left 11/16/2022   Procedure: CYSTOSCOPY WITH STENT REMOVAL;  Surgeon: Marcine Matar, MD;  Location: [facility];  Service: Urology;  Laterality: Left;   CYSTOSCOPY WITH RETROGRADE PYELOGRAM, URETEROSCOPY AND STENT PLACEMENT Left 06/27/2019   Procedure: CYSTOSCOPY WITH LEFT RETROGRADE PYELOGRAM, URETEROSCOPY, BIOPSY AND LEFT STENT PLACEMENT;  Surgeon: Bjorn Pippin, MD;  Location: [facility];  Service: Urology;  Laterality: Left;   CYSTOSCOPY WITH RETROGRADE PYELOGRAM, URETEROSCOPY AND STENT PLACEMENT Left 08/18/2019   Procedure: CYSTOSCOPY, URETEROSCOPY AND STENT EXCHANGE;  Surgeon: Marcine Matar, MD;  Location: [facility];  Service: Urology;  Laterality: Left;  90 MINS   CYSTOSCOPY WITH RETROGRADE PYELOGRAM, URETEROSCOPY AND STENT PLACEMENT Left 11/17/2019   Procedure: CYSTOSCOPY WITH RETROGRADE PYELOGRAM, URETEROSCOPY AND STENT PLACEMENT;  Surgeon: Marcine Matar, MD;  Location: [facility];  Service: Urology;  Laterality: Left;  90 MINS   CYSTOSCOPY WITH RETROGRADE PYELOGRAM, URETEROSCOPY AND STENT PLACEMENT Left 05/10/2020   Procedure: CYSTOSCOPY WITH RETROGRADE PYELOGRAM, URETEROSCOPY AND STENT PLACEMENT WITH URETHRAL DIALATION AND BRUSH BIOPSY;  Surgeon: Marcine Matar, MD;  Location: [facility];  Service: Urology;  Laterality: Left;  1 HR   CYSTOSCOPY WITH RETROGRADE PYELOGRAM, URETEROSCOPY AND STENT PLACEMENT  Left 09/16/2020   Procedure: CYSTOSCOPY WITH RETROGRADE PYELOGRAM, URETEROSCOPY ,  LITHOPAXY, LEFT URETERAL BRUSHING, AND STENT REPLACEMENT;  Surgeon: Marcine Matar, MD;  Location: [facility];  Service: Urology;  Laterality: Left;   CYSTOSCOPY WITH RETROGRADE PYELOGRAM, URETEROSCOPY AND STENT PLACEMENT Left 10/24/2021   Procedure: CYSTOSCOPY WITH RETROGRADE PYELOGRAM, URETEROSCOPY, POSSIBLE URETERAL AND RENAL BIOPSIES AND STENT PLACEMENT;  Surgeon: Marcine Matar, MD;  Location: [facility];  Service: Urology;  Laterality: Left;  1 HR   CYSTOSCOPY WITH RETROGRADE PYELOGRAM, URETEROSCOPY AND STENT PLACEMENT Left 03/09/2022   Procedure: CYSTOSCOPY WITH ANTEGRADE PYELOGRAM,  STENT REMOVAL;  Surgeon: Marcine Matar, MD;  Location: [facility];  Service: Urology;  Laterality: Left;   CYSTOSCOPY WITH RETROGRADE PYELOGRAM, URETEROSCOPY AND STENT PLACEMENT  01/09/2007   @[facility]  by dr Retta Diones;    bx's/ washing  bladder , right ureter   CYSTOSCOPY WITH RETROGRADE PYELOGRAM, URETEROSCOPY AND STENT PLACEMENT Left 11/16/2022   Procedure: CYSTOSCOPY WITH RETROGRADE PYELOGRAM, URETEROSCOPY AND STENT REPLACEMENT;  Surgeon: Marcine Matar, MD;  Location: [facility];  Service: Urology;  Laterality: Left;  90 MINS   CYSTOSCOPY WITH RETROGRADE PYELOGRAM, URETEROSCOPY AND STENT PLACEMENT Left 07/06/2023   Procedure: CYSTOSCOPY WITH LEFT RETROGRADE PYELOGRAM, URETEROSCOPY, LASER ABLATION OF TUMOR AND STENT EXCHANGE;  Surgeon: Loletta Parish., MD;  Location: [facility];  Service: Urology;  Laterality: Left;  60 MINUTES NEEDED FOR CASE   CYSTOSCOPY/URETEROSCOPY/HOLMIUM LASER Left 12/04/2022   Procedure: CYSTOSCOPY/URETEROSCOPY/  HOLMIUM LASER OF RENAL PELVIC LESIONS;  Surgeon: Marcine Matar, MD;  Location: [facility];  Service: Urology;  Laterality: Left;   CYSTOSCOPY/URETEROSCOPY/HOLMIUM LASER/STENT PLACEMENT Left 05/02/2021    Procedure: CYSTOSCOPY/URETEROSCOPY WITH BRUSH BIOPSY/ RETROGRADE PYELOGRAM/ HOLMIUM LASER/STENT REPLACEMENT;  Surgeon: Marcine Matar, MD;  Location: [facility];  Service: Urology;  Laterality: Left;   HOLMIUM LASER APPLICATION Left 09/16/2020   Procedure: HOLMIUM LASER APPLICATION;  Surgeon: Marcine Matar, MD;  Location: [facility];  Service: Urology;  Laterality: Left;   HOLMIUM LASER APPLICATION Left 10/24/2021   Procedure: HOLMIUM LASER APPLICATION OF TUMORS;  Surgeon: Marcine Matar, MD;  Location: [facility];  Service: Urology;  Laterality: Left;   HOLMIUM LASER APPLICATION Left 11/16/2022   Procedure: HOLMIUM LASER OF TUMORS;  Surgeon: Marcine Matar, MD;  Location: [facility];  Service: Urology;  Laterality: Left;   IR NEPHROSTOMY PLACEMENT LEFT  07/06/2021   IR NEPHROSTOMY PLACEMENT LEFT  02/06/2022   NEPHROSTOMY TUBE REMOVAL Left 03/23/2022   Procedure: NEPHROSTOMY TUBE REMOVAL;  Surgeon: Marcine Matar, MD;  Location: [facility];  Service: Urology;  Laterality: Left;   NEPHROURETERECTOMY Right 02/06/2007   @[facility]  by dr Retta Diones;   Laparoscopic   RIGHT/LEFT HEART CATH AND CORONARY ANGIOGRAPHY N/A 11/06/2019   Procedure: RIGHT/LEFT HEART CATH AND CORONARY ANGIOGRAPHY;  Surgeon: Lyn Records, MD;  [facility];  Service: Cardiovascular;  Laterality: N/A;   THULIUM LASER TURP (TRANSURETHRAL RESECTION OF PROSTATE) Left 08/18/2019   Procedure: THULIUM LASER ABLATION OF URETERAL TUMOR;  Surgeon:  Marcine Matar, MD;  Location: [facility];  Service: Urology;  Laterality: Left;   THULIUM LASER TURP (TRANSURETHRAL RESECTION OF PROSTATE) Left 11/17/2019   Procedure: THULIUM LASER of URETERAL CANCER;  Surgeon: Marcine Matar, MD;  Location: [facility];  Service: Urology;  Laterality: Left;   TRANSURETHRAL RESECTION OF BLADDER TUMOR  12/12/2006   @[facility]  by dr Vonita Moss    VAGINAL HYSTERECTOMY  1980    SOCIAL HISTORY: Social History   Socioeconomic History   Marital status: Married    Spouse name: Not on file   Number of children: Not on file   Years of education: Not on file   Highest education level: Not on file  Occupational History   Not on file  Tobacco Use   Smoking status: Former    Current packs/day: 0.00    Average packs/day: 0.5 packs/day for 38.0 years (19.0 ttl pk-yrs)    Types: Cigarettes    Start date: 11/25/1965    Quit date: 11/26/2003    Years since quitting: 20.0   Smokeless tobacco: Never  Vaping Use   Vaping status: Never Used  Substance and Sexual Activity   Alcohol use: Not Currently   Drug use: Never   Sexual activity: Yes    Birth control/protection: Surgical, Post-menopausal  Other Topics Concern   Not on file  Social History Narrative   Not on file   Social Drivers of Health   Financial Resource Strain: Not on file  Food Insecurity: No Food Insecurity (08/02/2023)   Hunger Vital Sign    Worried About Running Out of Food in the Last Year: Never true    Ran Out of Food in the Last Year: Never true  Transportation Needs: No Transportation Needs (08/02/2023)   PRAPARE - Administrator, Civil Service (Medical): No    Lack of Transportation (Non-Medical): No  Physical Activity: Not on file  Stress: Not on file  Social Connections: Not on file  Intimate Partner Violence: Not At Risk (08/02/2023)   Humiliation, Afraid, Rape, and Kick questionnaire    Fear of Current or Ex-Partner: No    Emotionally Abused: No    Physically Abused: No    Sexually Abused: No    FAMILY HISTORY: Family History  Problem Relation Age of Onset   Hypertension Mother    Diabetes Mellitus II Sister    Hypertension Sister    Hypertension Brother     ALLERGIES:  is allergic to erythromycin, lotensin [benazepril hcl], and macrodantin [nitrofurantoin].  MEDICATIONS:  Current Outpatient Medications  Medication Sig  Dispense Refill   acetaminophen (TYLENOL) 500 MG tablet Take 500-1,000 mg by mouth every 6 (six) hours as needed for mild pain.     aspirin EC 81 MG tablet Take 1 tablet (81 mg total) by mouth daily. Swallow whole. 90 tablet 3   calcium carbonate (TUMS EX) 750 MG chewable tablet Chew 1 tablet by mouth every morning.     carvedilol (COREG) 6.25 MG tablet TAKE 1 TABLET(6.25 MG) BY MOUTH TWICE DAILY WITH A MEAL (Patient taking differently: Take 3.125 mg by mouth in the morning and at bedtime.) 180 tablet 3   cholecalciferol (VITAMIN D3) 25 MCG (1000 UNIT) tablet Take 1,000 Units by mouth every morning.     Ferrous Sulfate (IRON PO) Take 1 tablet by mouth in the morning.     icosapent Ethyl (VASCEPA) 1 g capsule Take 1 capsule (1 g total) by mouth 2 (two) times daily. 60 capsule 11  isosorbide mononitrate (IMDUR) 30 MG 24 hr tablet TAKE 1/2 TABLET(15 MG) BY MOUTH DAILY 45 tablet 3   oxyCODONE (ROXICODONE) 5 MG immediate release tablet Take 1 tablet (5 mg total) by mouth every 6 (six) hours as needed for moderate pain or severe pain (post-operatively). 15 tablet 0   promethazine (PHENERGAN) 12.5 MG tablet Take 12.5 mg by mouth every 12 (twelve) hours as needed.     rosuvastatin (CRESTOR) 40 MG tablet TAKE 1 TABLET(40 MG) BY MOUTH DAILY 90 tablet 3   senna-docusate (SENOKOT-S) 8.6-50 MG tablet Take 1 tablet by mouth 2 (two) times daily. While taking strong pain meds to prevent constipation. (Patient not taking: Reported on 08/02/2023) 10 tablet 0   sertraline (ZOLOFT) 50 MG tablet Take 50 mg by mouth every morning.     traZODone (DESYREL) 150 MG tablet Take 150 mg by mouth at bedtime.     No current facility-administered medications for this visit.    REVIEW OF SYSTEMS:    10 Point review of Systems was done is negative except as noted above.  PHYSICAL EXAMINATION: ECOG PERFORMANCE STATUS: 2 - Symptomatic, <50% confined to bed  . Vitals:   12/17/23 1338  BP: 115/66  Pulse: 62  Resp: 16   Temp: 97.6 F (36.4 C)  SpO2: 97%    Filed Weights   12/17/23 1338  Weight: 172 lb 3.2 oz (78.1 kg)    .Body mass index is 30.5 kg/m.  GENERAL:alert, in no acute distress and comfortable SKIN: no acute rashes, no significant lesions EYES: conjunctiva are pink and non-injected, sclera anicteric OROPHARYNX: MMM, no exudates, no oropharyngeal erythema or ulceration NECK: supple, no JVD LYMPH:  no palpable lymphadenopathy in the cervical, axillary or inguinal regions LUNGS: clear to auscultation b/l with normal respiratory effort HEART: regular rate & rhythm ABDOMEN:  normoactive bowel sounds , non tender, not distended. Extremity: no pedal edema PSYCH: alert & oriented x 3 with fluent speech NEURO: no focal motor/sensory deficits  LABORATORY DATA:  I have reviewed the data as listed .    Latest Ref Rng & Units 12/17/2023    1:05 PM 08/08/2023    3:51 AM 08/07/2023    4:02 AM  CBC  WBC 4.0 - 10.5 K/uL 6.8  7.6  8.5   Hemoglobin 12.0 - 15.0 g/dL 40.9  81.1  8.8   Hematocrit 36.0 - 46.0 % 39.2  32.5  28.9   Platelets 150 - 400 K/uL 146  240  206    .    Latest Ref Rng & Units 12/17/2023    1:05 PM 08/10/2023    7:28 AM 08/09/2023    8:40 AM  CMP  Glucose 70 - 99 mg/dL 914  782  956   BUN 8 - 23 mg/dL 24  46  45   Creatinine 0.44 - 1.00 mg/dL 2.13  0.86  5.78   Sodium 135 - 145 mmol/L 137  141  137   Potassium 3.5 - 5.1 mmol/L 4.5  4.2  3.9   Chloride 98 - 111 mmol/L 104  101  100   CO2 22 - 32 mmol/L 29  27  27    Calcium 8.9 - 10.3 mg/dL 9.0  8.6  8.5   Total Protein 6.5 - 8.1 g/dL 7.5     Total Bilirubin 0.0 - 1.2 mg/dL 0.3     Alkaline Phos 38 - 126 U/L 61     AST 15 - 41 U/L 11     ALT 0 -  44 U/L 9       RADIOGRAPHIC STUDIES: I have personally reviewed the radiological images as listed and agreed with the findings in the report. DG Abd 1 View Result Date: 11/20/2023 CLINICAL DATA:  Constipation EXAM: ABDOMEN - 1 VIEW COMPARISON:  June 02, 2022 FINDINGS:  The bowel gas pattern is normal. No radio-opaque calculi or other significant radiographic abnormality are seen. Double-J left internal ureteral stent in place in good position Moderate amount of residual fecal material particularly ascending colon and proximal transverse colon. No evidence of constipation or obstruction IMPRESSION: Negative. IMPRESSION: Moderate amount of residual fecal material particularly ascending colon and proximal transverse colon. No evidence of constipation or obstruction. Electronically Signed   By: Shaaron Adler M.D.   On: 11/20/2023 16:20    ASSESSMENT & PLAN:   78 year old woman with:   1.   Left ureteral cancer diagnosed in September 2020.her tumor appears to be superficial although no documentation of muscle sampling on cytology.    She is status post right nephroureterectomy for a distal ureteral carcinoma in 2008   She is status post cystoscopy and a stent placement in September 2020 and repeated in February 2021 for a left ureteral tumor.  The cytology showed high-grade urothelial carcinoma.   She is status post BCG treatment in 2021.  Subsequent cystoscopy in 2022 showed atypia without obvious urothelial carcinoma.  In July 2022 cytology showed high-grade urothelial carcinoma.   She is status post Jelmyto instillation under the care of Dr. Retta Diones in September 2022 was completed in November 2022.  Following cystoscopy in January 2023 did show some residual tumor by cytology.   In May 2023 she completed repeat Jelmyto infusion.    2.  Anemia: Appears to be improving at this time with her symptoms of dizziness and fatigue is improved.   3.  Chronic renal insufficiency: Creatinine clearance is around 20 cc/min likely contributing to her anemia.   PLAN: -Discussed lab results from today, 12/17/2023, in detail with the patient. CBC shows slightly low Platelets of 146 K. CMP shows slightly elevated BUN of 24 and elevated Creatinine of 3.03, and low AST level of  11.  B12 and copper levels WNL -Discussed CT scan results from 08/02/2023. Showed Left ureteral stent in expected location with persistent perinephric stranding, no hydronephrosis. Left para-aortic retroperitoneal adenopathy, increased since previous. -She has repeat CT scan in 2 days, 12/19/2023, with her Urologist. -Continue to follow-up with PCP, Gastroenterologist, and Urologist. -Answered all of patient's questions.   FOLLOW-UP: F/u with urology RTC in 6 months with labs , earlier based on CT results  The total time spent in the appointment was 20 minutes* .  All of the patient's questions were answered with apparent satisfaction. The patient knows to call the clinic with any problems, questions or concerns.   Wyvonnia Lora MD MS AAHIVMS [facility] Hematology/Oncology Physician [facility]  .*Total Encounter Time as defined by the Centers for Medicare and Medicaid Services includes, in addition to the face-to-face time of a patient visit (documented in the note above) non-face-to-face time: obtaining and reviewing outside history, ordering and reviewing medications, tests or procedures, care coordination (communications with other health care professionals or caregivers) and documentation in the medical record.   I,Param Shah,acting as a Neurosurgeon for Wyvonnia Lora, MD.,have documented all relevant documentation on the behalf of Wyvonnia Lora, MD,as directed by  Wyvonnia Lora, MD while in the presence of Wyvonnia Lora, MD.  .I have reviewed the above documentation  for accuracy and completeness, and I agree with the above. Johney Maine MD

## 2023-12-18 DIAGNOSIS — I10 Essential (primary) hypertension: Secondary | ICD-10-CM | POA: Diagnosis not present

## 2023-12-18 DIAGNOSIS — N184 Chronic kidney disease, stage 4 (severe): Secondary | ICD-10-CM | POA: Diagnosis not present

## 2023-12-18 DIAGNOSIS — F411 Generalized anxiety disorder: Secondary | ICD-10-CM | POA: Diagnosis not present

## 2023-12-19 DIAGNOSIS — Z905 Acquired absence of kidney: Secondary | ICD-10-CM | POA: Diagnosis not present

## 2023-12-19 DIAGNOSIS — R918 Other nonspecific abnormal finding of lung field: Secondary | ICD-10-CM | POA: Diagnosis not present

## 2023-12-19 DIAGNOSIS — C652 Malignant neoplasm of left renal pelvis: Secondary | ICD-10-CM | POA: Diagnosis not present

## 2023-12-19 DIAGNOSIS — C659 Malignant neoplasm of unspecified renal pelvis: Secondary | ICD-10-CM | POA: Diagnosis not present

## 2023-12-19 DIAGNOSIS — K4020 Bilateral inguinal hernia, without obstruction or gangrene, not specified as recurrent: Secondary | ICD-10-CM | POA: Diagnosis not present

## 2023-12-19 LAB — COPPER, SERUM: Copper: 110 ug/dL (ref 80–158)

## 2024-01-03 DIAGNOSIS — R8271 Bacteriuria: Secondary | ICD-10-CM | POA: Diagnosis not present

## 2024-01-03 DIAGNOSIS — C652 Malignant neoplasm of left renal pelvis: Secondary | ICD-10-CM | POA: Diagnosis not present

## 2024-01-03 DIAGNOSIS — Z905 Acquired absence of kidney: Secondary | ICD-10-CM | POA: Diagnosis not present

## 2024-01-03 DIAGNOSIS — N184 Chronic kidney disease, stage 4 (severe): Secondary | ICD-10-CM | POA: Diagnosis not present

## 2024-01-09 ENCOUNTER — Other Ambulatory Visit: Payer: Self-pay | Admitting: Physician Assistant

## 2024-01-09 ENCOUNTER — Other Ambulatory Visit: Payer: Self-pay | Admitting: Urology

## 2024-01-09 ENCOUNTER — Telehealth: Payer: Self-pay | Admitting: Internal Medicine

## 2024-01-09 DIAGNOSIS — E782 Mixed hyperlipidemia: Secondary | ICD-10-CM

## 2024-01-09 NOTE — Telephone Encounter (Signed)
   Patient Name: [name]  DOB: Feb 07, 1946 MRN: [id number]  Primary Cardiologist: Christell Constant, MD  Chart reviewed as part of pre-operative protocol coverage.   She may hold aspirin for 5-7 days prior to procedure. Please resume aspirin as soon as possible postprocedure, at the discretion of the surgeon.     Denyce Robert, NP 01/09/2024, 1:53 PM

## 2024-01-09 NOTE — Telephone Encounter (Signed)
   Pre-operative Risk Assessment    Patient Name: [name]  DOB: 1946/01/04 MRN: [id number]   Date of last office visit: 01/09/23 Date of next office visit: NA   Request for Surgical Clearance    Procedure:   Cystoscopy Left Retro Grade Pyelogram Ureteroscopy Laser Tumor Scent Exchange  Date of Surgery:  Clearance 01/30/24                                Surgeon:  Dr. Lucile Shutters Group or Practice Name:  [facility] Phone number:  [phone] Fax number:  [phone]   Type of Clearance Requested:   - Pharmacy:  Hold Aspirin     Type of Anesthesia:  General    Additional requests/questions:  Please advise surgeon/provider what medications should be held.  Signed, Belisicia T Woods   01/09/2024, 1:02 PM

## 2024-01-18 ENCOUNTER — Ambulatory Visit
Admission: RE | Admit: 2024-01-18 | Discharge: 2024-01-18 | Disposition: A | Discharge status: Home / Self Care | Payer: Medicare PPO | Source: Ambulatory Visit | Attending: Family Medicine | Admitting: Family Medicine

## 2024-01-18 DIAGNOSIS — E2839 Other primary ovarian failure: Secondary | ICD-10-CM

## 2024-01-18 DIAGNOSIS — M810 Age-related osteoporosis without current pathological fracture: Secondary | ICD-10-CM | POA: Diagnosis not present

## 2024-01-20 NOTE — Patient Instructions (Signed)
 SURGICAL WAITING ROOM VISITATION Patients having surgery or a procedure may have no more than 2 support people in the waiting area - these visitors may rotate in the visitor waiting room.   If the patient needs to stay at the hospital during part of their recovery, the visitor guidelines for inpatient rooms apply.  PRE-OP VISITATION  Pre-op nurse will coordinate an appropriate time for 1 support person to accompany the patient in pre-op.  This support person may not rotate.  This visitor will be contacted when the time is appropriate for the visitor to come back in the pre-op area.  Please refer to the [facility] website for the visitor guidelines for Inpatients (after your surgery is over and you are in a regular room).  You are not required to quarantine at this time prior to your surgery. However, you must do this: Hand Hygiene often Do NOT share personal items Notify your provider if you are in close contact with someone who has COVID or you develop fever 100.4 or greater, new onset of sneezing, cough, sore throat, shortness of breath or body aches.  If you test positive for Covid or have been in contact with anyone that has tested positive in the last 10 days please notify you surgeon.    Your procedure is scheduled on:  Wednesday  January 30, 2024  Report to [facility] Main Entrance: [address] entrance where the [employer] is available.   Report to admitting at:  05:15 AM  Call this number if you have any questions or problems the morning of surgery [phone]  DO NOT EAT OR DRINK ANYTHING AFTER MIDNIGHT THE NIGHT PRIOR TO YOUR SURGERY / PROCEDURE.   FOLLOW  ANY ADDITIONAL PRE OP INSTRUCTIONS YOU RECEIVED FROM YOUR SURGEON'S OFFICE!!!   Oral Hygiene is also important to reduce your risk of infection.        Remember - BRUSH YOUR TEETH THE MORNING OF SURGERY WITH YOUR REGULAR TOOTHPASTE  Do NOT smoke after Midnight the night before surgery.  STOP TAKING all  Vitamins, Herbs and supplements 1 week before your surgery.   Take ONLY these medicines the morning of surgery with A SIP OF WATER: carvedilol, sertraline, Isosorbide, and you may take Tylenol if needed for pain.  You may not have any metal on your body including hair pins, jewelry, and body piercing  Do not wear make-up, lotions, powders, perfumes or deodorant  Do not wear nail polish including gel and S&S, artificial / acrylic nails, or any other type of covering on natural nails including finger and toenails. If you have artificial nails, gel coating, etc., that needs to be removed by a nail salon, Please have this removed prior to surgery. Not doing so may mean that your surgery could be cancelled or delayed if the Surgeon or anesthesia staff feels like they are unable to monitor you safely.   Do not shave 48 hours prior to surgery to avoid nicks in your skin which may contribute to postoperative infections.   Contacts, Hearing Aids, dentures or bridgework may not be worn into surgery. DENTURES WILL BE REMOVED PRIOR TO SURGERY PLEASE DO NOT APPLY "Poly grip" OR ADHESIVES!!!   Patients discharged on the day of surgery will not be allowed to drive home.  Someone NEEDS to stay with you for the first 24 hours after anesthesia.  Do not bring your home medications to the hospital. The Pharmacy will dispense medications listed on your medication list to you during your  admission in the Hospital.  Special Instructions: Bring a copy of your healthcare power of attorney and living will documents the day of surgery, if you wish to have them scanned into your [facility] Records- EPIC  Please read over the following fact sheets you were given: IF YOU HAVE QUESTIONS ABOUT YOUR PRE-OP INSTRUCTIONS, PLEASE CALL [phone].   [facility] - Preparing for Surgery Before surgery, you can play an important role.  Because skin is not sterile, your skin needs to be as free of germs as possible.  You  can reduce the number of germs on your skin by washing with CHG (chlorahexidine gluconate) soap before surgery.  CHG is an antiseptic cleaner which kills germs and bonds with the skin to continue killing germs even after washing. Please DO NOT use if you have an allergy to CHG or antibacterial soaps.  If your skin becomes reddened/irritated stop using the CHG and inform your nurse when you arrive at Short Stay. Do not shave (including legs and underarms) for at least 48 hours prior to the first CHG shower.  You may shave your face/neck.  Please follow these instructions carefully:  1.  Shower with CHG Soap the night before surgery and the  morning of surgery.  2.  If you choose to wash your hair, wash your hair first as usual with your normal  shampoo.  3.  After you shampoo, rinse your hair and body thoroughly to remove the shampoo.                             4.  Use CHG as you would any other liquid soap.  You can apply chg directly to the skin and wash.  Gently with a scrungie or clean washcloth.  5.  Apply the CHG Soap to your body ONLY FROM THE NECK DOWN.   Do not use on face/ open                           Wound or open sores. Avoid contact with eyes, ears mouth and genitals (private parts).                       Wash face,  Genitals (private parts) with your normal soap.             6.  Wash thoroughly, paying special attention to the area where your  surgery  will be performed.  7.  Thoroughly rinse your body with warm water from the neck down.  8.  DO NOT shower/wash with your normal soap after using and rinsing off the CHG Soap.            9.  Pat yourself dry with a clean towel.            10.  Wear clean pajamas.            11.  Place clean sheets on your bed the night of your first shower and do not  sleep with pets.  ON THE DAY OF SURGERY : Do not apply any lotions/deodorants the morning of surgery.  Please wear clean clothes to the hospital/surgery center.    FAILURE TO FOLLOW  THESE INSTRUCTIONS MAY RESULT IN THE CANCELLATION OF YOUR SURGERY  PATIENT SIGNATURE_________________________________  NURSE SIGNATURE__________________________________  ________________________________________________________________________

## 2024-01-20 NOTE — Progress Notes (Signed)
 COVID Vaccine received:  [x]  No []  Yes Date of any COVID positive Test in last 90 days:  PCP - Denna Fish, MD at [facility] [phone]  Cardiologist - Gloriann Larger, MD , Palmer Bobo, NP, Preop clearance in 01-09-24  Epic note Oncologist- Jacquelyn Matt, MD  Nephrology-  Chest x-ray - 08-08-23  1v and 08-06-23  2v  epic EKG - 08-03-2023  Epic  Stress Test -06-30-2019  Epic  ECHO - 06-01-22 Epic Cardiac Cath - 11-06-2019  [facility] by Dr. Kay Parson,   Nonobstructive CAD  Bowel Prep - []  No  []   Yes ______  Pacemaker / ICD device []  No []  Yes   Spinal Cord Stimulator:[]  No []  Yes       History of Sleep Apnea? []  No []  Yes   CPAP used?- []  No []  Yes    Does the patient monitor blood sugar?   []  N/A   []  No []  Yes  Patient has: []  NO Hx DM   []  Pre-DM   []  DM1  []   DM2  Blood Thinner / Instructions:  none Aspirin Instructions:  ASA 81 mg  hold 5-7 days  ERAS Protocol Ordered: [x]  No  []  Yes Patient is to be NPO after: midnight prior  Dental hx: []  Dentures:  []  N/A      []  Bridge or Partial:                   []  Loose or Damaged teeth:   Comments:   Activity level: Can go up a flight of stairs and perform activities of daily living without stopping and without symptoms of chest pain or shortness of breath.   Anesthesia review: HTN, CAD, HFrEF, hx PE- Eliquis x 3 months), anemia, CKD4- only has 1 kidney d/t right nephroureterectomy for cancer 2008, gad    Patient denies shortness of breath, fever, cough and chest pain at PAT appointment   Patient verbalized understanding of instructions that were given to them at the PAT appointment. Patient was also instructed that they will need to review over the PAT instructions again at home before surgery.

## 2024-01-22 ENCOUNTER — Encounter (HOSPITAL_COMMUNITY)
Admission: RE | Admit: 2024-01-22 | Discharge: 2024-01-22 | Disposition: A | Discharge status: Home / Self Care | Source: Ambulatory Visit | Attending: Urology | Admitting: Urology

## 2024-01-22 ENCOUNTER — Other Ambulatory Visit: Payer: Self-pay

## 2024-01-22 ENCOUNTER — Encounter (HOSPITAL_COMMUNITY): Payer: Self-pay

## 2024-01-22 VITALS — BP 140/66 | HR 58 | Temp 97.6°F | Resp 16 | Ht 63.0 in | Wt 173.0 lb

## 2024-01-22 DIAGNOSIS — I2510 Atherosclerotic heart disease of native coronary artery without angina pectoris: Secondary | ICD-10-CM | POA: Diagnosis not present

## 2024-01-22 DIAGNOSIS — C652 Malignant neoplasm of left renal pelvis: Secondary | ICD-10-CM | POA: Insufficient documentation

## 2024-01-22 DIAGNOSIS — N289 Disorder of kidney and ureter, unspecified: Secondary | ICD-10-CM | POA: Diagnosis not present

## 2024-01-22 DIAGNOSIS — Z01818 Encounter for other preprocedural examination: Secondary | ICD-10-CM | POA: Diagnosis not present

## 2024-01-22 HISTORY — DX: Unspecified osteoarthritis, unspecified site: M19.90

## 2024-01-22 HISTORY — DX: Cardiac arrhythmia, unspecified: I49.9

## 2024-01-22 HISTORY — DX: Personal history of urinary calculi: Z87.442

## 2024-01-22 LAB — BASIC METABOLIC PANEL WITH GFR
Anion gap: 10 (ref 5–15)
BUN: 34 mg/dL — ABNORMAL HIGH (ref 8–23)
CO2: 26 mmol/L (ref 22–32)
Calcium: 9.2 mg/dL (ref 8.9–10.3)
Chloride: 101 mmol/L (ref 98–111)
Creatinine, Ser: 3.15 mg/dL — ABNORMAL HIGH (ref 0.44–1.00)
GFR, Estimated: 15 mL/min — ABNORMAL LOW (ref >60)
Glucose, Bld: 130 mg/dL — ABNORMAL HIGH (ref 70–99)
Potassium: 3.9 mmol/L (ref 3.5–5.1)
Sodium: 137 mmol/L (ref 135–145)

## 2024-01-22 LAB — CBC
HCT: 40.9 % (ref 36.0–46.0)
Hemoglobin: 12.7 g/dL (ref 12.0–15.0)
MCH: 29.8 pg (ref 26.0–34.0)
MCHC: 31.1 g/dL (ref 30.0–36.0)
MCV: 96 fL (ref 80.0–100.0)
Platelets: 164 10*3/uL (ref 150–400)
RBC: 4.26 MIL/uL (ref 3.87–5.11)
RDW: 14.2 % (ref 11.5–15.5)
WBC: 7.5 10*3/uL (ref 4.0–10.5)
nRBC: 0 % (ref 0.0–0.2)

## 2024-01-23 NOTE — Progress Notes (Signed)
 Anesthesia Chart Review   Case: [id number] Date/Time: 01/30/24 0715   Procedures:      CYSTOSCOPY/URETEROSCOPY/HOLMIUM LASER/STENT PLACEMENT (Left)     CYSTOSCOPY, WITH RETROGRADE PYELOGRAM (Left)   Anesthesia type: General   Diagnosis: Cancer of renal pelvis, left (HCC) [C65.2]   Pre-op diagnosis: LEFT RENAL PELVIS CANCER   Location: [facility] PROCEDURE ROOM / [facility]   Surgeons: Melody Spurling., MD       DISCUSSION:78 y.o. 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 Osborn Blaze.   She follows with Cardiology for hx of nonobstructive CAD, CHF, HTN, HLD. Last seen in clinic on 01/09/23. Noted to be doing well. Hydralazine was previously stopped due to orthostatic hypotension. BP controlled on current regimen.  Cardio reviewed chart as part of preop eval. Ok to hold asa prior to procedure per notes, no other recommendations given.  VS: BP (!) 140/66 Comment: right arm sitting  Pulse (!) 58   Temp 36.4 C (Oral)   Resp 16   Ht 5\' 3"  (1.6 m)   Wt 78.5 kg   SpO2 96%   BMI 30.65 kg/m   PROVIDERS: Ransom Byers, MD is PCP   Cardiologist - Gloriann Larger, MD  LABS: Labs reviewed: Acceptable for surgery. (all labs ordered are listed, but only abnormal results are displayed)  Labs Reviewed  BASIC METABOLIC PANEL WITH GFR - Abnormal; Notable for the following components:      Result Value   Glucose, Bld 130 (*)    BUN 34 (*)    Creatinine, Ser 3.15 (*)    GFR, Estimated 15 (*)    All other components within normal limits  CBC     IMAGES:   EKG:   CV: Echo 06/01/2022 1. Left ventricular ejection fraction, by estimation, is 60 to 65%. Left  ventricular ejection fraction by 3D volume is 63 %. The left ventricle has  normal function. The left ventricle has no regional  wall motion  abnormalities. Left ventricular diastolic   parameters are consistent with Grade I diastolic dysfunction (impaired  relaxation).   2. Right ventricular systolic function is normal. The right ventricular  size is normal. There is normal pulmonary artery systolic pressure. The  estimated right ventricular systolic pressure is 35.5 mmHg.   3. The mitral valve is grossly normal. Mild mitral valve regurgitation.   4. The aortic valve is tricuspid. There is mild calcification of the  aortic valve. There is mild thickening of the aortic valve. Aortic valve  regurgitation is not visualized. Aortic valve sclerosis/calcification is  present, without any evidence of  aortic stenosis.   5. There is Severe (Grade IV) plaque involving the ascending aorta.   6. The inferior vena cava is normal in size with <50% respiratory  variability, suggesting right atrial pressure of 8 mmHg.  Past Medical History:  Diagnosis Date   Anemia associated with chronic renal failure    Arthritis    Blood dyscrasia 2008   hx of PE   CAD in native artery 10/2019   cardiologist-   dr Melven Stable. Paulita Boss;   a. 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 [facility]) 06/2019   urologist--- dr dahlstedt/  oncologist--- dr Dirk Fredericks;   dx 09/ 2020 high grade urothelial  s/p laser ablation, ;  recurrent s/p BCG instillation,  completed chemo instilation 11/ 2022  and repeat chemo completed 05/ 2023   Chronic combined systolic and diastolic CHF (congestive heart failure) (HCC) 06/2019   followed by cardiology;   dx 09/ 2022 in setting of sepsis, pulm edema;   05/ 2022  ef 40-45% per echo,  recovered per cath 01/ 2021 ef 50%;  laste echo 08/ 2023  ef 60-65%   CKD (chronic kidney disease), stage III (HCC)    Depression    Dysrhythmia    bradycardia   GAD (generalized anxiety disorder)    GERD (gastroesophageal reflux disease)    History of  bladder cancer 12/2006   followed by dr Joie Narrow   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   dx 04/ 2008  w/ poor function hydronephrotic  right kidney;   02-06-2007  s/p right nephroureterectomy   History of kidney stones    History of pulmonary embolism 04/2022   in setting severe sepsis;  small RLL, treated w/ 3 months eliquis   Hyperlipidemia, mixed    Hypertension    Solitary kidney, acquired 02/06/2007   s/p  right nephroureterectomy for cancer    Past Surgical History:  Procedure Laterality Date   CATARACT EXTRACTION W/ INTRAOCULAR LENS IMPLANT Bilateral 2011   CYSTOSCOPY W/ URETERAL STENT PLACEMENT Left 11/17/2019   Procedure: CYSTOSCOPY WITH STENT REPLACEMENT;  Surgeon: Trent Frizzle, MD;  Location: [facility];  Service: Urology;  Laterality: Left;   CYSTOSCOPY W/ URETERAL STENT PLACEMENT Left 03/23/2022   Procedure: CYSTOSCOPY WITH RETROGRADE PYELOGRAM, LEFT URETERAL STENT PLACEMENT;  Surgeon: Trent Frizzle, MD;  Location: [facility];  Service: Urology;  Laterality: Left;   CYSTOSCOPY W/ URETERAL STENT PLACEMENT Left 06/09/2022   Procedure: CYSTOSCOPY WITH LEFT STENT EXCHANGE;  Surgeon: Trent Frizzle, MD;  Location: [facility];  Service: Urology;  Laterality: Left;   CYSTOSCOPY W/ URETERAL STENT PLACEMENT Left 12/04/2022   Procedure: CYSTOSCOPY WITH STENT REPLACEMENT;  Surgeon: Trent Frizzle, MD;  Location: [facility];  Service: Urology;  Laterality: Left;   CYSTOSCOPY W/ URETERAL STENT REMOVAL Left 05/10/2020   Procedure: CYSTOSCOPY WITH STENT REMOVAL;  Surgeon: Trent Frizzle, MD;  Location: [facility];  Service: Urology;  Laterality: Left;   CYSTOSCOPY W/ URETERAL STENT REMOVAL Left 05/02/2021   Procedure: CYSTOSCOPY WITH STENT REMOVAL;  Surgeon: Trent Frizzle, MD;  Location: [facility];  Service: Urology;  Laterality: Left;   CYSTOSCOPY W/ URETERAL STENT REMOVAL Left 11/16/2022    Procedure: CYSTOSCOPY WITH STENT REMOVAL;  Surgeon: Trent Frizzle, MD;  Location: [facility];  Service: Urology;  Laterality: Left;   CYSTOSCOPY WITH RETROGRADE PYELOGRAM, URETEROSCOPY AND STENT PLACEMENT Left 06/27/2019   Procedure: CYSTOSCOPY WITH LEFT RETROGRADE PYELOGRAM, URETEROSCOPY, BIOPSY AND LEFT STENT PLACEMENT;  Surgeon: Homero Luster, MD;  Location: [facility];  Service: Urology;  Laterality: Left;   CYSTOSCOPY WITH RETROGRADE PYELOGRAM, URETEROSCOPY AND STENT PLACEMENT Left 08/18/2019   Procedure: CYSTOSCOPY, URETEROSCOPY AND STENT EXCHANGE;  Surgeon: Trent Frizzle, MD;  Location[facility];  Service: Urology;  Laterality: Left;  90 MINS   CYSTOSCOPY WITH RETROGRADE PYELOGRAM, URETEROSCOPY AND STENT PLACEMENT Left 11/17/2019   Procedure: CYSTOSCOPY WITH RETROGRADE PYELOGRAM, URETEROSCOPY AND STENT PLACEMENT;  Surgeon: Trent Frizzle, MD;  Location[facility];  Service: Urology;  Laterality: Left;  90 MINS   CYSTOSCOPY WITH RETROGRADE PYELOGRAM, URETEROSCOPY AND STENT PLACEMENT Left 05/10/2020   Procedure: CYSTOSCOPY WITH RETROGRADE PYELOGRAM,  URETEROSCOPY AND STENT PLACEMENT WITH URETHRAL DIALATION AND BRUSH BIOPSY;  Surgeon: Marcine Matar, MD;  Location: [facility];  Service: Urology;  Laterality: Left;  1 HR   CYSTOSCOPY WITH RETROGRADE PYELOGRAM, URETEROSCOPY AND STENT PLACEMENT Left 09/16/2020   Procedure: CYSTOSCOPY WITH RETROGRADE PYELOGRAM, URETEROSCOPY ,  LITHOPAXY, LEFT URETERAL BRUSHING, AND STENT REPLACEMENT;  Surgeon: Marcine Matar, MD;  Location: [facility];  Service: Urology;  Laterality: Left;   CYSTOSCOPY WITH RETROGRADE PYELOGRAM, URETEROSCOPY AND STENT PLACEMENT Left 10/24/2021   Procedure: CYSTOSCOPY WITH RETROGRADE PYELOGRAM, URETEROSCOPY, POSSIBLE URETERAL AND RENAL BIOPSIES AND STENT PLACEMENT;  Surgeon: Marcine Matar, MD;  Location: [facility];  Service: Urology;   Laterality: Left;  1 HR   CYSTOSCOPY WITH RETROGRADE PYELOGRAM, URETEROSCOPY AND STENT PLACEMENT Left 03/09/2022   Procedure: CYSTOSCOPY WITH ANTEGRADE PYELOGRAM,  STENT REMOVAL;  Surgeon: Marcine Matar, MD;  Location[facility];  Service: Urology;  Laterality: Left;   CYSTOSCOPY WITH RETROGRADE PYELOGRAM, URETEROSCOPY AND STENT PLACEMENT  01/09/2007   @[facility]  by dr Retta Diones;    bx's/ washing  bladder , right ureter   CYSTOSCOPY WITH RETROGRADE PYELOGRAM, URETEROSCOPY AND STENT PLACEMENT Left 11/16/2022   Procedure: CYSTOSCOPY WITH RETROGRADE PYELOGRAM, URETEROSCOPY AND STENT REPLACEMENT;  Surgeon: Marcine Matar, MD;  Location: [facility];  Service: Urology;  Laterality: Left;  90 MINS   CYSTOSCOPY WITH RETROGRADE PYELOGRAM, URETEROSCOPY AND STENT PLACEMENT Left 07/06/2023   Procedure: CYSTOSCOPY WITH LEFT RETROGRADE PYELOGRAM, URETEROSCOPY, LASER ABLATION OF TUMOR AND STENT EXCHANGE;  Surgeon: Loletta Parish., MD;  Location: [facility];  Service: Urology;  Laterality: Left;  60 MINUTES NEEDED FOR CASE   CYSTOSCOPY/URETEROSCOPY/HOLMIUM LASER Left 12/04/2022   Procedure: CYSTOSCOPY/URETEROSCOPY/  HOLMIUM LASER OF RENAL PELVIC LESIONS;  Surgeon: Marcine Matar, MD;  Location: [facility];  Service: Urology;  Laterality: Left;   CYSTOSCOPY/URETEROSCOPY/HOLMIUM LASER/STENT PLACEMENT Left 05/02/2021   Procedure: CYSTOSCOPY/URETEROSCOPY WITH BRUSH BIOPSY/ RETROGRADE PYELOGRAM/ HOLMIUM LASER/STENT REPLACEMENT;  Surgeon: Marcine Matar, MD;  Location: [facility];  Service: Urology;  Laterality: Left;   HOLMIUM LASER APPLICATION Left 09/16/2020   Procedure: HOLMIUM LASER APPLICATION;  Surgeon: Marcine Matar, MD;  Location: [facility];  Service: Urology;  Laterality: Left;   HOLMIUM LASER APPLICATION Left 10/24/2021   Procedure: HOLMIUM LASER APPLICATION OF TUMORS;  Surgeon: Marcine Matar, MD;  Location:  [facility];  Service: Urology;  Laterality: Left;   HOLMIUM LASER APPLICATION Left 11/16/2022   Procedure: HOLMIUM LASER OF TUMORS;  Surgeon: Marcine Matar, MD;  Location: [facility];  Service: Urology;  Laterality: Left;   IR NEPHROSTOMY PLACEMENT LEFT  07/06/2021   IR NEPHROSTOMY PLACEMENT LEFT  02/06/2022   NEPHROSTOMY TUBE REMOVAL Left 03/23/2022   Procedure: NEPHROSTOMY TUBE REMOVAL;  Surgeon: Marcine Matar, MD;  Location: [facility];  Service: Urology;  Laterality: Left;   NEPHROURETERECTOMY Right 02/06/2007   @[facility]  by dr Retta Diones;   Laparoscopic   RIGHT/LEFT HEART CATH AND CORONARY ANGIOGRAPHY N/A 11/06/2019   Procedure: RIGHT/LEFT HEART CATH AND CORONARY ANGIOGRAPHY;  Surgeon: Lyn Records, MD;  Location: [facility] INVASIVE CV LAB;  Service: Cardiovascular;  Laterality: N/A;   THULIUM LASER TURP (TRANSURETHRAL RESECTION OF PROSTATE) Left 08/18/2019   Procedure: THULIUM LASER ABLATION OF URETERAL TUMOR;  Surgeon: Marcine Matar, MD;  Location: [facility];  Service: Urology;  Laterality: Left;   THULIUM LASER TURP (TRANSURETHRAL RESECTION OF PROSTATE) Left 11/17/2019   Procedure: THULIUM LASER of URETERAL  CANCER;  Surgeon: Trent Frizzle, MD;  Location: [facility];  Service: Urology;  Laterality: Left;   TRANSURETHRAL RESECTION OF BLADDER TUMOR  12/12/2006   @[facility]  by dr Milon Aloe   VAGINAL HYSTERECTOMY  1980    MEDICATIONS:  acetaminophen (TYLENOL) 500 MG tablet   aspirin EC 81 MG tablet   calcium carbonate (TUMS EX) 750 MG chewable tablet   carvedilol (COREG) 6.25 MG tablet   cholecalciferol (VITAMIN D3) 25 MCG (1000 UNIT) tablet   Ferrous Sulfate (IRON PO)   icosapent Ethyl (VASCEPA) 1 g capsule   isosorbide mononitrate (IMDUR) 30 MG 24 hr tablet   rosuvastatin (CRESTOR) 40 MG tablet   sertraline (ZOLOFT) 50 MG tablet   traZODone (DESYREL) 150 MG tablet   No current facility-administered medications for this  encounter.   Chick Cotton Ward, PA-C [facility] [phone]

## 2024-01-25 ENCOUNTER — Telehealth: Payer: Self-pay | Admitting: Hematology

## 2024-01-25 NOTE — Telephone Encounter (Signed)
Spoke to patient confirming upcoming appointment

## 2024-01-28 DIAGNOSIS — N184 Chronic kidney disease, stage 4 (severe): Secondary | ICD-10-CM | POA: Diagnosis not present

## 2024-01-29 NOTE — Anesthesia Preprocedure Evaluation (Signed)
 Anesthesia Evaluation  Patient identified by MRN, date of birth, ID band Patient awake    Reviewed: Allergy & Precautions, NPO status , Patient's Chart, lab work & pertinent test results, reviewed documented beta blocker date and time   History of Anesthesia Complications Negative for: history of anesthetic complications  Airway Mallampati: II  TM Distance: >3 FB Neck ROM: Full    Dental  (+) Dental Advisory Given   Pulmonary former smoker, PE   Pulmonary exam normal        Cardiovascular hypertension, Pt. on medications and Pt. on home beta blockers + CAD  Normal cardiovascular exam+ dysrhythmias    '23 TTE - EF 60-65%. Grade I diastolic dysfunction (impaired relaxation). Mild mitral valve regurgitation. There is Severe (Grade IV) plaque involving the ascending aorta.     Neuro/Psych  PSYCHIATRIC DISORDERS Anxiety Depression    negative neurological ROS     GI/Hepatic Neg liver ROS,GERD  Controlled and Medicated,,  Endo/Other   Obesity   Renal/GU CRFRenal disease    Bladder cancer     Musculoskeletal  (+) Arthritis ,    Abdominal   Peds  Hematology negative hematology ROS (+)   Anesthesia Other Findings   Reproductive/Obstetrics                             Anesthesia Physical Anesthesia Plan  ASA: 3  Anesthesia Plan: General   Post-op Pain Management: Tylenol  PO (pre-op)*   Induction: Intravenous  PONV Risk Score and Plan: 3 and Treatment may vary due to age or medical condition, Ondansetron  and Dexamethasone   Airway Management Planned: LMA  Additional Equipment: None  Intra-op Plan:   Post-operative Plan: Extubation in OR  Informed Consent: I have reviewed the patients History and Physical, chart, labs and discussed the procedure including the risks, benefits and alternatives for the proposed anesthesia with the patient or authorized representative who has indicated  his/her understanding and acceptance.     Dental advisory given  Plan Discussed with: CRNA and Anesthesiologist  Anesthesia Plan Comments:         Anesthesia Quick Evaluation

## 2024-01-30 ENCOUNTER — Other Ambulatory Visit: Payer: Self-pay

## 2024-01-30 ENCOUNTER — Encounter (HOSPITAL_COMMUNITY)
Admission: RE | Disposition: A | Discharge status: Home / Self Care | Payer: Self-pay | Source: Ambulatory Visit | Attending: Urology

## 2024-01-30 ENCOUNTER — Ambulatory Visit (HOSPITAL_COMMUNITY): Payer: Self-pay | Admitting: Physician Assistant

## 2024-01-30 ENCOUNTER — Ambulatory Visit (HOSPITAL_COMMUNITY)
Admission: RE | Admit: 2024-01-30 | Discharge: 2024-01-30 | Disposition: A | Discharge status: Home / Self Care | Source: Ambulatory Visit | Attending: Urology | Admitting: Urology

## 2024-01-30 ENCOUNTER — Encounter (HOSPITAL_COMMUNITY): Payer: Self-pay | Admitting: Urology

## 2024-01-30 ENCOUNTER — Other Ambulatory Visit: Payer: Self-pay | Admitting: Internal Medicine

## 2024-01-30 ENCOUNTER — Ambulatory Visit (HOSPITAL_BASED_OUTPATIENT_CLINIC_OR_DEPARTMENT_OTHER): Admitting: Anesthesiology

## 2024-01-30 ENCOUNTER — Ambulatory Visit (HOSPITAL_COMMUNITY)

## 2024-01-30 DIAGNOSIS — Z8559 Personal history of malignant neoplasm of other urinary tract organ: Secondary | ICD-10-CM | POA: Insufficient documentation

## 2024-01-30 DIAGNOSIS — I5042 Chronic combined systolic (congestive) and diastolic (congestive) heart failure: Secondary | ICD-10-CM | POA: Diagnosis not present

## 2024-01-30 DIAGNOSIS — C652 Malignant neoplasm of left renal pelvis: Secondary | ICD-10-CM

## 2024-01-30 DIAGNOSIS — N184 Chronic kidney disease, stage 4 (severe): Secondary | ICD-10-CM | POA: Diagnosis not present

## 2024-01-30 DIAGNOSIS — Z79899 Other long term (current) drug therapy: Secondary | ICD-10-CM | POA: Insufficient documentation

## 2024-01-30 DIAGNOSIS — Z905 Acquired absence of kidney: Secondary | ICD-10-CM | POA: Diagnosis not present

## 2024-01-30 DIAGNOSIS — Z87891 Personal history of nicotine dependence: Secondary | ICD-10-CM | POA: Insufficient documentation

## 2024-01-30 DIAGNOSIS — K219 Gastro-esophageal reflux disease without esophagitis: Secondary | ICD-10-CM | POA: Insufficient documentation

## 2024-01-30 DIAGNOSIS — I130 Hypertensive heart and chronic kidney disease with heart failure and stage 1 through stage 4 chronic kidney disease, or unspecified chronic kidney disease: Secondary | ICD-10-CM | POA: Diagnosis not present

## 2024-01-30 DIAGNOSIS — C662 Malignant neoplasm of left ureter: Secondary | ICD-10-CM | POA: Diagnosis not present

## 2024-01-30 DIAGNOSIS — N135 Crossing vessel and stricture of ureter without hydronephrosis: Secondary | ICD-10-CM | POA: Diagnosis not present

## 2024-01-30 DIAGNOSIS — I2510 Atherosclerotic heart disease of native coronary artery without angina pectoris: Secondary | ICD-10-CM | POA: Insufficient documentation

## 2024-01-30 DIAGNOSIS — I10 Essential (primary) hypertension: Secondary | ICD-10-CM | POA: Diagnosis not present

## 2024-01-30 HISTORY — PX: CYSTOSCOPY W/ RETROGRADES: SHX1426

## 2024-01-30 HISTORY — PX: CYSTOSCOPY/URETEROSCOPY/HOLMIUM LASER/STENT PLACEMENT: SHX6546

## 2024-01-30 SURGERY — CYSTOSCOPY/URETEROSCOPY/HOLMIUM LASER/STENT PLACEMENT
Anesthesia: General | Laterality: Left

## 2024-01-30 MED ORDER — FENTANYL CITRATE PF 50 MCG/ML IJ SOSY
25.0000 ug | PREFILLED_SYRINGE | INTRAMUSCULAR | Status: DC | PRN
  Administered 2024-01-30: 50 ug via INTRAVENOUS

## 2024-01-30 MED ORDER — DEXAMETHASONE SODIUM PHOSPHATE 10 MG/ML IJ SOLN
INTRAMUSCULAR | Status: DC | PRN
  Administered 2024-01-30: 10 mg via INTRAVENOUS

## 2024-01-30 MED ORDER — EPHEDRINE SULFATE-NACL 50-0.9 MG/10ML-% IV SOSY
PREFILLED_SYRINGE | INTRAVENOUS | Status: DC | PRN
  Administered 2024-01-30 (×2): 10 mg via INTRAVENOUS

## 2024-01-30 MED ORDER — IOHEXOL 300 MG/ML  SOLN
INTRAMUSCULAR | Status: DC | PRN
  Administered 2024-01-30: 20 mL via URETHRAL

## 2024-01-30 MED ORDER — DEXAMETHASONE SODIUM PHOSPHATE 10 MG/ML IJ SOLN
INTRAMUSCULAR | Status: AC
  Filled 2024-01-30: qty 1

## 2024-01-30 MED ORDER — LIDOCAINE HCL (PF) 2 % IJ SOLN
INTRAMUSCULAR | Status: AC
  Filled 2024-01-30: qty 5

## 2024-01-30 MED ORDER — LIDOCAINE HCL (PF) 2 % IJ SOLN
INTRAMUSCULAR | Status: DC | PRN
  Administered 2024-01-30: 100 mg via INTRADERMAL

## 2024-01-30 MED ORDER — ONDANSETRON HCL 4 MG/2ML IJ SOLN: 4.0000 mg | Freq: Once | INTRAMUSCULAR | Status: DC | PRN

## 2024-01-30 MED ORDER — ACETAMINOPHEN 500 MG PO TABS
1000.0000 mg | ORAL_TABLET | Freq: Once | ORAL | Status: AC
  Administered 2024-01-30: 1000 mg via ORAL
  Filled 2024-01-30: qty 2

## 2024-01-30 MED ORDER — ONDANSETRON HCL 4 MG/2ML IJ SOLN
INTRAMUSCULAR | Status: DC | PRN
Start: 2024-01-30 — End: 2024-01-30
  Administered 2024-01-30: 4 mg via INTRAVENOUS

## 2024-01-30 MED ORDER — CHLORHEXIDINE GLUCONATE 0.12 % MT SOLN
15.0000 mL | Freq: Once | OROMUCOSAL | Status: AC
  Administered 2024-01-30: 15 mL via OROMUCOSAL

## 2024-01-30 MED ORDER — FENTANYL CITRATE (PF) 100 MCG/2ML IJ SOLN
INTRAMUSCULAR | Status: AC
  Filled 2024-01-30: qty 2

## 2024-01-30 MED ORDER — SODIUM CHLORIDE 0.9 % IR SOLN
Status: DC | PRN
  Administered 2024-01-30: 1000 mL via INTRAVESICAL

## 2024-01-30 MED ORDER — PROPOFOL 10 MG/ML IV BOLUS
INTRAVENOUS | Status: AC
  Filled 2024-01-30: qty 20

## 2024-01-30 MED ORDER — OXYCODONE HCL 5 MG/5ML PO SOLN: 5.0000 mg | Freq: Once | ORAL | Status: DC | PRN

## 2024-01-30 MED ORDER — ORAL CARE MOUTH RINSE: 15.0000 mL | Freq: Once | OROMUCOSAL | Status: AC

## 2024-01-30 MED ORDER — SODIUM CHLORIDE 0.9 % IV SOLN
1.0000 g | INTRAVENOUS | Status: AC
  Administered 2024-01-30: 2 g via INTRAVENOUS
  Filled 2024-01-30: qty 10

## 2024-01-30 MED ORDER — GLYCOPYRROLATE 0.2 MG/ML IJ SOLN
INTRAMUSCULAR | Status: DC | PRN
  Administered 2024-01-30: .2 mg via INTRAVENOUS

## 2024-01-30 MED ORDER — LACTATED RINGERS IV SOLN
INTRAVENOUS | Status: DC
Start: 2024-01-30 — End: 2024-01-30

## 2024-01-30 MED ORDER — PROPOFOL 10 MG/ML IV BOLUS
INTRAVENOUS | Status: DC | PRN
  Administered 2024-01-30: 30 mg via INTRAVENOUS

## 2024-01-30 MED ORDER — FENTANYL CITRATE PF 50 MCG/ML IJ SOSY
PREFILLED_SYRINGE | INTRAMUSCULAR | Status: AC
  Filled 2024-01-30: qty 1

## 2024-01-30 MED ORDER — OXYCODONE HCL 5 MG PO TABS: 5.0000 mg | ORAL_TABLET | Freq: Once | ORAL | Status: DC | PRN

## 2024-01-30 MED ORDER — TRAMADOL HCL 50 MG PO TABS: 50.0000 mg | ORAL_TABLET | Freq: Four times a day (QID) | ORAL | 0 refills | Status: DC | PRN

## 2024-01-30 MED ORDER — ONDANSETRON HCL 4 MG/2ML IJ SOLN
INTRAMUSCULAR | Status: AC
  Filled 2024-01-30: qty 2

## 2024-01-30 MED ORDER — FENTANYL CITRATE (PF) 100 MCG/2ML IJ SOLN
INTRAMUSCULAR | Status: DC | PRN
  Administered 2024-01-30: 25 ug via INTRAVENOUS

## 2024-01-30 SURGICAL SUPPLY — 21 items
BAG URO CATCHER STRL LF (MISCELLANEOUS) ×1 IMPLANT
BASKET LASER NITINOL 1.9FR (BASKET) IMPLANT
CATH URETL OPEN END 6FR 70 (CATHETERS) ×1 IMPLANT
CLOTH BEACON ORANGE TIMEOUT ST (SAFETY) ×1 IMPLANT
EXTRACTOR STONE 1.7FRX115CM (UROLOGICAL SUPPLIES) IMPLANT
GLOVE SURG LX STRL 7.5 STRW (GLOVE) ×1 IMPLANT
GOWN STRL REUS W/ TWL XL LVL3 (GOWN DISPOSABLE) ×1 IMPLANT
GUIDEWIRE ANG ZIPWIRE 038X150 (WIRE) ×1 IMPLANT
GUIDEWIRE STR DUAL SENSOR (WIRE) ×1 IMPLANT
KIT TURNOVER KIT A (KITS) IMPLANT
LASER FIB FLEXIVA PULSE ID 365 (Laser) IMPLANT
MANIFOLD NEPTUNE II (INSTRUMENTS) ×1 IMPLANT
NS IRRIG 1000ML POUR BTL (IV SOLUTION) IMPLANT
PACK CYSTO (CUSTOM PROCEDURE TRAY) ×1 IMPLANT
SHEATH NAVIGATOR HD 11/13X28 (SHEATH) IMPLANT
SHEATH NAVIGATOR HD 11/13X36 (SHEATH) IMPLANT
STENT CONTOUR 7FRX24 (STENTS) IMPLANT
TRACTIP FLEXIVA PULS ID 200XHI (Laser) IMPLANT
TUBE PU 8FR 16IN ENFIT (TUBING) ×1 IMPLANT
TUBING CONNECTING 10 (TUBING) ×1 IMPLANT
TUBING UROLOGY SET (TUBING) ×1 IMPLANT

## 2024-01-30 NOTE — Op Note (Unsigned)
 [name], CERCONE MEDICAL RECORD NO: [id number] ACCOUNT NO: [id number] DATE OF BIRTH: 02-19-1946 FACILITY: [facility] LOCATION: [facility] PHYSICIAN: Osborn Blaze, MD  Operative Report   DATE OF PROCEDURE: 01/30/2024  PREOPERATIVE DIAGNOSIS:  History of left renal pelvis and ureteral cancer.  PROCEDURE PERFORMED: 1.  Cystoscopy with left retrograde pyelogram interpretation. 2.  Left diagnostic ureteroscopy. 3.  Exchange left ureteral stent.  FINDINGS: 1.  Left proximal ureteral narrowing, likely consistent with multiple treatment effect. 2.  No evidence of intraluminal papillary tumor within the renal pelvis or ureter. 3.  Successful exchange of left ureteral stent, proximal end in renal pelvis, distal end in urinary bladder.  INDICATIONS:  The patient is a very pleasant 78 year old lady with a history of multifocal urothelial carcinoma.  She is status post right nephroureterectomy previously.  She also has an extensive history of left-sided renal pelvis urothelial carcinoma,  status post multiple endoscopic and topical therapies including mitomycin  instillation in antegrade fashion x 2 rounds.  She has been very compliant with surveillance now every 6 months.  She presents for this today.  Informed consent was obtained and  placed in the medical record.  DESCRIPTION OF PROCEDURE:  The patient being verified, procedure being cystoscopy, left retrograde, ureteroscopy, possible laser ablation, stent exchange confirmed.  Procedure timeout was performed.  Intravenous antibiotics administered.  General  anesthesia was introduced.  The patient was placed into a low lithotomy position.  A sterile field was created, prepped and draped the patient's vagina, introitus and proximal thigh using iodine.  Cystourethroscopy was performed using 21-French rigid  cystoscope with offset lens.  Inspection of bladder revealed no diverticula, calcifications, or papillary lesions.  The distal end of left  ureteral stent was in situ.  There was mild to moderate encrustation, but still appeared patent.  The stent was  grasped, brought out in its entirety, set aside for discard and the left ureteral orifice was cannulated with a 6-French end-hole catheter and left retrograde pyelogram was obtained.  Left retrograde pyelogram demonstrated a single left ureter, single system left kidney.  No filling defects or narrowing noted.  A ZIPwire was advanced to the level of the upper pole and set aside as a safety wire. An 8-French feeding tube placed in the  urinary bladder for pressure release.  Semi-rigid ureteroscopy was performed in the distal four-fifths of left ureter alongside a separate sensor working wire.  In the upper reaches of the semi-rigid scope, there was some relative narrowing of the ureter  but without obvious stricturing.  No papillary tumor noted.  The semi-rigid scope was then exchanged for a short-length ureteral access sheath at the level of the proximal ureter using continuous fluoroscopic guidance and a flexible digital ureteroscopy  was performed of proximal left ureter and systematic inspection of the left kidney including the calyces x3.  There was some patchy erythema that appeared to be most consistent with just stent irritation, but no obvious papillary tumor, whatsoever.   This was quite favorable.  The area of the UPJ and proximal ureter did have some relatively long segment narrowing but without focal stricturing.  This was felt to likely reflect treatment effect from multiple instrumentations.  Given the solitary  kidney, I felt that exchange of the stent would be prudent.  The access sheath was removed under continuous vision and a new 7 x 24 Contour stent was carefully placed using fluoroscopic guidance.  Good proximal and distal planes were noted.  The  procedure was terminated.  The  patient tolerated the procedure well.  No periprocedural complications.  The patient was taken to  the postanesthesia care in stable condition.  Plan for discharge home.   SHW D: 01/30/2024 7:59:06 am T: 01/30/2024 8:44:00 am  JOB: [id number]/ [id number]

## 2024-01-30 NOTE — Brief Op Note (Signed)
 01/30/2024  7:54 AM  PATIENT:  Alesia Husky  78 y.o. female  PRE-OPERATIVE DIAGNOSIS:  LEFT RENAL PELVIS CANCER  POST-OPERATIVE DIAGNOSIS:  LEFT RENAL PELVIS CANCER  PROCEDURE:  Procedure(s): CYSTOSCOPY/DIAGNOSTIC URETEROSCOPY/ RETROGRADE/STENT EXCHANGE (Left) CYSTOSCOPY, WITH RETROGRADE PYELOGRAM (Left)  SURGEON:  Surgeons and Role:    * Manny, Harvey Linen., MD - Primary  PHYSICIAN ASSISTANT:   ASSISTANTS: none   ANESTHESIA:   general  EBL:  minimal   BLOOD ADMINISTERED:none  DRAINS: none   LOCAL MEDICATIONS USED:  NONE  SPECIMEN:  Source of Specimen:  old ureteral stent  DISPOSITION OF SPECIMEN:   discard  COUNTS:  YES  TOURNIQUET:  * No tourniquets in log *  DICTATION: .Other Dictation: Dictation Number [id number]  PLAN OF CARE: Discharge to home after PACU  PATIENT DISPOSITION:  PACU - hemodynamically stable.   Delay start of Pharmacological VTE agent (>24hrs) due to surgical blood loss or risk of bleeding: yes

## 2024-01-30 NOTE — Transfer of Care (Signed)
 Immediate Anesthesia Transfer of Care Note  Patient: [name]  Procedure(s) Performed: CYSTOSCOPY/DIAGNOSTIC URETEROSCOPY/ RETROGRADE/STENT EXCHANGE (Left) CYSTOSCOPY, WITH RETROGRADE PYELOGRAM (Left)  Patient Location: PACU  Anesthesia Type:General  Level of Consciousness: sedated  Airway & Oxygen Therapy: Patient Spontanous Breathing and Patient connected to face mask oxygen  Post-op Assessment: Report given to RN and Post -op Vital signs reviewed and stable  Post vital signs: Reviewed and stable  Last Vitals:  Vitals Value Taken Time  BP 155/73 01/30/24 0800  Temp    Pulse 69 01/30/24 0801  Resp 11 01/30/24 0801  SpO2 100 % 01/30/24 0801  Vitals shown include unfiled device data.  Last Pain:  Vitals:   01/30/24 0636  TempSrc:   PainSc: 0-No pain         Complications: No notable events documented.

## 2024-01-30 NOTE — Discharge Instructions (Signed)
 1 - You may have urinary urgency (bladder spasms) and bloody urine on / off with stent in place. This is normal.  2 - Call MD or go to ER for fever >102, severe pain / nausea / vomiting not relieved by medications, or acute change in medical status

## 2024-01-30 NOTE — Anesthesia Postprocedure Evaluation (Signed)
 Anesthesia Post Note  Patient: [name]  Procedure(s) Performed: CYSTOSCOPY/DIAGNOSTIC URETEROSCOPY/ RETROGRADE/STENT EXCHANGE (Left) CYSTOSCOPY, WITH RETROGRADE PYELOGRAM (Left)     Patient location during evaluation: PACU Anesthesia Type: General Level of consciousness: awake and alert Pain management: pain level controlled Vital Signs Assessment: post-procedure vital signs reviewed and stable Respiratory status: spontaneous breathing, nonlabored ventilation and respiratory function stable Cardiovascular status: stable and blood pressure returned to baseline Anesthetic complications: no  No notable events documented.  Last Vitals:  Vitals:   01/30/24 0845 01/30/24 0902  BP: (!) 143/74 113/69  Pulse: 62 73  Resp: 14 16  Temp: (!) 36.3 C (!) 36.3 C  SpO2: 97% 93%    Last Pain:  Vitals:   01/30/24 0902  TempSrc:   PainSc: 0-No pain                 Juventino Oppenheim

## 2024-01-30 NOTE — Anesthesia Procedure Notes (Signed)
 Procedure Name: LMA Insertion Date/Time: 01/30/2024 7:29 AM  Performed by: Micky Albee, CRNAPre-anesthesia Checklist: Patient identified, Emergency Drugs available, Suction available, Patient being monitored and Timeout performed Patient Re-evaluated:Patient Re-evaluated prior to induction Oxygen Delivery Method: Circle system utilized Preoxygenation: Pre-oxygenation with 100% oxygen Induction Type: IV induction LMA: LMA inserted LMA Size: 4.0 Tube type: Oral Number of attempts: 1 Placement Confirmation: positive ETCO2 and breath sounds checked- equal and bilateral Tube secured with: Tape Dental Injury: Teeth and Oropharynx as per pre-operative assessment

## 2024-01-30 NOTE — H&P (Signed)
 [name] is an 78 y.o. female.    Chief Complaint: Pre-Op LEFT ureteroscopy / tumor ablation / stent exchange  HPI:   1 - Multifocal Possibly Metastatic Urothelial Carcinoma - Long h/o high grade bladder and upper tract cancer since 2008 s/p right neph-U. Left high grade renal pelvis cancer since 2020. BCG induction 2021. Jelmyto  via nephrostomy 2022 and 2023 (two separate cycles)   Recnet Course:  11/2022 - Let 7x24 JJ stent exchange by Dahlsted / laser ablation few small ? lesions;  06/2023 CT - non-specific hilarl adenopathy (<2cm, prev PET negative), OR ureteroscopioc tumro ablation / stent exchange  12/2023 - CT chest/abd/pelvis - no over metastatic disease, stable/improved shoddy adenopathy.   2 - Stage 4 Renal Insuficiency / Solitary Kidney - Cr 2s, GFR 20s x many. Solitary left kidney.   3 - Bacteruria - non-clonal CX's x several.   PMH sig for CAD (med mgm't follows [facility]), benign hst. LIves independantly with husband. Her PCP is Denna Fish MD.   Today " [name] " is seen to proceed with LEFT endoscopy for urothelial cancer in solitary kidney. No inteval fevers. Most recent UCX negative. Cr 3 with normal K.   Past Medical History:  Diagnosis Date   Anemia associated with chronic renal failure    Arthritis    Blood dyscrasia 2008   hx of PE   CAD in native artery 10/2019   cardiologist-   dr Melven Stable. Paulita Boss;   a. 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   urologist--- dr dahlstedt/  oncologist--- dr Dirk Fredericks;   dx 09/ 2020 high grade urothelial  s/p laser ablation, ;  recurrent s/p BCG instillation,  completed chemo instilation 11/ 2022  and repeat chemo completed 05/ 2023   Chronic combined systolic and diastolic CHF (congestive heart failure) (HCC) 06/2019   followed by cardiology;   dx 09/ 2022 in setting of sepsis, pulm edema;   05/ 2022  ef 40-45%  per echo,  recovered per cath 01/ 2021 ef 50%;  laste echo 08/ 2023  ef 60-65%   CKD (chronic kidney disease), stage III (HCC)    Depression    Dysrhythmia    bradycardia   GAD (generalized anxiety disorder)    GERD (gastroesophageal reflux disease)    History of bladder cancer 12/2006   followed by dr Joie Narrow   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   dx 04/ 2008  w/ poor function hydronephrotic  right kidney;   02-06-2007  s/p right nephroureterectomy   History of kidney stones    History of pulmonary embolism 04/2022   in setting severe sepsis;  small RLL, treated w/ 3 months eliquis    Hyperlipidemia, mixed    Hypertension    Solitary kidney, acquired 02/06/2007   s/p  right nephroureterectomy for cancer    Past Surgical History:  Procedure Laterality Date   CATARACT EXTRACTION W/ INTRAOCULAR LENS IMPLANT Bilateral 2011   CYSTOSCOPY W/ URETERAL STENT PLACEMENT Left 11/17/2019   Procedure: CYSTOSCOPY WITH STENT REPLACEMENT;  Surgeon: Trent Frizzle, MD;  Location: [facility];  Service: Urology;  Laterality: Left;   CYSTOSCOPY W/ URETERAL STENT PLACEMENT Left 03/23/2022   Procedure: CYSTOSCOPY WITH RETROGRADE PYELOGRAM, LEFT URETERAL STENT PLACEMENT;  Surgeon: Trent Frizzle, MD;  Location: [facility];  Service: Urology;  Laterality: Left;   CYSTOSCOPY W/ URETERAL STENT PLACEMENT Left 06/09/2022  Procedure: CYSTOSCOPY WITH LEFT STENT EXCHANGE;  Surgeon: Trent Frizzle, MD;  Location: [facility];  Service: Urology;  Laterality: Left;   CYSTOSCOPY W/ URETERAL STENT PLACEMENT Left 12/04/2022   Procedure: CYSTOSCOPY WITH STENT REPLACEMENT;  Surgeon: Trent Frizzle, MD;  Location: [facility];  Service: Urology;  Laterality: Left;   CYSTOSCOPY W/ URETERAL STENT REMOVAL Left 05/10/2020   Procedure: CYSTOSCOPY WITH STENT REMOVAL;  Surgeon: Trent Frizzle, MD;  Location: [facility];  Service: Urology;  Laterality:  Left;   CYSTOSCOPY W/ URETERAL STENT REMOVAL Left 05/02/2021   Procedure: CYSTOSCOPY WITH STENT REMOVAL;  Surgeon: Trent Frizzle, MD;  Location[facility];  Service: Urology;  Laterality: Left;   CYSTOSCOPY W/ URETERAL STENT REMOVAL Left 11/16/2022   Procedure: CYSTOSCOPY WITH STENT REMOVAL;  Surgeon: Trent Frizzle, MD;  Location: [facility];  Service: Urology;  Laterality: Left;   CYSTOSCOPY WITH RETROGRADE PYELOGRAM, URETEROSCOPY AND STENT PLACEMENT Left 06/27/2019   Procedure: CYSTOSCOPY WITH LEFT RETROGRADE PYELOGRAM, URETEROSCOPY, BIOPSY AND LEFT STENT PLACEMENT;  Surgeon: Homero Luster, MD;  Location: [facility];  Service: Urology;  Laterality: Left;   CYSTOSCOPY WITH RETROGRADE PYELOGRAM, URETEROSCOPY AND STENT PLACEMENT Left 08/18/2019   Procedure: CYSTOSCOPY, URETEROSCOPY AND STENT EXCHANGE;  Surgeon: Trent Frizzle, MD;  Location: [facility];  Service: Urology;  Laterality: Left;  90 MINS   CYSTOSCOPY WITH RETROGRADE PYELOGRAM, URETEROSCOPY AND STENT PLACEMENT Left 11/17/2019   Procedure: CYSTOSCOPY WITH RETROGRADE PYELOGRAM, URETEROSCOPY AND STENT PLACEMENT;  Surgeon: Trent Frizzle, MD;  Location: [facility];  Service: Urology;  Laterality: Left;  90 MINS   CYSTOSCOPY WITH RETROGRADE PYELOGRAM, URETEROSCOPY AND STENT PLACEMENT Left 05/10/2020   Procedure: CYSTOSCOPY WITH RETROGRADE PYELOGRAM, URETEROSCOPY AND STENT PLACEMENT WITH URETHRAL DIALATION AND BRUSH BIOPSY;  Surgeon: Trent Frizzle, MD;  Location: [facility];  Service: Urology;  Laterality: Left;  1 HR   CYSTOSCOPY WITH RETROGRADE PYELOGRAM, URETEROSCOPY AND STENT PLACEMENT Left 09/16/2020   Procedure: CYSTOSCOPY WITH RETROGRADE PYELOGRAM, URETEROSCOPY ,  LITHOPAXY, LEFT URETERAL BRUSHING, AND STENT REPLACEMENT;  Surgeon: Trent Frizzle, MD;  Location: [facility];  Service: Urology;  Laterality: Left;   CYSTOSCOPY WITH RETROGRADE  PYELOGRAM, URETEROSCOPY AND STENT PLACEMENT Left 10/24/2021   Procedure: CYSTOSCOPY WITH RETROGRADE PYELOGRAM, URETEROSCOPY, POSSIBLE URETERAL AND RENAL BIOPSIES AND STENT PLACEMENT;  Surgeon: Trent Frizzle, MD;  Location: [facility];  Service: Urology;  Laterality: Left;  1 HR   CYSTOSCOPY WITH RETROGRADE PYELOGRAM, URETEROSCOPY AND STENT PLACEMENT Left 03/09/2022   Procedure: CYSTOSCOPY WITH ANTEGRADE PYELOGRAM,  STENT REMOVAL;  Surgeon: Trent Frizzle, MD;  Location: [facility];  Service: Urology;  Laterality: Left;   CYSTOSCOPY WITH RETROGRADE PYELOGRAM, URETEROSCOPY AND STENT PLACEMENT  01/09/2007   @[facility]  by dr Joie Narrow;    bx's/ washing  bladder , right ureter   CYSTOSCOPY WITH RETROGRADE PYELOGRAM, URETEROSCOPY AND STENT PLACEMENT Left 11/16/2022   Procedure: CYSTOSCOPY WITH RETROGRADE PYELOGRAM, URETEROSCOPY AND STENT REPLACEMENT;  Surgeon: Trent Frizzle, MD;  Location: [facility];  Service: Urology;  Laterality: Left;  90 MINS   CYSTOSCOPY WITH RETROGRADE PYELOGRAM, URETEROSCOPY AND STENT PLACEMENT Left 07/06/2023   Procedure: CYSTOSCOPY WITH LEFT RETROGRADE PYELOGRAM, URETEROSCOPY, LASER ABLATION OF TUMOR AND STENT EXCHANGE;  Surgeon: Melody Spurling., MD;  Location: [facility];  Service: Urology;  Laterality: Left;  60 MINUTES NEEDED FOR CASE   CYSTOSCOPY/URETEROSCOPY/HOLMIUM LASER Left 12/04/2022   Procedure: CYSTOSCOPY/URETEROSCOPY/  HOLMIUM LASER OF RENAL PELVIC LESIONS;  Surgeon:  Trent Frizzle, MD;  Location: [facility];  Service: Urology;  Laterality: Left;   CYSTOSCOPY/URETEROSCOPY/HOLMIUM LASER/STENT PLACEMENT Left 05/02/2021   Procedure: CYSTOSCOPY/URETEROSCOPY WITH BRUSH BIOPSY/ RETROGRADE PYELOGRAM/ HOLMIUM LASER/STENT REPLACEMENT;  Surgeon: Trent Frizzle, MD;  Location: [facility];  Service: Urology;  Laterality: Left;   HOLMIUM LASER APPLICATION Left 09/16/2020   Procedure:  HOLMIUM LASER APPLICATION;  Surgeon: Trent Frizzle, MD;  Location: [facility];  Service: Urology;  Laterality: Left;   HOLMIUM LASER APPLICATION Left 10/24/2021   Procedure: HOLMIUM LASER APPLICATION OF TUMORS;  Surgeon: Trent Frizzle, MD;  Location: [facility];  Service: Urology;  Laterality: Left;   HOLMIUM LASER APPLICATION Left 11/16/2022   Procedure: HOLMIUM LASER OF TUMORS;  Surgeon: Trent Frizzle, MD;  Location: [facility];  Service: Urology;  Laterality: Left;   IR NEPHROSTOMY PLACEMENT LEFT  07/06/2021   IR NEPHROSTOMY PLACEMENT LEFT  02/06/2022   NEPHROSTOMY TUBE REMOVAL Left 03/23/2022   Procedure: NEPHROSTOMY TUBE REMOVAL;  Surgeon: Trent Frizzle, MD;  Location: [facility];  Service: Urology;  Laterality: Left;   NEPHROURETERECTOMY Right 02/06/2007   @[facility]  by dr Joie Narrow;   Laparoscopic   RIGHT/LEFT HEART CATH AND CORONARY ANGIOGRAPHY N/A 11/06/2019   Procedure: RIGHT/LEFT HEART CATH AND CORONARY ANGIOGRAPHY;  Surgeon: Arty Binning, MD;  Location: [facility];  Service: Cardiovascular;  Laterality: N/A;   THULIUM LASER TURP (TRANSURETHRAL RESECTION OF PROSTATE) Left 08/18/2019   Procedure: THULIUM LASER ABLATION OF URETERAL TUMOR;  Surgeon: Trent Frizzle, MD;  Location: [facility];  Service: Urology;  Laterality: Left;   THULIUM LASER TURP (TRANSURETHRAL RESECTION OF PROSTATE) Left 11/17/2019   Procedure: THULIUM LASER of URETERAL CANCER;  Surgeon: Trent Frizzle, MD;  Location: [facility];  Service: Urology;  Laterality: Left;   TRANSURETHRAL RESECTION OF BLADDER TUMOR  12/12/2006   @[facility]  by dr Milon Aloe   VAGINAL HYSTERECTOMY  1980    Family History  Problem Relation Age of Onset   Hypertension Mother    Diabetes Mellitus II Sister    Hypertension Sister    Hypertension Brother    Social History:  reports that she quit smoking about 20 years ago. Her smoking use  included cigarettes. She started smoking about 58 years ago. She has a 19 pack-year smoking history. She has never used smokeless tobacco. She reports that she does not currently use alcohol. She reports that she does not use drugs.  Allergies:  Allergies  Allergen Reactions   Erythromycin Diarrhea and Nausea And Vomiting   Lotensin [Benazepril Hcl] Cough   Macrodantin [Nitrofurantoin] Diarrhea    And night sweats    Medications Prior to Admission  Medication Sig Dispense Refill   acetaminophen  (TYLENOL ) 500 MG tablet Take 500-1,000 mg by mouth every 6 (six) hours as needed for mild pain.     aspirin  EC 81 MG tablet Take 1 tablet (81 mg total) by mouth daily. Swallow whole. 90 tablet 3   calcium  carbonate (TUMS EX) 750 MG chewable tablet Chew 1 tablet by mouth every morning.     carvedilol  (COREG ) 6.25 MG tablet TAKE 1 TABLET(6.25 MG) BY MOUTH TWICE DAILY WITH A MEAL (Patient taking differently: Take 3.125 mg by mouth in the morning and at bedtime.) 180 tablet 3   cholecalciferol  (VITAMIN D3) 25 MCG (1000 UNIT) tablet Take 1,000 Units by mouth every morning.     Ferrous Sulfate (IRON PO) Take 1 tablet by mouth in the morning.  icosapent  Ethyl (VASCEPA ) 1 g capsule Take 1 capsule (1 g total) by mouth 2 (two) times daily. 60 capsule 0   isosorbide  mononitrate (IMDUR ) 30 MG 24 hr tablet TAKE 1/2 TABLET(15 MG) BY MOUTH DAILY 45 tablet 3   rosuvastatin  (CRESTOR ) 40 MG tablet TAKE 1 TABLET(40 MG) BY MOUTH DAILY 90 tablet 3   sertraline  (ZOLOFT ) 50 MG tablet Take 50 mg by mouth every morning.     traZODone  (DESYREL ) 150 MG tablet Take 150 mg by mouth at bedtime.      No results found for this or any previous visit (from the past 48 hours). No results found.  Review of Systems  Constitutional:  Negative for chills and fever.  Genitourinary:  Positive for urgency.  All other systems reviewed and are negative.   Blood pressure (!) 144/75, pulse (!) 50, temperature 98 F (36.7 C),  temperature source Oral, resp. rate 16, SpO2 92%. Physical Exam Vitals reviewed.  HENT:     Head: Normocephalic.     Nose: Nose normal.  Eyes:     Pupils: Pupils are equal, round, and reactive to light.  Cardiovascular:     Rate and Rhythm: Normal rate.  Abdominal:     General: Abdomen is flat.     Comments: Prior scars w/o hernias.   Genitourinary:    Comments: No CVAT at present Musculoskeletal:        General: Normal range of motion.     Cervical back: Normal range of motion.  Skin:    General: Skin is warm.  Neurological:     General: No focal deficit present.     Mental Status: She is alert.  Psychiatric:        Mood and Affect: Mood normal.      Assessment/Plan  Proceed as planed with cysto, left retrograde, ureteroscopy / tumor ablation / stent exchange for palliation of multifocal cancer. Risks, benefits, alternatives, expectec peri-op course discussed prevoiusly and reiterated today.    Melody Spurling., MD 01/30/2024, 6:28 AM

## 2024-01-31 ENCOUNTER — Encounter (HOSPITAL_COMMUNITY): Payer: Self-pay | Admitting: Urology

## 2024-02-04 ENCOUNTER — Ambulatory Visit: Attending: Internal Medicine | Admitting: Internal Medicine

## 2024-02-04 VITALS — BP 122/73 | HR 64 | Ht 63.0 in | Wt 175.0 lb

## 2024-02-04 DIAGNOSIS — I10 Essential (primary) hypertension: Secondary | ICD-10-CM | POA: Diagnosis not present

## 2024-02-04 DIAGNOSIS — I5042 Chronic combined systolic (congestive) and diastolic (congestive) heart failure: Secondary | ICD-10-CM | POA: Diagnosis not present

## 2024-02-04 DIAGNOSIS — I2510 Atherosclerotic heart disease of native coronary artery without angina pectoris: Secondary | ICD-10-CM

## 2024-02-04 DIAGNOSIS — E782 Mixed hyperlipidemia: Secondary | ICD-10-CM

## 2024-02-04 MED ORDER — ICOSAPENT ETHYL 1 G PO CAPS: 1.0000 g | ORAL_CAPSULE | Freq: Two times a day (BID) | ORAL | 3 refills | Status: DC

## 2024-02-04 MED ORDER — CARVEDILOL 6.25 MG PO TABS
ORAL_TABLET | ORAL | 3 refills | Status: AC
Start: 2024-02-04 — End: ?

## 2024-02-04 NOTE — Patient Instructions (Addendum)
 Medication Instructions:  Your physician recommends that you continue on your current medications as directed. Please refer to the Current Medication list given to you today. REFILLED: Vascepa  and Carvedilol    *If you need a refill on your cardiac medications before your next appointment, please call your pharmacy*   Lab Work: JULY- - Fasting lipid panel and ALT at any [employer] [employer]  If you have labs (blood work) drawn today and your tests are completely normal, you will receive your results only by: [employer] (if you have MyChart) OR A paper copy in the mail If you have any lab test that is abnormal or we need to change your treatment, we will call you to review the results.   Testing/Procedures: NONE   Follow-Up: At [facility], you and your health needs are our priority.  As part of our continuing mission to provide you with exceptional heart care, we have created designated Provider Care Teams.  These Care Teams include your primary Cardiologist (physician) and Advanced Practice Providers (APPs -  Physician Assistants and Nurse Practitioners) who all work together to provide you with the care you need, when you need it.  We recommend signing up for the patient portal called "MyChart".  Sign up information is provided on this After Visit Summary.  MyChart is used to connect with patients for Virtual Visits (Telemedicine).  Patients are able to view lab/test results, encounter notes, upcoming appointments, etc.  Non-urgent messages can be sent to your provider as well.   To learn more about what you can do with MyChart, go to [email or web address].    Your next appointment:   1 year(s)  Provider:   Gloriann Larger, MD

## 2024-02-04 NOTE — Progress Notes (Signed)
 Cardiology Office Note:  .    Date:  02/04/2024  ID:  [name], DOB 01/21/1946, MRN [id number] PCP: Ransom Byers, MD  [facility] Providers Cardiologist:  Jann Melody, MD     CC: CAD f/u   History of Present Illness: .    [name] is a 78 y.o. female with coronary artery disease, heart failure, and hyperlipidemia who presents for follow-up after a recent urologic procedure.  She has a history of coronary artery disease managed medically, heart failure with reduced ejection fraction, hypertension, and hyperlipidemia. Her heart function improved after starting medication, but medications were reduced due to low blood pressure. She has been asymptomatic for coronary disease since 2022 with no current chest pain, palpitations, or breathing issues.  She underwent a recent urologic procedure and is doing well post-operatively. There is no recurrence of her previous urologic carcinoma, and she confirms no new cancer diagnosis.  She acknowledges the need to increase physical activity, stating 'I need to start walking.'  No chest pain, palpitations, syncope, or dyspnea. Her kidney function has improved, and she reports no further procedures planned until October.  Discussed the use of AI scribe software for clinical note transcription with the patient, who gave verbal consent to proceed.   Relevant histories: .  Social- hx of urologic cancer ROS: As per HPI.   Studies Reviewed: .   Cardiac Studies & Procedures   ______________________________________________________________________________________________ CARDIAC CATHETERIZATION  CARDIAC CATHETERIZATION 11/06/2019  Conclusion  Normal right heart pressures.  The left main is short, and widely patent.  Ostial to proximal LAD diffuse 40 to 50% narrowing. A moderate to large diagonal #1 contains 40 to 50% proximal segmental narrowing, mid 75% narrowing after a bifurcation, and more distal  60% narrowing.  The circumflex gives 3 obtuse marginal branches. The first moderate-sized obtuse marginal contains ostial eccentric 50 to 60% narrowing.  The RCA contains eccentric 30 to 40% mid narrowing. No significant obstruction is noted otherwise.  Normal left ventricular systolic function. EF greater than 50%. LVEDP is normal.  RECOMMENDATIONS:   The patient has moderate coronary disease, especially involving the first diagonal. The diagonal is relatively diffusely diseased but could be treated with PCI although it perhaps a slightly increased risk of complications due to the diffuse nature of disease. There is no disease that would account for prolonged pain chest pain at rest. If symptoms become nitroglycerin responsive consider diagonal PCI if refractory and impacting quality of life.  Aggressive risk factor modification  Findings Coronary Findings Diagnostic  Dominance: Right  Left Anterior Descending Prox LAD to Mid LAD lesion is 50% stenosed.  First Diagonal Branch 1st Diag-1 lesion is 40% stenosed. 1st Diag-2 lesion is 75% stenosed. 1st Diag-3 lesion is 50% stenosed.  Left Circumflex  First Obtuse Marginal Branch 1st Mrg lesion is 50% stenosed.  Right Coronary Artery Prox RCA lesion is 30% stenosed.  Intervention  No interventions have been documented.   STRESS TESTS  NM MYOCAR MULTI W/SPECT W 06/30/2019  Narrative  There was no ST segment deviation noted during stress.  No T wave inversion was noted during stress.  Defect 1: There is a medium defect of mild severity present in the mid anteroseptal, mid inferolateral, apical anterior, apical septal and apical lateral location.  This is a low risk study.  Nuclear stress EF: 63%.  The left ventricular ejection fraction is normal (55-65%).  No prior study for comparison.  There is a mild to moderate defect extending  from mid anteroseptum to inferolateral wall. It improves with stress, which is not  consistent with ischemia. Normal wall motion in this area. Not consistent with single coronary distrubution. However, TID is 1.21, which means balanced ischemia in multiple territories. cannot be excluded   ECHOCARDIOGRAM  ECHOCARDIOGRAM COMPLETE 06/01/2022  Narrative ECHOCARDIOGRAM REPORT    Patient Name:   [name] Date of Exam: 06/01/2022 Medical Rec #:  [id number]         Height:       63.0 in Accession #:    [id number]        Weight:       164.0 lb Date of Birth:  [birth year]         BSA:          1.777 m Patient Age:    75 years          BP:           142/64 mmHg Patient Gender: F                 HR:           75 bpm. Exam Location:  Inpatient  Procedure: 2D Echo, 3D Echo, Cardiac Doppler and Color Doppler  Indications:     R94.31 Abnormal EKG  History:         Patient has prior history of Echocardiogram examinations, most recent 02/16/2021. CAD; Risk Factors:Hypertension and Dyslipidemia. Cancer. Pulmonary embolus.  Sonographer:     Raynelle Callow RDCS Referring Phys:  [id number] [facility] Diagnosing Phys: Riccardo Chamberlain MD   Sonographer Comments: Image acquisition challenging due to patient body habitus. IMPRESSIONS   1. Left ventricular ejection fraction, by estimation, is 60 to 65%. Left ventricular ejection fraction by 3D volume is 63 %. The left ventricle has normal function. The left ventricle has no regional wall motion abnormalities. Left ventricular diastolic parameters are consistent with Grade I diastolic dysfunction (impaired relaxation). 2. Right ventricular systolic function is normal. The right ventricular size is normal. There is normal pulmonary artery systolic pressure. The estimated right ventricular systolic pressure is 35.5 mmHg. 3. The mitral valve is grossly normal. Mild mitral valve regurgitation. 4. The aortic valve is tricuspid. There is mild calcification of the aortic valve. There is mild thickening of the aortic valve. Aortic valve  regurgitation is not visualized. Aortic valve sclerosis/calcification is present, without any evidence of aortic stenosis. 5. There is Severe (Grade IV) plaque involving the ascending aorta. 6. The inferior vena cava is normal in size with <50% respiratory variability, suggesting right atrial pressure of 8 mmHg.  Comparison(s): Compared to prior TTE on 02/2021, there is no significant change.  FINDINGS Left Ventricle: Left ventricular ejection fraction, by estimation, is 60 to 65%. Left ventricular ejection fraction by 3D volume is 63 %. The left ventricle has normal function. The left ventricle has no regional wall motion abnormalities. The left ventricular internal cavity size was normal in size. There is no left ventricular hypertrophy. Left ventricular diastolic parameters are consistent with Grade I diastolic dysfunction (impaired relaxation).  Right Ventricle: The right ventricular size is normal. No increase in right ventricular wall thickness. Right ventricular systolic function is normal. There is normal pulmonary artery systolic pressure. The tricuspid regurgitant velocity is 2.62 m/s, and with an assumed right atrial pressure of 8 mmHg, the estimated right ventricular systolic pressure is 35.5 mmHg.  Left Atrium: Left atrial size was normal in size.  Right Atrium: Right atrial size was normal in  size.  Pericardium: There is no evidence of pericardial effusion.  Mitral Valve: The mitral valve is grossly normal. There is mild thickening of the mitral valve leaflet(s). There is mild calcification of the mitral valve leaflet(s). Mild mitral annular calcification. Mild mitral valve regurgitation. MV peak gradient, 11.4 mmHg. The mean mitral valve gradient is 4.0 mmHg.  Tricuspid Valve: The tricuspid valve is normal in structure. Tricuspid valve regurgitation is mild.  Aortic Valve: The aortic valve is tricuspid. There is mild calcification of the aortic valve. There is mild thickening  of the aortic valve. Aortic valve regurgitation is not visualized. Aortic valve sclerosis/calcification is present, without any evidence of aortic stenosis. Aortic valve mean gradient measures 7.5 mmHg. Aortic valve peak gradient measures 13.6 mmHg. Aortic valve area, by VTI measures 1.79 cm.  Pulmonic Valve: The pulmonic valve was normal in structure. Pulmonic valve regurgitation is trivial.  Aorta: The aortic root and ascending aorta are structurally normal, with no evidence of dilitation. There is severe (Grade IV) plaque involving the ascending aorta.  Venous: The inferior vena cava is normal in size with less than 50% respiratory variability, suggesting right atrial pressure of 8 mmHg.  IAS/Shunts: The atrial septum is grossly normal.   LEFT VENTRICLE PLAX 2D LVIDd:         4.50 cm         Diastology LVIDs:         2.90 cm         LV e' medial:    8.92 cm/s LV PW:         1.20 cm         LV E/e' medial:  14.9 LV IVS:        0.90 cm         LV e' lateral:   7.40 cm/s LVOT diam:     1.90 cm         LV E/e' lateral: 18.0 LV SV:         75 LV SV Index:   42 LVOT Area:     2.84 cm        3D Volume EF LV 3D EF:    Left ventricul LV Volumes (MOD)                            ar LV vol d, MOD    55.7 ml                    ejection A2C:                                        fraction LV vol d, MOD    60.9 ml                    by 3D A4C:                                        volume is LV vol s, MOD    21.5 ml                    63 %. A2C: LV vol s, MOD    20.8 ml A4C:  3D Volume EF: LV SV MOD A2C:   34.2 ml       3D EF:        63 % LV SV MOD A4C:   60.9 ml       LV EDV:       93 ml LV SV MOD BP:    37.1 ml       LV ESV:       34 ml LV SV:        58 ml  RIGHT VENTRICLE             IVC RV S prime:     13.30 cm/s  IVC diam: 1.90 cm TAPSE (M-mode): 2.4 cm  LEFT ATRIUM             Index        RIGHT ATRIUM           Index LA diam:        3.60 cm 2.03 cm/m    RA Area:     10.60 cm LA Vol (A2C):   58.7 ml 33.03 ml/m  RA Volume:   20.90 ml  11.76 ml/m LA Vol (A4C):   38.5 ml 21.66 ml/m LA Biplane Vol: 48.6 ml 27.35 ml/m AORTIC VALVE                     PULMONIC VALVE AV Area (Vmax):    1.89 cm      PR End Diast Vel: 2.26 msec AV Area (Vmean):   1.89 cm AV Area (VTI):     1.79 cm AV Vmax:           184.50 cm/s AV Vmean:          126.500 cm/s AV VTI:            0.422 m AV Peak Grad:      13.6 mmHg AV Mean Grad:      7.5 mmHg LVOT Vmax:         123.00 cm/s LVOT Vmean:        84.500 cm/s LVOT VTI:          0.266 m LVOT/AV VTI ratio: 0.63  AORTA Ao Root diam: 2.90 cm Ao Asc diam:  2.90 cm  MITRAL VALVE                TRICUSPID VALVE MV Area (PHT): 3.99 cm     TR Peak grad:   27.5 mmHg MV Area VTI:   1.72 cm     TR Vmax:        262.00 cm/s MV Peak grad:  11.4 mmHg MV Mean grad:  4.0 mmHg     SHUNTS MV Vmax:       1.69 m/s     Systemic VTI:  0.27 m MV Vmean:      94.4 cm/s    Systemic Diam: 1.90 cm MV Decel Time: 190 msec MV E velocity: 133.00 cm/s MV A velocity: 153.50 cm/s MV E/A ratio:  0.87  Riccardo Chamberlain MD Electronically signed by Riccardo Chamberlain MD Signature Date/Time: 06/01/2022/4:34:28 PM    Final (Updated)          ______________________________________________________________________________________________        Physical Exam:    VS:  BP 122/73 (BP Location: Left Arm)   Pulse 64   Ht 5\' 3"  (1.6 m)   Wt 79.4 kg   SpO2 94%   BMI 31.00 kg/m    Wt Readings  from Last 3 Encounters:  02/04/24 79.4 kg  01/30/24 78 kg  01/22/24 78.5 kg    Gen: no distress  Neck: No JVD Cardiac: No Rubs or Gallops, no murmur, RRR +2 radial pulses Respiratory: Clear to auscultation bilaterally, normal effort, normal  respiratory rate GI: Soft, nontender, non-distended  MS: No  edema;  moves all extremities Integument: Skin feels warm Neuro:  At time of evaluation, alert and oriented to  person/place/time/situation  Psych: Normal affect, patient feels ok   ASSESSMENT AND PLAN: .    Coronary artery disease Asymptomatic coronary artery disease with improved heart function through medical management. No current chest pain or other symptoms - Encourage physical activity such as walking or biking. - Lipids this summer - refill Vascepa , continue coreg  and current medications  Heart failure with reduced ejection fraction Improved heart failure with reduced ejection fraction. No symptoms such as shortness of breath or edema. Medications adjusted previously due to hypotension. - Continue current heart medications (recovery of LVEF on therapy)  Hypertension Well-controlled hypertension with no recent issues reported.  Hyperlipidemia Slightly elevated hyperlipidemia last year. Ongoing cholesterol management with encouragement to increase physical activity. - Refill cholesterol medication. - Check cholesterol levels in July. - Encourage physical activity to aid in cholesterol management.  Urothelial carcinoma No recurrence of urothelial carcinoma following recent urologic procedure, which was not due to cancer recurrence. - If no clinical changes, ok for urologic procedure in October of 2025  Gloriann Larger, MD FASE [facility] Cardiologist [facility]  [address] [place], [address] [place], [place] [address] [phone]  3:41 PM

## 2024-02-18 DIAGNOSIS — R197 Diarrhea, unspecified: Secondary | ICD-10-CM | POA: Diagnosis not present

## 2024-02-21 DIAGNOSIS — R197 Diarrhea, unspecified: Secondary | ICD-10-CM | POA: Diagnosis not present

## 2024-02-22 IMAGING — CT CT ABD-PELV W/O CM
2 of 4 series · 16 of 46 positions shown, 18 images · non-contrast
Comparison: 06/07/2021

CLINICAL DATA: Abdominal pain. Patient receiving chemotherapy for
urothelial carcinoma via indwelling left nephrostomy. Catheter leak.



[Series 2: axial st · axial · 0.90mm/px · z∈[-454,-34]mm · 13 of 98 slices shown, 15 images]
[im 7/98  soft-tissue]
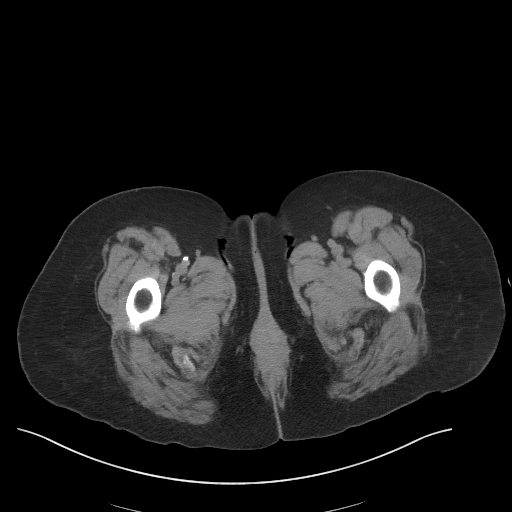
[im 7/98  bone]
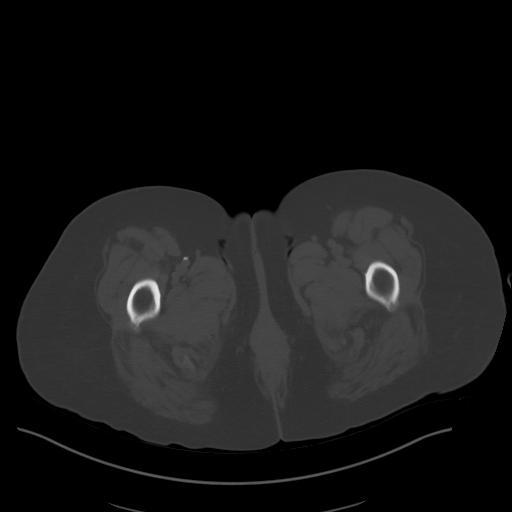
[im 13/98  soft-tissue]
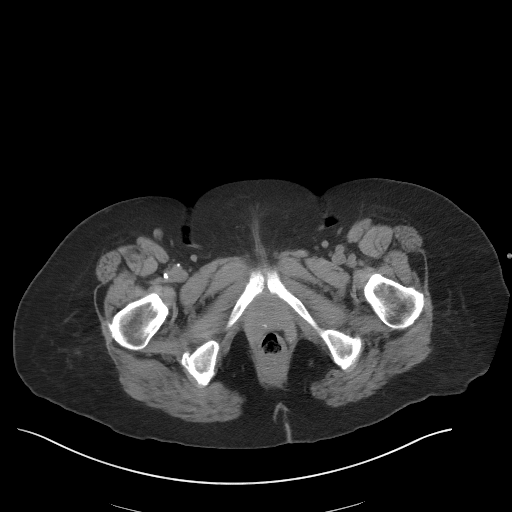
[im 19/98  soft-tissue]
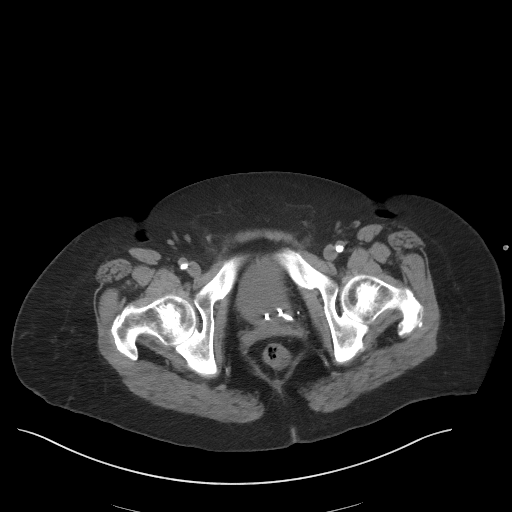
[im 31/98  soft-tissue]
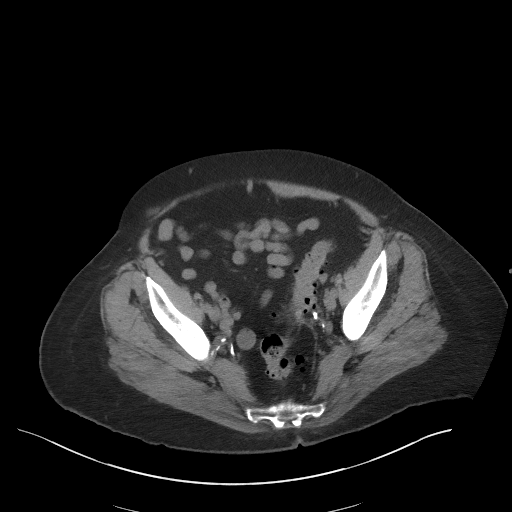
[im 37/98  soft-tissue]
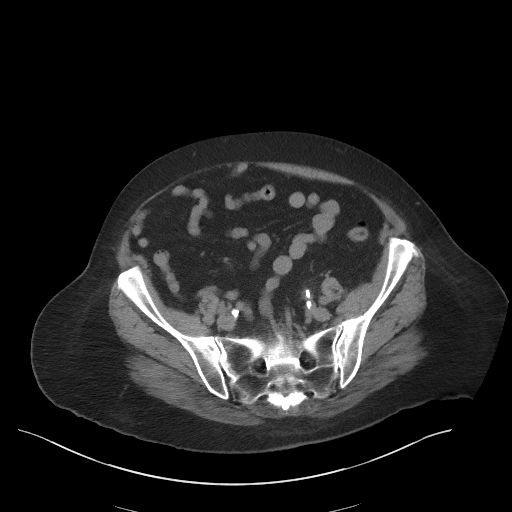
[im 43/98  soft-tissue]
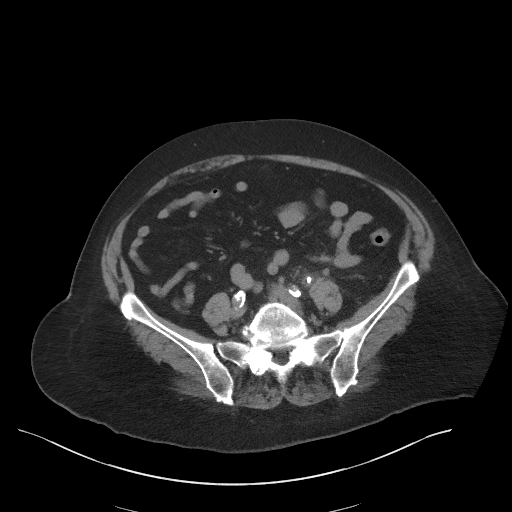
[im 49/98  soft-tissue]
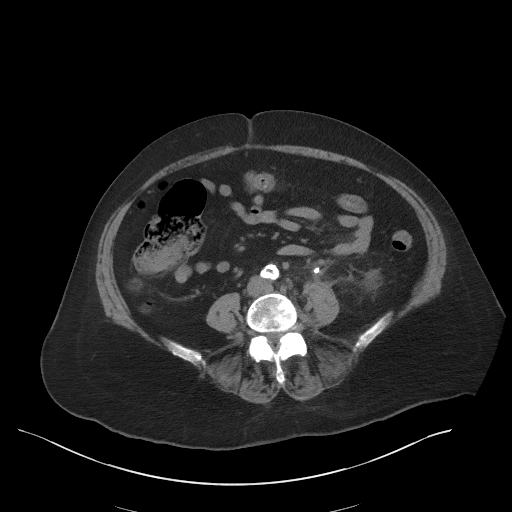
[im 55/98  soft-tissue]
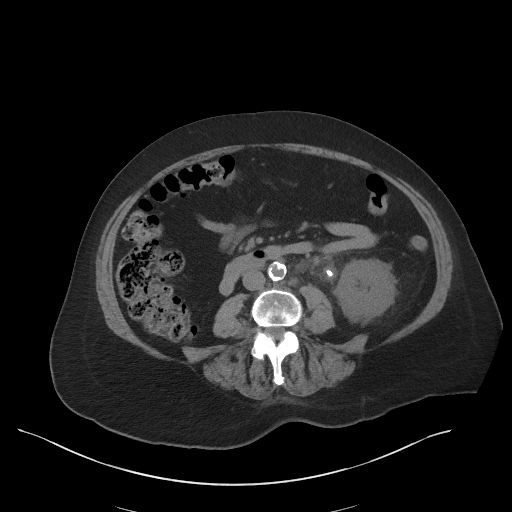
[im 61/98  soft-tissue]
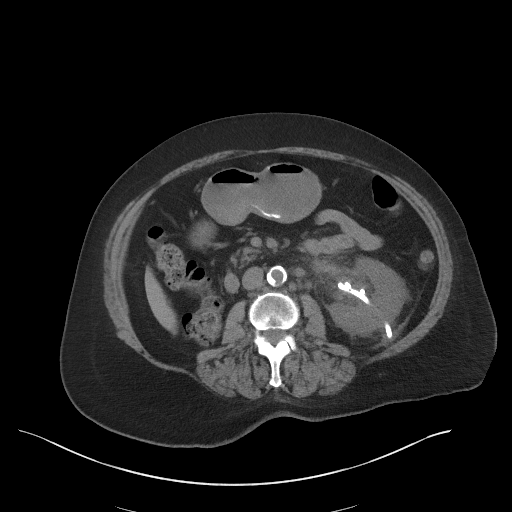
[im 61/98  bone]
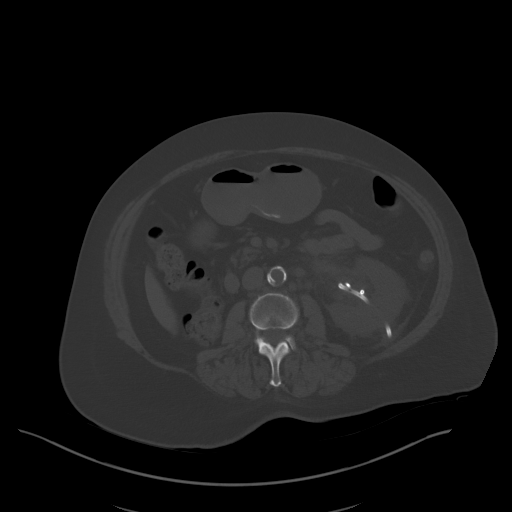
[im 67/98  soft-tissue]
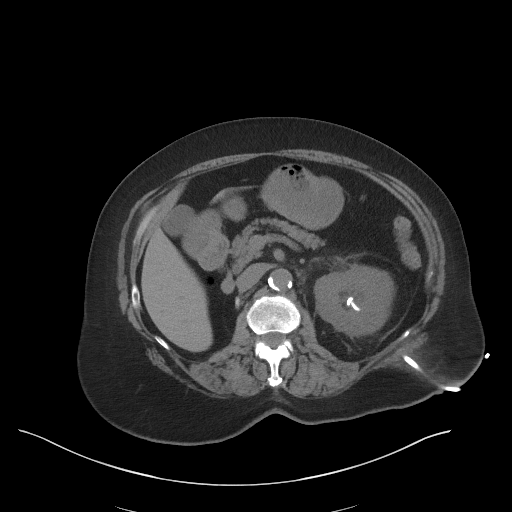
[im 79/98  soft-tissue]
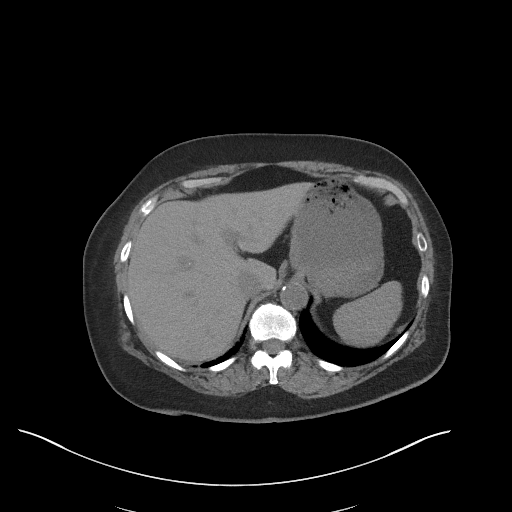
[im 85/98  soft-tissue]
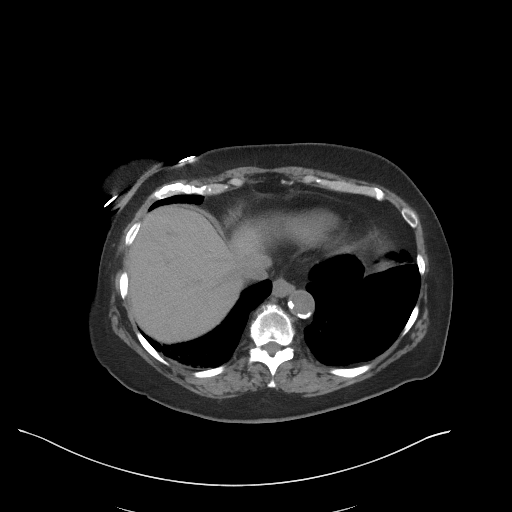
[im 91/98  soft-tissue]
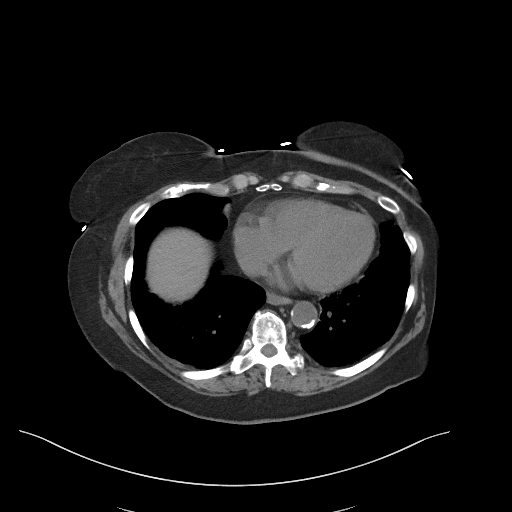

[Series 4: coronal st · coronal · 0.87mm/px · 3 of 100 slices shown]
[im 34/100  soft-tissue]
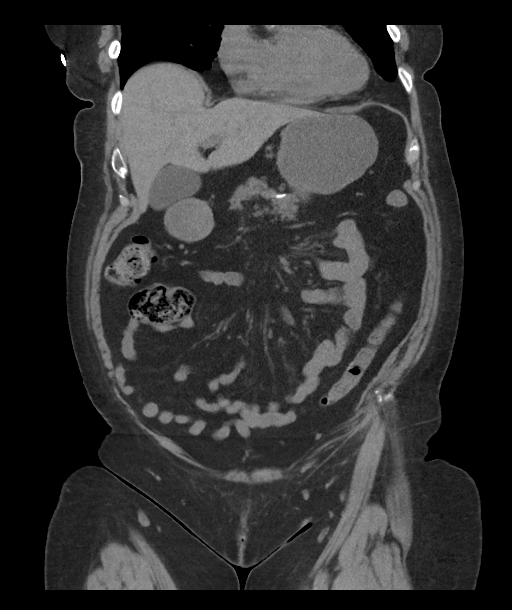
[im 45/100  soft-tissue]
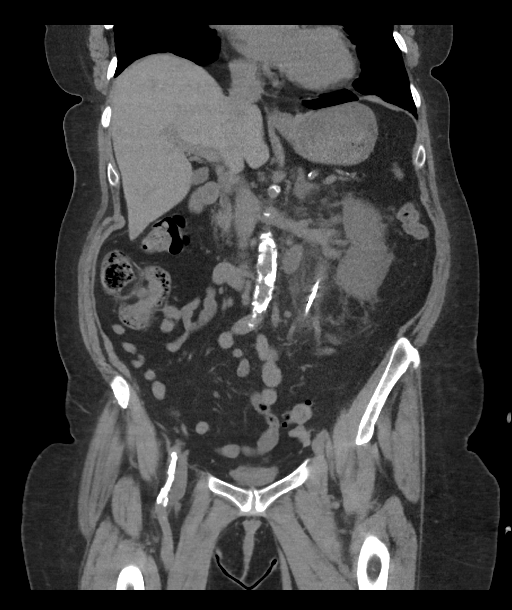
[im 56/100  soft-tissue]
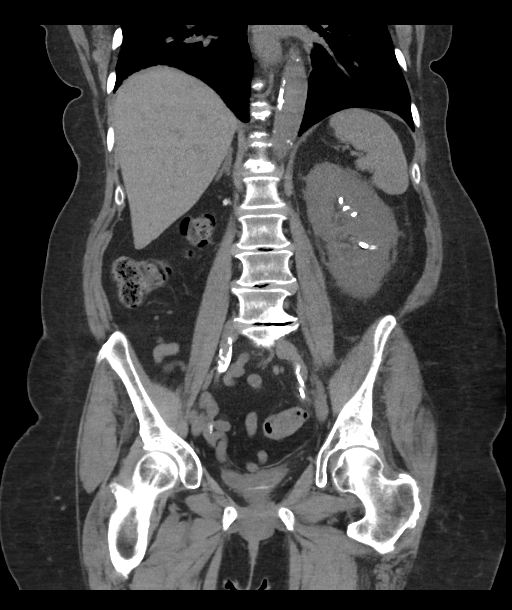

[16 of 46 positions shown; findings below may reference images not displayed]

FINDINGS: Limited evaluation due to lack of intravenous contrast
administration.

Lower chest: Mild subpleural scarring at the lung bases.

Hepatobiliary: Unenhanced liver is unremarkable.

Gallbladder is unremarkable. No intrahepatic or extrahepatic ductal
dilatation.

Pancreas: Within normal limits.

Spleen: Within normal limits.

Adrenals/Urinary Tract: Adrenal glands are within normal limits.

Status post right nephrectomy.

Left perirenal edema. Indwelling nephroureterostomy catheter in
satisfactory position. Mild perinephric and periureteral stranding.
No hydronephrosis.

Bladder is underdistended with asymmetric left posterolateral
bladder wall thickening (series 2/image 79), poorly evaluated.
Distal ureteral catheter in satisfactory position.

Stomach/Bowel: Stomach is within normal limits.

No evidence of bowel obstruction.

Normal appendix (series 2/image 55).

Mild sigmoid diverticulosis, without evidence of diverticulitis.

Vascular/Lymphatic: No evidence of abdominal aortic aneurysm.

Atherosclerotic calcifications of the abdominal aorta and branch
vessels.

Clustered left para-aortic nodes measuring up to 10 mm short axis
(series 2/image 42), new/progressive, suspicious.

Reproductive: Status post hysterectomy.

Bilateral ovaries are within normal limits.

Other: No abdominopelvic ascites.

Musculoskeletal: Mild degenerative changes of the visualized
thoracolumbar spine.
IMPRESSION: Limited evaluation due to lack of intravenous contrast
administration.

Left perirenal edema. Mild perinephric and periureteral stranding.
Indwelling left nephroureterostomy catheter in satisfactory
position. No hydronephrosis.

Clustered left para-aortic nodes measuring up to 10 mm short axis,
new/progressive, suspicious for nodal metastases.

Asymmetric left posterolateral bladder wall thickening, poorly
evaluated.

Status post right nephrectomy.

## 2024-02-25 DIAGNOSIS — C689 Malignant neoplasm of urinary organ, unspecified: Secondary | ICD-10-CM | POA: Diagnosis not present

## 2024-02-25 DIAGNOSIS — N184 Chronic kidney disease, stage 4 (severe): Secondary | ICD-10-CM | POA: Diagnosis not present

## 2024-02-25 DIAGNOSIS — N2581 Secondary hyperparathyroidism of renal origin: Secondary | ICD-10-CM | POA: Diagnosis not present

## 2024-02-25 DIAGNOSIS — I129 Hypertensive chronic kidney disease with stage 1 through stage 4 chronic kidney disease, or unspecified chronic kidney disease: Secondary | ICD-10-CM | POA: Diagnosis not present

## 2024-02-25 DIAGNOSIS — D631 Anemia in chronic kidney disease: Secondary | ICD-10-CM | POA: Diagnosis not present

## 2024-02-25 DIAGNOSIS — N179 Acute kidney failure, unspecified: Secondary | ICD-10-CM | POA: Diagnosis not present

## 2024-03-02 IMAGING — CT CT ABD-PELV W/O CM
2 of 4 series · 15 of 46 positions shown, 17 images · non-contrast
Comparison: None Available.

CLINICAL DATA: Nephrostomy catheter displacement left flank pain
with nephrostomy tube. Pt presents with c/o bilateral feet swelling.
Pt recently had surgery on her kidney on [REDACTED] and reports the
swelling has been present since then. Pt has a left nephrostomy. Pt
reports that urine is not draining as well.



[Series 2: axial st · axial · 0.88mm/px · z∈[+1151,+1561]mm · 12 of 94 slices shown, 14 images]
[im 6/94  soft-tissue]
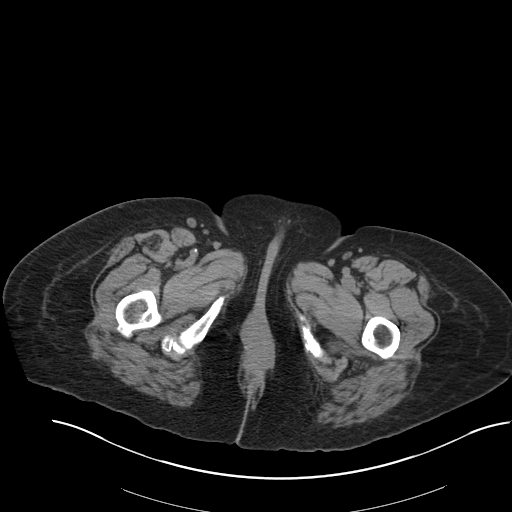
[im 6/94  bone]
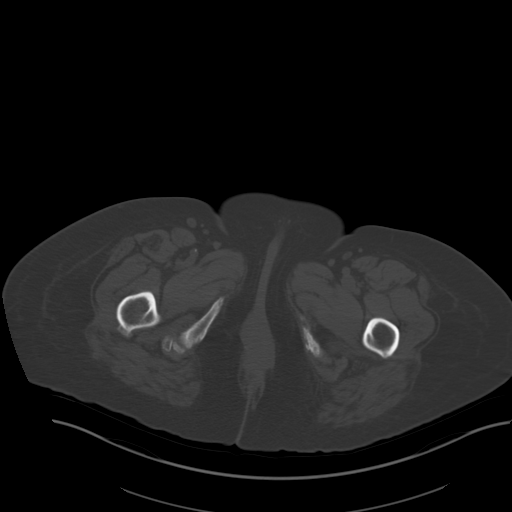
[im 12/94  soft-tissue]
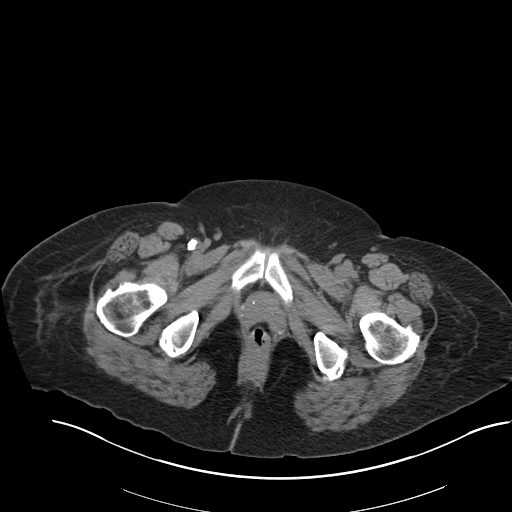
[im 24/94  soft-tissue]
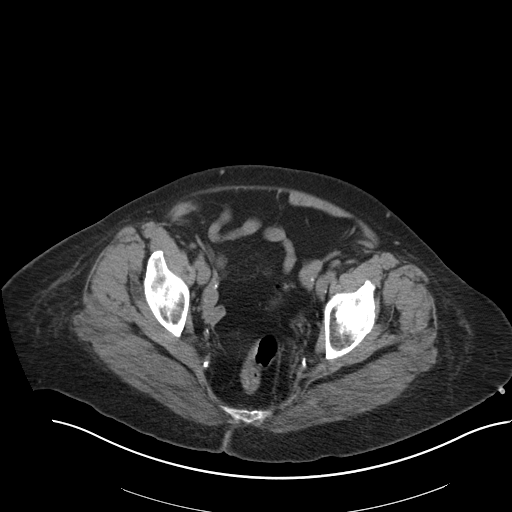
[im 30/94  soft-tissue]
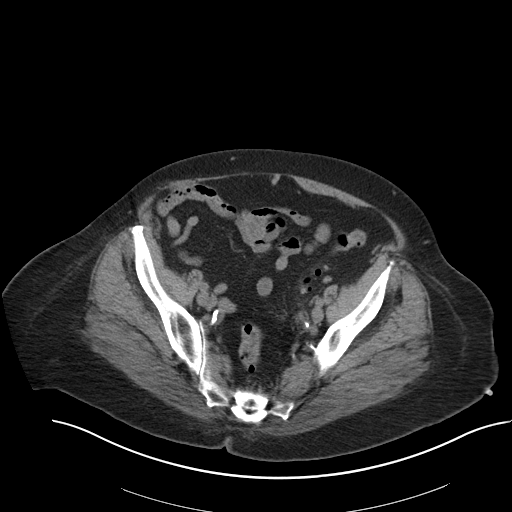
[im 35/94  soft-tissue]
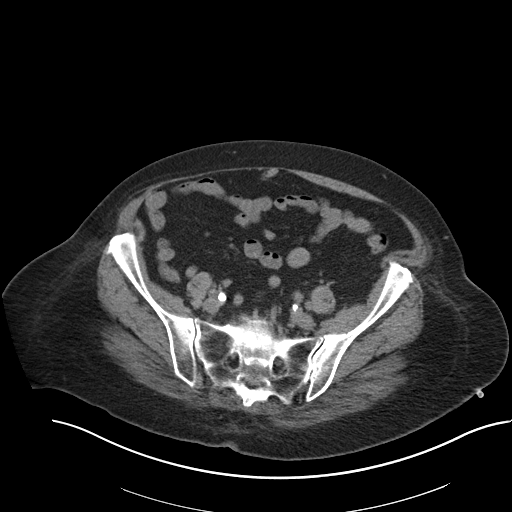
[im 41/94  soft-tissue]
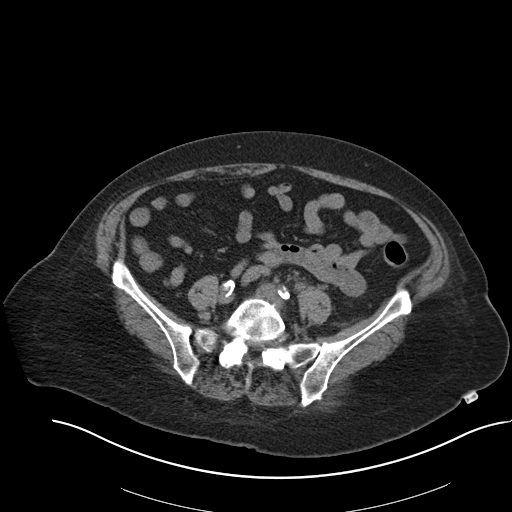
[im 53/94  soft-tissue]
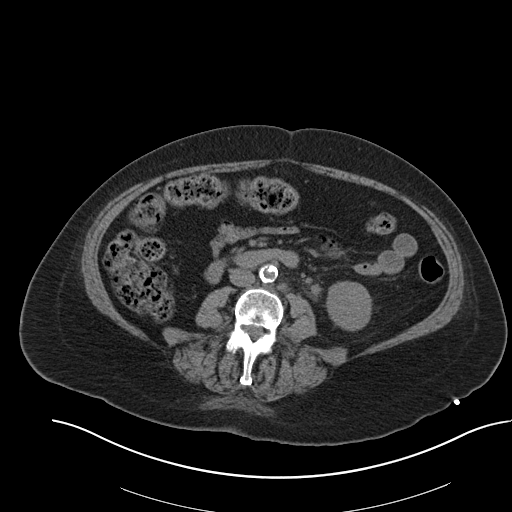
[im 59/94  soft-tissue]
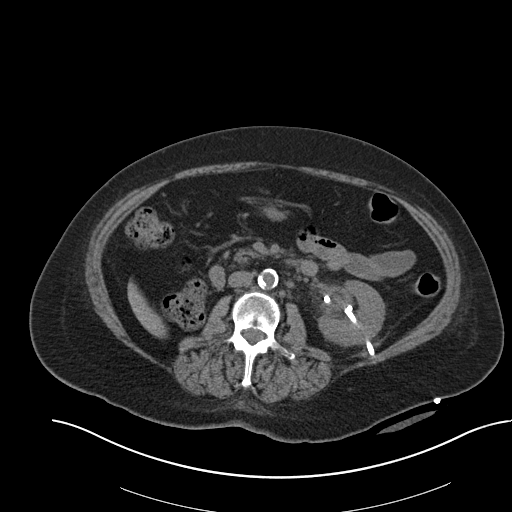
[im 64/94  soft-tissue]
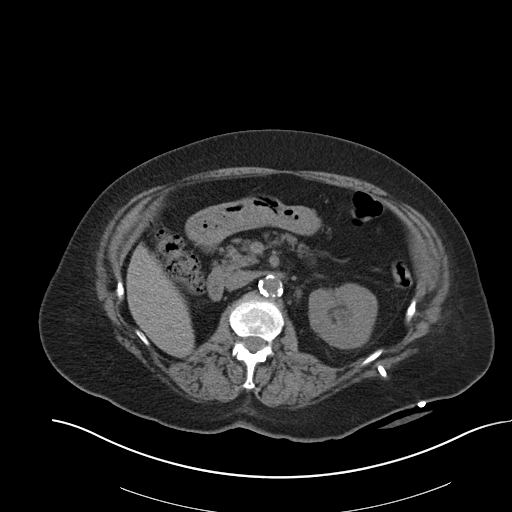
[im 64/94  bone]
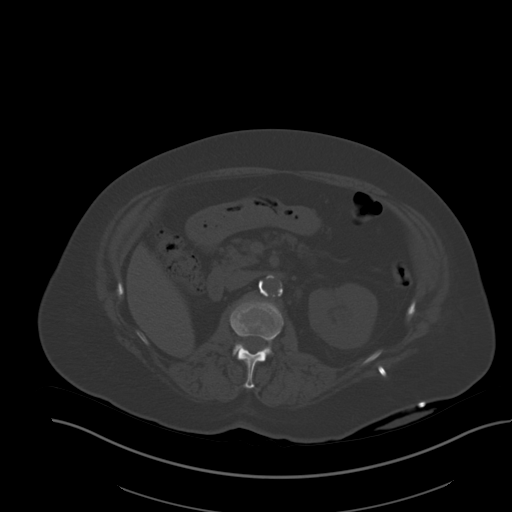
[im 70/94  soft-tissue]
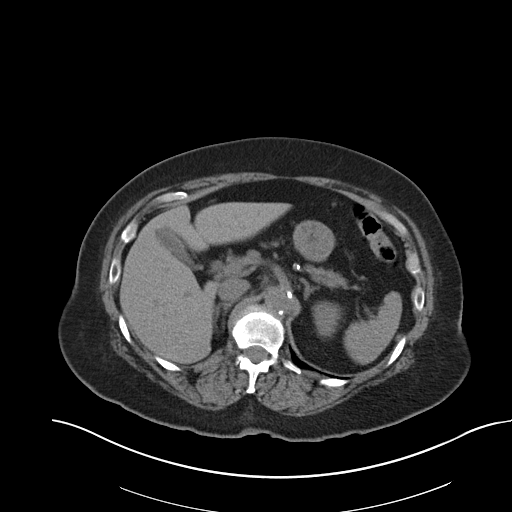
[im 82/94  soft-tissue]
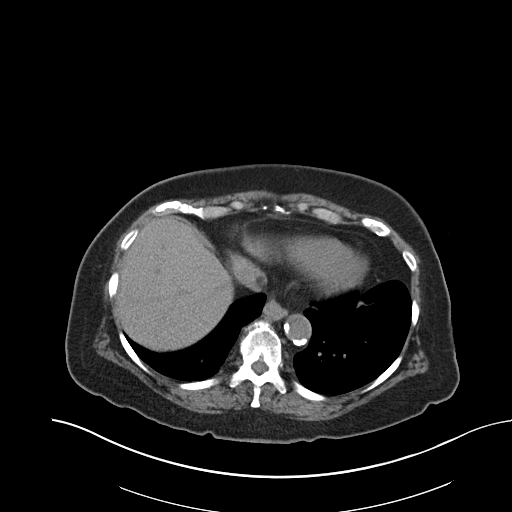
[im 88/94  soft-tissue]
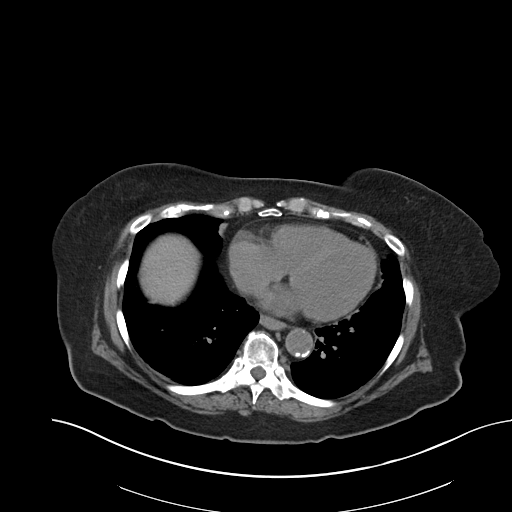

[Series 4: coronal st · coronal · 0.79mm/px · 3 of 151 slices shown]
[im 51/151  soft-tissue]
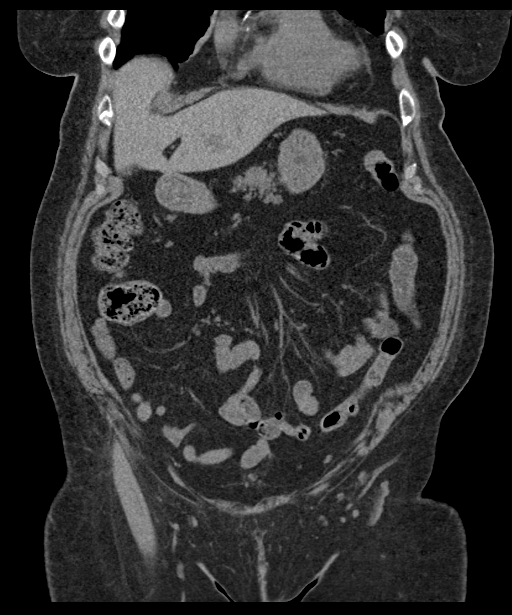
[im 67/151  soft-tissue]
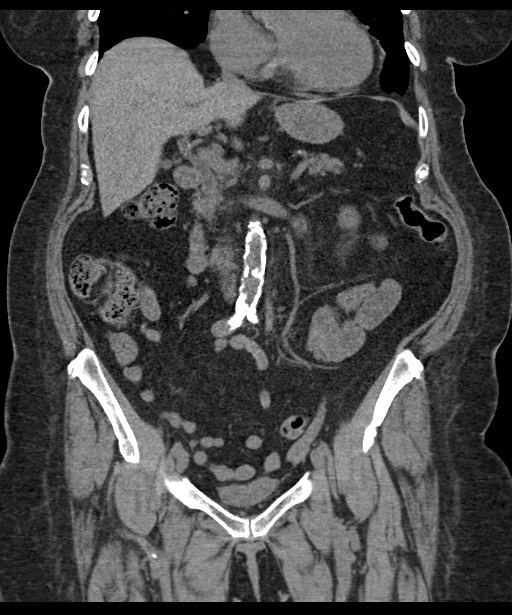
[im 84/151  soft-tissue]
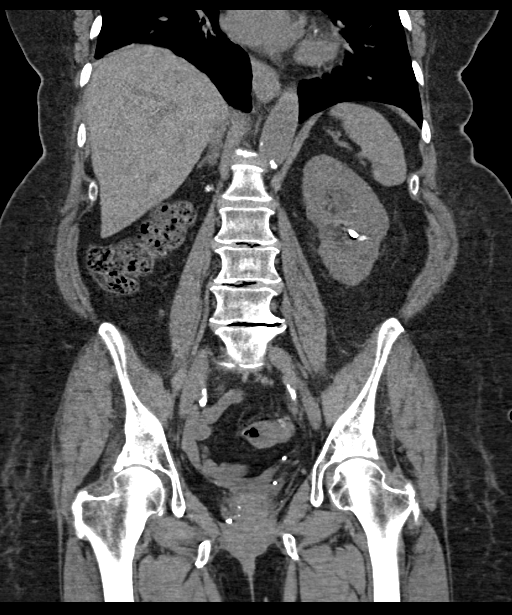

[15 of 46 positions shown; findings below may reference images not displayed]

FINDINGS: Lower chest: Aortic valve leaflet and coronary artery calcification.
Aortic calcification.

Hepatobiliary: No focal liver abnormality. No gallstones,
gallbladder wall thickening, or pericholecystic fluid. No biliary
dilatation.

Pancreas: No focal lesion. Normal pancreatic contour. No surrounding
inflammatory changes. No main pancreatic ductal dilatation.

Spleen: Normal in size without focal abnormality.

Adrenals/Urinary Tract:

Status post right nephrectomy. Left percutaneous nephrostomy tube
with pigtail terminating within the renal pelvis. Mild left
hydroureter. Peri ureteral fat stranding along the left ureter.

No nephrolithiasis and no hydronephrosis. No definite
contour-deforming renal mass.

No ureterolithiasis or hydroureter.

The urinary bladder is unremarkable.

Stomach/Bowel: Stomach is within normal limits. No evidence of bowel
wall thickening or dilatation. Scattered colonic diverticulosis.
Appendix appears normal.

Vascular/Lymphatic: No abdominal aorta or iliac aneurysm. Severe
atherosclerotic plaque of the aorta and its branches. No abdominal,
pelvic, or inguinal lymphadenopathy.

Reproductive: Status post hysterectomy. No adnexal masses.

Other: No intraperitoneal free fluid. No intraperitoneal free gas.
No organized fluid collection.

Musculoskeletal:

No abdominal wall hernia or abnormality.

No suspicious lytic or blastic osseous lesions. No acute displaced
fracture. Multilevel degenerative changes of the spine.
IMPRESSION: 1. Mild left hydroureter with periureteral fat stranding concerning
for infection. Recommend correlation with urinalysis. Percutaneous
nephrostomy tube in appropriate position with no hydronephrosis.
2. Right nephrectomy.
3. Colonic diverticulosis with no acute diverticulitis.
4. Aortic Atherosclerosis (CALME-KEQ.Q).

## 2024-03-05 ENCOUNTER — Other Ambulatory Visit: Payer: Self-pay | Admitting: Physician Assistant

## 2024-03-05 ENCOUNTER — Other Ambulatory Visit: Payer: Self-pay | Admitting: Internal Medicine

## 2024-03-05 DIAGNOSIS — E782 Mixed hyperlipidemia: Secondary | ICD-10-CM

## 2024-03-10 ENCOUNTER — Other Ambulatory Visit: Payer: Self-pay | Admitting: Internal Medicine

## 2024-03-10 DIAGNOSIS — E782 Mixed hyperlipidemia: Secondary | ICD-10-CM

## 2024-03-27 ENCOUNTER — Emergency Department (HOSPITAL_COMMUNITY)

## 2024-03-27 ENCOUNTER — Encounter (HOSPITAL_COMMUNITY): Payer: Self-pay

## 2024-03-27 ENCOUNTER — Inpatient Hospital Stay (HOSPITAL_COMMUNITY)
Admission: EM | Admit: 2024-03-27 | Discharge: 2024-04-03 | DRG: 683 | Disposition: A | Discharge status: Home / Self Care | Attending: Internal Medicine | Admitting: Internal Medicine

## 2024-03-27 DIAGNOSIS — Z7982 Long term (current) use of aspirin: Secondary | ICD-10-CM

## 2024-03-27 DIAGNOSIS — I5020 Unspecified systolic (congestive) heart failure: Secondary | ICD-10-CM | POA: Diagnosis present

## 2024-03-27 DIAGNOSIS — R197 Diarrhea, unspecified: Secondary | ICD-10-CM | POA: Diagnosis not present

## 2024-03-27 DIAGNOSIS — Z9221 Personal history of antineoplastic chemotherapy: Secondary | ICD-10-CM

## 2024-03-27 DIAGNOSIS — G4733 Obstructive sleep apnea (adult) (pediatric): Secondary | ICD-10-CM | POA: Diagnosis present

## 2024-03-27 DIAGNOSIS — E860 Dehydration: Secondary | ICD-10-CM | POA: Diagnosis not present

## 2024-03-27 DIAGNOSIS — N179 Acute kidney failure, unspecified: Principal | ICD-10-CM | POA: Diagnosis present

## 2024-03-27 DIAGNOSIS — I10 Essential (primary) hypertension: Secondary | ICD-10-CM | POA: Diagnosis not present

## 2024-03-27 DIAGNOSIS — Z8551 Personal history of malignant neoplasm of bladder: Secondary | ICD-10-CM

## 2024-03-27 DIAGNOSIS — E861 Hypovolemia: Secondary | ICD-10-CM | POA: Diagnosis present

## 2024-03-27 DIAGNOSIS — E876 Hypokalemia: Secondary | ICD-10-CM | POA: Diagnosis present

## 2024-03-27 DIAGNOSIS — Z79899 Other long term (current) drug therapy: Secondary | ICD-10-CM | POA: Diagnosis not present

## 2024-03-27 DIAGNOSIS — E872 Acidosis, unspecified: Secondary | ICD-10-CM | POA: Diagnosis not present

## 2024-03-27 DIAGNOSIS — K580 Irritable bowel syndrome with diarrhea: Secondary | ICD-10-CM | POA: Diagnosis present

## 2024-03-27 DIAGNOSIS — I130 Hypertensive heart and chronic kidney disease with heart failure and stage 1 through stage 4 chronic kidney disease, or unspecified chronic kidney disease: Secondary | ICD-10-CM | POA: Diagnosis not present

## 2024-03-27 DIAGNOSIS — Z833 Family history of diabetes mellitus: Secondary | ICD-10-CM

## 2024-03-27 DIAGNOSIS — N184 Chronic kidney disease, stage 4 (severe): Secondary | ICD-10-CM | POA: Diagnosis present

## 2024-03-27 DIAGNOSIS — Z90710 Acquired absence of both cervix and uterus: Secondary | ICD-10-CM

## 2024-03-27 DIAGNOSIS — B3749 Other urogenital candidiasis: Secondary | ICD-10-CM | POA: Diagnosis present

## 2024-03-27 DIAGNOSIS — Z87891 Personal history of nicotine dependence: Secondary | ICD-10-CM | POA: Diagnosis not present

## 2024-03-27 DIAGNOSIS — Z8554 Personal history of malignant neoplasm of ureter: Secondary | ICD-10-CM

## 2024-03-27 DIAGNOSIS — N3001 Acute cystitis with hematuria: Secondary | ICD-10-CM

## 2024-03-27 DIAGNOSIS — F411 Generalized anxiety disorder: Secondary | ICD-10-CM | POA: Diagnosis present

## 2024-03-27 DIAGNOSIS — E782 Mixed hyperlipidemia: Secondary | ICD-10-CM | POA: Diagnosis present

## 2024-03-27 DIAGNOSIS — Z881 Allergy status to other antibiotic agents status: Secondary | ICD-10-CM

## 2024-03-27 DIAGNOSIS — I5042 Chronic combined systolic (congestive) and diastolic (congestive) heart failure: Secondary | ICD-10-CM | POA: Diagnosis not present

## 2024-03-27 DIAGNOSIS — R112 Nausea with vomiting, unspecified: Principal | ICD-10-CM

## 2024-03-27 DIAGNOSIS — I2510 Atherosclerotic heart disease of native coronary artery without angina pectoris: Secondary | ICD-10-CM | POA: Diagnosis present

## 2024-03-27 DIAGNOSIS — R748 Abnormal levels of other serum enzymes: Secondary | ICD-10-CM

## 2024-03-27 DIAGNOSIS — Z8249 Family history of ischemic heart disease and other diseases of the circulatory system: Secondary | ICD-10-CM

## 2024-03-27 DIAGNOSIS — I129 Hypertensive chronic kidney disease with stage 1 through stage 4 chronic kidney disease, or unspecified chronic kidney disease: Secondary | ICD-10-CM | POA: Diagnosis not present

## 2024-03-27 DIAGNOSIS — C679 Malignant neoplasm of bladder, unspecified: Secondary | ICD-10-CM | POA: Diagnosis not present

## 2024-03-27 DIAGNOSIS — D649 Anemia, unspecified: Secondary | ICD-10-CM | POA: Diagnosis not present

## 2024-03-27 DIAGNOSIS — D72828 Other elevated white blood cell count: Secondary | ICD-10-CM | POA: Diagnosis present

## 2024-03-27 DIAGNOSIS — Z961 Presence of intraocular lens: Secondary | ICD-10-CM | POA: Diagnosis present

## 2024-03-27 DIAGNOSIS — D631 Anemia in chronic kidney disease: Secondary | ICD-10-CM | POA: Diagnosis present

## 2024-03-27 DIAGNOSIS — Z888 Allergy status to other drugs, medicaments and biological substances status: Secondary | ICD-10-CM

## 2024-03-27 DIAGNOSIS — K219 Gastro-esophageal reflux disease without esophagitis: Secondary | ICD-10-CM | POA: Diagnosis present

## 2024-03-27 DIAGNOSIS — Z905 Acquired absence of kidney: Secondary | ICD-10-CM | POA: Diagnosis not present

## 2024-03-27 DIAGNOSIS — K5730 Diverticulosis of large intestine without perforation or abscess without bleeding: Secondary | ICD-10-CM | POA: Diagnosis not present

## 2024-03-27 DIAGNOSIS — R1030 Lower abdominal pain, unspecified: Secondary | ICD-10-CM | POA: Diagnosis not present

## 2024-03-27 DIAGNOSIS — E871 Hypo-osmolality and hyponatremia: Secondary | ICD-10-CM | POA: Diagnosis not present

## 2024-03-27 DIAGNOSIS — R1032 Left lower quadrant pain: Secondary | ICD-10-CM | POA: Diagnosis not present

## 2024-03-27 DIAGNOSIS — Z86711 Personal history of pulmonary embolism: Secondary | ICD-10-CM

## 2024-03-27 DIAGNOSIS — K449 Diaphragmatic hernia without obstruction or gangrene: Secondary | ICD-10-CM | POA: Diagnosis not present

## 2024-03-27 LAB — COMPREHENSIVE METABOLIC PANEL WITH GFR
ALT: 41 U/L (ref 0–44)
AST: 50 U/L — ABNORMAL HIGH (ref 15–41)
Albumin: 2.9 g/dL — ABNORMAL LOW (ref 3.5–5.0)
Alkaline Phosphatase: 117 U/L (ref 38–126)
Anion gap: 14 (ref 5–15)
BUN: 79 mg/dL — ABNORMAL HIGH (ref 8–23)
CO2: 20 mmol/L — ABNORMAL LOW (ref 22–32)
Calcium: 8.4 mg/dL — ABNORMAL LOW (ref 8.9–10.3)
Chloride: 97 mmol/L — ABNORMAL LOW (ref 98–111)
Creatinine, Ser: 7.87 mg/dL — ABNORMAL HIGH (ref 0.44–1.00)
GFR, Estimated: 5 mL/min — ABNORMAL LOW (ref >60)
Glucose, Bld: 114 mg/dL — ABNORMAL HIGH (ref 70–99)
Potassium: 3.9 mmol/L (ref 3.5–5.1)
Sodium: 131 mmol/L — ABNORMAL LOW (ref 135–145)
Total Bilirubin: 0.6 mg/dL (ref 0.0–1.2)
Total Protein: 8.3 g/dL — ABNORMAL HIGH (ref 6.5–8.1)

## 2024-03-27 LAB — LIPASE, BLOOD: Lipase: 38 U/L (ref 11–51)

## 2024-03-27 LAB — CBC
HCT: 28.9 % — ABNORMAL LOW (ref 36.0–46.0)
Hemoglobin: 8.9 g/dL — ABNORMAL LOW (ref 12.0–15.0)
MCH: 27.9 pg (ref 26.0–34.0)
MCHC: 30.8 g/dL (ref 30.0–36.0)
MCV: 90.6 fL (ref 80.0–100.0)
Platelets: 303 10*3/uL (ref 150–400)
RBC: 3.19 MIL/uL — ABNORMAL LOW (ref 3.87–5.11)
RDW: 15.3 % (ref 11.5–15.5)
WBC: 12.9 10*3/uL — ABNORMAL HIGH (ref 4.0–10.5)
nRBC: 0 % (ref 0.0–0.2)

## 2024-03-27 MED ORDER — FAMOTIDINE 20 MG PO TABS
20.0000 mg | ORAL_TABLET | Freq: Every day | ORAL | Status: DC
  Administered 2024-03-28 – 2024-04-03 (×7): 20 mg via ORAL
  Filled 2024-03-27 (×7): qty 1

## 2024-03-27 MED ORDER — ISOSORBIDE MONONITRATE ER 30 MG PO TB24
15.0000 mg | ORAL_TABLET | Freq: Every day | ORAL | Status: DC
  Administered 2024-03-28 – 2024-04-03 (×7): 15 mg via ORAL
  Filled 2024-03-27 (×7): qty 1

## 2024-03-27 MED ORDER — SERTRALINE HCL 50 MG PO TABS
50.0000 mg | ORAL_TABLET | Freq: Every morning | ORAL | Status: DC
  Administered 2024-03-28 – 2024-04-03 (×7): 50 mg via ORAL
  Filled 2024-03-27 (×7): qty 1

## 2024-03-27 MED ORDER — ROSUVASTATIN CALCIUM 10 MG PO TABS
40.0000 mg | ORAL_TABLET | Freq: Every day | ORAL | Status: DC
  Filled 2024-03-27: qty 4

## 2024-03-27 MED ORDER — ONDANSETRON HCL 4 MG/2ML IJ SOLN
4.0000 mg | Freq: Once | INTRAMUSCULAR | Status: AC
  Administered 2024-03-27: 4 mg via INTRAVENOUS
  Filled 2024-03-27: qty 2

## 2024-03-27 MED ORDER — SODIUM CHLORIDE 0.9 % IV BOLUS
1000.0000 mL | Freq: Once | INTRAVENOUS | Status: AC
  Administered 2024-03-27: 1000 mL via INTRAVENOUS

## 2024-03-27 MED ORDER — CARVEDILOL 6.25 MG PO TABS
6.2500 mg | ORAL_TABLET | Freq: Two times a day (BID) | ORAL | Status: DC
  Administered 2024-03-28 – 2024-04-03 (×13): 6.25 mg via ORAL
  Filled 2024-03-27 (×13): qty 1

## 2024-03-27 MED ORDER — MORPHINE SULFATE (PF) 4 MG/ML IV SOLN
4.0000 mg | Freq: Once | INTRAVENOUS | Status: AC
  Administered 2024-03-27: 4 mg via INTRAVENOUS
  Filled 2024-03-27: qty 1

## 2024-03-27 MED ORDER — ONDANSETRON HCL 4 MG PO TABS
4.0000 mg | ORAL_TABLET | Freq: Four times a day (QID) | ORAL | Status: DC | PRN
  Administered 2024-04-01: 4 mg via ORAL
  Filled 2024-03-27: qty 1

## 2024-03-27 MED ORDER — ONDANSETRON HCL 4 MG/2ML IJ SOLN
4.0000 mg | Freq: Four times a day (QID) | INTRAMUSCULAR | Status: DC | PRN
  Administered 2024-03-29: 4 mg via INTRAVENOUS
  Filled 2024-03-27: qty 2

## 2024-03-27 MED ORDER — SODIUM CHLORIDE 0.9 % IV SOLN: INTRAVENOUS | Status: AC

## 2024-03-27 MED ORDER — HYDRALAZINE HCL 20 MG/ML IJ SOLN: 10.0000 mg | Freq: Four times a day (QID) | INTRAMUSCULAR | Status: DC | PRN

## 2024-03-27 MED ORDER — ALBUTEROL SULFATE (2.5 MG/3ML) 0.083% IN NEBU
2.5000 mg | INHALATION_SOLUTION | RESPIRATORY_TRACT | Status: DC | PRN
Start: 2024-03-27 — End: 2024-04-03
  Filled 2024-03-27: qty 3

## 2024-03-27 MED ORDER — HEPARIN SODIUM (PORCINE) 5000 UNIT/ML IJ SOLN
5000.0000 [IU] | Freq: Three times a day (TID) | INTRAMUSCULAR | Status: DC
  Administered 2024-03-28 – 2024-03-30 (×7): 5000 [IU] via SUBCUTANEOUS
  Filled 2024-03-27 (×7): qty 1

## 2024-03-27 MED ORDER — ACETAMINOPHEN 650 MG RE SUPP: 650.0000 mg | Freq: Four times a day (QID) | RECTAL | Status: DC | PRN

## 2024-03-27 MED ORDER — ACETAMINOPHEN 325 MG PO TABS
650.0000 mg | ORAL_TABLET | Freq: Four times a day (QID) | ORAL | Status: DC | PRN
  Administered 2024-03-28 – 2024-04-01 (×4): 650 mg via ORAL
  Filled 2024-03-27 (×6): qty 2

## 2024-03-27 NOTE — ED Provider Notes (Signed)
 [place] EMERGENCY DEPARTMENT AT [facility] Provider Note   CSN: [id number] Arrival date & time: 03/27/24  1613     Patient presents with: Abdominal Pain, Emesis, and Diarrhea   [name] is a 78 y.o. female.   78 y.o female with a PMH of depression, generalized anxiety, hypertension presents to the ED with a chief complaint of generalized abdominal pain, nausea and vomiting for the past 2 weeks.  Patient went to see her primary care doctor Dr. Arvel Lather at [facility].  She reported they are having ongoing vomiting and diarrhea for the past several weeks, has not been eating, has not been able to take her medication and is not able to keep any fluid down.  She has been eating pop tarts in the morning which surprisingly stay.  Nephrology had previously suggested dialysis but she has not started this.  She had treatment for her prior cancer approximately 2 months ago, reports no improvement in her symptoms.  Also appears pale.  Endorsing multiple episodes of diarrhea but no visible blood that she has seen.  No fever, no chest pain, no shortness of breath.  The history is provided by the patient.  Abdominal Pain Associated symptoms: diarrhea and vomiting   Associated symptoms: no chest pain, no chills, no fever and no shortness of breath   Emesis Associated symptoms: abdominal pain and diarrhea   Associated symptoms: no chills and no fever   Diarrhea Associated symptoms: abdominal pain and vomiting   Associated symptoms: no chills and no fever        Prior to Admission medications   Medication Sig Start Date End Date Taking? Authorizing Provider  acetaminophen  (TYLENOL ) 500 MG tablet Take 500-1,000 mg by mouth every 6 (six) hours as needed for mild pain.    [provider]  aspirin  EC 81 MG tablet Take 1 tablet (81 mg total) by mouth daily. Swallow whole. 01/09/23   Flo Hummingbird, PA-C  calcium  carbonate (TUMS EX) 750 MG chewable tablet Chew 1 tablet  by mouth every morning.    [provider]  carvedilol  (COREG ) 6.25 MG tablet TAKE 1 TABLET(6.25 MG) BY MOUTH TWICE DAILY WITH A MEAL 02/04/24   Chandrasekhar, Mahesh A, MD  cholecalciferol  (VITAMIN D3) 25 MCG (1000 UNIT) tablet Take 1,000 Units by mouth every morning.    [provider]  Ferrous Sulfate (IRON PO) Take 1 tablet by mouth in the morning.    [provider]  icosapent  Ethyl (VASCEPA ) 1 g capsule TAKE 1 CAPSULE(1 GRAM) BY MOUTH TWICE DAILY 03/10/24   Chandrasekhar, Mahesh A, MD  isosorbide  mononitrate (IMDUR ) 30 MG 24 hr tablet TAKE 1/2 TABLET(15 MG) BY MOUTH DAILY 03/06/24   Chandrasekhar, Mahesh A, MD  rosuvastatin  (CRESTOR ) 40 MG tablet Take 1 tablet (40 mg total) by mouth daily. 01/31/24   Chandrasekhar, Caretha Chapel, MD  sertraline  (ZOLOFT ) 50 MG tablet Take 50 mg by mouth every morning.    [provider]  traMADol  (ULTRAM ) 50 MG tablet Take 1 tablet (50 mg total) by mouth every 6 (six) hours as needed for moderate pain (pain score 4-6) (post-operatively). 01/30/24 01/29/25  Melody Spurling., MD  traZODone  (DESYREL ) 150 MG tablet Take 150 mg by mouth at bedtime. 08/24/20   [provider]    Allergies: Erythromycin, Lotensin [benazepril hcl], and Macrodantin [nitrofurantoin]    Review of Systems  Constitutional:  Negative for chills and fever.  Respiratory:  Negative for shortness of breath.  Cardiovascular:  Negative for chest pain.  Gastrointestinal:  Positive for abdominal pain, diarrhea and vomiting.  Genitourinary:  Negative for difficulty urinating and flank pain.  All other systems reviewed and are negative.   Updated Vital Signs BP 135/62   Pulse 61   Temp 98.1 F (36.7 C)   Resp 18   Ht 5' 3 (1.6 m)   Wt 74.4 kg   SpO2 97%   BMI 29.05 kg/m   Physical Exam Vitals and nursing note reviewed.  Constitutional:      General: She is not in acute distress.    Appearance: She is well-developed.  HENT:     Head:  Normocephalic and atraumatic.     Mouth/Throat:     Pharynx: No oropharyngeal exudate.   Eyes:     Pupils: Pupils are equal, round, and reactive to light.     Comments: Conjunctiva appears pale.   Cardiovascular:     Rate and Rhythm: Regular rhythm.     Heart sounds: Normal heart sounds.  Pulmonary:     Effort: Pulmonary effort is normal. No respiratory distress.     Breath sounds: Normal breath sounds.  Abdominal:     General: Bowel sounds are normal. There is no distension.     Palpations: Abdomen is soft.     Tenderness: There is generalized abdominal tenderness. There is no right CVA tenderness, left CVA tenderness, guarding or rebound.   Musculoskeletal:        General: No tenderness or deformity.     Cervical back: Normal range of motion.     Right lower leg: No edema.     Left lower leg: No edema.   Skin:    General: Skin is warm and dry.   Neurological:     Mental Status: She is alert and oriented to person, place, and time.     (all labs ordered are listed, but only abnormal results are displayed) Labs Reviewed  COMPREHENSIVE METABOLIC PANEL WITH GFR - Abnormal; Notable for the following components:      Result Value   Sodium 131 (*)    Chloride 97 (*)    CO2 20 (*)    Glucose, Bld 114 (*)    BUN 79 (*)    Creatinine, Ser 7.87 (*)    Calcium  8.4 (*)    Total Protein 8.3 (*)    Albumin  2.9 (*)    AST 50 (*)    GFR, Estimated 5 (*)    All other components within normal limits  CBC - Abnormal; Notable for the following components:   WBC 12.9 (*)    RBC 3.19 (*)    Hemoglobin 8.9 (*)    HCT 28.9 (*)    All other components within normal limits  LIPASE, BLOOD  URINALYSIS, ROUTINE W REFLEX MICROSCOPIC    EKG: None  Radiology: CT ABDOMEN PELVIS WO CONTRAST Result Date: 03/27/2024 EXAM: CT ABDOMEN AND PELVIS WITHOUT CONTRAST 03/27/2024 09:18:00 PM TECHNIQUE: CT of the abdomen and pelvis was performed without the administration of intravenous contrast.  Multiplanar reformatted images are provided for review. Automated exposure control, iterative reconstruction, and/or weight based adjustment of the mA/kV was utilized to reduce the radiation dose to as low as reasonably achievable. COMPARISON: None available. CLINICAL HISTORY: Abdominal pain, acute, nonlocalized. Pt c/o LLQ abdominal pain and n/v/d x3 weeks. Pain score 10/10. Pt has not taken anything for symptoms. Pt hasn't had any cancer treatments or interventions in several months. FINDINGS: LOWER CHEST: Tiny hiatal  hernia. LIVER: The liver is unremarkable. GALLBLADDER AND BILE DUCTS: Gallbladder is unremarkable. No biliary ductal dilatation. SPLEEN: No acute abnormality. PANCREAS: No acute abnormality. ADRENAL GLANDS: No acute abnormality. KIDNEYS, URETERS AND BLADDER: Status post right nephroureterectomy. Left double-pigtail ureteral stent in satisfactory position. No hydronephrosis. No perinephric or periureteral stranding. Urinary bladder is unremarkable. GI AND BOWEL: Sigmoid diverticulosis, without evidence of diverticulitis. Normal appendix (image 53). There is no bowel obstruction. No bowel wall thickening. PERITONEUM AND RETROPERITONEUM: No ascites. No free air. VASCULATURE: Atherosclerotic calcifications of the abdominal aorta and branch vessels. Progressive left periaortic nodes measuring up to 10 mm short axis (image 42), previously 6 mm. LYMPH NODES: Progressive left periaortic nodes measuring up to 10 mm short axis (image 42), previously 6 mm. REPRODUCTIVE ORGANS: No acute abnormality. BONES AND SOFT TISSUES: No acute osseous abnormality. No focal soft tissue abnormality. IMPRESSION: 1. Left ureteral stent in satisfactory position. 2. Status post right nephroureterectomy. 3. Progressive left periaortic nodes measuring up to 10 mm short axis, suspicious for nodal metastases. Electronically signed by: Zadie Herter MD 03/27/2024 09:27 PM EDT RP Workstation: [id number]     Procedures    Medications Ordered in the ED  ondansetron  (ZOFRAN ) injection 4 mg (4 mg Intravenous Given 03/27/24 2112)  morphine  (PF) 4 MG/ML injection 4 mg (4 mg Intravenous Given 03/27/24 2115)  sodium chloride  0.9 % bolus 1,000 mL (1,000 mLs Intravenous New Bag/Given 03/27/24 2110)    Clinical Course as of 03/27/24 2230  Thu Mar 27, 2024  1958 WBC(!): 12.9 [JS]  2049 Creatinine(!): 7.87 Significantly worsen than prior [JS]  2049 BUN(!): 79 [JS]    Clinical Course User Index [JS] Areonna Bran, PA-C                                 Medical Decision Making Amount and/or Complexity of Data Reviewed Labs: ordered. Decision-making details documented in ED Course. Radiology: ordered.  Risk Prescription drug management.   This patient presents to the ED for concern of nausea, vomiting, diarrhea, this involves a number of treatment options, and is a complaint that carries with it a high risk of complications and morbidity.  The differential diagnosis includes sepsis, failure to thrive worsen metastatic disease.    Co morbidities: Discussed in HPI   Brief History:  See HPI   EMR reviewed including pt PMHx, past surgical history and past visits to ER.   See HPI for more details   Lab Tests:  I ordered and independently interpreted labs.  The pertinent results include:    CBC with a leukocytosis of 12.9, hemoglobin is decreased from prior at 8.9, conjunctiva does appear pale.  She reports ongoing diarrhea but no blood in her stool.  CMP with a worsening creatinine of 7.87, BUN is 79.  LFTs remarkable for slight elevation of AST.  Imaging Studies:  CT Abdomen pelvis showed: IMPRESSION:  1. Left ureteral stent in satisfactory position.  2. Status post right nephroureterectomy.  3. Progressive left periaortic nodes measuring up to 10 mm short axis,  suspicious for nodal metastases.   Medicines ordered:  I ordered medication including zofran , morphine   for symptomatic treatment.   Reevaluation of the patient after these medicines showed that the patient stayed the same I have reviewed the patients home medicines and have made adjustments as needed  Consults:  I requested consultation with nephrology,  and discussed lab and imaging findings as well as pertinent plan -  they recommend: fluids, renal ultrasound will be evaluated in the morning.   Reevaluation:  After the interventions noted above I re-evaluated patient and found that they have :stayed the same  Social Determinants of Health:  The patient's social determinants of health were a factor in the care of this patient  Problem List / ED Course:  Patient presented to the ED by PCPs office Dr. Sharry Deem, after visit today she was noted to have generalized abdominal pain for the past 2 weeks, worsening diarrhea has taken Imodium several times without any improvement in symptoms.  Decrease in oral intake, not able to keep anything down.  She was previously being treated for looks like a neoplasm of her urinary tract, according to husband at the bedside she is a CKD stage IV patient, however has not had any recent follow-up.  On today's visit her kidney function has worsened at 7.87 from a previous level of 3.  Her BUN is also elevated at 79, patient does appear volume depleted. Her lites also decreased such as sodium, chloride, wbc was elevated, lipase level is within normal limits.  CT is suspicious of metastatic disease on scan, I did discuss results with nephrology of worsening creatinine, they do recommend ultrasound, volume repletion, will consult hospitalist service for further admission at this time. Spoke to Dr. Andy Bannister who will admit patient. Hemodynamically stable.    Dispostion:  After consideration of the diagnostic results and the patients response to treatment, I feel that the patent would benefit from admission for further workup of their worsening creatinine, likely metastatic  disease.  Portions of this note were generated with Scientist, clinical (histocompatibility and immunogenetics). Dictation errors may occur despite best attempts at proofreading.      Final diagnoses:  Nausea vomiting and diarrhea  Elevated creatine kinase    ED Discharge Orders     None          Adamarie Izzo, PA-C 03/27/24 2231    Tegeler, Marine Sia, MD 03/27/24 2315

## 2024-03-27 NOTE — ED Triage Notes (Addendum)
 Pt c/o LLQ abdominal pain and n/v/d x3 weeks.  Pain score 10/10.  Pt has not taken anything for symptoms.    Pt hasn't had any cancer treatments or interventions in several months.

## 2024-03-27 NOTE — ED Notes (Signed)
 ED TO INPATIENT HANDOFF REPORT  Name/Age/Gender [name] 78 y.o. female  Code Status    Code Status Orders  (From admission, onward)           Start     Ordered   03/27/24 2300  Full code  Continuous       Question:  By:  Answer:  Consent: discussion documented in EHR   03/27/24 2302           Code Status History     Date Active Date Inactive Code Status Order ID Comments User Context   08/02/2023 2043 08/10/2023 1742 Full Code [id number]  Selene Dais, MD ED   11/16/2022 0833 11/17/2022 1612 Full Code [id number]  Trent Frizzle, MD Inpatient   05/30/2022 1128 06/11/2022 1535 Full Code [id number]  Hoyt Macleod, MD ED   05/15/2022 2214 05/16/2022 2025 Full Code [id number]  Unk Garb, DO Inpatient   05/15/2022 2124 05/15/2022 2214 Full Code [id number]  Unk Garb, DO Inpatient   04/21/2022 2144 04/26/2022 1823 Full Code [id number]  Sabas Cradle, MD ED   11/06/2019 1401 11/06/2019 2259 Full Code [id number]  Arty Binning, MD Inpatient   06/25/2019 0439 07/02/2019 2013 Full Code [id number]  Fidencio Hue, MD ED       Home/SNF/Other Home  Chief Complaint Acute renal failure (HCC) [N17.9]  Level of Care/Admitting Diagnosis ED Disposition     ED Disposition  Admit   Condition  --   Comment  Hospital Area: [facility] [[id number]]  Level of Care: Telemetry [5]  Admit to tele based on following criteria: Other see comments  Comments: ...aki  May admit patient to [facility] or [facility] if equivalent level of care is available:: No  Covid Evaluation: Asymptomatic - no recent exposure (last 10 days) testing not required  Diagnosis: Acute renal failure [facility]) [[id number]]  Admitting Physician: Sabas Cradle [[id number]]  Attending Physician: Sabas Cradle [[id number]]  Certification:: I certify this patient will need inpatient services for at least 2 midnights  Expected Medical Readiness: 04/01/2024          Medical History Past Medical  History:  Diagnosis Date   Anemia associated with chronic renal failure    Arthritis    Blood dyscrasia 2008   hx of PE   CAD in native artery 10/2019   cardiologist-   dr Melven Stable. Paulita Boss;   a. 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   urologist--- dr dahlstedt/  oncologist--- dr Dirk Fredericks;   dx 09/ 2020 high grade urothelial  s/p laser ablation, ;  recurrent s/p BCG instillation,  completed chemo instilation 11/ 2022  and repeat chemo completed 05/ 2023   Chronic combined systolic and diastolic CHF (congestive heart failure) (HCC) 06/2019   followed by cardiology;   dx 09/ 2022 in setting of sepsis, pulm edema;   05/ 2022  ef 40-45% per echo,  recovered per cath 01/ 2021 ef 50%;  laste echo 08/ 2023  ef 60-65%   CKD (chronic kidney disease), stage III (HCC)    Depression    Dysrhythmia    bradycardia   GAD (generalized anxiety disorder)    GERD (gastroesophageal reflux disease)    History of bladder cancer 12/2006   followed by dr Joie Narrow   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   dx 04/ 2008  w/ poor function hydronephrotic  right kidney;  02-06-2007  s/p right nephroureterectomy   History of kidney stones    History of pulmonary embolism 04/2022   in setting severe sepsis;  small RLL, treated w/ 3 months eliquis    Hyperlipidemia, mixed    Hypertension    Solitary kidney, acquired 02/06/2007   s/p  right nephroureterectomy for cancer    Allergies Allergies  Allergen Reactions   Erythromycin Diarrhea and Nausea And Vomiting   Lotensin [Benazepril Hcl] Cough   Macrodantin [Nitrofurantoin] Diarrhea    And night sweats    IV Location/Drains/Wounds Patient Lines/Drains/Airways Status     Active Line/Drains/Airways     Name Placement date Placement time Site Days   Peripheral IV 03/27/24 20 G Left Antecubital 03/27/24  2100  Antecubital  less than 1   Ureteral Drain/Stent  Left ureter 7 Fr. 01/30/24  0750  Left ureter  57            Labs/Imaging Results for orders placed or performed during the hospital encounter of 03/27/24 (from the past 48 hours)  Lipase, blood     Status: None   Collection Time: 03/27/24  4:44 PM  Result Value Ref Range   Lipase 38 11 - 51 U/L    Comment: Performed at [facility], [address] [place], [place] [address]  Comprehensive metabolic panel     Status: Abnormal   Collection Time: 03/27/24  4:44 PM  Result Value Ref Range   Sodium 131 (L) 135 - 145 mmol/L   Potassium 3.9 3.5 - 5.1 mmol/L   Chloride 97 (L) 98 - 111 mmol/L   CO2 20 (L) 22 - 32 mmol/L   Glucose, Bld 114 (H) 70 - 99 mg/dL    Comment: Glucose reference range applies only to samples taken after fasting for at least 8 hours.   BUN 79 (H) 8 - 23 mg/dL   Creatinine, Ser 1.91 (H) 0.44 - 1.00 mg/dL   Calcium  8.4 (L) 8.9 - 10.3 mg/dL   Total Protein 8.3 (H) 6.5 - 8.1 g/dL   Albumin  2.9 (L) 3.5 - 5.0 g/dL   AST 50 (H) 15 - 41 U/L   ALT 41 0 - 44 U/L   Alkaline Phosphatase 117 38 - 126 U/L   Total Bilirubin 0.6 0.0 - 1.2 mg/dL   GFR, Estimated 5 (L) >60 mL/min    Comment: (NOTE) Calculated using the CKD-EPI Creatinine Equation (2021)    Anion gap 14 5 - 15    Comment: Performed at [facility], [address] [place], [place] [address]  CBC     Status: Abnormal   Collection Time: 03/27/24  4:44 PM  Result Value Ref Range   WBC 12.9 (H) 4.0 - 10.5 K/uL   RBC 3.19 (L) 3.87 - 5.11 MIL/uL   Hemoglobin 8.9 (L) 12.0 - 15.0 g/dL   HCT 56.2 (L) 13.0 - 86.5 %   MCV 90.6 80.0 - 100.0 fL   MCH 27.9 26.0 - 34.0 pg   MCHC 30.8 30.0 - 36.0 g/dL   RDW 78.4 69.6 - 29.5 %   Platelets 303 150 - 400 K/uL   nRBC 0.0 0.0 - 0.2 %    Comment: Performed at [facility], [address] [place], [place] [address]   CT ABDOMEN PELVIS WO CONTRAST Result Date: 03/27/2024 EXAM: CT ABDOMEN AND PELVIS WITHOUT CONTRAST  03/27/2024 09:18:00 PM TECHNIQUE: CT of the abdomen and pelvis was performed without the administration of intravenous contrast. Multiplanar reformatted  images are provided for review. Automated exposure control, iterative reconstruction, and/or weight based adjustment of the mA/kV was utilized to reduce the radiation dose to as low as reasonably achievable. COMPARISON: None available. CLINICAL HISTORY: Abdominal pain, acute, nonlocalized. Pt c/o LLQ abdominal pain and n/v/d x3 weeks. Pain score 10/10. Pt has not taken anything for symptoms. Pt hasn't had any cancer treatments or interventions in several months. FINDINGS: LOWER CHEST: Tiny hiatal hernia. LIVER: The liver is unremarkable. GALLBLADDER AND BILE DUCTS: Gallbladder is unremarkable. No biliary ductal dilatation. SPLEEN: No acute abnormality. PANCREAS: No acute abnormality. ADRENAL GLANDS: No acute abnormality. KIDNEYS, URETERS AND BLADDER: Status post right nephroureterectomy. Left double-pigtail ureteral stent in satisfactory position. No hydronephrosis. No perinephric or periureteral stranding. Urinary bladder is unremarkable. GI AND BOWEL: Sigmoid diverticulosis, without evidence of diverticulitis. Normal appendix (image 53). There is no bowel obstruction. No bowel wall thickening. PERITONEUM AND RETROPERITONEUM: No ascites. No free air. VASCULATURE: Atherosclerotic calcifications of the abdominal aorta and branch vessels. Progressive left periaortic nodes measuring up to 10 mm short axis (image 42), previously 6 mm. LYMPH NODES: Progressive left periaortic nodes measuring up to 10 mm short axis (image 42), previously 6 mm. REPRODUCTIVE ORGANS: No acute abnormality. BONES AND SOFT TISSUES: No acute osseous abnormality. No focal soft tissue abnormality. IMPRESSION: 1. Left ureteral stent in satisfactory position. 2. Status post right nephroureterectomy. 3. Progressive left periaortic nodes measuring up to 10 mm short axis, suspicious for nodal  metastases. Electronically signed by: Zadie Herter MD 03/27/2024 09:27 PM EDT RP Workstation: [id number]    Pending Labs Unresulted Labs (From admission, onward)     Start     Ordered   03/28/24 0500  Comprehensive metabolic panel  Tomorrow morning,   R        03/27/24 2302   03/28/24 0500  CBC  Tomorrow morning,   R        03/27/24 2302   03/27/24 2315  Urinalysis, Routine w reflex microscopic -Urine, Clean Catch  Once,   R       Question:  Specimen Source  Answer:  Urine, Clean Catch   03/27/24 2314   03/27/24 2315  Lactic acid, plasma  (Lactic Acid)  STAT Now then every 3 hours,   R (with STAT occurrences)      03/27/24 2314   03/27/24 2315  Procalcitonin  Once,   R       References:    Procalcitonin Lower Respiratory Tract Infection AND Sepsis Procalcitonin Algorithm   03/27/24 2314   03/27/24 2314  Gastrointestinal Panel by PCR , Stool  (Gastrointestinal Panel by PCR, Stool                                                                                                                                                     **Does Not include  CLOSTRIDIUM DIFFICILE testing. **If CDIFF testing is needed, place order from the C Difficile Testing order set.**)  Once,   R        03/27/24 2314   03/27/24 2313  Blood gas, venous  Once,   R        03/27/24 2312   03/27/24 2312  Basic metabolic panel  Once,   R        03/27/24 2312   03/27/24 2300  CBC  (heparin )  Once,   R       Comments: Baseline for heparin  therapy IF NOT ALREADY DRAWN.  Notify MD if PLT < 100 K.    03/27/24 2302   03/27/24 1643  Urinalysis, Routine w reflex microscopic -Urine, Clean Catch  Once,   URGENT       Question:  Specimen Source  Answer:  Urine, Clean Catch   03/27/24 1643            Vitals/Pain Today's Vitals   03/27/24 2100 03/27/24 2115 03/27/24 2130 03/27/24 2200  BP: (!) 154/77   135/62  Pulse: 66  (!) 58 61  Resp:    18  Temp:    98.1 F (36.7 C)  TempSrc:      SpO2: 100%  100% 97%   Weight:      Height:      PainSc:  5       Isolation Precautions Enteric precautions (UV disinfection)  Medications Medications  heparin  injection 5,000 Units (has no administration in time range)  0.9 %  sodium chloride  infusion (has no administration in time range)  acetaminophen  (TYLENOL ) tablet 650 mg (has no administration in time range)    Or  acetaminophen  (TYLENOL ) suppository 650 mg (has no administration in time range)  ondansetron  (ZOFRAN ) tablet 4 mg (has no administration in time range)    Or  ondansetron  (ZOFRAN ) injection 4 mg (has no administration in time range)  albuterol  (PROVENTIL ) (2.5 MG/3ML) 0.083% nebulizer solution 2.5 mg (has no administration in time range)  hydrALAZINE  (APRESOLINE ) injection 10 mg (has no administration in time range)  carvedilol  (COREG ) tablet 6.25 mg (has no administration in time range)  isosorbide  mononitrate (IMDUR ) 24 hr tablet 15 mg (has no administration in time range)  rosuvastatin  (CRESTOR ) tablet 40 mg (has no administration in time range)  sertraline  (ZOLOFT ) tablet 50 mg (has no administration in time range)  famotidine  (PEPCID ) tablet 20 mg (has no administration in time range)  ondansetron  (ZOFRAN ) injection 4 mg (4 mg Intravenous Given 03/27/24 2112)  morphine  (PF) 4 MG/ML injection 4 mg (4 mg Intravenous Given 03/27/24 2115)  sodium chloride  0.9 % bolus 1,000 mL (1,000 mLs Intravenous New Bag/Given 03/27/24 2110)    Mobility walks

## 2024-03-27 NOTE — H&P (Signed)
 History and Physical    [name] FMW:[id number] DOB: 03-Feb-1946 DOA: 03/27/2024  PCP: Chrystal Lamarr RAMAN, MD  Patient coming from: Home,pcp  referral  I have personally briefly reviewed patient's old medical records in [facility]  Chief Complaint: abdominal pain , emesis and diarrhea x 3 weeks  HPI: [name] is a 78 y.o. female with medical history significant of HLD, Hx of PE,HTN,CAD non obstructive CAD, OSA not on CPAP, bladder cancer s/p right nephroureterectomy for cancer with last chemo treatment 2 mo ago ,anemia,GERD, CHF pef ef 63%,, hx of PE 2022, CKD IV with progression followed by nephrology most recent labs 6/10 noted cr of 3.3, patient presents to ED with diffuse abdominal pain associated with n/v/d x2-3 weeks. Patient has followed up with pcp for these symptoms and there was plan for CT scan , patient however today was noted to appear severely dehydrated  and due to this was sent into ED for further evaluation. Patient notes no fever or chills. Notes notes she is not able to tolerated po. She notes no blood in stool, no dysuria .     ED Course:  Patient was evaluated in ED and found to have AKI on CKDIV in setting of n/v/d and poor intake . Case was discussed with on call nephrology who recommended renal imaging and hydration with f/u in am. Of note Ctab/pelvis noted no acute findings but did note suspected progression of know bladder CA  to periaortic node.   Afeb , bo 133/85, hr 73, rr 20 sat 98% on ra  Wbc 12.9, hgb 8.9, plt 303 Lipase 38  Na 131, K 3.9, CL97, bicarb 20, cr 7.87 up from ( 3.15) CTAB/.Pelvis IMPRESSION: 1. Left ureteral stent in satisfactory position. 2. Status post right nephroureterectomy. 3. Progressive left periaortic nodes measuring up to 10 mm short axis, suspicious for nodal metastases.  Tx ns 1L , zofran  4mg  Review of Systems: As per HPI otherwise 10 point review of systems negative.   Past Medical History:  Diagnosis  Date   Anemia associated with chronic renal failure    Arthritis    Blood dyscrasia 2008   hx of PE   CAD in native artery 10/2019   cardiologist-   dr christella. santo;   a. 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   urologist--- dr dahlstedt/  oncologist--- dr amadeo;   dx 09/ 2020 high grade urothelial  s/p laser ablation, ;  recurrent s/p BCG instillation,  completed chemo instilation 11/ 2022  and repeat chemo completed 05/ 2023   Chronic combined systolic and diastolic CHF (congestive heart failure) (HCC) 06/2019   followed by cardiology;   dx 09/ 2022 in setting of sepsis, pulm edema;   05/ 2022  ef 40-45% per echo,  recovered per cath 01/ 2021 ef 50%;  laste echo 08/ 2023  ef 60-65%   CKD (chronic kidney disease), stage III (HCC)    Depression    Dysrhythmia    bradycardia   GAD (generalized anxiety disorder)    GERD (gastroesophageal reflux disease)    History of bladder cancer 12/2006   followed by dr matilda   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   dx 04/ 2008  w/ poor function hydronephrotic  right kidney;   02-06-2007  s/p right nephroureterectomy   History of kidney stones    History of pulmonary embolism  04/2022   in setting severe sepsis;  small RLL, treated w/ 3 months eliquis    Hyperlipidemia, mixed    Hypertension    Solitary kidney, acquired 02/06/2007   s/p  right nephroureterectomy for cancer    Past Surgical History:  Procedure Laterality Date   CATARACT EXTRACTION W/ INTRAOCULAR LENS IMPLANT Bilateral 2011   CYSTOSCOPY W/ RETROGRADES Left 01/30/2024   Procedure: CYSTOSCOPY, WITH RETROGRADE PYELOGRAM;  Surgeon: Alvaro Ricardo KATHEE Mickey., MD;  Location: [facility];  Service: Urology;  Laterality: Left;   CYSTOSCOPY W/ URETERAL STENT PLACEMENT Left 11/17/2019   Procedure: CYSTOSCOPY WITH STENT REPLACEMENT;  Surgeon: Matilda Senior, MD;  Location: [facility];  Service: Urology;  Laterality: Left;   CYSTOSCOPY W/ URETERAL STENT PLACEMENT Left 03/23/2022   Procedure: CYSTOSCOPY WITH RETROGRADE PYELOGRAM, LEFT URETERAL STENT PLACEMENT;  Surgeon: Matilda Senior, MD;  Location: [facility];  Service: Urology;  Laterality: Left;   CYSTOSCOPY W/ URETERAL STENT PLACEMENT Left 06/09/2022   Procedure: CYSTOSCOPY WITH LEFT STENT EXCHANGE;  Surgeon: Matilda Senior, MD;  Location: [facility];  Service: Urology;  Laterality: Left;   CYSTOSCOPY W/ URETERAL STENT PLACEMENT Left 12/04/2022   Procedure: CYSTOSCOPY WITH STENT REPLACEMENT;  Surgeon: Matilda Senior, MD;  Location: [facility];  Service: Urology;  Laterality: Left;   CYSTOSCOPY W/ URETERAL STENT REMOVAL Left 05/10/2020   Procedure: CYSTOSCOPY WITH STENT REMOVAL;  Surgeon: Matilda Senior, MD;  Location: [facility];  Service: Urology;  Laterality: Left;   CYSTOSCOPY W/ URETERAL STENT REMOVAL Left 05/02/2021   Procedure: CYSTOSCOPY WITH STENT REMOVAL;  Surgeon: Matilda Senior, MD;  Location: [facility];  Service: Urology;  Laterality: Left;   CYSTOSCOPY W/ URETERAL STENT REMOVAL Left 11/16/2022   Procedure: CYSTOSCOPY WITH STENT REMOVAL;  Surgeon: Matilda Senior, MD;  Location: [facility];  Service: Urology;  Laterality: Left;   CYSTOSCOPY WITH RETROGRADE PYELOGRAM, URETEROSCOPY AND STENT PLACEMENT Left 06/27/2019   Procedure: CYSTOSCOPY WITH LEFT RETROGRADE PYELOGRAM, URETEROSCOPY, BIOPSY AND LEFT STENT PLACEMENT;  Surgeon: Watt Rush, MD;  Location: [facility];  Service: Urology;  Laterality: Left;   CYSTOSCOPY WITH RETROGRADE PYELOGRAM, URETEROSCOPY AND STENT PLACEMENT Left 08/18/2019   Procedure: CYSTOSCOPY, URETEROSCOPY AND STENT EXCHANGE;  Surgeon: Matilda Senior, MD;  Location: [facility];  Service: Urology;  Laterality: Left;  90 MINS   CYSTOSCOPY WITH RETROGRADE PYELOGRAM, URETEROSCOPY AND STENT  PLACEMENT Left 11/17/2019   Procedure: CYSTOSCOPY WITH RETROGRADE PYELOGRAM, URETEROSCOPY AND STENT PLACEMENT;  Surgeon: Matilda Senior, MD;  Location: [facility];  Service: Urology;  Laterality: Left;  90 MINS   CYSTOSCOPY WITH RETROGRADE PYELOGRAM, URETEROSCOPY AND STENT PLACEMENT Left 05/10/2020   Procedure: CYSTOSCOPY WITH RETROGRADE PYELOGRAM, URETEROSCOPY AND STENT PLACEMENT WITH URETHRAL DIALATION AND BRUSH BIOPSY;  Surgeon: Matilda Senior, MD;  Location: [facility];  Service: Urology;  Laterality: Left;  1 HR   CYSTOSCOPY WITH RETROGRADE PYELOGRAM, URETEROSCOPY AND STENT PLACEMENT Left 09/16/2020   Procedure: CYSTOSCOPY WITH RETROGRADE PYELOGRAM, URETEROSCOPY ,  LITHOPAXY, LEFT URETERAL BRUSHING, AND STENT REPLACEMENT;  Surgeon: Matilda Senior, MD;  Location: [facility];  Service: Urology;  Laterality: Left;   CYSTOSCOPY WITH RETROGRADE PYELOGRAM, URETEROSCOPY AND STENT PLACEMENT Left 10/24/2021   Procedure: CYSTOSCOPY WITH RETROGRADE PYELOGRAM, URETEROSCOPY, POSSIBLE URETERAL AND RENAL BIOPSIES AND STENT PLACEMENT;  Surgeon: Matilda Senior, MD;  Location: [facility];  Service: Urology;  Laterality: Left;  1 HR   CYSTOSCOPY WITH RETROGRADE PYELOGRAM, URETEROSCOPY AND STENT PLACEMENT  Left 03/09/2022   Procedure: CYSTOSCOPY WITH ANTEGRADE PYELOGRAM,  STENT REMOVAL;  Surgeon: Matilda Senior, MD;  Location: [facility];  Service: Urology;  Laterality: Left;   CYSTOSCOPY WITH RETROGRADE PYELOGRAM, URETEROSCOPY AND STENT PLACEMENT  01/09/2007   @[facility]  by dr matilda;    bx's/ washing  bladder , right ureter   CYSTOSCOPY WITH RETROGRADE PYELOGRAM, URETEROSCOPY AND STENT PLACEMENT Left 11/16/2022   Procedure: CYSTOSCOPY WITH RETROGRADE PYELOGRAM, URETEROSCOPY AND STENT REPLACEMENT;  Surgeon: Matilda Senior, MD;  Location: [facility];  Service: Urology;  Laterality: Left;  90 MINS    CYSTOSCOPY WITH RETROGRADE PYELOGRAM, URETEROSCOPY AND STENT PLACEMENT Left 07/06/2023   Procedure: CYSTOSCOPY WITH LEFT RETROGRADE PYELOGRAM, URETEROSCOPY, LASER ABLATION OF TUMOR AND STENT EXCHANGE;  Surgeon: Alvaro Ricardo KATHEE Mickey., MD;  Location: [facility];  Service: Urology;  Laterality: Left;  60 MINUTES NEEDED FOR CASE   CYSTOSCOPY/URETEROSCOPY/HOLMIUM LASER Left 12/04/2022   Procedure: CYSTOSCOPY/URETEROSCOPY/  HOLMIUM LASER OF RENAL PELVIC LESIONS;  Surgeon: Matilda Senior, MD;  Location: [facility];  Service: Urology;  Laterality: Left;   CYSTOSCOPY/URETEROSCOPY/HOLMIUM LASER/STENT PLACEMENT Left 05/02/2021   Procedure: CYSTOSCOPY/URETEROSCOPY WITH BRUSH BIOPSY/ RETROGRADE PYELOGRAM/ HOLMIUM LASER/STENT REPLACEMENT;  Surgeon: Matilda Senior, MD;  Location: [facility];  Service: Urology;  Laterality: Left;   CYSTOSCOPY/URETEROSCOPY/HOLMIUM LASER/STENT PLACEMENT Left 01/30/2024   Procedure: CYSTOSCOPY/DIAGNOSTIC URETEROSCOPY/ RETROGRADE/STENT EXCHANGE;  Surgeon: Alvaro Ricardo KATHEE Mickey., MD;  Location: [facility];  Service: Urology;  Laterality: Left;   HOLMIUM LASER APPLICATION Left 09/16/2020   Procedure: HOLMIUM LASER APPLICATION;  Surgeon: Matilda Senior, MD;  Location: [facility];  Service: Urology;  Laterality: Left;   HOLMIUM LASER APPLICATION Left 10/24/2021   Procedure: HOLMIUM LASER APPLICATION OF TUMORS;  Surgeon: Matilda Senior, MD;  Location: [facility];  Service: Urology;  Laterality: Left;   HOLMIUM LASER APPLICATION Left 11/16/2022   Procedure: HOLMIUM LASER OF TUMORS;  Surgeon: Matilda Senior, MD;  Location: [facility];  Service: Urology;  Laterality: Left;   IR NEPHROSTOMY PLACEMENT LEFT  07/06/2021   IR NEPHROSTOMY PLACEMENT LEFT  02/06/2022   NEPHROSTOMY TUBE REMOVAL Left 03/23/2022   Procedure: NEPHROSTOMY TUBE REMOVAL;  Surgeon: Matilda Senior, MD;  Location: [facility];  Service: Urology;  Laterality: Left;   NEPHROURETERECTOMY Right 02/06/2007   @[facility]  by dr matilda;   Laparoscopic   RIGHT/LEFT HEART CATH AND CORONARY ANGIOGRAPHY N/A 11/06/2019   Procedure: RIGHT/LEFT HEART CATH AND CORONARY ANGIOGRAPHY;  Surgeon: Claudene Victory ORN, MD;  Location: [facility];  Service: Cardiovascular;  Laterality: N/A;   THULIUM LASER TURP (TRANSURETHRAL RESECTION OF PROSTATE) Left 08/18/2019   Procedure: THULIUM LASER ABLATION OF URETERAL TUMOR;  Surgeon: Matilda Senior, MD;  Location: [facility];  Service: Urology;  Laterality: Left;   THULIUM LASER TURP (TRANSURETHRAL RESECTION OF PROSTATE) Left 11/17/2019   Procedure: THULIUM LASER of URETERAL CANCER;  Surgeon: Matilda Senior, MD;  Location: [facility];  Service: Urology;  Laterality: Left;   TRANSURETHRAL RESECTION OF BLADDER TUMOR  12/12/2006   @[facility]  by dr andra   VAGINAL HYSTERECTOMY  1980     reports that she quit smoking about 20 years ago. Her smoking use included cigarettes. She started smoking about 58 years ago. She has a 19 pack-year smoking history. She has never used smokeless tobacco. She reports that she does not currently use alcohol. She reports that she does not use drugs.  Allergies  Allergen Reactions   Erythromycin Diarrhea  and Nausea And Vomiting   Lotensin [Benazepril Hcl] Cough   Macrodantin [Nitrofurantoin] Diarrhea    And night sweats    Family History  Problem Relation Age of Onset   Hypertension Mother    Diabetes Mellitus II Sister    Hypertension Sister    Hypertension Brother     Prior to Admission medications   Medication Sig Start Date End Date Taking? Authorizing Provider  acetaminophen  (TYLENOL ) 500 MG tablet Take 500-1,000 mg by mouth every 6 (six) hours as needed for mild pain.    [provider]  aspirin  EC 81 MG tablet Take 1 tablet (81 mg total) by mouth daily. Swallow whole. 01/09/23   Parthenia Olivia HERO, PA-C  calcium  carbonate  (TUMS EX) 750 MG chewable tablet Chew 1 tablet by mouth every morning.    [provider]  carvedilol  (COREG ) 6.25 MG tablet TAKE 1 TABLET(6.25 MG) BY MOUTH TWICE DAILY WITH A MEAL 02/04/24   Chandrasekhar, Mahesh A, MD  cholecalciferol  (VITAMIN D3) 25 MCG (1000 UNIT) tablet Take 1,000 Units by mouth every morning.    [provider]  Ferrous Sulfate  (IRON PO) Take 1 tablet by mouth in the morning.    [provider]  icosapent  Ethyl (VASCEPA ) 1 g capsule TAKE 1 CAPSULE(1 GRAM) BY MOUTH TWICE DAILY 03/10/24   Chandrasekhar, Mahesh A, MD  isosorbide  mononitrate (IMDUR ) 30 MG 24 hr tablet TAKE 1/2 TABLET(15 MG) BY MOUTH DAILY 03/06/24   Chandrasekhar, Mahesh A, MD  rosuvastatin  (CRESTOR ) 40 MG tablet Take 1 tablet (40 mg total) by mouth daily. 01/31/24   Santo Stanly LABOR, MD  sertraline  (ZOLOFT ) 50 MG tablet Take 50 mg by mouth every morning.    [provider]  traMADol  (ULTRAM ) 50 MG tablet Take 1 tablet (50 mg total) by mouth every 6 (six) hours as needed for moderate pain (pain score 4-6) (post-operatively). 01/30/24 01/29/25  Alvaro Ricardo KATHEE Mickey., MD  traZODone  (DESYREL ) 150 MG tablet Take 150 mg by mouth at bedtime. 08/24/20   [provider]    Physical Exam: Vitals:   03/27/24 2045 03/27/24 2100 03/27/24 2130 03/27/24 2200  BP: (!) 166/76 (!) 154/77  135/62  Pulse: 63 66 (!) 58 61  Resp:    18  Temp:    98.1 F (36.7 C)  TempSrc:      SpO2: 100% 100% 100% 97%  Weight:      Height:        Constitutional: NAD, calm, comfortable Vitals:   03/27/24 2045 03/27/24 2100 03/27/24 2130 03/27/24 2200  BP: (!) 166/76 (!) 154/77  135/62  Pulse: 63 66 (!) 58 61  Resp:    18  Temp:    98.1 F (36.7 C)  TempSrc:      SpO2: 100% 100% 100% 97%  Weight:      Height:       Eyes: PERRL, lids and conjunctivae normal ENMT: Mucous membranes are moist. Posterior pharynx clear of any exudate or lesions.Normal dentition.  Neck: normal, supple, no  masses, no thyromegaly Respiratory: clear to auscultation bilaterally, no wheezing, no crackles. Normal respiratory effort. No accessory muscle use.  Cardiovascular: Regular rate and rhythm, no murmurs / rubs / gallops. No extremity edema. 2+ pedal pulses.  Abdomen: no tenderness, no masses palpated. No hepatosplenomegaly. Bowel sounds positive.  Musculoskeletal: no clubbing / cyanosis. No joint deformity upper and lower extremities. Good ROM, no contractures. Normal muscle tone.  Skin: no rashes, lesions, ulcers. No induration Neurologic: CN 2-12  grossly intact. Sensation intact, . Strength 5/5 in all 4.  Psychiatric: Normal judgment and insight. Alert and oriented x 3. Normal mood.    Labs on Admission: I have personally reviewed following labs and imaging studies  CBC: Recent Labs  Lab 03/27/24 1644  WBC 12.9*  HGB 8.9*  HCT 28.9*  MCV 90.6  PLT 303   Basic Metabolic Panel: Recent Labs  Lab 03/27/24 1644  NA 131*  K 3.9  CL 97*  CO2 20*  GLUCOSE 114*  BUN 79*  CREATININE 7.87*  CALCIUM  8.4*   GFR: Estimated Creatinine Clearance: 6 mL/min (A) (by C-G formula based on SCr of 7.87 mg/dL (H)). Liver Function Tests: Recent Labs  Lab 03/27/24 1644  AST 50*  ALT 41  ALKPHOS 117  BILITOT 0.6  PROT 8.3*  ALBUMIN  2.9*   Recent Labs  Lab 03/27/24 1644  LIPASE 38   No results for input(s): AMMONIA in the last 168 hours. Coagulation Profile: No results for input(s): INR, PROTIME in the last 168 hours. Cardiac Enzymes: No results for input(s): CKTOTAL, CKMB, CKMBINDEX, TROPONINI in the last 168 hours. BNP (last 3 results) No results for input(s): PROBNP in the last 8760 hours. HbA1C: No results for input(s): HGBA1C in the last 72 hours. CBG: No results for input(s): GLUCAP in the last 168 hours. Lipid Profile: No results for input(s): CHOL, HDL, LDLCALC, TRIG, CHOLHDL, LDLDIRECT in the last 72 hours. Thyroid  Function Tests: No  results for input(s): TSH, T4TOTAL, FREET4, T3FREE, THYROIDAB in the last 72 hours. Anemia Panel: No results for input(s): VITAMINB12, FOLATE, FERRITIN, TIBC, IRON, RETICCTPCT in the last 72 hours. Urine analysis:    Component Value Date/Time   COLORURINE YELLOW 08/02/2023 1804   APPEARANCEUR CLOUDY (A) 08/02/2023 1804   LABSPEC 1.006 08/02/2023 1804   PHURINE 5.0 08/02/2023 1804   GLUCOSEU NEGATIVE 08/02/2023 1804   HGBUR LARGE (A) 08/02/2023 1804   BILIRUBINUR NEGATIVE 08/02/2023 1804   KETONESUR NEGATIVE 08/02/2023 1804   PROTEINUR 30 (A) 08/02/2023 1804   NITRITE NEGATIVE 08/02/2023 1804   LEUKOCYTESUR LARGE (A) 08/02/2023 1804    Radiological Exams on Admission: CT ABDOMEN PELVIS WO CONTRAST Result Date: 03/27/2024 EXAM: CT ABDOMEN AND PELVIS WITHOUT CONTRAST 03/27/2024 09:18:00 PM TECHNIQUE: CT of the abdomen and pelvis was performed without the administration of intravenous contrast. Multiplanar reformatted images are provided for review. Automated exposure control, iterative reconstruction, and/or weight based adjustment of the mA/kV was utilized to reduce the radiation dose to as low as reasonably achievable. COMPARISON: None available. CLINICAL HISTORY: Abdominal pain, acute, nonlocalized. Pt c/o LLQ abdominal pain and n/v/d x3 weeks. Pain score 10/10. Pt has not taken anything for symptoms. Pt hasn't had any cancer treatments or interventions in several months. FINDINGS: LOWER CHEST: Tiny hiatal hernia. LIVER: The liver is unremarkable. GALLBLADDER AND BILE DUCTS: Gallbladder is unremarkable. No biliary ductal dilatation. SPLEEN: No acute abnormality. PANCREAS: No acute abnormality. ADRENAL GLANDS: No acute abnormality. KIDNEYS, URETERS AND BLADDER: Status post right nephroureterectomy. Left double-pigtail ureteral stent in satisfactory position. No hydronephrosis. No perinephric or periureteral stranding. Urinary bladder is unremarkable. GI AND BOWEL: Sigmoid  diverticulosis, without evidence of diverticulitis. Normal appendix (image 53). There is no bowel obstruction. No bowel wall thickening. PERITONEUM AND RETROPERITONEUM: No ascites. No free air. VASCULATURE: Atherosclerotic calcifications of the abdominal aorta and branch vessels. Progressive left periaortic nodes measuring up to 10 mm short axis (image 42), previously 6 mm. LYMPH NODES: Progressive left periaortic nodes measuring up to 10 mm short  axis (image 42), previously 6 mm. REPRODUCTIVE ORGANS: No acute abnormality. BONES AND SOFT TISSUES: No acute osseous abnormality. No focal soft tissue abnormality. IMPRESSION: 1. Left ureteral stent in satisfactory position. 2. Status post right nephroureterectomy. 3. Progressive left periaortic nodes measuring up to 10 mm short axis, suspicious for nodal metastases. Electronically signed by: Pinkie Pebbles MD 03/27/2024 09:27 PM EDT RP Workstation: [id number]    EKG: Independently reviewed.   Assessment/Plan AKI on CKDIV  -in setting of baseline progression with acute insult of poor po and gi losses  - admit to med tele  - note lytes are stable  - continue with ivfs  -renal imaging notes absent right kidney, left ureteral stent in place otherwise benign -monitor labs  -IVFS  -strict I/o / place foley cath  - f/u on neuro recs in am   Probably UTI -in setting of of loose stools  - hx of resistant UTI , will start meropenem  -f/u on urine culture   Diarrhea  - CT abd negative / current exam benign  -f/u on gi panel   Hx of Bladder cancer  -s/p right nephroureterectomy for cancer with last chemo treatment 2 mo ago  Hypertension , borderline - hold nephrotoxic medications  - continue carvedilol  and imdur   -prn hydralazine     CAD,nonobstructive -continue on asa, statin, carvedilol ,imdur   OSA -not on cpap  -prn at bedtime O2   CHFpef  -compensated continue on carvedilol   -ef 63% ,grade II diastolic dysfunction  GERD -ppi    HLD   -continue on crestor   DVT prophylaxis: heparin  Code Status: full/ as discussed per patient wishes in event of cardiac arrest  Family Communication: none at bedside Disposition Plan: patient  expected to be admitted greater than 2 midnights  Consults called: Nephrology  Admission status: med tele   Camila DELENA Ned MD [facility]   If 7PM-7AM, please contact night-coverage [email or web address] Password [email or web address]  03/27/2024, 10:37 PM

## 2024-03-28 ENCOUNTER — Other Ambulatory Visit: Payer: Self-pay

## 2024-03-28 DIAGNOSIS — E871 Hypo-osmolality and hyponatremia: Secondary | ICD-10-CM | POA: Diagnosis not present

## 2024-03-28 DIAGNOSIS — E860 Dehydration: Secondary | ICD-10-CM

## 2024-03-28 DIAGNOSIS — C679 Malignant neoplasm of bladder, unspecified: Secondary | ICD-10-CM

## 2024-03-28 DIAGNOSIS — N179 Acute kidney failure, unspecified: Secondary | ICD-10-CM | POA: Diagnosis not present

## 2024-03-28 DIAGNOSIS — N184 Chronic kidney disease, stage 4 (severe): Secondary | ICD-10-CM | POA: Diagnosis not present

## 2024-03-28 LAB — CBC
HCT: 24.1 % — ABNORMAL LOW (ref 36.0–46.0)
HCT: 26.3 % — ABNORMAL LOW (ref 36.0–46.0)
Hemoglobin: 7.6 g/dL — ABNORMAL LOW (ref 12.0–15.0)
Hemoglobin: 7.8 g/dL — ABNORMAL LOW (ref 12.0–15.0)
MCH: 28.1 pg (ref 26.0–34.0)
MCH: 28.2 pg (ref 26.0–34.0)
MCHC: 29.7 g/dL — ABNORMAL LOW (ref 30.0–36.0)
MCHC: 31.5 g/dL (ref 30.0–36.0)
MCV: 89.3 fL (ref 80.0–100.0)
MCV: 94.9 fL (ref 80.0–100.0)
Platelets: 250 10*3/uL (ref 150–400)
Platelets: 274 10*3/uL (ref 150–400)
RBC: 2.7 MIL/uL — ABNORMAL LOW (ref 3.87–5.11)
RBC: 2.77 MIL/uL — ABNORMAL LOW (ref 3.87–5.11)
RDW: 15.3 % (ref 11.5–15.5)
RDW: 15.3 % (ref 11.5–15.5)
WBC: 11.4 10*3/uL — ABNORMAL HIGH (ref 4.0–10.5)
WBC: 9.6 10*3/uL (ref 4.0–10.5)
nRBC: 0 % (ref 0.0–0.2)
nRBC: 0 % (ref 0.0–0.2)

## 2024-03-28 LAB — COMPREHENSIVE METABOLIC PANEL WITH GFR
ALT: 34 U/L (ref 0–44)
AST: 39 U/L (ref 15–41)
Albumin: 2.5 g/dL — ABNORMAL LOW (ref 3.5–5.0)
Alkaline Phosphatase: 93 U/L (ref 38–126)
Anion gap: 13 (ref 5–15)
BUN: 76 mg/dL — ABNORMAL HIGH (ref 8–23)
CO2: 18 mmol/L — ABNORMAL LOW (ref 22–32)
Calcium: 7.9 mg/dL — ABNORMAL LOW (ref 8.9–10.3)
Chloride: 103 mmol/L (ref 98–111)
Creatinine, Ser: 7.04 mg/dL — ABNORMAL HIGH (ref 0.44–1.00)
GFR, Estimated: 6 mL/min — ABNORMAL LOW (ref >60)
Glucose, Bld: 85 mg/dL (ref 70–99)
Potassium: 3.9 mmol/L (ref 3.5–5.1)
Sodium: 134 mmol/L — ABNORMAL LOW (ref 135–145)
Total Bilirubin: 0.5 mg/dL (ref 0.0–1.2)
Total Protein: 6.9 g/dL (ref 6.5–8.1)

## 2024-03-28 LAB — BASIC METABOLIC PANEL WITH GFR
Anion gap: 12 (ref 5–15)
BUN: 79 mg/dL — ABNORMAL HIGH (ref 8–23)
CO2: 18 mmol/L — ABNORMAL LOW (ref 22–32)
Calcium: 7.8 mg/dL — ABNORMAL LOW (ref 8.9–10.3)
Chloride: 102 mmol/L (ref 98–111)
Creatinine, Ser: 7.13 mg/dL — ABNORMAL HIGH (ref 0.44–1.00)
GFR, Estimated: 6 mL/min — ABNORMAL LOW (ref >60)
Glucose, Bld: 97 mg/dL (ref 70–99)
Potassium: 3.8 mmol/L (ref 3.5–5.1)
Sodium: 132 mmol/L — ABNORMAL LOW (ref 135–145)

## 2024-03-28 LAB — URINALYSIS, ROUTINE W REFLEX MICROSCOPIC
Bilirubin Urine: NEGATIVE
Glucose, UA: NEGATIVE mg/dL
Ketones, ur: NEGATIVE mg/dL
Nitrite: NEGATIVE
Protein, ur: 30 mg/dL — AB
Specific Gravity, Urine: 1.008 (ref 1.005–1.030)
WBC, UA: >50 WBC/hpf (ref 0–5)
pH: 5 (ref 5.0–8.0)

## 2024-03-28 LAB — LACTIC ACID, PLASMA
Lactic Acid, Venous: 0.6 mmol/L (ref 0.5–1.9)
Lactic Acid, Venous: 0.8 mmol/L (ref 0.5–1.9)

## 2024-03-28 LAB — BLOOD GAS, VENOUS
Acid-base deficit: 6.6 mmol/L — ABNORMAL HIGH (ref 0.0–2.0)
Bicarbonate: 19.1 mmol/L — ABNORMAL LOW (ref 20.0–28.0)
O2 Saturation: 61.9 %
Patient temperature: 37
pCO2, Ven: 38 mmHg — ABNORMAL LOW (ref 44–60)
pH, Ven: 7.31 (ref 7.25–7.43)
pO2, Ven: 37 mmHg (ref 32–45)

## 2024-03-28 LAB — PROCALCITONIN: Procalcitonin: 0.18 ng/mL

## 2024-03-28 MED ORDER — SODIUM CHLORIDE 0.9 % IV SOLN
1.0000 g | INTRAVENOUS | Status: DC
  Administered 2024-03-29 – 2024-03-30 (×2): 1 g via INTRAVENOUS
  Filled 2024-03-28 (×2): qty 10

## 2024-03-28 MED ORDER — ROSUVASTATIN CALCIUM 10 MG PO TABS
10.0000 mg | ORAL_TABLET | Freq: Every day | ORAL | Status: DC
  Administered 2024-03-28 – 2024-04-03 (×7): 10 mg via ORAL
  Filled 2024-03-28 (×6): qty 1

## 2024-03-28 MED ORDER — ORAL CARE MOUTH RINSE: 15.0000 mL | OROMUCOSAL | Status: DC | PRN

## 2024-03-28 MED ORDER — SODIUM CHLORIDE 0.9 % IV SOLN
1.0000 g | INTRAVENOUS | Status: DC
  Administered 2024-03-28: 1 g via INTRAVENOUS
  Filled 2024-03-28: qty 20

## 2024-03-28 NOTE — Progress Notes (Signed)
 Patient arrived to the unit at ~0015. Patient able to ambulate from stretcher to bed using walker. AxOx4. VSS. No c/o pain at this time. Patient's belongings at the bedside. Urine specimen collected and sent to the lab. Stool sample still needed. Patient oriented to the room. Bed left in low, locked position with call bell within reach.

## 2024-03-28 NOTE — Plan of Care (Signed)
 VSS. No c/o pain. Patient ambulating to bathroom with standby assist using walker. LBM 6/19; stool sample still needed. NS infusing at 100ml/hr. No acute events overnight.  Problem: Education: Goal: Knowledge of General Education information will improve Description: Including pain rating scale, medication(s)/side effects and non-pharmacologic comfort measures Outcome: Progressing   Problem: Clinical Measurements: Goal: Ability to maintain clinical measurements within normal limits will improve Outcome: Progressing Goal: Will remain free from infection Outcome: Progressing   Problem: Safety: Goal: Ability to remain free from injury will improve Outcome: Progressing   Problem: Education: Goal: Knowledge of disease and its progression will improve Outcome: Progressing   Problem: Clinical Measurements: Goal: Complications related to the disease process or treatment will be avoided or minimized Outcome: Progressing   Problem: Activity: Goal: Activity intolerance will improve Outcome: Progressing   Problem: Fluid Volume: Goal: Fluid volume balance will be maintained or improved Outcome: Progressing   Problem: Nutritional: Goal: Ability to make appropriate dietary choices will improve Outcome: Progressing   Problem: Respiratory: Goal: Respiratory symptoms related to disease process will be avoided Outcome: Progressing   Problem: Urinary Elimination: Goal: Progression of disease will be identified and treated Outcome: Progressing

## 2024-03-28 NOTE — Progress Notes (Signed)
 Progress Note   Patient: [name] AOZ:[id number] DOB: [birth year] DOA: 03/27/2024     1 DOS: the patient was seen and examined on 03/28/2024   Brief hospital course: [name] is a 78 y.o. female with medical history significant of HLD, Hx of PE,HTN,CAD non obstructive CAD, OSA not on CPAP, bladder cancer s/p right nephroureterectomy for cancer with last chemo treatment 2 mo ago ,anemia,GERD, CHF pef ef 63%,, hx of PE 2022, CKD IV with progression followed by nephrology most recent labs 6/10 noted cr of 3.3, patient presents to ED with diffuse abdominal pain associated with n/v/d x2-3 weeks.   In ED creatinine 7.87, CT abdomen/ pelvis showed no acute findings though suspicious nodal mets admitted to hospitalist service for further management of acute on CKD stage 4, Nephrology consulted.  Assessment and Plan: Acute on CKD stage IV: Due to poor po intake, severe diarrhea since 3 weeks. Continue gentle IV fluids. Nephrology evaluation appreciated, she is making urine, no urgent need for HD at this time. Continue to monitor daily renal functions, strict input and output. Avoid nephrotoxic drugs.  UTI- Change antibiotic to Rocephin . Urine cultures added to prior sample.  Diarrhea  Loose stools improved. CT abd negative, get stools sample for GI panel, c.diff.    Hx of Bladder cancer  s/p right nephroureterectomy for cancer with last chemo treatment 2 mo ago. Outpatient oncology follow up once kidney function improves.  Hypertension , borderline Continue carvedilol  and imdur   prn hydralazine     CAD,nonobstructive Continue on asa, statin, carvedilol ,imdur    OSA Not on cpap  prn at bedtime O2    HFpEF Compensated continue on carvedilol   EF 63% ,grade II diastolic dysfunction   GERD Continue Pepcid .   HLD  Continue on crestor .     Out of bed to chair. Incentive spirometry. Nursing supportive care. Fall, aspiration precautions. Diet:  Diet Orders (From  admission, onward)     Start     Ordered   03/27/24 2301  Diet Heart Room service appropriate? Yes; Fluid consistency: Thin  Diet effective now       Question Answer Comment  Room service appropriate? Yes   Fluid consistency: Thin      03/27/24 2302           DVT prophylaxis: heparin  injection 5,000 Units Start: 03/28/24 0600  Level of care: Telemetry   Code Status: Full Code  Subjective: Patient is seen and examined today morning. States she feels better. Making urine. Her diarrhea better.   Physical Exam: Vitals:   03/28/24 0016 03/28/24 0028 03/28/24 0436 03/28/24 0942  BP:  (!) 154/65 121/67 (!) 123/49  Pulse:  71 (!) 58   Resp:  18 18   Temp:  (!) 97.4 F (36.3 C) (!) 97.5 F (36.4 C)   TempSrc:  Oral Oral   SpO2:  100% 100% 97%  Weight: 76.8 kg     Height: 5' 2.99 (1.6 m)       General - Elderly Caucasian ill female, no apparent distress HEENT - PERRLA, EOMI, atraumatic head, non tender sinuses. Lung - Clear, no rales, rhonchi, wheezes. Heart - S1, S2 heard, no murmurs, rubs, no pedal edema. Abdomen - Soft, non tender, bowel sounds good Neuro - Alert, awake and oriented x 3, non focal exam. Skin - Warm and dry.  Data Reviewed:      Latest Ref Rng & Units 03/28/2024    5:29 AM 03/27/2024   11:45 PM 03/27/2024  4:44 PM  CBC  WBC 4.0 - 10.5 K/uL 9.6  11.4  12.9   Hemoglobin 12.0 - 15.0 g/dL 7.8  7.6  8.9   Hematocrit 36.0 - 46.0 % 26.3  24.1  28.9   Platelets 150 - 400 K/uL 250  274  303       Latest Ref Rng & Units 03/28/2024    5:29 AM 03/27/2024   11:45 PM 03/27/2024    4:44 PM  BMP  Glucose 70 - 99 mg/dL 85  97  045   BUN 8 - 23 mg/dL 76  79  79   Creatinine 0.44 - 1.00 mg/dL 4.09  8.11  9.14   Sodium 135 - 145 mmol/L 134  132  131   Potassium 3.5 - 5.1 mmol/L 3.9  3.8  3.9   Chloride 98 - 111 mmol/L 103  102  97   CO2 22 - 32 mmol/L 18  18  20    Calcium  8.9 - 10.3 mg/dL 7.9  7.8  8.4    CT ABDOMEN PELVIS WO CONTRAST Result Date:  03/27/2024 EXAM: CT ABDOMEN AND PELVIS WITHOUT CONTRAST 03/27/2024 09:18:00 PM TECHNIQUE: CT of the abdomen and pelvis was performed without the administration of intravenous contrast. Multiplanar reformatted images are provided for review. Automated exposure control, iterative reconstruction, and/or weight based adjustment of the mA/kV was utilized to reduce the radiation dose to as low as reasonably achievable. COMPARISON: None available. CLINICAL HISTORY: Abdominal pain, acute, nonlocalized. Pt c/o LLQ abdominal pain and n/v/d x3 weeks. Pain score 10/10. Pt has not taken anything for symptoms. Pt hasn't had any cancer treatments or interventions in several months. FINDINGS: LOWER CHEST: Tiny hiatal hernia. LIVER: The liver is unremarkable. GALLBLADDER AND BILE DUCTS: Gallbladder is unremarkable. No biliary ductal dilatation. SPLEEN: No acute abnormality. PANCREAS: No acute abnormality. ADRENAL GLANDS: No acute abnormality. KIDNEYS, URETERS AND BLADDER: Status post right nephroureterectomy. Left double-pigtail ureteral stent in satisfactory position. No hydronephrosis. No perinephric or periureteral stranding. Urinary bladder is unremarkable. GI AND BOWEL: Sigmoid diverticulosis, without evidence of diverticulitis. Normal appendix (image 53). There is no bowel obstruction. No bowel wall thickening. PERITONEUM AND RETROPERITONEUM: No ascites. No free air. VASCULATURE: Atherosclerotic calcifications of the abdominal aorta and branch vessels. Progressive left periaortic nodes measuring up to 10 mm short axis (image 42), previously 6 mm. LYMPH NODES: Progressive left periaortic nodes measuring up to 10 mm short axis (image 42), previously 6 mm. REPRODUCTIVE ORGANS: No acute abnormality. BONES AND SOFT TISSUES: No acute osseous abnormality. No focal soft tissue abnormality. IMPRESSION: 1. Left ureteral stent in satisfactory position. 2. Status post right nephroureterectomy. 3. Progressive left periaortic nodes  measuring up to 10 mm short axis, suspicious for nodal metastases. Electronically signed by: Zadie Herter MD 03/27/2024 09:27 PM EDT RP Workstation: [id number]    Family Communication: Discussed with patient, understand and agree. All questions answered.  Disposition: Status is: Inpatient Remains inpatient appropriate because: monitoring kidney function, IV hydration.  Planned Discharge Destination: Home with Home Health     Time spent: 39 minutes  Author: Aisha Hove, MD 03/28/2024 4:17 PM Secure chat 7am to 7pm For on call review [email or web address].

## 2024-03-28 NOTE — Progress Notes (Signed)
   03/28/24 1320  TOC Brief Assessment  Insurance and Status Reviewed  Patient has primary care physician Yes  Home environment has been reviewed Single family home w/ spouse  Prior level of function: Independent  Prior/Current Home Services No current home services  Social Drivers of Health Review SDOH reviewed no interventions necessary  Readmission risk has been reviewed Yes  Transition of care needs no transition of care needs at this time

## 2024-03-28 NOTE — Consult Note (Signed)
 [facility]  HISTORY AND PHYSICAL  [name] is an 78 y.o. female.    Chief Complaint:  Diarrhea  HPI: Pt is a 61F sig for CKD IV (baseline Cr 3.3), s/p R nephroureterectomy 2/2 ureteral cancer, h/o PE, and nonobstructive CAD who is now seen in consultattion at the request of Dr Butch Cashing for eval and recs re: AKI on CKD IV.    Pt reports diarrhea for the past 2-3 weeks.  Says she has a problem with IBS every year or so.  Went to PCP yesterday, noted to be severely dehydrated.  Cr 7.87.  Sent to the ED.  In this setting we are asked to see.  In ED, CT scan of abd/ pelvis showed patent L ureteral stent and para-aortic LAD (known).   Pt reports that she is feeling better now.  She is on IVFs and is making urine.  Cr is 7.04 today.    PMH: Past Medical History:  Diagnosis Date   Anemia associated with chronic renal failure    Arthritis    Blood dyscrasia 2008   hx of PE   CAD in native artery 10/2019   cardiologist-   dr Melven Stable. Paulita Boss;   a. 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   urologist--- dr dahlstedt/  oncologist--- dr Dirk Fredericks;   dx 09/ 2020 high grade urothelial  s/p laser ablation, ;  recurrent s/p BCG instillation,  completed chemo instilation 11/ 2022  and repeat chemo completed 05/ 2023   Chronic combined systolic and diastolic CHF (congestive heart failure) (HCC) 06/2019   followed by cardiology;   dx 09/ 2022 in setting of sepsis, pulm edema;   05/ 2022  ef 40-45% per echo,  recovered per cath 01/ 2021 ef 50%;  laste echo 08/ 2023  ef 60-65%   CKD (chronic kidney disease), stage III (HCC)    Depression    Dysrhythmia    bradycardia   GAD (generalized anxiety disorder)    GERD (gastroesophageal reflux disease)    History of bladder cancer 12/2006   followed by dr Joie Narrow   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   dx 04/ 2008  w/ poor function  hydronephrotic  right kidney;   02-06-2007  s/p right nephroureterectomy   History of kidney stones    History of pulmonary embolism 04/2022   in setting severe sepsis;  small RLL, treated w/ 3 months eliquis    Hyperlipidemia, mixed    Hypertension    Solitary kidney, acquired 02/06/2007   s/p  right nephroureterectomy for cancer   PSH: Past Surgical History:  Procedure Laterality Date   CATARACT EXTRACTION W/ INTRAOCULAR LENS IMPLANT Bilateral 2011   CYSTOSCOPY W/ RETROGRADES Left 01/30/2024   Procedure: CYSTOSCOPY, WITH RETROGRADE PYELOGRAM;  Surgeon: Melody Spurling., MD;  Location: [facility];  Service: Urology;  Laterality: Left;   CYSTOSCOPY W/ URETERAL STENT PLACEMENT Left 11/17/2019   Procedure: CYSTOSCOPY WITH STENT REPLACEMENT;  Surgeon: Trent Frizzle, MD;  Location: [facility];  Service: Urology;  Laterality: Left;   CYSTOSCOPY W/ URETERAL STENT PLACEMENT Left 03/23/2022   Procedure: CYSTOSCOPY WITH RETROGRADE PYELOGRAM, LEFT URETERAL STENT PLACEMENT;  Surgeon: Trent Frizzle, MD;  Location: [facility];  Service: Urology;  Laterality: Left;   CYSTOSCOPY W/ URETERAL STENT PLACEMENT Left 06/09/2022   Procedure: CYSTOSCOPY WITH LEFT STENT EXCHANGE;  Surgeon: Trent Frizzle, MD;  Location: [facility];  Service: Urology;  Laterality: Left;   CYSTOSCOPY W/ URETERAL STENT PLACEMENT Left 12/04/2022   Procedure: CYSTOSCOPY WITH STENT REPLACEMENT;  Surgeon: Trent Frizzle, MD;  Location: [facility];  Service: Urology;  Laterality: Left;   CYSTOSCOPY W/ URETERAL STENT REMOVAL Left 05/10/2020   Procedure: CYSTOSCOPY WITH STENT REMOVAL;  Surgeon: Trent Frizzle, MD;  Location: [facility]  Service: Urology;  Laterality: Left;   CYSTOSCOPY W/ URETERAL STENT REMOVAL Left 05/02/2021   Procedure: CYSTOSCOPY WITH STENT REMOVAL;  Surgeon: Trent Frizzle, MD;  Location: [facility];  Service: Urology;   Laterality: Left;   CYSTOSCOPY W/ URETERAL STENT REMOVAL Left 11/16/2022   Procedure: CYSTOSCOPY WITH STENT REMOVAL;  Surgeon: Trent Frizzle, MD;  Location: [facility];  Service: Urology;  Laterality: Left;   CYSTOSCOPY WITH RETROGRADE PYELOGRAM, URETEROSCOPY AND STENT PLACEMENT Left 06/27/2019   Procedure: CYSTOSCOPY WITH LEFT RETROGRADE PYELOGRAM, URETEROSCOPY, BIOPSY AND LEFT STENT PLACEMENT;  Surgeon: Homero Luster, MD;  Location: [facility];  Service: Urology;  Laterality: Left;   CYSTOSCOPY WITH RETROGRADE PYELOGRAM, URETEROSCOPY AND STENT PLACEMENT Left 08/18/2019   Procedure: CYSTOSCOPY, URETEROSCOPY AND STENT EXCHANGE;  Surgeon: Trent Frizzle, MD;  Location: [facility];  Service: Urology;  Laterality: Left;  90 MINS   CYSTOSCOPY WITH RETROGRADE PYELOGRAM, URETEROSCOPY AND STENT PLACEMENT Left 11/17/2019   Procedure: CYSTOSCOPY WITH RETROGRADE PYELOGRAM, URETEROSCOPY AND STENT PLACEMENT;  Surgeon: Trent Frizzle, MD;  Location: [facility];  Service: Urology;  Laterality: Left;  90 MINS   CYSTOSCOPY WITH RETROGRADE PYELOGRAM, URETEROSCOPY AND STENT PLACEMENT Left 05/10/2020   Procedure: CYSTOSCOPY WITH RETROGRADE PYELOGRAM, URETEROSCOPY AND STENT PLACEMENT WITH URETHRAL DIALATION AND BRUSH BIOPSY;  Surgeon: Trent Frizzle, MD;  Location: [facility];  Service: Urology;  Laterality: Left;  1 HR   CYSTOSCOPY WITH RETROGRADE PYELOGRAM, URETEROSCOPY AND STENT PLACEMENT Left 09/16/2020   Procedure: CYSTOSCOPY WITH RETROGRADE PYELOGRAM, URETEROSCOPY ,  LITHOPAXY, LEFT URETERAL BRUSHING, AND STENT REPLACEMENT;  Surgeon: Trent Frizzle, MD;  Location: [facility];  Service: Urology;  Laterality: Left;   CYSTOSCOPY WITH RETROGRADE PYELOGRAM, URETEROSCOPY AND STENT PLACEMENT Left 10/24/2021   Procedure: CYSTOSCOPY WITH RETROGRADE PYELOGRAM, URETEROSCOPY, POSSIBLE URETERAL AND RENAL BIOPSIES AND STENT PLACEMENT;  Surgeon: Trent Frizzle, MD;  Location: [facility];  Service: Urology;  Laterality: Left;  1 HR   CYSTOSCOPY WITH RETROGRADE PYELOGRAM, URETEROSCOPY AND STENT PLACEMENT Left 03/09/2022   Procedure: CYSTOSCOPY WITH ANTEGRADE PYELOGRAM,  STENT REMOVAL;  Surgeon: Trent Frizzle, MD;  Location: [facility];  Service: Urology;  Laterality: Left;   CYSTOSCOPY WITH RETROGRADE PYELOGRAM, URETEROSCOPY AND STENT PLACEMENT  01/09/2007   @[facility]  by dr Joie Narrow;    bx's/ washing  bladder , right ureter   CYSTOSCOPY WITH RETROGRADE PYELOGRAM, URETEROSCOPY AND STENT PLACEMENT Left 11/16/2022   Procedure: CYSTOSCOPY WITH RETROGRADE PYELOGRAM, URETEROSCOPY AND STENT REPLACEMENT;  Surgeon: Trent Frizzle, MD;  Location: [facility];  Service: Urology;  Laterality: Left;  90 MINS   CYSTOSCOPY WITH RETROGRADE PYELOGRAM, URETEROSCOPY AND STENT PLACEMENT Left 07/06/2023   Procedure: CYSTOSCOPY WITH LEFT RETROGRADE PYELOGRAM, URETEROSCOPY, LASER ABLATION OF TUMOR AND STENT EXCHANGE;  Surgeon: Melody Spurling., MD;  Location: [facility];  Service: Urology;  Laterality: Left;  60 MINUTES NEEDED FOR CASE   CYSTOSCOPY/URETEROSCOPY/HOLMIUM LASER Left 12/04/2022   Procedure: CYSTOSCOPY/URETEROSCOPY/  HOLMIUM LASER OF RENAL PELVIC LESIONS;  Surgeon: Trent Frizzle, MD;  Location: [facility];  Service: Urology;  Laterality: Left;   CYSTOSCOPY/URETEROSCOPY/HOLMIUM LASER/STENT PLACEMENT Left 05/02/2021   Procedure: CYSTOSCOPY/URETEROSCOPY WITH BRUSH BIOPSY/ RETROGRADE PYELOGRAM/ HOLMIUM LASER/STENT REPLACEMENT;  Surgeon: Trent Frizzle, MD;  Location: [facility];  Service: Urology;  Laterality: Left;   CYSTOSCOPY/URETEROSCOPY/HOLMIUM LASER/STENT PLACEMENT Left 01/30/2024   Procedure: CYSTOSCOPY/DIAGNOSTIC URETEROSCOPY/ RETROGRADE/STENT EXCHANGE;  Surgeon: Melody Spurling., MD;  Location[facility];  Service: Urology;  Laterality: Left;   HOLMIUM LASER  APPLICATION Left 09/16/2020   Procedure: HOLMIUM LASER APPLICATION;  Surgeon: Trent Frizzle, MD;  Location: [facility];  Service: Urology;  Laterality: Left;   HOLMIUM LASER APPLICATION Left 10/24/2021   Procedure: HOLMIUM LASER APPLICATION OF TUMORS;  Surgeon: Trent Frizzle, MD;  Location: [facility];  Service: Urology;  Laterality: Left;   HOLMIUM LASER APPLICATION Left 11/16/2022   Procedure: HOLMIUM LASER OF TUMORS;  Surgeon: Trent Frizzle, MD;  Location: [facility];  Service: Urology;  Laterality: Left;   IR NEPHROSTOMY PLACEMENT LEFT  07/06/2021   IR NEPHROSTOMY PLACEMENT LEFT  02/06/2022   NEPHROSTOMY TUBE REMOVAL Left 03/23/2022   Procedure: NEPHROSTOMY TUBE REMOVAL;  Surgeon: Trent Frizzle, MD;  Location: [facility];  Service: Urology;  Laterality: Left;   NEPHROURETERECTOMY Right 02/06/2007   @[facility]  by dr Joie Narrow;   Laparoscopic   RIGHT/LEFT HEART CATH AND CORONARY ANGIOGRAPHY N/A 11/06/2019   Procedure: RIGHT/LEFT HEART CATH AND CORONARY ANGIOGRAPHY;  Surgeon: Arty Binning, MD;  Location: [facility];  Service: Cardiovascular;  Laterality: N/A;   THULIUM LASER TURP (TRANSURETHRAL RESECTION OF PROSTATE) Left 08/18/2019   Procedure: THULIUM LASER ABLATION OF URETERAL TUMOR;  Surgeon: Trent Frizzle, MD;  Location: [facility];  Service: Urology;  Laterality: Left;   THULIUM LASER TURP (TRANSURETHRAL RESECTION OF PROSTATE) Left 11/17/2019   Procedure: THULIUM LASER of URETERAL CANCER;  Surgeon: Trent Frizzle, MD;  Location: [facility];  Service: Urology;  Laterality: Left;   TRANSURETHRAL RESECTION OF BLADDER TUMOR  12/12/2006   @[facility]  by dr Milon Aloe   VAGINAL HYSTERECTOMY  1980     Past Medical History:  Diagnosis Date   Anemia associated with chronic renal failure    Arthritis    Blood dyscrasia 2008   hx of PE   CAD in native artery 10/2019   cardiologist-   dr Melven Stable.  Paulita Boss;   a. 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   urologist--- dr dahlstedt/  oncologist--- dr Dirk Fredericks;   dx 09/ 2020 high grade urothelial  s/p laser ablation, ;  recurrent s/p BCG instillation,  completed chemo instilation 11/ 2022  and repeat chemo completed 05/ 2023   Chronic combined systolic and diastolic CHF (congestive heart failure) (HCC) 06/2019   followed by cardiology;   dx 09/ 2022 in setting of sepsis, pulm edema;   05/ 2022  ef 40-45% per echo,  recovered per cath 01/ 2021 ef 50%;  laste echo 08/ 2023  ef 60-65%   CKD (chronic kidney disease), stage III (HCC)    Depression    Dysrhythmia    bradycardia   GAD (generalized anxiety disorder)    GERD (gastroesophageal reflux disease)    History of bladder cancer 12/2006   followed by dr Joie Narrow   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   dx 04/ 2008  w/ poor function hydronephrotic  right kidney;   02-06-2007  s/p right nephroureterectomy  History of kidney stones    History of pulmonary embolism 04/2022   in setting severe sepsis;  small RLL, treated w/ 3 months eliquis    Hyperlipidemia, mixed    Hypertension    Solitary kidney, acquired 02/06/2007   s/p  right nephroureterectomy for cancer    Medications:  Scheduled:  carvedilol   6.25 mg Oral BID WC   famotidine   20 mg Oral Daily   heparin   5,000 Units Subcutaneous Q8H   isosorbide  mononitrate  15 mg Oral Daily   rosuvastatin   10 mg Oral Daily   sertraline   50 mg Oral q morning    Medications Prior to Admission  Medication Sig Dispense Refill   acetaminophen  (TYLENOL ) 500 MG tablet Take 500-1,000 mg by mouth every 6 (six) hours as needed for mild pain.     aspirin  EC 81 MG tablet Take 1 tablet (81 mg total) by mouth daily. Swallow whole. 90 tablet 3   carvedilol  (COREG ) 6.25 MG tablet TAKE 1 TABLET(6.25 MG) BY MOUTH TWICE DAILY WITH A MEAL 180 tablet 3    cholecalciferol  (VITAMIN D3) 25 MCG (1000 UNIT) tablet Take 1,000 Units by mouth every morning.     Famotidine  (HEARTBURN RELIEF PO) Take 1 tablet by mouth in the morning and at bedtime.     Ferrous Sulfate (IRON PO) Take 1 tablet by mouth in the morning.     icosapent  Ethyl (VASCEPA ) 1 g capsule TAKE 1 CAPSULE(1 GRAM) BY MOUTH TWICE DAILY (Patient taking differently: Take 2 g by mouth 2 (two) times daily.) 60 capsule 11   isosorbide  mononitrate (IMDUR ) 30 MG 24 hr tablet TAKE 1/2 TABLET(15 MG) BY MOUTH DAILY 45 tablet 3   rosuvastatin  (CRESTOR ) 40 MG tablet Take 1 tablet (40 mg total) by mouth daily. 90 tablet 0   sertraline  (ZOLOFT ) 50 MG tablet Take 50 mg by mouth every morning.     traMADol  (ULTRAM ) 50 MG tablet Take 1 tablet (50 mg total) by mouth every 6 (six) hours as needed for moderate pain (pain score 4-6) (post-operatively). 10 tablet 0   traZODone  (DESYREL ) 150 MG tablet Take 150 mg by mouth at bedtime.      ALLERGIES:   Allergies  Allergen Reactions   Erythromycin Diarrhea and Nausea And Vomiting   Lotensin [Benazepril Hcl] Cough   Macrodantin [Nitrofurantoin] Diarrhea    And night sweats    FAM HX: Family History  Problem Relation Age of Onset   Hypertension Mother    Diabetes Mellitus II Sister    Hypertension Sister    Hypertension Brother     Social History:   reports that she quit smoking about 20 years ago. Her smoking use included cigarettes. She started smoking about 58 years ago. She has a 19 pack-year smoking history. She has never used smokeless tobacco. She reports that she does not currently use alcohol. She reports that she does not use drugs.  ROS: ROS: all other systems reviewed and are negative except as per HPI  Blood pressure (!) 123/49, pulse (!) 58, temperature (!) 97.5 F (36.4 C), temperature source Oral, resp. rate 18, height 5' 2.99 (1.6 m), weight 76.8 kg, SpO2 97%. PHYSICAL EXAM: Physical Exam GEN lying flat in bed, NAD HEENT EOMI  PERRL, dry MM NECK flat neck veins PULM clear CV RRR ABD soft EXT no LE edema NEURO AAO x 3 nonfocal SKIN poor turgor  Results for orders placed or performed during the hospital encounter of 03/27/24 (from the past 48 hours)  Lipase,  blood     Status: None   Collection Time: 03/27/24  4:44 PM  Result Value Ref Range   Lipase 38 11 - 51 U/L    Comment: Performed at [facility], [address] [place], [place] [address]  Comprehensive metabolic panel     Status: Abnormal   Collection Time: 03/27/24  4:44 PM  Result Value Ref Range   Sodium 131 (L) 135 - 145 mmol/L   Potassium 3.9 3.5 - 5.1 mmol/L   Chloride 97 (L) 98 - 111 mmol/L   CO2 20 (L) 22 - 32 mmol/L   Glucose, Bld 114 (H) 70 - 99 mg/dL    Comment: Glucose reference range applies only to samples taken after fasting for at least 8 hours.   BUN 79 (H) 8 - 23 mg/dL   Creatinine, Ser 4.09 (H) 0.44 - 1.00 mg/dL   Calcium  8.4 (L) 8.9 - 10.3 mg/dL   Total Protein 8.3 (H) 6.5 - 8.1 g/dL   Albumin  2.9 (L) 3.5 - 5.0 g/dL   AST 50 (H) 15 - 41 U/L   ALT 41 0 - 44 U/L   Alkaline Phosphatase 117 38 - 126 U/L   Total Bilirubin 0.6 0.0 - 1.2 mg/dL   GFR, Estimated 5 (L) >60 mL/min    Comment: (NOTE) Calculated using the CKD-EPI Creatinine Equation (2021)    Anion gap 14 5 - 15    Comment: Performed at [facility], [address] [place], [place] [address]  CBC     Status: Abnormal   Collection Time: 03/27/24  4:44 PM  Result Value Ref Range   WBC 12.9 (H) 4.0 - 10.5 K/uL   RBC 3.19 (L) 3.87 - 5.11 MIL/uL   Hemoglobin 8.9 (L) 12.0 - 15.0 g/dL   HCT 47.8 (L) 29.5 - 62.1 %   MCV 90.6 80.0 - 100.0 fL   MCH 27.9 26.0 - 34.0 pg   MCHC 30.8 30.0 - 36.0 g/dL   RDW 30.8 65.7 - 84.6 %   Platelets 303 150 - 400 K/uL   nRBC 0.0 0.0 - 0.2 %    Comment: Performed at [facility], [address] [place], [place] [address]  CBC     Status: Abnormal   Collection Time: 03/27/24  11:45 PM  Result Value Ref Range   WBC 11.4 (H) 4.0 - 10.5 K/uL   RBC 2.70 (L) 3.87 - 5.11 MIL/uL   Hemoglobin 7.6 (L) 12.0 - 15.0 g/dL   HCT 28.4 (L) 13.2 - 44.0 %   MCV 89.3 80.0 - 100.0 fL   MCH 28.1 26.0 - 34.0 pg   MCHC 31.5 30.0 - 36.0 g/dL   RDW 10.2 72.5 - 36.6 %   Platelets 274 150 - 400 K/uL   nRBC 0.0 0.0 - 0.2 %    Comment: Performed at [facility], [address] [place], [place] [address]  Basic metabolic panel     Status: Abnormal   Collection Time: 03/27/24 11:45 PM  Result Value Ref Range   Sodium 132 (L) 135 - 145 mmol/L   Potassium 3.8 3.5 - 5.1 mmol/L   Chloride 102 98 - 111 mmol/L   CO2 18 (L) 22 - 32 mmol/L   Glucose, Bld 97 70 - 99 mg/dL    Comment: Glucose reference range applies only to samples taken after fasting for at least 8 hours.   BUN 79 (H) 8 - 23 mg/dL   Creatinine,  Ser 7.13 (H) 0.44 - 1.00 mg/dL   Calcium  7.8 (L) 8.9 - 10.3 mg/dL   GFR, Estimated 6 (L) >60 mL/min    Comment: (NOTE) Calculated using the CKD-EPI Creatinine Equation (2021)    Anion gap 12 5 - 15    Comment: Performed at [facility], [address] [place], [place] [address]  Lactic acid, plasma     Status: None   Collection Time: 03/27/24 11:45 PM  Result Value Ref Range   Lactic Acid, Venous 0.6 0.5 - 1.9 mmol/L    Comment: Performed at [facility], [address] [place], [place] [address]  Procalcitonin     Status: None   Collection Time: 03/27/24 11:45 PM  Result Value Ref Range   Procalcitonin 0.18 ng/mL    Comment:        Interpretation: PCT (Procalcitonin) <= 0.5 ng/mL: Systemic infection (sepsis) is not likely. Local bacterial infection is possible. (NOTE)       Sepsis PCT Algorithm           Lower Respiratory Tract                                      Infection PCT Algorithm    ----------------------------     ----------------------------         PCT < 0.25 ng/mL                PCT < 0.10 ng/mL           Strongly encourage             Strongly discourage   discontinuation of antibiotics    initiation of antibiotics    ----------------------------     -----------------------------       PCT 0.25 - 0.50 ng/mL            PCT 0.10 - 0.25 ng/mL               OR       >80% decrease in PCT            Discourage initiation of                                            antibiotics      Encourage discontinuation           of antibiotics    ----------------------------     -----------------------------         PCT >= 0.50 ng/mL              PCT 0.26 - 0.50 ng/mL               AND        <80% decrease in PCT             Encourage initiation of                                             antibiotics       Encourage continuation           of antibiotics    ----------------------------     -----------------------------  PCT >= 0.50 ng/mL                  PCT > 0.50 ng/mL               AND         increase in PCT                  Strongly encourage                                      initiation of antibiotics    Strongly encourage escalation           of antibiotics                                     -----------------------------                                           PCT <= 0.25 ng/mL                                                 OR                                        > 80% decrease in PCT                                      Discontinue / Do not initiate                                             antibiotics  Performed at [facility], [address] [place], [place] [address]   Blood gas, venous     Status: Abnormal   Collection Time: 03/27/24 11:53 PM  Result Value Ref Range   pH, Ven 7.31 7.25 - 7.43   pCO2, Ven 38 (L) 44 - 60 mmHg   pO2, Ven 37 32 - 45 mmHg   Bicarbonate 19.1 (L) 20.0 - 28.0 mmol/L   Acid-base deficit 6.6 (H) 0.0 - 2.0 mmol/L   O2 Saturation 61.9 %   Patient temperature 37.0     Comment: Performed at [facility], [address] [place], [place] [address]  Urinalysis, Routine w reflex microscopic -Urine, Clean Catch     Status: Abnormal   Collection Time: 03/28/24  1:58 AM  Result Value Ref Range   Color, Urine YELLOW YELLOW   APPearance CLOUDY (A) CLEAR   Specific Gravity, Urine 1.008 1.005 - 1.030   pH 5.0 5.0 - 8.0   Glucose, UA NEGATIVE NEGATIVE mg/dL   Hgb urine dipstick MODERATE (A) NEGATIVE   Bilirubin Urine NEGATIVE NEGATIVE   Ketones, ur NEGATIVE NEGATIVE mg/dL   Protein, ur 30 (A) NEGATIVE mg/dL   Nitrite NEGATIVE  NEGATIVE   Leukocytes,Ua LARGE (A) NEGATIVE   RBC / HPF 11-20 0 - 5 RBC/hpf   WBC, UA >50 0 - 5 WBC/hpf   Bacteria, UA MANY (A) NONE SEEN   Squamous Epithelial / HPF 0-5 0 - 5 /HPF   WBC Clumps PRESENT     Comment: Performed at [facility], [address] [place], [place] [address]  Comprehensive metabolic panel     Status: Abnormal   Collection Time: 03/28/24  5:29 AM  Result Value Ref Range   Sodium 134 (L) 135 - 145 mmol/L   Potassium 3.9 3.5 - 5.1 mmol/L   Chloride 103 98 - 111 mmol/L   CO2 18 (L) 22 - 32 mmol/L   Glucose, Bld 85 70 - 99 mg/dL    Comment: Glucose reference range applies only to samples taken after fasting for at least 8 hours.   BUN 76 (H) 8 - 23 mg/dL   Creatinine, Ser 6.04 (H) 0.44 - 1.00 mg/dL   Calcium  7.9 (L) 8.9 - 10.3 mg/dL   Total Protein 6.9 6.5 - 8.1 g/dL   Albumin  2.5 (L) 3.5 - 5.0 g/dL   AST 39 15 - 41 U/L   ALT 34 0 - 44 U/L   Alkaline Phosphatase 93 38 - 126 U/L   Total Bilirubin 0.5 0.0 - 1.2 mg/dL   GFR, Estimated 6 (L) >60 mL/min    Comment: (NOTE) Calculated using the CKD-EPI Creatinine Equation (2021)    Anion gap 13 5 - 15    Comment: Performed at [facility], [address] [place], [place] [address]  CBC     Status: Abnormal   Collection Time: 03/28/24  5:29 AM  Result Value Ref Range   WBC 9.6 4.0 - 10.5 K/uL   RBC 2.77 (L) 3.87 - 5.11 MIL/uL   Hemoglobin 7.8  (L) 12.0 - 15.0 g/dL   HCT 11.9 (L) 14.7 - 82.9 %   MCV 94.9 80.0 - 100.0 fL   MCH 28.2 26.0 - 34.0 pg   MCHC 29.7 (L) 30.0 - 36.0 g/dL   RDW 56.2 13.0 - 86.5 %   Platelets 250 150 - 400 K/uL   nRBC 0.0 0.0 - 0.2 %    Comment: Performed at [facility], [address], [place], [place] [address]  Lactic acid, plasma     Status: None   Collection Time: 03/28/24  5:29 AM  Result Value Ref Range   Lactic Acid, Venous 0.8 0.5 - 1.9 mmol/L    Comment: Performed at [facility], [address], [place], [place] [address]    CT ABDOMEN PELVIS WO CONTRAST Result Date: 03/27/2024 EXAM: CT ABDOMEN AND PELVIS WITHOUT CONTRAST 03/27/2024 09:18:00 PM TECHNIQUE: CT of the abdomen and pelvis was performed without the administration of intravenous contrast. Multiplanar reformatted images are provided for review. Automated exposure control, iterative reconstruction, and/or weight based adjustment of the mA/kV was utilized to reduce the radiation dose to as low as reasonably achievable. COMPARISON: None available. CLINICAL HISTORY: Abdominal pain, acute, nonlocalized. Pt c/o LLQ abdominal pain and n/v/d x3 weeks. Pain score 10/10. Pt has not taken anything for symptoms. Pt hasn't had any cancer treatments or interventions in several months. FINDINGS: LOWER CHEST: Tiny hiatal hernia. LIVER: The liver is unremarkable. GALLBLADDER AND BILE DUCTS: Gallbladder is unremarkable. No biliary ductal dilatation. SPLEEN: No acute abnormality. PANCREAS: No acute abnormality. ADRENAL GLANDS: No acute abnormality. KIDNEYS, URETERS AND BLADDER: Status post  right nephroureterectomy. Left double-pigtail ureteral stent in satisfactory position. No hydronephrosis. No perinephric or periureteral stranding. Urinary bladder is unremarkable. GI AND BOWEL: Sigmoid diverticulosis, without evidence of diverticulitis. Normal appendix (image 53). There is no bowel obstruction. No bowel wall thickening.  PERITONEUM AND RETROPERITONEUM: No ascites. No free air. VASCULATURE: Atherosclerotic calcifications of the abdominal aorta and branch vessels. Progressive left periaortic nodes measuring up to 10 mm short axis (image 42), previously 6 mm. LYMPH NODES: Progressive left periaortic nodes measuring up to 10 mm short axis (image 42), previously 6 mm. REPRODUCTIVE ORGANS: No acute abnormality. BONES AND SOFT TISSUES: No acute osseous abnormality. No focal soft tissue abnormality. IMPRESSION: 1. Left ureteral stent in satisfactory position. 2. Status post right nephroureterectomy. 3. Progressive left periaortic nodes measuring up to 10 mm short axis, suspicious for nodal metastases. Electronically signed by: Zadie Herter MD 03/27/2024 09:27 PM EDT RP Workstation: [id number]    Assessment/Plan  AKI on CKD IV: 2/2 hypovolemia and pre-renal insults.  Making urine.  Not uremic  - agree with IVFs  - no need for RRT at present  - hopefully will go back to baseline  2.  Hyponatremia:  - improving  3.  Metabolic acidosis:  - mild, monitor  4.  Anemia  - not an ESA candidate d/t malignancy  5.  Diarrhea:  - on meropenem . Gi path panel pending  6.  Dispo: admitted  Leandra Pro 03/28/2024, 1:48 PM

## 2024-03-29 DIAGNOSIS — N184 Chronic kidney disease, stage 4 (severe): Secondary | ICD-10-CM | POA: Diagnosis not present

## 2024-03-29 DIAGNOSIS — C679 Malignant neoplasm of bladder, unspecified: Secondary | ICD-10-CM | POA: Diagnosis not present

## 2024-03-29 DIAGNOSIS — D649 Anemia, unspecified: Secondary | ICD-10-CM

## 2024-03-29 DIAGNOSIS — N179 Acute kidney failure, unspecified: Secondary | ICD-10-CM | POA: Diagnosis not present

## 2024-03-29 DIAGNOSIS — E871 Hypo-osmolality and hyponatremia: Secondary | ICD-10-CM | POA: Diagnosis not present

## 2024-03-29 LAB — CBC
HCT: 24.1 % — ABNORMAL LOW (ref 36.0–46.0)
Hemoglobin: 7.5 g/dL — ABNORMAL LOW (ref 12.0–15.0)
MCH: 28.1 pg (ref 26.0–34.0)
MCHC: 31.1 g/dL (ref 30.0–36.0)
MCV: 90.3 fL (ref 80.0–100.0)
Platelets: 237 10*3/uL (ref 150–400)
RBC: 2.67 MIL/uL — ABNORMAL LOW (ref 3.87–5.11)
RDW: 15.7 % — ABNORMAL HIGH (ref 11.5–15.5)
WBC: 6.4 10*3/uL (ref 4.0–10.5)
nRBC: 0 % (ref 0.0–0.2)

## 2024-03-29 LAB — BASIC METABOLIC PANEL WITH GFR
Anion gap: 14 (ref 5–15)
BUN: 73 mg/dL — ABNORMAL HIGH (ref 8–23)
CO2: 16 mmol/L — ABNORMAL LOW (ref 22–32)
Calcium: 8 mg/dL — ABNORMAL LOW (ref 8.9–10.3)
Chloride: 101 mmol/L (ref 98–111)
Creatinine, Ser: 6.36 mg/dL — ABNORMAL HIGH (ref 0.44–1.00)
GFR, Estimated: 6 mL/min — ABNORMAL LOW (ref >60)
Glucose, Bld: 110 mg/dL — ABNORMAL HIGH (ref 70–99)
Potassium: 4 mmol/L (ref 3.5–5.1)
Sodium: 131 mmol/L — ABNORMAL LOW (ref 135–145)

## 2024-03-29 LAB — FOLATE: Folate: 5.7 ng/mL — ABNORMAL LOW (ref >5.9)

## 2024-03-29 LAB — VITAMIN B12: Vitamin B-12: 470 pg/mL (ref 180–914)

## 2024-03-29 LAB — IRON AND TIBC
Iron: 28 ug/dL (ref 28–170)
Saturation Ratios: 15 % (ref 10.4–31.8)
TIBC: 186 ug/dL — ABNORMAL LOW (ref 250–450)
UIBC: 158 ug/dL

## 2024-03-29 LAB — FERRITIN: Ferritin: 574 ng/mL — ABNORMAL HIGH (ref 11–307)

## 2024-03-29 MED ORDER — SODIUM CHLORIDE 0.9 % IV SOLN: INTRAVENOUS | Status: AC

## 2024-03-29 MED ORDER — FERROUS SULFATE 325 (65 FE) MG PO TABS
325.0000 mg | ORAL_TABLET | Freq: Every day | ORAL | Status: DC
  Administered 2024-03-29 – 2024-04-03 (×6): 325 mg via ORAL
  Filled 2024-03-29 (×6): qty 1

## 2024-03-29 NOTE — Progress Notes (Signed)
 [facility] Progress Note   Assessment/ Plan:    AKI on CKD IV: 2/2 hypovolemia and pre-renal insults.  Making urine.  Not uremic             - continue with IVFs- one more day             - no need for RRT at present             - hopefully will go back to baseline   2.  Hyponatremia:             - improving   3.  Metabolic acidosis:             - mild, monitor   4.  Anemia             - not an ESA candidate d/t malignancy   5.  Diarrhea:             - improving Gi path panel pending   6.  Dispo: admitted  Subjective:    Seen in room.  Doing better. Feeling better.     Objective:   BP (!) 130/59 (BP Location: Left Arm)   Pulse 81   Temp 97.9 F (36.6 C) (Oral)   Resp 18   Ht 5' 2.99 (1.6 m)   Wt 76.8 kg   SpO2 95%   BMI 30.00 kg/m   Intake/Output Summary (Last 24 hours) at 03/29/2024 1543 Last data filed at 03/29/2024 0945 Gross per 24 hour  Intake 3680.15 ml  Output 600 ml  Net 3080.15 ml   Weight change:   Physical Exam: GEN lying flat in bed, NAD HEENT EOMI PERRL, dry MM NECK flat neck veins PULM clear CV RRR ABD soft EXT no LE edema NEURO AAO x 3 nonfocal SKIN poor turgor  Imaging: CT ABDOMEN PELVIS WO CONTRAST Result Date: 03/27/2024 EXAM: CT ABDOMEN AND PELVIS WITHOUT CONTRAST 03/27/2024 09:18:00 PM TECHNIQUE: CT of the abdomen and pelvis was performed without the administration of intravenous contrast. Multiplanar reformatted images are provided for review. Automated exposure control, iterative reconstruction, and/or weight based adjustment of the mA/kV was utilized to reduce the radiation dose to as low as reasonably achievable. COMPARISON: None available. CLINICAL HISTORY: Abdominal pain, acute, nonlocalized. Pt c/o LLQ abdominal pain and n/v/d x3 weeks. Pain score 10/10. Pt has not taken anything for symptoms. Pt hasn't had any cancer treatments or interventions in several months. FINDINGS: LOWER CHEST: Tiny hiatal hernia. LIVER:  The liver is unremarkable. GALLBLADDER AND BILE DUCTS: Gallbladder is unremarkable. No biliary ductal dilatation. SPLEEN: No acute abnormality. PANCREAS: No acute abnormality. ADRENAL GLANDS: No acute abnormality. KIDNEYS, URETERS AND BLADDER: Status post right nephroureterectomy. Left double-pigtail ureteral stent in satisfactory position. No hydronephrosis. No perinephric or periureteral stranding. Urinary bladder is unremarkable. GI AND BOWEL: Sigmoid diverticulosis, without evidence of diverticulitis. Normal appendix (image 53). There is no bowel obstruction. No bowel wall thickening. PERITONEUM AND RETROPERITONEUM: No ascites. No free air. VASCULATURE: Atherosclerotic calcifications of the abdominal aorta and branch vessels. Progressive left periaortic nodes measuring up to 10 mm short axis (image 42), previously 6 mm. LYMPH NODES: Progressive left periaortic nodes measuring up to 10 mm short axis (image 42), previously 6 mm. REPRODUCTIVE ORGANS: No acute abnormality. BONES AND SOFT TISSUES: No acute osseous abnormality. No focal soft tissue abnormality. IMPRESSION: 1. Left ureteral stent in satisfactory position. 2. Status post right nephroureterectomy. 3. Progressive left periaortic nodes measuring up to 10 mm short axis, suspicious  for nodal metastases. Electronically signed by: Pinkie Pebbles MD 03/27/2024 09:27 PM EDT RP Workstation: [id number]    Labs: BMET Recent Labs  Lab 03/27/24 1644 03/27/24 2345 03/28/24 0529 03/29/24 0827  NA 131* 132* 134* 131*  K 3.9 3.8 3.9 4.0  CL 97* 102 103 101  CO2 20* 18* 18* 16*  GLUCOSE 114* 97 85 110*  BUN 79* 79* 76* 73*  CREATININE 7.87* 7.13* 7.04* 6.36*  CALCIUM  8.4* 7.8* 7.9* 8.0*   CBC Recent Labs  Lab 03/27/24 1644 03/27/24 2345 03/28/24 0529 03/29/24 0827  WBC 12.9* 11.4* 9.6 6.4  HGB 8.9* 7.6* 7.8* 7.5*  HCT 28.9* 24.1* 26.3* 24.1*  MCV 90.6 89.3 94.9 90.3  PLT 303 274 250 237    Medications:     carvedilol   6.25 mg Oral BID  WC   famotidine   20 mg Oral Daily   ferrous sulfate   325 mg Oral Daily   heparin   5,000 Units Subcutaneous Q8H   isosorbide  mononitrate  15 mg Oral Daily   rosuvastatin   10 mg Oral Daily   sertraline   50 mg Oral q morning    Almarie Bonine, MD 03/29/2024, 3:43 PM

## 2024-03-29 NOTE — Progress Notes (Addendum)
 Progress Note   Patient: [name] FMW:[id number] DOB: 19-Jun-1946 DOA: 03/27/2024     2 DOS: the patient was seen and examined on 03/29/2024   Brief hospital course: [name] is a 78 y.o. female with medical history significant of HLD, Hx of PE,HTN,CAD non obstructive CAD, OSA not on CPAP, bladder cancer s/p right nephroureterectomy for cancer with last chemo treatment 2 mo ago ,anemia,GERD, CHF pef ef 63%,, hx of PE 2022, CKD IV with progression followed by nephrology most recent labs 6/10 noted cr of 3.3, patient presents to ED with diffuse abdominal pain associated with n/v/d x2-3 weeks.   In ED creatinine 7.87, CT abdomen/ pelvis showed no acute findings though suspicious nodal mets admitted to hospitalist service for further management of acute on CKD stage 4, Nephrology consulted, advised gentle IV fluids, no HD need.  Assessment and Plan: Acute on CKD stage IV: Due to poor po intake, severe diarrhea since 3 weeks. She is eating fair, diarrhea improved. Nephrology evaluation appreciated, she is making urine, no urgent need for HD at this time. Kidney function mild improvement with fluids. Hold fluids for now as she is short of breath. Continue to monitor daily renal functions, strict input and output. Avoid nephrotoxic drugs.  UTI- Changed meropenem  to Rocephin  therapy. Urine cultures added to prior sample pending.  Hyponatremia- Due to hypovolemia, dehydration. Continue to trend Sodium.  Chronic anemia- Hb ranging from 7-11 Check iron panel, b12, folate, stool occult. She denies active bleeding.  Diarrhea  Loose stools improved. CT abd negative, get stool for GI panel, c.diff if she is able to provide sample.   Hx of Bladder cancer  s/p right nephroureterectomy for cancer with last chemo treatment 2 mo ago. Outpatient oncology follow up once kidney function improves.  Hypertension, borderline Continue carvedilol  and imdur   prn hydralazine      CAD,nonobstructive Continue on asa, statin, carvedilol ,imdur    OSA Not on cpap  prn at bedtime O2    HFpEF Compensated continue on carvedilol   EF 63% ,grade II diastolic dysfunction   GERD Continue Pepcid .   HLD  Continue on crestor .  Out of bed to chair. Incentive spirometry. Nursing supportive care. Fall, aspiration precautions. Diet:  Diet Orders (From admission, onward)     Start     Ordered   03/27/24 2301  Diet Heart Room service appropriate? Yes; Fluid consistency: Thin  Diet effective now       Question Answer Comment  Room service appropriate? Yes   Fluid consistency: Thin      03/27/24 2302           DVT prophylaxis: heparin  injection 5,000 Units Start: 03/28/24 0600  Level of care: Telemetry   Code Status: Full Code  Subjective: Patient is seen and examined today morning. RN noted that she is short of breath with walking. She feels no different, on and off dyspnea at baseline per her. Encouraged out of bed, incentive spirometry.  Physical Exam: Vitals:   03/28/24 1644 03/28/24 1954 03/29/24 0441 03/29/24 0843  BP: (!) 115/53 (!) 131/58 134/68 (!) 130/59  Pulse: 61 (!) 57 73 81  Resp:  18 18 18   Temp:  98.9 F (37.2 C) 100.1 F (37.8 C) 97.9 F (36.6 C)  TempSrc:  Oral Oral Oral  SpO2: 95% 96% 94% 95%  Weight:      Height:        General - Elderly Caucasian ill female, mild respiratory distress HEENT - PERRLA, EOMI, atraumatic head, non  tender sinuses. Lung - Clear, basal rales, rhonchi, no wheezes. Heart - S1, S2 heard, no murmurs, rubs, no pedal edema. Abdomen - Soft, non tender, bowel sounds good Neuro - Alert, awake and oriented x 3, non focal exam. Skin - Warm and dry.  Data Reviewed:      Latest Ref Rng & Units 03/29/2024    8:27 AM 03/28/2024    5:29 AM 03/27/2024   11:45 PM  CBC  WBC 4.0 - 10.5 K/uL 6.4  9.6  11.4   Hemoglobin 12.0 - 15.0 g/dL 7.5  7.8  7.6   Hematocrit 36.0 - 46.0 % 24.1  26.3  24.1   Platelets 150 - 400  K/uL 237  250  274       Latest Ref Rng & Units 03/29/2024    8:27 AM 03/28/2024    5:29 AM 03/27/2024   11:45 PM  BMP  Glucose 70 - 99 mg/dL 889  85  97   BUN 8 - 23 mg/dL 73  76  79   Creatinine 0.44 - 1.00 mg/dL 3.63  2.95  2.86   Sodium 135 - 145 mmol/L 131  134  132   Potassium 3.5 - 5.1 mmol/L 4.0  3.9  3.8   Chloride 98 - 111 mmol/L 101  103  102   CO2 22 - 32 mmol/L 16  18  18    Calcium  8.9 - 10.3 mg/dL 8.0  7.9  7.8    CT ABDOMEN PELVIS WO CONTRAST Result Date: 03/27/2024 EXAM: CT ABDOMEN AND PELVIS WITHOUT CONTRAST 03/27/2024 09:18:00 PM TECHNIQUE: CT of the abdomen and pelvis was performed without the administration of intravenous contrast. Multiplanar reformatted images are provided for review. Automated exposure control, iterative reconstruction, and/or weight based adjustment of the mA/kV was utilized to reduce the radiation dose to as low as reasonably achievable. COMPARISON: None available. CLINICAL HISTORY: Abdominal pain, acute, nonlocalized. Pt c/o LLQ abdominal pain and n/v/d x3 weeks. Pain score 10/10. Pt has not taken anything for symptoms. Pt hasn't had any cancer treatments or interventions in several months. FINDINGS: LOWER CHEST: Tiny hiatal hernia. LIVER: The liver is unremarkable. GALLBLADDER AND BILE DUCTS: Gallbladder is unremarkable. No biliary ductal dilatation. SPLEEN: No acute abnormality. PANCREAS: No acute abnormality. ADRENAL GLANDS: No acute abnormality. KIDNEYS, URETERS AND BLADDER: Status post right nephroureterectomy. Left double-pigtail ureteral stent in satisfactory position. No hydronephrosis. No perinephric or periureteral stranding. Urinary bladder is unremarkable. GI AND BOWEL: Sigmoid diverticulosis, without evidence of diverticulitis. Normal appendix (image 53). There is no bowel obstruction. No bowel wall thickening. PERITONEUM AND RETROPERITONEUM: No ascites. No free air. VASCULATURE: Atherosclerotic calcifications of the abdominal aorta and branch  vessels. Progressive left periaortic nodes measuring up to 10 mm short axis (image 42), previously 6 mm. LYMPH NODES: Progressive left periaortic nodes measuring up to 10 mm short axis (image 42), previously 6 mm. REPRODUCTIVE ORGANS: No acute abnormality. BONES AND SOFT TISSUES: No acute osseous abnormality. No focal soft tissue abnormality. IMPRESSION: 1. Left ureteral stent in satisfactory position. 2. Status post right nephroureterectomy. 3. Progressive left periaortic nodes measuring up to 10 mm short axis, suspicious for nodal metastases. Electronically signed by: Pinkie Pebbles MD 03/27/2024 09:27 PM EDT RP Workstation: [id number]    Family Communication: Discussed with patient, understand and agree. All questions answered.  Disposition: Status is: Inpatient Remains inpatient appropriate because: monitoring kidney function, respiratory status  Planned Discharge Destination: Home with Home Health  Time spent: 38 minutes  Author: Concepcion Riser, MD 03/29/2024  12:56 PM Secure chat 7am to 7pm For on call review [email or web address].

## 2024-03-29 NOTE — Plan of Care (Addendum)
 VSS. No c/o pain. IV fluids discontinued per order. Patient ambulating x1 assist to bathroom throughout shift. LBM 6/19; patient still needs a stool sample. No acute events overnight.  Problem: Education: Goal: Knowledge of General Education information will improve Description: Including pain rating scale, medication(s)/side effects and non-pharmacologic comfort measures Outcome: Progressing   Problem: Clinical Measurements: Goal: Ability to maintain clinical measurements within normal limits will improve Outcome: Progressing Goal: Will remain free from infection Outcome: Progressing   Problem: Safety: Goal: Ability to remain free from injury will improve Outcome: Progressing   Problem: Education: Goal: Knowledge of disease and its progression will improve Outcome: Progressing   Problem: Clinical Measurements: Goal: Complications related to the disease process or treatment will be avoided or minimized Outcome: Progressing   Problem: Activity: Goal: Activity intolerance will improve Outcome: Progressing   Problem: Fluid Volume: Goal: Fluid volume balance will be maintained or improved Outcome: Progressing   Problem: Nutritional: Goal: Ability to make appropriate dietary choices will improve Outcome: Progressing   Problem: Respiratory: Goal: Respiratory symptoms related to disease process will be avoided Outcome: Progressing   Problem: Urinary Elimination: Goal: Progression of disease will be identified and treated Outcome: Progressing

## 2024-03-30 DIAGNOSIS — C679 Malignant neoplasm of bladder, unspecified: Secondary | ICD-10-CM | POA: Diagnosis not present

## 2024-03-30 DIAGNOSIS — E871 Hypo-osmolality and hyponatremia: Secondary | ICD-10-CM | POA: Diagnosis not present

## 2024-03-30 DIAGNOSIS — N179 Acute kidney failure, unspecified: Secondary | ICD-10-CM | POA: Diagnosis not present

## 2024-03-30 DIAGNOSIS — N184 Chronic kidney disease, stage 4 (severe): Secondary | ICD-10-CM | POA: Diagnosis not present

## 2024-03-30 LAB — CBC
HCT: 21.8 % — ABNORMAL LOW (ref 36.0–46.0)
Hemoglobin: 6.7 g/dL — CL (ref 12.0–15.0)
MCH: 27.9 pg (ref 26.0–34.0)
MCHC: 30.7 g/dL (ref 30.0–36.0)
MCV: 90.8 fL (ref 80.0–100.0)
Platelets: 199 10*3/uL (ref 150–400)
RBC: 2.4 MIL/uL — ABNORMAL LOW (ref 3.87–5.11)
RDW: 16 % — ABNORMAL HIGH (ref 11.5–15.5)
WBC: 7.3 10*3/uL (ref 4.0–10.5)
nRBC: 0 % (ref 0.0–0.2)

## 2024-03-30 LAB — URINE CULTURE: Culture: 40000 — AB

## 2024-03-30 LAB — BASIC METABOLIC PANEL WITH GFR
Anion gap: 10 (ref 5–15)
BUN: 73 mg/dL — ABNORMAL HIGH (ref 8–23)
CO2: 16 mmol/L — ABNORMAL LOW (ref 22–32)
Calcium: 7.7 mg/dL — ABNORMAL LOW (ref 8.9–10.3)
Chloride: 107 mmol/L (ref 98–111)
Creatinine, Ser: 6.31 mg/dL — ABNORMAL HIGH (ref 0.44–1.00)
GFR, Estimated: 6 mL/min — ABNORMAL LOW (ref >60)
Glucose, Bld: 86 mg/dL (ref 70–99)
Potassium: 4.1 mmol/L (ref 3.5–5.1)
Sodium: 133 mmol/L — ABNORMAL LOW (ref 135–145)

## 2024-03-30 LAB — PREPARE RBC (CROSSMATCH)

## 2024-03-30 MED ORDER — FLUCONAZOLE 100 MG PO TABS
100.0000 mg | ORAL_TABLET | Freq: Every day | ORAL | Status: DC
  Administered 2024-03-30 – 2024-04-03 (×5): 100 mg via ORAL
  Filled 2024-03-30 (×5): qty 1

## 2024-03-30 MED ORDER — SODIUM CHLORIDE 0.9% IV SOLUTION: Freq: Once | INTRAVENOUS | Status: AC

## 2024-03-30 NOTE — Progress Notes (Signed)
 [facility] Progress Note   Assessment/ Plan:    AKI on CKD IV: 2/2 hypovolemia and pre-renal insults.  Making urine.  Not uremic             - s/p IVFs             - no need for RRT at present             - hopefully will go back to baseline (Cr ~3)   2.  Hyponatremia:             - improving   3.  Metabolic acidosis:             - mild, monitor   4.  Anemia             - not an ESA candidate d/t malignancy  - getting 1 u pRBCS today  - anemia workup ongoing   5.  Diarrhea:             - improving Gi path panel pending   6.  Dispo: admitted  Subjective:    Seen today in room. Cr plateaued, making good urine.  Hgb dropped, getting 1 u pRBCS    Objective:   BP 134/71 (BP Location: Left Arm)   Pulse (!) 56   Temp 98.7 F (37.1 C) (Oral)   Resp 17   Ht 5' 2.99 (1.6 m)   Wt 76.8 kg   SpO2 96%   BMI 30.00 kg/m   Intake/Output Summary (Last 24 hours) at 03/30/2024 1213 Last data filed at 03/30/2024 1045 Gross per 24 hour  Intake 0 ml  Output 900 ml  Net -900 ml   Weight change:   Physical Exam: GEN lying flat in bed, NAD HEENT EOMI PERRL, dry MM NECK flat neck veins PULM clear CV RRR ABD soft EXT no LE edema NEURO AAO x 3 nonfocal SKIN poor turgor  Imaging: No results found.   Labs: BMET Recent Labs  Lab 03/27/24 1644 03/27/24 2345 03/28/24 0529 03/29/24 0827 03/30/24 0640  NA 131* 132* 134* 131* 133*  K 3.9 3.8 3.9 4.0 4.1  CL 97* 102 103 101 107  CO2 20* 18* 18* 16* 16*  GLUCOSE 114* 97 85 110* 86  BUN 79* 79* 76* 73* 73*  CREATININE 7.87* 7.13* 7.04* 6.36* 6.31*  CALCIUM  8.4* 7.8* 7.9* 8.0* 7.7*   CBC Recent Labs  Lab 03/27/24 2345 03/28/24 0529 03/29/24 0827 03/30/24 0640  WBC 11.4* 9.6 6.4 7.3  HGB 7.6* 7.8* 7.5* 6.7*  HCT 24.1* 26.3* 24.1* 21.8*  MCV 89.3 94.9 90.3 90.8  PLT 274 250 237 199    Medications:     carvedilol   6.25 mg Oral BID WC   famotidine   20 mg Oral Daily   ferrous sulfate   325 mg Oral  Daily   isosorbide  mononitrate  15 mg Oral Daily   rosuvastatin   10 mg Oral Daily   sertraline   50 mg Oral q morning    Almarie Bonine, MD 03/30/2024, 12:13 PM

## 2024-03-30 NOTE — Progress Notes (Signed)
 Progress Note   Patient: [name] FMW:[id number] DOB: 08-31-1946 DOA: 03/27/2024     3 DOS: the patient was seen and examined on 03/30/2024   Brief hospital course: [name] is a 78 y.o. female with medical history significant of HLD, Hx of PE,HTN,CAD non obstructive CAD, OSA not on CPAP, bladder cancer s/p right nephroureterectomy for cancer with last chemo treatment 2 mo ago ,anemia,GERD, CHF pef ef 63%,, hx of PE 2022, CKD IV with progression followed by nephrology most recent labs 6/10 noted cr of 3.3, patient presents to ED with diffuse abdominal pain associated with n/v/d x2-3 weeks.   In ED creatinine 7.87, CT abdomen/ pelvis showed no acute findings though suspicious nodal mets admitted to hospitalist service for further management of acute on CKD stage 4, Nephrology consulted, advised gentle IV fluids, no HD need.  Assessment and Plan: Acute on CKD stage IV: Due to poor po intake, severe diarrhea since 3 weeks. She is eating fair, diarrhea improved. Nephrology follow up appreciated, she is making urine, no urgent need for HD at this time. Kidney function mild improvement with fluids. Continue IV fluids for 1 day. Continue to monitor daily renal function, strict input and output. Avoid nephrotoxic drugs.  Yeast UTI- Diflucan  ordered. Stop rocephin . Urine cultures added to prior sample pending.  Hyponatremia- Due to hypovolemia, dehydration. Continue to trend Sodium.  Acute on chronic anemia- Hb ranging from 7-11. Today Hb 6.7.  One unit PRBC transfusion ordere. Ferritin high due to anemia of chronic disease. She denies active bleeding. Stool occult pending. Hold heparin  sq.  Diarrhea  No more loose stools per patient. CT abd negative, get stool for GI panel, c.diff if she is able to provide sample.   Hx of Bladder cancer  s/p right nephroureterectomy for cancer with last chemo treatment 2 mo ago. Outpatient oncology follow up once kidney function  improves.  Hypertension, borderline Continue carvedilol  and imdur   prn hydralazine     CAD,nonobstructive Continue on asa, statin, carvedilol ,imdur    OSA Not on cpap  prn at bedtime O2    HFpEF Compensated continue on carvedilol   EF 63% ,grade II diastolic dysfunction   GERD Continue Pepcid .   HLD  Continue on crestor .  Out of bed to chair. Incentive spirometry. Nursing supportive care. Fall, aspiration precautions. Diet:  Diet Orders (From admission, onward)     Start     Ordered   03/27/24 2301  Diet Heart Room service appropriate? Yes; Fluid consistency: Thin  Diet effective now       Question Answer Comment  Room service appropriate? Yes   Fluid consistency: Thin      03/27/24 2302           DVT prophylaxis: heparin  injection 5,000 Units Start: 03/28/24 0600  Level of care: Telemetry   Code Status: Full Code  Subjective: Patient is seen and examined today morning. She feels better, no loose stools. Eating fair. Encourage out of bed, incentive spirometry.  Physical Exam: Vitals:   03/29/24 0843 03/29/24 1629 03/29/24 2049 03/30/24 0446  BP: (!) 130/59 (!) 117/56 (!) 131/56 136/64  Pulse: 81 60 60 63  Resp: 18 20 18 20   Temp: 97.9 F (36.6 C)  98.6 F (37 C) 99.2 F (37.3 C)  TempSrc: Oral  Oral Oral  SpO2: 95% 96% 94% 93%  Weight:      Height:        General - Elderly Caucasian female, mild respiratory distress HEENT - PERRLA, EOMI, atraumatic  head, non tender sinuses. Lung - Clear, basal rales, rhonchi, no wheezes. Heart - S1, S2 heard, no murmurs, rubs, no pedal edema. Abdomen - Soft, non tender, bowel sounds good Neuro - Alert, awake and oriented x 3, non focal exam. Skin - Warm and dry.  Data Reviewed:      Latest Ref Rng & Units 03/30/2024    6:40 AM 03/29/2024    8:27 AM 03/28/2024    5:29 AM  CBC  WBC 4.0 - 10.5 K/uL 7.3  6.4  9.6   Hemoglobin 12.0 - 15.0 g/dL 6.7  7.5  7.8   Hematocrit 36.0 - 46.0 % 21.8  24.1  26.3    Platelets 150 - 400 K/uL 199  237  250       Latest Ref Rng & Units 03/30/2024    6:40 AM 03/29/2024    8:27 AM 03/28/2024    5:29 AM  BMP  Glucose 70 - 99 mg/dL 86  889  85   BUN 8 - 23 mg/dL 73  73  76   Creatinine 0.44 - 1.00 mg/dL 3.68  3.63  2.95   Sodium 135 - 145 mmol/L 133  131  134   Potassium 3.5 - 5.1 mmol/L 4.1  4.0  3.9   Chloride 98 - 111 mmol/L 107  101  103   CO2 22 - 32 mmol/L 16  16  18    Calcium  8.9 - 10.3 mg/dL 7.7  8.0  7.9    No results found.   Family Communication: Discussed with patient, understand and agree. All questions answered.  Disposition: Status is: Inpatient Remains inpatient appropriate because: monitoring kidney function, PRBC transfusion.  Planned Discharge Destination: Home with Home Health  Time spent: 39 minutes  Author: Concepcion Riser, MD 03/30/2024 9:56 AM Secure chat 7am to 7pm For on call review [email or web address].

## 2024-03-30 NOTE — Plan of Care (Signed)
  Problem: Education: Goal: Knowledge of General Education information will improve Description: Including pain rating scale, medication(s)/side effects and non-pharmacologic comfort measures Outcome: Progressing   Problem: Health Behavior/Discharge Planning: Goal: Ability to manage health-related needs will improve Outcome: Progressing   Problem: Clinical Measurements: Goal: Ability to maintain clinical measurements within normal limits will improve Outcome: Progressing Goal: Will remain free from infection Outcome: Progressing Goal: Diagnostic test results will improve Outcome: Progressing Goal: Respiratory complications will improve Outcome: Progressing Goal: Cardiovascular complication will be avoided Outcome: Progressing   Problem: Activity: Goal: Risk for activity intolerance will decrease Outcome: Progressing   Problem: Nutrition: Goal: Adequate nutrition will be maintained Outcome: Progressing   Problem: Coping: Goal: Level of anxiety will decrease Outcome: Progressing   Problem: Elimination: Goal: Will not experience complications related to bowel motility Outcome: Progressing Goal: Will not experience complications related to urinary retention Outcome: Progressing   Problem: Pain Managment: Goal: General experience of comfort will improve and/or be controlled Outcome: Progressing   Problem: Safety: Goal: Ability to remain free from injury will improve Outcome: Progressing   Problem: Skin Integrity: Goal: Risk for impaired skin integrity will decrease Outcome: Progressing   Problem: Education: Goal: Knowledge of disease and its progression will improve Outcome: Progressing   Problem: Clinical Measurements: Goal: Complications related to the disease process or treatment will be avoided or minimized Outcome: Progressing   Problem: Activity: Goal: Activity intolerance will improve Outcome: Progressing   Problem: Fluid Volume: Goal: Fluid volume  balance will be maintained or improved Outcome: Progressing   Problem: Nutritional: Goal: Ability to make appropriate dietary choices will improve Outcome: Progressing   Problem: Respiratory: Goal: Respiratory symptoms related to disease process will be avoided Outcome: Progressing   Problem: Urinary Elimination: Goal: Progression of disease will be identified and treated Outcome: Progressing

## 2024-03-31 DIAGNOSIS — N179 Acute kidney failure, unspecified: Secondary | ICD-10-CM | POA: Diagnosis not present

## 2024-03-31 DIAGNOSIS — N184 Chronic kidney disease, stage 4 (severe): Secondary | ICD-10-CM | POA: Diagnosis not present

## 2024-03-31 DIAGNOSIS — E871 Hypo-osmolality and hyponatremia: Secondary | ICD-10-CM | POA: Diagnosis not present

## 2024-03-31 DIAGNOSIS — C679 Malignant neoplasm of bladder, unspecified: Secondary | ICD-10-CM | POA: Diagnosis not present

## 2024-03-31 LAB — BPAM RBC
Blood Product Expiration Date: 202507202359
ISSUE DATE / TIME: 202506221048
Unit Type and Rh: 5100

## 2024-03-31 LAB — BASIC METABOLIC PANEL WITH GFR
Anion gap: 9 (ref 5–15)
BUN: 66 mg/dL — ABNORMAL HIGH (ref 8–23)
CO2: 17 mmol/L — ABNORMAL LOW (ref 22–32)
Calcium: 8.2 mg/dL — ABNORMAL LOW (ref 8.9–10.3)
Chloride: 109 mmol/L (ref 98–111)
Creatinine, Ser: 6 mg/dL — ABNORMAL HIGH (ref 0.44–1.00)
GFR, Estimated: 7 mL/min — ABNORMAL LOW (ref >60)
Glucose, Bld: 101 mg/dL — ABNORMAL HIGH (ref 70–99)
Potassium: 4.6 mmol/L (ref 3.5–5.1)
Sodium: 135 mmol/L (ref 135–145)

## 2024-03-31 LAB — TYPE AND SCREEN
ABO/RH(D): O POS
Antibody Screen: NEGATIVE
Unit division: 0

## 2024-03-31 LAB — CBC
HCT: 26.3 % — ABNORMAL LOW (ref 36.0–46.0)
Hemoglobin: 8.4 g/dL — ABNORMAL LOW (ref 12.0–15.0)
MCH: 28.5 pg (ref 26.0–34.0)
MCHC: 31.9 g/dL (ref 30.0–36.0)
MCV: 89.2 fL (ref 80.0–100.0)
Platelets: 259 10*3/uL (ref 150–400)
RBC: 2.95 MIL/uL — ABNORMAL LOW (ref 3.87–5.11)
RDW: 16.5 % — ABNORMAL HIGH (ref 11.5–15.5)
WBC: 11.7 10*3/uL — ABNORMAL HIGH (ref 4.0–10.5)
nRBC: 0 % (ref 0.0–0.2)

## 2024-03-31 MED ORDER — STERILE WATER FOR INJECTION IV SOLN
INTRAVENOUS | Status: DC
  Filled 2024-03-31: qty 1000
  Filled 2024-03-31: qty 150
  Filled 2024-03-31: qty 1000
  Filled 2024-03-31 (×4): qty 150

## 2024-03-31 NOTE — Progress Notes (Signed)
 [facility] Progress Note   Subjective:    Good UOP 600 cc yesterday Creat 7.8 -> 6.3 -> 6.0 today Had some wheezing last night and assoc SOB, no orthopnea On RA today   Objective:   BP 114/61 (BP Location: Left Arm)   Pulse (!) 51   Temp 97.7 F (36.5 C)   Resp 16   Ht 5' 2.99 (1.6 m)   Wt 76.8 kg   SpO2 98%   BMI 30.00 kg/m    Physical Exam: GEN lying flat in bed, NAD HEENT EOMI PERRL NECK flat neck veins PULM clear CV RRR ABD soft EXT no LE edema NEURO AAO x 3 nonfocal  Date   Creat   eGFR (ml/min) 2020   1.22- 2.54 2021   1.10- 1.40 2022   1.20- 1.35 2023   1.29- 3.64  13- 45 ml/min  Feb- sept '24  2.23- 2.90  20- 21 ml/min  10/24- 08/10/23 6.13 >> 3.53  7 >> 13 ml/min 12/17/23  3.03   16   01/22/24  3.15  03/27/24  7.87 6/20   7.04 6/21   6.36 6/22   6.31 03/31/24  6.00   Assessment/ Plan:    AKI on CKD 5: b/l creat 3.0- 3.5 from late 2024 and march 2025, eGFR 13- 16 ml/min.  Due to hypovolemia and pre-renal insults.  Making urine.  Not uremic. F/b Dr Dolan at [facility], every 3 mos.  - resume IVFs as may still be a bit dry              - hopefully will get back close to baseline (Cr ~ 3-4)   2.  Hyponatremia:             - resolved   3.  Metabolic acidosis:             - CO2 17, use bicarb IVFs   4.  Anemia             - not an ESA candidate d/t malignancy  - getting 1 u pRBCS today  - anemia workup ongoing   5.  Diarrhea:             - improving Gi path panel pending    Myer Fret  MD  [facility] 03/31/2024, 4:39 PM  Recent Labs  Lab 03/27/24 1644 03/27/24 2345 03/28/24 0529 03/29/24 0827 03/30/24 0640 03/31/24 0544  HGB 8.9*   < > 7.8*   < > 6.7* 8.4*  ALBUMIN  2.9*  --  2.5*  --   --   --   CALCIUM  8.4*   < > 7.9*   < > 7.7* 8.2*  CREATININE 7.87*   < > 7.04*   < > 6.31* 6.00*  K 3.9   < > 3.9   < > 4.1 4.6   < > = values in this interval not displayed.    Inpatient medications:  carvedilol   6.25 mg Oral BID WC    famotidine   20 mg Oral Daily   ferrous sulfate   325 mg Oral Daily   fluconazole   100 mg Oral Daily   isosorbide  mononitrate  15 mg Oral Daily   rosuvastatin   10 mg Oral Daily   sertraline   50 mg Oral q morning    acetaminophen  **OR** acetaminophen , albuterol , hydrALAZINE , ondansetron  **OR** ondansetron  (ZOFRAN ) IV, mouth rinse

## 2024-03-31 NOTE — Plan of Care (Signed)
   Problem: Education: Goal: Knowledge of General Education information will improve Description Including pain rating scale, medication(s)/side effects and non-pharmacologic comfort measures Outcome: Progressing   Problem: Health Behavior/Discharge Planning: Goal: Ability to manage health-related needs will improve Outcome: Progressing

## 2024-03-31 NOTE — Progress Notes (Signed)
 Progress Note   Patient: [name] FMW:[id number] DOB: [birth year] DOA: 03/27/2024     4 DOS: the patient was seen and examined on 03/31/2024   Brief hospital course: [name] is a 78 y.o. female with medical history significant of HLD, Hx of PE,HTN,CAD non obstructive CAD, OSA not on CPAP, bladder cancer s/p right nephroureterectomy for cancer with last chemo treatment 2 mo ago ,anemia,GERD, CHF pef ef 63%,, hx of PE 2022, CKD IV with progression followed by nephrology most recent labs 6/10 noted cr of 3.3, patient presents to ED with diffuse abdominal pain associated with n/v/d x2-3 weeks.   In ED creatinine 7.87, CT abdomen/ pelvis showed no acute findings though suspicious nodal mets admitted to hospitalist service for further management of acute on CKD stage 4, Nephrology consulted, advised gentle IV fluids, no HD need.  Assessment and Plan: Acute on CKD stage IV: Due to poor po intake, severe diarrhea since 3 weeks. She is eating fair, diarrhea improved. Nephrology follow up appreciated, she is making urine, no urgent need for HD at this time. Kidney function mild improvement with fluids. She got IV hydration, PRBC transfusion yesterday. Continue to monitor daily renal function, strict input and output. Avoid nephrotoxic drugs.  Yeast UTI- Diflucan  ordered. Stopped rocephin .  Hyponatremia- Due to hypovolemia, dehydration. Sodium improved with fluids.  Acute on chronic anemia- Hb 8.4 from 6.7 yesterday s/p transfusion on 1u PRBC Ferritin high due to anemia of chronic disease. She denies active bleeding. Stool occult pending. Hold heparin  sq.  Diarrhea  No more loose stools per patient. CT abd negative. GI panel, c.diff pending as she is unable to provide sample.   Hx of Bladder cancer  s/p right nephroureterectomy for cancer with last chemo treatment 2 mo ago. Outpatient oncology follow up once kidney function improves.  Hypertension, borderline Continue  carvedilol  and imdur   prn hydralazine     CAD,nonobstructive Continue on asa, statin, carvedilol ,imdur    OSA Not on cpap  prn at bedtime O2    HFpEF Compensated continue on carvedilol   EF 63% ,grade II diastolic dysfunction   GERD Continue Pepcid .   HLD  Continue on crestor .  Out of bed to chair. Incentive spirometry. Nursing supportive care. Fall, aspiration precautions. Diet:  Diet Orders (From admission, onward)     Start     Ordered   03/27/24 2301  Diet Heart Room service appropriate? Yes; Fluid consistency: Thin  Diet effective now       Question Answer Comment  Room service appropriate? Yes   Fluid consistency: Thin      03/27/24 2302           DVT prophylaxis:   Level of care: Telemetry   Code Status: Full Code  Subjective: Patient is seen and examined today morning. She feels better, no shortness of breath. Eating fair. Encourage out of bed, incentive spirometry.  Physical Exam: Vitals:   03/30/24 1310 03/30/24 1947 03/31/24 0507 03/31/24 1243  BP: (!) 156/64 (!) 147/64 (!) 155/70 114/61  Pulse: (!) 57 (!) 58 65 (!) 51  Resp: 16 17 18 16   Temp: 98.3 F (36.8 C) 98.6 F (37 C) 98.4 F (36.9 C) 97.7 F (36.5 C)  TempSrc: Oral     SpO2: 95% 96% 95% 98%  Weight:      Height:        General - Elderly Caucasian female, no respiratory distress HEENT - PERRLA, EOMI, atraumatic head, non tender sinuses. Lung - Clear, basal rales, rhonchi,  no wheezes. Heart - S1, S2 heard, no murmurs, rubs, no pedal edema. Abdomen - Soft, non tender, bowel sounds good Neuro - Alert, awake and oriented x 3, non focal exam. Skin - Warm and dry.  Data Reviewed:      Latest Ref Rng & Units 03/31/2024    5:44 AM 03/30/2024    6:40 AM 03/29/2024    8:27 AM  CBC  WBC 4.0 - 10.5 K/uL 11.7  7.3  6.4   Hemoglobin 12.0 - 15.0 g/dL 8.4  6.7  7.5   Hematocrit 36.0 - 46.0 % 26.3  21.8  24.1   Platelets 150 - 400 K/uL 259  199  237       Latest Ref Rng & Units  03/31/2024    5:44 AM 03/30/2024    6:40 AM 03/29/2024    8:27 AM  BMP  Glucose 70 - 99 mg/dL 898  86  889   BUN 8 - 23 mg/dL 66  73  73   Creatinine 0.44 - 1.00 mg/dL 3.99  3.68  3.63   Sodium 135 - 145 mmol/L 135  133  131   Potassium 3.5 - 5.1 mmol/L 4.6  4.1  4.0   Chloride 98 - 111 mmol/L 109  107  101   CO2 22 - 32 mmol/L 17  16  16    Calcium  8.9 - 10.3 mg/dL 8.2  7.7  8.0    No results found.   Family Communication: Discussed with patient, understand and agree. All questions answered.  Disposition: Status is: Inpatient Remains inpatient appropriate because: monitoring kidney function, H/H.  Planned Discharge Destination: Home with Home Health  Time spent: 38 minutes  Author: Concepcion Riser, MD 03/31/2024 2:12 PM Secure chat 7am to 7pm For on call review [email or web address].

## 2024-03-31 NOTE — Plan of Care (Signed)
  Problem: Education: Goal: Knowledge of General Education information will improve Description Including pain rating scale, medication(s)/side effects and non-pharmacologic comfort measures Outcome: Progressing   Problem: Clinical Measurements: Goal: Ability to maintain clinical measurements within normal limits will improve Outcome: Progressing Goal: Will remain free from infection Outcome: Progressing Goal: Diagnostic test results will improve Outcome: Progressing   Problem: Nutrition: Goal: Adequate nutrition will be maintained Outcome: Progressing   Problem: Coping: Goal: Level of anxiety will decrease Outcome: Progressing   Problem: Safety: Goal: Ability to remain free from injury will improve Outcome: Progressing

## 2024-03-31 NOTE — Plan of Care (Signed)
  Problem: Education: Goal: Knowledge of General Education information will improve Description: Including pain rating scale, medication(s)/side effects and non-pharmacologic comfort measures Outcome: Progressing   Problem: Elimination: Goal: Will not experience complications related to bowel motility Outcome: Progressing Goal: Will not experience complications related to urinary retention Outcome: Progressing   Problem: Safety: Goal: Ability to remain free from injury will improve Outcome: Progressing   Problem: Clinical Measurements: Goal: Complications related to the disease process or treatment will be avoided or minimized Outcome: Progressing

## 2024-04-01 DIAGNOSIS — N179 Acute kidney failure, unspecified: Secondary | ICD-10-CM | POA: Diagnosis not present

## 2024-04-01 DIAGNOSIS — N184 Chronic kidney disease, stage 4 (severe): Secondary | ICD-10-CM | POA: Diagnosis not present

## 2024-04-01 DIAGNOSIS — C679 Malignant neoplasm of bladder, unspecified: Secondary | ICD-10-CM | POA: Diagnosis not present

## 2024-04-01 DIAGNOSIS — E871 Hypo-osmolality and hyponatremia: Secondary | ICD-10-CM | POA: Diagnosis not present

## 2024-04-01 LAB — BASIC METABOLIC PANEL WITH GFR
Anion gap: 10 (ref 5–15)
BUN: 66 mg/dL — ABNORMAL HIGH (ref 8–23)
CO2: 20 mmol/L — ABNORMAL LOW (ref 22–32)
Calcium: 8 mg/dL — ABNORMAL LOW (ref 8.9–10.3)
Chloride: 106 mmol/L (ref 98–111)
Creatinine, Ser: 5.34 mg/dL — ABNORMAL HIGH (ref 0.44–1.00)
GFR, Estimated: 8 mL/min — ABNORMAL LOW (ref >60)
Glucose, Bld: 99 mg/dL (ref 70–99)
Potassium: 4.1 mmol/L (ref 3.5–5.1)
Sodium: 136 mmol/L (ref 135–145)

## 2024-04-01 MED ORDER — SENNOSIDES-DOCUSATE SODIUM 8.6-50 MG PO TABS
2.0000 | ORAL_TABLET | Freq: Two times a day (BID) | ORAL | Status: DC
  Administered 2024-04-01 – 2024-04-02 (×3): 2 via ORAL
  Administered 2024-04-03: 1 via ORAL
  Filled 2024-04-01 (×5): qty 2

## 2024-04-01 MED ORDER — TRAZODONE HCL 50 MG PO TABS
150.0000 mg | ORAL_TABLET | Freq: Every day | ORAL | Status: DC
  Administered 2024-04-01 – 2024-04-02 (×2): 150 mg via ORAL
  Filled 2024-04-01 (×3): qty 1

## 2024-04-01 NOTE — Progress Notes (Signed)
 Progress Note   Patient: [name] FMW:[id number] DOB: July 05, 1946 DOA: 03/27/2024     5 DOS: the patient was seen and examined on 04/01/2024   Brief hospital course: [name] is a 78 y.o. female with medical history significant of HLD, Hx of PE,HTN,CAD non obstructive CAD, OSA not on CPAP, bladder cancer s/p right nephroureterectomy for cancer with last chemo treatment 2 mo ago ,anemia,GERD, CHF pef ef 63%,, hx of PE 2022, CKD IV with progression followed by nephrology most recent labs 6/10 noted cr of 3.3, patient presents to ED with diffuse abdominal pain associated with n/v/d x2-3 weeks.   In ED creatinine 7.87, CT abdomen/ pelvis showed no acute findings though suspicious nodal mets admitted to hospitalist service for further management of acute on CKD stage 4, Nephrology consulted, advised gentle IV fluids, no HD need.  Assessment and Plan: Acute on CKD stage IV: Due to poor po intake, severe diarrhea since 3 weeks. She is eating fair now, diarrhea improved. Nephrology follow up appreciated, she is making urine, no urgent need for HD at this time. Kidney function slow improvement with fluids. continue IV hydration, PRBC transfused 03/30/24. Continue to monitor daily renal function, strict input and output. Avoid nephrotoxic drugs.  Yeast UTI- Continue Diflucan .   Hyponatremia- Due to hypovolemia, dehydration. Sodium improved with fluids.  Acute on chronic anemia- Hb 8.4 from 6.7 yesterday s/p transfusion on 1u PRBC. Repeat CBC for tomorrow ordered. Ferritin high due to anemia of chronic disease. She denies active bleeding. Stool occult pending. Continue to hold heparin  sq.  Diarrhea  No more loose stools per patient. CT abd negative. GI panel, c.diff pending as she is unable to provide sample.   Hx of Bladder cancer  s/p right nephroureterectomy for cancer with last chemo treatment 2 mo ago. Outpatient oncology follow up once kidney function  improves.  Hypertension, borderline Continue carvedilol  and imdur   prn hydralazine     CAD,nonobstructive Continue on asa, statin, carvedilol ,imdur    OSA Not on cpap  prn at bedtime O2    HFpEF Compensated continue on carvedilol   EF 63%, grade II diastolic dysfunction   GERD Continue Pepcid .   HLD  Continue on crestor .  Out of bed to chair. Incentive spirometry. Nursing supportive care. Fall, aspiration precautions. Diet:  Diet Orders (From admission, onward)     Start     Ordered   03/27/24 2301  Diet Heart Room service appropriate? Yes; Fluid consistency: Thin  Diet effective now       Question Answer Comment  Room service appropriate? Yes   Fluid consistency: Thin      03/27/24 2302           DVT prophylaxis: SCD  Level of care: Telemetry   Code Status: Full Code  Subjective: Patient is seen and examined today morning. She denies any complaints. Eating fair. Advised out of bed, incentive spirometry.  Physical Exam: Vitals:   03/31/24 1243 03/31/24 2035 04/01/24 0519 04/01/24 0931  BP: 114/61 (!) 147/77 (!) 159/71 (!) 149/71  Pulse: (!) 51 (!) 51 (!) 57   Resp: 16 18 18    Temp: 97.7 F (36.5 C) 98 F (36.7 C) 97.9 F (36.6 C)   TempSrc:  Oral Oral   SpO2: 98% 97% 95%   Weight:      Height:        General - Elderly Caucasian female, no respiratory distress HEENT - PERRLA, EOMI, atraumatic head, non tender sinuses. Lung - Clear, basal rales, rhonchi,  no wheezes. Heart - S1, S2 heard, no murmurs, rubs, no pedal edema. Abdomen - Soft, non tender, bowel sounds good Neuro - Alert, awake and oriented x 3, non focal exam. Skin - Warm and dry.  Data Reviewed:      Latest Ref Rng & Units 03/31/2024    5:44 AM 03/30/2024    6:40 AM 03/29/2024    8:27 AM  CBC  WBC 4.0 - 10.5 K/uL 11.7  7.3  6.4   Hemoglobin 12.0 - 15.0 g/dL 8.4  6.7  7.5   Hematocrit 36.0 - 46.0 % 26.3  21.8  24.1   Platelets 150 - 400 K/uL 259  199  237       Latest Ref Rng &  Units 04/01/2024    5:30 AM 03/31/2024    5:44 AM 03/30/2024    6:40 AM  BMP  Glucose 70 - 99 mg/dL 99  898  86   BUN 8 - 23 mg/dL 66  66  73   Creatinine 0.44 - 1.00 mg/dL 4.65  3.99  3.68   Sodium 135 - 145 mmol/L 136  135  133   Potassium 3.5 - 5.1 mmol/L 4.1  4.6  4.1   Chloride 98 - 111 mmol/L 106  109  107   CO2 22 - 32 mmol/L 20  17  16    Calcium  8.9 - 10.3 mg/dL 8.0  8.2  7.7    No results found.   Family Communication: Discussed with patient, understand and agree. All questions answered.  Disposition: Status is: Inpatient Remains inpatient appropriate because: monitoring kidney function, H/H.  Planned Discharge Destination: Home with Home Health  Time spent: 39 minutes  Author: Concepcion Riser, MD 04/01/2024 12:43 PM Secure chat 7am to 7pm For on call review [email or web address].

## 2024-04-01 NOTE — Plan of Care (Signed)
  Problem: Education: Goal: Knowledge of General Education information will improve Description: Including pain rating scale, medication(s)/side effects and non-pharmacologic comfort measures Outcome: Progressing   Problem: Health Behavior/Discharge Planning: Goal: Ability to manage health-related needs will improve Outcome: Progressing   Problem: Clinical Measurements: Goal: Ability to maintain clinical measurements within normal limits will improve Outcome: Progressing Goal: Will remain free from infection Outcome: Progressing Goal: Diagnostic test results will improve Outcome: Progressing Goal: Respiratory complications will improve Outcome: Progressing Goal: Cardiovascular complication will be avoided Outcome: Progressing   Problem: Activity: Goal: Risk for activity intolerance will decrease Outcome: Progressing   Problem: Nutrition: Goal: Adequate nutrition will be maintained Outcome: Progressing   Problem: Coping: Goal: Level of anxiety will decrease Outcome: Progressing   Problem: Elimination: Goal: Will not experience complications related to bowel motility Outcome: Progressing Goal: Will not experience complications related to urinary retention Outcome: Progressing   Problem: Pain Managment: Goal: General experience of comfort will improve and/or be controlled Outcome: Progressing   Problem: Safety: Goal: Ability to remain free from injury will improve Outcome: Progressing   Problem: Skin Integrity: Goal: Risk for impaired skin integrity will decrease Outcome: Progressing   Problem: Education: Goal: Knowledge of disease and its progression will improve Outcome: Progressing   Problem: Clinical Measurements: Goal: Complications related to the disease process or treatment will be avoided or minimized Outcome: Progressing   Problem: Activity: Goal: Activity intolerance will improve Outcome: Progressing   Problem: Fluid Volume: Goal: Fluid volume  balance will be maintained or improved Outcome: Progressing   Problem: Nutritional: Goal: Ability to make appropriate dietary choices will improve Outcome: Progressing   Problem: Respiratory: Goal: Respiratory symptoms related to disease process will be avoided Outcome: Progressing   Problem: Urinary Elimination: Goal: Progression of disease will be identified and treated Outcome: Progressing

## 2024-04-01 NOTE — Progress Notes (Addendum)
 [facility] Progress Note   Subjective:    Creat down to 5.3 today w/ IVF's resumed yesterday Some nausea overnight, no diarrhea Keeping liquids down   Objective:   BP (!) 149/71 (BP Location: Right Arm)   Pulse (!) 57   Temp 97.9 F (36.6 C) (Oral)   Resp 18   Ht 5' 2.99 (1.6 m)   Wt 76.8 kg   SpO2 95%   BMI 30.00 kg/m    Physical Exam: GEN lying flat in bed, NAD HEENT EOMI PERRL NECK flat neck veins PULM clear CV RRR ABD soft EXT no LE edema NEURO AAO x 3 nonfocal  Date   Creat   eGFR (ml/min) 2020   1.22- 2.54 2021   1.10- 1.40 2022   1.20- 1.35 2023   1.29- 3.64  13- 45 ml/min  Feb- sept '24  2.23- 2.90  20- 21 ml/min  10/24- 08/10/23 6.13 >> 3.53  7 >> 13 ml/min 12/17/23  3.03   16   01/22/24  3.15  03/27/24  7.87 6/20   7.04 6/21   6.36 6/22   6.31 03/31/24  6.00   Assessment/ Plan:    AKI on CKD 5: b/l creat 3.0- 3.5 from late 2024 and march 2025, eGFR 13- 16 ml/min. F/b Dr Dolan at [facility], every 3 mos.  AKI due to hypovolemia related to diarrhea/ GI losses. Creat here was 7.8 on admission. Today improved to 5.3.  Needs continued IVF's.  - cont IVF, f/u labs in am             - hopefully will get back close to baseline (Cr ~ 3-4)   2.  Hyponatremia:             - resolved   3.  Metabolic acidosis:             - CO2 17, cont bicarb IVFs   4.  Anemia             - not an ESA candidate d/t malignancy  - getting 1 u pRBCS today  - anemia workup ongoing   5.  Diarrhea:             - improving     Rob Geralynn  MD  [facility] 04/01/2024, 11:12 AM  Recent Labs  Lab 03/27/24 1644 03/27/24 2345 03/28/24 0529 03/29/24 0827 03/30/24 0640 03/31/24 0544 04/01/24 0530  HGB 8.9*   < > 7.8*   < > 6.7* 8.4*  --   ALBUMIN  2.9*  --  2.5*  --   --   --   --   CALCIUM  8.4*   < > 7.9*   < > 7.7* 8.2* 8.0*  CREATININE 7.87*   < > 7.04*   < > 6.31* 6.00* 5.34*  K 3.9   < > 3.9   < > 4.1 4.6 4.1   < > = values in this interval not displayed.     Inpatient medications:  carvedilol   6.25 mg Oral BID WC   famotidine   20 mg Oral Daily   ferrous sulfate   325 mg Oral Daily   fluconazole   100 mg Oral Daily   isosorbide  mononitrate  15 mg Oral Daily   rosuvastatin   10 mg Oral Daily   senna-docusate  2 tablet Oral BID   sertraline   50 mg Oral q morning    sodium bicarbonate  150 mEq in sterile water  1,150 mL infusion 85 mL/hr at 04/01/24 1008   acetaminophen  **  OR** acetaminophen , albuterol , hydrALAZINE , ondansetron  **OR** ondansetron  (ZOFRAN ) IV, mouth rinse

## 2024-04-02 DIAGNOSIS — E871 Hypo-osmolality and hyponatremia: Secondary | ICD-10-CM | POA: Diagnosis not present

## 2024-04-02 DIAGNOSIS — C679 Malignant neoplasm of bladder, unspecified: Secondary | ICD-10-CM | POA: Diagnosis not present

## 2024-04-02 DIAGNOSIS — N179 Acute kidney failure, unspecified: Secondary | ICD-10-CM | POA: Diagnosis not present

## 2024-04-02 DIAGNOSIS — N184 Chronic kidney disease, stage 4 (severe): Secondary | ICD-10-CM | POA: Diagnosis not present

## 2024-04-02 LAB — BASIC METABOLIC PANEL WITH GFR
Anion gap: 11 (ref 5–15)
BUN: 51 mg/dL — ABNORMAL HIGH (ref 8–23)
CO2: 27 mmol/L (ref 22–32)
Calcium: 7.4 mg/dL — ABNORMAL LOW (ref 8.9–10.3)
Chloride: 95 mmol/L — ABNORMAL LOW (ref 98–111)
Creatinine, Ser: 4.31 mg/dL — ABNORMAL HIGH (ref 0.44–1.00)
GFR, Estimated: 10 mL/min — ABNORMAL LOW (ref >60)
Glucose, Bld: 85 mg/dL (ref 70–99)
Potassium: 2.9 mmol/L — ABNORMAL LOW (ref 3.5–5.1)
Sodium: 133 mmol/L — ABNORMAL LOW (ref 135–145)

## 2024-04-02 LAB — CBC
HCT: 25.4 % — ABNORMAL LOW (ref 36.0–46.0)
Hemoglobin: 8.2 g/dL — ABNORMAL LOW (ref 12.0–15.0)
MCH: 27.8 pg (ref 26.0–34.0)
MCHC: 32.3 g/dL (ref 30.0–36.0)
MCV: 86.1 fL (ref 80.0–100.0)
Platelets: 264 10*3/uL (ref 150–400)
RBC: 2.95 MIL/uL — ABNORMAL LOW (ref 3.87–5.11)
RDW: 15.6 % — ABNORMAL HIGH (ref 11.5–15.5)
WBC: 7 10*3/uL (ref 4.0–10.5)
nRBC: 0 % (ref 0.0–0.2)

## 2024-04-02 MED ORDER — POTASSIUM CHLORIDE CRYS ER 20 MEQ PO TBCR
40.0000 meq | EXTENDED_RELEASE_TABLET | Freq: Two times a day (BID) | ORAL | Status: DC
  Administered 2024-04-02 – 2024-04-03 (×3): 40 meq via ORAL
  Filled 2024-04-02 (×3): qty 2

## 2024-04-02 MED ORDER — POTASSIUM CHLORIDE 20 MEQ PO PACK
40.0000 meq | PACK | Freq: Two times a day (BID) | ORAL | Status: DC
  Filled 2024-04-02: qty 2

## 2024-04-02 NOTE — Plan of Care (Signed)

## 2024-04-02 NOTE — Progress Notes (Signed)
 Progress Note   Patient: [name] FMW:[id number] DOB: [birth year] DOA: 03/27/2024     6 DOS: the patient was seen and examined on 04/02/2024   Brief hospital course: ANIYLAH AVANS is a 78 y.o. female with medical history significant of HLD, Hx of PE,HTN,CAD non obstructive CAD, OSA not on CPAP, bladder cancer s/p right nephroureterectomy for cancer with last chemo treatment 2 mo ago ,anemia,GERD, CHF pef ef 63%,, hx of PE 2022, CKD IV with progression followed by nephrology most recent labs 6/10 noted cr of 3.3, patient presents to ED with diffuse abdominal pain associated with n/v/d x2-3 weeks.   In ED creatinine 7.87, CT abdomen/ pelvis showed no acute findings though suspicious nodal mets admitted to hospitalist service for further management of acute on CKD stage 4, Nephrology consulted, advised gentle IV fluids, no HD need.  Assessment and Plan: Acute on CKD stage IV: Due to poor po intake, severe diarrhea since 3 weeks. She is eating fair now, diarrhea improved. Nephrology follow up appreciated, she is making urine, no urgent need for HD at this time. Kidney function slow improvement with fluids. stop IV hydration, PRBC transfused 03/30/24. Continue to monitor daily renal function, strict input and output. Avoid nephrotoxic drugs.  Yeast UTI- Continue Diflucan .   Hyponatremia- Due to hypovolemia, dehydration. Sodium stable.  Acute on chronic anemia- Hb stable from 6.7 03/30/24 s/p transfusion on 1u PRBC. Ferritin high due to anemia of chronic disease. She denies active bleeding. Stool occult pending. Continue to hold heparin  sq.  Diarrhea  No more loose stools per patient. CT abd negative. GI panel, c.diff pending as she is unable to provide sample.   Hx of Bladder cancer  s/p right nephroureterectomy for cancer with last chemo treatment 2 mo ago. Outpatient oncology follow up once kidney function improves.  Hypertension, borderline Continue carvedilol  and imdur    prn hydralazine     CAD,nonobstructive Continue on asa, statin, carvedilol , imdur    OSA Not on cpap  prn at bedtime O2    HFpEF Compensated continue on carvedilol   EF 63%, grade II diastolic dysfunction   GERD Continue Pepcid .   HLD  Continue on crestor .  Stool softeners, laxatives ordered. Enema PRN. Out of bed to chair. Incentive spirometry. Nursing supportive care. Fall, aspiration precautions. Diet:  Diet Orders (From admission, onward)     Start     Ordered   03/27/24 2301  Diet Heart Room service appropriate? Yes; Fluid consistency: Thin  Diet effective now       Question Answer Comment  Room service appropriate? Yes   Fluid consistency: Thin      03/27/24 2302           DVT prophylaxis: SCD  Level of care: Telemetry   Code Status: Full Code  Subjective: Patient is seen and examined today morning. She feels weak. Eating fair. Has constipation.  Physical Exam: Vitals:   04/01/24 2025 04/02/24 0551 04/02/24 0929 04/02/24 1254  BP: (!) 167/64 (!) 161/67 (!) 158/64 (!) 157/64  Pulse: (!) 57 (!) 52  (!) 57  Resp: 18 18  16   Temp: 98 F (36.7 C) 97.8 F (36.6 C)  98 F (36.7 C)  TempSrc:      SpO2: 96% 95%  95%  Weight:      Height:        General - Elderly Caucasian female, no respiratory distress HEENT - PERRLA, EOMI, atraumatic head, non tender sinuses. Lung - Clear, basal rales, rhonchi, no wheezes. Heart -  S1, S2 heard, no murmurs, rubs, no pedal edema. Abdomen - Soft, non tender, bowel sounds good Neuro - Alert, awake and oriented x 3, non focal exam. Skin - Warm and dry.  Data Reviewed:      Latest Ref Rng & Units 04/02/2024    5:32 AM 03/31/2024    5:44 AM 03/30/2024    6:40 AM  CBC  WBC 4.0 - 10.5 K/uL 7.0  11.7  7.3   Hemoglobin 12.0 - 15.0 g/dL 8.2  8.4  6.7   Hematocrit 36.0 - 46.0 % 25.4  26.3  21.8   Platelets 150 - 400 K/uL 264  259  199       Latest Ref Rng & Units 04/02/2024    5:32 AM 04/01/2024    5:30 AM 03/31/2024     5:44 AM  BMP  Glucose 70 - 99 mg/dL 85  99  898   BUN 8 - 23 mg/dL 51  66  66   Creatinine 0.44 - 1.00 mg/dL 5.68  4.65  3.99   Sodium 135 - 145 mmol/L 133  136  135   Potassium 3.5 - 5.1 mmol/L 2.9  4.1  4.6   Chloride 98 - 111 mmol/L 95  106  109   CO2 22 - 32 mmol/L 27  20  17    Calcium  8.9 - 10.3 mg/dL 7.4  8.0  8.2    No results found.   Family Communication: Discussed with patient, understand and agree. All questions answered.  Disposition: Status is: Inpatient Remains inpatient appropriate because: monitoring kidney function, H/H.  Planned Discharge Destination: Home with Home Health  Time spent: 39 minutes  Author: Concepcion Riser, MD 04/02/2024 1:27 PM Secure chat 7am to 7pm For on call review [email or web address].

## 2024-04-02 NOTE — Plan of Care (Signed)
  Problem: Education: Goal: Knowledge of General Education information will improve Description: Including pain rating scale, medication(s)/side effects and non-pharmacologic comfort measures Outcome: Progressing   Problem: Health Behavior/Discharge Planning: Goal: Ability to manage health-related needs will improve Outcome: Progressing   Problem: Clinical Measurements: Goal: Ability to maintain clinical measurements within normal limits will improve Outcome: Progressing Goal: Will remain free from infection Outcome: Progressing Goal: Diagnostic test results will improve Outcome: Progressing Goal: Respiratory complications will improve Outcome: Progressing Goal: Cardiovascular complication will be avoided Outcome: Progressing   Problem: Activity: Goal: Risk for activity intolerance will decrease Outcome: Progressing   Problem: Nutrition: Goal: Adequate nutrition will be maintained Outcome: Progressing   Problem: Coping: Goal: Level of anxiety will decrease Outcome: Progressing   Problem: Elimination: Goal: Will not experience complications related to bowel motility Outcome: Progressing Goal: Will not experience complications related to urinary retention Outcome: Progressing   Problem: Pain Managment: Goal: General experience of comfort will improve and/or be controlled Outcome: Progressing   Problem: Safety: Goal: Ability to remain free from injury will improve Outcome: Progressing   Problem: Skin Integrity: Goal: Risk for impaired skin integrity will decrease Outcome: Progressing   Problem: Education: Goal: Knowledge of disease and its progression will improve Outcome: Progressing   Problem: Clinical Measurements: Goal: Complications related to the disease process or treatment will be avoided or minimized Outcome: Progressing   Problem: Activity: Goal: Activity intolerance will improve Outcome: Progressing   Problem: Fluid Volume: Goal: Fluid volume  balance will be maintained or improved Outcome: Progressing   Problem: Nutritional: Goal: Ability to make appropriate dietary choices will improve Outcome: Progressing   Problem: Respiratory: Goal: Respiratory symptoms related to disease process will be avoided Outcome: Progressing   Problem: Urinary Elimination: Goal: Progression of disease will be identified and treated Outcome: Progressing

## 2024-04-02 NOTE — Progress Notes (Addendum)
  [facility] Progress Note   Subjective:    Creat down to 4.3 today UOP 1100 yesterday K+ low at 2.9, replace Feeling a lot better, more energy    Objective:   BP (!) 161/67 (BP Location: Right Arm)   Pulse (!) 52   Temp 97.8 F (36.6 C)   Resp 18   Ht 5' 2.99 (1.6 m)   Wt 76.8 kg   SpO2 95%   BMI 30.00 kg/m    Physical Exam: GEN lying flat in bed, NAD HEENT EOMI PERRL NECK flat neck veins PULM clear CV RRR ABD soft EXT no LE edema NEURO AAO x 3 nonfocal  Date   Creat   eGFR (ml/min) 2020   1.22- 2.54 2021   1.10- 1.40 2022   1.20- 1.35 2023   1.29- 3.64  13- 45 ml/min  Feb- sept '24  2.23- 2.90  20- 21 ml/min  10/24- 08/10/23 6.13 >> 3.53  7 >> 13 ml/min 12/17/23  3.03   16   01/22/24  3.15  03/27/24  7.87 6/20   7.04 6/21   6.36 6/22   6.31 03/31/24  6.00   Assessment/ Plan:    AKI on CKD 5: b/l creat 3.0- 3.5 from late 2024 and march 2025, eGFR 13- 16 ml/min. F/b Dr Dolan at [facility], every 3 mos.  AKI due to hypovolemia related to diarrhea/ GI losses. Creat here was 7.8 on admission. With cont'd IVF's creat was down 6/23 to 5.3 and creat this am is 4.3. Continues to improve, will dc IVFs. Is not on any diuretics at home. No further suggestions. Our office will contact pt for f/u visit. Will sign off.                 2.  Hyponatremia:             - resolved   3.  Metabolic acidosis:             - CO2 25, resolved   4.  Anemia             - not an ESA candidate d/t malignancy  - getting 1 u pRBCS today  - anemia workup ongoing   5.  Diarrhea:             - improving   6.  Dispo  - per coralee Myer Fret  MD  [facility] 04/02/2024, 9:00 AM  Recent Labs  Lab 03/27/24 1644 03/27/24 2345 03/28/24 0529 03/29/24 0827 03/31/24 0544 04/01/24 0530 04/02/24 0532  HGB 8.9*   < > 7.8*   < > 8.4*  --  8.2*  ALBUMIN  2.9*  --  2.5*  --   --   --   --   CALCIUM  8.4*   < > 7.9*   < > 8.2* 8.0* 7.4*  CREATININE 7.87*   < > 7.04*   < > 6.00*  5.34* 4.31*  K 3.9   < > 3.9   < > 4.6 4.1 2.9*   < > = values in this interval not displayed.    Inpatient medications:  carvedilol   6.25 mg Oral BID WC   famotidine   20 mg Oral Daily   ferrous sulfate   325 mg Oral Daily   fluconazole   100 mg Oral Daily   isosorbide  mononitrate  15 mg Oral Daily   potassium chloride   40 mEq Oral BID   rosuvastatin   10 mg Oral Daily   senna-docusate  2 tablet Oral BID   sertraline   50 mg Oral q morning   traZODone   150 mg Oral QHS    sodium bicarbonate  150 mEq in sterile water  1,150 mL infusion 100 mL/hr at 04/02/24 0011   acetaminophen  **OR** acetaminophen , albuterol , hydrALAZINE , ondansetron  **OR** ondansetron  (ZOFRAN ) IV, mouth rinse

## 2024-04-03 ENCOUNTER — Other Ambulatory Visit (HOSPITAL_COMMUNITY): Payer: Self-pay

## 2024-04-03 DIAGNOSIS — N179 Acute kidney failure, unspecified: Secondary | ICD-10-CM | POA: Diagnosis not present

## 2024-04-03 DIAGNOSIS — B3749 Other urogenital candidiasis: Secondary | ICD-10-CM

## 2024-04-03 DIAGNOSIS — E782 Mixed hyperlipidemia: Secondary | ICD-10-CM | POA: Diagnosis not present

## 2024-04-03 DIAGNOSIS — I5020 Unspecified systolic (congestive) heart failure: Secondary | ICD-10-CM

## 2024-04-03 DIAGNOSIS — E876 Hypokalemia: Secondary | ICD-10-CM

## 2024-04-03 DIAGNOSIS — I10 Essential (primary) hypertension: Secondary | ICD-10-CM

## 2024-04-03 LAB — BASIC METABOLIC PANEL WITH GFR
Anion gap: 12 (ref 5–15)
BUN: 41 mg/dL — ABNORMAL HIGH (ref 8–23)
CO2: 32 mmol/L (ref 22–32)
Calcium: 7.4 mg/dL — ABNORMAL LOW (ref 8.9–10.3)
Chloride: 95 mmol/L — ABNORMAL LOW (ref 98–111)
Creatinine, Ser: 3.48 mg/dL — ABNORMAL HIGH (ref 0.44–1.00)
GFR, Estimated: 13 mL/min — ABNORMAL LOW (ref >60)
Glucose, Bld: 92 mg/dL (ref 70–99)
Potassium: 3.7 mmol/L (ref 3.5–5.1)
Sodium: 139 mmol/L (ref 135–145)

## 2024-04-03 LAB — CBC
HCT: 25.9 % — ABNORMAL LOW (ref 36.0–46.0)
Hemoglobin: 8.2 g/dL — ABNORMAL LOW (ref 12.0–15.0)
MCH: 28.7 pg (ref 26.0–34.0)
MCHC: 31.7 g/dL (ref 30.0–36.0)
MCV: 90.6 fL (ref 80.0–100.0)
Platelets: 256 10*3/uL (ref 150–400)
RBC: 2.86 MIL/uL — ABNORMAL LOW (ref 3.87–5.11)
RDW: 15.7 % — ABNORMAL HIGH (ref 11.5–15.5)
WBC: 6.7 10*3/uL (ref 4.0–10.5)
nRBC: 0 % (ref 0.0–0.2)

## 2024-04-03 MED ORDER — FLUCONAZOLE 100 MG PO TABS
100.0000 mg | ORAL_TABLET | Freq: Every day | ORAL | 0 refills | Status: DC
  Filled 2024-04-03: qty 5, 5d supply, fill #0

## 2024-04-03 NOTE — Progress Notes (Signed)
 Discharged were reviewed with the patient. She denied questions, or concerns at this time. IV removed, site is clean dry and intact. Telemetry#[id number] returned to desk. Pt aware, awaiting delivery of meds from [facility] pharmacy before DC is complete.

## 2024-04-03 NOTE — Progress Notes (Signed)
 Discharge medication delivered to bedside [facility]

## 2024-04-03 NOTE — Discharge Summary (Signed)
 Physician Discharge Summary   Patient: [name] MRN: [id number] DOB: 06/06/1946  Admit date:     03/27/2024  Discharge date: 04/03/2024  Discharge Physician: Concepcion Riser   PCP: Chrystal Lamarr RAMAN, MD   Recommendations at discharge:    PCP follow up in 1 week. Nephrology follow up as scheduled.  Discharge Diagnoses: Principal Problem:   Acute kidney injury superimposed on stage 4 chronic kidney disease (HCC) Active Problems:   Essential hypertension   Mixed hyperlipidemia   Hypokalemia   HFrEF (heart failure with reduced ejection fraction) (HCC)   Hyponatremia   Yeast UTI   Acute renal failure (HCC)  Resolved Problems:   * No resolved hospital problems. *  Hospital Course: [name] is a 78 y.o. female with medical history significant of HLD, Hx of PE,HTN,CAD non obstructive CAD, OSA not on CPAP, bladder cancer s/p right nephroureterectomy for cancer with last chemo treatment 2 mo ago ,anemia,GERD, CHF pef ef 63%,, hx of PE 2022, CKD IV with progression followed by nephrology most recent labs 6/10 noted cr of 3.3, patient presents to ED with diffuse abdominal pain associated with n/v/d x2-3 weeks.    In ED creatinine 7.87, CT abdomen/ pelvis showed no acute findings though suspicious nodal mets admitted to hospitalist service for further management of acute on CKD stage 4, Nephrology consulted, advised gentle IV fluids, no HD need. Her kidney function improved, does not have any more diarrhea. She is advised to follow up with PCP, nephrology as scheduled.  Assessment and Plan: Acute on CKD stage IV: Due to poor po intake, severe diarrhea since 3 weeks. She is eating fair now, diarrhea improved. Nephrology follow up appreciated, she is making urine, no urgent need for HD at this time. Kidney function improved and back to baseline with fluids.  She did get 1U PRBC transfused 03/30/24. Advised PCP follow up and nephrology follow up with repeat labs as  scheduled.   Yeast UTI- Prescribed Diflucan  to finish total 10 days.   Hyponatremia- Due to hypovolemia, dehydration. Sodium stable.   Acute on chronic anemia- Hb stable from 6.7 03/30/24 s/p transfusion on 1u PRBC. Ferritin high due to anemia of chronic disease. She denies active bleeding.   Diarrhea  No more loose stools per patient. CT abd negative. Unable to get GI panel, c.diff as she is unable to provide sample.   Hx of Bladder cancer  s/p right nephroureterectomy for cancer with last chemo treatment 2 mo ago. Outpatient oncology follow up advised.   Hypertension, borderline Continue carvedilol  and imdur   prn hydralazine     CAD, nonobstructive Continue on aspirin , statin, carvedilol , imdur    OSA Not on cpap  prn O2 at bedtime.   HFpEF Compensated continue on carvedilol   EF 63%, grade II diastolic dysfunction   GERD Continue Pepcid .   HLD  Continue on crestor .       Consultants: Nephrology Procedures performed: none  Disposition: Home Diet recommendation:  Discharge Diet Orders (From admission, onward)     Start     Ordered   04/03/24 0000  Diet - low sodium heart healthy        04/03/24 0848           Cardiac diet DISCHARGE MEDICATION: Allergies as of 04/03/2024       Reactions   Erythromycin Diarrhea, Nausea And Vomiting   Lotensin [benazepril Hcl] Cough   Macrodantin [nitrofurantoin] Diarrhea   And night sweats        Medication List  STOP taking these medications    traMADol  50 MG tablet Commonly known as: Ultram        TAKE these medications    acetaminophen  500 MG tablet Commonly known as: TYLENOL  Take 500-1,000 mg by mouth every 6 (six) hours as needed for mild pain.   aspirin  EC 81 MG tablet Take 1 tablet (81 mg total) by mouth daily. Swallow whole.   carvedilol  6.25 MG tablet Commonly known as: COREG  TAKE 1 TABLET(6.25 MG) BY MOUTH TWICE DAILY WITH A MEAL   cholecalciferol  25 MCG (1000 UNIT)  tablet Commonly known as: VITAMIN D3 Take 1,000 Units by mouth every morning.   fluconazole  100 MG tablet Commonly known as: DIFLUCAN  Take 1 tablet (100 mg total) by mouth daily.   HEARTBURN RELIEF PO Take 1 tablet by mouth in the morning and at bedtime.   icosapent  Ethyl 1 g capsule Commonly known as: VASCEPA  TAKE 1 CAPSULE(1 GRAM) BY MOUTH TWICE DAILY What changed: See the new instructions.   IRON PO Take 1 tablet by mouth in the morning.   isosorbide  mononitrate 30 MG 24 hr tablet Commonly known as: IMDUR  TAKE 1/2 TABLET(15 MG) BY MOUTH DAILY   rosuvastatin  40 MG tablet Commonly known as: CRESTOR  Take 1 tablet (40 mg total) by mouth daily.   sertraline  50 MG tablet Commonly known as: ZOLOFT  Take 50 mg by mouth every morning.   traZODone  150 MG tablet Commonly known as: DESYREL  Take 150 mg by mouth at bedtime.        Discharge Exam: Filed Weights   03/27/24 1641 03/27/24 1642 03/28/24 0016  Weight: 79.4 kg 74.4 kg 76.8 kg      04/03/2024    7:45 AM 04/03/2024    4:53 AM 04/02/2024    9:53 PM  Vitals with BMI  Systolic 156 148 851  Diastolic 62 67 89  Pulse 63 72 67   General - Elderly Caucasian female, no respiratory distress HEENT - PERRLA, EOMI, atraumatic head, non tender sinuses. Lung - Clear, basal rales, rhonchi, no wheezes. Heart - S1, S2 heard, no murmurs, rubs, no pedal edema. Abdomen - Soft, non tender, bowel sounds good Neuro - Alert, awake and oriented x 3, non focal exam. Skin - Warm and dry.   Condition at discharge: stable  The results of significant diagnostics from this hospitalization (including imaging, microbiology, ancillary and laboratory) are listed below for reference.   Imaging Studies: CT ABDOMEN PELVIS WO CONTRAST Result Date: 03/27/2024 EXAM: CT ABDOMEN AND PELVIS WITHOUT CONTRAST 03/27/2024 09:18:00 PM TECHNIQUE: CT of the abdomen and pelvis was performed without the administration of intravenous contrast. Multiplanar  reformatted images are provided for review. Automated exposure control, iterative reconstruction, and/or weight based adjustment of the mA/kV was utilized to reduce the radiation dose to as low as reasonably achievable. COMPARISON: None available. CLINICAL HISTORY: Abdominal pain, acute, nonlocalized. Pt c/o LLQ abdominal pain and n/v/d x3 weeks. Pain score 10/10. Pt has not taken anything for symptoms. Pt hasn't had any cancer treatments or interventions in several months. FINDINGS: LOWER CHEST: Tiny hiatal hernia. LIVER: The liver is unremarkable. GALLBLADDER AND BILE DUCTS: Gallbladder is unremarkable. No biliary ductal dilatation. SPLEEN: No acute abnormality. PANCREAS: No acute abnormality. ADRENAL GLANDS: No acute abnormality. KIDNEYS, URETERS AND BLADDER: Status post right nephroureterectomy. Left double-pigtail ureteral stent in satisfactory position. No hydronephrosis. No perinephric or periureteral stranding. Urinary bladder is unremarkable. GI AND BOWEL: Sigmoid diverticulosis, without evidence of diverticulitis. Normal appendix (image 53). There is no bowel obstruction. No  bowel wall thickening. PERITONEUM AND RETROPERITONEUM: No ascites. No free air. VASCULATURE: Atherosclerotic calcifications of the abdominal aorta and branch vessels. Progressive left periaortic nodes measuring up to 10 mm short axis (image 42), previously 6 mm. LYMPH NODES: Progressive left periaortic nodes measuring up to 10 mm short axis (image 42), previously 6 mm. REPRODUCTIVE ORGANS: No acute abnormality. BONES AND SOFT TISSUES: No acute osseous abnormality. No focal soft tissue abnormality. IMPRESSION: 1. Left ureteral stent in satisfactory position. 2. Status post right nephroureterectomy. 3. Progressive left periaortic nodes measuring up to 10 mm short axis, suspicious for nodal metastases. Electronically signed by: Pinkie Pebbles MD 03/27/2024 09:27 PM EDT RP Workstation: [id number]    Microbiology: Results for orders  placed or performed during the hospital encounter of 03/27/24  Urine Culture (for pregnant, neutropenic or urologic patients or patients with an indwelling urinary catheter)     Status: Abnormal   Collection Time: 03/28/24  7:15 AM   Specimen: Urine, Clean Catch  Result Value Ref Range Status   Specimen Description   Final    URINE, CLEAN CATCH Performed at [facility], [address] [place], [place] [address]    Special Requests   Final    NONE Performed at [facility], [address], [place], [place] [address]    Culture 40,000 COLONIES/mL YEAST (A)  Final   Report Status 03/30/2024 FINAL  Final    Labs: CBC: Recent Labs  Lab 03/29/24 0827 03/30/24 0640 03/31/24 0544 04/02/24 0532 04/03/24 0518  WBC 6.4 7.3 11.7* 7.0 6.7  HGB 7.5* 6.7* 8.4* 8.2* 8.2*  HCT 24.1* 21.8* 26.3* 25.4* 25.9*  MCV 90.3 90.8 89.2 86.1 90.6  PLT 237 199 259 264 256   Basic Metabolic Panel: Recent Labs  Lab 03/30/24 0640 03/31/24 0544 04/01/24 0530 04/02/24 0532 04/03/24 0518  NA 133* 135 136 133* 139  K 4.1 4.6 4.1 2.9* 3.7  CL 107 109 106 95* 95*  CO2 16* 17* 20* 27 32  GLUCOSE 86 101* 99 85 92  BUN 73* 66* 66* 51* 41*  CREATININE 6.31* 6.00* 5.34* 4.31* 3.48*  CALCIUM  7.7* 8.2* 8.0* 7.4* 7.4*   Liver Function Tests: No results for input(s): AST, ALT, ALKPHOS, BILITOT, PROT, ALBUMIN  in the last 168 hours.  CBG: No results for input(s): GLUCAP in the last 168 hours.  Discharge time spent: 34 minutes.  Signed: Concepcion Riser, MD [facility] 04/04/2024

## 2024-04-08 ENCOUNTER — Other Ambulatory Visit: Payer: Self-pay | Admitting: Internal Medicine

## 2024-04-08 DIAGNOSIS — E782 Mixed hyperlipidemia: Secondary | ICD-10-CM

## 2024-04-09 ENCOUNTER — Telehealth: Payer: Self-pay | Admitting: Pharmacy Technician

## 2024-04-09 DIAGNOSIS — E7800 Pure hypercholesterolemia, unspecified: Secondary | ICD-10-CM | POA: Diagnosis not present

## 2024-04-09 DIAGNOSIS — C689 Malignant neoplasm of urinary organ, unspecified: Secondary | ICD-10-CM | POA: Diagnosis not present

## 2024-04-09 DIAGNOSIS — D649 Anemia, unspecified: Secondary | ICD-10-CM | POA: Diagnosis not present

## 2024-04-09 DIAGNOSIS — F419 Anxiety disorder, unspecified: Secondary | ICD-10-CM | POA: Diagnosis not present

## 2024-04-09 DIAGNOSIS — N185 Chronic kidney disease, stage 5: Secondary | ICD-10-CM | POA: Diagnosis not present

## 2024-04-09 NOTE — Telephone Encounter (Signed)
   I called the pharmacy and they said the vascepa  is ready for 80.00

## 2024-04-11 ENCOUNTER — Ambulatory Visit: Payer: Self-pay | Admitting: Internal Medicine

## 2024-04-21 ENCOUNTER — Other Ambulatory Visit: Payer: Self-pay | Admitting: Family Medicine

## 2024-04-21 DIAGNOSIS — Z1231 Encounter for screening mammogram for malignant neoplasm of breast: Secondary | ICD-10-CM

## 2024-04-23 ENCOUNTER — Other Ambulatory Visit (HOSPITAL_COMMUNITY): Payer: Self-pay

## 2024-04-27 ENCOUNTER — Other Ambulatory Visit: Payer: Self-pay | Admitting: Internal Medicine

## 2024-05-20 DIAGNOSIS — D649 Anemia, unspecified: Secondary | ICD-10-CM | POA: Diagnosis not present

## 2024-05-20 DIAGNOSIS — R55 Syncope and collapse: Secondary | ICD-10-CM | POA: Diagnosis not present

## 2024-05-20 DIAGNOSIS — M25572 Pain in left ankle and joints of left foot: Secondary | ICD-10-CM | POA: Diagnosis not present

## 2024-05-20 DIAGNOSIS — N185 Chronic kidney disease, stage 5: Secondary | ICD-10-CM | POA: Diagnosis not present

## 2024-05-28 ENCOUNTER — Ambulatory Visit
Admission: RE | Admit: 2024-05-28 | Discharge: 2024-05-28 | Disposition: A | Discharge status: Home / Self Care | Source: Ambulatory Visit | Attending: Family Medicine | Admitting: Family Medicine

## 2024-05-28 DIAGNOSIS — Z1231 Encounter for screening mammogram for malignant neoplasm of breast: Secondary | ICD-10-CM | POA: Diagnosis not present

## 2024-07-01 DIAGNOSIS — G4700 Insomnia, unspecified: Secondary | ICD-10-CM | POA: Diagnosis not present

## 2024-07-01 DIAGNOSIS — Z0000 Encounter for general adult medical examination without abnormal findings: Secondary | ICD-10-CM | POA: Diagnosis not present

## 2024-07-01 DIAGNOSIS — C689 Malignant neoplasm of urinary organ, unspecified: Secondary | ICD-10-CM | POA: Diagnosis not present

## 2024-07-01 DIAGNOSIS — N184 Chronic kidney disease, stage 4 (severe): Secondary | ICD-10-CM | POA: Diagnosis not present

## 2024-07-01 DIAGNOSIS — R7309 Other abnormal glucose: Secondary | ICD-10-CM | POA: Diagnosis not present

## 2024-07-01 DIAGNOSIS — I10 Essential (primary) hypertension: Secondary | ICD-10-CM | POA: Diagnosis not present

## 2024-07-01 DIAGNOSIS — E7800 Pure hypercholesterolemia, unspecified: Secondary | ICD-10-CM | POA: Diagnosis not present

## 2024-07-01 DIAGNOSIS — D649 Anemia, unspecified: Secondary | ICD-10-CM | POA: Diagnosis not present

## 2024-07-01 DIAGNOSIS — N390 Urinary tract infection, site not specified: Secondary | ICD-10-CM | POA: Diagnosis not present

## 2024-07-01 DIAGNOSIS — Z23 Encounter for immunization: Secondary | ICD-10-CM | POA: Diagnosis not present

## 2024-07-01 DIAGNOSIS — C652 Malignant neoplasm of left renal pelvis: Secondary | ICD-10-CM | POA: Diagnosis not present

## 2024-07-01 DIAGNOSIS — E559 Vitamin D deficiency, unspecified: Secondary | ICD-10-CM | POA: Diagnosis not present

## 2024-07-01 DIAGNOSIS — Z905 Acquired absence of kidney: Secondary | ICD-10-CM | POA: Diagnosis not present

## 2024-07-04 ENCOUNTER — Other Ambulatory Visit: Payer: Self-pay | Admitting: Internal Medicine

## 2024-07-04 DIAGNOSIS — E782 Mixed hyperlipidemia: Secondary | ICD-10-CM

## 2024-07-25 DIAGNOSIS — R399 Unspecified symptoms and signs involving the genitourinary system: Secondary | ICD-10-CM | POA: Diagnosis not present

## 2024-07-28 ENCOUNTER — Other Ambulatory Visit: Payer: Self-pay | Admitting: *Deleted

## 2024-07-28 ENCOUNTER — Other Ambulatory Visit

## 2024-07-28 ENCOUNTER — Ambulatory Visit: Admitting: Hematology

## 2024-07-28 DIAGNOSIS — Z905 Acquired absence of kidney: Secondary | ICD-10-CM | POA: Diagnosis not present

## 2024-07-28 DIAGNOSIS — C689 Malignant neoplasm of urinary organ, unspecified: Secondary | ICD-10-CM

## 2024-07-28 DIAGNOSIS — C652 Malignant neoplasm of left renal pelvis: Secondary | ICD-10-CM | POA: Diagnosis not present

## 2024-07-28 DIAGNOSIS — R8271 Bacteriuria: Secondary | ICD-10-CM | POA: Diagnosis not present

## 2024-07-29 ENCOUNTER — Encounter (HOSPITAL_COMMUNITY): Payer: Self-pay | Admitting: Radiology

## 2024-07-29 ENCOUNTER — Inpatient Hospital Stay (HOSPITAL_COMMUNITY)
Admission: EM | Admit: 2024-07-29 | Discharge: 2024-08-05 | DRG: 683 | Disposition: A | Discharge status: Home / Self Care | Source: Ambulatory Visit | Attending: Internal Medicine | Admitting: Internal Medicine

## 2024-07-29 ENCOUNTER — Inpatient Hospital Stay: Admitting: Hematology

## 2024-07-29 ENCOUNTER — Inpatient Hospital Stay

## 2024-07-29 ENCOUNTER — Other Ambulatory Visit: Payer: Self-pay

## 2024-07-29 ENCOUNTER — Emergency Department (HOSPITAL_COMMUNITY)

## 2024-07-29 VITALS — BP 129/61 | HR 67 | Temp 97.3°F | Resp 18 | Wt 173.5 lb

## 2024-07-29 DIAGNOSIS — E875 Hyperkalemia: Secondary | ICD-10-CM | POA: Diagnosis present

## 2024-07-29 DIAGNOSIS — Z8249 Family history of ischemic heart disease and other diseases of the circulatory system: Secondary | ICD-10-CM

## 2024-07-29 DIAGNOSIS — E782 Mixed hyperlipidemia: Secondary | ICD-10-CM | POA: Diagnosis present

## 2024-07-29 DIAGNOSIS — F32A Depression, unspecified: Secondary | ICD-10-CM | POA: Diagnosis present

## 2024-07-29 DIAGNOSIS — K219 Gastro-esophageal reflux disease without esophagitis: Secondary | ICD-10-CM | POA: Diagnosis present

## 2024-07-29 DIAGNOSIS — I5042 Chronic combined systolic (congestive) and diastolic (congestive) heart failure: Secondary | ICD-10-CM | POA: Diagnosis present

## 2024-07-29 DIAGNOSIS — I700 Atherosclerosis of aorta: Secondary | ICD-10-CM | POA: Diagnosis present

## 2024-07-29 DIAGNOSIS — D631 Anemia in chronic kidney disease: Secondary | ICD-10-CM | POA: Diagnosis present

## 2024-07-29 DIAGNOSIS — Z79899 Other long term (current) drug therapy: Secondary | ICD-10-CM | POA: Diagnosis not present

## 2024-07-29 DIAGNOSIS — I130 Hypertensive heart and chronic kidney disease with heart failure and stage 1 through stage 4 chronic kidney disease, or unspecified chronic kidney disease: Secondary | ICD-10-CM | POA: Diagnosis present

## 2024-07-29 DIAGNOSIS — Z833 Family history of diabetes mellitus: Secondary | ICD-10-CM

## 2024-07-29 DIAGNOSIS — Z905 Acquired absence of kidney: Secondary | ICD-10-CM | POA: Diagnosis not present

## 2024-07-29 DIAGNOSIS — C689 Malignant neoplasm of urinary organ, unspecified: Secondary | ICD-10-CM

## 2024-07-29 DIAGNOSIS — B952 Enterococcus as the cause of diseases classified elsewhere: Secondary | ICD-10-CM | POA: Diagnosis not present

## 2024-07-29 DIAGNOSIS — N3000 Acute cystitis without hematuria: Secondary | ICD-10-CM | POA: Diagnosis not present

## 2024-07-29 DIAGNOSIS — K5730 Diverticulosis of large intestine without perforation or abscess without bleeding: Secondary | ICD-10-CM | POA: Diagnosis not present

## 2024-07-29 DIAGNOSIS — I5020 Unspecified systolic (congestive) heart failure: Secondary | ICD-10-CM | POA: Diagnosis not present

## 2024-07-29 DIAGNOSIS — Z7982 Long term (current) use of aspirin: Secondary | ICD-10-CM

## 2024-07-29 DIAGNOSIS — Z8554 Personal history of malignant neoplasm of ureter: Secondary | ICD-10-CM

## 2024-07-29 DIAGNOSIS — Z87891 Personal history of nicotine dependence: Secondary | ICD-10-CM

## 2024-07-29 DIAGNOSIS — E872 Acidosis, unspecified: Secondary | ICD-10-CM | POA: Diagnosis present

## 2024-07-29 DIAGNOSIS — Z8551 Personal history of malignant neoplasm of bladder: Secondary | ICD-10-CM

## 2024-07-29 DIAGNOSIS — C662 Malignant neoplasm of left ureter: Secondary | ICD-10-CM | POA: Diagnosis not present

## 2024-07-29 DIAGNOSIS — C652 Malignant neoplasm of left renal pelvis: Secondary | ICD-10-CM | POA: Diagnosis present

## 2024-07-29 DIAGNOSIS — K449 Diaphragmatic hernia without obstruction or gangrene: Secondary | ICD-10-CM | POA: Diagnosis not present

## 2024-07-29 DIAGNOSIS — Z87442 Personal history of urinary calculi: Secondary | ICD-10-CM

## 2024-07-29 DIAGNOSIS — Z961 Presence of intraocular lens: Secondary | ICD-10-CM | POA: Diagnosis present

## 2024-07-29 DIAGNOSIS — Z9842 Cataract extraction status, left eye: Secondary | ICD-10-CM

## 2024-07-29 DIAGNOSIS — Z86711 Personal history of pulmonary embolism: Secondary | ICD-10-CM

## 2024-07-29 DIAGNOSIS — D649 Anemia, unspecified: Secondary | ICD-10-CM | POA: Diagnosis present

## 2024-07-29 DIAGNOSIS — N179 Acute kidney failure, unspecified: Principal | ICD-10-CM | POA: Diagnosis present

## 2024-07-29 DIAGNOSIS — N184 Chronic kidney disease, stage 4 (severe): Secondary | ICD-10-CM | POA: Diagnosis not present

## 2024-07-29 DIAGNOSIS — G4733 Obstructive sleep apnea (adult) (pediatric): Secondary | ICD-10-CM | POA: Diagnosis present

## 2024-07-29 DIAGNOSIS — Z90710 Acquired absence of both cervix and uterus: Secondary | ICD-10-CM

## 2024-07-29 DIAGNOSIS — Z9841 Cataract extraction status, right eye: Secondary | ICD-10-CM

## 2024-07-29 DIAGNOSIS — I10 Essential (primary) hypertension: Secondary | ICD-10-CM | POA: Diagnosis present

## 2024-07-29 DIAGNOSIS — Z960 Presence of urogenital implants: Secondary | ICD-10-CM | POA: Diagnosis not present

## 2024-07-29 DIAGNOSIS — I2510 Atherosclerotic heart disease of native coronary artery without angina pectoris: Secondary | ICD-10-CM | POA: Diagnosis not present

## 2024-07-29 LAB — CMP (CANCER CENTER ONLY)
ALT: 20 U/L (ref 0–44)
AST: 17 U/L (ref 15–41)
Albumin: 3.5 g/dL (ref 3.5–5.0)
Alkaline Phosphatase: 61 U/L (ref 38–126)
Anion gap: 10 (ref 5–15)
BUN: 84 mg/dL — ABNORMAL HIGH (ref 8–23)
CO2: 21 mmol/L — ABNORMAL LOW (ref 22–32)
Calcium: 9.7 mg/dL (ref 8.9–10.3)
Chloride: 106 mmol/L (ref 98–111)
Creatinine: 6.45 mg/dL — ABNORMAL HIGH (ref 0.44–1.00)
GFR, Estimated: 6 mL/min — ABNORMAL LOW (ref >60)
Glucose, Bld: 140 mg/dL — ABNORMAL HIGH (ref 70–99)
Potassium: 4.5 mmol/L (ref 3.5–5.1)
Sodium: 137 mmol/L (ref 135–145)
Total Bilirubin: 0.2 mg/dL (ref 0.0–1.2)
Total Protein: 8.1 g/dL (ref 6.5–8.1)

## 2024-07-29 LAB — CBC WITH DIFFERENTIAL (CANCER CENTER ONLY)
Abs Immature Granulocytes: 0.03 K/uL (ref 0.00–0.07)
Basophils Absolute: 0.1 K/uL (ref 0.0–0.1)
Basophils Relative: 1 %
Eosinophils Absolute: 0.4 K/uL (ref 0.0–0.5)
Eosinophils Relative: 4 %
HCT: 30.9 % — ABNORMAL LOW (ref 36.0–46.0)
Hemoglobin: 9.9 g/dL — ABNORMAL LOW (ref 12.0–15.0)
Immature Granulocytes: 0 %
Lymphocytes Relative: 23 %
Lymphs Abs: 2.2 K/uL (ref 0.7–4.0)
MCH: 29.7 pg (ref 26.0–34.0)
MCHC: 32 g/dL (ref 30.0–36.0)
MCV: 92.8 fL (ref 80.0–100.0)
Monocytes Absolute: 0.6 K/uL (ref 0.1–1.0)
Monocytes Relative: 7 %
Neutro Abs: 6 K/uL (ref 1.7–7.7)
Neutrophils Relative %: 65 %
Platelet Count: 254 K/uL (ref 150–400)
RBC: 3.33 MIL/uL — ABNORMAL LOW (ref 3.87–5.11)
RDW: 13.6 % (ref 11.5–15.5)
WBC Count: 9.3 K/uL (ref 4.0–10.5)
nRBC: 0 % (ref 0.0–0.2)

## 2024-07-29 LAB — URINALYSIS, W/ REFLEX TO CULTURE (INFECTION SUSPECTED)
Bilirubin Urine: NEGATIVE
Glucose, UA: NEGATIVE mg/dL
Ketones, ur: NEGATIVE mg/dL
Nitrite: NEGATIVE
Protein, ur: 30 mg/dL — AB
RBC / HPF: >50 RBC/hpf (ref 0–5)
Specific Gravity, Urine: 1.01 (ref 1.005–1.030)
WBC, UA: >50 WBC/hpf (ref 0–5)
pH: 5 (ref 5.0–8.0)

## 2024-07-29 LAB — BASIC METABOLIC PANEL WITH GFR
Anion gap: 14 (ref 5–15)
BUN: 79 mg/dL — ABNORMAL HIGH (ref 8–23)
CO2: 16 mmol/L — ABNORMAL LOW (ref 22–32)
Calcium: 8.8 mg/dL — ABNORMAL LOW (ref 8.9–10.3)
Chloride: 106 mmol/L (ref 98–111)
Creatinine, Ser: 5.77 mg/dL — ABNORMAL HIGH (ref 0.44–1.00)
GFR, Estimated: 7 mL/min — ABNORMAL LOW (ref >60)
Glucose, Bld: 86 mg/dL (ref 70–99)
Potassium: 4.9 mmol/L (ref 3.5–5.1)
Sodium: 136 mmol/L (ref 135–145)

## 2024-07-29 LAB — I-STAT CG4 LACTIC ACID, ED: Lactic Acid, Venous: 0.6 mmol/L (ref 0.5–1.9)

## 2024-07-29 MED ORDER — ACETAMINOPHEN 500 MG PO TABS
500.0000 mg | ORAL_TABLET | Freq: Four times a day (QID) | ORAL | Status: DC | PRN
  Administered 2024-08-03: 1000 mg via ORAL
  Administered 2024-08-05: 500 mg via ORAL
  Filled 2024-07-29: qty 2
  Filled 2024-07-29: qty 1

## 2024-07-29 MED ORDER — SENNOSIDES-DOCUSATE SODIUM 8.6-50 MG PO TABS
1.0000 | ORAL_TABLET | Freq: Every evening | ORAL | Status: DC | PRN
  Administered 2024-07-31 – 2024-08-04 (×2): 1 via ORAL
  Filled 2024-07-29 (×2): qty 1

## 2024-07-29 MED ORDER — ASPIRIN 81 MG PO TBEC
81.0000 mg | DELAYED_RELEASE_TABLET | Freq: Every day | ORAL | Status: DC
  Administered 2024-07-30 – 2024-08-05 (×7): 81 mg via ORAL
  Filled 2024-07-29 (×7): qty 1

## 2024-07-29 MED ORDER — SODIUM CHLORIDE 0.9 % IV SOLN
1.0000 g | Freq: Once | INTRAVENOUS | Status: AC
  Administered 2024-07-29: 1 g via INTRAVENOUS
  Filled 2024-07-29: qty 10

## 2024-07-29 MED ORDER — ICOSAPENT ETHYL 1 G PO CAPS
1.0000 g | ORAL_CAPSULE | Freq: Two times a day (BID) | ORAL | Status: DC
  Administered 2024-07-29 – 2024-08-05 (×14): 1 g via ORAL
  Filled 2024-07-29 (×14): qty 1

## 2024-07-29 MED ORDER — TRAZODONE HCL 100 MG PO TABS
150.0000 mg | ORAL_TABLET | Freq: Every day | ORAL | Status: DC
  Administered 2024-07-29 – 2024-08-04 (×7): 150 mg via ORAL
  Filled 2024-07-29 (×7): qty 1

## 2024-07-29 MED ORDER — SODIUM CHLORIDE 0.9 % IV SOLN: 1.0000 g | INTRAVENOUS | Status: DC

## 2024-07-29 MED ORDER — ROSUVASTATIN CALCIUM 20 MG PO TABS
40.0000 mg | ORAL_TABLET | Freq: Every day | ORAL | Status: DC
  Administered 2024-07-30: 40 mg via ORAL
  Filled 2024-07-29: qty 2

## 2024-07-29 MED ORDER — ORAL CARE MOUTH RINSE: 15.0000 mL | OROMUCOSAL | Status: DC | PRN

## 2024-07-29 MED ORDER — ONDANSETRON HCL 4 MG/2ML IJ SOLN
4.0000 mg | Freq: Once | INTRAMUSCULAR | Status: AC
  Administered 2024-07-29: 4 mg via INTRAVENOUS
  Filled 2024-07-29: qty 2

## 2024-07-29 MED ORDER — ISOSORBIDE MONONITRATE ER 30 MG PO TB24
15.0000 mg | ORAL_TABLET | Freq: Every day | ORAL | Status: DC
  Administered 2024-07-29 – 2024-08-05 (×8): 15 mg via ORAL
  Filled 2024-07-29 (×8): qty 1

## 2024-07-29 MED ORDER — ONDANSETRON HCL 4 MG PO TABS: 4.0000 mg | ORAL_TABLET | Freq: Four times a day (QID) | ORAL | Status: DC | PRN

## 2024-07-29 MED ORDER — SODIUM CHLORIDE 0.9 % IV SOLN: INTRAVENOUS | Status: DC

## 2024-07-29 MED ORDER — LACTATED RINGERS IV BOLUS
1000.0000 mL | Freq: Once | INTRAVENOUS | Status: AC
  Administered 2024-07-29: 1000 mL via INTRAVENOUS

## 2024-07-29 MED ORDER — CARVEDILOL 6.25 MG PO TABS
6.2500 mg | ORAL_TABLET | Freq: Two times a day (BID) | ORAL | Status: DC
  Administered 2024-07-30 – 2024-08-05 (×13): 6.25 mg via ORAL
  Filled 2024-07-29 (×13): qty 1

## 2024-07-29 MED ORDER — HYDRALAZINE HCL 20 MG/ML IJ SOLN
10.0000 mg | Freq: Four times a day (QID) | INTRAMUSCULAR | Status: DC | PRN
  Administered 2024-08-03 – 2024-08-05 (×3): 10 mg via INTRAVENOUS
  Filled 2024-07-29 (×3): qty 1

## 2024-07-29 MED ORDER — SERTRALINE HCL 50 MG PO TABS
50.0000 mg | ORAL_TABLET | Freq: Every morning | ORAL | Status: DC
  Administered 2024-07-30 – 2024-08-05 (×7): 50 mg via ORAL
  Filled 2024-07-29 (×7): qty 1

## 2024-07-29 MED ORDER — ONDANSETRON HCL 4 MG/2ML IJ SOLN
4.0000 mg | Freq: Four times a day (QID) | INTRAMUSCULAR | Status: DC | PRN
  Administered 2024-07-31: 4 mg via INTRAVENOUS
  Filled 2024-07-29: qty 2

## 2024-07-29 MED ORDER — VITAMIN D 25 MCG (1000 UNIT) PO TABS
1000.0000 [IU] | ORAL_TABLET | Freq: Every morning | ORAL | Status: DC
  Administered 2024-07-30 – 2024-08-05 (×7): 1000 [IU] via ORAL
  Filled 2024-07-29 (×7): qty 1

## 2024-07-29 MED ORDER — HEPARIN SODIUM (PORCINE) 5000 UNIT/ML IJ SOLN
5000.0000 [IU] | Freq: Three times a day (TID) | INTRAMUSCULAR | Status: DC
  Administered 2024-07-29 – 2024-08-05 (×21): 5000 [IU] via SUBCUTANEOUS
  Filled 2024-07-29 (×21): qty 1

## 2024-07-29 MED ORDER — FAMOTIDINE 20 MG PO TABS
40.0000 mg | ORAL_TABLET | Freq: Every day | ORAL | Status: DC
  Administered 2024-07-30: 40 mg via ORAL
  Filled 2024-07-29: qty 2

## 2024-07-29 NOTE — H&P (Signed)
 History and Physical    Patient: [name] FMW:[id number] DOB: 1946/09/08 DOA: 07/29/2024 DOS: the patient was seen and examined on 07/29/2024 PCP: Chrystal Lamarr RAMAN, MD  Patient coming from: Home  Chief Complaint:  Chief Complaint  Patient presents with   Abnormal Labs   HPI: [name] is a 78 y.o. female with medical history significant of HLD, Hx of PE,HTN,CAD non obstructive CAD, OSA not on CPAP, bladder cancer s/p right nephroureterectomy for cancer ,anemia,GERD, CHF pef ef 63%,, hx of PE 2022, CKD IV with progression followed by nephrology with baseline Scr 3.3 as of 03/2024 patient presents to ED  with episodes of  n/v/d x 1 week duration. She also reports fatigue, poor appetite and associated chills. She denies fevers.She was seen by her oncologist today and labs showed Scr of 6.45 repeat at ER was 5.77. She follows closely with Urology and had planned for stent exchange at some point. UA showed many bacteria, large leucocytes and proteinuria. CT abdome/pelvis pending, however discussed with Urology who read the CT imaging and there is no indication for stent exchange for now.   Review of Systems: As mentioned in the history of present illness. All other systems reviewed and are negative. Past Medical History:  Diagnosis Date   Anemia associated with chronic renal failure    Arthritis    Blood dyscrasia 2008   hx of PE   CAD in native artery 10/2019   cardiologist-   dr christella. santo;   a. 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   urologist--- dr dahlstedt/  oncologist--- dr amadeo;   dx 09/ 2020 high grade urothelial  s/p laser ablation, ;  recurrent s/p BCG instillation,  completed chemo instilation 11/ 2022  and repeat chemo completed 05/ 2023   Chronic combined systolic and diastolic CHF (congestive heart failure) (HCC) 06/2019   followed by  cardiology;   dx 09/ 2022 in setting of sepsis, pulm edema;   05/ 2022  ef 40-45% per echo,  recovered per cath 01/ 2021 ef 50%;  laste echo 08/ 2023  ef 60-65%   CKD (chronic kidney disease), stage III (HCC)    Depression    Dysrhythmia    bradycardia   GAD (generalized anxiety disorder)    GERD (gastroesophageal reflux disease)    History of bladder cancer 12/2006   followed by dr matilda   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   dx 04/ 2008  w/ poor function hydronephrotic  right kidney;   02-06-2007  s/p right nephroureterectomy   History of kidney stones    History of pulmonary embolism 04/2022   in setting severe sepsis;  small RLL, treated w/ 3 months eliquis    Hyperlipidemia, mixed    Hypertension    Solitary kidney, acquired 02/06/2007   s/p  right nephroureterectomy for cancer   Past Surgical History:  Procedure Laterality Date   CATARACT EXTRACTION W/ INTRAOCULAR LENS IMPLANT Bilateral 2011   CYSTOSCOPY W/ RETROGRADES Left 01/30/2024   Procedure: CYSTOSCOPY, WITH RETROGRADE PYELOGRAM;  Surgeon: Alvaro Ricardo KATHEE Mickey., MD;  Location: [facility];  Service: Urology;  Laterality: Left;   CYSTOSCOPY W/ URETERAL STENT PLACEMENT Left 11/17/2019   Procedure: CYSTOSCOPY WITH STENT REPLACEMENT;  Surgeon: matilda Senior, MD;  Location: [facility];  Service: Urology;  Laterality: Left;   CYSTOSCOPY W/ URETERAL STENT PLACEMENT Left 03/23/2022   Procedure:  CYSTOSCOPY WITH RETROGRADE PYELOGRAM, LEFT URETERAL STENT PLACEMENT;  Surgeon: Matilda Senior, MD;  Location: [facility];  Service: Urology;  Laterality: Left;   CYSTOSCOPY W/ URETERAL STENT PLACEMENT Left 06/09/2022   Procedure: CYSTOSCOPY WITH LEFT STENT EXCHANGE;  Surgeon: Matilda Senior, MD;  Location: [facility];  Service: Urology;  Laterality: Left;   CYSTOSCOPY W/ URETERAL STENT PLACEMENT Left 12/04/2022   Procedure: CYSTOSCOPY WITH STENT REPLACEMENT;  Surgeon: Matilda Senior, MD;  Location:  [facility];  Service: Urology;  Laterality: Left;   CYSTOSCOPY W/ URETERAL STENT REMOVAL Left 05/10/2020   Procedure: CYSTOSCOPY WITH STENT REMOVAL;  Surgeon: Matilda Senior, MD;  Location: [facility];  Service: Urology;  Laterality: Left;   CYSTOSCOPY W/ URETERAL STENT REMOVAL Left 05/02/2021   Procedure: CYSTOSCOPY WITH STENT REMOVAL;  Surgeon: Matilda Senior, MD;  Location: [facility];  Service: Urology;  Laterality: Left;   CYSTOSCOPY W/ URETERAL STENT REMOVAL Left 11/16/2022   Procedure: CYSTOSCOPY WITH STENT REMOVAL;  Surgeon: Matilda Senior, MD;  Location: [facility];  Service: Urology;  Laterality: Left;   CYSTOSCOPY WITH RETROGRADE PYELOGRAM, URETEROSCOPY AND STENT PLACEMENT Left 06/27/2019   Procedure: CYSTOSCOPY WITH LEFT RETROGRADE PYELOGRAM, URETEROSCOPY, BIOPSY AND LEFT STENT PLACEMENT;  Surgeon: Watt Rush, MD;  Location: [facility];  Service: Urology;  Laterality: Left;   CYSTOSCOPY WITH RETROGRADE PYELOGRAM, URETEROSCOPY AND STENT PLACEMENT Left 08/18/2019   Procedure: CYSTOSCOPY, URETEROSCOPY AND STENT EXCHANGE;  Surgeon: Matilda Senior, MD;  Location: [facility];  Service: Urology;  Laterality: Left;  90 MINS   CYSTOSCOPY WITH RETROGRADE PYELOGRAM, URETEROSCOPY AND STENT PLACEMENT Left 11/17/2019   Procedure: CYSTOSCOPY WITH RETROGRADE PYELOGRAM, URETEROSCOPY AND STENT PLACEMENT;  Surgeon: Matilda Senior, MD;  Location: [facility];  Service: Urology;  Laterality: Left;  90 MINS   CYSTOSCOPY WITH RETROGRADE PYELOGRAM, URETEROSCOPY AND STENT PLACEMENT Left 05/10/2020   Procedure: CYSTOSCOPY WITH RETROGRADE PYELOGRAM, URETEROSCOPY AND STENT PLACEMENT WITH URETHRAL DIALATION AND BRUSH BIOPSY;  Surgeon: Matilda Senior, MD;  Location: [facility];  Service: Urology;  Laterality: Left;  1 HR   CYSTOSCOPY WITH RETROGRADE PYELOGRAM, URETEROSCOPY AND STENT PLACEMENT Left 09/16/2020    Procedure: CYSTOSCOPY WITH RETROGRADE PYELOGRAM, URETEROSCOPY ,  LITHOPAXY, LEFT URETERAL BRUSHING, AND STENT REPLACEMENT;  Surgeon: Matilda Senior, MD;  Location: [facility];  Service: Urology;  Laterality: Left;   CYSTOSCOPY WITH RETROGRADE PYELOGRAM, URETEROSCOPY AND STENT PLACEMENT Left 10/24/2021   Procedure: CYSTOSCOPY WITH RETROGRADE PYELOGRAM, URETEROSCOPY, POSSIBLE URETERAL AND RENAL BIOPSIES AND STENT PLACEMENT;  Surgeon: Matilda Senior, MD;  Location: [facility];  Service: Urology;  Laterality: Left;  1 HR   CYSTOSCOPY WITH RETROGRADE PYELOGRAM, URETEROSCOPY AND STENT PLACEMENT Left 03/09/2022   Procedure: CYSTOSCOPY WITH ANTEGRADE PYELOGRAM,  STENT REMOVAL;  Surgeon: Matilda Senior, MD;  Location: [facility];  Service: Urology;  Laterality: Left;   CYSTOSCOPY WITH RETROGRADE PYELOGRAM, URETEROSCOPY AND STENT PLACEMENT  01/09/2007   @[facility]  by dr matilda;    bx's/ washing  bladder , right ureter   CYSTOSCOPY WITH RETROGRADE PYELOGRAM, URETEROSCOPY AND STENT PLACEMENT Left 11/16/2022   Procedure: CYSTOSCOPY WITH RETROGRADE PYELOGRAM, URETEROSCOPY AND STENT REPLACEMENT;  Surgeon: Matilda Senior, MD;  Location: [facility];  Service: Urology;  Laterality: Left;  90 MINS   CYSTOSCOPY WITH RETROGRADE PYELOGRAM, URETEROSCOPY AND STENT PLACEMENT Left 07/06/2023   Procedure: CYSTOSCOPY WITH LEFT RETROGRADE PYELOGRAM, URETEROSCOPY, LASER ABLATION OF TUMOR AND STENT EXCHANGE;  Surgeon: Alvaro Ricardo KATHEE Mickey.,  MD;  Location: [facility];  Service: Urology;  Laterality: Left;  60 MINUTES NEEDED FOR CASE   CYSTOSCOPY/URETEROSCOPY/HOLMIUM LASER Left 12/04/2022   Procedure: CYSTOSCOPY/URETEROSCOPY/  HOLMIUM LASER OF RENAL PELVIC LESIONS;  Surgeon: Matilda Senior, MD;  Location: [facility];  Service: Urology;  Laterality: Left;   CYSTOSCOPY/URETEROSCOPY/HOLMIUM LASER/STENT PLACEMENT Left 05/02/2021   Procedure:  CYSTOSCOPY/URETEROSCOPY WITH BRUSH BIOPSY/ RETROGRADE PYELOGRAM/ HOLMIUM LASER/STENT REPLACEMENT;  Surgeon: Matilda Senior, MD;  Location: [facility];  Service: Urology;  Laterality: Left;   CYSTOSCOPY/URETEROSCOPY/HOLMIUM LASER/STENT PLACEMENT Left 01/30/2024   Procedure: CYSTOSCOPY/DIAGNOSTIC URETEROSCOPY/ RETROGRADE/STENT EXCHANGE;  Surgeon: Alvaro Ricardo KATHEE Mickey., MD;  Location: [facility];  Service: Urology;  Laterality: Left;   HOLMIUM LASER APPLICATION Left 09/16/2020   Procedure: HOLMIUM LASER APPLICATION;  Surgeon: Matilda Senior, MD;  Location: [facility];  Service: Urology;  Laterality: Left;   HOLMIUM LASER APPLICATION Left 10/24/2021   Procedure: HOLMIUM LASER APPLICATION OF TUMORS;  Surgeon: Matilda Senior, MD;  Location: [facility];  Service: Urology;  Laterality: Left;   HOLMIUM LASER APPLICATION Left 11/16/2022   Procedure: HOLMIUM LASER OF TUMORS;  Surgeon: Matilda Senior, MD;  Location: [facility];  Service: Urology;  Laterality: Left;   IR NEPHROSTOMY PLACEMENT LEFT  07/06/2021   IR NEPHROSTOMY PLACEMENT LEFT  02/06/2022   NEPHROSTOMY TUBE REMOVAL Left 03/23/2022   Procedure: NEPHROSTOMY TUBE REMOVAL;  Surgeon: Matilda Senior, MD;  Location: [facility];  Service: Urology;  Laterality: Left;   NEPHROURETERECTOMY Right 02/06/2007   @[facility]  by dr matilda;   Laparoscopic   RIGHT/LEFT HEART CATH AND CORONARY ANGIOGRAPHY N/A 11/06/2019   Procedure: RIGHT/LEFT HEART CATH AND CORONARY ANGIOGRAPHY;  Surgeon: Claudene Victory ORN, MD;  Location: [facility];  Service: Cardiovascular;  Laterality: N/A;   THULIUM LASER TURP (TRANSURETHRAL RESECTION OF PROSTATE) Left 08/18/2019   Procedure: THULIUM LASER ABLATION OF URETERAL TUMOR;  Surgeon: Matilda Senior, MD;  Location: [facility];  Service: Urology;  Laterality: Left;   THULIUM LASER TURP (TRANSURETHRAL RESECTION OF PROSTATE) Left 11/17/2019    Procedure: THULIUM LASER of URETERAL CANCER;  Surgeon: Matilda Senior, MD;  Location: [facility];  Service: Urology;  Laterality: Left;   TRANSURETHRAL RESECTION OF BLADDER TUMOR  12/12/2006   @[facility]  by dr andra   VAGINAL HYSTERECTOMY  1980   Social History:  reports that she quit smoking about 20 years ago. Her smoking use included cigarettes. She started smoking about 58 years ago. She has a 19 pack-year smoking history. She has never used smokeless tobacco. She reports that she does not currently use alcohol. She reports that she does not use drugs.  Allergies  Allergen Reactions   Erythromycin Diarrhea and Nausea And Vomiting   Lotensin [Benazepril Hcl] Cough   Macrodantin [Nitrofurantoin] Diarrhea    And night sweats    Family History  Problem Relation Age of Onset   Hypertension Mother    Diabetes Mellitus II Sister    Hypertension Sister    Hypertension Brother    BRCA 1/2 Neg Hx    Breast cancer Neg Hx     Prior to Admission medications   Medication Sig Start Date End Date Taking? Authorizing Provider  acetaminophen  (TYLENOL ) 500 MG tablet Take 500-1,000 mg by mouth every 6 (six) hours as needed for mild pain.    [provider]  aspirin  EC 81 MG tablet Take 1 tablet (81 mg total) by mouth daily. Swallow whole. 01/09/23   Parthenia,  Olivia HERO, PA-C  carvedilol  (COREG ) 6.25 MG tablet TAKE 1 TABLET(6.25 MG) BY MOUTH TWICE DAILY WITH A MEAL 02/04/24   Chandrasekhar, Mahesh A, MD  cholecalciferol  (VITAMIN D3) 25 MCG (1000 UNIT) tablet Take 1,000 Units by mouth every morning.    [provider]  Famotidine  (HEARTBURN RELIEF PO) Take 1 tablet by mouth in the morning and at bedtime.    [provider]  Ferrous Sulfate  (IRON PO) Take 1 tablet by mouth in the morning.    [provider]  icosapent  Ethyl (VASCEPA ) 1 g capsule Take 1 capsule (1 g total) by mouth 2 (two) times daily. 04/09/24   Santo Stanly LABOR, MD  isosorbide   mononitrate (IMDUR ) 30 MG 24 hr tablet TAKE 1/2 TABLET(15 MG) BY MOUTH DAILY 03/06/24   Chandrasekhar, Mahesh A, MD  rosuvastatin  (CRESTOR ) 40 MG tablet TAKE 1 TABLET(40 MG) BY MOUTH DAILY 04/29/24   Chandrasekhar, Mahesh A, MD  sertraline  (ZOLOFT ) 50 MG tablet Take 50 mg by mouth every morning.    [provider]  traZODone  (DESYREL ) 150 MG tablet Take 150 mg by mouth at bedtime. 08/24/20   [provider]    Physical Exam: Vitals:   07/29/24 1212  BP: 129/61  Pulse: (!) 59  Resp: 18  Temp: (!) 97.3 F (36.3 C)  TempSrc: Oral  SpO2: 97%    Data Reviewed:    CBC    Component Value Date/Time   WBC 9.3 07/29/2024 0947   WBC 6.7 04/03/2024 0518   RBC 3.33 (L) 07/29/2024 0947   HGB 9.9 (L) 07/29/2024 0947   HGB 13.1 11/04/2019 1058   HCT 30.9 (L) 07/29/2024 0947   HCT 39.5 11/04/2019 1058   PLT 254 07/29/2024 0947   PLT 280 11/04/2019 1058   MCV 92.8 07/29/2024 0947   MCV 91 11/04/2019 1058   MCH 29.7 07/29/2024 0947   MCHC 32.0 07/29/2024 0947   RDW 13.6 07/29/2024 0947   RDW 13.1 11/04/2019 1058   LYMPHSABS 2.2 07/29/2024 0947   MONOABS 0.6 07/29/2024 0947   EOSABS 0.4 07/29/2024 0947   BASOSABS 0.1 07/29/2024 0947    CMP     Component Value Date/Time   NA 136 07/29/2024 1335   NA 141 08/10/2021 0835   K 4.9 07/29/2024 1335   CL 106 07/29/2024 1335   CO2 16 (L) 07/29/2024 1335   GLUCOSE 86 07/29/2024 1335   BUN 79 (H) 07/29/2024 1335   BUN 15 08/10/2021 0835   CREATININE 5.77 (H) 07/29/2024 1335   CREATININE 6.45 (H) 07/29/2024 0947   CALCIUM  8.8 (L) 07/29/2024 1335   PROT 8.1 07/29/2024 0947   PROT 6.8 04/21/2021 0809   ALBUMIN  3.5 07/29/2024 0947   ALBUMIN  4.0 04/21/2021 0809   AST 17 07/29/2024 0947   ALT 20 07/29/2024 0947   ALKPHOS 61 07/29/2024 0947   BILITOT 0.2 07/29/2024 0947   EGFR 48 (L) 08/10/2021 0835   GFRNONAA 7 (L) 07/29/2024 1335   GFRNONAA 6 (L) 07/29/2024 0947    Assessment and Plan: Acute on CKD stage  IV: Worsening Scr compared to baseline In setting of poor oral intake, nausea, vomiting and diarrhea Likely Pre-renal in nature Continue IV hydration If Scr fails to improve consult Nephrology. Urology following for stent management  Suspected UTI -Continue Rocephine Follow urine Cx.  Normocytic  anemia Hb at 9.9 Ferritin high due to anemia of chronic disease. She denies active bleeding.    Hx of Bladder cancer  s/p right nephroureterectomy for cancer  follows with Oncology   Hypertension, borderline Continue carvedilol  and imdur   prn hydralazine     CAD, nonobstructive Continue on aspirin , statin, carvedilol , imdur    OSA Not on cpap  prn O2 at bedtime.   HFpEF Compensated continue on carvedilol   EF 63%, grade II diastolic dysfunction   GERD Continue Pepcid .   HLD  Continue on crestor .         Advance Care Planning:   Code Status: Full Code   Consults: =Urology, Nephrology  Family Communication: None   Severity of Illness: The appropriate patient status for this patient is INPATIENT. Inpatient status is judged to be reasonable and necessary in order to provide the required intensity of service to ensure the patient's safety. The patient's presenting symptoms, physical exam findings, and initial radiographic and laboratory data in the context of their chronic comorbidities is felt to place them at high risk for further clinical deterioration. Furthermore, it is not anticipated that the patient will be medically stable for discharge from the hospital within 2 midnights of admission.   * I certify that at the point of admission it is my clinical judgment that the patient will require inpatient hospital care spanning beyond 2 midnights from the point of admission due to high intensity of service, high risk for further deterioration and high frequency of surveillance required.*  Author: Landon FORBES Baller, MD 07/29/2024 3:01 PM  For on call review [email or web address].

## 2024-07-29 NOTE — Progress Notes (Signed)
 HEMATOLOGY/ONCOLOGY CLINIC NOTE  Date of Service: 07/29/2024  Patient Care Team: Chrystal Lamarr RAMAN, MD as PCP - General (Family Medicine) Santo Stanly LABOR, MD as PCP - Cardiology (Cardiology) Onesimo Emaline Brink, MD as Consulting Physician (Hematology)  CHIEF COMPLAINTS/PURPOSE OF CONSULTATION:  Urothelial carcinoma    Secondary diagnoses: high-grade urothelial carcinoma of the right distal ureter in 2008.   Prior Therapy:   She is status post right nephroureterectomy for a distal ureteral carcinoma in 2008   She is status post cystoscopy and a stent placement in September 2020 and repeated in February 2021 for a left ureteral tumor.  The cytology showed high-grade urothelial carcinoma.   She is status post BCG treatment in 2021.  Subsequent cystoscopy in 2022 showed atypia without obvious urothelial carcinoma.  In July 2022 cytology showed high-grade urothelial carcinoma.   She is status post Jelmyto  instillation under the care of Dr. Matilda in September 2022 was completed in November 2022.  Following cystoscopy in January 2023 did show some residual tumor by cytology.   In May 2023 she completed repeat Jelmyto  infusion.      Current therapy: Intermittent cystoscopy and tumor ablation.  Under consideration for systemic therapy.  HISTORY OF PRESENTING ILLNESS:  [name] is a wonderful 78 y.o. female who is here for continued evaluation and management of Urothelial carcinoma. Patient has been transferred to us  by Dr. Amadeo. Patient was diagnosed with Ureteral cancer in September 2020 and was found to have high-grade urothelial carcinoma without any clear-cut muscle invasion. Her current treatment includes intermittent cystoscopy and tumor ablation.  Patient was last seen by Dr. Amadeo on 09/13/2022 and she complained of occasional dizziness without syncope or falls.   Patient is accompanied by her husband during this visit. She reports she has been  doing fairly well without any new or severe medical concerns since her last visit with Dr. Amadeo.   She denies fever, chills, night sweats, unexpected weight loss, abdominal pain, chest pain, new lump/bump, back pain, hematuria, abnormal bowel moment, shortness of breath, or leg swelling.   Patient notes she has been eating well overall and her weight has been stable.   Patient denies any PET or CT scans. She denies of any urinary infection.  She continues to follow-up with Dr. Matilda, but notes that Dr. Matilda is retiring soon.  INTERVAL HISTORY:  [name] is a wonderful 78 y.o. female who is here for continued evaluation and management of Urothelial carcinoma. She is here with her husband today.  Patient was last seen by me on 12/17/2023 and was doing well overall since our last visit. She was hospitalized in October 2024, where she had CT scan showing left para-aortic retroperitoneal adenopathy. She has been following up with Dr. Patrcia. Complained of diarrhea and occasional mild abdominal pain/discomfort.   Today, we reviewed her hospital visit in June, when she was seen for Acute on Chronic Kidney Injury triggered by an UTI. CT Abdomen pelvis showed: 1. Left ureteral stent in satisfactory position 2. Status post right nephroureterectomy. 3. Progressive left periaortic nodes measuring up to 10 mm short axis, suspicious for nodal metastases. She had a stent placement in April 2025, and at the time imaging of her bladder was also clear. Saw her Urologist, Dr. Alvaro again yesterday who mentioned that the stent replacement would be upcoming in the next 1-2 weeks.   She endorses having had another UTI on 9/23, and in the past week her appetite has decreased according  to her husband and she has only been having one bowel of cereal per day, as well as experiencing fevers/chills (today's temperature is 97.3 F) and nausea. Additionally, experiencing right flank pain, s/p right  nephroureterectomy, but no left flank pain. Denies dysuria, but notes that urine is mucosal.  She did contact her PCP last week about this and was advised to go to the ED, which she did not. Additionally, did not mention her symptoms to Dr. Alvaro. Did give a urine sample, but this has not been cultured yet. Creatinine at time of presentation in June was 7.78, today 6.45 which is elevated from her discharge Cr of 3.68.   Denies bleeding issues, black/bloody stools, bloody urine, new lumps/bumps, SOB, chest pain.  MEDICAL HISTORY:  Past Medical History:  Diagnosis Date   Anemia associated with chronic renal failure    Arthritis    Blood dyscrasia 2008   hx of PE   CAD in native artery 10/2019   cardiologist-   dr christella. santo;   a. 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   urologist--- dr dahlstedt/  oncologist--- dr amadeo;   dx 09/ 2020 high grade urothelial  s/p laser ablation, ;  recurrent s/p BCG instillation,  completed chemo instilation 11/ 2022  and repeat chemo completed 05/ 2023   Chronic combined systolic and diastolic CHF (congestive heart failure) (HCC) 06/2019   followed by cardiology;   dx 09/ 2022 in setting of sepsis, pulm edema;   05/ 2022  ef 40-45% per echo,  recovered per cath 01/ 2021 ef 50%;  laste echo 08/ 2023  ef 60-65%   CKD (chronic kidney disease), stage III (HCC)    Depression    Dysrhythmia    bradycardia   GAD (generalized anxiety disorder)    GERD (gastroesophageal reflux disease)    History of bladder cancer 12/2006   followed by dr matilda   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   dx 04/ 2008  w/ poor function hydronephrotic  right kidney;   02-06-2007  s/p right nephroureterectomy   History of kidney stones    History of pulmonary embolism 04/2022   in setting severe sepsis;  small RLL, treated w/ 3 months eliquis    Hyperlipidemia, mixed     Hypertension    Solitary kidney, acquired 02/06/2007   s/p  right nephroureterectomy for cancer    SURGICAL HISTORY: Past Surgical History:  Procedure Laterality Date   CATARACT EXTRACTION W/ INTRAOCULAR LENS IMPLANT Bilateral 2011   CYSTOSCOPY W/ RETROGRADES Left 01/30/2024   Procedure: CYSTOSCOPY, WITH RETROGRADE PYELOGRAM;  Surgeon: Alvaro Ricardo KATHEE Mickey., MD;  Location: [facility];  Service: Urology;  Laterality: Left;   CYSTOSCOPY W/ URETERAL STENT PLACEMENT Left 11/17/2019   Procedure: CYSTOSCOPY WITH STENT REPLACEMENT;  Surgeon: Matilda Senior, MD;  Location: [facility];  Service: Urology;  Laterality: Left;   CYSTOSCOPY W/ URETERAL STENT PLACEMENT Left 03/23/2022   Procedure: CYSTOSCOPY WITH RETROGRADE PYELOGRAM, LEFT URETERAL STENT PLACEMENT;  Surgeon: Matilda Senior, MD;  Location: [facility];  Service: Urology;  Laterality: Left;   CYSTOSCOPY W/ URETERAL STENT PLACEMENT Left 06/09/2022   Procedure: CYSTOSCOPY WITH LEFT STENT EXCHANGE;  Surgeon: Matilda Senior, MD;  Location: [facility];  Service: Urology;  Laterality: Left;   CYSTOSCOPY W/ URETERAL STENT PLACEMENT Left 12/04/2022   Procedure: CYSTOSCOPY WITH STENT REPLACEMENT;  Surgeon: Matilda Senior, MD;  Location: [facility];  Service: Urology;  Laterality: Left;   CYSTOSCOPY W/ URETERAL STENT REMOVAL Left 05/10/2020   Procedure: CYSTOSCOPY WITH STENT REMOVAL;  Surgeon: Matilda Senior, MD;  Location: [facility];  Service: Urology;  Laterality: Left;   CYSTOSCOPY W/ URETERAL STENT REMOVAL Left 05/02/2021   Procedure: CYSTOSCOPY WITH STENT REMOVAL;  Surgeon: Matilda Senior, MD;  Location: [facility];  Service: Urology;  Laterality: Left;   CYSTOSCOPY W/ URETERAL STENT REMOVAL Left 11/16/2022   Procedure: CYSTOSCOPY WITH STENT REMOVAL;  Surgeon: Matilda Senior, MD;  Location: [facility];  Service: Urology;  Laterality:  Left;   CYSTOSCOPY WITH RETROGRADE PYELOGRAM, URETEROSCOPY AND STENT PLACEMENT Left 06/27/2019   Procedure: CYSTOSCOPY WITH LEFT RETROGRADE PYELOGRAM, URETEROSCOPY, BIOPSY AND LEFT STENT PLACEMENT;  Surgeon: Watt Rush, MD;  Location: [facility];  Service: Urology;  Laterality: Left;   CYSTOSCOPY WITH RETROGRADE PYELOGRAM, URETEROSCOPY AND STENT PLACEMENT Left 08/18/2019   Procedure: CYSTOSCOPY, URETEROSCOPY AND STENT EXCHANGE;  Surgeon: Matilda Senior, MD;  Location: [facility];  Service: Urology;  Laterality: Left;  90 MINS   CYSTOSCOPY WITH RETROGRADE PYELOGRAM, URETEROSCOPY AND STENT PLACEMENT Left 11/17/2019   Procedure: CYSTOSCOPY WITH RETROGRADE PYELOGRAM, URETEROSCOPY AND STENT PLACEMENT;  Surgeon: Matilda Senior, MD;  Location: [facility];  Service: Urology;  Laterality: Left;  90 MINS   CYSTOSCOPY WITH RETROGRADE PYELOGRAM, URETEROSCOPY AND STENT PLACEMENT Left 05/10/2020   Procedure: CYSTOSCOPY WITH RETROGRADE PYELOGRAM, URETEROSCOPY AND STENT PLACEMENT WITH URETHRAL DIALATION AND BRUSH BIOPSY;  Surgeon: Matilda Senior, MD;  Location: [facility];  Service: Urology;  Laterality: Left;  1 HR   CYSTOSCOPY WITH RETROGRADE PYELOGRAM, URETEROSCOPY AND STENT PLACEMENT Left 09/16/2020   Procedure: CYSTOSCOPY WITH RETROGRADE PYELOGRAM, URETEROSCOPY ,  LITHOPAXY, LEFT URETERAL BRUSHING, AND STENT REPLACEMENT;  Surgeon: Matilda Senior, MD;  Location: [facility];  Service: Urology;  Laterality: Left;   CYSTOSCOPY WITH RETROGRADE PYELOGRAM, URETEROSCOPY AND STENT PLACEMENT Left 10/24/2021   Procedure: CYSTOSCOPY WITH RETROGRADE PYELOGRAM, URETEROSCOPY, POSSIBLE URETERAL AND RENAL BIOPSIES AND STENT PLACEMENT;  Surgeon: Matilda Senior, MD;  Location: [facility];  Service: Urology;  Laterality: Left;  1 HR   CYSTOSCOPY WITH RETROGRADE PYELOGRAM, URETEROSCOPY AND STENT PLACEMENT Left 03/09/2022   Procedure: CYSTOSCOPY WITH  ANTEGRADE PYELOGRAM,  STENT REMOVAL;  Surgeon: Matilda Senior, MD;  Location: [facility];  Service: Urology;  Laterality: Left;   CYSTOSCOPY WITH RETROGRADE PYELOGRAM, URETEROSCOPY AND STENT PLACEMENT  01/09/2007   @[facility]  by dr matilda;    bx's/ washing  bladder , right ureter   CYSTOSCOPY WITH RETROGRADE PYELOGRAM, URETEROSCOPY AND STENT PLACEMENT Left 11/16/2022   Procedure: CYSTOSCOPY WITH RETROGRADE PYELOGRAM, URETEROSCOPY AND STENT REPLACEMENT;  Surgeon: Matilda Senior, MD;  Location: [facility];  Service: Urology;  Laterality: Left;  90 MINS   CYSTOSCOPY WITH RETROGRADE PYELOGRAM, URETEROSCOPY AND STENT PLACEMENT Left 07/06/2023   Procedure: CYSTOSCOPY WITH LEFT RETROGRADE PYELOGRAM, URETEROSCOPY, LASER ABLATION OF TUMOR AND STENT EXCHANGE;  Surgeon: Alvaro Ricardo KATHEE Mickey., MD;  Location: [facility];  Service: Urology;  Laterality: Left;  60 MINUTES NEEDED FOR CASE   CYSTOSCOPY/URETEROSCOPY/HOLMIUM LASER Left 12/04/2022   Procedure: CYSTOSCOPY/URETEROSCOPY/  HOLMIUM LASER OF RENAL PELVIC LESIONS;  Surgeon: Matilda Senior, MD;  Location: [facility];  Service: Urology;  Laterality: Left;   CYSTOSCOPY/URETEROSCOPY/HOLMIUM LASER/STENT PLACEMENT Left 05/02/2021   Procedure: CYSTOSCOPY/URETEROSCOPY WITH BRUSH BIOPSY/ RETROGRADE PYELOGRAM/ HOLMIUM LASER/STENT REPLACEMENT;  Surgeon: Matilda Senior, MD;  Location: [facility];  Service: Urology;  Laterality: Left;   CYSTOSCOPY/URETEROSCOPY/HOLMIUM LASER/STENT PLACEMENT Left 01/30/2024   Procedure: CYSTOSCOPY/DIAGNOSTIC URETEROSCOPY/ RETROGRADE/STENT EXCHANGE;  Surgeon: Alvaro Ricardo KATHEE Mickey., MD;  Location: [facility];  Service: Urology;  Laterality: Left;   HOLMIUM LASER APPLICATION Left 09/16/2020   Procedure: HOLMIUM LASER APPLICATION;  Surgeon: Matilda Senior, MD;  Location: [facility];  Service: Urology;  Laterality: Left;   HOLMIUM LASER APPLICATION Left 10/24/2021    Procedure: HOLMIUM LASER APPLICATION OF TUMORS;  Surgeon: Matilda Senior, MD;  Location: [facility];  Service: Urology;  Laterality: Left;   HOLMIUM LASER APPLICATION Left 11/16/2022   Procedure: HOLMIUM LASER OF TUMORS;  Surgeon: Matilda Senior, MD;  Location: [facility];  Service: Urology;  Laterality: Left;   IR NEPHROSTOMY PLACEMENT LEFT  07/06/2021   IR NEPHROSTOMY PLACEMENT LEFT  02/06/2022   NEPHROSTOMY TUBE REMOVAL Left 03/23/2022   Procedure: NEPHROSTOMY TUBE REMOVAL;  Surgeon: Matilda Senior, MD;  Location: [facility];  Service: Urology;  Laterality: Left;   NEPHROURETERECTOMY Right 02/06/2007   @[facility]  by dr matilda;   Laparoscopic   RIGHT/LEFT HEART CATH AND CORONARY ANGIOGRAPHY N/A 11/06/2019   Procedure: RIGHT/LEFT HEART CATH AND CORONARY ANGIOGRAPHY;  Surgeon: Claudene Victory ORN, MD;  Location: [facility];  Service: Cardiovascular;  Laterality: N/A;   THULIUM LASER TURP (TRANSURETHRAL RESECTION OF PROSTATE) Left 08/18/2019   Procedure: THULIUM LASER ABLATION OF URETERAL TUMOR;  Surgeon: Matilda Senior, MD;  Location: [facility];  Service: Urology;  Laterality: Left;   THULIUM LASER TURP (TRANSURETHRAL RESECTION OF PROSTATE) Left 11/17/2019   Procedure: THULIUM LASER of URETERAL CANCER;  Surgeon: Matilda Senior, MD;  Location: [facility];  Service: Urology;  Laterality: Left;   TRANSURETHRAL RESECTION OF BLADDER TUMOR  12/12/2006   @[facility]  by dr andra   VAGINAL HYSTERECTOMY  1980    SOCIAL HISTORY: Social History   Socioeconomic History   Marital status: Married    Spouse name: Not on file   Number of children: Not on file   Years of education: Not on file   Highest education level: Not on file  Occupational History   Not on file  Tobacco Use   Smoking status: Former    Current packs/day: 0.00    Average packs/day: 0.5 packs/day for 38.0 years (19.0 ttl pk-yrs)    Types: Cigarettes     Start date: 11/25/1965    Quit date: 11/26/2003    Years since quitting: 20.6   Smokeless tobacco: Never  Vaping Use   Vaping status: Never Used  Substance and Sexual Activity   Alcohol use: Not Currently   Drug use: Never   Sexual activity: Yes    Birth control/protection: Surgical, Post-menopausal  Other Topics Concern   Not on file  Social History Narrative   Not on file   Social Drivers of Health   Financial Resource Strain: Not on file  Food Insecurity: No Food Insecurity (03/28/2024)   Hunger Vital Sign    Worried About Running Out of Food in the Last Year: Never true    Ran Out of Food in the Last Year: Never true  Transportation Needs: No Transportation Needs (03/28/2024)   PRAPARE - Administrator, Civil Service (Medical): No    Lack of Transportation (Non-Medical): No  Physical Activity: Not on file  Stress: Not on file  Social Connections: Moderately Integrated (03/28/2024)   Social Connection and Isolation Panel    Frequency  of Communication with Friends and Family: More than three times a week    Frequency of Social Gatherings with Friends and Family: Three times a week    Attends Religious Services: 1 to 4 times per year    Active Member of Clubs or Organizations: No    Attends Banker Meetings: Never    Marital Status: Married  Catering Manager Violence: Not At Risk (03/28/2024)   Humiliation, Afraid, Rape, and Kick questionnaire    Fear of Current or Ex-Partner: No    Emotionally Abused: No    Physically Abused: No    Sexually Abused: No    FAMILY HISTORY: Family History  Problem Relation Age of Onset   Hypertension Mother    Diabetes Mellitus II Sister    Hypertension Sister    Hypertension Brother    BRCA 1/2 Neg Hx    Breast cancer Neg Hx     ALLERGIES:  is allergic to erythromycin, lotensin [benazepril hcl], and macrodantin [nitrofurantoin].  MEDICATIONS:  Current Outpatient Medications  Medication Sig Dispense Refill    acetaminophen  (TYLENOL ) 500 MG tablet Take 500-1,000 mg by mouth every 6 (six) hours as needed for mild pain.     aspirin  EC 81 MG tablet Take 1 tablet (81 mg total) by mouth daily. Swallow whole. 90 tablet 3   carvedilol  (COREG ) 6.25 MG tablet TAKE 1 TABLET(6.25 MG) BY MOUTH TWICE DAILY WITH A MEAL 180 tablet 3   cholecalciferol  (VITAMIN D3) 25 MCG (1000 UNIT) tablet Take 1,000 Units by mouth every morning.     Famotidine  (HEARTBURN RELIEF PO) Take 1 tablet by mouth in the morning and at bedtime.     Ferrous Sulfate  (IRON PO) Take 1 tablet by mouth in the morning.     fluconazole  (DIFLUCAN ) 100 MG tablet Take 1 tablet (100 mg total) by mouth daily. 5 tablet 0   icosapent  Ethyl (VASCEPA ) 1 g capsule Take 1 capsule (1 g total) by mouth 2 (two) times daily. 180 capsule 2   isosorbide  mononitrate (IMDUR ) 30 MG 24 hr tablet TAKE 1/2 TABLET(15 MG) BY MOUTH DAILY 45 tablet 3   rosuvastatin  (CRESTOR ) 40 MG tablet TAKE 1 TABLET(40 MG) BY MOUTH DAILY 90 tablet 2   sertraline  (ZOLOFT ) 50 MG tablet Take 50 mg by mouth every morning.     traZODone  (DESYREL ) 150 MG tablet Take 150 mg by mouth at bedtime.     No current facility-administered medications for this visit.    REVIEW OF SYSTEMS:    10 Point review of Systems was done is negative except as noted above.  PHYSICAL EXAMINATION: ECOG PERFORMANCE STATUS: 2 - Symptomatic, <50% confined to bed  Vitals:   07/29/24 1040  BP: 129/61  Pulse: 67  Resp: 18  Temp: (!) 97.3 F (36.3 C)  SpO2: 93%   Filed Weights   07/29/24 1040  Weight: 173 lb 8 oz (78.7 kg)   Body mass index is 30.74 kg/m.  GENERAL:alert, in no acute distress and comfortable SKIN: no acute rashes, no significant lesions EYES: conjunctiva are pink and non-injected, sclera anicteric OROPHARYNX: MMM, no exudates, no oropharyngeal erythema or ulceration NECK: supple, no JVD LYMPH:  no palpable lymphadenopathy in the cervical, axillary or inguinal regions LUNGS: clear to  auscultation b/l with normal respiratory effort HEART: regular rate & rhythm ABDOMEN:  normoactive bowel sounds , non tender, not distended. Extremity: no pedal edema PSYCH: alert & oriented x 3 with fluent speech NEURO: no focal motor/sensory deficits  LABORATORY DATA:  I have reviewed the data as listed .    Latest Ref Rng & Units 07/29/2024    9:47 AM 04/03/2024    5:18 AM 04/02/2024    5:32 AM  CBC  WBC 4.0 - 10.5 K/uL 9.3  6.7  7.0   Hemoglobin 12.0 - 15.0 g/dL 9.9  8.2  8.2   Hematocrit 36.0 - 46.0 % 30.9  25.9  25.4   Platelets 150 - 400 K/uL 254  256  264        Latest Ref Rng & Units 07/29/2024    9:47 AM 04/03/2024    5:18 AM 04/02/2024    5:32 AM 04/01/2024    5:30 AM 03/31/2024    5:44 AM 03/30/2024    6:40 AM 03/29/2024    8:27 AM  CMP  Glucose 70 - 99 mg/dL 859  92  85  99  898  86  110   BUN 8 - 23 mg/dL 84  41  51  66  66  73  73   Creatinine 0.44 - 1.00 mg/dL 3.54  6.51  5.68  4.65  6.00  6.31  6.36   Sodium 135 - 145 mmol/L 137  139  133  136  135  133  131   Potassium 3.5 - 5.1 mmol/L 4.5  3.7  2.9  4.1  4.6  4.1  4.0   Chloride 98 - 111 mmol/L 106  95  95  106  109  107  101   CO2 22 - 32 mmol/L 21  32  27  20  17  16  16    Calcium  8.9 - 10.3 mg/dL 9.7  7.4  7.4  8.0  8.2  7.7  8.0   Total Protein 6.5 - 8.1 g/dL 8.1         Total Bilirubin 0.0 - 1.2 mg/dL 0.2         Alkaline Phos 38 - 126 U/L 61         AST 15 - 41 U/L 17         ALT 0 - 44 U/L 20          Lab Results  Component Value Date   CREATININE 6.45 (H) 07/29/2024   CREATININE 3.48 (H) 04/03/2024   CREATININE 4.31 (H) 04/02/2024   CREATININE 5.34 (H) 04/01/2024   CREATININE 6.00 (H) 03/31/2024   CREATININE 6.31 (H) 03/30/2024   CREATININE 6.36 (H) 03/29/2024   CREATININE 7.04 (H) 03/28/2024   CREATININE 7.13 (H) 03/27/2024   CREATININE 7.87 (H) 03/27/2024   RADIOGRAPHIC STUDIES: I have personally reviewed the radiological images as listed and agreed with the findings in the report. No  results found.   ASSESSMENT & PLAN:   78 year old woman with:   1.   Left ureteral cancer diagnosed in September 2020.her tumor appears to be superficial although no documentation of muscle sampling on cytology.    She is status post right nephroureterectomy for a distal ureteral carcinoma in 2008   She is status post cystoscopy and a stent placement in September 2020 and repeated in February 2021 for a left ureteral tumor.  The cytology showed high-grade urothelial carcinoma.   She is status post BCG treatment in 2021.  Subsequent cystoscopy in 2022 showed atypia without obvious urothelial carcinoma.  In July 2022 cytology showed high-grade urothelial carcinoma.   She is status post Jelmyto  instillation under the care of Dr. Matilda in September 2022 was completed in November 2022.  Following cystoscopy in January 2023 did show some residual tumor by cytology.   In May 2023 she completed repeat Jelmyto  infusion.    2.  Anemia: Appears to be improving at this time with her symptoms of dizziness and fatigue is improved.   3.  Chronic renal insufficiency: Creatinine clearance is around 20 cc/min likely contributing to her anemia.   PLAN: - Discussed lab results on 07/29/2024 in detail with patient: CBC showed Hemoglobin of 9.9, increaed from 8.2 and PLTs of 254K. CMP with Creatinine 6.45 increased from 3.48 at time of discharge with the presentation levels being 7.78 and BUN is 84, elevated from 41.   - Advised to present to the ED today due to symptoms (chills, decreased appetite, nausea, right flank pain, and mucosal urine) and clinical indications (concern for stent obstruction with single kidney), and she is agreeable to this plan.  - In context of recent recurrent infections, there is difficulty determining if LN is related to these or indicative of progression   - Continue to follow-up with PCP, Gastroenterologist, and Urologist. - Answered all of patient's questions.    FOLLOW-UP: Transfer to ER for acute on chornic renal failure and concerns for Stent obstruction and UTI  The total time spent in the appointment was 30 minutes* .  All of the patient's questions were answered with apparent satisfaction. The patient knows to call the clinic with any problems, questions or concerns.  Emaline Saran MD MS AAHIVMS [facility] Hematology/Oncology Physician [facility]  *Total Encounter Time as defined by the Centers for Medicare and Medicaid Services includes, in addition to the face-to-face time of a patient visit (documented in the note above) non-face-to-face time: obtaining and reviewing outside history, ordering and reviewing medications, tests or procedures, care coordination (communications with other health care professionals or caregivers) and documentation in the medical record.   I,  Damien Lagle,acting as a scribe for Emaline Saran, MD.,have documented all relevant documentation on the behalf of Emaline Saran, MD,as directed by  Emaline Saran, MD while in the presence of Emaline Saran, MD.  I have reviewed the above documentation for accuracy and completeness, and I agree with the above. Emaline Candida Saran MD.

## 2024-07-29 NOTE — ED Triage Notes (Signed)
 Creat. 6.45. Was in cancer center for routine labs Fever and chills for the last few days. Hx of ureter stent, concern for blockage.

## 2024-07-29 NOTE — Consult Note (Signed)
 Urology Consult Note   Requesting Attending Physician:  Randol Simmonds, MD Service Providing Consult: Urology  Consulting Attending: Dr. Alvaro   Reason for Consult:  AoCKD 2/2 stent failure vs dehydration  HPI: [name] is seen in consultation for reasons noted above at the request of Randol Simmonds, MD. Patient is a 78 y.o. female presenting from Oncologist office after routine lab testing reveal SCR of 6.45 from a baseline of around 3.5. Last seen in clinic by Dr. Alvaro 01/03/24.  Patient is alert and oriented x 3 and resting comfortably in bed on my arrival.  She was accompanied by her husband.  Both were good historians.  Reviewed case and plan and all questions were answered to their satisfaction.  1 - Multifocal Possibly Metastatic Urothelial Carcinoma - Long h/o high grade bladder and upper tract cancer since 2008 s/p right neph-U. Left high grade renal pelvis cancer since 2020. BCG induction 2021. Jelmyto  via nephrostomy 2022 and 2023 (two separate cycles)   Recnet Course:  11/2022 - Let 7x24 JJ stent exchange by Dahlsted / laser ablation few small ? lesions;  06/2023 CT - non-specific hilarl adenopathy (<2cm, prev PET negative), OR ureteroscopioc tumro ablation / stent exchange  12/2023 - CT chest/abd/pelvis - no over metastatic disease, stable/improved shoddy adenopathy.  01/30/24- left stent exchange-Manny  2 - Stage 4 Renal Insuficiency / Solitary Kidney - Cr 2s, GFR 20s x many. Solitary left kidney.   3 - Bacteruria - non-clonal CX's x several.   PMH sig for CAD (med mgm't follows [facility]), benign hst. Lives independantly with husband. Her PCP is Lamarr Rotunda MD.  ------------------  Assessment:   78 y.o. female with AoCKD and bilateral ureteral stents  Recommendations: #AoCKD  Trend SCr, baseline of around 3.5, 6.45 at Oncologists today. Recheck of 5.77 after 1L bolus and minimal maintenance.   Surgically absent right kidney, left stent appears in place.  No hydronephrosis noted. Favoring dehydration 2/2 pt reported vomiting, over stent failure. No surgical indication at this time. Stent exchanges as scheduled on outpt basis. Please call with questions.   Case and plan discussed with Dr.Manny  Past Medical History: Past Medical History:  Diagnosis Date   Anemia associated with chronic renal failure    Arthritis    Blood dyscrasia 2008   hx of PE   CAD in native artery 10/2019   cardiologist-   dr christella. santo;   a. 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   urologist--- dr dahlstedt/  oncologist--- dr amadeo;   dx 09/ 2020 high grade urothelial  s/p laser ablation, ;  recurrent s/p BCG instillation,  completed chemo instilation 11/ 2022  and repeat chemo completed 05/ 2023   Chronic combined systolic and diastolic CHF (congestive heart failure) (HCC) 06/2019   followed by cardiology;   dx 09/ 2022 in setting of sepsis, pulm edema;   05/ 2022  ef 40-45% per echo,  recovered per cath 01/ 2021 ef 50%;  laste echo 08/ 2023  ef 60-65%   CKD (chronic kidney disease), stage III (HCC)    Depression    Dysrhythmia    bradycardia   GAD (generalized anxiety disorder)    GERD (gastroesophageal reflux disease)    History of bladder cancer 12/2006   followed by dr matilda   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   dx 04/ 2008  w/  poor function hydronephrotic  right kidney;   02-06-2007  s/p right nephroureterectomy   History of kidney stones    History of pulmonary embolism 04/2022   in setting severe sepsis;  small RLL, treated w/ 3 months eliquis    Hyperlipidemia, mixed    Hypertension    Solitary kidney, acquired 02/06/2007   s/p  right nephroureterectomy for cancer    Past Surgical History:  Past Surgical History:  Procedure Laterality Date   CATARACT EXTRACTION W/ INTRAOCULAR LENS IMPLANT Bilateral 2011   CYSTOSCOPY W/ RETROGRADES  Left 01/30/2024   Procedure: CYSTOSCOPY, WITH RETROGRADE PYELOGRAM;  Surgeon: Alvaro Ricardo KATHEE Mickey., MD;  Location: [facility];  Service: Urology;  Laterality: Left;   CYSTOSCOPY W/ URETERAL STENT PLACEMENT Left 11/17/2019   Procedure: CYSTOSCOPY WITH STENT REPLACEMENT;  Surgeon: Matilda Senior, MD;  Location: [facility];  Service: Urology;  Laterality: Left;   CYSTOSCOPY W/ URETERAL STENT PLACEMENT Left 03/23/2022   Procedure: CYSTOSCOPY WITH RETROGRADE PYELOGRAM, LEFT URETERAL STENT PLACEMENT;  Surgeon: Matilda Senior, MD;  Location: [facility];  Service: Urology;  Laterality: Left;   CYSTOSCOPY W/ URETERAL STENT PLACEMENT Left 06/09/2022   Procedure: CYSTOSCOPY WITH LEFT STENT EXCHANGE;  Surgeon: Matilda Senior, MD;  Location: [facility];  Service: Urology;  Laterality: Left;   CYSTOSCOPY W/ URETERAL STENT PLACEMENT Left 12/04/2022   Procedure: CYSTOSCOPY WITH STENT REPLACEMENT;  Surgeon: Matilda Senior, MD;  Location[facility];  Service: Urology;  Laterality: Left;   CYSTOSCOPY W/ URETERAL STENT REMOVAL Left 05/10/2020   Procedure: CYSTOSCOPY WITH STENT REMOVAL;  Surgeon: Matilda Senior, MD;  Location: [facility];  Service: Urology;  Laterality: Left;   CYSTOSCOPY W/ URETERAL STENT REMOVAL Left 05/02/2021   Procedure: CYSTOSCOPY WITH STENT REMOVAL;  Surgeon: Matilda Senior, MD;  Location: [facility];  Service: Urology;  Laterality: Left;   CYSTOSCOPY W/ URETERAL STENT REMOVAL Left 11/16/2022   Procedure: CYSTOSCOPY WITH STENT REMOVAL;  Surgeon: Matilda Senior, MD;  Location: [facility];  Service: Urology;  Laterality: Left;   CYSTOSCOPY WITH RETROGRADE PYELOGRAM, URETEROSCOPY AND STENT PLACEMENT Left 06/27/2019   Procedure: CYSTOSCOPY WITH LEFT RETROGRADE PYELOGRAM, URETEROSCOPY, BIOPSY AND LEFT STENT PLACEMENT;  Surgeon: Watt Rush, MD;  Location: [facility];  Service: Urology;   Laterality: Left;   CYSTOSCOPY WITH RETROGRADE PYELOGRAM, URETEROSCOPY AND STENT PLACEMENT Left 08/18/2019   Procedure: CYSTOSCOPY, URETEROSCOPY AND STENT EXCHANGE;  Surgeon: Matilda Senior, MD;  Location: [facility];  Service: Urology;  Laterality: Left;  90 MINS   CYSTOSCOPY WITH RETROGRADE PYELOGRAM, URETEROSCOPY AND STENT PLACEMENT Left 11/17/2019   Procedure: CYSTOSCOPY WITH RETROGRADE PYELOGRAM, URETEROSCOPY AND STENT PLACEMENT;  Surgeon: Matilda Senior, MD;  Location: [facility];  Service: Urology;  Laterality: Left;  90 MINS   CYSTOSCOPY WITH RETROGRADE PYELOGRAM, URETEROSCOPY AND STENT PLACEMENT Left 05/10/2020   Procedure: CYSTOSCOPY WITH RETROGRADE PYELOGRAM, URETEROSCOPY AND STENT PLACEMENT WITH URETHRAL DIALATION AND BRUSH BIOPSY;  Surgeon: Matilda Senior, MD;  Location: [place];  Service: Urology;  Laterality: Left;  1 HR   CYSTOSCOPY WITH RETROGRADE PYELOGRAM, URETEROSCOPY AND STENT PLACEMENT Left 09/16/2020   Procedure: CYSTOSCOPY WITH RETROGRADE PYELOGRAM, URETEROSCOPY ,  LITHOPAXY, LEFT URETERAL BRUSHING, AND STENT REPLACEMENT;  Surgeon: Matilda Senior, MD;  Location: [facility];  Service: Urology;  Laterality: Left;   CYSTOSCOPY WITH RETROGRADE PYELOGRAM, URETEROSCOPY AND STENT PLACEMENT Left 10/24/2021   Procedure: CYSTOSCOPY WITH RETROGRADE PYELOGRAM, URETEROSCOPY, POSSIBLE URETERAL AND RENAL BIOPSIES AND STENT PLACEMENT;  Surgeon: Matilda Senior, MD;  Location: [facility];  Service: Urology;  Laterality: Left;  1 HR   CYSTOSCOPY WITH RETROGRADE PYELOGRAM, URETEROSCOPY AND STENT PLACEMENT Left 03/09/2022   Procedure: CYSTOSCOPY WITH ANTEGRADE PYELOGRAM,  STENT REMOVAL;  Surgeon: Matilda Senior, MD;  Location: [facility];  Service: Urology;  Laterality: Left;   CYSTOSCOPY WITH RETROGRADE PYELOGRAM, URETEROSCOPY AND STENT PLACEMENT  01/09/2007   @[facility]  by dr matilda;    bx's/ washing   bladder , right ureter   CYSTOSCOPY WITH RETROGRADE PYELOGRAM, URETEROSCOPY AND STENT PLACEMENT Left 11/16/2022   Procedure: CYSTOSCOPY WITH RETROGRADE PYELOGRAM, URETEROSCOPY AND STENT REPLACEMENT;  Surgeon: Matilda Senior, MD;  Location: [facility];  Service: Urology;  Laterality: Left;  90 MINS   CYSTOSCOPY WITH RETROGRADE PYELOGRAM, URETEROSCOPY AND STENT PLACEMENT Left 07/06/2023   Procedure: CYSTOSCOPY WITH LEFT RETROGRADE PYELOGRAM, URETEROSCOPY, LASER ABLATION OF TUMOR AND STENT EXCHANGE;  Surgeon: Alvaro Ricardo KATHEE Mickey., MD;  Location: [facility];  Service: Urology;  Laterality: Left;  60 MINUTES NEEDED FOR CASE   CYSTOSCOPY/URETEROSCOPY/HOLMIUM LASER Left 12/04/2022   Procedure: CYSTOSCOPY/URETEROSCOPY/  HOLMIUM LASER OF RENAL PELVIC LESIONS;  Surgeon: Matilda Senior, MD;  Location: [facility];  Service: Urology;  Laterality: Left;   CYSTOSCOPY/URETEROSCOPY/HOLMIUM LASER/STENT PLACEMENT Left 05/02/2021   Procedure: CYSTOSCOPY/URETEROSCOPY WITH BRUSH BIOPSY/ RETROGRADE PYELOGRAM/ HOLMIUM LASER/STENT REPLACEMENT;  Surgeon: Matilda Senior, MD;  Location: [facility];  Service: Urology;  Laterality: Left;   CYSTOSCOPY/URETEROSCOPY/HOLMIUM LASER/STENT PLACEMENT Left 01/30/2024   Procedure: CYSTOSCOPY/DIAGNOSTIC URETEROSCOPY/ RETROGRADE/STENT EXCHANGE;  Surgeon: Alvaro Ricardo KATHEE Mickey., MD;  Location: [facility];  Service: Urology;  Laterality: Left;   HOLMIUM LASER APPLICATION Left 09/16/2020   Procedure: HOLMIUM LASER APPLICATION;  Surgeon: Matilda Senior, MD;  Location: [facility];  Service: Urology;  Laterality: Left;   HOLMIUM LASER APPLICATION Left 10/24/2021   Procedure: HOLMIUM LASER APPLICATION OF TUMORS;  Surgeon: Matilda Senior, MD;  Location: [facility];  Service: Urology;  Laterality: Left;   HOLMIUM LASER APPLICATION Left 11/16/2022   Procedure: HOLMIUM LASER OF TUMORS;  Surgeon: Matilda Senior,  MD;  Location: [facility];  Service: Urology;  Laterality: Left;   IR NEPHROSTOMY PLACEMENT LEFT  07/06/2021   IR NEPHROSTOMY PLACEMENT LEFT  02/06/2022   NEPHROSTOMY TUBE REMOVAL Left 03/23/2022   Procedure: NEPHROSTOMY TUBE REMOVAL;  Surgeon: Matilda Senior, MD;  Location: [facility];  Service: Urology;  Laterality: Left;   NEPHROURETERECTOMY Right 02/06/2007   @[facility]  by dr matilda;   Laparoscopic   RIGHT/LEFT HEART CATH AND CORONARY ANGIOGRAPHY N/A 11/06/2019   Procedure: RIGHT/LEFT HEART CATH AND CORONARY ANGIOGRAPHY;  Surgeon: Claudene Victory ORN, MD;  Location: [facility];  Service: Cardiovascular;  Laterality: N/A;   THULIUM LASER TURP (TRANSURETHRAL RESECTION OF PROSTATE) Left 08/18/2019   Procedure: THULIUM LASER ABLATION OF URETERAL TUMOR;  Surgeon: Matilda Senior, MD;  Location: [facility];  Service: Urology;  Laterality: Left;   THULIUM LASER TURP (TRANSURETHRAL RESECTION OF PROSTATE) Left 11/17/2019   Procedure: THULIUM LASER of URETERAL CANCER;  Surgeon: Matilda Senior, MD;  [facility];  Service: Urology;  Laterality: Left;   TRANSURETHRAL RESECTION OF BLADDER TUMOR  12/12/2006   @[facility]  by dr andra   VAGINAL HYSTERECTOMY  1980    Medication: Current Facility-Administered Medications  Medication Dose Route Frequency Provider Last Rate Last Admin   ondansetron  (ZOFRAN ) injection 4 mg  4 mg Intravenous Once Knapp, Jon, MD  Current Outpatient Medications  Medication Sig Dispense Refill   acetaminophen  (TYLENOL ) 500 MG tablet Take 500-1,000 mg by mouth every 6 (six) hours as needed for mild pain.     aspirin  EC 81 MG tablet Take 1 tablet (81 mg total) by mouth daily. Swallow whole. 90 tablet 3   carvedilol  (COREG ) 6.25 MG tablet TAKE 1 TABLET(6.25 MG) BY MOUTH TWICE DAILY WITH A MEAL 180 tablet 3   cholecalciferol  (VITAMIN D3) 25 MCG (1000 UNIT) tablet Take 1,000 Units by mouth every morning.      Famotidine  (HEARTBURN RELIEF PO) Take 1 tablet by mouth in the morning and at bedtime.     Ferrous Sulfate  (IRON PO) Take 1 tablet by mouth in the morning.     fluconazole  (DIFLUCAN ) 100 MG tablet Take 1 tablet (100 mg total) by mouth daily. 5 tablet 0   icosapent  Ethyl (VASCEPA ) 1 g capsule Take 1 capsule (1 g total) by mouth 2 (two) times daily. 180 capsule 2   isosorbide  mononitrate (IMDUR ) 30 MG 24 hr tablet TAKE 1/2 TABLET(15 MG) BY MOUTH DAILY 45 tablet 3   rosuvastatin  (CRESTOR ) 40 MG tablet TAKE 1 TABLET(40 MG) BY MOUTH DAILY 90 tablet 2   sertraline  (ZOLOFT ) 50 MG tablet Take 50 mg by mouth every morning.     traZODone  (DESYREL ) 150 MG tablet Take 150 mg by mouth at bedtime.      Allergies: Allergies  Allergen Reactions   Erythromycin Diarrhea and Nausea And Vomiting   Lotensin [Benazepril Hcl] Cough   Macrodantin [Nitrofurantoin] Diarrhea    And night sweats    Social History: Social History   Tobacco Use   Smoking status: Former    Current packs/day: 0.00    Average packs/day: 0.5 packs/day for 38.0 years (19.0 ttl pk-yrs)    Types: Cigarettes    Start date: 11/25/1965    Quit date: 11/26/2003    Years since quitting: 20.6   Smokeless tobacco: Never  Vaping Use   Vaping status: Never Used  Substance Use Topics   Alcohol use: Not Currently   Drug use: Never    Family History Family History  Problem Relation Age of Onset   Hypertension Mother    Diabetes Mellitus II Sister    Hypertension Sister    Hypertension Brother    BRCA 1/2 Neg Hx    Breast cancer Neg Hx     Review of Systems  Gastrointestinal:  Positive for nausea and vomiting.  Genitourinary:  Negative for dysuria, flank pain, frequency, hematuria and urgency.     Objective   Vital signs in last 24 hours: BP 129/61   Pulse (!) 59   Temp (!) 97.3 F (36.3 C) (Oral)   Resp 18   SpO2 97%   Physical Exam General: A&O, resting, appropriate HEENT: [place]/AT Pulmonary: Normal work of  breathing Cardiovascular: no cyanosis Abdomen: Soft, NTTP, nondistended   Most Recent Labs: Lab Results  Component Value Date   WBC 9.3 07/29/2024   HGB 9.9 (L) 07/29/2024   HCT 30.9 (L) 07/29/2024   PLT 254 07/29/2024    Lab Results  Component Value Date   NA 137 07/29/2024   K 4.5 07/29/2024   CL 106 07/29/2024   CO2 21 (L) 07/29/2024   BUN 84 (H) 07/29/2024   CREATININE 6.45 (H) 07/29/2024   CALCIUM  9.7 07/29/2024   MG 1.8 08/03/2023   PHOS 4.1 08/03/2023    Lab Results  Component Value Date   INR 1.3 (H) 08/02/2023  APTT 31 04/21/2022     Urine Culture: @[id number] (laburin,org,[id number],[id number])@   IMAGING: No results found.  ------  Ole Bourdon, NP Pager: [phone]   Please contact the urology consult pager with any further questions/concerns.

## 2024-07-29 NOTE — ED Provider Notes (Signed)
 [place] EMERGENCY DEPARTMENT AT [facility] Provider Note   CSN: [id number] Arrival date & time: 07/29/24  1155     Patient presents with: Abnormal Labs   [name] is a 78 y.o. female.   HPI   Patient has history of hyperlipidemia depression anxiety hypertension CHF coronary artery disease, pulmonary embolism bladder cancer ureteral cancer.  Patient states she has had a prior nephrectomy.  She has had recurrent issues with ureteral obstruction requiring stent placement.  Patient states over the last couple weeks she has been having some episodes of nausea vomiting.  She has felt chilled at times and has been feeling more fatigued.  Patient states she went saw her urologist this past week.  They were planning on possibly exchanging her stent.  She went to the oncologist office today.  While she was there she had laboratory testing including a CBC and a metabolic panel.  Patient's creatinine was noted to be increased now at 6.45 when the previous value was 3.48.  She was instructed come to the ED for further treatment  Patient states she has not had any nausea vomiting or diarrhea in the last couple of days.  She has however had some nausea and decreased appetite  Prior to Admission medications   Medication Sig Start Date End Date Taking? Authorizing Provider  acetaminophen  (TYLENOL ) 500 MG tablet Take 500-1,000 mg by mouth every 6 (six) hours as needed for mild pain.    [provider]  aspirin  EC 81 MG tablet Take 1 tablet (81 mg total) by mouth daily. Swallow whole. 01/09/23   Parthenia Olivia HERO, PA-C  carvedilol  (COREG ) 6.25 MG tablet TAKE 1 TABLET(6.25 MG) BY MOUTH TWICE DAILY WITH A MEAL 02/04/24   Chandrasekhar, Mahesh A, MD  cholecalciferol  (VITAMIN D3) 25 MCG (1000 UNIT) tablet Take 1,000 Units by mouth every morning.    [provider]  Famotidine  (HEARTBURN RELIEF PO) Take 1 tablet by mouth in the morning and at bedtime.    [provider]  Ferrous Sulfate  (IRON PO) Take 1 tablet by mouth in the morning.    [provider]  fluconazole  (DIFLUCAN ) 100 MG tablet Take 1 tablet (100 mg total) by mouth daily. 04/03/24   Darci Pore, MD  icosapent  Ethyl (VASCEPA ) 1 g capsule Take 1 capsule (1 g total) by mouth 2 (two) times daily. 04/09/24   Santo Stanly LABOR, MD  isosorbide  mononitrate (IMDUR ) 30 MG 24 hr tablet TAKE 1/2 TABLET(15 MG) BY MOUTH DAILY 03/06/24   Chandrasekhar, Mahesh A, MD  rosuvastatin  (CRESTOR ) 40 MG tablet TAKE 1 TABLET(40 MG) BY MOUTH DAILY 04/29/24   Chandrasekhar, Mahesh A, MD  sertraline  (ZOLOFT ) 50 MG tablet Take 50 mg by mouth every morning.    [provider]  traZODone  (DESYREL ) 150 MG tablet Take 150 mg by mouth at bedtime. 08/24/20   [provider]    Allergies: Erythromycin, Lotensin [benazepril hcl], and Macrodantin [nitrofurantoin]    Review of Systems  Updated Vital Signs BP 129/61   Pulse (!) 59   Temp (!) 97.3 F (36.3 C) (Oral)   Resp 18   SpO2 97%   Physical Exam Vitals and nursing note reviewed.  Constitutional:      Appearance: She is well-developed. She is not diaphoretic.  HENT:     Head: Normocephalic and atraumatic.     Right Ear: External ear normal.     Left Ear: External ear normal.  Eyes:     General:  No scleral icterus.       Right eye: No discharge.        Left eye: No discharge.     Conjunctiva/sclera: Conjunctivae normal.  Neck:     Trachea: No tracheal deviation.  Cardiovascular:     Rate and Rhythm: Normal rate and regular rhythm.  Pulmonary:     Effort: Pulmonary effort is normal. No respiratory distress.     Breath sounds: Normal breath sounds. No stridor. No wheezing or rales.  Abdominal:     General: Bowel sounds are normal. There is no distension.     Palpations: Abdomen is soft.     Tenderness: There is no abdominal tenderness. There is no guarding or rebound.  Musculoskeletal:        General: No tenderness or  deformity.     Cervical back: Neck supple.  Skin:    General: Skin is warm and dry.     Findings: No rash.  Neurological:     General: No focal deficit present.     Mental Status: She is alert.     Cranial Nerves: No cranial nerve deficit, dysarthria or facial asymmetry.     Sensory: No sensory deficit.     Motor: No abnormal muscle tone or seizure activity.     Coordination: Coordination normal.  Psychiatric:        Mood and Affect: Mood normal.     (all labs ordered are listed, but only abnormal results are displayed) Labs Reviewed  URINALYSIS, W/ REFLEX TO CULTURE (INFECTION SUSPECTED) - Abnormal; Notable for the following components:      Result Value   APPearance CLOUDY (*)    Hgb urine dipstick MODERATE (*)    Protein, ur 30 (*)    Leukocytes,Ua LARGE (*)    Bacteria, UA MANY (*)    All other components within normal limits  BASIC METABOLIC PANEL WITH GFR - Abnormal; Notable for the following components:   CO2 16 (*)    BUN 79 (*)    Creatinine, Ser 5.77 (*)    Calcium  8.8 (*)    GFR, Estimated 7 (*)    All other components within normal limits  URINE CULTURE  I-STAT CG4 LACTIC ACID, ED  I-STAT CG4 LACTIC ACID, ED    EKG: None  Radiology: No results found.   Procedures   Medications Ordered in the ED  cefTRIAXone  (ROCEPHIN ) 1 g in sodium chloride  0.9 % 100 mL IVPB (has no administration in time range)  lactated ringers  bolus 1,000 mL (1,000 mLs Intravenous Bolus from Bag 07/29/24 1249)  ondansetron  (ZOFRAN ) injection 4 mg (4 mg Intravenous Given 07/29/24 1252)    Clinical Course as of 07/29/24 1423  Tue Jul 29, 2024  1242 Case discussed with urology, NP Sattenfeld [JK]  1358 Urinalysis, w/ Reflex to Culture (Infection Suspected) -Urine, Clean Catch(!) No suggestive of UTI.  Lactic acid level normal. [JK]  1420 Case discussed with hospitalist service regarding admission [JK]    Clinical Course User Index [JK] Randol Simmonds, MD                                  Medical Decision Making Problems Addressed: Acute cystitis without hematuria: acute illness or injury that poses a threat to life or bodily functions AKI (acute kidney injury): acute illness or injury that poses a threat to life or bodily functions  Amount and/or Complexity of Data Reviewed Labs: ordered.  Decision-making details documented in ED Course. Radiology: ordered.  Risk Prescription drug management. Decision regarding hospitalization.   Presented to the ED for evaluation of abnormal laboratory tests, elevated creatinine.  Patient has had some intermittent episodes of nausea vomiting although not in the last couple of days.  Patient does have complex history of urothelial cancer, prior nephrectomy indwelling stents. Patient's labs this morning did show elevated creatinine.  She has been treated with IV fluids.  Urinalysis also concerning for possible UTI.  No lactic acidosis this time to suggest systemic infection evolving sepsis.  I have ordered IV antibiotics.  I have consulted with urology and the hospitalist service.  Patient will be admitted to the hospital for further treatment.  CT abdomen pelvis is been ordered and is still pending at this time.  We will evaluate for signs of worsening hydronephrosis that would require urgent stent exchange      Final diagnoses:  AKI (acute kidney injury)  Acute cystitis without hematuria    ED Discharge Orders     None          Randol Simmonds, MD 07/29/24 1424

## 2024-07-30 DIAGNOSIS — C689 Malignant neoplasm of urinary organ, unspecified: Secondary | ICD-10-CM

## 2024-07-30 DIAGNOSIS — I10 Essential (primary) hypertension: Secondary | ICD-10-CM

## 2024-07-30 DIAGNOSIS — I5020 Unspecified systolic (congestive) heart failure: Secondary | ICD-10-CM

## 2024-07-30 DIAGNOSIS — N179 Acute kidney failure, unspecified: Secondary | ICD-10-CM | POA: Diagnosis not present

## 2024-07-30 DIAGNOSIS — F32A Depression, unspecified: Secondary | ICD-10-CM

## 2024-07-30 DIAGNOSIS — N184 Chronic kidney disease, stage 4 (severe): Secondary | ICD-10-CM

## 2024-07-30 DIAGNOSIS — D631 Anemia in chronic kidney disease: Secondary | ICD-10-CM

## 2024-07-30 DIAGNOSIS — E782 Mixed hyperlipidemia: Secondary | ICD-10-CM

## 2024-07-30 LAB — CBC
HCT: 28.9 % — ABNORMAL LOW (ref 36.0–46.0)
Hemoglobin: 8.8 g/dL — ABNORMAL LOW (ref 12.0–15.0)
MCH: 29.6 pg (ref 26.0–34.0)
MCHC: 30.4 g/dL (ref 30.0–36.0)
MCV: 97.3 fL (ref 80.0–100.0)
Platelets: 234 K/uL (ref 150–400)
RBC: 2.97 MIL/uL — ABNORMAL LOW (ref 3.87–5.11)
RDW: 13.9 % (ref 11.5–15.5)
WBC: 7.8 K/uL (ref 4.0–10.5)
nRBC: 0 % (ref 0.0–0.2)

## 2024-07-30 LAB — FERRITIN: Ferritin: 639 ng/mL — ABNORMAL HIGH (ref 11–307)

## 2024-07-30 LAB — CREATININE, URINE, RANDOM: Creatinine, Urine: 45 mg/dL

## 2024-07-30 LAB — SODIUM, URINE, RANDOM: Sodium, Ur: 75 mmol/L

## 2024-07-30 LAB — IRON AND TIBC
Iron: 32 ug/dL (ref 28–170)
Saturation Ratios: 15 % (ref 10.4–31.8)
TIBC: 213 ug/dL — ABNORMAL LOW (ref 250–450)
UIBC: 181 ug/dL

## 2024-07-30 LAB — COMPREHENSIVE METABOLIC PANEL WITH GFR
ALT: 20 U/L (ref 0–44)
AST: 17 U/L (ref 15–41)
Albumin: 3.3 g/dL — ABNORMAL LOW (ref 3.5–5.0)
Alkaline Phosphatase: 67 U/L (ref 38–126)
Anion gap: 13 (ref 5–15)
BUN: 79 mg/dL — ABNORMAL HIGH (ref 8–23)
CO2: 18 mmol/L — ABNORMAL LOW (ref 22–32)
Calcium: 8.9 mg/dL (ref 8.9–10.3)
Chloride: 110 mmol/L (ref 98–111)
Creatinine, Ser: 5.66 mg/dL — ABNORMAL HIGH (ref 0.44–1.00)
GFR, Estimated: 7 mL/min — ABNORMAL LOW (ref >60)
Glucose, Bld: 84 mg/dL (ref 70–99)
Potassium: 5.1 mmol/L (ref 3.5–5.1)
Sodium: 141 mmol/L (ref 135–145)
Total Bilirubin: 0.2 mg/dL (ref 0.0–1.2)
Total Protein: 7.1 g/dL (ref 6.5–8.1)

## 2024-07-30 LAB — FOLATE: Folate: 8.7 ng/mL (ref >5.9)

## 2024-07-30 LAB — VITAMIN B12: Vitamin B-12: 653 pg/mL (ref 180–914)

## 2024-07-30 MED ORDER — SODIUM CHLORIDE 0.9 % IV SOLN: INTRAVENOUS | Status: DC

## 2024-07-30 MED ORDER — ROSUVASTATIN CALCIUM 10 MG PO TABS
10.0000 mg | ORAL_TABLET | Freq: Every day | ORAL | Status: DC
  Administered 2024-07-31 – 2024-08-05 (×6): 10 mg via ORAL
  Filled 2024-07-30 (×6): qty 1

## 2024-07-30 MED ORDER — POLYSACCHARIDE IRON COMPLEX 150 MG PO CAPS
150.0000 mg | ORAL_CAPSULE | Freq: Every day | ORAL | Status: DC
  Administered 2024-07-30 – 2024-08-05 (×7): 150 mg via ORAL
  Filled 2024-07-30 (×7): qty 1

## 2024-07-30 MED ORDER — CEFTRIAXONE SODIUM 2 G IJ SOLR
2.0000 g | INTRAMUSCULAR | Status: DC
  Administered 2024-07-30: 2 g via INTRAVENOUS
  Filled 2024-07-30: qty 20

## 2024-07-30 MED ORDER — FAMOTIDINE 20 MG PO TABS
10.0000 mg | ORAL_TABLET | Freq: Every day | ORAL | Status: DC
  Administered 2024-07-31 – 2024-08-05 (×6): 10 mg via ORAL
  Filled 2024-07-30 (×6): qty 1

## 2024-07-30 NOTE — Progress Notes (Signed)
 Subjective: Patient resting in bed on rounds.  In good spirits with no specific urologic complaints.  Reviewed case and plan and all questions were answered.  Objective: Vital signs in last 24 hours: Temp:  [97.3 F (36.3 C)-98 F (36.7 C)] 97.5 F (36.4 C) (10/22 0947) Pulse Rate:  [55-68] 68 (10/22 0947) Resp:  [17-20] 20 (10/22 0244) BP: (124-149)/(59-69) 143/59 (10/22 0947) SpO2:  [95 %-100 %] 100 % (10/22 0947) Weight:  [82.3 kg-82.6 kg] 82.3 kg (10/22 0500)  Assessment/Plan: #AOCKD  Interval improvement today.  Encouraged by what patient describes as good urine output, though this is not being recorded.  Orders placed for strict I's and O's Continue IV rehydration and close monitoring. Ureteral stent due to be exchanged sometime in the near future.  Ideally would not be instrumented with active infection.  Will reconsider should patient decompensate acutely. Will follow peripherally  Intake/Output from previous day: 10/21 0701 - 10/22 0700 In: 1783.8 [P.O.:120; I.V.:563.8; IV Piggyback:1100] Out: -   Intake/Output this shift: Total I/O In: 120 [P.O.:120] Out: -   Physical Exam:  General: Alert and oriented CV: No cyanosis Lungs: equal chest rise   Lab Results: Recent Labs    07/29/24 0947 07/30/24 0519  HGB 9.9* 8.8*  HCT 30.9* 28.9*   BMET Recent Labs    07/29/24 0947 07/29/24 1335 07/30/24 0519  NA 137 136 141  K 4.5 4.9 5.1  CL 106 106 110  CO2 21* 16* 18*  GLUCOSE 140* 86 84  BUN 84* 79* 79*  CREATININE 6.45* 5.77* 5.66*  CALCIUM  9.7 8.8* 8.9  HGB 9.9*  --  8.8*  WBC 9.3  --  7.8     Studies/Results: CT ABDOMEN PELVIS WO CONTRAST Result Date: 07/29/2024 CLINICAL DATA:  Nausea and vomiting for 1 week, fever, prior right nephrectomy, indwelling left ureteral stent, history of left ureteral cancer EXAM: CT ABDOMEN AND PELVIS WITHOUT CONTRAST TECHNIQUE: Multidetector CT imaging of the abdomen and pelvis was performed following the  standard protocol without IV contrast. RADIATION DOSE REDUCTION: This exam was performed according to the departmental dose-optimization program which includes automated exposure control, adjustment of the mA and/or kV according to patient size and/or use of iterative reconstruction technique. COMPARISON:  03/27/2024 FINDINGS: Lower chest: No acute pleural or parenchymal lung disease. Small hiatal hernia. Hepatobiliary: Unremarkable unenhanced appearance of the liver and gallbladder. No biliary duct dilation. Pancreas: Unremarkable unenhanced appearance. Spleen: Unremarkable unenhanced appearance. Adrenals/Urinary Tract: The adrenals are unremarkable. Prior right nephro ureterectomy with no abnormalities in the nephrectomy bed. There is an indwelling left ureteral stent extending from the left renal pelvis into the bladder lumen. No urinary tract calculi or obstructive uropathy. There is mild fat stranding surrounding the left renal pelvis and left ureter, which could be related to infection or patient's known history of neoplasm. The bladder is unremarkable. Stomach/Bowel: No bowel obstruction or ileus. Normal appendix right lower quadrant. Distal colonic diverticulosis without diverticulitis. No bowel wall thickening or inflammatory change. Small hiatal hernia. Vascular/Lymphatic: Stable aortic atherosclerosis. Assessment of the vascular lumen is limited without intravenous contrast. Multiple enlarged retroperitoneal lymph nodes are again identified, largest in the left para-aortic region measuring 11 mm in short axis, reference image 45/2, stable. No new adenopathy. Reproductive: Status post hysterectomy. No adnexal masses. Other: No free fluid or free intraperitoneal gas. No abdominal wall hernia. Musculoskeletal: No acute or destructive bony abnormalities. Reconstructed images demonstrate no additional findings. IMPRESSION: 1. Indwelling left ureteral stent as above, with  mild fat stranding surrounding the left  renal pelvis and left ureter consistent with known history of neoplasm and/or infection. Please correlate with urinalysis. 2. Persistent left para-aortic lymphadenopathy, concerning for metastatic disease. 3. Distal colonic diverticulosis without diverticulitis. 4. Prior right nephro ureterectomy. 5.  Aortic Atherosclerosis (ICD10-I70.0). Electronically Signed   By: Ozell Daring M.D.   On: 07/29/2024 15:30      LOS: 1 day   Ole Bourdon, NP [facility] Pager: [phone]  07/30/2024, 11:23 AM

## 2024-07-30 NOTE — Evaluation (Signed)
 Physical Therapy Evaluation Patient Details Name: [name] MRN: [id number] DOB: 01/14/1946 Today's Date: 07/30/2024  History of Present Illness  78 yo female admitted with AKI on CKD. Hx of PE, CAD, OSA, bladder Ca s/p R nephroureterectomy, CHF, CKD  Clinical Impression  On eval, pt was CGA-Min for mobility. She ambulated ~200 feet without a device. LOB x 1 when turning/changing direction. Tolerated distance well. Will plan to follow pt during this hospital stay. Will recommend HHPT f/u, if pt is agreeable.         If plan is discharge home, recommend the following: A little help with walking and/or transfers;A little help with bathing/dressing/bathroom;Assistance with cooking/housework;Assist for transportation;Help with stairs or ramp for entrance   Can travel by private vehicle        Equipment Recommendations None recommended by PT  Recommendations for Other Services       Functional Status Assessment Patient has had a recent decline in their functional status and demonstrates the ability to make significant improvements in function in a reasonable and predictable amount of time.     Precautions / Restrictions Precautions Precautions: Fall Restrictions Weight Bearing Restrictions Per Provider Order: No      Mobility  Bed Mobility Overal bed mobility: Needs Assistance Bed Mobility: Supine to Sit, Sit to Supine     Supine to sit: HOB elevated Sit to supine: HOB elevated   General bed mobility comments: Supv for safety, lines    Transfers Overall transfer level: Needs assistance Equipment used: None Transfers: Sit to/from Stand Sit to Stand: Supervision           General transfer comment: Supv for safety    Ambulation/Gait Ambulation/Gait assistance: Contact guard assist, Min assist Gait Distance (Feet): 200 Feet Assistive device: None Gait Pattern/deviations: Step-through pattern, Decreased stride length       General Gait Details: LOB x 1.  Otherwise, CGA. Tolerated distance well.  Stairs            Wheelchair Mobility     Tilt Bed    Modified Rankin (Stroke Patients Only)       Balance Overall balance assessment: Needs assistance         Standing balance support: During functional activity Standing balance-Leahy Scale: Fair                               Pertinent Vitals/Pain Pain Assessment Pain Assessment: No/denies pain    Home Living Family/patient expects to be discharged to:: Private residence Living Arrangements: Spouse/significant other Available Help at Discharge: Family Type of Home: House Home Access: Stairs to enter Entrance Stairs-Rails: Right Entrance Stairs-Number of Steps: 5-6   Home Layout: One level Home Equipment: Agricultural consultant (2 wheels);Cane - single point;Shower seat - built in      Prior Function Prior Level of Function : Independent/Modified Independent;Driving             Mobility Comments: rarely uses RW ADLs Comments: Ind with ADLs, IADLs, home mgt, grocery shops     Extremity/Trunk Assessment   Upper Extremity Assessment Upper Extremity Assessment: Defer to OT evaluation    Lower Extremity Assessment Lower Extremity Assessment: Generalized weakness    Cervical / Trunk Assessment Cervical / Trunk Assessment: Normal  Communication   Communication Communication: No apparent difficulties    Cognition Arousal: Alert Behavior During Therapy: [facility] for tasks assessed/performed  Following commands: Intact       Cueing       General Comments      Exercises     Assessment/Plan    PT Assessment Patient needs continued PT services  PT Problem List Decreased strength;Decreased activity tolerance;Decreased balance;Decreased mobility       PT Treatment Interventions DME instruction;Gait training;Functional mobility training;Therapeutic exercise;Therapeutic activities;Patient/family  education;Balance training    PT Goals (Current goals can be found in the Care Plan section)  Acute Rehab PT Goals Patient Stated Goal: home soon PT Goal Formulation: With patient Time For Goal Achievement: 08/13/24 Potential to Achieve Goals: Good    Frequency Min 3X/week     Co-evaluation               AM-PAC PT 6 Clicks Mobility  Outcome Measure Help needed turning from your back to your side while in a flat bed without using bedrails?: None Help needed moving from lying on your back to sitting on the side of a flat bed without using bedrails?: None Help needed moving to and from a bed to a chair (including a wheelchair)?: None Help needed standing up from a chair using your arms (e.g., wheelchair or bedside chair)?: None Help needed to walk in hospital room?: A Little Help needed climbing 3-5 steps with a railing? : A Little 6 Click Score: 22    End of Session Equipment Utilized During Treatment: Gait belt Activity Tolerance: Patient tolerated treatment well Patient left: in bed;with call bell/phone within reach;with bed alarm set   PT Visit Diagnosis: Muscle weakness (generalized) (M62.81);Unsteadiness on feet (R26.81);Difficulty in walking, not elsewhere classified (R26.2)    Time: 8494-8484 PT Time Calculation (min) (ACUTE ONLY): 10 min   Charges:   PT Evaluation $PT Eval Low Complexity: 1 Low   PT General Charges $$ ACUTE PT VISIT: 1 Visit           Dannial SQUIBB, PT Acute Rehabilitation  Office: [phone]

## 2024-07-30 NOTE — Evaluation (Addendum)
 Occupational Therapy Evaluation Patient Details Name: [name] MRN: [id number] DOB: June 07, 1946 Today's Date: 07/30/2024   History of Present Illness   [name] is a 77 y.o. female with medical history significant of HLD, Hx of PE,HTN,CAD non obstructive CAD, OSA not on CPAP, bladder cancer s/p right nephroureterectomy for cancer ,anemia,GERD, CHF pef ef 63%,, hx of PE 2022, CKD IV with progression followed by nephrology, presents to ED  with episodes of  n/v/d x 1 week duration, also reports fatigue, poor appetite and associated chills. She denies fevers     Clinical Impressions Pt presents with decline in function and safety with ADLs and ADL mobility with impaired strength and endurance. Pt reports that PTA she lives with her husband and was Ind with ADLs, home mgt mobility (rarely uses RW), drives and grocery shops. Pt currently requires CGA with ADLs standing/sit-stand and with STS, walking and transfers. Pt very pleasant and cooperatve. OT will follow acutely to maximize level of function and safety     If plan is discharge home, recommend the following:   A little help with bathing/dressing/bathroom;A little help with walking and/or transfers;Assistance with cooking/housework;Assist for transportation;Help with stairs or ramp for entrance     Functional Status Assessment   Patient has had a recent decline in their functional status and demonstrates the ability to make significant improvements in function in a reasonable and predictable amount of time.     Equipment Recommendations   None recommended by OT     Recommendations for Other Services    PT consult     Precautions/Restrictions   Precautions Precautions: Fall Restrictions Weight Bearing Restrictions Per Provider Order: No     Mobility Bed Mobility Overal bed mobility: Needs Assistance Bed Mobility: Supine to Sit, Sit to Supine     Supine to sit: Supervision, HOB elevated Sit to  supine: Supervision        Transfers Overall transfer level: Needs assistance Equipment used: 1 person hand held assist Transfers: Sit to/from Stand, Bed to chair/wheelchair/BSC Sit to Stand: Contact guard assist     Step pivot transfers: Contact guard assist     General transfer comment: pushed IV pole on way back from bathroom      Balance Overall balance assessment: Mild deficits observed, not formally tested                                         ADL either performed or assessed with clinical judgement   ADL Overall ADL's : Needs assistance/impaired Eating/Feeding: Independent;Sitting   Grooming: Wash/dry hands;Wash/dry face;Contact guard assist;Standing   Upper Body Bathing: Contact guard assist;Standing   Lower Body Bathing: Contact guard assist;Sit to/from stand   Upper Body Dressing : Contact guard assist;Standing   Lower Body Dressing: Contact guard assist;Sit to/from stand   Toilet Transfer: Contact guard assist;Ambulation;Regular Toilet;Grab bars   Toileting- Clothing Manipulation and Hygiene: Contact guard assist;Sit to/from stand       Functional mobility during ADLs: Contact guard assist;Cueing for safety General ADL Comments: CGA for safety, transfers to commode and shower with no LOB, used grab bar for safety     Vision Baseline Vision/History: 1 Wears glasses Ability to See in Adequate Light: 0 Adequate Patient Visual Report: No change from baseline       Perception         Praxis  Pertinent Vitals/Pain Pain Assessment Pain Assessment: No/denies pain     Extremity/Trunk Assessment Upper Extremity Assessment Upper Extremity Assessment: Right hand dominant;Generalized weakness   Lower Extremity Assessment Lower Extremity Assessment: Defer to PT evaluation   Cervical / Trunk Assessment Cervical / Trunk Assessment: Normal   Communication Communication Communication: No apparent difficulties    Cognition Arousal: Alert Behavior During Therapy: WFL for tasks assessed/performed Cognition: No apparent impairments                               Following commands: Intact       Cueing  General Comments          Exercises     Shoulder Instructions      Home Living Family/patient expects to be discharged to:: Private residence Living Arrangements: Spouse/significant other Available Help at Discharge: Family Type of Home: House Home Access: Stairs to enter Secretary/administrator of Steps: 5-6 Entrance Stairs-Rails: Right Home Layout: One level     Bathroom Shower/Tub: Producer, television/film/video: Standard     Home Equipment: Agricultural consultant (2 wheels);Cane - single point;Shower seat - built in          Prior Functioning/Environment Prior Level of Function : Independent/Modified Independent;Driving             Mobility Comments: rarely uses RW ADLs Comments: Ind with ADLs, IADLs, home mgt, grocery shops    OT Problem List: Decreased strength;Decreased activity tolerance;Impaired balance (sitting and/or standing)   OT Treatment/Interventions: Self-care/ADL training;Therapeutic exercise;Patient/family education;Therapeutic activities;DME and/or AE instruction      OT Goals(Current goals can be found in the care plan section)   Acute Rehab OT Goals Patient Stated Goal: go home OT Goal Formulation: With patient Time For Goal Achievement: 08/13/24 Potential to Achieve Goals: Good ADL Goals Pt Will Perform Grooming: with supervision;with set-up;standing Pt Will Perform Upper Body Bathing: with supervision;with set-up;standing Pt Will Perform Lower Body Bathing: with supervision;with set-up;sit to/from stand Pt Will Perform Upper Body Dressing: with supervision;with set-up;standing Pt Will Perform Lower Body Dressing: with supervision;with set-up;sit to/from stand Pt Will Transfer to Toilet: with supervision;with modified  independence;ambulating Pt Will Perform Toileting - Clothing Manipulation and hygiene: with supervision;with modified independence;sit to/from stand Pt Will Perform Tub/Shower Transfer: with supervision;with modified independence;ambulating   OT Frequency:  Min 2X/week    Co-evaluation              AM-PAC OT 6 Clicks Daily Activity     Outcome Measure Help from another person eating meals?: None Help from another person taking care of personal grooming?: A Little Help from another person toileting, which includes using toliet, bedpan, or urinal?: A Little Help from another person bathing (including washing, rinsing, drying)?: A Little Help from another person to put on and taking off regular upper body clothing?: A Little Help from another person to put on and taking off regular lower body clothing?: A Little 6 Click Score: 19   End of Session Equipment Utilized During Treatment: Gait belt Nurse Communication: Mobility status  Activity Tolerance: Patient tolerated treatment well Patient left: in bed;with call bell/phone within reach;with bed alarm set;with family/visitor present  OT Visit Diagnosis: Other abnormalities of gait and mobility (R26.89);Muscle weakness (generalized) (M62.81)                Time: 8761-8695 OT Time Calculation (min): 26 min Charges:  OT General Charges $OT Visit: 1 Visit OT Treatments $  Self Care/Home Management : 8-22 mins $Therapeutic Activity: 8-22 mins   Jacques Karna Loose 07/30/2024, 1:40 PM

## 2024-07-30 NOTE — Progress Notes (Signed)
 PROGRESS NOTE    [name]  FMW:[id number] DOB: Feb 08, 1946 DOA: 07/29/2024 PCP: Chrystal Lamarr RAMAN, MD    Chief Complaint  Patient presents with   Abnormal Labs    Brief Narrative:  Patient a pleasant 78 year old female history of hyperlipidemia, history of PE, hypertension, CAD nonobstructive, OSA not on CPAP, bladder cancer status post right nephroureterectomy for cancer, anemia, GERD, CHF, history of PE 2022, CKD stage IV with progression being followed by nephrology with baseline creatinine of 3.3 presenting to the ED with a 1 week history of nausea vomiting diarrhea.  Patient seen by oncologist on day of admission labs obtained with a creatinine of 6.45 with a repeat in the ED of 5.77.  Patient noted to be following urology in the outpatient setting.  Urinalysis done concerning for UTI.  CT abdomen and pelvis assessed by urology and no indication for stent exchange.  Patient placed on IV antibiotics, IV fluids, urology following.   Assessment & Plan:   Principal Problem:   Acute kidney injury superimposed on stage 4 chronic kidney disease (HCC) Active Problems:   Essential hypertension   Mixed hyperlipidemia   Chronic combined systolic and diastolic CHF (congestive heart failure) (HCC)   Urothelial carcinoma (HCC)   Depression   CAD in native artery   HFrEF (heart failure with reduced ejection fraction) (HCC)   AKI (acute kidney injury)   Anemia due to chronic kidney disease  #1 AKI on CKD stage IV -Baseline creatinine approximately 3.3 as of 03/2024. - Creatinine on admission as high as 6.45. - Likely secondary to a prerenal azotemia in the setting of nausea vomiting diarrhea/GI losses versus postop secondary to ureteral stent. - CT abdomen and pelvis with indwelling left ureteral stent, mild fat stranding surrounding the left renal pelvis and left ureter consistent with known history of neoplasm and no infection.  Persistent left periaortic lymphadenopathy  concerning for metastatic disease.  Distal colonic diverticulosis without diverticulitis.  Prior right nephro ureterectomy.  Aortic atherosclerosis. - Patient noted to be on IV fluids. - Urine sodium done today of 75, urine creatinine of 45. - Renal function slowly trending down. - Increase IV fluid rate to 100 cc an hour for the next 24 hours. - Monitor urine output. -Patient assessed by urology if you ureteral stent can be exchanged sometime in the near future and ideally would not be instrumented on with active infection however may reconsider should patient decompensate. -Urology following. - Follow.  2.  UTI -Preliminary urine cultures with > 100,000 colonies of Enterococcus faecalis with sensitivities pending. - Increase IV Rocephin  to 2 g daily. - Supportive care.  3.  Normocytic anemia - Patient with no active bleeding. - Hemoglobin 8.8 this morning. - Follow H&H.  4.  History of bladder cancer status post right nephroureterectomy - Outpatient follow-up with primary oncologist.  5.  Hypertension -Continue carvedilol  and Imdur .  6.  CAD, nonobstructive -Continue aspirin , statin, Imdur , carvedilol .  7.  OSA -As needed O2 at bedtime.  8.  HFpEF -Stable. - Noted to have been on diuretics prior to admission which are currently on hold due to presentation with AKI on CKD stage IV. - Monitor closely for volume overload with hydration.  9.  GERD -Continue Pepcid .  10.  Hyperlipidemia -Continue Crestor .   DVT prophylaxis: Heparin  Code Status: Full Family Communication: Updated patient.  No family at bedside. Disposition: Home when clinically improved and renal function back to baseline.  Status is: Inpatient Remains inpatient appropriate because: Severity of  illness   Consultants:  Urology: Dr. Alvaro 07/29/2024  Procedures:  CT abdomen and pelvis 07/29/2024   Antimicrobials:  Anti-infectives (From admission, onward)    Start     Dose/Rate Route Frequency  Ordered Stop   07/30/24 1400  cefTRIAXone  (ROCEPHIN ) 1 g in sodium chloride  0.9 % 100 mL IVPB  Status:  Discontinued        1 g 200 mL/hr over 30 Minutes Intravenous Every 24 hours 07/29/24 1452 07/30/24 0829   07/30/24 1400  cefTRIAXone  (ROCEPHIN ) 2 g in sodium chloride  0.9 % 100 mL IVPB        2 g 200 mL/hr over 30 Minutes Intravenous Every 24 hours 07/30/24 0829 08/03/24 1359   07/29/24 1400  cefTRIAXone  (ROCEPHIN ) 1 g in sodium chloride  0.9 % 100 mL IVPB        1 g 200 mL/hr over 30 Minutes Intravenous  Once 07/29/24 1359 07/29/24 1458         Subjective: Patient laying in bed.  States having good urine output.  Denies any chest pain or shortness of breath.  No abdominal pain.  Denies any further nausea vomiting or diarrhea.  Overall feeling better than she did when she presented to the ED.  Stated saw urology this morning.  Objective: Vitals:   07/30/24 0244 07/30/24 0500 07/30/24 0947 07/30/24 1254  BP: (!) 149/69  (!) 143/59 (!) 147/58  Pulse: 61  68 62  Resp: 20   16  Temp: 98 F (36.7 C)  (!) 97.5 F (36.4 C) (!) 97.5 F (36.4 C)  TempSrc: Oral  Oral Oral  SpO2: 99%  100% 98%  Weight:  82.3 kg    Height:        Intake/Output Summary (Last 24 hours) at 07/30/2024 1803 Last data filed at 07/30/2024 1756 Gross per 24 hour  Intake 1466 ml  Output 700 ml  Net 766 ml   Filed Weights   07/29/24 1807 07/30/24 0500  Weight: 82.6 kg 82.3 kg    Examination:  General exam: Appears calm and comfortable  Respiratory system: Clear to auscultation anterior lung fields.  No wheezes, no crackles, no rhonchi.  Fair air movement.  Speaking in full sentences.SABRA Respiratory effort normal. Cardiovascular system: S1 & S2 heard, RRR. No JVD, murmurs, rubs, gallops or clicks. No pedal edema. Gastrointestinal system: Abdomen is nondistended, soft and nontender. No organomegaly or masses felt. Normal bowel sounds heard. Central nervous system: Alert and oriented. No focal neurological  deficits. Extremities: Symmetric 5 x 5 power. Skin: No rashes, lesions or ulcers Psychiatry: Judgement and insight appear normal. Mood & affect appropriate.     Data Reviewed: I have personally reviewed following labs and imaging studies  CBC: Recent Labs  Lab 07/29/24 0947 07/30/24 0519  WBC 9.3 7.8  NEUTROABS 6.0  --   HGB 9.9* 8.8*  HCT 30.9* 28.9*  MCV 92.8 97.3  PLT 254 234    Basic Metabolic Panel: Recent Labs  Lab 07/29/24 0947 07/29/24 1335 07/30/24 0519  NA 137 136 141  K 4.5 4.9 5.1  CL 106 106 110  CO2 21* 16* 18*  GLUCOSE 140* 86 84  BUN 84* 79* 79*  CREATININE 6.45* 5.77* 5.66*  CALCIUM  9.7 8.8* 8.9    GFR: Estimated Creatinine Clearance: 8.6 mL/min (A) (by C-G formula based on SCr of 5.66 mg/dL (H)).  Liver Function Tests: Recent Labs  Lab 07/29/24 0947 07/30/24 0519  AST 17 17  ALT 20 20  ALKPHOS 61 67  BILITOT 0.2 0.2  PROT 8.1 7.1  ALBUMIN  3.5 3.3*    CBG: No results for input(s): GLUCAP in the last 168 hours.   Recent Results (from the past 240 hours)  Urine Culture     Status: Abnormal (Preliminary result)   Collection Time: 07/29/24 12:48 PM   Specimen: Urine, Random  Result Value Ref Range Status   Specimen Description   Final    URINE, RANDOM Performed at [facility], [address] [place], [place] [address]    Special Requests   Final    NONE Reflexed from [id number] Performed at [facility], [address], [place], [place] [address]    Culture (A)  Final    >=100,000 COLONIES/mL ENTEROCOCCUS FAECALIS SUSCEPTIBILITIES TO FOLLOW Performed at [facility], [address] [place], [place] [address]    Report Status PENDING  Incomplete         Radiology Studies: CT ABDOMEN PELVIS WO CONTRAST Result Date: 07/29/2024 CLINICAL DATA:  Nausea and vomiting for 1 week, fever, prior right nephrectomy, indwelling left ureteral stent, history of left ureteral cancer EXAM: CT  ABDOMEN AND PELVIS WITHOUT CONTRAST TECHNIQUE: Multidetector CT imaging of the abdomen and pelvis was performed following the standard protocol without IV contrast. RADIATION DOSE REDUCTION: This exam was performed according to the departmental dose-optimization program which includes automated exposure control, adjustment of the mA and/or kV according to patient size and/or use of iterative reconstruction technique. COMPARISON:  03/27/2024 FINDINGS: Lower chest: No acute pleural or parenchymal lung disease. Small hiatal hernia. Hepatobiliary: Unremarkable unenhanced appearance of the liver and gallbladder. No biliary duct dilation. Pancreas: Unremarkable unenhanced appearance. Spleen: Unremarkable unenhanced appearance. Adrenals/Urinary Tract: The adrenals are unremarkable. Prior right nephro ureterectomy with no abnormalities in the nephrectomy bed. There is an indwelling left ureteral stent extending from the left renal pelvis into the bladder lumen. No urinary tract calculi or obstructive uropathy. There is mild fat stranding surrounding the left renal pelvis and left ureter, which could be related to infection or patient's known history of neoplasm. The bladder is unremarkable. Stomach/Bowel: No bowel obstruction or ileus. Normal appendix right lower quadrant. Distal colonic diverticulosis without diverticulitis. No bowel wall thickening or inflammatory change. Small hiatal hernia. Vascular/Lymphatic: Stable aortic atherosclerosis. Assessment of the vascular lumen is limited without intravenous contrast. Multiple enlarged retroperitoneal lymph nodes are again identified, largest in the left para-aortic region measuring 11 mm in short axis, reference image 45/2, stable. No new adenopathy. Reproductive: Status post hysterectomy. No adnexal masses. Other: No free fluid or free intraperitoneal gas. No abdominal wall hernia. Musculoskeletal: No acute or destructive bony abnormalities. Reconstructed images  demonstrate no additional findings. IMPRESSION: 1. Indwelling left ureteral stent as above, with mild fat stranding surrounding the left renal pelvis and left ureter consistent with known history of neoplasm and/or infection. Please correlate with urinalysis. 2. Persistent left para-aortic lymphadenopathy, concerning for metastatic disease. 3. Distal colonic diverticulosis without diverticulitis. 4. Prior right nephro ureterectomy. 5.  Aortic Atherosclerosis (ICD10-I70.0). Electronically Signed   By: Ozell Daring M.D.   On: 07/29/2024 15:30        Scheduled Meds:  aspirin  EC  81 mg Oral Daily   carvedilol   6.25 mg Oral BID WC   cholecalciferol   1,000 Units Oral q morning   [START ON 07/31/2024] famotidine   10 mg Oral Daily   heparin   5,000 Units Subcutaneous Q8H   icosapent  Ethyl  1 g Oral BID   iron polysaccharides  150 mg Oral Daily   isosorbide  mononitrate  15 mg Oral Daily   [START ON 07/31/2024] rosuvastatin   10 mg Oral Daily   sertraline   50 mg Oral q morning   traZODone   150 mg Oral QHS   Continuous Infusions:  sodium chloride  100 mL/hr at 07/30/24 1630   cefTRIAXone  (ROCEPHIN )  IV Stopped (07/30/24 1450)     LOS: 1 day    Time spent: 40 minutes    Toribio Hummer, MD [facility]   To contact the attending provider between 7A-7P or the covering provider during after hours 7P-7A, please log into the web site [email or web address] and access using universal [place] password for that web site. If you do not have the password, please call the hospital operator.  07/30/2024, 6:03 PM

## 2024-07-30 NOTE — TOC Progression Note (Signed)
 Transition of Care [facility]) - Progression Note    Patient Details  Name: [name] MRN: [id number] Date of Birth: 28-Feb-1946  [facility]) CM/SW Contact  Masahiro Iglesia, Nathanel, RN Phone Number: 07/30/2024, 11:29 AM  Clinical Narrative: d/c plan home. Has own transport home.Await PT recc.      Expected Discharge Plan: Home/Self Care Barriers to Discharge: Continued Medical Work up               Expected Discharge Plan and Services   Discharge Planning Services: CM Consult   Living arrangements for the past 2 months: Single Family Home                                       Social Drivers of Health (SDOH) Interventions SDOH Screenings   Food Insecurity: No Food Insecurity (07/29/2024)  Housing: Low Risk  (07/29/2024)  Transportation Needs: No Transportation Needs (07/29/2024)  Utilities: Not At Risk (07/29/2024)  Social Connections: Socially Integrated (07/29/2024)  Tobacco Use: Medium Risk (07/29/2024)    Readmission Risk Interventions    03/28/2024    1:20 PM 08/03/2023   10:47 AM 06/01/2022   11:43 AM  Readmission Risk Prevention Plan  Transportation Screening Complete Complete Complete  PCP or Specialist Appt within 3-5 Days Complete Complete   HRI or Home Care Consult Complete Complete   Social Work Consult for Recovery Care Planning/Counseling Complete Complete   Palliative Care Screening Not Applicable Not Applicable   Medication Review Oceanographer) Complete Complete Complete  PCP or Specialist appointment within 3-5 days of discharge   Complete  HRI or Home Care Consult   Complete  SW Recovery Care/Counseling Consult   Complete  Palliative Care Screening   Not Applicable  Skilled Nursing Facility   Not Applicable

## 2024-07-31 DIAGNOSIS — E782 Mixed hyperlipidemia: Secondary | ICD-10-CM | POA: Diagnosis not present

## 2024-07-31 DIAGNOSIS — N179 Acute kidney failure, unspecified: Secondary | ICD-10-CM | POA: Diagnosis not present

## 2024-07-31 DIAGNOSIS — I10 Essential (primary) hypertension: Secondary | ICD-10-CM | POA: Diagnosis not present

## 2024-07-31 DIAGNOSIS — C689 Malignant neoplasm of urinary organ, unspecified: Secondary | ICD-10-CM | POA: Diagnosis not present

## 2024-07-31 LAB — CBC
HCT: 30 % — ABNORMAL LOW (ref 36.0–46.0)
Hemoglobin: 9.1 g/dL — ABNORMAL LOW (ref 12.0–15.0)
MCH: 29.6 pg (ref 26.0–34.0)
MCHC: 30.3 g/dL (ref 30.0–36.0)
MCV: 97.7 fL (ref 80.0–100.0)
Platelets: 232 K/uL (ref 150–400)
RBC: 3.07 MIL/uL — ABNORMAL LOW (ref 3.87–5.11)
RDW: 14 % (ref 11.5–15.5)
WBC: 7.5 K/uL (ref 4.0–10.5)
nRBC: 0 % (ref 0.0–0.2)

## 2024-07-31 LAB — BASIC METABOLIC PANEL WITH GFR
Anion gap: 11 (ref 5–15)
BUN: 66 mg/dL — ABNORMAL HIGH (ref 8–23)
CO2: 19 mmol/L — ABNORMAL LOW (ref 22–32)
Calcium: 8.6 mg/dL — ABNORMAL LOW (ref 8.9–10.3)
Chloride: 110 mmol/L (ref 98–111)
Creatinine, Ser: 4.97 mg/dL — ABNORMAL HIGH (ref 0.44–1.00)
GFR, Estimated: 8 mL/min — ABNORMAL LOW (ref >60)
Glucose, Bld: 130 mg/dL — ABNORMAL HIGH (ref 70–99)
Potassium: 4.6 mmol/L (ref 3.5–5.1)
Sodium: 140 mmol/L (ref 135–145)

## 2024-07-31 LAB — URINE CULTURE: Culture: >=100000 — AB

## 2024-07-31 MED ORDER — SODIUM CHLORIDE 0.9 % IV SOLN
2.0000 g | INTRAVENOUS | Status: DC
  Administered 2024-07-31 – 2024-08-02 (×3): 2 g via INTRAVENOUS
  Filled 2024-07-31 (×4): qty 2000

## 2024-07-31 MED ORDER — SODIUM CHLORIDE 0.9 % IV SOLN: INTRAVENOUS | Status: DC

## 2024-07-31 NOTE — Progress Notes (Signed)
 Subjective: Patient resting in bed on rounds.  In good spirits with no specific urologic complaints.  Reviewed case and plan and all questions were answered.  Objective: Vital signs in last 24 hours: Temp:  [97.5 F (36.4 C)-98.3 F (36.8 C)] 98.2 F (36.8 C) (10/23 0435) Pulse Rate:  [62-68] 64 (10/23 0435) Resp:  [15-18] 15 (10/23 0435) BP: (143-150)/(58-67) 150/62 (10/23 0435) SpO2:  [95 %-100 %] 95 % (10/23 0435) Weight:  [79.3 kg] 79.3 kg (10/23 0438)  Assessment/Plan: #AOCKD  Ongoing improvement in renal indicies. SCr 5.66-->4.97.  Frequent urination. Passing some sediment. Gentle rehydration considering hx of CHF and close monitoring. Ureteral stent due to be exchanged sometime in the near future.  Ideally would not be instrumented with active infection.  Will reconsider should patient decompensate acutely. Will follow peripherally  Intake/Output from previous day: 10/22 0701 - 10/23 0700 In: 1852.2 [P.O.:240; I.V.:1512.2; IV Piggyback:100] Out: 2425 [Urine:2425]  Intake/Output this shift: Total I/O In: 240 [P.O.:240] Out: 200 [Urine:200]  Physical Exam:  General: Alert and oriented CV: No cyanosis Lungs: equal chest rise   Lab Results: Recent Labs    07/29/24 0947 07/30/24 0519 07/31/24 0828  HGB 9.9* 8.8* 9.1*  HCT 30.9* 28.9* 30.0*   BMET Recent Labs    07/30/24 0519 07/31/24 0828  NA 141 140  K 5.1 4.6  CL 110 110  CO2 18* 19*  GLUCOSE 84 130*  BUN 79* 66*  CREATININE 5.66* 4.97*  CALCIUM  8.9 8.6*  HGB 8.8* 9.1*  WBC 7.8 7.5     Studies/Results: CT ABDOMEN PELVIS WO CONTRAST Result Date: 07/29/2024 CLINICAL DATA:  Nausea and vomiting for 1 week, fever, prior right nephrectomy, indwelling left ureteral stent, history of left ureteral cancer EXAM: CT ABDOMEN AND PELVIS WITHOUT CONTRAST TECHNIQUE: Multidetector CT imaging of the abdomen and pelvis was performed following the standard protocol without IV contrast. RADIATION DOSE  REDUCTION: This exam was performed according to the departmental dose-optimization program which includes automated exposure control, adjustment of the mA and/or kV according to patient size and/or use of iterative reconstruction technique. COMPARISON:  03/27/2024 FINDINGS: Lower chest: No acute pleural or parenchymal lung disease. Small hiatal hernia. Hepatobiliary: Unremarkable unenhanced appearance of the liver and gallbladder. No biliary duct dilation. Pancreas: Unremarkable unenhanced appearance. Spleen: Unremarkable unenhanced appearance. Adrenals/Urinary Tract: The adrenals are unremarkable. Prior right nephro ureterectomy with no abnormalities in the nephrectomy bed. There is an indwelling left ureteral stent extending from the left renal pelvis into the bladder lumen. No urinary tract calculi or obstructive uropathy. There is mild fat stranding surrounding the left renal pelvis and left ureter, which could be related to infection or patient's known history of neoplasm. The bladder is unremarkable. Stomach/Bowel: No bowel obstruction or ileus. Normal appendix right lower quadrant. Distal colonic diverticulosis without diverticulitis. No bowel wall thickening or inflammatory change. Small hiatal hernia. Vascular/Lymphatic: Stable aortic atherosclerosis. Assessment of the vascular lumen is limited without intravenous contrast. Multiple enlarged retroperitoneal lymph nodes are again identified, largest in the left para-aortic region measuring 11 mm in short axis, reference image 45/2, stable. No new adenopathy. Reproductive: Status post hysterectomy. No adnexal masses. Other: No free fluid or free intraperitoneal gas. No abdominal wall hernia. Musculoskeletal: No acute or destructive bony abnormalities. Reconstructed images demonstrate no additional findings. IMPRESSION: 1. Indwelling left ureteral stent as above, with mild fat stranding surrounding the left renal pelvis and left ureter consistent with known  history of neoplasm and/or infection. Please correlate with urinalysis.  2. Persistent left para-aortic lymphadenopathy, concerning for metastatic disease. 3. Distal colonic diverticulosis without diverticulitis. 4. Prior right nephro ureterectomy. 5.  Aortic Atherosclerosis (ICD10-I70.0). Electronically Signed   By: Ozell Daring M.D.   On: 07/29/2024 15:30      LOS: 2 days   Ole Bourdon, NP [facility] Pager: [phone]  07/31/2024, 9:13 AM

## 2024-07-31 NOTE — Plan of Care (Signed)

## 2024-07-31 NOTE — TOC Progression Note (Addendum)
 Transition of Care [facility]) - Progression Note    Patient Details  Name: [name] MRN: [id number] Date of Birth: 28-Oct-1945  Transition [facility] Care [facility]) CM/SW Contact  Esty Ahuja, Nathanel, RN Phone Number: 07/31/2024, 10:11 AM  Clinical Narrative: PT recc HHPT-no preference-await choice of HHC agency. Has own transport home.     Service Provider Request Status Services Address Phone Fax Patient Preferred  [facility] [place] [facility])  Accepted -- [address] [place] [address], [place] [address] [phone] --  [facility] [place])  Accepted -- [address] [place], [place] [address] [phone] --  [facility] [place] [facility])  Accepted -- [address], [place] [address] [phone]      Expected Discharge Plan: Home w Home Health Services Barriers to Discharge: Continued Medical Work up               Expected Discharge Plan and Services   Discharge Planning Services: CM Consult   Living arrangements for the past 2 months: Single Family Home                                       Social Drivers of Health (SDOH) Interventions SDOH Screenings   Food Insecurity: No Food Insecurity (07/29/2024)  Housing: Low Risk  (07/29/2024)  Transportation Needs: No Transportation Needs (07/29/2024)  Utilities: Not At Risk (07/29/2024)  Social Connections: Socially Integrated (07/29/2024)  Tobacco Use: Medium Risk (07/29/2024)    Readmission Risk Interventions    07/30/2024   11:30 AM 03/28/2024    1:20 PM 08/03/2023   10:47 AM  Readmission Risk Prevention Plan  Transportation Screening Complete Complete Complete  PCP or Specialist Appt within 3-5 Days Complete Complete Complete  HRI or Home Care Consult Complete Complete Complete  Social Work Consult for Recovery Care Planning/Counseling Complete Complete Complete  Palliative Care Screening  Complete Not Applicable Not Applicable  Medication Review Oceanographer) Complete Complete Complete

## 2024-07-31 NOTE — Progress Notes (Addendum)
 PROGRESS NOTE    [name]  FMW:[id number] DOB: [birth year] DOA: 07/29/2024 PCP: Chrystal Lamarr RAMAN, MD    Chief Complaint  Patient presents with   Abnormal Labs    Brief Narrative:  Patient a pleasant 78 year old female history of hyperlipidemia, history of PE, hypertension, CAD nonobstructive, OSA not on CPAP, bladder cancer status post right nephroureterectomy for cancer, anemia, GERD, CHF, history of PE 2022, CKD stage IV with progression being followed by nephrology with baseline creatinine of 3.3 presenting to the ED with a 1 week history of nausea vomiting diarrhea.  Patient seen by oncologist on day of admission labs obtained with a creatinine of 6.45 with a repeat in the ED of 5.77.  Patient noted to be following urology in the outpatient setting.  Urinalysis done concerning for UTI.  CT abdomen and pelvis assessed by urology and no indication for stent exchange.  Patient placed on IV antibiotics, IV fluids, urology following.   Assessment & Plan:   Principal Problem:   Acute kidney injury superimposed on stage 4 chronic kidney disease (HCC) Active Problems:   Essential hypertension   Mixed hyperlipidemia   Chronic combined systolic and diastolic CHF (congestive heart failure) (HCC)   Urothelial carcinoma (HCC)   Depression   CAD in native artery   HFrEF (heart failure with reduced ejection fraction) (HCC)   AKI (acute kidney injury)   Anemia due to chronic kidney disease  #1 AKI on CKD stage IV/metabolic acidosis -Baseline creatinine approximately 3.3 as of 03/2024. - Creatinine on admission as high as 6.45. - Likely secondary to a prerenal azotemia in the setting of nausea vomiting diarrhea/GI losses versus postop secondary to ureteral stent. - CT abdomen and pelvis with indwelling left ureteral stent, mild fat stranding surrounding the left renal pelvis and left ureter consistent with known history of neoplasm and no infection.  Persistent left periaortic  lymphadenopathy concerning for metastatic disease.  Distal colonic diverticulosis without diverticulitis.  Prior right nephro ureterectomy.  Aortic atherosclerosis. - Patient noted to be on IV fluids. - Urine sodium of 75, urine creatinine of 45. - Renal function slowly trending down. -Creatinine down to 4.97. -Acidosis improving with hydration. -Continue IV fluids at 100 cc an hour for the next 24 hours. - Monitor urine output. -Patient assessed by urology and recommending that ureteral stent can be exchanged sometime in the near future and ideally would not be instrumented on with active infection however may reconsider should patient decompensate. -Urology following. - Follow.  2.  UTI -Preliminary urine cultures with > 100,000 colonies of Enterococcus faecalis sensitive to ampicillin, nitrofurantoin, vancomycin .   - Discontinue IV Rocephin .   - Start IV ampicillin and could potentially transition to amoxicillin in the next 24 to 48 hours if continued improvement to complete a 7-day course of antibiotic treatment.  - Supportive care.  3.  Normocytic anemia - Patient with no active bleeding. - Hemoglobin 9.1 this morning.  - Follow H&H.  4.  History of bladder cancer status post right nephroureterectomy - Outpatient follow-up with primary oncologist.  5.  Hypertension - Imdur , carvedilol .    6.  CAD, nonobstructive - Aspirin , statin, Imdur , carvedilol .   7.  OSA -As needed O2 at bedtime.  8.  HFpEF -Stable. - Noted to have been on diuretics prior to admission which are currently on hold due to presentation with AKI on CKD stage IV. - Monitor closely for volume overload with hydration.  9.  GERD - Pepcid .    10.  Hyperlipidemia - Crestor .    DVT prophylaxis: Heparin  Code Status: Full Family Communication: Updated patient.  No family at bedside. Disposition: Home when clinically improved and renal function back to baseline.  Status is: Inpatient Remains inpatient  appropriate because: Severity of illness   Consultants:  Urology: Dr. Alvaro 07/29/2024  Procedures:  CT abdomen and pelvis 07/29/2024   Antimicrobials:  Anti-infectives (From admission, onward)    Start     Dose/Rate Route Frequency Ordered Stop   07/31/24 1000  ampicillin (OMNIPEN) 2 g in sodium chloride  0.9 % 100 mL IVPB        2 g 300 mL/hr over 20 Minutes Intravenous Every 24 hours 07/31/24 0838     07/30/24 1400  cefTRIAXone  (ROCEPHIN ) 1 g in sodium chloride  0.9 % 100 mL IVPB  Status:  Discontinued        1 g 200 mL/hr over 30 Minutes Intravenous Every 24 hours 07/29/24 1452 07/30/24 0829   07/30/24 1400  cefTRIAXone  (ROCEPHIN ) 2 g in sodium chloride  0.9 % 100 mL IVPB  Status:  Discontinued        2 g 200 mL/hr over 30 Minutes Intravenous Every 24 hours 07/30/24 0829 07/31/24 0837   07/29/24 1400  cefTRIAXone  (ROCEPHIN ) 1 g in sodium chloride  0.9 % 100 mL IVPB        1 g 200 mL/hr over 30 Minutes Intravenous  Once 07/29/24 1359 07/29/24 1458         Subjective: Sitting up in bed looking at her menu.  Overall feels much better than she did on admission.  Denies any chest pain or shortness of breath.  Denies any abdominal pain.  Denies any further diarrhea.  Tolerating current diet.   Objective: Vitals:   07/30/24 1254 07/30/24 1941 07/31/24 0435 07/31/24 0438  BP: (!) 147/58 (!) 148/67 (!) 150/62   Pulse: 62 64 64   Resp: 16 18 15    Temp: (!) 97.5 F (36.4 C) 98.3 F (36.8 C) 98.2 F (36.8 C)   TempSrc: Oral Oral Oral   SpO2: 98% 98% 95%   Weight:    79.3 kg  Height:        Intake/Output Summary (Last 24 hours) at 07/31/2024 1223 Last data filed at 07/31/2024 1125 Gross per 24 hour  Intake 1972.15 ml  Output 2925 ml  Net -952.85 ml   Filed Weights   07/29/24 1807 07/30/24 0500 07/31/24 0438  Weight: 82.6 kg 82.3 kg 79.3 kg    Examination:  General exam: NAD Respiratory system: CTAB.  No wheezes, no crackles, no rhonchi.  Fair air movement.  Speaking  in full sentences.   Cardiovascular system: RRR no murmurs rubs or gallops.  No JVD.  No pitting lower extremity edema.  Gastrointestinal system: Abdomen is soft, nontender, nondistended, positive bowel sounds.  No rebound.  No guarding.  Central nervous system: Alert and oriented. No focal neurological deficits. Extremities: Symmetric 5 x 5 power. Skin: No rashes, lesions or ulcers Psychiatry: Judgement and insight appear normal. Mood & affect appropriate.     Data Reviewed: I have personally reviewed following labs and imaging studies  CBC: Recent Labs  Lab 07/29/24 0947 07/30/24 0519 07/31/24 0828  WBC 9.3 7.8 7.5  NEUTROABS 6.0  --   --   HGB 9.9* 8.8* 9.1*  HCT 30.9* 28.9* 30.0*  MCV 92.8 97.3 97.7  PLT 254 234 232    Basic Metabolic Panel: Recent Labs  Lab 07/29/24 0947 07/29/24 1335 07/30/24 0519 07/31/24 9171  NA 137 136 141 140  K 4.5 4.9 5.1 4.6  CL 106 106 110 110  CO2 21* 16* 18* 19*  GLUCOSE 140* 86 84 130*  BUN 84* 79* 79* 66*  CREATININE 6.45* 5.77* 5.66* 4.97*  CALCIUM  9.7 8.8* 8.9 8.6*    GFR: Estimated Creatinine Clearance: 9.6 mL/min (A) (by C-G formula based on SCr of 4.97 mg/dL (H)).  Liver Function Tests: Recent Labs  Lab 07/29/24 0947 07/30/24 0519  AST 17 17  ALT 20 20  ALKPHOS 61 67  BILITOT 0.2 0.2  PROT 8.1 7.1  ALBUMIN  3.5 3.3*    CBG: No results for input(s): GLUCAP in the last 168 hours.   Recent Results (from the past 240 hours)  Urine Culture     Status: Abnormal   Collection Time: 07/29/24 12:48 PM   Specimen: Urine, Random  Result Value Ref Range Status   Specimen Description   Final    URINE, RANDOM Performed at [facility], [address] [place], [place] [address]    Special Requests   Final    NONE Reflexed from [id number] Performed at [facility], [address] [place], [place] [address]    Culture >=100,000 COLONIES/mL ENTEROCOCCUS FAECALIS (A)  Final   Report  Status 07/31/2024 FINAL  Final   Organism ID, Bacteria ENTEROCOCCUS FAECALIS (A)  Final      Susceptibility   Enterococcus faecalis - MIC*    AMPICILLIN <=2 SENSITIVE Sensitive     NITROFURANTOIN <=16 SENSITIVE Sensitive     VANCOMYCIN  1 SENSITIVE Sensitive     * >=100,000 COLONIES/mL ENTEROCOCCUS FAECALIS         Radiology Studies: CT ABDOMEN PELVIS WO CONTRAST Result Date: 07/29/2024 CLINICAL DATA:  Nausea and vomiting for 1 week, fever, prior right nephrectomy, indwelling left ureteral stent, history of left ureteral cancer EXAM: CT ABDOMEN AND PELVIS WITHOUT CONTRAST TECHNIQUE: Multidetector CT imaging of the abdomen and pelvis was performed following the standard protocol without IV contrast. RADIATION DOSE REDUCTION: This exam was performed according to the departmental dose-optimization program which includes automated exposure control, adjustment of the mA and/or kV according to patient size and/or use of iterative reconstruction technique. COMPARISON:  03/27/2024 FINDINGS: Lower chest: No acute pleural or parenchymal lung disease. Small hiatal hernia. Hepatobiliary: Unremarkable unenhanced appearance of the liver and gallbladder. No biliary duct dilation. Pancreas: Unremarkable unenhanced appearance. Spleen: Unremarkable unenhanced appearance. Adrenals/Urinary Tract: The adrenals are unremarkable. Prior right nephro ureterectomy with no abnormalities in the nephrectomy bed. There is an indwelling left ureteral stent extending from the left renal pelvis into the bladder lumen. No urinary tract calculi or obstructive uropathy. There is mild fat stranding surrounding the left renal pelvis and left ureter, which could be related to infection or patient's known history of neoplasm. The bladder is unremarkable. Stomach/Bowel: No bowel obstruction or ileus. Normal appendix right lower quadrant. Distal colonic diverticulosis without diverticulitis. No bowel wall thickening or inflammatory change.  Small hiatal hernia. Vascular/Lymphatic: Stable aortic atherosclerosis. Assessment of the vascular lumen is limited without intravenous contrast. Multiple enlarged retroperitoneal lymph nodes are again identified, largest in the left para-aortic region measuring 11 mm in short axis, reference image 45/2, stable. No new adenopathy. Reproductive: Status post hysterectomy. No adnexal masses. Other: No free fluid or free intraperitoneal gas. No abdominal wall hernia. Musculoskeletal: No acute or destructive bony abnormalities. Reconstructed images demonstrate no additional findings. IMPRESSION: 1. Indwelling left ureteral stent as above, with mild fat stranding surrounding the  left renal pelvis and left ureter consistent with known history of neoplasm and/or infection. Please correlate with urinalysis. 2. Persistent left para-aortic lymphadenopathy, concerning for metastatic disease. 3. Distal colonic diverticulosis without diverticulitis. 4. Prior right nephro ureterectomy. 5.  Aortic Atherosclerosis (ICD10-I70.0). Electronically Signed   By: Ozell Daring M.D.   On: 07/29/2024 15:30        Scheduled Meds:  aspirin  EC  81 mg Oral Daily   carvedilol   6.25 mg Oral BID WC   cholecalciferol   1,000 Units Oral q morning   famotidine   10 mg Oral Daily   heparin   5,000 Units Subcutaneous Q8H   icosapent  Ethyl  1 g Oral BID   iron polysaccharides  150 mg Oral Daily   isosorbide  mononitrate  15 mg Oral Daily   rosuvastatin   10 mg Oral Daily   sertraline   50 mg Oral q morning   traZODone   150 mg Oral QHS   Continuous Infusions:  sodium chloride  100 mL/hr at 07/31/24 1128   ampicillin (OMNIPEN) IV 2 g (07/31/24 1126)     LOS: 2 days    Time spent: 40 minutes    Toribio Hummer, MD [facility]   To contact the attending provider between 7A-7P or the covering provider during after hours 7P-7A, please log into the web site [email or web address] and access using universal [place] password for  that web site. If you do not have the password, please call the hospital operator.  07/31/2024, 12:23 PM

## 2024-08-01 ENCOUNTER — Telehealth: Payer: Self-pay

## 2024-08-01 ENCOUNTER — Telehealth: Payer: Self-pay | Admitting: Internal Medicine

## 2024-08-01 ENCOUNTER — Other Ambulatory Visit: Payer: Self-pay | Admitting: Urology

## 2024-08-01 DIAGNOSIS — C689 Malignant neoplasm of urinary organ, unspecified: Secondary | ICD-10-CM | POA: Diagnosis not present

## 2024-08-01 DIAGNOSIS — N179 Acute kidney failure, unspecified: Secondary | ICD-10-CM | POA: Diagnosis not present

## 2024-08-01 DIAGNOSIS — E782 Mixed hyperlipidemia: Secondary | ICD-10-CM | POA: Diagnosis not present

## 2024-08-01 DIAGNOSIS — I10 Essential (primary) hypertension: Secondary | ICD-10-CM | POA: Diagnosis not present

## 2024-08-01 LAB — CBC
HCT: 28.5 % — ABNORMAL LOW (ref 36.0–46.0)
Hemoglobin: 8.3 g/dL — ABNORMAL LOW (ref 12.0–15.0)
MCH: 28.9 pg (ref 26.0–34.0)
MCHC: 29.1 g/dL — ABNORMAL LOW (ref 30.0–36.0)
MCV: 99.3 fL (ref 80.0–100.0)
Platelets: 217 K/uL (ref 150–400)
RBC: 2.87 MIL/uL — ABNORMAL LOW (ref 3.87–5.11)
RDW: 13.9 % (ref 11.5–15.5)
WBC: 6.7 K/uL (ref 4.0–10.5)
nRBC: 0 % (ref 0.0–0.2)

## 2024-08-01 LAB — RENAL FUNCTION PANEL
Albumin: 3 g/dL — ABNORMAL LOW (ref 3.5–5.0)
Anion gap: 11 (ref 5–15)
BUN: 59 mg/dL — ABNORMAL HIGH (ref 8–23)
CO2: 18 mmol/L — ABNORMAL LOW (ref 22–32)
Calcium: 8.4 mg/dL — ABNORMAL LOW (ref 8.9–10.3)
Chloride: 114 mmol/L — ABNORMAL HIGH (ref 98–111)
Creatinine, Ser: 4.68 mg/dL — ABNORMAL HIGH (ref 0.44–1.00)
GFR, Estimated: 9 mL/min — ABNORMAL LOW (ref >60)
Glucose, Bld: 83 mg/dL (ref 70–99)
Phosphorus: 5.6 mg/dL — ABNORMAL HIGH (ref 2.5–4.6)
Potassium: 5.6 mmol/L — ABNORMAL HIGH (ref 3.5–5.1)
Sodium: 143 mmol/L (ref 135–145)

## 2024-08-01 MED ORDER — SODIUM ZIRCONIUM CYCLOSILICATE 10 G PO PACK
10.0000 g | PACK | Freq: Two times a day (BID) | ORAL | Status: AC
Start: 2024-08-01 — End: 2024-08-02
  Administered 2024-08-01 – 2024-08-02 (×3): 10 g via ORAL
  Filled 2024-08-01 (×3): qty 1

## 2024-08-01 MED ORDER — SODIUM CHLORIDE 0.9 % IV SOLN: INTRAVENOUS | Status: AC

## 2024-08-01 NOTE — Telephone Encounter (Signed)
   Name: [name]  DOB: 1945/10/24  MRN: [id number]  Primary Cardiologist: Stanly DELENA Leavens, MD   Preoperative team, please contact this patient and set up a phone call appointment for further preoperative risk assessment. Please obtain consent and complete medication review. Thank you for your help.  I confirm that guidance regarding antiplatelet and oral anticoagulation therapy has been completed and, if necessary, noted below.  Per office protocol, if patient is without any new symptoms or concerns at the time of their virtual visit, she may hold ASA for 7 days prior to procedure. Please resume ASA as soon as possible postprocedure, at the discretion of the surgeon.    I also confirmed the patient resides in the state of [place] . As per [facility] Medical Board telemedicine laws, the patient must reside in the state in which the provider is licensed.   Lamarr Satterfield, NP 08/01/2024, 11:25 AM [facility]

## 2024-08-01 NOTE — TOC Progression Note (Signed)
 Transition of Care [facility]) - Progression Note    Patient Details  Name: [name] MRN: [id number] Date of Birth: Aug 03, 1946  Transition of [facility]) CM/SW Contact  Mikaiya Tramble, Nathanel, RN Phone Number: 08/01/2024, 10:33 AM  Clinical Narrative: Patient chose [facility] rep [facility] accepted, & following for HHPT/OT. Has own transport home.      Expected Discharge Plan: Home w Home Health Services Barriers to Discharge: Continued Medical Work up               Expected Discharge Plan and Services   Discharge Planning Services: CM Consult Post Acute Care Choice: Home Health Living arrangements for the past 2 months: Single Family Home                           HH Arranged: PT, OT HH Agency: [facility] Date [facility] Agency Contacted: 08/01/24 Time HH Agency Contacted: 1032 Representative spoke with at [facility] Agency: [facility]   Social Drivers of Health (SDOH) Interventions SDOH Screenings   Food Insecurity: No Food Insecurity (07/29/2024)  Housing: Low Risk  (07/29/2024)  Transportation Needs: No Transportation Needs (07/29/2024)  Utilities: Not At Risk (07/29/2024)  Social Connections: Socially Integrated (07/29/2024)  Tobacco Use: Medium Risk (07/29/2024)    Readmission Risk Interventions    07/30/2024   11:30 AM 03/28/2024    1:20 PM 08/03/2023   10:47 AM  Readmission Risk Prevention Plan  Transportation Screening Complete Complete Complete  PCP or Specialist Appt within 3-5 Days Complete Complete Complete  HRI or Home Care Consult Complete Complete Complete  Social Work Consult for Recovery Care Planning/Counseling Complete Complete Complete  Palliative Care Screening Complete Not Applicable Not Applicable  Medication Review [employer]) Complete Complete Complete

## 2024-08-01 NOTE — Telephone Encounter (Signed)
 Called patient to set up an televisit appointment for a pre-op clearance on 08/08/24 @ 9:20. Meds , Rec, and consent done.

## 2024-08-01 NOTE — Progress Notes (Signed)
 PROGRESS NOTE    [name]  FMW:[id number] DOB: Sep 01, 1946 DOA: 07/29/2024 PCP: Chrystal Lamarr RAMAN, MD    Chief Complaint  Patient presents with   Abnormal Labs    Brief Narrative:  Patient a pleasant 78 year old female history of hyperlipidemia, history of PE, hypertension, CAD nonobstructive, OSA not on CPAP, bladder cancer status post right nephroureterectomy for cancer, anemia, GERD, CHF, history of PE 2022, CKD stage IV with progression being followed by nephrology with baseline creatinine of 3.3 presenting to the ED with a 1 week history of nausea vomiting diarrhea.  Patient seen by oncologist on day of admission labs obtained with a creatinine of 6.45 with a repeat in the ED of 5.77.  Patient noted to be following urology in the outpatient setting.  Urinalysis done concerning for UTI.  CT abdomen and pelvis assessed by urology and no indication for stent exchange.  Patient placed on IV antibiotics, IV fluids, urology following.   Assessment & Plan:   Principal Problem:   Acute kidney injury superimposed on stage 4 chronic kidney disease (HCC) Active Problems:   Essential hypertension   Mixed hyperlipidemia   Chronic combined systolic and diastolic CHF (congestive heart failure) (HCC)   Urothelial carcinoma (HCC)   Depression   CAD in native artery   HFrEF (heart failure with reduced ejection fraction) (HCC)   AKI (acute kidney injury)   Anemia due to chronic kidney disease  #1 AKI on CKD stage IV/metabolic acidosis -Baseline creatinine approximately 3.3 as of 03/2024. - Creatinine on admission as high as 6.45. - Likely secondary to a prerenal azotemia in the setting of nausea vomiting diarrhea/GI losses versus postop secondary to ureteral stent. - CT abdomen and pelvis with indwelling left ureteral stent, mild fat stranding surrounding the left renal pelvis and left ureter consistent with known history of neoplasm and no infection.  Persistent left periaortic  lymphadenopathy concerning for metastatic disease.  Distal colonic diverticulosis without diverticulitis.  Prior right nephro ureterectomy.  Aortic atherosclerosis. - Patient noted to be on IV fluids. - Urine sodium of 75, urine creatinine of 45. - Renal function slowly trending down. -Creatinine down to 4.68. -Acidosis improving with hydration. -Patient with urine output of 2.250 L over the past 24 hours. -Continue IV fluids at 100 cc an hour for the next 24 hours. - Monitor urine output. -Patient assessed by urology and recommending that ureteral stent can be exchanged sometime in the near future and ideally would not be instrumented on with active infection however may reconsider should patient decompensate. -Urology following. - Follow.  2.  Enterococcus faecalis UTI -Urine cultures with > 100,000 colonies of Enterococcus faecalis sensitive to ampicillin, nitrofurantoin, vancomycin .   - Was on IV Rocephin  and has been transition to IV ampicillin for -could likely transition to amoxicillin 500 mg twice daily in the next 1 to 2 days to complete a 7 to 10-day course of antibiotic treatment.  -Supportive care.    3.  Normocytic anemia - Patient with no active bleeding. - Hemoglobin currently at 8.3.   - Follow H&H.   4.  History of bladder cancer status post right nephroureterectomy - Outpatient follow-up with primary oncologist.  5.  Hypertension - Continue Imdur , carvedilol .    6.  CAD, nonobstructive - Continue aspirin , statin, Imdur , carvedilol .   7.  OSA -As needed O2 at bedtime.  8.  HFpEF -Stable. - Noted to have been on diuretics prior to admission which are currently on hold due to presentation with  AKI on CKD stage IV. - Monitor closely for volume overload with hydration.  9.  GERD - Continue Pepcid .    10.  Hyperlipidemia - Continue Crestor .  11.  Hyperkalemia -Likely secondary to AKI on CKD. - Lokelma  10 mg twice daily x 4 doses. - Repeat labs in the  AM.   DVT prophylaxis: Heparin  Code Status: Full Family Communication: Updated patient.  No family at bedside. Disposition: Home when clinically improved and renal function back to baseline.  Status is: Inpatient Remains inpatient appropriate because: Severity of illness   Consultants:  Urology: Dr. Alvaro 07/29/2024  Procedures:  CT abdomen and pelvis 07/29/2024   Antimicrobials:  Anti-infectives (From admission, onward)    Start     Dose/Rate Route Frequency Ordered Stop   07/31/24 1000  ampicillin (OMNIPEN) 2 g in sodium chloride  0.9 % 100 mL IVPB        2 g 300 mL/hr over 20 Minutes Intravenous Every 24 hours 07/31/24 0838     07/30/24 1400  cefTRIAXone  (ROCEPHIN ) 1 g in sodium chloride  0.9 % 100 mL IVPB  Status:  Discontinued        1 g 200 mL/hr over 30 Minutes Intravenous Every 24 hours 07/29/24 1452 07/30/24 0829   07/30/24 1400  cefTRIAXone  (ROCEPHIN ) 2 g in sodium chloride  0.9 % 100 mL IVPB  Status:  Discontinued        2 g 200 mL/hr over 30 Minutes Intravenous Every 24 hours 07/30/24 0829 07/31/24 0837   07/29/24 1400  cefTRIAXone  (ROCEPHIN ) 1 g in sodium chloride  0.9 % 100 mL IVPB        1 g 200 mL/hr over 30 Minutes Intravenous  Once 07/29/24 1359 07/29/24 1458         Subjective: Patient sitting up in recliner.  States she feels very well today.  Denies any chest pain or shortness of breath.  No abdominal pain.  States having good urine output.  Tolerating current diet.   Objective: Vitals:   07/31/24 1935 08/01/24 0442 08/01/24 0451 08/01/24 1025  BP: (!) 156/63 (!) 151/72  (!) 151/54  Pulse: (!) 57 (!) 50  66  Resp: 15 18    Temp: 98.1 F (36.7 C) 98.6 F (37 C)    TempSrc: Oral     SpO2: 98% 97%    Weight:   79 kg   Height:        Intake/Output Summary (Last 24 hours) at 08/01/2024 1058 Last data filed at 08/01/2024 0814 Gross per 24 hour  Intake 962.95 ml  Output 2300 ml  Net -1337.05 ml   Filed Weights   07/30/24 0500 07/31/24 0438  08/01/24 0451  Weight: 82.3 kg 79.3 kg 79 kg    Examination:  General exam: NAD Respiratory system: Lungs clear to auscultation bilaterally.  No wheezes, no crackles, no rhonchi.  Fair air movement.  Speaking in full sentences.  Cardiovascular system: Regular rate rhythm no murmurs rubs or gallops.  No JVD.  No lower extremity edema.   Gastrointestinal system: Abdomen is soft, nontender, nondistended, positive bowel sounds.  No rebound.  No guarding.  Central nervous system: Alert and oriented. No focal neurological deficits. Extremities: Symmetric 5 x 5 power. Skin: No rashes, lesions or ulcers Psychiatry: Judgement and insight appear normal. Mood & affect appropriate.     Data Reviewed: I have personally reviewed following labs and imaging studies  CBC: Recent Labs  Lab 07/29/24 0947 07/30/24 0519 07/31/24 0828 08/01/24 0530  WBC 9.3 7.8  7.5 6.7  NEUTROABS 6.0  --   --   --   HGB 9.9* 8.8* 9.1* 8.3*  HCT 30.9* 28.9* 30.0* 28.5*  MCV 92.8 97.3 97.7 99.3  PLT 254 234 232 217    Basic Metabolic Panel: Recent Labs  Lab 07/29/24 0947 07/29/24 1335 07/30/24 0519 07/31/24 0828 08/01/24 0530  NA 137 136 141 140 143  K 4.5 4.9 5.1 4.6 5.6*  CL 106 106 110 110 114*  CO2 21* 16* 18* 19* 18*  GLUCOSE 140* 86 84 130* 83  BUN 84* 79* 79* 66* 59*  CREATININE 6.45* 5.77* 5.66* 4.97* 4.68*  CALCIUM  9.7 8.8* 8.9 8.6* 8.4*  PHOS  --   --   --   --  5.6*    GFR: Estimated Creatinine Clearance: 10.2 mL/min (A) (by C-G formula based on SCr of 4.68 mg/dL (H)).  Liver Function Tests: Recent Labs  Lab 07/29/24 0947 07/30/24 0519 08/01/24 0530  AST 17 17  --   ALT 20 20  --   ALKPHOS 61 67  --   BILITOT 0.2 0.2  --   PROT 8.1 7.1  --   ALBUMIN  3.5 3.3* 3.0*    CBG: No results for input(s): GLUCAP in the last 168 hours.   Recent Results (from the past 240 hours)  Urine Culture     Status: Abnormal   Collection Time: 07/29/24 12:48 PM   Specimen: Urine, Random   Result Value Ref Range Status   Specimen Description   Final    URINE, RANDOM Performed at [facility], [address], [place], [place] [address]    Special Requests   Final    NONE Reflexed from [id number] Performed at [facility], [address], [place], [place] [address]    Culture >=100,000 COLONIES/mL ENTEROCOCCUS FAECALIS (A)  Final   Report Status 07/31/2024 FINAL  Final   Organism ID, Bacteria ENTEROCOCCUS FAECALIS (A)  Final      Susceptibility   Enterococcus faecalis - MIC*    AMPICILLIN <=2 SENSITIVE Sensitive     NITROFURANTOIN <=16 SENSITIVE Sensitive     VANCOMYCIN  1 SENSITIVE Sensitive     * >=100,000 COLONIES/mL ENTEROCOCCUS FAECALIS         Radiology Studies: No results found.       Scheduled Meds:  aspirin  EC  81 mg Oral Daily   carvedilol   6.25 mg Oral BID WC   cholecalciferol   1,000 Units Oral q morning   famotidine   10 mg Oral Daily   heparin   5,000 Units Subcutaneous Q8H   icosapent  Ethyl  1 g Oral BID   iron polysaccharides  150 mg Oral Daily   isosorbide  mononitrate  15 mg Oral Daily   rosuvastatin   10 mg Oral Daily   sertraline   50 mg Oral q morning   sodium zirconium cyclosilicate   10 g Oral BID   traZODone   150 mg Oral QHS   Continuous Infusions:  sodium chloride  75 mL/hr at 08/01/24 1036   ampicillin (OMNIPEN) IV 2 g (08/01/24 1034)     LOS: 3 days    Time spent: 40 minutes    Toribio Hummer, MD [facility]   To contact the attending provider between 7A-7P or the covering provider during after hours 7P-7A, please log into the web site [email or web address] and access using universal [place] password for that web site. If you do not have the password, please call the hospital operator.  08/01/2024, 10:58 AM

## 2024-08-01 NOTE — Progress Notes (Signed)
 Physical Therapy Treatment Patient Details Name: [name] MRN: [id number] DOB: 23-Aug-1946 Today's Date: 08/01/2024   History of Present Illness 78 yo female admitted with AKI on CKD. Hx of PE, CAD, OSA, bladder Ca s/p R nephroureterectomy, CHF, CKD    PT Comments  PT - Cognition Comments: AxO x 3 pleasant and motivated.  Prior home with Spouse.  IND, NO AD and driving Noted K+ 5.6 and HgB 8.3 Closely monitoring activity level and tolerance. Pt was OOB in recliner.  Assisted with amb in hallway went well.  General Gait Details: Tolerated a functional distance NO AD, good alternating gait with no overt LOB.  AVG HR 67.  Feeling better. General transfer comment: self able with good safety cognition and use of hands to steady self.  NO AD.   Pt plans to return home.  LPT has rec HH PT.     If plan is discharge home, recommend the following: A little help with walking and/or transfers;A little help with bathing/dressing/bathroom;Assistance with cooking/housework;Assist for transportation;Help with stairs or ramp for entrance   Can travel by private vehicle        Equipment Recommendations  None recommended by PT    Recommendations for Other Services       Precautions / Restrictions Precautions Precautions: Fall Restrictions Weight Bearing Restrictions Per Provider Order: No     Mobility  Bed Mobility               General bed mobility comments: OOB in recliner    Transfers Overall transfer level: Needs assistance Equipment used: None Transfers: Sit to/from Stand Sit to Stand: Modified independent (Device/Increase time)           General transfer comment: self able with good safety cognition and use of hands to steady self.  NO AD.    Ambulation/Gait Ambulation/Gait assistance: Supervision Gait Distance (Feet): 175 Feet Assistive device: None Gait Pattern/deviations: Step-through pattern Gait velocity: WNL     General Gait Details: Tolerated a  functional distance NO AD, good alternating gait with no overt LOB.  AVG HR 67.  Feeling better.   Stairs             Wheelchair Mobility     Tilt Bed    Modified Rankin (Stroke Patients Only)       Balance                                            Communication    Cognition Arousal: Alert Behavior During Therapy: WFL for tasks assessed/performed   PT - Cognitive impairments: No apparent impairments                       PT - Cognition Comments: AxO x 3 pleasant and motivated.  Prior home with Spouse.  IND, NO AD and driving Following commands: Intact      Cueing    Exercises      General Comments        Pertinent Vitals/Pain Pain Assessment Pain Assessment: No/denies pain    Home Living                          Prior Function            PT Goals (current goals can now be found in the care plan section)  Progress towards PT goals: Progressing toward goals    Frequency    Min 3X/week      PT Plan      Co-evaluation              AM-PAC PT 6 Clicks Mobility   Outcome Measure  Help needed turning from your back to your side while in a flat bed without using bedrails?: None Help needed moving from lying on your back to sitting on the side of a flat bed without using bedrails?: None Help needed moving to and from a bed to a chair (including a wheelchair)?: None Help needed standing up from a chair using your arms (e.g., wheelchair or bedside chair)?: None Help needed to walk in hospital room?: None Help needed climbing 3-5 steps with a railing? : A Little 6 Click Score: 23    End of Session Equipment Utilized During Treatment: Gait belt Activity Tolerance: Patient tolerated treatment well Patient left: in chair;with call bell/phone within reach Nurse Communication: Mobility status PT Visit Diagnosis: Muscle weakness (generalized) (M62.81);Unsteadiness on feet (R26.81);Difficulty in walking,  not elsewhere classified (R26.2)     Time: 8947-8893 PT Time Calculation (min) (ACUTE ONLY): 14 min  Charges:    $Gait Training: 8-22 mins PT General Charges $$ ACUTE PT VISIT: 1 Visit                     Katheryn Leap  PTA Acute  Rehabilitation Services Office M-F          [phone]

## 2024-08-01 NOTE — Telephone Encounter (Signed)
 Called patient to set up an televisit appointment for a pre-op clearance on 08/08/24 @ 9:20. Meds , Rec, and consent done.         Patient Consent for Virtual Visit        [name] has provided verbal consent on 08/01/2024 for a virtual visit (video or telephone).   CONSENT FOR VIRTUAL VISIT FOR:  [name]  By participating in this virtual visit I agree to the following:  I hereby voluntarily request, consent and authorize [facility] and its employed or contracted physicians, physician assistants, nurse practitioners or other licensed health care professionals (the Practitioner), to provide me with telemedicine health care services (the "Services) as deemed necessary by the treating Practitioner. I acknowledge and consent to receive the Services by the Practitioner via telemedicine. I understand that the telemedicine visit will involve communicating with the Practitioner through live audiovisual communication technology and the disclosure of certain medical information by electronic transmission. I acknowledge that I have been given the opportunity to request an in-person assessment or other available alternative prior to the telemedicine visit and am voluntarily participating in the telemedicine visit.  I understand that I have the right to withhold or withdraw my consent to the use of telemedicine in the course of my care at any time, without affecting my right to future care or treatment, and that the Practitioner or I may terminate the telemedicine visit at any time. I understand that I have the right to inspect all information obtained and/or recorded in the course of the telemedicine visit and may receive copies of available information for a reasonable fee.  I understand that some of the potential risks of receiving the Services via telemedicine include:  Delay or interruption in medical evaluation due to technological equipment failure or  disruption; Information transmitted may not be sufficient (e.g. poor resolution of images) to allow for appropriate medical decision making by the Practitioner; and/or  In rare instances, security protocols could fail, causing a breach of personal health information.  Furthermore, I acknowledge that it is my responsibility to provide information about my medical history, conditions and care that is complete and accurate to the best of my ability. I acknowledge that Practitioner's advice, recommendations, and/or decision may be based on factors not within their control, such as incomplete or inaccurate data provided by me or distortions of diagnostic images or specimens that may result from electronic transmissions. I understand that the practice of medicine is not an exact science and that Practitioner makes no warranties or guarantees regarding treatment outcomes. I acknowledge that a copy of this consent can be made available to me via my patient portal [facility] MyChart), or I can request a printed copy by calling the office of [facility].    I understand that my insurance will be billed for this visit.   I have read or had this consent read to me. I understand the contents of this consent, which adequately explains the benefits and risks of the Services being provided via telemedicine.  I have been provided ample opportunity to ask questions regarding this consent and the Services and have had my questions answered to my satisfaction. I give my informed consent for the services to be provided through the use of telemedicine in my medical care

## 2024-08-01 NOTE — Telephone Encounter (Signed)
   Pre-operative Risk Assessment    Patient Name: KATEENA DEGROOTE  DOB: 22-Sep-1946 MRN: [id number]   Date of last office visit: 02/04/24 Date of next office visit: none   Request for Surgical Clearance    Procedure:  Cystoscopy, left ureteroscopy, possible tumor ablation  Date of Surgery:  Clearance 04/02/24                                Surgeon:  Dr. IVAR Likens Surgeon's Group or Practice Name:  [facility] Phone number:  [phone] Fax number:  [phone]   Type of Clearance Requested:   - Medical  - Pharmacy:  Hold Aspirin  deferring to cardiologist    Type of Anesthesia:  General    Additional requests/questions:     Bonney Jonette Burrow   08/01/2024, 11:13 AM

## 2024-08-02 DIAGNOSIS — N179 Acute kidney failure, unspecified: Secondary | ICD-10-CM | POA: Diagnosis not present

## 2024-08-02 DIAGNOSIS — I10 Essential (primary) hypertension: Secondary | ICD-10-CM | POA: Diagnosis not present

## 2024-08-02 DIAGNOSIS — C689 Malignant neoplasm of urinary organ, unspecified: Secondary | ICD-10-CM | POA: Diagnosis not present

## 2024-08-02 DIAGNOSIS — E782 Mixed hyperlipidemia: Secondary | ICD-10-CM | POA: Diagnosis not present

## 2024-08-02 LAB — CBC
HCT: 27.9 % — ABNORMAL LOW (ref 36.0–46.0)
Hemoglobin: 8.2 g/dL — ABNORMAL LOW (ref 12.0–15.0)
MCH: 29.2 pg (ref 26.0–34.0)
MCHC: 29.4 g/dL — ABNORMAL LOW (ref 30.0–36.0)
MCV: 99.3 fL (ref 80.0–100.0)
Platelets: 197 K/uL (ref 150–400)
RBC: 2.81 MIL/uL — ABNORMAL LOW (ref 3.87–5.11)
RDW: 14 % (ref 11.5–15.5)
WBC: 6.9 K/uL (ref 4.0–10.5)
nRBC: 0 % (ref 0.0–0.2)

## 2024-08-02 LAB — RENAL FUNCTION PANEL
Albumin: 3 g/dL — ABNORMAL LOW (ref 3.5–5.0)
Anion gap: 10 (ref 5–15)
BUN: 50 mg/dL — ABNORMAL HIGH (ref 8–23)
CO2: 18 mmol/L — ABNORMAL LOW (ref 22–32)
Calcium: 8.4 mg/dL — ABNORMAL LOW (ref 8.9–10.3)
Chloride: 115 mmol/L — ABNORMAL HIGH (ref 98–111)
Creatinine, Ser: 4.64 mg/dL — ABNORMAL HIGH (ref 0.44–1.00)
GFR, Estimated: 9 mL/min — ABNORMAL LOW (ref >60)
Glucose, Bld: 85 mg/dL (ref 70–99)
Phosphorus: 5.4 mg/dL — ABNORMAL HIGH (ref 2.5–4.6)
Potassium: 5 mmol/L (ref 3.5–5.1)
Sodium: 142 mmol/L (ref 135–145)

## 2024-08-02 MED ORDER — SODIUM CHLORIDE 0.9 % IV SOLN: INTRAVENOUS | Status: DC

## 2024-08-02 NOTE — Progress Notes (Signed)
 PROGRESS NOTE    [name]  FMW:[id number] DOB: 1945-12-02 DOA: 07/29/2024 PCP: Chrystal Lamarr RAMAN, MD    Chief Complaint  Patient presents with   Abnormal Labs    Brief Narrative:  Patient a pleasant 78 year old female history of hyperlipidemia, history of PE, hypertension, CAD nonobstructive, OSA not on CPAP, bladder cancer status post right nephroureterectomy for cancer, anemia, GERD, CHF, history of PE 2022, CKD stage IV with progression being followed by nephrology with baseline creatinine of 3.3 presenting to the ED with a 1 week history of nausea vomiting diarrhea.  Patient seen by oncologist on day of admission labs obtained with a creatinine of 6.45 with a repeat in the ED of 5.77.  Patient noted to be following urology in the outpatient setting.  Urinalysis done concerning for UTI.  CT abdomen and pelvis assessed by urology and no indication for stent exchange.  Patient placed on IV antibiotics, IV fluids, urology following.   Assessment & Plan:   Principal Problem:   Acute kidney injury superimposed on stage 4 chronic kidney disease (HCC) Active Problems:   Essential hypertension   Mixed hyperlipidemia   Chronic combined systolic and diastolic CHF (congestive heart failure) (HCC)   Urothelial carcinoma (HCC)   Depression   CAD in native artery   HFrEF (heart failure with reduced ejection fraction) (HCC)   AKI (acute kidney injury)   Anemia due to chronic kidney disease  #1 AKI on CKD stage IV/metabolic acidosis -Baseline creatinine approximately 3.3 as of 03/2024. - Creatinine on admission as high as 6.45. - Likely secondary to a prerenal azotemia in the setting of nausea vomiting diarrhea/GI losses versus postop secondary to ureteral stent. - CT abdomen and pelvis with indwelling left ureteral stent, mild fat stranding surrounding the left renal pelvis and left ureter consistent with known history of neoplasm and no infection.  Persistent left periaortic  lymphadenopathy concerning for metastatic disease.  Distal colonic diverticulosis without diverticulitis.  Prior right nephro ureterectomy.  Aortic atherosclerosis. - Patient noted to be on IV fluids. - Urine sodium of 75, urine creatinine of 45. - Renal function slowly trending down however seems to be plateauing.. -Creatinine down to 4.64. -Acidosis improving with hydration. -Patient with urine output of 2.025 L over the past 24 hours. - Increase IV fluids to 125 cc an hour for the next 24 hours.  - Monitor urine output. -Patient assessed by urology and recommending that ureteral stent can be exchanged sometime in the near future and ideally would not be instrumented on with active infection however may reconsider should patient decompensate. -Urology following. -Discussed with nephrology and if renal function continues to improve in the next 24 hours could potentially discharge home with close outpatient follow-up with primary nephrologist. - Follow.  2.  Enterococcus faecalis UTI -Urine cultures with > 100,000 colonies of Enterococcus faecalis sensitive to ampicillin, nitrofurantoin, vancomycin .   - Was on IV Rocephin  and has been transitioned to IV ampicillin D3/7. - Continue IV antibiotics through today and can likely transition to amoxicillin 500 mg twice daily tomorrow to complete a 7 to 10-day course of antibiotic treatment.  - Supportive care.   3.  Normocytic anemia - Patient with no active bleeding. - Hemoglobin currently at 8.2.   - Follow H&H.   4.  History of bladder cancer status post right nephroureterectomy - Outpatient follow-up with primary oncologist.  5.  Hypertension - Continue Imdur , carvedilol .    6.  CAD, nonobstructive - Stable.   - Continue  aspirin , statin, Imdur , carvedilol .   7.  OSA -As needed O2 at bedtime.  8.  HFpEF -Stable. - Noted to have been on diuretics prior to admission which are currently on hold due to presentation with AKI on CKD  stage IV. - Monitor closely for volume overload with hydration. - Continue to hold diuretics.  9.  GERD - Pepcid .   10.  Hyperlipidemia - Continue Crestor  and Vascepa .    11.  Hyperkalemia -Likely secondary to AKI on CKD. - Potassium of 5.0 today.   - Patient noted to have received Lokelma  10 mg twice daily x 2 doses.   - Lokelma  10 mg x 1 dose today.  - Repeat labs in AM.    DVT prophylaxis: Heparin  Code Status: Full Family Communication: Updated patient.  No family at bedside. Disposition: Home when clinically improved and renal function back to baseline.  Status is: Inpatient Remains inpatient appropriate because: Severity of illness   Consultants:  Urology: Dr. Alvaro 07/29/2024  Procedures:  CT abdomen and pelvis 07/29/2024   Antimicrobials:  Anti-infectives (From admission, onward)    Start     Dose/Rate Route Frequency Ordered Stop   07/31/24 1000  ampicillin (OMNIPEN) 2 g in sodium chloride  0.9 % 100 mL IVPB        2 g 300 mL/hr over 20 Minutes Intravenous Every 24 hours 07/31/24 0838     07/30/24 1400  cefTRIAXone  (ROCEPHIN ) 1 g in sodium chloride  0.9 % 100 mL IVPB  Status:  Discontinued        1 g 200 mL/hr over 30 Minutes Intravenous Every 24 hours 07/29/24 1452 07/30/24 0829   07/30/24 1400  cefTRIAXone  (ROCEPHIN ) 2 g in sodium chloride  0.9 % 100 mL IVPB  Status:  Discontinued        2 g 200 mL/hr over 30 Minutes Intravenous Every 24 hours 07/30/24 0829 07/31/24 0837   07/29/24 1400  cefTRIAXone  (ROCEPHIN ) 1 g in sodium chloride  0.9 % 100 mL IVPB        1 g 200 mL/hr over 30 Minutes Intravenous  Once 07/29/24 1359 07/29/24 1458         Subjective: Patient lying in bed.  Denies any chest pain or shortness of breath.  No abdominal pain.  Good urine output.  Tolerating current diet.  Feeling well.   Objective: Vitals:   08/01/24 1324 08/01/24 1823 08/01/24 2120 08/02/24 0434  BP: 133/72  (!) 149/74 (!) 152/75  Pulse: (!) 56 60 (!) 53 (!) 57  Resp:  20  18 20   Temp: 97.6 F (36.4 C)  98.1 F (36.7 C) 97.9 F (36.6 C)  TempSrc: Oral  Oral Oral  SpO2: 96%  98% 96%  Weight:    81.7 kg  Height:        Intake/Output Summary (Last 24 hours) at 08/02/2024 1300 Last data filed at 08/02/2024 1129 Gross per 24 hour  Intake 2358.48 ml  Output 2125 ml  Net 233.48 ml   Filed Weights   07/31/24 0438 08/01/24 0451 08/02/24 0434  Weight: 79.3 kg 79 kg 81.7 kg    Examination:  General exam: NAD Respiratory system: CTAB.  No wheeze, no crackles, no rhonchi.  Fair air movement.  Speaking in full sentences.  Cardiovascular system: RRR no murmurs rubs or gallops.  No JVD.  No pitting lower extremity edema.  Gastrointestinal system: Abdomen is soft, nontender, nondistended, positive bowel sounds.  No rebound.  No guarding.  Central nervous system: Alert and oriented. No  focal neurological deficits. Extremities: Symmetric 5 x 5 power. Skin: No rashes, lesions or ulcers Psychiatry: Judgement and insight appear normal. Mood & affect appropriate.     Data Reviewed: I have personally reviewed following labs and imaging studies  CBC: Recent Labs  Lab 07/29/24 0947 07/30/24 0519 07/31/24 0828 08/01/24 0530 08/02/24 0554  WBC 9.3 7.8 7.5 6.7 6.9  NEUTROABS 6.0  --   --   --   --   HGB 9.9* 8.8* 9.1* 8.3* 8.2*  HCT 30.9* 28.9* 30.0* 28.5* 27.9*  MCV 92.8 97.3 97.7 99.3 99.3  PLT 254 234 232 217 197    Basic Metabolic Panel: Recent Labs  Lab 07/29/24 1335 07/30/24 0519 07/31/24 0828 08/01/24 0530 08/02/24 0554  NA 136 141 140 143 142  K 4.9 5.1 4.6 5.6* 5.0  CL 106 110 110 114* 115*  CO2 16* 18* 19* 18* 18*  GLUCOSE 86 84 130* 83 85  BUN 79* 79* 66* 59* 50*  CREATININE 5.77* 5.66* 4.97* 4.68* 4.64*  CALCIUM  8.8* 8.9 8.6* 8.4* 8.4*  PHOS  --   --   --  5.6* 5.4*    GFR: Estimated Creatinine Clearance: 10.4 mL/min (A) (by C-G formula based on SCr of 4.64 mg/dL (H)).  Liver Function Tests: Recent Labs  Lab  07/29/24 0947 07/30/24 0519 08/01/24 0530 08/02/24 0554  AST 17 17  --   --   ALT 20 20  --   --   ALKPHOS 61 67  --   --   BILITOT 0.2 0.2  --   --   PROT 8.1 7.1  --   --   ALBUMIN  3.5 3.3* 3.0* 3.0*    CBG: No results for input(s): GLUCAP in the last 168 hours.   Recent Results (from the past 240 hours)  Urine Culture     Status: Abnormal   Collection Time: 07/29/24 12:48 PM   Specimen: Urine, Random  Result Value Ref Range Status   Specimen Description   Final    URINE, RANDOM Performed at [facility], [address] [place], [place] [address]    Special Requests   Final    NONE Reflexed from [id number] Performed at [facility], [address] [place], [place] [address]    Culture >=100,000 COLONIES/mL ENTEROCOCCUS FAECALIS (A)  Final   Report Status 07/31/2024 FINAL  Final   Organism ID, Bacteria ENTEROCOCCUS FAECALIS (A)  Final      Susceptibility   Enterococcus faecalis - MIC*    AMPICILLIN <=2 SENSITIVE Sensitive     NITROFURANTOIN <=16 SENSITIVE Sensitive     VANCOMYCIN  1 SENSITIVE Sensitive     * >=100,000 COLONIES/mL ENTEROCOCCUS FAECALIS         Radiology Studies: No results found.       Scheduled Meds:  aspirin  EC  81 mg Oral Daily   carvedilol   6.25 mg Oral BID WC   cholecalciferol   1,000 Units Oral q morning   famotidine   10 mg Oral Daily   heparin   5,000 Units Subcutaneous Q8H   icosapent  Ethyl  1 g Oral BID   iron polysaccharides  150 mg Oral Daily   isosorbide  mononitrate  15 mg Oral Daily   rosuvastatin   10 mg Oral Daily   sertraline   50 mg Oral q morning   traZODone   150 mg Oral QHS   Continuous Infusions:  sodium chloride  125 mL/hr at 08/02/24 1118   ampicillin (OMNIPEN) IV 2 g (08/02/24 1120)  LOS: 4 days    Time spent: 35 minutes    Toribio Hummer, MD [facility]   To contact the attending provider between 7A-7P or the covering provider during after hours 7P-7A,  please log into the web site [email or web address] and access using universal [place] password for that web site. If you do not have the password, please call the hospital operator.  08/02/2024, 1:00 PM

## 2024-08-02 NOTE — Plan of Care (Signed)

## 2024-08-03 DIAGNOSIS — C689 Malignant neoplasm of urinary organ, unspecified: Secondary | ICD-10-CM | POA: Diagnosis not present

## 2024-08-03 DIAGNOSIS — I10 Essential (primary) hypertension: Secondary | ICD-10-CM | POA: Diagnosis not present

## 2024-08-03 DIAGNOSIS — E782 Mixed hyperlipidemia: Secondary | ICD-10-CM | POA: Diagnosis not present

## 2024-08-03 DIAGNOSIS — N179 Acute kidney failure, unspecified: Secondary | ICD-10-CM | POA: Diagnosis not present

## 2024-08-03 LAB — RENAL FUNCTION PANEL
Albumin: 2.9 g/dL — ABNORMAL LOW (ref 3.5–5.0)
Anion gap: 10 (ref 5–15)
BUN: 42 mg/dL — ABNORMAL HIGH (ref 8–23)
CO2: 18 mmol/L — ABNORMAL LOW (ref 22–32)
Calcium: 8.2 mg/dL — ABNORMAL LOW (ref 8.9–10.3)
Chloride: 116 mmol/L — ABNORMAL HIGH (ref 98–111)
Creatinine, Ser: 4.36 mg/dL — ABNORMAL HIGH (ref 0.44–1.00)
GFR, Estimated: 10 mL/min — ABNORMAL LOW (ref >60)
Glucose, Bld: 84 mg/dL (ref 70–99)
Phosphorus: 4.8 mg/dL — ABNORMAL HIGH (ref 2.5–4.6)
Potassium: 4.4 mmol/L (ref 3.5–5.1)
Sodium: 143 mmol/L (ref 135–145)

## 2024-08-03 LAB — CBC
HCT: 25.6 % — ABNORMAL LOW (ref 36.0–46.0)
Hemoglobin: 7.9 g/dL — ABNORMAL LOW (ref 12.0–15.0)
MCH: 30.4 pg (ref 26.0–34.0)
MCHC: 30.9 g/dL (ref 30.0–36.0)
MCV: 98.5 fL (ref 80.0–100.0)
Platelets: 187 K/uL (ref 150–400)
RBC: 2.6 MIL/uL — ABNORMAL LOW (ref 3.87–5.11)
RDW: 14.2 % (ref 11.5–15.5)
WBC: 7.1 K/uL (ref 4.0–10.5)
nRBC: 0 % (ref 0.0–0.2)

## 2024-08-03 MED ORDER — AMOXICILLIN 500 MG PO CAPS
500.0000 mg | ORAL_CAPSULE | Freq: Two times a day (BID) | ORAL | Status: DC
  Administered 2024-08-03 – 2024-08-05 (×5): 500 mg via ORAL
  Filled 2024-08-03 (×5): qty 1

## 2024-08-03 NOTE — Progress Notes (Signed)
 PROGRESS NOTE    [name]  FMW:[id number] DOB: 12/29/1945 DOA: 07/29/2024 PCP: Chrystal Lamarr RAMAN, MD    Chief Complaint  Patient presents with   Abnormal Labs    Brief Narrative:  Patient a pleasant 78 year old female history of hyperlipidemia, history of PE, hypertension, CAD nonobstructive, OSA not on CPAP, bladder cancer status post right nephroureterectomy for cancer, anemia, GERD, CHF, history of PE 2022, CKD stage IV with progression being followed by nephrology with baseline creatinine of 3.3 presenting to the ED with a 1 week history of nausea vomiting diarrhea.  Patient seen by oncologist on day of admission labs obtained with a creatinine of 6.45 with a repeat in the ED of 5.77.  Patient noted to be following urology in the outpatient setting.  Urinalysis done concerning for UTI.  CT abdomen and pelvis assessed by urology and no indication for stent exchange.  Patient placed on IV antibiotics, IV fluids, urology following.   Assessment & Plan:   Principal Problem:   Acute kidney injury superimposed on stage 4 chronic kidney disease (HCC) Active Problems:   Essential hypertension   Mixed hyperlipidemia   Chronic combined systolic and diastolic CHF (congestive heart failure) (HCC)   Urothelial carcinoma (HCC)   Depression   CAD in native artery   HFrEF (heart failure with reduced ejection fraction) (HCC)   AKI (acute kidney injury)   Anemia due to chronic kidney disease  #1 AKI on CKD stage IV/metabolic acidosis -Baseline creatinine approximately 3.3 as of 03/2024. - Creatinine on admission as high as 6.45. - Likely secondary to a prerenal azotemia in the setting of nausea vomiting diarrhea/GI losses versus postop secondary to ureteral stent. - CT abdomen and pelvis with indwelling left ureteral stent, mild fat stranding surrounding the left renal pelvis and left ureter consistent with known history of neoplasm and no infection.  Persistent left periaortic  lymphadenopathy concerning for metastatic disease.  Distal colonic diverticulosis without diverticulitis.  Prior right nephro ureterectomy.  Aortic atherosclerosis. - Patient noted to be on IV fluids. - Urine sodium of 75, urine creatinine of 45. - Renal function slowly trending down however seems to be plateauing.. -Creatinine down to 4.36 today -Acidosis improved with hydration. -Patient with urine output of 1.7 L over the past 24 hours. - Saline lock IV fluids.  - Monitor urine output. -Patient assessed by urology and recommending that ureteral stent can be exchanged sometime in the near future and ideally would not be instrumented on with active infection however may reconsider should patient decompensate. -Urology following. -Discussed with nephrology and if renal function continues to improve in the next 24 hours could potentially discharge home with close outpatient follow-up with primary nephrologist. - Follow.  2.  Enterococcus faecalis UTI -Urine cultures with > 100,000 colonies of Enterococcus faecalis sensitive to ampicillin, nitrofurantoin, vancomycin .   - Was on IV Rocephin  and transitioned to IV ampicillin D4/7. - Will transition from IV ampicillin to amoxicillin to complete a 7-10 day course of antibiotic treatment.  - Supportive care.   3.  Normocytic anemia - Patient with no active bleeding. - Likely dilutional effect.   - Hemoglobin at 7.9.  - Follow H&H.   4.  History of bladder cancer status post right nephroureterectomy - Outpatient follow-up with primary oncologist.  5.  Hypertension - Continue carvedilol , Imdur .     6.  CAD, nonobstructive - Stable.   - Continue aspirin , statin, Imdur , carvedilol .   7.  OSA -As needed O2 at bedtime.  8.  HFpEF -Stable. - Noted to have been on diuretics prior to admission which are currently on hold due to presentation with AKI on CKD stage IV. - Monitor closely for volume overload with hydration. - Continue to hold  diuretics. - Saline lock IV fluids.  9.  GERD - Continue Pepcid .   10.  Hyperlipidemia - Continue Crestor  and Vascepa .    11.  Hyperkalemia -Likely secondary to AKI on CKD. - Status post Lokelma .   - Potassium at 4.4.    DVT prophylaxis: Heparin  Code Status: Full Family Communication: Updated patient.  No family at bedside. Disposition: Home when clinically improved and renal function back to baseline.  Status is: Inpatient Remains inpatient appropriate because: Severity of illness   Consultants:  Urology: Dr. Alvaro 07/29/2024  Procedures:  CT abdomen and pelvis 07/29/2024   Antimicrobials:  Anti-infectives (From admission, onward)    Start     Dose/Rate Route Frequency Ordered Stop   08/03/24 1000  amoxicillin (AMOXIL) capsule 500 mg        500 mg Oral Every 12 hours 08/03/24 0753 08/07/24 0959   07/31/24 1000  ampicillin (OMNIPEN) 2 g in sodium chloride  0.9 % 100 mL IVPB  Status:  Discontinued        2 g 300 mL/hr over 20 Minutes Intravenous Every 24 hours 07/31/24 0838 08/03/24 0753   07/30/24 1400  cefTRIAXone  (ROCEPHIN ) 1 g in sodium chloride  0.9 % 100 mL IVPB  Status:  Discontinued        1 g 200 mL/hr over 30 Minutes Intravenous Every 24 hours 07/29/24 1452 07/30/24 0829   07/30/24 1400  cefTRIAXone  (ROCEPHIN ) 2 g in sodium chloride  0.9 % 100 mL IVPB  Status:  Discontinued        2 g 200 mL/hr over 30 Minutes Intravenous Every 24 hours 07/30/24 0829 07/31/24 0837   07/29/24 1400  cefTRIAXone  (ROCEPHIN ) 1 g in sodium chloride  0.9 % 100 mL IVPB        1 g 200 mL/hr over 30 Minutes Intravenous  Once 07/29/24 1359 07/29/24 1458         Subjective: Patient sitting up in bed.  Denies any chest pain.  Denies any significant shortness of breath.  Does endorse some wheezing this morning.  Tolerating current diet.  Objective: Vitals:   08/02/24 2015 08/02/24 2016 08/03/24 0435 08/03/24 0500  BP: (!) 160/64 (!) 160/64 (!) 152/70   Pulse: (!) 58 (!) 58 63    Resp:      Temp: 98.8 F (37.1 C) 98.8 F (37.1 C) 98 F (36.7 C)   TempSrc: Oral Oral Oral   SpO2: 97%  100%   Weight:    88.4 kg  Height:        Intake/Output Summary (Last 24 hours) at 08/03/2024 1153 Last data filed at 08/03/2024 1100 Gross per 24 hour  Intake 2730.76 ml  Output 2200 ml  Net 530.76 ml   Filed Weights   08/01/24 0451 08/02/24 0434 08/03/24 0500  Weight: 79 kg 81.7 kg 88.4 kg    Examination:  General exam: NAD Respiratory system: Minimal bibasilar crackles.  No wheezing, no rhonchi, fair air movement.  Speaking in full sentences.  No use of accessory muscles of respiration.  Cardiovascular system: Regular rate rhythm no murmurs rubs or gallops.  No JVD.  No pitting lower extremity edema.  Gastrointestinal system: Abdomen is soft, nontender, nondistended, positive bowel sounds.  No rebound.  No guarding.  Central nervous system: Alert  and oriented. No focal neurological deficits. Extremities: Symmetric 5 x 5 power. Skin: No rashes, lesions or ulcers Psychiatry: Judgement and insight appear normal. Mood & affect appropriate.     Data Reviewed: I have personally reviewed following labs and imaging studies  CBC: Recent Labs  Lab 07/29/24 0947 07/30/24 0519 07/31/24 0828 08/01/24 0530 08/02/24 0554 08/03/24 0547  WBC 9.3 7.8 7.5 6.7 6.9 7.1  NEUTROABS 6.0  --   --   --   --   --   HGB 9.9* 8.8* 9.1* 8.3* 8.2* 7.9*  HCT 30.9* 28.9* 30.0* 28.5* 27.9* 25.6*  MCV 92.8 97.3 97.7 99.3 99.3 98.5  PLT 254 234 232 217 197 187    Basic Metabolic Panel: Recent Labs  Lab 07/30/24 0519 07/31/24 0828 08/01/24 0530 08/02/24 0554 08/03/24 0547  NA 141 140 143 142 143  K 5.1 4.6 5.6* 5.0 4.4  CL 110 110 114* 115* 116*  CO2 18* 19* 18* 18* 18*  GLUCOSE 84 130* 83 85 84  BUN 79* 66* 59* 50* 42*  CREATININE 5.66* 4.97* 4.68* 4.64* 4.36*  CALCIUM  8.9 8.6* 8.4* 8.4* 8.2*  PHOS  --   --  5.6* 5.4* 4.8*    GFR: Estimated Creatinine Clearance: 11.6  mL/min (A) (by C-G formula based on SCr of 4.36 mg/dL (H)).  Liver Function Tests: Recent Labs  Lab 07/29/24 0947 07/30/24 0519 08/01/24 0530 08/02/24 0554 08/03/24 0547  AST 17 17  --   --   --   ALT 20 20  --   --   --   ALKPHOS 61 67  --   --   --   BILITOT 0.2 0.2  --   --   --   PROT 8.1 7.1  --   --   --   ALBUMIN  3.5 3.3* 3.0* 3.0* 2.9*    CBG: No results for input(s): GLUCAP in the last 168 hours.   Recent Results (from the past 240 hours)  Urine Culture     Status: Abnormal   Collection Time: 07/29/24 12:48 PM   Specimen: Urine, Random  Result Value Ref Range Status   Specimen Description   Final    URINE, RANDOM Performed at [facility], [address] [place], [place] [address]    Special Requests   Final    NONE Reflexed from [id number] Performed at [facility], [address], [place], [place] [address]    Culture >=100,000 COLONIES/mL ENTEROCOCCUS FAECALIS (A)  Final   Report Status 07/31/2024 FINAL  Final   Organism ID, Bacteria ENTEROCOCCUS FAECALIS (A)  Final      Susceptibility   Enterococcus faecalis - MIC*    AMPICILLIN <=2 SENSITIVE Sensitive     NITROFURANTOIN <=16 SENSITIVE Sensitive     VANCOMYCIN  1 SENSITIVE Sensitive     * >=100,000 COLONIES/mL ENTEROCOCCUS FAECALIS         Radiology Studies: No results found.       Scheduled Meds:  amoxicillin  500 mg Oral Q12H   aspirin  EC  81 mg Oral Daily   carvedilol   6.25 mg Oral BID WC   cholecalciferol   1,000 Units Oral q morning   famotidine   10 mg Oral Daily   heparin   5,000 Units Subcutaneous Q8H   icosapent  Ethyl  1 g Oral BID   iron polysaccharides  150 mg Oral Daily   isosorbide  mononitrate  15 mg Oral Daily   rosuvastatin   10 mg Oral Daily  sertraline   50 mg Oral q morning   traZODone   150 mg Oral QHS   Continuous Infusions:     LOS: 5 days    Time spent: 35 minutes    Toribio Hummer, MD [facility]   To  contact the attending provider between 7A-7P or the covering provider during after hours 7P-7A, please log into the web site [email or web address] and access using universal [place] password for that web site. If you do not have the password, please call the hospital operator.  08/03/2024, 11:53 AM

## 2024-08-04 DIAGNOSIS — D631 Anemia in chronic kidney disease: Secondary | ICD-10-CM | POA: Diagnosis not present

## 2024-08-04 DIAGNOSIS — I10 Essential (primary) hypertension: Secondary | ICD-10-CM | POA: Diagnosis not present

## 2024-08-04 DIAGNOSIS — N184 Chronic kidney disease, stage 4 (severe): Secondary | ICD-10-CM | POA: Diagnosis not present

## 2024-08-04 DIAGNOSIS — C689 Malignant neoplasm of urinary organ, unspecified: Secondary | ICD-10-CM | POA: Diagnosis not present

## 2024-08-04 DIAGNOSIS — E782 Mixed hyperlipidemia: Secondary | ICD-10-CM | POA: Diagnosis not present

## 2024-08-04 DIAGNOSIS — N179 Acute kidney failure, unspecified: Secondary | ICD-10-CM | POA: Diagnosis not present

## 2024-08-04 LAB — BASIC METABOLIC PANEL WITH GFR
Anion gap: 12 (ref 5–15)
BUN: 42 mg/dL — ABNORMAL HIGH (ref 8–23)
CO2: 17 mmol/L — ABNORMAL LOW (ref 22–32)
Calcium: 8.5 mg/dL — ABNORMAL LOW (ref 8.9–10.3)
Chloride: 114 mmol/L — ABNORMAL HIGH (ref 98–111)
Creatinine, Ser: 4.35 mg/dL — ABNORMAL HIGH (ref 0.44–1.00)
GFR, Estimated: 10 mL/min — ABNORMAL LOW (ref >60)
Glucose, Bld: 86 mg/dL (ref 70–99)
Potassium: 3.8 mmol/L (ref 3.5–5.1)
Sodium: 142 mmol/L (ref 135–145)

## 2024-08-04 LAB — CBC WITH DIFFERENTIAL/PLATELET
Abs Immature Granulocytes: 0.03 K/uL (ref 0.00–0.07)
Basophils Absolute: 0.1 K/uL (ref 0.0–0.1)
Basophils Relative: 1 %
Eosinophils Absolute: 0.3 K/uL (ref 0.0–0.5)
Eosinophils Relative: 4 %
HCT: 26.3 % — ABNORMAL LOW (ref 36.0–46.0)
Hemoglobin: 7.8 g/dL — ABNORMAL LOW (ref 12.0–15.0)
Immature Granulocytes: 0 %
Lymphocytes Relative: 32 %
Lymphs Abs: 2.5 K/uL (ref 0.7–4.0)
MCH: 28.9 pg (ref 26.0–34.0)
MCHC: 29.7 g/dL — ABNORMAL LOW (ref 30.0–36.0)
MCV: 97.4 fL (ref 80.0–100.0)
Monocytes Absolute: 0.8 K/uL (ref 0.1–1.0)
Monocytes Relative: 10 %
Neutro Abs: 4.2 K/uL (ref 1.7–7.7)
Neutrophils Relative %: 53 %
Platelets: 178 K/uL (ref 150–400)
RBC: 2.7 MIL/uL — ABNORMAL LOW (ref 3.87–5.11)
RDW: 14.2 % (ref 11.5–15.5)
WBC: 7.9 K/uL (ref 4.0–10.5)
nRBC: 0 % (ref 0.0–0.2)

## 2024-08-04 LAB — RENAL FUNCTION PANEL
Albumin: 2.9 g/dL — ABNORMAL LOW (ref 3.5–5.0)
Anion gap: 12 (ref 5–15)
BUN: 42 mg/dL — ABNORMAL HIGH (ref 8–23)
CO2: 17 mmol/L — ABNORMAL LOW (ref 22–32)
Calcium: 8.5 mg/dL — ABNORMAL LOW (ref 8.9–10.3)
Chloride: 114 mmol/L — ABNORMAL HIGH (ref 98–111)
Creatinine, Ser: 4.29 mg/dL — ABNORMAL HIGH (ref 0.44–1.00)
GFR, Estimated: 10 mL/min — ABNORMAL LOW (ref >60)
Glucose, Bld: 86 mg/dL (ref 70–99)
Phosphorus: 4.7 mg/dL — ABNORMAL HIGH (ref 2.5–4.6)
Potassium: 3.9 mmol/L (ref 3.5–5.1)
Sodium: 144 mmol/L (ref 135–145)

## 2024-08-04 MED ORDER — FUROSEMIDE 10 MG/ML IJ SOLN
20.0000 mg | Freq: Two times a day (BID) | INTRAMUSCULAR | Status: DC
  Administered 2024-08-04 – 2024-08-05 (×3): 20 mg via INTRAVENOUS
  Filled 2024-08-04 (×3): qty 2

## 2024-08-04 NOTE — Plan of Care (Signed)
  Problem: Education: Goal: Ability to demonstrate management of disease process will improve Outcome: Progressing Goal: Ability to verbalize understanding of medication therapies will improve Outcome: Progressing   Problem: Activity: Goal: Capacity to carry out activities will improve Outcome: Progressing   Problem: Cardiac: Goal: Ability to achieve and maintain adequate cardiopulmonary perfusion will improve Outcome: Progressing   Problem: Education: Goal: Knowledge of General Education information will improve Description: Including pain rating scale, medication(s)/side effects and non-pharmacologic comfort measures Outcome: Progressing   Problem: Health Behavior/Discharge Planning: Goal: Ability to manage health-related needs will improve Outcome: Progressing   Problem: Clinical Measurements: Goal: Ability to maintain clinical measurements within normal limits will improve Outcome: Progressing Goal: Will remain free from infection Outcome: Progressing Goal: Diagnostic test results will improve Outcome: Progressing Goal: Respiratory complications will improve Outcome: Progressing Goal: Cardiovascular complication will be avoided Outcome: Progressing   Problem: Activity: Goal: Risk for activity intolerance will decrease Outcome: Progressing   Problem: Nutrition: Goal: Adequate nutrition will be maintained Outcome: Progressing   Problem: Coping: Goal: Level of anxiety will decrease Outcome: Progressing   Problem: Elimination: Goal: Will not experience complications related to bowel motility Outcome: Progressing Goal: Will not experience complications related to urinary retention Outcome: Progressing   Problem: Pain Managment: Goal: General experience of comfort will improve and/or be controlled Outcome: Progressing   Problem: Safety: Goal: Ability to remain free from injury will improve Outcome: Progressing   Problem: Skin Integrity: Goal: Risk for  impaired skin integrity will decrease Outcome: Progressing

## 2024-08-04 NOTE — Progress Notes (Signed)
 [name]   DOB:Aug 03, 1946   FM#:[id number]      ASSESSMENT & PLAN:  [name] is a 78 year old female patient with oncologic history significant for urothelial cancer.  Admitted on 07/29/2024 due to abnormal labs.  Medical oncology following.  AKI on CKD - Creatinine elevated 5.77 on 07/29/2024.  Has decreased to 4.29 today.  Also significant decrease in BUN from 79 to 42 today. - Patient denies dysuria or hematuria - IV hydration ongoing. - Avoid nephrotoxic agents - Continue to monitor renal function - Urology following.  UTI - Status post antibiotics - WBC stable 7.9 - Continue to monitor closely  History of high-grade urothelial carcinoma - Diagnosed 2008, right distal ureter - Status post right nephro ureterectomy - Patient had cystoscopy with stent placement in September 2020.  Cytology at that time showed high-grade urothelial carcinoma.  She received BCG treatment. - Status post Jelmyto  instillation in September 2022 and completed May 2023. - Medical oncology/Dr. Onesimo following closely and will make further treatment recommendations.  Anemia - Hemoglobin low 7.8 - Recommend PRBC transfusion for Hgb <7.0 - Continue to monitor CBC with differential    Code Status Full   Subjective:  Patient seen awake and alert laying in bed.  Reports that she is urinating well, denies dysuria or hematuria.  States she feels much better than she did when she was admitted.  Denies shortness of breath or other acute symptoms. Eating well.  States she may be going home today.  No other acute complaints offered.  Objective:   Intake/Output Summary (Last 24 hours) at 08/04/2024 1010 Last data filed at 08/04/2024 9063 Gross per 24 hour  Intake 240 ml  Output 2300 ml  Net -2060 ml     PHYSICAL EXAMINATION: ECOG PERFORMANCE STATUS: 2 - Symptomatic, <50% confined to bed  Vitals:   08/03/24 2159 08/04/24 0332  BP: (!) 164/60 (!) 154/66  Pulse: 60 (!) 59  Resp:   18  Temp:  98.3 F (36.8 C)  SpO2:  95%   Filed Weights   08/02/24 0434 08/03/24 0500 08/04/24 0500  Weight: 180 lb 1.6 oz (81.7 kg) 194 lb 14.2 oz (88.4 kg) 190 lb 14.7 oz (86.6 kg)    GENERAL: alert, no distress and comfortable SKIN: +pale skin color, texture, turgor are normal, no rashes or significant lesions EYES: normal, conjunctiva are pink and non-injected, sclera clear OROPHARYNX: no exudate, no erythema and lips, buccal mucosa, and tongue normal  NECK: supple, thyroid  normal size, non-tender, without nodularity LYMPH: no palpable lymphadenopathy in the cervical, axillary or inguinal LUNGS: clear to auscultation and percussion with normal breathing effort HEART: regular rate & rhythm and no murmurs and no lower extremity edema ABDOMEN: abdomen soft, non-tender and normal bowel sounds MUSCULOSKELETAL: no cyanosis of digits and no clubbing  PSYCH: alert & oriented x 3 with fluent speech NEURO: no focal motor/sensory deficits   All questions were answered. The patient knows to call the clinic with any problems, questions or concerns.   The total time spent in the appointment was 40 minutes encounter with patient including review of chart and various tests results, discussions about plan of care and coordination of care plan  Olam JINNY Brunner, NP 08/04/2024 10:10 AM    Labs Reviewed:  Lab Results  Component Value Date   WBC 7.9 08/04/2024   HGB 7.8 (L) 08/04/2024   HCT 26.3 (L) 08/04/2024   MCV 97.4 08/04/2024   PLT 178 08/04/2024   Recent  Labs    03/28/24 0529 03/29/24 0827 07/29/24 9052 07/29/24 1335 07/30/24 0519 07/31/24 0828 08/02/24 0554 08/03/24 0547 08/04/24 0512  NA 134*   < > 137   < > 141   < > 142 143 142  144  K 3.9   < > 4.5   < > 5.1   < > 5.0 4.4 3.8  3.9  CL 103   < > 106   < > 110   < > 115* 116* 114*  114*  CO2 18*   < > 21*   < > 18*   < > 18* 18* 17*  17*  GLUCOSE 85   < > 140*   < > 84   < > 85 84 86  86  BUN 76*   < > 84*   < >  79*   < > 50* 42* 42*  42*  CREATININE 7.04*   < > 6.45*   < > 5.66*   < > 4.64* 4.36* 4.35*  4.29*  CALCIUM  7.9*   < > 9.7   < > 8.9   < > 8.4* 8.2* 8.5*  8.5*  GFRNONAA 6*   < > 6*   < > 7*   < > 9* 10* 10*  10*  PROT 6.9  --  8.1  --  7.1  --   --   --   --   ALBUMIN  2.5*  --  3.5  --  3.3*   < > 3.0* 2.9* 2.9*  AST 39  --  17  --  17  --   --   --   --   ALT 34  --  20  --  20  --   --   --   --   ALKPHOS 93  --  61  --  67  --   --   --   --   BILITOT 0.5  --  0.2  --  0.2  --   --   --   --    < > = values in this interval not displayed.    Studies Reviewed:  CT ABDOMEN PELVIS WO CONTRAST Result Date: 07/29/2024 CLINICAL DATA:  Nausea and vomiting for 1 week, fever, prior right nephrectomy, indwelling left ureteral stent, history of left ureteral cancer EXAM: CT ABDOMEN AND PELVIS WITHOUT CONTRAST TECHNIQUE: Multidetector CT imaging of the abdomen and pelvis was performed following the standard protocol without IV contrast. RADIATION DOSE REDUCTION: This exam was performed according to the departmental dose-optimization program which includes automated exposure control, adjustment of the mA and/or kV according to patient size and/or use of iterative reconstruction technique. COMPARISON:  03/27/2024 FINDINGS: Lower chest: No acute pleural or parenchymal lung disease. Small hiatal hernia. Hepatobiliary: Unremarkable unenhanced appearance of the liver and gallbladder. No biliary duct dilation. Pancreas: Unremarkable unenhanced appearance. Spleen: Unremarkable unenhanced appearance. Adrenals/Urinary Tract: The adrenals are unremarkable. Prior right nephro ureterectomy with no abnormalities in the nephrectomy bed. There is an indwelling left ureteral stent extending from the left renal pelvis into the bladder lumen. No urinary tract calculi or obstructive uropathy. There is mild fat stranding surrounding the left renal pelvis and left ureter, which could be related to infection or patient's  known history of neoplasm. The bladder is unremarkable. Stomach/Bowel: No bowel obstruction or ileus. Normal appendix right lower quadrant. Distal colonic diverticulosis without diverticulitis. No bowel wall thickening or inflammatory change. Small hiatal hernia. Vascular/Lymphatic: Stable aortic atherosclerosis. Assessment of  the vascular lumen is limited without intravenous contrast. Multiple enlarged retroperitoneal lymph nodes are again identified, largest in the left para-aortic region measuring 11 mm in short axis, reference image 45/2, stable. No new adenopathy. Reproductive: Status post hysterectomy. No adnexal masses. Other: No free fluid or free intraperitoneal gas. No abdominal wall hernia. Musculoskeletal: No acute or destructive bony abnormalities. Reconstructed images demonstrate no additional findings. IMPRESSION: 1. Indwelling left ureteral stent as above, with mild fat stranding surrounding the left renal pelvis and left ureter consistent with known history of neoplasm and/or infection. Please correlate with urinalysis. 2. Persistent left para-aortic lymphadenopathy, concerning for metastatic disease. 3. Distal colonic diverticulosis without diverticulitis. 4. Prior right nephro ureterectomy. 5.  Aortic Atherosclerosis (ICD10-I70.0). Electronically Signed   By: Ozell Daring M.D.   On: 07/29/2024 15:30

## 2024-08-04 NOTE — Progress Notes (Signed)
 PROGRESS NOTE    [name]  FMW:[id number] DOB: 07-29-1946 DOA: 07/29/2024 PCP: Chrystal Lamarr RAMAN, MD    Chief Complaint  Patient presents with   Abnormal Labs    Brief Narrative:  Patient a pleasant 78 year old female history of hyperlipidemia, history of PE, hypertension, CAD nonobstructive, OSA not on CPAP, bladder cancer status post right nephroureterectomy for cancer, anemia, GERD, CHF, history of PE 2022, CKD stage IV with progression being followed by nephrology with baseline creatinine of 3.3 presenting to the ED with a 1 week history of nausea vomiting diarrhea.  Patient seen by oncologist on day of admission labs obtained with a creatinine of 6.45 with a repeat in the ED of 5.77.  Patient noted to be following urology in the outpatient setting.  Urinalysis done concerning for UTI.  CT abdomen and pelvis assessed by urology and no indication for stent exchange.  Patient placed on IV antibiotics, IV fluids, urology following.   Assessment & Plan:   Principal Problem:   Acute kidney injury superimposed on stage 4 chronic kidney disease (HCC) Active Problems:   Essential hypertension   Mixed hyperlipidemia   Chronic combined systolic and diastolic CHF (congestive heart failure) (HCC)   Urothelial carcinoma (HCC)   Depression   CAD in native artery   HFrEF (heart failure with reduced ejection fraction) (HCC)   AKI (acute kidney injury)   Anemia due to chronic kidney disease  #1 AKI on CKD stage IV/metabolic acidosis -Baseline creatinine approximately 3.3 as of 03/2024. - Creatinine on admission as high as 6.45. - Likely secondary to a prerenal azotemia in the setting of nausea vomiting diarrhea/GI losses versus postop secondary to ureteral stent. - CT abdomen and pelvis with indwelling left ureteral stent, mild fat stranding surrounding the left renal pelvis and left ureter consistent with known history of neoplasm and no infection.  Persistent left periaortic  lymphadenopathy concerning for metastatic disease.  Distal colonic diverticulosis without diverticulitis.  Prior right nephro ureterectomy.  Aortic atherosclerosis. - Patient noted to be on IV fluids. - Urine sodium of 75, urine creatinine of 45. - Renal function slowly trending down however seems to be plateauing.. -Creatinine down to 4.35 today -Acidosis improved with hydration. -Patient with urine output of 1.5 L over the past 24 hours. - Saline lock IV fluids.  -Patient with some complaints of shortness of breath overnight and stated required O2 currently denies any shortness of breath. -Lasix  20 mg IV every 12 hours. - Monitor urine output. -Patient assessed by urology and recommending that ureteral stent can be exchanged sometime in the near future and ideally would not be instrumented on with active infection however may reconsider should patient decompensate. -Urology following. -Discussed with nephrology and if renal function continues to improve in the next 24 hours could potentially discharge home with close outpatient follow-up with primary nephrologist. - Follow.  2.  Enterococcus faecalis UTI -Urine cultures with > 100,000 colonies of Enterococcus faecalis sensitive to ampicillin, nitrofurantoin, vancomycin .   - Was on IV Rocephin  and transitioned to IV ampicillin and subsequently transition to amoxicillin (08/03/2024) antibiotic day 5/7-10 - Supportive care.   3.  Normocytic anemia - Patient with no active bleeding. - Likely dilutional effect.   - Hemoglobin stable at 7.8.  - Follow H&H.   4.  History of bladder cancer status post right nephroureterectomy - Outpatient follow-up with primary oncologist.  5.  Hypertension - Continue Imdur , carvedilol .      6.  CAD, nonobstructive - Stable.   -  Continue aspirin , statin, Imdur , carvedilol .   7.  OSA -As needed O2 at bedtime.  8.  HFpEF -Stable. - Noted to not have been on diuretics prior to admission after review  of med rec again today.  -Patient with some complaints of shortness of breath overnight which has since improved. -Patient states had to go on oxygen overnight currently on room air with sats of 95%. -Patient hydrated on admission due to AKI on CKD. -IV fluids have been saline locked since 08/03/2024. -Patient with a urine output of 1.5 L over the past 24 hours. -Lasix  20 mg IV every 12 hours. -Monitor urine output.  9.  GERD - Pepcid .   10.  Hyperlipidemia - Continue Crestor  and Vascepa .    11.  Hyperkalemia -Likely secondary to AKI on CKD. - Status post Lokelma .   - Potassium at 3.8.   DVT prophylaxis: Heparin  Code Status: Full Family Communication: Updated patient.  No family at bedside. Disposition: Home when clinically improved and renal function back to baseline.  Status is: Inpatient Remains inpatient appropriate because: Severity of illness   Consultants:  Urology: Dr. Alvaro 07/29/2024  Procedures:  CT abdomen and pelvis 07/29/2024   Antimicrobials:  Anti-infectives (From admission, onward)    Start     Dose/Rate Route Frequency Ordered Stop   08/03/24 1000  amoxicillin (AMOXIL) capsule 500 mg        500 mg Oral Every 12 hours 08/03/24 0753 08/07/24 0959   07/31/24 1000  ampicillin (OMNIPEN) 2 g in sodium chloride  0.9 % 100 mL IVPB  Status:  Discontinued        2 g 300 mL/hr over 20 Minutes Intravenous Every 24 hours 07/31/24 0838 08/03/24 0753   07/30/24 1400  cefTRIAXone  (ROCEPHIN ) 1 g in sodium chloride  0.9 % 100 mL IVPB  Status:  Discontinued        1 g 200 mL/hr over 30 Minutes Intravenous Every 24 hours 07/29/24 1452 07/30/24 0829   07/30/24 1400  cefTRIAXone  (ROCEPHIN ) 2 g in sodium chloride  0.9 % 100 mL IVPB  Status:  Discontinued        2 g 200 mL/hr over 30 Minutes Intravenous Every 24 hours 07/30/24 0829 07/31/24 0837   07/29/24 1400  cefTRIAXone  (ROCEPHIN ) 1 g in sodium chloride  0.9 % 100 mL IVPB        1 g 200 mL/hr over 30 Minutes Intravenous   Once 07/29/24 1359 07/29/24 1458         Subjective: Patient sitting up on the side of the bed eating breakfast.  Stated she required O2 last night with some shortness of breath.  Currently denying any significant shortness of breath or chest pain.  States blood pressure also unusually elevated.  Tolerating current diet.  Having good urine output.   Objective: Vitals:   08/03/24 2115 08/03/24 2159 08/04/24 0332 08/04/24 0500  BP: (!) 181/76 (!) 164/60 (!) 154/66   Pulse:  60 (!) 59   Resp:   18   Temp:   98.3 F (36.8 C)   TempSrc:   Oral   SpO2:   95%   Weight:    86.6 kg  Height:        Intake/Output Summary (Last 24 hours) at 08/04/2024 0903 Last data filed at 08/03/2024 1843 Gross per 24 hour  Intake 240 ml  Output 1500 ml  Net -1260 ml   Filed Weights   08/02/24 0434 08/03/24 0500 08/04/24 0500  Weight: 81.7 kg 88.4 kg 86.6 kg  Examination:  General exam: NAD Respiratory system: Some bibasilar crackles.  No wheezing.  No rhonchi.  Fair air movement.  Speaking in full sentences.  No use of accessory muscles of respiration.  Cardiovascular system: RRR no murmurs rubs or gallops.  No significant JVD.  No pitting lower extremity edema. Gastrointestinal system: Abdomen is soft, nontender, nondistended, positive bowel sounds.  No rebound.  No guarding.  Central nervous system: Alert and oriented. No focal neurological deficits. Extremities: Symmetric 5 x 5 power. Skin: No rashes, lesions or ulcers Psychiatry: Judgement and insight appear normal. Mood & affect appropriate.     Data Reviewed: I have personally reviewed following labs and imaging studies  CBC: Recent Labs  Lab 07/29/24 0947 07/30/24 0519 07/31/24 0828 08/01/24 0530 08/02/24 0554 08/03/24 0547 08/04/24 0512  WBC 9.3   < > 7.5 6.7 6.9 7.1 7.9  NEUTROABS 6.0  --   --   --   --   --  4.2  HGB 9.9*   < > 9.1* 8.3* 8.2* 7.9* 7.8*  HCT 30.9*   < > 30.0* 28.5* 27.9* 25.6* 26.3*  MCV 92.8   < >  97.7 99.3 99.3 98.5 97.4  PLT 254   < > 232 217 197 187 178   < > = values in this interval not displayed.    Basic Metabolic Panel: Recent Labs  Lab 07/31/24 0828 08/01/24 0530 08/02/24 0554 08/03/24 0547 08/04/24 0512  NA 140 143 142 143 142  144  K 4.6 5.6* 5.0 4.4 3.8  3.9  CL 110 114* 115* 116* 114*  114*  CO2 19* 18* 18* 18* 17*  17*  GLUCOSE 130* 83 85 84 86  86  BUN 66* 59* 50* 42* 42*  42*  CREATININE 4.97* 4.68* 4.64* 4.36* 4.35*  4.29*  CALCIUM  8.6* 8.4* 8.4* 8.2* 8.5*  8.5*  PHOS  --  5.6* 5.4* 4.8* 4.7*    GFR: Estimated Creatinine Clearance: 11.6 mL/min (A) (by C-G formula based on SCr of 4.29 mg/dL (H)).  Liver Function Tests: Recent Labs  Lab 07/29/24 0947 07/30/24 0519 08/01/24 0530 08/02/24 0554 08/03/24 0547 08/04/24 0512  AST 17 17  --   --   --   --   ALT 20 20  --   --   --   --   ALKPHOS 61 67  --   --   --   --   BILITOT 0.2 0.2  --   --   --   --   PROT 8.1 7.1  --   --   --   --   ALBUMIN  3.5 3.3* 3.0* 3.0* 2.9* 2.9*    CBG: No results for input(s): GLUCAP in the last 168 hours.   Recent Results (from the past 240 hours)  Urine Culture     Status: Abnormal   Collection Time: 07/29/24 12:48 PM   Specimen: Urine, Random  Result Value Ref Range Status   Specimen Description   Final    URINE, RANDOM Performed at [facility], [address], [place], [place] [address]    Special Requests   Final    NONE Reflexed from [id number] Performed at [facility], [address] [place], [place] [address]    Culture >=100,000 COLONIES/mL ENTEROCOCCUS FAECALIS (A)  Final   Report Status 07/31/2024 FINAL  Final   Organism ID, Bacteria ENTEROCOCCUS FAECALIS (A)  Final      Susceptibility   Enterococcus faecalis - MIC*  AMPICILLIN <=2 SENSITIVE Sensitive     NITROFURANTOIN <=16 SENSITIVE Sensitive     VANCOMYCIN  1 SENSITIVE Sensitive     * >=100,000 COLONIES/mL ENTEROCOCCUS FAECALIS          Radiology Studies: No results found.       Scheduled Meds:  amoxicillin  500 mg Oral Q12H   aspirin  EC  81 mg Oral Daily   carvedilol   6.25 mg Oral BID WC   cholecalciferol   1,000 Units Oral q morning   famotidine   10 mg Oral Daily   furosemide   20 mg Intravenous Q12H   heparin   5,000 Units Subcutaneous Q8H   icosapent  Ethyl  1 g Oral BID   iron polysaccharides  150 mg Oral Daily   isosorbide  mononitrate  15 mg Oral Daily   rosuvastatin   10 mg Oral Daily   sertraline   50 mg Oral q morning   traZODone   150 mg Oral QHS   Continuous Infusions:     LOS: 6 days    Time spent: 40 minutes    Toribio Hummer, MD [facility]   To contact the attending provider between 7A-7P or the covering provider during after hours 7P-7A, please log into the web site [email or web address] and access using universal [place] password for that web site. If you do not have the password, please call the hospital operator.  08/04/2024, 9:03 AM

## 2024-08-05 ENCOUNTER — Other Ambulatory Visit (HOSPITAL_COMMUNITY): Payer: Self-pay

## 2024-08-05 DIAGNOSIS — E782 Mixed hyperlipidemia: Secondary | ICD-10-CM | POA: Diagnosis not present

## 2024-08-05 DIAGNOSIS — N184 Chronic kidney disease, stage 4 (severe): Secondary | ICD-10-CM | POA: Diagnosis not present

## 2024-08-05 DIAGNOSIS — N3000 Acute cystitis without hematuria: Secondary | ICD-10-CM

## 2024-08-05 DIAGNOSIS — I5042 Chronic combined systolic (congestive) and diastolic (congestive) heart failure: Secondary | ICD-10-CM | POA: Diagnosis not present

## 2024-08-05 DIAGNOSIS — I2510 Atherosclerotic heart disease of native coronary artery without angina pectoris: Secondary | ICD-10-CM

## 2024-08-05 DIAGNOSIS — N179 Acute kidney failure, unspecified: Secondary | ICD-10-CM | POA: Diagnosis not present

## 2024-08-05 LAB — RENAL FUNCTION PANEL
Albumin: 3.1 g/dL — ABNORMAL LOW (ref 3.5–5.0)
Anion gap: 11 (ref 5–15)
BUN: 41 mg/dL — ABNORMAL HIGH (ref 8–23)
CO2: 20 mmol/L — ABNORMAL LOW (ref 22–32)
Calcium: 8.8 mg/dL — ABNORMAL LOW (ref 8.9–10.3)
Chloride: 111 mmol/L (ref 98–111)
Creatinine, Ser: 4.41 mg/dL — ABNORMAL HIGH (ref 0.44–1.00)
GFR, Estimated: 10 mL/min — ABNORMAL LOW (ref >60)
Glucose, Bld: 93 mg/dL (ref 70–99)
Phosphorus: 4.4 mg/dL (ref 2.5–4.6)
Potassium: 3.4 mmol/L — ABNORMAL LOW (ref 3.5–5.1)
Sodium: 142 mmol/L (ref 135–145)

## 2024-08-05 LAB — CBC WITH DIFFERENTIAL/PLATELET
Abs Immature Granulocytes: 0.06 K/uL (ref 0.00–0.07)
Basophils Absolute: 0 K/uL (ref 0.0–0.1)
Basophils Relative: 0 %
Eosinophils Absolute: 0.4 K/uL (ref 0.0–0.5)
Eosinophils Relative: 4 %
HCT: 28 % — ABNORMAL LOW (ref 36.0–46.0)
Hemoglobin: 8.7 g/dL — ABNORMAL LOW (ref 12.0–15.0)
Immature Granulocytes: 1 %
Lymphocytes Relative: 21 %
Lymphs Abs: 1.8 K/uL (ref 0.7–4.0)
MCH: 29.5 pg (ref 26.0–34.0)
MCHC: 31.1 g/dL (ref 30.0–36.0)
MCV: 94.9 fL (ref 80.0–100.0)
Monocytes Absolute: 0.8 K/uL (ref 0.1–1.0)
Monocytes Relative: 9 %
Neutro Abs: 5.6 K/uL (ref 1.7–7.7)
Neutrophils Relative %: 65 %
Platelets: 185 K/uL (ref 150–400)
RBC: 2.95 MIL/uL — ABNORMAL LOW (ref 3.87–5.11)
RDW: 14.3 % (ref 11.5–15.5)
WBC: 8.6 K/uL (ref 4.0–10.5)
nRBC: 0 % (ref 0.0–0.2)

## 2024-08-05 MED ORDER — POLYSACCHARIDE IRON COMPLEX 150 MG PO CAPS
150.0000 mg | ORAL_CAPSULE | Freq: Every day | ORAL | 0 refills | Status: DC
  Filled 2024-08-05: qty 30, 30d supply, fill #0

## 2024-08-05 MED ORDER — SENNOSIDES-DOCUSATE SODIUM 8.6-50 MG PO TABS: 1.0000 | ORAL_TABLET | Freq: Every day | ORAL | Status: DC

## 2024-08-05 MED ORDER — SENNOSIDES-DOCUSATE SODIUM 8.6-50 MG PO TABS: 1.0000 | ORAL_TABLET | Freq: Every evening | ORAL | Status: DC | PRN

## 2024-08-05 MED ORDER — POTASSIUM CHLORIDE CRYS ER 20 MEQ PO TBCR
20.0000 meq | EXTENDED_RELEASE_TABLET | Freq: Once | ORAL | Status: AC
  Administered 2024-08-05: 20 meq via ORAL
  Filled 2024-08-05: qty 1

## 2024-08-05 MED ORDER — AMOXICILLIN 500 MG PO CAPS
500.0000 mg | ORAL_CAPSULE | Freq: Two times a day (BID) | ORAL | 0 refills | Status: DC
  Filled 2024-08-05: qty 10, 5d supply, fill #0

## 2024-08-05 NOTE — Plan of Care (Signed)
 Problem: Education: Goal: Ability to demonstrate management of disease process will improve Outcome: Progressing Goal: Ability to verbalize understanding of medication therapies will improve Outcome: Progressing   Problem: Activity: Goal: Capacity to carry out activities will improve Outcome: Progressing   Problem: Cardiac: Goal: Ability to achieve and maintain adequate cardiopulmonary perfusion will improve Outcome: Progressing Note: Patient continues to be hypertensive overnight, resultant headaches   Problem: Education: Goal: Knowledge of General Education information will improve Description: Including pain rating scale, medication(s)/side effects and non-pharmacologic comfort measures Outcome: Progressing   Problem: Health Behavior/Discharge Planning: Goal: Ability to manage health-related needs will improve Outcome: Progressing   Problem: Clinical Measurements: Goal: Ability to maintain clinical measurements within normal limits will improve Outcome: Progressing Goal: Will remain free from infection Outcome: Progressing Goal: Diagnostic test results will improve Outcome: Progressing Goal: Respiratory complications will improve Outcome: Progressing Goal: Cardiovascular complication will be avoided Outcome: Progressing   Problem: Activity: Goal: Risk for activity intolerance will decrease Outcome: Progressing   Problem: Nutrition: Goal: Adequate nutrition will be maintained Outcome: Progressing   Problem: Coping: Goal: Level of anxiety will decrease Outcome: Progressing   Problem: Elimination: Goal: Will not experience complications related to bowel motility Outcome: Progressing Goal: Will not experience complications related to urinary retention Outcome: Progressing   Problem: Pain Managment: Goal: General experience of comfort will improve and/or be controlled Outcome: Progressing   Problem: Safety: Goal: Ability to remain free from injury will  improve Outcome: Progressing   Problem: Skin Integrity: Goal: Risk for impaired skin integrity will decrease Outcome: Progressing   Problem: Education: Goal: Ability to demonstrate management of disease process will improve 08/05/2024 0830 by Rosanne Elspeth HERO, RN Outcome: Progressing 08/05/2024 0829 by Rosanne Elspeth HERO, RN Outcome: Progressing Goal: Ability to verbalize understanding of medication therapies will improve 08/05/2024 0830 by Rosanne Elspeth HERO, RN Outcome: Progressing 08/05/2024 0829 by Rosanne Elspeth HERO, RN Outcome: Progressing   Problem: Activity: Goal: Capacity to carry out activities will improve 08/05/2024 0830 by Rosanne Elspeth HERO, RN Outcome: Progressing 08/05/2024 0829 by Rosanne Elspeth HERO, RN Outcome: Progressing   Problem: Education: Goal: Knowledge of General Education information will improve Description: Including pain rating scale, medication(s)/side effects and non-pharmacologic comfort measures 08/05/2024 0830 by Rosanne Elspeth HERO, RN Outcome: Progressing 08/05/2024 0829 by Rosanne Elspeth HERO, RN Outcome: Progressing   Problem: Health Behavior/Discharge Planning: Goal: Ability to manage health-related needs will improve 08/05/2024 0830 by Rosanne Elspeth HERO, RN Outcome: Progressing 08/05/2024 0829 by Rosanne Elspeth HERO, RN Outcome: Progressing   Problem: Clinical Measurements: Goal: Ability to maintain clinical measurements within normal limits will improve 08/05/2024 0830 by Rosanne Elspeth HERO, RN Outcome: Progressing 08/05/2024 0829 by Rosanne Elspeth HERO, RN Outcome: Progressing Goal: Will remain free from infection 08/05/2024 0830 by Rosanne Elspeth HERO, RN Outcome: Progressing 08/05/2024 0829 by Rosanne Elspeth HERO, RN Outcome: Progressing Goal: Diagnostic test results will improve 08/05/2024 0830 by Rosanne Elspeth HERO, RN Outcome: Progressing 08/05/2024 0829 by Rosanne Elspeth HERO,  RN Outcome: Progressing Goal: Respiratory complications will improve 08/05/2024 0830 by Rosanne Elspeth HERO, RN Outcome: Progressing 08/05/2024 0829 by Rosanne Elspeth HERO, RN Outcome: Progressing Goal: Cardiovascular complication will be avoided 08/05/2024 0830 by Rosanne Elspeth HERO, RN Outcome: Progressing 08/05/2024 0829 by Rosanne Elspeth HERO, RN Outcome: Progressing   Problem: Activity: Goal: Risk for activity intolerance will decrease 08/05/2024 0830 by Rosanne Elspeth HERO, RN Outcome: Progressing 08/05/2024 0829 by Rosanne Elspeth HERO, RN Outcome: Progressing   Problem: Nutrition: Goal: Adequate nutrition will be maintained 08/05/2024  0830 by Rosanne Elspeth HERO, RN Outcome: Progressing 08/05/2024 0829 by Rosanne Elspeth HERO, RN Outcome: Progressing   Problem: Coping: Goal: Level of anxiety will decrease 08/05/2024 0830 by Rosanne Elspeth HERO, RN Outcome: Progressing 08/05/2024 0829 by Rosanne Elspeth HERO, RN Outcome: Progressing   Problem: Elimination: Goal: Will not experience complications related to bowel motility 08/05/2024 0830 by Rosanne Elspeth HERO, RN Outcome: Progressing 08/05/2024 0829 by Rosanne Elspeth HERO, RN Outcome: Progressing Goal: Will not experience complications related to urinary retention 08/05/2024 0830 by Rosanne Elspeth HERO, RN Outcome: Progressing 08/05/2024 0829 by Rosanne Elspeth HERO, RN Outcome: Progressing   Problem: Pain Managment: Goal: General experience of comfort will improve and/or be controlled 08/05/2024 0830 by Rosanne Elspeth HERO, RN Outcome: Progressing 08/05/2024 0829 by Rosanne Elspeth HERO, RN Outcome: Progressing   Problem: Safety: Goal: Ability to remain free from injury will improve 08/05/2024 0830 by Rosanne Elspeth HERO, RN Outcome: Progressing 08/05/2024 0829 by Rosanne Elspeth HERO, RN Outcome: Progressing   Problem: Skin Integrity: Goal: Risk for impaired skin integrity will  decrease 08/05/2024 0830 by Rosanne Elspeth HERO, RN Outcome: Progressing 08/05/2024 0829 by Rosanne Elspeth HERO, RN Outcome: Progressing   Problem: Cardiac: Goal: Ability to achieve and maintain adequate cardiopulmonary perfusion will improve 08/05/2024 0830 by Rosanne Elspeth HERO, RN Outcome: Not Progressing Note: Patient continues to be hypertensive overnight, resultant headaches 08/05/2024 0829 by Rosanne Elspeth HERO, RN Outcome: Progressing Note: Patient continues to be hypertensive overnight, resultant headaches

## 2024-08-05 NOTE — TOC Transition Note (Signed)
 Transition of Care [facility]) - Discharge Note   Patient Details  Name: [name] MRN: [id number] Date of Birth: 11-03-1945  Transition of Care [facility]) CM/SW Contact:  Bascom Service, RN Phone Number: 08/05/2024, 11:45 AM   Clinical Narrative: d/c home w/HHC HHPT/OT-[facility] already set up. Has own transport. No further CM needs.      Final next level of care: Home w Home Health Services Barriers to Discharge: No Barriers Identified   Patient Goals and CMS Choice Patient states their goals for this hospitalization and ongoing recovery are:: Home CMS Medicare.gov Compare Post Acute Care list provided to:: Patient Choice offered to / list presented to : Patient [facility] ownership interest in [facility].provided to:: Patient    Discharge Placement                       Discharge Plan and Services Additional resources added to the After Visit Summary for     Discharge Planning Services: CM Consult Post Acute Care Choice: Home Health                    HH Arranged: PT, OT [facility] Agency: [facility] Date [facility] Agency Contacted: 08/05/24 Time HH Agency Contacted: 1145 Representative spoke with at [facility] Agency: [place]  Social Drivers of Health (SDOH) Interventions SDOH Screenings   Food Insecurity: No Food Insecurity (07/29/2024)  Housing: Low Risk  (07/29/2024)  Transportation Needs: No Transportation Needs (07/29/2024)  Utilities: Not At Risk (07/29/2024)  Social Connections: Socially Integrated (07/29/2024)  Tobacco Use: Medium Risk (07/29/2024)     Readmission Risk Interventions    07/30/2024   11:30 AM 03/28/2024    1:20 PM 08/03/2023   10:47 AM  Readmission Risk Prevention Plan  Transportation Screening Complete Complete Complete  PCP or Specialist Appt within 3-5 Days Complete Complete Complete  HRI or Home Care Consult Complete Complete Complete  Social Work Consult for Recovery Care Planning/Counseling Complete Complete Complete   Palliative Care Screening Complete Not Applicable Not Applicable  Medication Review Oceanographer) Complete Complete Complete

## 2024-08-05 NOTE — Discharge Summary (Signed)
 Physician Discharge Summary  [name] FMW:[id number] DOB: 1946-04-05 DOA: 07/29/2024  PCP: Chrystal Lamarr RAMAN, MD  Admit date: 07/29/2024 Discharge date: 08/05/2024  Time spent: 60 minutes  Recommendations for Outpatient Follow-up:  Follow-up with Chrystal Lamarr RAMAN, MD in 2 weeks.  On follow-up patient will need a basic metabolic profile done to follow-up on electrolytes and renal function.  CBC will need to be obtained to follow-up on hemoglobin.  Patient's blood pressure will need to be reassessed on follow-up. Follow-up with Dr.Bhandari, nephrology as scheduled in 1 week.  On follow-up patient will need a basic metabolic profile as well as CBC done to follow-up on electrolytes and renal function and further recommendations and renal function will need to be made at that time. Follow-up with [facility] in 1 to 2 weeks.   Discharge Diagnoses:  Principal Problem:   Acute kidney injury superimposed on stage 4 chronic kidney disease (HCC) Active Problems:   Essential hypertension   Mixed hyperlipidemia   Chronic combined systolic and diastolic CHF (congestive heart failure) (HCC)   Urothelial carcinoma (HCC)   Depression   CAD in native artery   HFrEF (heart failure with reduced ejection fraction) (HCC)   AKI (acute kidney injury)   Anemia due to chronic kidney disease   Acute cystitis without hematuria   Discharge Condition: Stable and improved.  Diet recommendation: Heart healthy  Filed Weights   08/03/24 0500 08/04/24 0500 08/05/24 0500  Weight: 88.4 kg 86.6 kg 79.6 kg    History of present illness:  HPI per Dr. Carlota Montie [name] is a 78 y.o. female with medical history significant of HLD, Hx of PE,HTN,CAD non obstructive CAD, OSA not on CPAP, bladder cancer s/p right nephroureterectomy for cancer ,anemia,GERD, CHF pef ef 63%,, hx of PE 2022, CKD IV with progression followed by nephrology with baseline Scr 3.3 as of 03/2024 patient presents to ED   with episodes of  n/v/d x 1 week duration. She also reports fatigue, poor appetite and associated chills. She denies fevers.She was seen by her oncologist today and labs showed Scr of 6.45 repeat at ER was 5.77. She follows closely with Urology and had planned for stent exchange at some point. UA showed many bacteria, large leucocytes and proteinuria. CT abdome/pelvis pending, however discussed with Urology who read the CT imaging and there is no indication for stent exchange for now.   Hospital Course:  #1 AKI on CKD stage IV/metabolic acidosis -Baseline creatinine approximately 3.3 as of 03/2024. - Creatinine on admission as high as 6.45. - Likely secondary to a prerenal azotemia in the setting of nausea vomiting diarrhea/GI losses versus postop secondary to ureteral stent. - CT abdomen and pelvis with indwelling left ureteral stent, mild fat stranding surrounding the left renal pelvis and left ureter consistent with known history of neoplasm and no infection.  Persistent left periaortic lymphadenopathy concerning for metastatic disease.  Distal colonic diverticulosis without diverticulitis.  Prior right nephro ureterectomy.  Aortic atherosclerosis. - Patient noted to be on IV fluids. - Urine sodium of 75, urine creatinine of 45. - Renal function slowly trended down however seems to be plateauing.. -Creatinine down to 4.41 by day of discharge. -Acidosis improved with hydration. -Patient with good urine output during the hospitalization  - IV fluid subsequently saline locked.  -Patient with some complaints of shortness of breath the evening of 08/03/2024 and stated required O2. - Patient diuresed with Lasix  20 mg IV every 12 hours with good urine output, clinical improvement  and resolution of shortness of breath.  -Patient assessed by urology and recommended that ureteral stent can be exchanged sometime in the near future and ideally would not be instrumented on with active infection however may  reconsider should patient decompensate. -Urology followed the patient during the hospitalization. - Outpatient follow-up with primary nephrologist as scheduled in 1 week.    2.  Enterococcus faecalis UTI -Urine cultures with > 100,000 colonies of Enterococcus faecalis sensitive to ampicillin, nitrofurantoin, vancomycin .   - Was on IV Rocephin  and transitioned to IV ampicillin and subsequently transitioned to amoxicillin (08/03/2024) and will be discharged on 5 more days of oral amoxicillin to complete a 10-day course of treatment.   - Outpatient follow-up with PCP and urology.     3.  Normocytic anemia - Patient with no active bleeding. - Likely dilutional effect.   - Hemoglobin stable at 8.7 by day of discharge.   - Outpatient follow-up with PCP and primary nephrologist.    4.  History of bladder cancer status post right nephroureterectomy - Outpatient follow-up with primary oncologist.   5.  Hypertension - Patient maintained on home regimen Imdur , carvedilol .       6.  CAD, nonobstructive - Stable.   - Patient maintained on home regimen aspirin , statin, Imdur , carvedilol .    7.  OSA -As needed O2 at bedtime.   8.  HFpEF -Stable. - Noted to not have been on diuretics prior to admission after review of med rec again today.  -Patient with some complaints of shortness of breath the evening of 08/03/2024 which had resolved by day of discharge.   -Patient states had to go on oxygen the evening of 08/03/2024.   -Patient hydrated on admission due to AKI on CKD. -IV fluids have been saline locked since 08/03/2024. -Patient with a urine output of 1.5 L over the past 24 hours. -Patient placed on Lasix  20 mg IV every 12 hours with good urine output and clinical improvement with resolution of shortness of breath. -Patient with sats of 95 to 98% on room air by day of discharge. -Outpatient follow-up with PCP.   9.  GERD - Patient maintained on home regimen Pepcid .    10.   Hyperlipidemia - Patient maintained on home regimen Crestor  and Vascepa .     11.  Hyperkalemia -Likely secondary to AKI on CKD. - Status post Lokelma .   -Resolved by day of discharge.    Procedures: CT abdomen and pelvis 07/29/2024   Consultations: Urology: Dr. Alvaro 07/29/2024    Discharge Exam: Vitals:   08/05/24 0547 08/05/24 0643  BP: (!) 181/80 (!) 141/70  Pulse: 70 73  Resp: 18   Temp: 98.5 F (36.9 C)   SpO2: 95%     General: NAD Cardiovascular: RRR no murmurs rubs or gallops.  No JVD.  No lower extremity edema. Respiratory: Clear to auscultation bilaterally.  No wheezes, no crackles, no rhonchi.  Fair air movement.  Speaking in full sentences.  Discharge Instructions   Discharge Instructions     Diet - low sodium heart healthy   Complete by: As directed    Increase activity slowly   Complete by: As directed       Allergies as of 08/05/2024       Reactions   Erythromycin Diarrhea, Nausea And Vomiting   Lotensin [benazepril Hcl] Cough   Macrodantin [nitrofurantoin] Diarrhea   And night sweats        Medication List     TAKE these medications  acetaminophen  500 MG tablet Commonly known as: TYLENOL  Take 500-1,000 mg by mouth every 6 (six) hours as needed for mild pain.   amoxicillin 500 MG capsule Commonly known as: AMOXIL Take 1 capsule (500 mg total) by mouth every 12 (twelve) hours for 5 days.   aspirin  EC 81 MG tablet Take 1 tablet (81 mg total) by mouth daily. Swallow whole.   carvedilol  6.25 MG tablet Commonly known as: COREG  TAKE 1 TABLET(6.25 MG) BY MOUTH TWICE DAILY WITH A MEAL   cholecalciferol  25 MCG (1000 UNIT) tablet Commonly known as: VITAMIN D3 Take 1,000 Units by mouth every morning.   HEARTBURN RELIEF PO Take 1 tablet by mouth in the morning and at bedtime.   icosapent  Ethyl 1 g capsule Commonly known as: Vascepa  Take 1 capsule (1 g total) by mouth 2 (two) times daily.   IRON PO Take 1 tablet by mouth in the  morning.   iron polysaccharides 150 MG capsule Commonly known as: NIFEREX Take 1 capsule (150 mg total) by mouth daily. Start taking on: August 06, 2024   isosorbide  mononitrate 30 MG 24 hr tablet Commonly known as: IMDUR  TAKE 1/2 TABLET(15 MG) BY MOUTH DAILY What changed: See the new instructions.   ondansetron  4 MG tablet Commonly known as: ZOFRAN  Take 4 mg by mouth 2 (two) times daily as needed.   rosuvastatin  40 MG tablet Commonly known as: CRESTOR  TAKE 1 TABLET(40 MG) BY MOUTH DAILY What changed: See the new instructions.   senna-docusate 8.6-50 MG tablet Commonly known as: Senokot-S Take 1 tablet by mouth at bedtime as needed for mild constipation.   sertraline  50 MG tablet Commonly known as: ZOLOFT  Take 50 mg by mouth every morning.   traZODone  150 MG tablet Commonly known as: DESYREL  Take 150 mg by mouth at bedtime.       Allergies  Allergen Reactions   Erythromycin Diarrhea and Nausea And Vomiting   Lotensin [Benazepril Hcl] Cough   Macrodantin [Nitrofurantoin] Diarrhea    And night sweats    Contact information for follow-up providers     Chrystal Lamarr RAMAN, MD. Schedule an appointment as soon as possible for a visit in 2 week(s).   Specialty: Family Medicine Contact information: [address] [place] [address] [phone]         Dolan Mateo Larger, MD Follow up.   Specialties: Nephrology, Internal Medicine Why: Follow-up as scheduled next week. Contact information: [address] [place] [address] [phone]         [facility]. Schedule an appointment as soon as possible for a visit in 1 week(s).   Why: Follow-up in 1 to 2 weeks. Contact information: [address] [address] [place]  [address] [phone]             Contact information for after-discharge care     Home Medical Care     [facility]) .   Service: Home Health Services Why:  HHPT/OT Contact information: [address] [place]  [address] [phone]                      The results of significant diagnostics from this hospitalization (including imaging, microbiology, ancillary and laboratory) are listed below for reference.    Significant Diagnostic Studies: CT ABDOMEN PELVIS WO CONTRAST Result Date: 07/29/2024 CLINICAL DATA:  Nausea and vomiting for 1 week, fever, prior right nephrectomy, indwelling left ureteral stent, history of  left ureteral cancer EXAM: CT ABDOMEN AND PELVIS WITHOUT CONTRAST TECHNIQUE: Multidetector CT imaging of the abdomen and pelvis was performed following the standard protocol without IV contrast. RADIATION DOSE REDUCTION: This exam was performed according to the departmental dose-optimization program which includes automated exposure control, adjustment of the mA and/or kV according to patient size and/or use of iterative reconstruction technique. COMPARISON:  03/27/2024 FINDINGS: Lower chest: No acute pleural or parenchymal lung disease. Small hiatal hernia. Hepatobiliary: Unremarkable unenhanced appearance of the liver and gallbladder. No biliary duct dilation. Pancreas: Unremarkable unenhanced appearance. Spleen: Unremarkable unenhanced appearance. Adrenals/Urinary Tract: The adrenals are unremarkable. Prior right nephro ureterectomy with no abnormalities in the nephrectomy bed. There is an indwelling left ureteral stent extending from the left renal pelvis into the bladder lumen. No urinary tract calculi or obstructive uropathy. There is mild fat stranding surrounding the left renal pelvis and left ureter, which could be related to infection or patient's known history of neoplasm. The bladder is unremarkable. Stomach/Bowel: No bowel obstruction or ileus. Normal appendix right lower quadrant. Distal colonic diverticulosis without diverticulitis. No bowel wall thickening or inflammatory change. Small  hiatal hernia. Vascular/Lymphatic: Stable aortic atherosclerosis. Assessment of the vascular lumen is limited without intravenous contrast. Multiple enlarged retroperitoneal lymph nodes are again identified, largest in the left para-aortic region measuring 11 mm in short axis, reference image 45/2, stable. No new adenopathy. Reproductive: Status post hysterectomy. No adnexal masses. Other: No free fluid or free intraperitoneal gas. No abdominal wall hernia. Musculoskeletal: No acute or destructive bony abnormalities. Reconstructed images demonstrate no additional findings. IMPRESSION: 1. Indwelling left ureteral stent as above, with mild fat stranding surrounding the left renal pelvis and left ureter consistent with known history of neoplasm and/or infection. Please correlate with urinalysis. 2. Persistent left para-aortic lymphadenopathy, concerning for metastatic disease. 3. Distal colonic diverticulosis without diverticulitis. 4. Prior right nephro ureterectomy. 5.  Aortic Atherosclerosis (ICD10-I70.0). Electronically Signed   By: Ozell Daring M.D.   On: 07/29/2024 15:30    Microbiology: Recent Results (from the past 240 hours)  Urine Culture     Status: Abnormal   Collection Time: 07/29/24 12:48 PM   Specimen: Urine, Random  Result Value Ref Range Status   Specimen Description   Final    URINE, RANDOM Performed at [facility], [address], [place], [place] [address]    Special Requests   Final    NONE Reflexed from [id number] Performed at [facility], [address], [place], [place] [address]    Culture >=100,000 COLONIES/mL ENTEROCOCCUS FAECALIS (A)  Final   Report Status 07/31/2024 FINAL  Final   Organism ID, Bacteria ENTEROCOCCUS FAECALIS (A)  Final      Susceptibility   Enterococcus faecalis - MIC*    AMPICILLIN <=2 SENSITIVE Sensitive     NITROFURANTOIN <=16 SENSITIVE Sensitive     VANCOMYCIN  1 SENSITIVE Sensitive     * >=100,000 COLONIES/mL  ENTEROCOCCUS FAECALIS     Labs: Basic Metabolic Panel: Recent Labs  Lab 08/01/24 0530 08/02/24 0554 08/03/24 0547 08/04/24 0512 08/05/24 0512  NA 143 142 143 142  144 142  K 5.6* 5.0 4.4 3.8  3.9 3.4*  CL 114* 115* 116* 114*  114* 111  CO2 18* 18* 18* 17*  17* 20*  GLUCOSE 83 85 84 86  86 93  BUN 59* 50* 42* 42*  42* 41*  CREATININE 4.68* 4.64* 4.36* 4.35*  4.29* 4.41*  CALCIUM  8.4* 8.4* 8.2* 8.5*  8.5* 8.8*  PHOS 5.6* 5.4* 4.8* 4.7* 4.4   Liver Function Tests: Recent Labs  Lab 07/30/24 0519 08/01/24 0530 08/02/24 0554 08/03/24 0547 08/04/24 0512 08/05/24 0512  AST 17  --   --   --   --   --   ALT 20  --   --   --   --   --   ALKPHOS 67  --   --   --   --   --   BILITOT 0.2  --   --   --   --   --   PROT 7.1  --   --   --   --   --   ALBUMIN  3.3* 3.0* 3.0* 2.9* 2.9* 3.1*   No results for input(s): LIPASE, AMYLASE in the last 168 hours. No results for input(s): AMMONIA in the last 168 hours. CBC: Recent Labs  Lab 08/01/24 0530 08/02/24 0554 08/03/24 0547 08/04/24 0512 08/05/24 0512  WBC 6.7 6.9 7.1 7.9 8.6  NEUTROABS  --   --   --  4.2 5.6  HGB 8.3* 8.2* 7.9* 7.8* 8.7*  HCT 28.5* 27.9* 25.6* 26.3* 28.0*  MCV 99.3 99.3 98.5 97.4 94.9  PLT 217 197 187 178 185   Cardiac Enzymes: No results for input(s): CKTOTAL, CKMB, CKMBINDEX, TROPONINI in the last 168 hours. BNP: BNP (last 3 results) No results for input(s): BNP in the last 8760 hours.  ProBNP (last 3 results) No results for input(s): PROBNP in the last 8760 hours.  CBG: No results for input(s): GLUCAP in the last 168 hours.     Signed:  Toribio Hummer MD.  [facility] 08/05/2024, 11:39 AM

## 2024-08-05 NOTE — Plan of Care (Signed)
 Problem: Education: Goal: Ability to demonstrate management of disease process will improve 08/05/2024 1203 by Rosanne Elspeth HERO, RN Outcome: Adequate for Discharge 08/05/2024 0830 by Rosanne Elspeth HERO, RN Outcome: Progressing 08/05/2024 0829 by Rosanne Elspeth HERO, RN Outcome: Progressing Goal: Ability to verbalize understanding of medication therapies will improve 08/05/2024 1203 by Rosanne Elspeth HERO, RN Outcome: Adequate for Discharge 08/05/2024 0830 by Rosanne Elspeth HERO, RN Outcome: Progressing 08/05/2024 0829 by Rosanne Elspeth HERO, RN Outcome: Progressing   Problem: Activity: Goal: Capacity to carry out activities will improve 08/05/2024 1203 by Rosanne Elspeth HERO, RN Outcome: Adequate for Discharge 08/05/2024 0830 by Rosanne Elspeth HERO, RN Outcome: Progressing 08/05/2024 0829 by Rosanne Elspeth HERO, RN Outcome: Progressing   Problem: Cardiac: Goal: Ability to achieve and maintain adequate cardiopulmonary perfusion will improve 08/05/2024 1203 by Rosanne Elspeth HERO, RN Outcome: Adequate for Discharge 08/05/2024 0830 by Rosanne Elspeth HERO, RN Outcome: Not Progressing Note: Patient continues to be hypertensive overnight, resultant headaches 08/05/2024 0829 by Rosanne Elspeth HERO, RN Outcome: Progressing Note: Patient continues to be hypertensive overnight, resultant headaches   Problem: Education: Goal: Knowledge of General Education information will improve Description: Including pain rating scale, medication(s)/side effects and non-pharmacologic comfort measures 08/05/2024 1203 by Rosanne Elspeth HERO, RN Outcome: Adequate for Discharge 08/05/2024 0830 by Rosanne Elspeth HERO, RN Outcome: Progressing 08/05/2024 0829 by Rosanne Elspeth HERO, RN Outcome: Progressing   Problem: Health Behavior/Discharge Planning: Goal: Ability to manage health-related needs will improve 08/05/2024 1203 by Rosanne Elspeth HERO, RN Outcome: Adequate for  Discharge 08/05/2024 0830 by Rosanne Elspeth HERO, RN Outcome: Progressing 08/05/2024 0829 by Rosanne Elspeth HERO, RN Outcome: Progressing   Problem: Clinical Measurements: Goal: Ability to maintain clinical measurements within normal limits will improve 08/05/2024 1203 by Rosanne Elspeth HERO, RN Outcome: Adequate for Discharge 08/05/2024 0830 by Rosanne Elspeth HERO, RN Outcome: Progressing 08/05/2024 0829 by Rosanne Elspeth HERO, RN Outcome: Progressing Goal: Will remain free from infection 08/05/2024 1203 by Rosanne Elspeth HERO, RN Outcome: Adequate for Discharge 08/05/2024 0830 by Rosanne Elspeth HERO, RN Outcome: Progressing 08/05/2024 0829 by Rosanne Elspeth HERO, RN Outcome: Progressing Goal: Diagnostic test results will improve 08/05/2024 1203 by Rosanne Elspeth HERO, RN Outcome: Adequate for Discharge 08/05/2024 0830 by Rosanne Elspeth HERO, RN Outcome: Progressing 08/05/2024 0829 by Rosanne Elspeth HERO, RN Outcome: Progressing Goal: Respiratory complications will improve 08/05/2024 1203 by Rosanne Elspeth HERO, RN Outcome: Adequate for Discharge 08/05/2024 0830 by Rosanne Elspeth HERO, RN Outcome: Progressing 08/05/2024 0829 by Rosanne Elspeth HERO, RN Outcome: Progressing Goal: Cardiovascular complication will be avoided 08/05/2024 1203 by Rosanne Elspeth HERO, RN Outcome: Adequate for Discharge 08/05/2024 0830 by Rosanne Elspeth HERO, RN Outcome: Progressing 08/05/2024 0829 by Rosanne Elspeth HERO, RN Outcome: Progressing   Problem: Activity: Goal: Risk for activity intolerance will decrease 08/05/2024 1203 by Rosanne Elspeth HERO, RN Outcome: Adequate for Discharge 08/05/2024 0830 by Rosanne Elspeth HERO, RN Outcome: Progressing 08/05/2024 0829 by Rosanne Elspeth HERO, RN Outcome: Progressing   Problem: Nutrition: Goal: Adequate nutrition will be maintained 08/05/2024 1203 by Rosanne Elspeth HERO, RN Outcome: Adequate for Discharge 08/05/2024  0830 by Rosanne Elspeth HERO, RN Outcome: Progressing 08/05/2024 0829 by Rosanne Elspeth HERO, RN Outcome: Progressing   Problem: Coping: Goal: Level of anxiety will decrease 08/05/2024 1203 by Rosanne Elspeth HERO, RN Outcome: Adequate for Discharge 08/05/2024 0830 by Rosanne Elspeth HERO, RN Outcome: Progressing 08/05/2024 0829 by Rosanne Elspeth HERO, RN Outcome: Progressing   Problem: Elimination: Goal: Will not experience complications related to bowel motility 08/05/2024 1203 by Rosanne,  Elspeth HERO, RN Outcome: Adequate for Discharge 08/05/2024 0830 by Rosanne Elspeth HERO, RN Outcome: Progressing 08/05/2024 0829 by Rosanne Elspeth HERO, RN Outcome: Progressing Goal: Will not experience complications related to urinary retention 08/05/2024 1203 by Rosanne Elspeth HERO, RN Outcome: Adequate for Discharge 08/05/2024 0830 by Rosanne Elspeth HERO, RN Outcome: Progressing 08/05/2024 0829 by Rosanne Elspeth HERO, RN Outcome: Progressing   Problem: Pain Managment: Goal: General experience of comfort will improve and/or be controlled 08/05/2024 1203 by Rosanne Elspeth HERO, RN Outcome: Adequate for Discharge 08/05/2024 0830 by Rosanne Elspeth HERO, RN Outcome: Progressing 08/05/2024 0829 by Rosanne Elspeth HERO, RN Outcome: Progressing   Problem: Safety: Goal: Ability to remain free from injury will improve 08/05/2024 1203 by Rosanne Elspeth HERO, RN Outcome: Adequate for Discharge 08/05/2024 0830 by Rosanne Elspeth HERO, RN Outcome: Progressing 08/05/2024 0829 by Rosanne Elspeth HERO, RN Outcome: Progressing   Problem: Skin Integrity: Goal: Risk for impaired skin integrity will decrease 08/05/2024 1203 by Rosanne Elspeth HERO, RN Outcome: Adequate for Discharge 08/05/2024 0830 by Rosanne Elspeth HERO, RN Outcome: Progressing 08/05/2024 0829 by Rosanne Elspeth HERO, RN Outcome: Progressing

## 2024-08-07 DIAGNOSIS — I700 Atherosclerosis of aorta: Secondary | ICD-10-CM | POA: Diagnosis not present

## 2024-08-07 DIAGNOSIS — I2510 Atherosclerotic heart disease of native coronary artery without angina pectoris: Secondary | ICD-10-CM | POA: Diagnosis not present

## 2024-08-07 DIAGNOSIS — N184 Chronic kidney disease, stage 4 (severe): Secondary | ICD-10-CM | POA: Diagnosis not present

## 2024-08-07 DIAGNOSIS — B952 Enterococcus as the cause of diseases classified elsewhere: Secondary | ICD-10-CM | POA: Diagnosis not present

## 2024-08-07 DIAGNOSIS — D631 Anemia in chronic kidney disease: Secondary | ICD-10-CM | POA: Diagnosis not present

## 2024-08-07 DIAGNOSIS — I5042 Chronic combined systolic (congestive) and diastolic (congestive) heart failure: Secondary | ICD-10-CM | POA: Diagnosis not present

## 2024-08-07 DIAGNOSIS — N179 Acute kidney failure, unspecified: Secondary | ICD-10-CM | POA: Diagnosis not present

## 2024-08-07 DIAGNOSIS — N3000 Acute cystitis without hematuria: Secondary | ICD-10-CM | POA: Diagnosis not present

## 2024-08-08 ENCOUNTER — Ambulatory Visit: Attending: Cardiovascular Disease | Admitting: Nurse Practitioner

## 2024-08-08 ENCOUNTER — Encounter: Payer: Self-pay | Admitting: Nurse Practitioner

## 2024-08-08 DIAGNOSIS — Z01810 Encounter for preprocedural cardiovascular examination: Secondary | ICD-10-CM | POA: Diagnosis not present

## 2024-08-08 NOTE — Progress Notes (Signed)
 Virtual Visit via Telephone Note   Because of JKAYLA SPIEWAK co-morbid illnesses, she is at least at moderate risk for complications without adequate follow up.  This format is felt to be most appropriate for this patient at this time.  Due to technical limitations with video connection (technology), today's appointment will be conducted as an audio only telehealth visit, and MELINDA GWINNER verbally agreed to proceed in this manner.   All issues noted in this document were discussed and addressed.  No physical exam could be performed with this format.  Evaluation Performed:  Preoperative cardiovascular risk assessment _____________   Date:  08/08/2024   Patient ID:  [name], DOB 06-19-1946, MRN [id number] Patient Location:  Home Provider location:   Office  Primary Care Provider:  Chrystal Lamarr RAMAN, MD Primary Cardiologist:  Stanly DELENA Leavens, MD  Chief Complaint / Patient Profile   78 y.o. y/o female with a h/o moderate coronary artery disease on cath 10/2019 treated medically, aortic atherosclerosis, CKD, mild MR, HLD, HTN, chronic combined systolic and diastolic heart failure with normal LVEF and G1DD on TTE 05/2022 who is pending cystoscopy, left ureteroscopy, possible tumor ablation and presents today for telephonic preoperative cardiovascular risk assessment.  History of Present Illness    [name] is a 78 y.o. female who presents via audio/video conferencing for a telehealth visit today.  Pt was last seen in cardiology clinic on 02/04/24 by Dr. Leavens.  At that time ROBYNNE ROAT was doing well.  The patient is now pending procedure as outlined above. Since her last visit, she denies chest pain, shortness of breath, lower extremity edema, fatigue, palpitations, melena, hematuria, hemoptysis, diaphoresis, weakness, presyncope, syncope, orthopnea, and PND. She is active at home and is able to achieve > 4 METS activity without concerning  cardiac symptoms.    Past Medical History    Past Medical History:  Diagnosis Date   Anemia associated with chronic renal failure    Arthritis    Blood dyscrasia 2008   hx of PE   CAD in native artery 10/2019   cardiologist-   dr christella. leavens;   a. 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   urologist--- dr dahlstedt/  oncologist--- dr amadeo;   dx 09/ 2020 high grade urothelial  s/p laser ablation, ;  recurrent s/p BCG instillation,  completed chemo instilation 11/ 2022  and repeat chemo completed 05/ 2023   Chronic combined systolic and diastolic CHF (congestive heart failure) (HCC) 06/2019   followed by cardiology;   dx 09/ 2022 in setting of sepsis, pulm edema;   05/ 2022  ef 40-45% per echo,  recovered per cath 01/ 2021 ef 50%;  laste echo 08/ 2023  ef 60-65%   CKD (chronic kidney disease), stage III (HCC)    Depression    Dysrhythmia    bradycardia   GAD (generalized anxiety disorder)    GERD (gastroesophageal reflux disease)    History of bladder cancer 12/2006   followed by dr matilda   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   dx 04/ 2008  w/ poor function hydronephrotic  right kidney;   02-06-2007  s/p right nephroureterectomy   History of kidney stones    History of pulmonary embolism 04/2022   in setting severe sepsis;  small RLL, treated w/ 3 months eliquis    Hyperlipidemia, mixed  Hypertension    Solitary kidney, acquired 02/06/2007   s/p  right nephroureterectomy for cancer   Past Surgical History:  Procedure Laterality Date   CATARACT EXTRACTION W/ INTRAOCULAR LENS IMPLANT Bilateral 2011   CYSTOSCOPY W/ RETROGRADES Left 01/30/2024   Procedure: CYSTOSCOPY, WITH RETROGRADE PYELOGRAM;  Surgeon: Alvaro Ricardo KATHEE Mickey., MD;  Location: [facility];  Service: Urology;  Laterality: Left;   CYSTOSCOPY W/ URETERAL STENT PLACEMENT Left 11/17/2019   Procedure: CYSTOSCOPY  WITH STENT REPLACEMENT;  Surgeon: Matilda Senior, MD;  Location: [facility];  Service: Urology;  Laterality: Left;   CYSTOSCOPY W/ URETERAL STENT PLACEMENT Left 03/23/2022   Procedure: CYSTOSCOPY WITH RETROGRADE PYELOGRAM, LEFT URETERAL STENT PLACEMENT;  Surgeon: Matilda Senior, MD;  Location: [facility];  Service: Urology;  Laterality: Left;   CYSTOSCOPY W/ URETERAL STENT PLACEMENT Left 06/09/2022   Procedure: CYSTOSCOPY WITH LEFT STENT EXCHANGE;  Surgeon: Matilda Senior, MD;  Location: [facility];  Service: Urology;  Laterality: Left;   CYSTOSCOPY W/ URETERAL STENT PLACEMENT Left 12/04/2022   Procedure: CYSTOSCOPY WITH STENT REPLACEMENT;  Surgeon: Matilda Senior, MD;  Location: [facility];  Service: Urology;  Laterality: Left;   CYSTOSCOPY W/ URETERAL STENT REMOVAL Left 05/10/2020   Procedure: CYSTOSCOPY WITH STENT REMOVAL;  Surgeon: Matilda Senior, MD;  Location: [facility];  Service: Urology;  Laterality: Left;   CYSTOSCOPY W/ URETERAL STENT REMOVAL Left 05/02/2021   Procedure: CYSTOSCOPY WITH STENT REMOVAL;  Surgeon: Matilda Senior, MD;  Location: [facility];  Service: Urology;  Laterality: Left;   CYSTOSCOPY W/ URETERAL STENT REMOVAL Left 11/16/2022   Procedure: CYSTOSCOPY WITH STENT REMOVAL;  Surgeon: Matilda Senior, MD;  Location: [facility];  Service: Urology;  Laterality: Left;   CYSTOSCOPY WITH RETROGRADE PYELOGRAM, URETEROSCOPY AND STENT PLACEMENT Left 06/27/2019   Procedure: CYSTOSCOPY WITH LEFT RETROGRADE PYELOGRAM, URETEROSCOPY, BIOPSY AND LEFT STENT PLACEMENT;  Surgeon: Watt Rush, MD;  Location: [facility];  Service: Urology;  Laterality: Left;   CYSTOSCOPY WITH RETROGRADE PYELOGRAM, URETEROSCOPY AND STENT PLACEMENT Left 08/18/2019   Procedure: CYSTOSCOPY, URETEROSCOPY AND STENT EXCHANGE;  Surgeon: Matilda Senior, MD;  Location: [facility];  Service: Urology;  Laterality:  Left;  90 MINS   CYSTOSCOPY WITH RETROGRADE PYELOGRAM, URETEROSCOPY AND STENT PLACEMENT Left 11/17/2019   Procedure: CYSTOSCOPY WITH RETROGRADE PYELOGRAM, URETEROSCOPY AND STENT PLACEMENT;  Surgeon: Matilda Senior, MD;  Location: [facility];  Service: Urology;  Laterality: Left;  90 MINS   CYSTOSCOPY WITH RETROGRADE PYELOGRAM, URETEROSCOPY AND STENT PLACEMENT Left 05/10/2020   Procedure: CYSTOSCOPY WITH RETROGRADE PYELOGRAM, URETEROSCOPY AND STENT PLACEMENT WITH URETHRAL DIALATION AND BRUSH BIOPSY;  Surgeon: Matilda Senior, MD;  Location: [facility];  Service: Urology;  Laterality: Left;  1 HR   CYSTOSCOPY WITH RETROGRADE PYELOGRAM, URETEROSCOPY AND STENT PLACEMENT Left 09/16/2020   Procedure: CYSTOSCOPY WITH RETROGRADE PYELOGRAM, URETEROSCOPY ,  LITHOPAXY, LEFT URETERAL BRUSHING, AND STENT REPLACEMENT;  Surgeon: Matilda Senior, MD;  Location: [facility];  Service: Urology;  Laterality: Left;   CYSTOSCOPY WITH RETROGRADE PYELOGRAM, URETEROSCOPY AND STENT PLACEMENT Left 10/24/2021   Procedure: CYSTOSCOPY WITH RETROGRADE PYELOGRAM, URETEROSCOPY, POSSIBLE URETERAL AND RENAL BIOPSIES AND STENT PLACEMENT;  Surgeon: Matilda Senior, MD;  [facility];  Service: Urology;  Laterality: Left;  1 HR   CYSTOSCOPY WITH RETROGRADE PYELOGRAM, URETEROSCOPY AND STENT PLACEMENT Left 03/09/2022   Procedure: CYSTOSCOPY WITH ANTEGRADE PYELOGRAM,  STENT REMOVAL;  Surgeon: Matilda Senior, MD;  Location: [facility];  Service: Urology;  Laterality: Left;   CYSTOSCOPY WITH RETROGRADE PYELOGRAM, URETEROSCOPY AND STENT PLACEMENT  01/09/2007   @[facility]  by dr matilda;    bx's/ washing  bladder , right ureter   CYSTOSCOPY WITH RETROGRADE PYELOGRAM, URETEROSCOPY AND STENT PLACEMENT Left 11/16/2022   Procedure: CYSTOSCOPY WITH RETROGRADE PYELOGRAM, URETEROSCOPY AND STENT REPLACEMENT;  Surgeon: Matilda Senior, MD;  Location: [facility];  Service: Urology;  Laterality: Left;  90 MINS   CYSTOSCOPY WITH RETROGRADE PYELOGRAM, URETEROSCOPY AND STENT PLACEMENT Left 07/06/2023   Procedure: CYSTOSCOPY WITH LEFT RETROGRADE PYELOGRAM, URETEROSCOPY, LASER ABLATION OF TUMOR AND STENT EXCHANGE;  Surgeon: Alvaro Ricardo KATHEE Mickey., MD;  Location: [facility];  Service: Urology;  Laterality: Left;  60 MINUTES NEEDED FOR CASE   CYSTOSCOPY/URETEROSCOPY/HOLMIUM LASER Left 12/04/2022   Procedure: CYSTOSCOPY/URETEROSCOPY/  HOLMIUM LASER OF RENAL PELVIC LESIONS;  Surgeon: Matilda Senior, MD;  Location: [facility];  Service: Urology;  Laterality: Left;   CYSTOSCOPY/URETEROSCOPY/HOLMIUM LASER/STENT PLACEMENT Left 05/02/2021   Procedure: CYSTOSCOPY/URETEROSCOPY WITH BRUSH BIOPSY/ RETROGRADE PYELOGRAM/ HOLMIUM LASER/STENT REPLACEMENT;  Surgeon: Matilda Senior, MD;  Location: [facility];  Service: Urology;  Laterality: Left;   CYSTOSCOPY/URETEROSCOPY/HOLMIUM LASER/STENT PLACEMENT Left 01/30/2024   Procedure: CYSTOSCOPY/DIAGNOSTIC URETEROSCOPY/ RETROGRADE/STENT EXCHANGE;  Surgeon: Alvaro Ricardo KATHEE Mickey., MD;  Location: [facility];  Service: Urology;  Laterality: Left;   HOLMIUM LASER APPLICATION Left 09/16/2020   Procedure: HOLMIUM LASER APPLICATION;  Surgeon: Matilda Senior, MD;  Location: [facility];  Service: Urology;  Laterality: Left;   HOLMIUM LASER APPLICATION Left 10/24/2021   Procedure: HOLMIUM LASER APPLICATION OF TUMORS;  Surgeon: Matilda Senior, MD;  Location: [facility];  Service: Urology;  Laterality: Left;   HOLMIUM LASER APPLICATION Left 11/16/2022   Procedure: HOLMIUM LASER OF TUMORS;  Surgeon: Matilda Senior, MD;  Location: [facility];  Service: Urology;  Laterality: Left;   IR NEPHROSTOMY PLACEMENT LEFT  07/06/2021   IR NEPHROSTOMY PLACEMENT LEFT  02/06/2022   NEPHROSTOMY TUBE REMOVAL Left 03/23/2022   Procedure: NEPHROSTOMY TUBE REMOVAL;   Surgeon: Matilda Senior, MD;  Location: [facility];  Service: Urology;  Laterality: Left;   NEPHROURETERECTOMY Right 02/06/2007   @[facility]  by dr matilda;   Laparoscopic   RIGHT/LEFT HEART CATH AND CORONARY ANGIOGRAPHY N/A 11/06/2019   Procedure: RIGHT/LEFT HEART CATH AND CORONARY ANGIOGRAPHY;  Surgeon: Claudene Victory ORN, MD;  Location: [facility];  Service: Cardiovascular;  Laterality: N/A;   THULIUM LASER TURP (TRANSURETHRAL RESECTION OF PROSTATE) Left 08/18/2019   Procedure: THULIUM LASER ABLATION OF URETERAL TUMOR;  Surgeon: Matilda Senior, MD;  Location: [facility];  Service: Urology;  Laterality: Left;   THULIUM LASER TURP (TRANSURETHRAL RESECTION OF PROSTATE) Left 11/17/2019   Procedure: THULIUM LASER of URETERAL CANCER;  Surgeon: Matilda Senior, MD;  Location: [facility];  Service: Urology;  Laterality: Left;   TRANSURETHRAL RESECTION OF BLADDER TUMOR  12/12/2006   @[facility]  by dr andra   VAGINAL HYSTERECTOMY  1980    Allergies  Allergies  Allergen Reactions   Erythromycin Diarrhea and Nausea And Vomiting   Lotensin [Benazepril Hcl] Cough   Macrodantin [Nitrofurantoin] Diarrhea    And night sweats    Home Medications    Prior to Admission medications   Medication Sig Start Date End Date Taking? Authorizing Provider  acetaminophen  (TYLENOL ) 500 MG tablet Take 500-1,000 mg by mouth every 6 (six) hours as needed for mild pain.    [provider]  amoxicillin (AMOXIL) 500 MG  capsule Take 1 capsule (500 mg total) by mouth every 12 (twelve) hours for 5 days. 08/05/24 08/10/24  Sebastian Toribio GAILS, MD  aspirin  EC 81 MG tablet Take 1 tablet (81 mg total) by mouth daily. Swallow whole. 01/09/23   Parthenia Olivia HERO, PA-C  carvedilol  (COREG ) 6.25 MG tablet TAKE 1 TABLET(6.25 MG) BY MOUTH TWICE DAILY WITH A MEAL 02/04/24   Chandrasekhar, Mahesh A, MD  cholecalciferol  (VITAMIN D3) 25 MCG (1000 UNIT) tablet Take 1,000 Units by mouth every morning.     [provider]  Famotidine  (HEARTBURN RELIEF PO) Take 1 tablet by mouth in the morning and at bedtime.    [provider]  Ferrous Sulfate  (IRON PO) Take 1 tablet by mouth in the morning.    [provider]  icosapent  Ethyl (VASCEPA ) 1 g capsule Take 1 capsule (1 g total) by mouth 2 (two) times daily. 04/09/24   Santo Stanly LABOR, MD  iron polysaccharides (NIFEREX) 150 MG capsule Take 1 capsule (150 mg total) by mouth daily. 08/06/24   Sebastian Toribio GAILS, MD  isosorbide  mononitrate (IMDUR ) 30 MG 24 hr tablet TAKE 1/2 TABLET(15 MG) BY MOUTH DAILY Patient taking differently: Take 15 mg by mouth daily. 03/06/24   Chandrasekhar, Stanly LABOR, MD  ondansetron  (ZOFRAN ) 4 MG tablet Take 4 mg by mouth 2 (two) times daily as needed.    [provider]  rosuvastatin  (CRESTOR ) 40 MG tablet TAKE 1 TABLET(40 MG) BY MOUTH DAILY Patient taking differently: Take 40 mg by mouth daily. 04/29/24   Chandrasekhar, Mahesh A, MD  senna-docusate (SENOKOT-S) 8.6-50 MG tablet Take 1 tablet by mouth at bedtime as needed for mild constipation. 08/05/24   Sebastian Toribio GAILS, MD  sertraline  (ZOLOFT ) 50 MG tablet Take 50 mg by mouth every morning.    [provider]  traZODone  (DESYREL ) 150 MG tablet Take 150 mg by mouth at bedtime. 08/24/20   [provider]    Physical Exam    Vital Signs:  ANGELIYAH KIRKEY does not have vital signs available for review today.  Given telephonic nature of communication, physical exam is limited. AAOx3. NAD. Normal affect.  Speech and respirations are unlabored.  Accessory Clinical Findings    None  Assessment & Plan    1.  Preoperative Cardiovascular Risk Assessment: According to the Revised Cardiac Risk Index (RCRI), her Perioperative Risk of Major Cardiac Event is (%): 0.9. Her Functional Capacity in METs is: 6.05 according to the [facility] Activity Status Index (DASI).  The patient was advised that if she develops new symptoms  prior to surgery to contact our office to arrange for a follow-up visit, and she verbalized understanding.  Per office protocol, if patient is without any new symptoms or concerns at the time of their virtual visit, she may hold ASA for 7 days prior to procedure. Please resume ASA as soon as possible postprocedure, at the discretion of the surgeon.   A copy of this note will be routed to requesting surgeon.  Time:   Today, I have spent 10 minutes with the patient with telehealth technology discussing medical history, symptoms, and management plan.     Rosaline EMERSON Bane, NP-C  08/08/2024, 9:17 AM [address] [place], [place] [address] Office [phone] Fax [phone]

## 2024-08-12 NOTE — Progress Notes (Signed)
 The patient was identified using 2 approved identifiers. All issues noted in this document were discussed and addressed, [name]           voiced understanding and agreement with all preoperative instructions. The patient was emailed the surgery instructions per his / her request.     The patient was instructed to call our Pharmacy [phone]) and the Admitting Office [phone] or [phone]) to complete their Pre-surgical Interview.   COVID Vaccine received:  [x]  No []  Yes Date of any COVID positive Test in last 90 days:  None   PCP - Lamarr Rotunda, MD at [facility] [phone]  Cardiologist - Stanly Leavens, MD , Rosaline Bane, NP, Preop clearance in 08-08-2024 Epic note Oncologist- Emaline Saran, MD  Nephrology- Mateo Romney, MD    Chest x-ray - 08-08-23  1v and 08-06-23  2v  epic EKG - 08-03-2023  Epic  Stress Test -06-30-2019  Epic  ECHO - 06-01-22 Epic Cardiac Cath - 11-06-2019  [facility] by Dr. Victory Sharps,   Nonobstructive CAD   Bowel Prep - [x]  No  []   Yes ______   Pacemaker / ICD device [x]  No []  Yes   Spinal Cord Stimulator:[x]  No []  Yes       History of Sleep Apnea? [x]  No []  Yes   CPAP used?- [x]  No []  Yes     Does the patient monitor blood sugar?   [x]  N/A   []  No []  Yes  Patient has: [x]  NO Hx DM   []  Pre-DM   []  DM1  []   DM2   Blood Thinner / Instructions:  none Aspirin  Instructions:  ASA 81 mg  hold 5-7 days  stopped on 08-07-24 ERAS Protocol Ordered: [x]  No  []  Yes Patient is to be NPO after: midnight prior     Activity level: Can NOT go up a flight of stairs and perform activities of daily living without stopping and without symptoms of chest pain or shortness of breath.   Anesthesia review: HTN, CAD- R/LHC 11-06-2019, HFrEF, hx PE- (Eliquis  x 3 months), severe anemia, CKD4-5- only has 1 kidney d/t right nephroureterectomy for cancer 2008, Dr. Romney wants patient to start Dialysis at this hospitalization, (She will contact Dr. Alvaro  and let him be aware)  GAD   Patient denies shortness of breath, fever, cough and chest pain at PAT appointment   Patient verbalized understanding of instructions that were given to them at the PAT appointment. Patient was also instructed that they will need to review over the PAT instructions again at home before surgery.

## 2024-08-12 NOTE — Patient Instructions (Addendum)
 SURGICAL WAITING ROOM VISITATION Patients having surgery or a procedure may have no more than 2 support people in the waiting area - these visitors may rotate in the visitor waiting room.   If the patient needs to stay at the hospital during part of their recovery, the visitor guidelines for inpatient rooms apply.   PRE-OP VISITATION  Pre-op nurse will coordinate an appropriate time for 1 support person to accompany the patient in pre-op.  This support person may not rotate.  This visitor will be contacted when the time is appropriate for the visitor to come back in the pre-op area.   Please refer to the [facility] website for the visitor guidelines for Inpatients (after your surgery is over and you are in a regular room).   You are not required to quarantine at this time prior to your surgery. However, you must do this: Hand Hygiene often Do NOT share personal items Notify your provider if you are in close contact with someone who has COVID or you develop fever 100.4 or greater, new onset of sneezing, cough, sore throat, shortness of breath or body aches.  If you test positive for Covid or have been in contact with anyone that has tested positive in the last 10 days please notify you surgeon.     Your procedure is scheduled on: Thursday  August 14, 2024   Report to [facility] Main Entrance: [address] entrance where the [employer] is available.    Report to admitting at: 10:30 AM   Call this number if you have any questions or problems the morning of surgery [phone]   DO NOT EAT OR DRINK ANYTHING AFTER MIDNIGHT THE NIGHT PRIOR TO YOUR SURGERY / PROCEDURE.    FOLLOW  ANY ADDITIONAL PRE OP INSTRUCTIONS YOU RECEIVED FROM YOUR SURGEON'S OFFICE!!!    Oral Hygiene is also important to reduce your risk of infection.        Remember - BRUSH YOUR TEETH THE MORNING OF SURGERY WITH YOUR REGULAR TOOTHPASTE   Do NOT smoke after Midnight the night before surgery.   STOP TAKING  all Vitamins, Herbs and supplements 1 week before your surgery.     ASPIRIN - also stop taking 1 week before surgery.   Take ONLY these medicines the morning of surgery with A SIP OF WATER : carvedilol , sertraline , Isosorbide , and you may take Tylenol  if needed for pain.   You may not have any metal on your body including hair pins, jewelry, and body piercing   Do not wear make-up, lotions, powders, perfumes or deodorant   Do not wear nail polish including gel and S&S, artificial / acrylic nails, or any other type of covering on natural nails including finger and toenails. If you have artificial nails, gel coating, etc., that needs to be removed by a nail salon, Please have this removed prior to surgery. Not doing so may mean that your surgery could be cancelled or delayed if the Surgeon or anesthesia staff feels like they are unable to monitor you safely.    Do not shave 48 hours prior to surgery to avoid nicks in your skin which may contribute to postoperative infections.    Contacts, Hearing Aids, dentures or bridgework may not be worn into surgery. DENTURES WILL BE REMOVED PRIOR TO SURGERY PLEASE DO NOT APPLY Poly grip OR ADHESIVES!!!     Patients discharged on the day of surgery will not be allowed to drive home.  Someone NEEDS to stay with you for the  first 24 hours after anesthesia.   Do not bring your home medications to the hospital. The Pharmacy will dispense medications listed on your medication list to you during your admission in the Hospital.   Special Instructions: Bring a copy of your healthcare power of attorney and living will documents the day of surgery, if you wish to have them scanned into your [facility] Records- EPIC   Please read over the following fact sheets you were given: IF YOU HAVE QUESTIONS ABOUT YOUR PRE-OP INSTRUCTIONS, PLEASE CALL [phone]  SHAWNEE, RN.     [place] - Preparing for Surgery Before surgery, you can play an important role.  Because  skin is not sterile, your skin needs to be as free of germs as possible.  You can reduce the number of germs on your skin by washing with CHG (chlorahexidine gluconate) soap before surgery.  CHG is an antiseptic cleaner which kills germs and bonds with the skin to continue killing germs even after washing. Please DO NOT use if you have an allergy to CHG or antibacterial soaps.  If your skin becomes reddened/irritated stop using the CHG and inform your nurse when you arrive at Short Stay. Do not shave (including legs and underarms) for at least 48 hours prior to the first CHG shower.  You may shave your face/neck.   Please follow these instructions carefully:             1.  Shower with CHG Soap the night before surgery and the  morning of surgery.             2.  If you choose to wash your hair, wash your hair first as usual with your normal  shampoo.             3.  After you shampoo, rinse your hair and body thoroughly to remove the shampoo.                                           4.  Use CHG as you would any other liquid soap.  You can apply chg directly to the skin and wash.  Gently with a scrungie or clean washcloth.             5.  Apply the CHG Soap to your body ONLY FROM THE NECK DOWN.   Do not use on face/ open                           Wound or open sores. Avoid contact with eyes, ears mouth and genitals (private parts).                       Wash face,  Genitals (private parts) with your normal soap.             6.  Wash thoroughly, paying special attention to the area where your             surgery  will be performed.             7.  Thoroughly rinse your body with warm water  from the neck down.             8.  DO NOT shower/wash with your normal soap after using and rinsing off the CHG Soap.  9.  Pat yourself dry with a clean towel.            10.  Wear clean pajamas.            11.  Place clean sheets on your bed the night of your first shower and do not  sleep with pets.    ON THE DAY OF SURGERY : Do not apply any lotions/deodorants the morning of surgery.  Please wear clean clothes to the hospital/surgery center.       FAILURE TO FOLLOW THESE INSTRUCTIONS MAY RESULT IN THE CANCELLATION OF YOUR SURGERY   PATIENT SIGNATURE_________________________________   NURSE SIGNATURE__________________________________   ________________________________________________________________________

## 2024-08-13 ENCOUNTER — Encounter (HOSPITAL_COMMUNITY)
Admission: RE | Admit: 2024-08-13 | Discharge: 2024-08-13 | Disposition: A | Discharge status: Home / Self Care | Source: Ambulatory Visit | Attending: Urology | Admitting: Urology

## 2024-08-13 ENCOUNTER — Encounter (HOSPITAL_COMMUNITY): Payer: Self-pay

## 2024-08-13 DIAGNOSIS — D631 Anemia in chronic kidney disease: Secondary | ICD-10-CM | POA: Diagnosis not present

## 2024-08-13 DIAGNOSIS — I129 Hypertensive chronic kidney disease with stage 1 through stage 4 chronic kidney disease, or unspecified chronic kidney disease: Secondary | ICD-10-CM | POA: Diagnosis not present

## 2024-08-13 DIAGNOSIS — N179 Acute kidney failure, unspecified: Secondary | ICD-10-CM | POA: Diagnosis not present

## 2024-08-13 DIAGNOSIS — C689 Malignant neoplasm of urinary organ, unspecified: Secondary | ICD-10-CM | POA: Diagnosis not present

## 2024-08-13 DIAGNOSIS — E872 Acidosis, unspecified: Secondary | ICD-10-CM | POA: Diagnosis not present

## 2024-08-13 DIAGNOSIS — N2581 Secondary hyperparathyroidism of renal origin: Secondary | ICD-10-CM | POA: Diagnosis not present

## 2024-08-13 DIAGNOSIS — N184 Chronic kidney disease, stage 4 (severe): Secondary | ICD-10-CM | POA: Diagnosis not present

## 2024-08-13 NOTE — Progress Notes (Signed)
 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 former smoker with h/o HTN, PE, moderate coronary artery disease on cath 10/2019 treated medically, CHF, CKD Stage IV 1 kidney d/t right nephroureterectomy for cancer 2008, kidney cancer scheduled for above procedure 08/14/2024 with Dr. Ricardo Alvaro.   Per cardiology preoperative evaluation 08/08/2024, Preoperative Cardiovascular Risk Assessment: According to the Revised Cardiac Risk Index (RCRI), her Perioperative Risk of Major Cardiac Event is (%): 0.9. Her Functional Capacity in METs is: 6.05 according to the [employer] Activity Status Index (DASI).   The patient was advised that if she develops new symptoms prior to surgery to contact our office to arrange for a follow-up visit, and she verbalized understanding.   Per office protocol, if patient is without any new symptoms or concerns at the time of their virtual visit, she may hold ASA for 7 days prior to procedure. Please resume ASA as soon as possible postprocedure, at the discretion of the surgeon.  Pt not seen in PAT clinic, evaluate DOS. Labs DOS.   VS: There were no vitals taken for this visit.  PROVIDERS: Chrystal Lamarr RAMAN, MD is PCP    Primary Cardiologist:  Stanly DELENA Leavens, MD  LABS: labs DOS (all labs ordered are listed, but only abnormal results are displayed)  Labs Reviewed - No data to display   IMAGES:   EKG:   CV: Echo 06/01/2022 1. Left ventricular ejection fraction, by estimation, is 60 to 65%. Left  ventricular ejection fraction by  3D volume is 63 %. The left ventricle has  normal function. The left ventricle has no regional wall motion  abnormalities. Left ventricular diastolic   parameters are consistent with Grade I diastolic dysfunction (impaired  relaxation).   2. Right ventricular systolic function is normal. The right ventricular  size is normal. There is normal pulmonary artery systolic pressure. The  estimated right ventricular systolic pressure is 35.5 mmHg.   3. The mitral valve is grossly normal. Mild mitral valve regurgitation.   4. The aortic valve is tricuspid. There is mild calcification of the  aortic valve. There is mild thickening of the aortic valve. Aortic valve  regurgitation is not visualized. Aortic valve sclerosis/calcification is  present, without any evidence of  aortic stenosis.   5. There is Severe (Grade IV) plaque involving the ascending aorta.   6. The inferior vena cava is normal in size with <50% respiratory  variability, suggesting right atrial pressure of 8 mmHg.    Past Medical History:  Diagnosis Date   Anemia associated with chronic renal failure    Arthritis    Blood dyscrasia 2008   hx of PE   CAD in native artery 10/2019   cardiologist-   dr christella. leavens;   a. 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 [facility]) 06/2019   urologist--- dr dahlstedt/  oncologist--- dr amadeo;   dx 09/ 2020 high grade urothelial  s/p laser ablation, ;  recurrent s/p  BCG instillation,  completed chemo instilation 11/ 2022  and repeat chemo completed 05/ 2023   Chronic combined systolic and diastolic CHF (congestive heart failure) (HCC) 06/2019   followed by cardiology;   dx 09/ 2022 in setting of sepsis, pulm edema;   05/ 2022  ef 40-45% per echo,  recovered per cath 01/ 2021 ef 50%;  laste echo 08/ 2023  ef 60-65%   CKD (chronic kidney disease), stage III (HCC)    Depression    Dysrhythmia     bradycardia   GAD (generalized anxiety disorder)    GERD (gastroesophageal reflux disease)    History of bladder cancer 12/2006   followed by dr matilda   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   dx 04/ 2008  w/ poor function hydronephrotic  right kidney;   02-06-2007  s/p right nephroureterectomy   History of kidney stones    History of pulmonary embolism 04/2022   in setting severe sepsis;  small RLL, treated w/ 3 months eliquis    Hyperlipidemia, mixed    Hypertension    Solitary kidney, acquired 02/06/2007   s/p  right nephroureterectomy for cancer    Past Surgical History:  Procedure Laterality Date   CATARACT EXTRACTION W/ INTRAOCULAR LENS IMPLANT Bilateral 2011   CYSTOSCOPY W/ RETROGRADES Left 01/30/2024   Procedure: CYSTOSCOPY, WITH RETROGRADE PYELOGRAM;  Surgeon: Alvaro Ricardo KATHEE Mickey., MD;  Location: [facility];  Service: Urology;  Laterality: Left;   CYSTOSCOPY W/ URETERAL STENT PLACEMENT Left 11/17/2019   Procedure: CYSTOSCOPY WITH STENT REPLACEMENT;  Surgeon: Matilda Senior, MD;  Location: [facility];  Service: Urology;  Laterality: Left;   CYSTOSCOPY W/ URETERAL STENT PLACEMENT Left 03/23/2022   Procedure: CYSTOSCOPY WITH RETROGRADE PYELOGRAM, LEFT URETERAL STENT PLACEMENT;  Surgeon: Matilda Senior, MD;  Location: [facility];  Service: Urology;  Laterality: Left;   CYSTOSCOPY W/ URETERAL STENT PLACEMENT Left 06/09/2022   Procedure: CYSTOSCOPY WITH LEFT STENT EXCHANGE;  Surgeon: Matilda Senior, MD;  Location[facility];  Service: Urology;  Laterality: Left;   CYSTOSCOPY W/ URETERAL STENT PLACEMENT Left 12/04/2022   Procedure: CYSTOSCOPY WITH STENT REPLACEMENT;  Surgeon: Matilda Senior, MD;  Location: [facility];  Service: Urology;  Laterality: Left;   CYSTOSCOPY W/ URETERAL STENT REMOVAL Left 05/10/2020   Procedure: CYSTOSCOPY WITH STENT REMOVAL;  Surgeon: Matilda Senior, MD;  Location: [facility];   Service: Urology;  Laterality: Left;   CYSTOSCOPY W/ URETERAL STENT REMOVAL Left 05/02/2021   Procedure: CYSTOSCOPY WITH STENT REMOVAL;  Surgeon: Matilda Senior, MD;  Location: [facility];  Service: Urology;  Laterality: Left;   CYSTOSCOPY W/ URETERAL STENT REMOVAL Left 11/16/2022   Procedure: CYSTOSCOPY WITH STENT REMOVAL;  Surgeon: Matilda Senior, MD;  Location: [facility];  Service: Urology;  Laterality: Left;   CYSTOSCOPY WITH RETROGRADE PYELOGRAM, URETEROSCOPY AND STENT PLACEMENT Left 06/27/2019   Procedure: CYSTOSCOPY WITH LEFT RETROGRADE PYELOGRAM, URETEROSCOPY, BIOPSY AND LEFT STENT PLACEMENT;  Surgeon: Watt Rush, MD;  Location[facility];  Service: Urology;  Laterality: Left;   CYSTOSCOPY WITH RETROGRADE PYELOGRAM, URETEROSCOPY AND STENT PLACEMENT Left 08/18/2019   Procedure: CYSTOSCOPY, URETEROSCOPY AND STENT EXCHANGE;  Surgeon: Matilda Senior, MD;  [facility];  Service: Urology;  Laterality: Left;  90 MINS   CYSTOSCOPY WITH RETROGRADE PYELOGRAM, URETEROSCOPY AND STENT PLACEMENT Left 11/17/2019   Procedure: CYSTOSCOPY WITH RETROGRADE PYELOGRAM, URETEROSCOPY AND STENT PLACEMENT;  Surgeon: Matilda Senior, MD;  Location: [facility];  Service: Urology;  Laterality: Left;  90 MINS   CYSTOSCOPY WITH RETROGRADE PYELOGRAM, URETEROSCOPY AND STENT PLACEMENT Left 05/10/2020   Procedure: CYSTOSCOPY WITH RETROGRADE PYELOGRAM, URETEROSCOPY AND STENT PLACEMENT WITH URETHRAL DIALATION AND BRUSH BIOPSY;  Surgeon: Matilda Senior, MD;  Location: [facility];  Service: Urology;  Laterality: Left;  1 HR   CYSTOSCOPY WITH RETROGRADE PYELOGRAM, URETEROSCOPY AND STENT PLACEMENT Left 09/16/2020   Procedure: CYSTOSCOPY WITH RETROGRADE PYELOGRAM, URETEROSCOPY ,  LITHOPAXY, LEFT URETERAL BRUSHING, AND STENT REPLACEMENT;  Surgeon: Matilda Senior, MD;  Location: [facility];  Service: Urology;  Laterality: Left;    CYSTOSCOPY WITH RETROGRADE PYELOGRAM, URETEROSCOPY AND STENT PLACEMENT Left 10/24/2021   Procedure: CYSTOSCOPY WITH RETROGRADE PYELOGRAM, URETEROSCOPY, POSSIBLE URETERAL AND RENAL BIOPSIES AND STENT PLACEMENT;  Surgeon: Matilda Senior, MD;  Location: [facility];  Service: Urology;  Laterality: Left;  1 HR   CYSTOSCOPY WITH RETROGRADE PYELOGRAM, URETEROSCOPY AND STENT PLACEMENT Left 03/09/2022   Procedure: CYSTOSCOPY WITH ANTEGRADE PYELOGRAM,  STENT REMOVAL;  Surgeon: Matilda Senior, MD;  Location: [facility];  Service: Urology;  Laterality: Left;   CYSTOSCOPY WITH RETROGRADE PYELOGRAM, URETEROSCOPY AND STENT PLACEMENT  01/09/2007   @[facility]  by dr matilda;    bx's/ washing  bladder , right ureter   CYSTOSCOPY WITH RETROGRADE PYELOGRAM, URETEROSCOPY AND STENT PLACEMENT Left 11/16/2022   Procedure: CYSTOSCOPY WITH RETROGRADE PYELOGRAM, URETEROSCOPY AND STENT REPLACEMENT;  Surgeon: Matilda Senior, MD;  Location: [facility];  Service: Urology;  Laterality: Left;  90 MINS   CYSTOSCOPY WITH RETROGRADE PYELOGRAM, URETEROSCOPY AND STENT PLACEMENT Left 07/06/2023   Procedure: CYSTOSCOPY WITH LEFT RETROGRADE PYELOGRAM, URETEROSCOPY, LASER ABLATION OF TUMOR AND STENT EXCHANGE;  Surgeon: Alvaro Ricardo KATHEE Mickey., MD;  Location[facility];  Service: Urology;  Laterality: Left;  60 MINUTES NEEDED FOR CASE   CYSTOSCOPY/URETEROSCOPY/HOLMIUM LASER Left 12/04/2022   Procedure: CYSTOSCOPY/URETEROSCOPY/  HOLMIUM LASER OF RENAL PELVIC LESIONS;  Surgeon: Matilda Senior, MD;  Location: [facility];  Service: Urology;  Laterality: Left;   CYSTOSCOPY/URETEROSCOPY/HOLMIUM LASER/STENT PLACEMENT Left 05/02/2021   Procedure: CYSTOSCOPY/URETEROSCOPY WITH BRUSH BIOPSY/ RETROGRADE PYELOGRAM/ HOLMIUM LASER/STENT REPLACEMENT;  Surgeon: Matilda Senior, MD;  Location: [facility];  Service: Urology;  Laterality: Left;   CYSTOSCOPY/URETEROSCOPY/HOLMIUM  LASER/STENT PLACEMENT Left 01/30/2024   Procedure: CYSTOSCOPY/DIAGNOSTIC URETEROSCOPY/ RETROGRADE/STENT EXCHANGE;  Surgeon: Alvaro Ricardo KATHEE Mickey., MD;  Location[facility];  Service: Urology;  Laterality: Left;   HOLMIUM LASER APPLICATION Left 09/16/2020   Procedure: HOLMIUM LASER APPLICATION;  Surgeon: Matilda Senior, MD;  Location: [facility];  Service: Urology;  Laterality: Left;   HOLMIUM LASER APPLICATION Left 10/24/2021   Procedure: HOLMIUM LASER APPLICATION OF TUMORS;  Surgeon: Matilda Senior, MD;  Location: [facility];  Service: Urology;  Laterality: Left;   HOLMIUM LASER APPLICATION Left 11/16/2022   Procedure: HOLMIUM LASER OF TUMORS;  Surgeon: Matilda Senior, MD;  Location: [facility];  Service: Urology;  Laterality: Left;   IR NEPHROSTOMY PLACEMENT LEFT  07/06/2021   IR NEPHROSTOMY PLACEMENT LEFT  02/06/2022   NEPHROSTOMY TUBE REMOVAL Left 03/23/2022   Procedure: NEPHROSTOMY TUBE REMOVAL;  Surgeon: Matilda Senior, MD;  Location: [facility];  Service: Urology;  Laterality: Left;   NEPHROURETERECTOMY Right 02/06/2007   @[facility]  by dr matilda;   Laparoscopic   RIGHT/LEFT HEART CATH AND CORONARY ANGIOGRAPHY N/A 11/06/2019   Procedure: RIGHT/LEFT HEART CATH AND CORONARY ANGIOGRAPHY;  Surgeon: Claudene Victory ORN, MD;  Location: [facility] INVASIVE CV LAB;  Service: Cardiovascular;  Laterality: N/A;   THULIUM LASER TURP (TRANSURETHRAL RESECTION OF PROSTATE) Left 08/18/2019   Procedure: THULIUM LASER ABLATION OF URETERAL TUMOR;  Surgeon: Matilda Senior, MD;  Location: [facility];  Service: Urology;  Laterality: Left;   THULIUM LASER TURP (TRANSURETHRAL RESECTION OF PROSTATE) Left 11/17/2019   Procedure: THULIUM LASER of URETERAL CANCER;  Surgeon: Matilda Senior, MD;  Location: [facility];  Service: Urology;  Laterality: Left;   TRANSURETHRAL RESECTION OF BLADDER TUMOR  12/12/2006   @[facility]  by dr andra   VAGINAL  HYSTERECTOMY  1980    MEDICATIONS:  acetaminophen  (TYLENOL ) 500 MG tablet   aspirin  EC 81 MG tablet   Calcium  Carbonate Antacid (TUMS PO)   carvedilol  (COREG ) 6.25 MG tablet   cholecalciferol  (VITAMIN D3) 25 MCG (1000 UNIT) tablet   Docusate Calcium  (STOOL SOFTENER PO)   Famotidine  (HEARTBURN RELIEF PO)   Ferrous Sulfate  (IRON PO)   ferrous sulfate  325 (65 FE) MG tablet   icosapent  Ethyl (VASCEPA ) 1 g capsule   icosapent  Ethyl (VASCEPA ) 1 g capsule   iron polysaccharides (NIFEREX) 150 MG capsule   isosorbide  mononitrate (IMDUR ) 30 MG 24 hr tablet   rosuvastatin  (CRESTOR ) 40 MG tablet   sertraline  (ZOLOFT ) 50 MG tablet   traZODone  (DESYREL ) 150 MG tablet   No current facility-administered medications for this encounter.    Harlene Hoots Ward, PA-C [facility] [phone]

## 2024-08-14 ENCOUNTER — Ambulatory Visit (HOSPITAL_COMMUNITY): Admitting: Certified Registered Nurse Anesthetist

## 2024-08-14 ENCOUNTER — Ambulatory Visit (HOSPITAL_COMMUNITY)

## 2024-08-14 ENCOUNTER — Ambulatory Visit (HOSPITAL_COMMUNITY)
Admission: RE | Admit: 2024-08-14 | Discharge: 2024-08-14 | Disposition: A | Discharge status: Home / Self Care | Attending: Urology | Admitting: Urology

## 2024-08-14 ENCOUNTER — Encounter (HOSPITAL_COMMUNITY): Payer: Self-pay | Admitting: Urology

## 2024-08-14 ENCOUNTER — Encounter (HOSPITAL_COMMUNITY): Payer: Self-pay | Admitting: Physician Assistant

## 2024-08-14 ENCOUNTER — Encounter (HOSPITAL_COMMUNITY): Admission: RE | Disposition: A | Payer: Self-pay | Source: Home / Self Care | Attending: Urology

## 2024-08-14 DIAGNOSIS — Z87891 Personal history of nicotine dependence: Secondary | ICD-10-CM | POA: Insufficient documentation

## 2024-08-14 DIAGNOSIS — Z833 Family history of diabetes mellitus: Secondary | ICD-10-CM | POA: Diagnosis not present

## 2024-08-14 DIAGNOSIS — D631 Anemia in chronic kidney disease: Secondary | ICD-10-CM | POA: Insufficient documentation

## 2024-08-14 DIAGNOSIS — R8271 Bacteriuria: Secondary | ICD-10-CM | POA: Diagnosis not present

## 2024-08-14 DIAGNOSIS — C652 Malignant neoplasm of left renal pelvis: Secondary | ICD-10-CM

## 2024-08-14 DIAGNOSIS — Z905 Acquired absence of kidney: Secondary | ICD-10-CM | POA: Insufficient documentation

## 2024-08-14 DIAGNOSIS — I2510 Atherosclerotic heart disease of native coronary artery without angina pectoris: Secondary | ICD-10-CM | POA: Insufficient documentation

## 2024-08-14 DIAGNOSIS — Z8551 Personal history of malignant neoplasm of bladder: Secondary | ICD-10-CM | POA: Diagnosis not present

## 2024-08-14 DIAGNOSIS — I130 Hypertensive heart and chronic kidney disease with heart failure and stage 1 through stage 4 chronic kidney disease, or unspecified chronic kidney disease: Secondary | ICD-10-CM | POA: Diagnosis not present

## 2024-08-14 DIAGNOSIS — Z8554 Personal history of malignant neoplasm of ureter: Secondary | ICD-10-CM | POA: Diagnosis not present

## 2024-08-14 DIAGNOSIS — I5042 Chronic combined systolic (congestive) and diastolic (congestive) heart failure: Secondary | ICD-10-CM

## 2024-08-14 DIAGNOSIS — R300 Dysuria: Secondary | ICD-10-CM | POA: Insufficient documentation

## 2024-08-14 DIAGNOSIS — M1990 Unspecified osteoarthritis, unspecified site: Secondary | ICD-10-CM | POA: Diagnosis not present

## 2024-08-14 DIAGNOSIS — N184 Chronic kidney disease, stage 4 (severe): Secondary | ICD-10-CM | POA: Diagnosis not present

## 2024-08-14 HISTORY — PX: CYSTOSCOPY/URETEROSCOPY/HOLMIUM LASER/STENT PLACEMENT: SHX6546

## 2024-08-14 HISTORY — PX: CYSTOSCOPY W/ RETROGRADES: SHX1426

## 2024-08-14 LAB — BASIC METABOLIC PANEL WITH GFR
Anion gap: 15 (ref 5–15)
BUN: 65 mg/dL — ABNORMAL HIGH (ref 8–23)
CO2: 16 mmol/L — ABNORMAL LOW (ref 22–32)
Calcium: 8.7 mg/dL — ABNORMAL LOW (ref 8.9–10.3)
Chloride: 102 mmol/L (ref 98–111)
Creatinine, Ser: 6.68 mg/dL — ABNORMAL HIGH (ref 0.44–1.00)
GFR, Estimated: 6 mL/min — ABNORMAL LOW (ref >60)
Glucose, Bld: 106 mg/dL — ABNORMAL HIGH (ref 70–99)
Potassium: 4.8 mmol/L (ref 3.5–5.1)
Sodium: 133 mmol/L — ABNORMAL LOW (ref 135–145)

## 2024-08-14 SURGERY — CYSTOSCOPY/URETEROSCOPY/HOLMIUM LASER/STENT PLACEMENT
Anesthesia: General | Site: Ureter | Laterality: Left

## 2024-08-14 MED ORDER — MIDAZOLAM HCL 2 MG/2ML IJ SOLN
INTRAMUSCULAR | Status: AC
  Filled 2024-08-14: qty 2

## 2024-08-14 MED ORDER — OXYCODONE HCL 5 MG PO TABS
5.0000 mg | ORAL_TABLET | Freq: Once | ORAL | Status: AC | PRN
  Administered 2024-08-14: 5 mg via ORAL

## 2024-08-14 MED ORDER — ONDANSETRON HCL 4 MG/2ML IJ SOLN: 4.0000 mg | Freq: Once | INTRAMUSCULAR | Status: DC | PRN

## 2024-08-14 MED ORDER — PROPOFOL 10 MG/ML IV BOLUS
INTRAVENOUS | Status: AC
  Filled 2024-08-14: qty 20

## 2024-08-14 MED ORDER — FENTANYL CITRATE (PF) 100 MCG/2ML IJ SOLN
INTRAMUSCULAR | Status: DC | PRN
  Administered 2024-08-14: 50 ug via INTRAVENOUS

## 2024-08-14 MED ORDER — ORAL CARE MOUTH RINSE: 15.0000 mL | Freq: Once | OROMUCOSAL | Status: AC

## 2024-08-14 MED ORDER — EPHEDRINE SULFATE-NACL 50-0.9 MG/10ML-% IV SOSY
PREFILLED_SYRINGE | INTRAVENOUS | Status: DC | PRN
  Administered 2024-08-14: 5 mg via INTRAVENOUS
  Administered 2024-08-14 (×3): 10 mg via INTRAVENOUS

## 2024-08-14 MED ORDER — OXYCODONE HCL 5 MG/5ML PO SOLN: 5.0000 mg | Freq: Once | ORAL | Status: AC | PRN

## 2024-08-14 MED ORDER — ONDANSETRON HCL 4 MG/2ML IJ SOLN
INTRAMUSCULAR | Status: AC
  Filled 2024-08-14: qty 2

## 2024-08-14 MED ORDER — MIDAZOLAM HCL 5 MG/5ML IJ SOLN
INTRAMUSCULAR | Status: DC | PRN
  Administered 2024-08-14: 1 mg via INTRAVENOUS

## 2024-08-14 MED ORDER — GENTAMICIN SULFATE 40 MG/ML IJ SOLN: 1.5000 mg/kg | INTRAVENOUS | Status: DC

## 2024-08-14 MED ORDER — LACTATED RINGERS IV SOLN: INTRAVENOUS | Status: DC

## 2024-08-14 MED ORDER — CIPROFLOXACIN HCL 500 MG PO TABS: 500.0000 mg | ORAL_TABLET | Freq: Every day | ORAL | 0 refills | Status: DC

## 2024-08-14 MED ORDER — GENTAMICIN IN SALINE 1.6-0.9 MG/ML-% IV SOLN
80.0000 mg | INTRAVENOUS | Status: AC
  Administered 2024-08-14: 80 mg via INTRAVENOUS
  Filled 2024-08-14: qty 50

## 2024-08-14 MED ORDER — CHLORHEXIDINE GLUCONATE 0.12 % MT SOLN
15.0000 mL | Freq: Once | OROMUCOSAL | Status: AC
Start: 2024-08-14 — End: 2024-08-14
  Administered 2024-08-14: 15 mL via OROMUCOSAL

## 2024-08-14 MED ORDER — LIDOCAINE HCL (CARDIAC) PF 100 MG/5ML IV SOSY
PREFILLED_SYRINGE | INTRAVENOUS | Status: DC | PRN
  Administered 2024-08-14: 100 mg via INTRATRACHEAL

## 2024-08-14 MED ORDER — PROPOFOL 10 MG/ML IV BOLUS
INTRAVENOUS | Status: DC | PRN
  Administered 2024-08-14: 150 mg via INTRAVENOUS

## 2024-08-14 MED ORDER — FENTANYL CITRATE (PF) 50 MCG/ML IJ SOSY: 25.0000 ug | PREFILLED_SYRINGE | INTRAMUSCULAR | Status: DC | PRN

## 2024-08-14 MED ORDER — SODIUM CHLORIDE 0.9 % IV SOLN: INTRAVENOUS | Status: DC

## 2024-08-14 MED ORDER — DEXAMETHASONE SOD PHOSPHATE PF 10 MG/ML IJ SOLN
INTRAMUSCULAR | Status: DC | PRN
  Administered 2024-08-14: 5 mg via INTRAVENOUS

## 2024-08-14 MED ORDER — OXYCODONE HCL 5 MG PO TABS
ORAL_TABLET | ORAL | Status: AC
  Filled 2024-08-14: qty 1

## 2024-08-14 MED ORDER — ONDANSETRON HCL 4 MG/2ML IJ SOLN
INTRAMUSCULAR | Status: DC | PRN
  Administered 2024-08-14: 4 mg via INTRAVENOUS

## 2024-08-14 MED ORDER — FENTANYL CITRATE (PF) 100 MCG/2ML IJ SOLN
INTRAMUSCULAR | Status: AC
  Filled 2024-08-14: qty 2

## 2024-08-14 MED ORDER — TRAMADOL HCL 50 MG PO TABS: 50.0000 mg | ORAL_TABLET | Freq: Four times a day (QID) | ORAL | 0 refills | Status: DC | PRN

## 2024-08-14 MED ORDER — GLYCOPYRROLATE 0.2 MG/ML IJ SOLN
INTRAMUSCULAR | Status: DC | PRN
  Administered 2024-08-14: .2 mg via INTRAVENOUS

## 2024-08-14 MED ORDER — SODIUM CHLORIDE 0.9 % IR SOLN
Status: DC | PRN
  Administered 2024-08-14: 3000 mL

## 2024-08-14 MED ORDER — ACETAMINOPHEN 10 MG/ML IV SOLN
1000.0000 mg | Freq: Once | INTRAVENOUS | Status: DC | PRN
  Administered 2024-08-14: 1000 mg via INTRAVENOUS

## 2024-08-14 MED ORDER — IOHEXOL 300 MG/ML  SOLN
INTRAMUSCULAR | Status: DC | PRN
  Administered 2024-08-14: 20 mL

## 2024-08-14 MED ORDER — PHENYLEPHRINE 80 MCG/ML (10ML) SYRINGE FOR IV PUSH (FOR BLOOD PRESSURE SUPPORT)
PREFILLED_SYRINGE | INTRAVENOUS | Status: DC | PRN
  Administered 2024-08-14 (×2): 80 ug via INTRAVENOUS

## 2024-08-14 MED ORDER — ACETAMINOPHEN 10 MG/ML IV SOLN
INTRAVENOUS | Status: AC
  Filled 2024-08-14: qty 100

## 2024-08-14 SURGICAL SUPPLY — 24 items
BAG URINE DRAIN 2000ML AR STRL (UROLOGICAL SUPPLIES) IMPLANT
BAG URO CATCHER STRL LF (MISCELLANEOUS) ×2 IMPLANT
BASKET LASER NITINOL 1.9FR (BASKET) IMPLANT
CATH URETL OPEN END 6FR 70 (CATHETERS) ×2 IMPLANT
CLOTH BEACON ORANGE TIMEOUT ST (SAFETY) ×2 IMPLANT
DRAPE FOOT SWITCH (DRAPES) ×2 IMPLANT
ELECT REM PT RETURN 15FT ADLT (MISCELLANEOUS) ×2 IMPLANT
GLOVE SURG LX STRL 7.5 STRW (GLOVE) ×2 IMPLANT
GOWN STRL REUS W/ TWL XL LVL3 (GOWN DISPOSABLE) ×2 IMPLANT
GUIDEWIRE ANG ZIPWIRE 038X150 (WIRE) ×2 IMPLANT
GUIDEWIRE STR DUAL SENSOR (WIRE) ×2 IMPLANT
KIT TURNOVER KIT A (KITS) ×2 IMPLANT
LOOP CUT BIPOLAR 24F LRG (ELECTROSURGICAL) IMPLANT
MANIFOLD NEPTUNE II (INSTRUMENTS) ×2 IMPLANT
NS IRRIG 1000ML POUR BTL (IV SOLUTION) IMPLANT
PACK CYSTO (CUSTOM PROCEDURE TRAY) ×2 IMPLANT
SHEATH NAVIGATOR HD 11/13X28 (SHEATH) IMPLANT
SHEATH NAVIGATOR HD 11/13X36 (SHEATH) IMPLANT
STENT POLARIS LOOP 8FR X 24 CM (STENTS) ×1 IMPLANT
SYRINGE TOOMEY IRRIG 70ML (MISCELLANEOUS) IMPLANT
TRACTIP FLEXIVA PULS ID 200XHI (Laser) IMPLANT
TUBE PU 8FR 16IN ENFIT (TUBING) ×2 IMPLANT
TUBING CONNECTING 10 (TUBING) ×2 IMPLANT
TUBING UROLOGY SET (TUBING) ×2 IMPLANT

## 2024-08-14 NOTE — Op Note (Signed)
 NAME: [name], [name] MEDICAL RECORD NO: [id number] ACCOUNT NO: [id number] DATE OF BIRTH: 10-Jan-1946 FACILITY: [facility] LOCATION: [facility] PHYSICIAN: Ricardo Likens, MD  Operative Report   DATE OF PROCEDURE: 08/14/2024  SURGEON:  Ricardo Likens, MD  PREOPERATIVE DIAGNOSES:  History of left ureteral cancer, solitary kidney, stage IV renal insufficiency.  PROCEDURE PERFORMED: 1.  Cystoscopy with left retrograde pyelogram interpretation. 2.  Left ureteroscopy, diagnostic. 3.  Exchange of left ureteral stent.  ESTIMATED BLOOD LOSS:  Nil.  COMPLICATION:  None.  SPECIMENS:  Left ureteral stent for discard.  FINDINGS: 1.  Unremarkable bladder. 2.  No hydronephrosis or obvious filling defects. 3.  No overt papillary tumor recurrence within the left ureter or kidney. 4.  Some area of relative narrowing and stricturing in the left mid ureter. 5.  Significant scarification on the left intrarenal collecting system. 6.  Successful replacement of the left ureteral stent proximal end in the renal pelvis, distal end in the urinary bladder.  No tether.  INDICATIONS:  The patient is a pleasant 78 year old lady with a history of multifocal urothelial carcinoma.  She is status post right nephroureterectomy previously.  She has a solitary left kidney with recurrence of tumor in her renal pelvis and ureter  and has been on a course for a number of years trying to keep her from dialysis.  She is status post numerous intravesical endoscopic laser ablations and intrarenal topical antineoplastic agent therapies.  She manages with a chronic stent and  surveillance ureteroscopy every 6 months.  It has been approximately 6 months since her most recent surveillance.  She presents for this today.  Informed consent was obtained and placed in medical record.  DESCRIPTION OF PROCEDURE:  The patient's identity is being verified and the procedure being left ureteroscopy with possible laser ablation and stent  exchange was confirmed.  Procedure timeout was performed.  Intravenous antibiotics administered.  General  LMA anesthesia was administered.  The patient was placed into a low lithotomy position.  A sterile field was created.  Prepping and draped the patient's vagina, introitus and proximal thigh using iodine.  Cystourethroscopy was performed using 21-French  rigid cystoscope with offset lens.  Inspection of the urinary bladder revealed proteinaceous-appearing urine.  No papillary lesions were noted.  The distal end of the left stent was grasped, removed in its entirety, set aside for discard, and inspected  and found to be intact.  The left ureteral orifice was then cannulated with a 6-French end-hole catheter and left retrograde pyelogram was obtained.  Left retrograde pyelogram demonstrated single left ureter, single system left kidney.  No obvious filling defects or narrowing noted.  No hydronephrosis was noted.  This was quite favorable.  A 0.038 ZIPwire was advanced into the lower pole and set-aside  as a safety wire.  An 8-French feeding tube was placed in the urinary bladder for pressure release.  Semirigid ureteroscopy was performed of the distal four-fifths of the left ureter alongside as a separate sensory working wire.  At the level of the mid  ureter, this area had relative narrowing and slight mucosal abnormality, but no frank intraluminal papillary tumor recurrence.  It was unclear if this site represented a prior focus of tumor versus known chronic narrowing and stricturing.  Regardless,  the area appeared fairly open and very favorable today.  Semi-rigid ureteroscope was then exchanged over the sensory working wire for a flexible digital ureteroscope at the level of the kidney and systematic inspection was performed of each calyx x2.  There was significant intrarenal scarification with a somewhat narrowed infundibulum; however, the entire kidney was able to be inspected and there was no  evidence of recurrence of papillary tumor whatsoever.  This was quite favorable.  The ureteroscope  was removed under continuous vision.  No significant mucosal abnormalities were found.  Given her history of multifocal tumor and marginal renal function, it was clearly felt that continuous stenting was warranted and a new 8 x 24 Polaris type stent was  carefully placed for the remaining safety wire under fluoroscopic guidance.  Good proximal and distal planes were noted.  The procedure was terminated.  The patient tolerated the procedure well. No immediate periprocedural complications.  The patient was  taken to post anesthesia care unit in stable condition with plans for discharge home.   PUS D: 08/14/2024 1:17:42 pm T: 08/14/2024 2:53:00 pm  JOB: [id number]/ [id number]

## 2024-08-14 NOTE — Transfer of Care (Signed)
 Immediate Anesthesia Transfer of Care Note  Patient: [name]  Procedure(s) Performed: CYSTOSCOPY/DIAGNOSTIC URETEROSCOPY/STENT EXCHANGE (Left: Ureter) CYSTOSCOPY, WITH RETROGRADE PYELOGRAM (Left: Ureter)  Patient Location: PACU  Anesthesia Type:General  Level of Consciousness: awake, alert , oriented, and patient cooperative  Airway & Oxygen Therapy: Patient Spontanous Breathing and Patient connected to face mask oxygen  Post-op Assessment: Report given to RN and Post -op Vital signs reviewed and stable  Post vital signs: Reviewed and stable  Last Vitals:  Vitals Value Taken Time  BP 131/63 08/14/24 13:28  Temp    Pulse 74 08/14/24 13:30  Resp 12 08/14/24 13:30  SpO2 100 % 08/14/24 13:30  Vitals shown include unfiled device data.  Last Pain:  Vitals:   08/14/24 1046  TempSrc: Oral  PainSc: 0-No pain      Patients Stated Pain Goal: 4 (08/14/24 1046)  Complications: No notable events documented.

## 2024-08-14 NOTE — Discharge Instructions (Signed)
 1 - You may have urinary urgency (bladder spasms) and bloody urine on / off with stent in place. This is normal.  2 - Call MD or go to ER for fever >102, severe pain / nausea / vomiting not relieved by medications, or acute change in medical status

## 2024-08-14 NOTE — H&P (Signed)
 [name] is an 78 y.o. female.    Chief Complaint: Pre-OP LEFT Ureteroscopy / Possible Tumor Ablation / Stent Exchange  HPI:   1 - Multifocal Possibly Metastatic Urothelial Carcinoma - Long h/o high grade bladder and upper tract cancer since 2008 s/p right neph-U. Left high grade renal pelvis cancer since 2020. BCG induction 2021. Jelmyto  via nephrostomy 2022 and 2023 (two separate cycles)  Recnet Course: 11/2022 - Let 7x24 JJ stent exchange by Dahlsted / laser ablation few small ? lesions; 06/2023 CT - non-specific hilarl adenopathy (<2cm, prev PET negative), OR ureteroscopioc tumro ablation / stent exchange 12/2023 - CT chest/abd/pelvis - no over metastatic disease, stable/improved shoddy adenopathy. 01/2024 - OR left ureteroscopy / stent exchange (7x24 contour)  2 - Stage 4 Renal Insuficiency / Solitary Kidney - Cr 2s, GFR 20s x many. Solitary left kidney with left ureteral strictures / narrowing / stent dependant.  3 - Bacteruria - non-clonal CX's x several. Most recent CX Enterococcus colonization sens cipro , nitroF, vanc.  PMH sig for CAD (med mgm't follows [facility]), benign hst. LIves independantly with husband. Her PCP is Lamarr Rotunda MD.  Today  [name]  is seen for left ureteroscopy for ureteral tumor / stent exchange. No interval fevers.        Past Medical History:  Diagnosis Date   Anemia associated with chronic renal failure    Arthritis    Blood dyscrasia 2008   hx of PE   CAD in native artery 10/2019   cardiologist-   dr christella. santo;   a. 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   urologist--- dr dahlstedt/  oncologist--- dr amadeo;   dx 09/ 2020 high grade urothelial  s/p laser ablation, ;  recurrent s/p BCG instillation,  completed chemo instilation 11/ 2022  and repeat chemo completed 05/ 2023   Chronic combined systolic and diastolic  CHF (congestive heart failure) (HCC) 06/2019   followed by cardiology;   dx 09/ 2022 in setting of sepsis, pulm edema;   05/ 2022  ef 40-45% per echo,  recovered per cath 01/ 2021 ef 50%;  laste echo 08/ 2023  ef 60-65%   CKD (chronic kidney disease), stage III (HCC)    Depression    Dysrhythmia    bradycardia   GAD (generalized anxiety disorder)    GERD (gastroesophageal reflux disease)    History of bladder cancer 12/2006   followed by dr matilda   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   dx 04/ 2008  w/ poor function hydronephrotic  right kidney;   02-06-2007  s/p right nephroureterectomy   History of kidney stones    History of pulmonary embolism 04/2022   in setting severe sepsis;  small RLL, treated w/ 3 months eliquis    Hyperlipidemia, mixed    Hypertension    Solitary kidney, acquired 02/06/2007   s/p  right nephroureterectomy for cancer    Past Surgical History:  Procedure Laterality Date   CATARACT EXTRACTION W/ INTRAOCULAR LENS IMPLANT Bilateral 2011   CYSTOSCOPY W/ RETROGRADES Left 01/30/2024   Procedure: CYSTOSCOPY, WITH RETROGRADE PYELOGRAM;  Surgeon: Alvaro Ricardo KATHEE Mickey., MD;  Location: [facility];  Service: Urology;  Laterality: Left;   CYSTOSCOPY W/ URETERAL STENT PLACEMENT Left 11/17/2019   Procedure: CYSTOSCOPY WITH STENT REPLACEMENT;  Surgeon: Matilda Senior, MD;  Location: [facility];  Service: Urology;  Laterality: Left;  CYSTOSCOPY W/ URETERAL STENT PLACEMENT Left 03/23/2022   Procedure: CYSTOSCOPY WITH RETROGRADE PYELOGRAM, LEFT URETERAL STENT PLACEMENT;  Surgeon: Matilda Senior, MD;  Location: [facility];  Service: Urology;  Laterality: Left;   CYSTOSCOPY W/ URETERAL STENT PLACEMENT Left 06/09/2022   Procedure: CYSTOSCOPY WITH LEFT STENT EXCHANGE;  Surgeon: Matilda Senior, MD;  Location: [facility];  Service: Urology;  Laterality: Left;   CYSTOSCOPY W/ URETERAL STENT PLACEMENT Left 12/04/2022   Procedure: CYSTOSCOPY WITH  STENT REPLACEMENT;  Surgeon: Matilda Senior, MD;  Location: [facility];  Service: Urology;  Laterality: Left;   CYSTOSCOPY W/ URETERAL STENT REMOVAL Left 05/10/2020   Procedure: CYSTOSCOPY WITH STENT REMOVAL;  Surgeon: Matilda Senior, MD;  Location: [facility];  Service: Urology;  Laterality: Left;   CYSTOSCOPY W/ URETERAL STENT REMOVAL Left 05/02/2021   Procedure: CYSTOSCOPY WITH STENT REMOVAL;  Surgeon: Matilda Senior, MD;  Location: [facility];  Service: Urology;  Laterality: Left;   CYSTOSCOPY W/ URETERAL STENT REMOVAL Left 11/16/2022   Procedure: CYSTOSCOPY WITH STENT REMOVAL;  Surgeon: Matilda Senior, MD;  Location: [facility];  Service: Urology;  Laterality: Left;   CYSTOSCOPY WITH RETROGRADE PYELOGRAM, URETEROSCOPY AND STENT PLACEMENT Left 06/27/2019   Procedure: CYSTOSCOPY WITH LEFT RETROGRADE PYELOGRAM, URETEROSCOPY, BIOPSY AND LEFT STENT PLACEMENT;  Surgeon: Watt Rush, MD;  Location: [facility];  Service: Urology;  Laterality: Left;   CYSTOSCOPY WITH RETROGRADE PYELOGRAM, URETEROSCOPY AND STENT PLACEMENT Left 08/18/2019   Procedure: CYSTOSCOPY, URETEROSCOPY AND STENT EXCHANGE;  Surgeon: Matilda Senior, MD;  Location: [facility];  Service: Urology;  Laterality: Left;  90 MINS   CYSTOSCOPY WITH RETROGRADE PYELOGRAM, URETEROSCOPY AND STENT PLACEMENT Left 11/17/2019   Procedure: CYSTOSCOPY WITH RETROGRADE PYELOGRAM, URETEROSCOPY AND STENT PLACEMENT;  Surgeon: Matilda Senior, MD;  Location: [facility];  Service: Urology;  Laterality: Left;  90 MINS   CYSTOSCOPY WITH RETROGRADE PYELOGRAM, URETEROSCOPY AND STENT PLACEMENT Left 05/10/2020   Procedure: CYSTOSCOPY WITH RETROGRADE PYELOGRAM, URETEROSCOPY AND STENT PLACEMENT WITH URETHRAL DIALATION AND BRUSH BIOPSY;  Surgeon: Matilda Senior, MD;  Location: [facility];  Service: Urology;  Laterality: Left;  1 HR   CYSTOSCOPY WITH  RETROGRADE PYELOGRAM, URETEROSCOPY AND STENT PLACEMENT Left 09/16/2020   Procedure: CYSTOSCOPY WITH RETROGRADE PYELOGRAM, URETEROSCOPY ,  LITHOPAXY, LEFT URETERAL BRUSHING, AND STENT REPLACEMENT;  Surgeon: Matilda Senior, MD;  Location: [facility];  Service: Urology;  Laterality: Left;   CYSTOSCOPY WITH RETROGRADE PYELOGRAM, URETEROSCOPY AND STENT PLACEMENT Left 10/24/2021   Procedure: CYSTOSCOPY WITH RETROGRADE PYELOGRAM, URETEROSCOPY, POSSIBLE URETERAL AND RENAL BIOPSIES AND STENT PLACEMENT;  Surgeon: Matilda Senior, MD;  Location: [facility];  Service: Urology;  Laterality: Left;  1 HR   CYSTOSCOPY WITH RETROGRADE PYELOGRAM, URETEROSCOPY AND STENT PLACEMENT Left 03/09/2022   Procedure: CYSTOSCOPY WITH ANTEGRADE PYELOGRAM,  STENT REMOVAL;  Surgeon: Matilda Senior, MD;  Location: [facility];  Service: Urology;  Laterality: Left;   CYSTOSCOPY WITH RETROGRADE PYELOGRAM, URETEROSCOPY AND STENT PLACEMENT  01/09/2007   @[facility]  by dr matilda;    bx's/ washing  bladder , right ureter   CYSTOSCOPY WITH RETROGRADE PYELOGRAM, URETEROSCOPY AND STENT PLACEMENT Left 11/16/2022   Procedure: CYSTOSCOPY WITH RETROGRADE PYELOGRAM, URETEROSCOPY AND STENT REPLACEMENT;  Surgeon: Matilda Senior, MD;  Location: [facility];  Service: Urology;  Laterality: Left;  90 MINS   CYSTOSCOPY WITH RETROGRADE PYELOGRAM, URETEROSCOPY AND STENT PLACEMENT Left 07/06/2023   Procedure: CYSTOSCOPY WITH LEFT RETROGRADE PYELOGRAM, URETEROSCOPY, LASER ABLATION OF  TUMOR AND STENT EXCHANGE;  Surgeon: Alvaro Ricardo KATHEE Mickey., MD;  Location: [facility];  Service: Urology;  Laterality: Left;  60 MINUTES NEEDED FOR CASE   CYSTOSCOPY/URETEROSCOPY/HOLMIUM LASER Left 12/04/2022   Procedure: CYSTOSCOPY/URETEROSCOPY/  HOLMIUM LASER OF RENAL PELVIC LESIONS;  Surgeon: Matilda Senior, MD;  Location: [facility];  Service: Urology;  Laterality: Left;    CYSTOSCOPY/URETEROSCOPY/HOLMIUM LASER/STENT PLACEMENT Left 05/02/2021   Procedure: CYSTOSCOPY/URETEROSCOPY WITH BRUSH BIOPSY/ RETROGRADE PYELOGRAM/ HOLMIUM LASER/STENT REPLACEMENT;  Surgeon: Matilda Senior, MD;  Location: [facility];  Service: Urology;  Laterality: Left;   CYSTOSCOPY/URETEROSCOPY/HOLMIUM LASER/STENT PLACEMENT Left 01/30/2024   Procedure: CYSTOSCOPY/DIAGNOSTIC URETEROSCOPY/ RETROGRADE/STENT EXCHANGE;  Surgeon: Alvaro Ricardo KATHEE Mickey., MD;  Location: [facility];  Service: Urology;  Laterality: Left;   HOLMIUM LASER APPLICATION Left 09/16/2020   Procedure: HOLMIUM LASER APPLICATION;  Surgeon: Matilda Senior, MD;  Location: [facility];  Service: Urology;  Laterality: Left;   HOLMIUM LASER APPLICATION Left 10/24/2021   Procedure: HOLMIUM LASER APPLICATION OF TUMORS;  Surgeon: Matilda Senior, MD;  Location: [facility];  Service: Urology;  Laterality: Left;   HOLMIUM LASER APPLICATION Left 11/16/2022   Procedure: HOLMIUM LASER OF TUMORS;  Surgeon: Matilda Senior, MD;  Location: [facility];  Service: Urology;  Laterality: Left;   IR NEPHROSTOMY PLACEMENT LEFT  07/06/2021   IR NEPHROSTOMY PLACEMENT LEFT  02/06/2022   NEPHROSTOMY TUBE REMOVAL Left 03/23/2022   Procedure: NEPHROSTOMY TUBE REMOVAL;  Surgeon: Matilda Senior, MD;  Location: [facility];  Service: Urology;  Laterality: Left;   NEPHROURETERECTOMY Right 02/06/2007   @[facility]  by dr matilda;   Laparoscopic   RIGHT/LEFT HEART CATH AND CORONARY ANGIOGRAPHY N/A 11/06/2019   Procedure: RIGHT/LEFT HEART CATH AND CORONARY ANGIOGRAPHY;  Surgeon: Claudene Victory ORN, MD;  Location: [facility] INVAS[facility] CV [facility];  Service: Cardiovascular;  Laterality: N/A;   THULIUM LASER TURP (TRANSURETHRAL RESECTION OF PROSTATE) Left 08/18/2019   Procedure: THULIUM LASER ABLATION OF URETERAL TUMOR;  Surgeon: Matilda Senior, MD;  Location: [facility];  Service: Urology;  Laterality:  Left;   THULIUM LASER TURP (TRANSURETHRAL RESECTION OF PROSTATE) Left 11/17/2019   Procedure: THULIUM LASER of URETERAL CANCER;  Surgeon: Matilda Senior, MD;  Location: [facility];  Service: Urology;  Laterality: Left;   TRANSURETHRAL RESECTION OF BLADDER TUMOR  12/12/2006   @[facility]  by dr andra   VAGINAL HYSTERECTOMY  1980    Family History  Problem Relation Age of Onset   Hypertension Mother    Diabetes Mellitus II Sister    Hypertension Sister    Hypertension Brother    BRCA 1/2 Neg Hx    Breast cancer Neg Hx    Social History:  reports that she quit smoking about 20 years ago. Her smoking use included cigarettes. She started smoking about 58 years ago. She has a 19 pack-year smoking history. She has never used smokeless tobacco. She reports that she does not currently use alcohol. She reports that she does not use drugs.  Allergies:  Allergies  Allergen Reactions   Erythromycin Diarrhea and Nausea And Vomiting   Lotensin [Benazepril Hcl] Cough   Macrodantin [Nitrofurantoin] Diarrhea    And night sweats    No medications prior to admission.    No results found for this or any previous visit (from the past 48 hours). No results found.  Review of Systems  Constitutional:  Negative for chills and fever.  Genitourinary:  Positive for dysuria.  All other systems reviewed and are  negative.   There were no vitals taken for this visit. Physical Exam Vitals reviewed.  HENT:     Head: Normocephalic.  Eyes:     Pupils: Pupils are equal, round, and reactive to light.  Cardiovascular:     Rate and Rhythm: Normal rate.  Pulmonary:     Effort: Pulmonary effort is normal.  Abdominal:     General: Abdomen is flat.  Genitourinary:    Comments: No CVAT at present Musculoskeletal:        General: Normal range of motion.     Cervical back: Normal range of motion.  Skin:    General: Skin is warm.  Neurological:     General: No focal deficit present.      Mental Status: She is alert.  Psychiatric:        Mood and Affect: Mood normal.      Assessment/Plan  Proceed as planned  with LEFT retrograde / ureteroscopy / possible tumor ablation / stent exchange for ureteral tumro with solitary kidney. RIsks, benefits, alterantives, expected peri-op course discussed previously and reiterated today.   Ricardo KATHEE Alvaro Mickey., MD 08/14/2024, 7:50 AM

## 2024-08-14 NOTE — Anesthesia Preprocedure Evaluation (Signed)
 Anesthesia Evaluation  Patient identified by MRN, date of birth, ID band Patient awake    Reviewed: Allergy & Precautions, NPO status , Patient's Chart, lab work & pertinent test results, reviewed documented beta blocker date and time   History of Anesthesia Complications Negative for: history of anesthetic complications  Airway Mallampati: II  TM Distance: >3 FB     Dental no notable dental hx.    Pulmonary neg COPD, former smoker   breath sounds clear to auscultation       Cardiovascular hypertension, + CAD and +CHF  (-) Past MI and (-) Cardiac Stents + dysrhythmias  Rhythm:Regular Rate:Normal     Neuro/Psych neg Seizures PSYCHIATRIC DISORDERS Anxiety Depression       GI/Hepatic ,GERD  ,,(+) neg Cirrhosis        Endo/Other    Renal/GU CRFRenal disease     Musculoskeletal  (+) Arthritis ,    Abdominal   Peds  Hematology  (+) Blood dyscrasia, anemia   Anesthesia Other Findings   Reproductive/Obstetrics                              Anesthesia Physical Anesthesia Plan  ASA: 3  Anesthesia Plan: General   Post-op Pain Management:    Induction: Intravenous  PONV Risk Score and Plan: 2 and Ondansetron  and Dexamethasone   Airway Management Planned: LMA  Additional Equipment:   Intra-op Plan:   Post-operative Plan: Extubation in OR  Informed Consent: I have reviewed the patients History and Physical, chart, labs and discussed the procedure including the risks, benefits and alternatives for the proposed anesthesia with the patient or authorized representative who has indicated his/her understanding and acceptance.     Dental advisory given  Plan Discussed with: CRNA  Anesthesia Plan Comments:          Anesthesia Quick Evaluation

## 2024-08-14 NOTE — Brief Op Note (Signed)
 08/14/2024  1:12 PM  PATIENT:  [name]  78 y.o. female  PRE-OPERATIVE DIAGNOSIS:  KIDNEY CANCER, RENAL PELVIS  POST-OPERATIVE DIAGNOSIS:  KIDNEY CANCER, RENAL PELVIS  PROCEDURE:  Procedure(s): CYSTOSCOPY/DIAGNOSTIC URETEROSCOPY/STENT EXCHANGE (Left) CYSTOSCOPY, WITH RETROGRADE PYELOGRAM (Left)  SURGEON:  Surgeons and Role:    * Manny, Ricardo KATHEE Raddle., MD - Primary  PHYSICIAN ASSISTANT:   ASSISTANTS: none   ANESTHESIA:   general  EBL:  minimal   BLOOD ADMINISTERED:none  DRAINS: none   LOCAL MEDICATIONS USED:  NONE  SPECIMEN:  No Specimen  DISPOSITION OF SPECIMEN:  N/A  COUNTS:  YES  TOURNIQUET:  * No tourniquets in log *  DICTATION: .Other Dictation: Dictation Number [id number]  PLAN OF CARE: Discharge to home after PACU  PATIENT DISPOSITION:  PACU - hemodynamically stable.   Delay start of Pharmacological VTE agent (>24hrs) due to surgical blood loss or risk of bleeding: not applicable

## 2024-08-14 NOTE — Anesthesia Procedure Notes (Signed)
 Procedure Name: LMA Insertion Date/Time: 08/14/2024 12:43 PM  Performed by: Franchot Delon RAMAN, CRNAPre-anesthesia Checklist: Patient identified, Emergency Drugs available, Suction available and Patient being monitored Patient Re-evaluated:Patient Re-evaluated prior to induction Oxygen Delivery Method: Circle System Utilized Preoxygenation: Pre-oxygenation with 100% oxygen Induction Type: IV induction Ventilation: Mask ventilation without difficulty LMA: LMA inserted LMA Size: 4.0 Number of attempts: 1 Placement Confirmation: positive ETCO2 Tube secured with: Tape Dental Injury: Teeth and Oropharynx as per pre-operative assessment

## 2024-08-15 ENCOUNTER — Encounter (HOSPITAL_COMMUNITY): Payer: Self-pay | Admitting: Urology

## 2024-08-15 NOTE — Anesthesia Postprocedure Evaluation (Signed)
 Anesthesia Post Note  Patient: [name]  Procedure(s) Performed: CYSTOSCOPY/DIAGNOSTIC URETEROSCOPY/STENT EXCHANGE (Left: Ureter) CYSTOSCOPY, WITH RETROGRADE PYELOGRAM (Left: Ureter)     Patient location during evaluation: PACU Anesthesia Type: General Level of consciousness: awake and alert Pain management: pain level controlled Vital Signs Assessment: post-procedure vital signs reviewed and stable Respiratory status: spontaneous breathing, nonlabored ventilation, respiratory function stable and patient connected to nasal cannula oxygen Cardiovascular status: blood pressure returned to baseline and stable Postop Assessment: no apparent nausea or vomiting Anesthetic complications: no   No notable events documented.  Last Vitals:  Vitals:   08/14/24 1428 08/14/24 1435  BP: 134/68 122/62  Pulse: 61 69  Resp: 17 18  Temp:  (!) 36.4 C  SpO2: 92% 91%    Last Pain:  Vitals:   08/14/24 1435  TempSrc: Oral  PainSc: 0-No pain                 Lynwood MARLA Cornea

## 2024-08-22 ENCOUNTER — Other Ambulatory Visit (HOSPITAL_COMMUNITY): Payer: Self-pay

## 2024-08-26 ENCOUNTER — Other Ambulatory Visit: Payer: Self-pay

## 2024-08-26 DIAGNOSIS — C689 Malignant neoplasm of urinary organ, unspecified: Secondary | ICD-10-CM

## 2024-08-27 ENCOUNTER — Inpatient Hospital Stay: Attending: Hematology

## 2024-08-27 ENCOUNTER — Inpatient Hospital Stay: Admitting: Hematology

## 2024-08-27 VITALS — BP 125/60 | HR 68 | Temp 97.3°F | Resp 20 | Wt 172.6 lb

## 2024-08-27 DIAGNOSIS — N189 Chronic kidney disease, unspecified: Secondary | ICD-10-CM | POA: Insufficient documentation

## 2024-08-27 DIAGNOSIS — C689 Malignant neoplasm of urinary organ, unspecified: Secondary | ICD-10-CM | POA: Diagnosis not present

## 2024-08-27 DIAGNOSIS — D649 Anemia, unspecified: Secondary | ICD-10-CM | POA: Insufficient documentation

## 2024-08-27 DIAGNOSIS — Z87891 Personal history of nicotine dependence: Secondary | ICD-10-CM | POA: Diagnosis not present

## 2024-08-27 DIAGNOSIS — C679 Malignant neoplasm of bladder, unspecified: Secondary | ICD-10-CM | POA: Insufficient documentation

## 2024-08-27 LAB — CBC WITH DIFFERENTIAL (CANCER CENTER ONLY)
Abs Immature Granulocytes: 0.04 K/uL (ref 0.00–0.07)
Basophils Absolute: 0.1 K/uL (ref 0.0–0.1)
Basophils Relative: 1 %
Eosinophils Absolute: 0.3 K/uL (ref 0.0–0.5)
Eosinophils Relative: 3 %
HCT: 28.2 % — ABNORMAL LOW (ref 36.0–46.0)
Hemoglobin: 9 g/dL — ABNORMAL LOW (ref 12.0–15.0)
Immature Granulocytes: 0 %
Lymphocytes Relative: 18 %
Lymphs Abs: 1.8 K/uL (ref 0.7–4.0)
MCH: 29.3 pg (ref 26.0–34.0)
MCHC: 31.9 g/dL (ref 30.0–36.0)
MCV: 91.9 fL (ref 80.0–100.0)
Monocytes Absolute: 0.5 K/uL (ref 0.1–1.0)
Monocytes Relative: 5 %
Neutro Abs: 7.2 K/uL (ref 1.7–7.7)
Neutrophils Relative %: 73 %
Platelet Count: 237 K/uL (ref 150–400)
RBC: 3.07 MIL/uL — ABNORMAL LOW (ref 3.87–5.11)
RDW: 14.3 % (ref 11.5–15.5)
WBC Count: 9.9 K/uL (ref 4.0–10.5)
nRBC: 0 % (ref 0.0–0.2)

## 2024-08-27 LAB — CMP (CANCER CENTER ONLY)
ALT: 14 U/L (ref 0–44)
AST: 17 U/L (ref 15–41)
Albumin: 3.6 g/dL (ref 3.5–5.0)
Alkaline Phosphatase: 67 U/L (ref 38–126)
Anion gap: 15 (ref 5–15)
BUN: 65 mg/dL — ABNORMAL HIGH (ref 8–23)
CO2: 19 mmol/L — ABNORMAL LOW (ref 22–32)
Calcium: 9 mg/dL (ref 8.9–10.3)
Chloride: 102 mmol/L (ref 98–111)
Creatinine: 6.3 mg/dL — ABNORMAL HIGH (ref 0.44–1.00)
GFR, Estimated: 6 mL/min — ABNORMAL LOW (ref >60)
Glucose, Bld: 137 mg/dL — ABNORMAL HIGH (ref 70–99)
Potassium: 4.6 mmol/L (ref 3.5–5.1)
Sodium: 135 mmol/L (ref 135–145)
Total Bilirubin: <0.2 mg/dL (ref 0.0–1.2)
Total Protein: 7.8 g/dL (ref 6.5–8.1)

## 2024-08-27 NOTE — Progress Notes (Signed)
 HEMATOLOGY ONCOLOGY PROGRESS NOTE  Date of service: 08/27/2024  Patient Care Team: Chrystal Lamarr RAMAN, MD as PCP - General (Family Medicine) Santo Stanly LABOR, MD as PCP - Cardiology (Cardiology) Onesimo Emaline Brink, MD as Consulting Physician (Hematology)  CHIEF COMPLAINT/PURPOSE OF CONSULTATION: Follow-up for continued evaluation and management of urothelial carcinoma    Secondary diagnoses: high-grade urothelial carcinoma of the right distal ureter in 2008.    HISTORY OF PRESENTING ILLNESS:  [name] is a wonderful 78 y.o. female who is here for continued evaluation and management of Urothelial carcinoma. Patient has been transferred to us  by Dr. Amadeo. Patient was diagnosed with Ureteral cancer in September 2020 and was found to have high-grade urothelial carcinoma without any clear-cut muscle invasion. Her current treatment includes intermittent cystoscopy and tumor ablation.   Patient was last seen by Dr. Amadeo on 09/13/2022 and she complained of occasional dizziness without syncope or falls.    Patient is accompanied by her husband during this visit. She reports she has been doing fairly well without any new or severe medical concerns since her last visit with Dr. Amadeo.    She denies fever, chills, night sweats, unexpected weight loss, abdominal pain, chest pain, new lump/bump, back pain, hematuria, abnormal bowel moment, shortness of breath, or leg swelling.    Patient notes she has been eating well overall and her weight has been stable.    Patient denies any PET or CT scans. She denies of any urinary infection.   She continues to follow-up with Dr. Matilda, but notes that Dr. Matilda is retiring soon.  Prior Therapy:   She is status post right nephroureterectomy for a distal ureteral carcinoma in 2008   She is status post cystoscopy and a stent placement in September 2020 and repeated in February 2021 for a left ureteral tumor.  The cytology  showed high-grade urothelial carcinoma.   She is status post BCG treatment in 2021.  Subsequent cystoscopy in 2022 showed atypia without obvious urothelial carcinoma.  In July 2022 cytology showed high-grade urothelial carcinoma.   She is status post Jelmyto  instillation under the care of Dr. Matilda in September 2022 was completed in November 2022.  Following cystoscopy in January 2023 did show some residual tumor by cytology.   In May 2023 she completed repeat Jelmyto  infusion.      Current therapy: Intermittent cystoscopy and tumor ablation.  Under consideration for systemic therapy.  INTERVAL HISTORY:  [name] is a 78 y.o. female who is here today for continued evaluation and management of urothelial carcinoma. accompanied by husband   she was last seen by me on 07/29/2024; at the time she mentioned experiencing a UTI, decreased appetite, fevers/chills, and nausea.  Today, she was recently seen in the hospital on 08/14/2024 to undergo a left ureteroscopy by Dr. Alvaro. He apparently did not find any obstruction to contribute to elevation of kidney functions. Prior to this, she mentions having seen her Nephrologist, Dr. Dolan who suggested she undergo dialysis after her procedure - will be following-up with him on December 1st.   She endorses fatigue and some blood in urine on Monday; experiencing some stomach discomfort, which she attributed to nerves.  Denies any fevers/chills, drenching night sweats, chest pain, new lumps/bumps, or SOB. Does mention onset of a new pounding headache occurring once weekly, relieved with Tylenol . No related triggers.  REVIEW OF SYSTEMS:   10 Point review of systems of done and is negative except as noted above.  MEDICAL HISTORY  Past Medical History:  Diagnosis Date   Anemia associated with chronic renal failure    Arthritis    Blood dyscrasia 2008   hx of PE   CAD in native artery 10/2019   cardiologist-   dr christella. santo;   a.  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   urologist--- dr dahlstedt/  oncologist--- dr amadeo;   dx 09/ 2020 high grade urothelial  s/p laser ablation, ;  recurrent s/p BCG instillation,  completed chemo instilation 11/ 2022  and repeat chemo completed 05/ 2023   Chronic combined systolic and diastolic CHF (congestive heart failure) (HCC) 06/2019   followed by cardiology;   dx 09/ 2022 in setting of sepsis, pulm edema;   05/ 2022  ef 40-45% per echo,  recovered per cath 01/ 2021 ef 50%;  laste echo 08/ 2023  ef 60-65%   CKD (chronic kidney disease), stage III (HCC)    Depression    Dysrhythmia    bradycardia   GAD (generalized anxiety disorder)    GERD (gastroesophageal reflux disease)    History of bladder cancer 12/2006   followed by dr matilda   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   dx 04/ 2008  w/ poor function hydronephrotic  right kidney;   02-06-2007  s/p right nephroureterectomy   History of kidney stones    History of pulmonary embolism 04/2022   in setting severe sepsis;  small RLL, treated w/ 3 months eliquis    Hyperlipidemia, mixed    Hypertension    Solitary kidney, acquired 02/06/2007   s/p  right nephroureterectomy for cancer    SURGICAL HISTORY Past Surgical History:  Procedure Laterality Date   CATARACT EXTRACTION W/ INTRAOCULAR LENS IMPLANT Bilateral 2011   CYSTOSCOPY W/ RETROGRADES Left 01/30/2024   Procedure: CYSTOSCOPY, WITH RETROGRADE PYELOGRAM;  Surgeon: Alvaro Ricardo KATHEE Mickey., MD;  Location: [facility];  Service: Urology;  Laterality: Left;   CYSTOSCOPY W/ RETROGRADES Left 08/14/2024   Procedure: CYSTOSCOPY, WITH RETROGRADE PYELOGRAM;  Surgeon: Alvaro Ricardo KATHEE Mickey., MD;  Location: [facility];  Service: Urology;  Laterality: Left;   CYSTOSCOPY W/ URETERAL STENT PLACEMENT Left 11/17/2019   Procedure: CYSTOSCOPY WITH STENT REPLACEMENT;  Surgeon: Matilda Senior,  MD;  Location[facility];  Service: Urology;  Laterality: Left;   CYSTOSCOPY W/ URETERAL STENT PLACEMENT Left 03/23/2022   Procedure: CYSTOSCOPY WITH RETROGRADE PYELOGRAM, LEFT URETERAL STENT PLACEMENT;  Surgeon: Matilda Senior, MD;  Location: [facility];  Service: Urology;  Laterality: Left;   CYSTOSCOPY W/ URETERAL STENT PLACEMENT Left 06/09/2022   Procedure: CYSTOSCOPY WITH LEFT STENT EXCHANGE;  Surgeon: Matilda Senior, MD;  Location: [facility];  Service: Urology;  Laterality: Left;   CYSTOSCOPY W/ URETERAL STENT PLACEMENT Left 12/04/2022   Procedure: CYSTOSCOPY WITH STENT REPLACEMENT;  Surgeon: Matilda Senior, MD;  Location: [facility];  Service: Urology;  Laterality: Left;   CYSTOSCOPY W/ URETERAL STENT REMOVAL Left 05/10/2020   Procedure: CYSTOSCOPY WITH STENT REMOVAL;  Surgeon: Matilda Senior, MD;  Location: [facility];  Service: Urology;  Laterality: Left;   CYSTOSCOPY W/ URETERAL STENT REMOVAL Left 05/02/2021   Procedure: CYSTOSCOPY WITH STENT REMOVAL;  Surgeon: Matilda Senior, MD;  Location: [facility];  Service: Urology;  Laterality: Left;   CYSTOSCOPY W/ URETERAL STENT REMOVAL Left 11/16/2022   Procedure: CYSTOSCOPY WITH STENT REMOVAL;  Surgeon: Matilda Senior, MD;  Location: [facility];  Service: Urology;  Laterality: Left;   CYSTOSCOPY WITH RETROGRADE PYELOGRAM, URETEROSCOPY AND STENT PLACEMENT Left 06/27/2019   Procedure: CYSTOSCOPY WITH LEFT RETROGRADE PYELOGRAM, URETEROSCOPY, BIOPSY AND LEFT STENT PLACEMENT;  Surgeon: Watt Rush, MD;  Location: [facility];  Service: Urology;  Laterality: Left;   CYSTOSCOPY WITH RETROGRADE PYELOGRAM, URETEROSCOPY AND STENT PLACEMENT Left 08/18/2019   Procedure: CYSTOSCOPY, URETEROSCOPY AND STENT EXCHANGE;  Surgeon: Matilda Senior, MD;  Location: [facility];  Service: Urology;  Laterality: Left;  90 MINS   CYSTOSCOPY WITH RETROGRADE  PYELOGRAM, URETEROSCOPY AND STENT PLACEMENT Left 11/17/2019   Procedure: CYSTOSCOPY WITH RETROGRADE PYELOGRAM, URETEROSCOPY AND STENT PLACEMENT;  Surgeon: Matilda Senior, MD;  Location: [facility];  Service: Urology;  Laterality: Left;  90 MINS   CYSTOSCOPY WITH RETROGRADE PYELOGRAM, URETEROSCOPY AND STENT PLACEMENT Left 05/10/2020   Procedure: CYSTOSCOPY WITH RETROGRADE PYELOGRAM, URETEROSCOPY AND STENT PLACEMENT WITH URETHRAL DIALATION AND BRUSH BIOPSY;  Surgeon: Matilda Senior, MD;  Location: [facility];  Service: Urology;  Laterality: Left;  1 HR   CYSTOSCOPY WITH RETROGRADE PYELOGRAM, URETEROSCOPY AND STENT PLACEMENT Left 09/16/2020   Procedure: CYSTOSCOPY WITH RETROGRADE PYELOGRAM, URETEROSCOPY ,  LITHOPAXY, LEFT URETERAL BRUSHING, AND STENT REPLACEMENT;  Surgeon: Matilda Senior, MD;  Location: [facility];  Service: Urology;  Laterality: Left;   CYSTOSCOPY WITH RETROGRADE PYELOGRAM, URETEROSCOPY AND STENT PLACEMENT Left 10/24/2021   Procedure: CYSTOSCOPY WITH RETROGRADE PYELOGRAM, URETEROSCOPY, POSSIBLE URETERAL AND RENAL BIOPSIES AND STENT PLACEMENT;  Surgeon: Matilda Senior, MD;  Location: [facility];  Service: Urology;  Laterality: Left;  1 HR   CYSTOSCOPY WITH RETROGRADE PYELOGRAM, URETEROSCOPY AND STENT PLACEMENT Left 03/09/2022   Procedure: CYSTOSCOPY WITH ANTEGRADE PYELOGRAM,  STENT REMOVAL;  Surgeon: Matilda Senior, MD;  Location: [facility];  Service: Urology;  Laterality: Left;   CYSTOSCOPY WITH RETROGRADE PYELOGRAM, URETEROSCOPY AND STENT PLACEMENT  01/09/2007   @[facility]  by dr matilda;    bx's/ washing  bladder , right ureter   CYSTOSCOPY WITH RETROGRADE PYELOGRAM, URETEROSCOPY AND STENT PLACEMENT Left 11/16/2022   Procedure: CYSTOSCOPY WITH RETROGRADE PYELOGRAM, URETEROSCOPY AND STENT REPLACEMENT;  Surgeon: Matilda Senior, MD;  Location: [facility];  Service: Urology;   Laterality: Left;  90 MINS   CYSTOSCOPY WITH RETROGRADE PYELOGRAM, URETEROSCOPY AND STENT PLACEMENT Left 07/06/2023   Procedure: CYSTOSCOPY WITH LEFT RETROGRADE PYELOGRAM, URETEROSCOPY, LASER ABLATION OF TUMOR AND STENT EXCHANGE;  Surgeon: Alvaro Ricardo KATHEE Mickey., MD;  Location: [facility];  Service: Urology;  Laterality: Left;  60 MINUTES NEEDED FOR CASE   CYSTOSCOPY/URETEROSCOPY/HOLMIUM LASER Left 12/04/2022   Procedure: CYSTOSCOPY/URETEROSCOPY/  HOLMIUM LASER OF RENAL PELVIC LESIONS;  Surgeon: Matilda Senior, MD;  Location: [facility];  Service: Urology;  Laterality: Left;   CYSTOSCOPY/URETEROSCOPY/HOLMIUM LASER/STENT PLACEMENT Left 05/02/2021   Procedure: CYSTOSCOPY/URETEROSCOPY WITH BRUSH BIOPSY/ RETROGRADE PYELOGRAM/ HOLMIUM LASER/STENT REPLACEMENT;  Surgeon: Matilda Senior, MD;  Location: [facility];  Service: Urology;  Laterality: Left;   CYSTOSCOPY/URETEROSCOPY/HOLMIUM LASER/STENT PLACEMENT Left 01/30/2024   Procedure: CYSTOSCOPY/DIAGNOSTIC URETEROSCOPY/ RETROGRADE/STENT EXCHANGE;  Surgeon: Alvaro Ricardo KATHEE Mickey., MD;  Location: [facility];  Service: Urology;  Laterality: Left;   CYSTOSCOPY/URETEROSCOPY/HOLMIUM LASER/STENT PLACEMENT Left 08/14/2024   Procedure: CYSTOSCOPY/DIAGNOSTIC URETEROSCOPY/STENT EXCHANGE;  Surgeon: Alvaro Ricardo KATHEE Mickey., MD;  Location: [facility];  Service: Urology;  Laterality: Left;   HOLMIUM LASER APPLICATION Left 09/16/2020   Procedure: HOLMIUM LASER APPLICATION;  Surgeon: Matilda Senior, MD;  Location: [facility];  Service: Urology;  Laterality: Left;  HOLMIUM LASER APPLICATION Left 10/24/2021   Procedure: HOLMIUM LASER APPLICATION OF TUMORS;  Surgeon: Matilda Senior, MD;  Location: [facility];  Service: Urology;  Laterality: Left;   HOLMIUM LASER APPLICATION Left 11/16/2022   Procedure: HOLMIUM LASER OF TUMORS;  Surgeon: Matilda Senior, MD;  Location: [facility];  Service: Urology;   Laterality: Left;   IR NEPHROSTOMY PLACEMENT LEFT  07/06/2021   IR NEPHROSTOMY PLACEMENT LEFT  02/06/2022   NEPHROSTOMY TUBE REMOVAL Left 03/23/2022   Procedure: NEPHROSTOMY TUBE REMOVAL;  Surgeon: Matilda Senior, MD;  Location: [facility];  Service: Urology;  Laterality: Left;   NEPHROURETERECTOMY Right 02/06/2007   @[facility]  by dr matilda;   Laparoscopic   RIGHT/LEFT HEART CATH AND CORONARY ANGIOGRAPHY N/A 11/06/2019   Procedure: RIGHT/LEFT HEART CATH AND CORONARY ANGIOGRAPHY;  Surgeon: Claudene Victory ORN, MD;  Location: [facility];  Service: Cardiovascular;  Laterality: N/A;   THULIUM LASER TURP (TRANSURETHRAL RESECTION OF PROSTATE) Left 08/18/2019   Procedure: THULIUM LASER ABLATION OF URETERAL TUMOR;  Surgeon: Matilda Senior, MD;  Location: [facility];  Service: Urology;  Laterality: Left;   THULIUM LASER TURP (TRANSURETHRAL RESECTION OF PROSTATE) Left 11/17/2019   Procedure: THULIUM LASER of URETERAL CANCER;  Surgeon: Matilda Senior, MD;  Location: [facility];  Service: Urology;  Laterality: Left;   TRANSURETHRAL RESECTION OF BLADDER TUMOR  12/12/2006   @[facility]  by dr andra   VAGINAL HYSTERECTOMY  1980    SOCIAL HISTORY Social History   Tobacco Use   Smoking status: Former    Current packs/day: 0.00    Average packs/day: 0.5 packs/day for 38.0 years (19.0 ttl pk-yrs)    Types: Cigarettes    Start date: 11/25/1965    Quit date: 11/26/2003    Years since quitting: 20.7   Smokeless tobacco: Never  Vaping Use   Vaping status: Never Used  Substance Use Topics   Alcohol use: Not Currently   Drug use: Never    Social History   Social History Narrative   Not on file    SOCIAL DRIVERS OF HEALTH SDOH Screenings   Food Insecurity: No Food Insecurity (07/29/2024)  Housing: Low Risk  (07/29/2024)  Transportation Needs: No Transportation Needs (07/29/2024)  Utilities: Not At Risk (07/29/2024)  Social Connections: Socially Integrated  (07/29/2024)  Tobacco Use: Medium Risk (08/14/2024)     FAMILY HISTORY Family History  Problem Relation Age of Onset   Hypertension Mother    Diabetes Mellitus II Sister    Hypertension Sister    Hypertension Brother    BRCA 1/2 Neg Hx    Breast cancer Neg Hx      ALLERGIES: is allergic to erythromycin, lotensin [benazepril hcl], and macrodantin [nitrofurantoin].  MEDICATIONS  Current Outpatient Medications  Medication Sig Dispense Refill   acetaminophen  (TYLENOL ) 500 MG tablet Take 500-1,000 mg by mouth every 6 (six) hours as needed for mild pain.     aspirin  EC 81 MG tablet Take 1 tablet (81 mg total) by mouth daily. Swallow whole. (Patient not taking: Reported on 08/11/2024) 90 tablet 3   Calcium  Carbonate Antacid (TUMS PO) Take 1 tablet by mouth daily as needed (heartburn).     carvedilol  (COREG ) 6.25 MG tablet TAKE 1 TABLET(6.25 MG) BY MOUTH TWICE DAILY WITH A MEAL 180 tablet 3   cholecalciferol  (VITAMIN D3) 25 MCG (1000 UNIT) tablet Take 1,000 Units by mouth every morning. (Patient not taking: Reported on 08/11/2024)     ciprofloxacin  (CIPRO ) 500 MG tablet  Take 1 tablet (500 mg total) by mouth daily. X 3 days to prevent post-op infection. 3 tablet 0   Docusate Calcium  (STOOL SOFTENER PO) Take 1 capsule by mouth daily as needed.     Famotidine  (HEARTBURN RELIEF PO) Take 1 tablet by mouth daily as needed (heartburn).     Ferrous Sulfate  (IRON  PO) Take 1 tablet by mouth in the morning.     ferrous sulfate  325 (65 FE) MG tablet Take 325 mg by mouth daily with breakfast.     icosapent  Ethyl (VASCEPA ) 1 g capsule Take 1 capsule (1 g total) by mouth 2 (two) times daily. 180 capsule 2   icosapent  Ethyl (VASCEPA ) 1 g capsule Take 1 g by mouth 2 (two) times daily.     iron  polysaccharides (NIFEREX) 150 MG capsule Take 1 capsule (150 mg total) by mouth daily. (Patient not taking: Reported on 08/11/2024) 30 capsule 0   isosorbide  mononitrate (IMDUR ) 30 MG 24 hr tablet TAKE 1/2 TABLET(15 MG)  BY MOUTH DAILY (Patient taking differently: Take 15 mg by mouth daily.) 45 tablet 3   rosuvastatin  (CRESTOR ) 40 MG tablet TAKE 1 TABLET(40 MG) BY MOUTH DAILY (Patient taking differently: Take 40 mg by mouth daily.) 90 tablet 2   sertraline  (ZOLOFT ) 50 MG tablet Take 50 mg by mouth every morning.     traMADol  (ULTRAM ) 50 MG tablet Take 1-2 tablets (50-100 mg total) by mouth every 6 (six) hours as needed. For post-op pain. 15 tablet 0   traZODone  (DESYREL ) 150 MG tablet Take 150 mg by mouth at bedtime.     No current facility-administered medications for this visit.    PHYSICAL EXAMINATION: ECOG PERFORMANCE STATUS: 1 - Symptomatic but completely ambulatory VITALS: Vitals:   08/27/24 1154  BP: 125/60  Pulse: 68  Resp: 20  Temp: (!) 97.3 F (36.3 C)  SpO2: 95%   Filed Weights   08/27/24 1154  Weight: 172 lb 9.6 oz (78.3 kg)   Body mass index is 30.57 kg/m.  GENERAL: alert, in no acute distress and comfortable SKIN: no acute rashes, no significant lesions EYES: conjunctiva are pink and non-injected, sclera anicteric OROPHARYNX: MMM, no exudates, no oropharyngeal erythema or ulceration NECK: supple, no JVD LYMPH:  no palpable lymphadenopathy in the cervical, axillary or inguinal regions LUNGS: clear to auscultation b/l with normal respiratory effort HEART: regular rate & rhythm ABDOMEN:  normoactive bowel sounds , non tender, not distended, no hepatosplenomegaly Extremity: no pedal edema PSYCH: alert & oriented x 3 with fluent speech NEURO: no focal motor/sensory deficits  LABORATORY DATA:   I have reviewed the data as listed     Latest Ref Rng & Units 08/27/2024   11:01 AM 08/05/2024    5:12 AM 08/04/2024    5:12 AM  CBC EXTENDED  WBC 4.0 - 10.5 K/uL 9.9  8.6  7.9   RBC 3.87 - 5.11 MIL/uL 3.07  2.95  2.70   Hemoglobin 12.0 - 15.0 g/dL 9.0  8.7  7.8   HCT 63.9 - 46.0 % 28.2  28.0  26.3   Platelets 150 - 400 K/uL 237  185  178   NEUT# 1.7 - 7.7 K/uL 7.2  5.6  4.2    Lymph# 0.7 - 4.0 K/uL 1.8  1.8  2.5       Latest Ref Rng & Units 08/27/2024   11:01 AM 08/14/2024   11:46 AM 08/05/2024    5:12 AM  CMP  Glucose 70 - 99 mg/dL 862  893  93   BUN 8 - 23 mg/dL 65  65  41   Creatinine 0.44 - 1.00 mg/dL 3.69  3.31  5.58   Sodium 135 - 145 mmol/L 135  133  142   Potassium 3.5 - 5.1 mmol/L 4.6  4.8  3.4   Chloride 98 - 111 mmol/L 102  102  111   CO2 22 - 32 mmol/L 19  16  20    Calcium  8.9 - 10.3 mg/dL 9.0  8.7  8.8   Total Protein 6.5 - 8.1 g/dL 7.8     Total Bilirubin 0.0 - 1.2 mg/dL <9.7     Alkaline Phos 38 - 126 U/L 67     AST 15 - 41 U/L 17     ALT 0 - 44 U/L 14        RADIOGRAPHIC STUDIES: I have personally reviewed the radiological images as listed and agreed with the findings in the report. DG C-Arm 1-60 Min-No Report Result Date: 08/14/2024 Fluoroscopy was utilized by the requesting physician.  No radiographic interpretation.   CT ABDOMEN PELVIS WO CONTRAST Result Date: 07/29/2024 CLINICAL DATA:  Nausea and vomiting for 1 week, fever, prior right nephrectomy, indwelling left ureteral stent, history of left ureteral cancer EXAM: CT ABDOMEN AND PELVIS WITHOUT CONTRAST TECHNIQUE: Multidetector CT imaging of the abdomen and pelvis was performed following the standard protocol without IV contrast. RADIATION DOSE REDUCTION: This exam was performed according to the departmental dose-optimization program which includes automated exposure control, adjustment of the mA and/or kV according to patient size and/or use of iterative reconstruction technique. COMPARISON:  03/27/2024 FINDINGS: Lower chest: No acute pleural or parenchymal lung disease. Small hiatal hernia. Hepatobiliary: Unremarkable unenhanced appearance of the liver and gallbladder. No biliary duct dilation. Pancreas: Unremarkable unenhanced appearance. Spleen: Unremarkable unenhanced appearance. Adrenals/Urinary Tract: The adrenals are unremarkable. Prior right nephro ureterectomy with no  abnormalities in the nephrectomy bed. There is an indwelling left ureteral stent extending from the left renal pelvis into the bladder lumen. No urinary tract calculi or obstructive uropathy. There is mild fat stranding surrounding the left renal pelvis and left ureter, which could be related to infection or patient's known history of neoplasm. The bladder is unremarkable. Stomach/Bowel: No bowel obstruction or ileus. Normal appendix right lower quadrant. Distal colonic diverticulosis without diverticulitis. No bowel wall thickening or inflammatory change. Small hiatal hernia. Vascular/Lymphatic: Stable aortic atherosclerosis. Assessment of the vascular lumen is limited without intravenous contrast. Multiple enlarged retroperitoneal lymph nodes are again identified, largest in the left para-aortic region measuring 11 mm in short axis, reference image 45/2, stable. No new adenopathy. Reproductive: Status post hysterectomy. No adnexal masses. Other: No free fluid or free intraperitoneal gas. No abdominal wall hernia. Musculoskeletal: No acute or destructive bony abnormalities. Reconstructed images demonstrate no additional findings. IMPRESSION: 1. Indwelling left ureteral stent as above, with mild fat stranding surrounding the left renal pelvis and left ureter consistent with known history of neoplasm and/or infection. Please correlate with urinalysis. 2. Persistent left para-aortic lymphadenopathy, concerning for metastatic disease. 3. Distal colonic diverticulosis without diverticulitis. 4. Prior right nephro ureterectomy. 5.  Aortic Atherosclerosis (ICD10-I70.0). Electronically Signed   By: Ozell Daring M.D.   On: 07/29/2024 15:30     ASSESSMENT & PLAN:  78 y.o. female with  1.   Left ureteral cancer diagnosed in September 2020.her tumor appears to be superficial although no documentation of muscle sampling on cytology.    She is status post right nephroureterectomy for a distal ureteral carcinoma  in 2008    She is status post cystoscopy and a stent placement in September 2020 and repeated in February 2021 for a left ureteral tumor.  The cytology showed high-grade urothelial carcinoma.   She is status post BCG treatment in 2021.  Subsequent cystoscopy in 2022 showed atypia without obvious urothelial carcinoma.  In July 2022 cytology showed high-grade urothelial carcinoma.   She is status post Jelmyto  instillation under the care of Dr. Matilda in September 2022 was completed in November 2022.  Following cystoscopy in January 2023 did show some residual tumor by cytology.   In May 2023 she completed repeat Jelmyto  infusion.    2.  Anemia: Appears to be improving at this time with her symptoms of dizziness and fatigue is improved.   3.  Chronic renal insufficiency: Creatinine clearance is around 20 cc/min likely contributing to her anemia.   PLAN: - Discussed lab results on 08/27/2024 in detail with patient: CBC showed WBC of 9.9K, Hemoglobin of 9.0 increased from 8.7, and PLTs of 237K. CMP -creatinine remains elevated at 6.3. -continue f/u with urology and nephrology (Dr Dolan) closely. No new abdominal pain. Will get interval PET/CT to evaluate brolderline abdominal LN's to determine if these are reactive vs urothelial carcinoma progression.   FOLLOW-UP Pet/ct in 8 weeks RTC with Dr Onesimo with labs in 10 weeks  The total time spent in the appointment was 23 minutes* .  All of the patient's questions were answered and the patient knows to call the clinic with any problems, questions, or concerns.  Emaline Onesimo MD MS AAHIVMS [facility] Hematology/Oncology Physician [facility]  *Total Encounter Time as defined by the Centers for Medicare and Medicaid Services includes, in addition to the face-to-face time of a patient visit (documented in the note above) non-face-to-face time: obtaining and reviewing outside history, ordering and reviewing medications, tests or procedures,  care coordination (communications with other health care professionals or caregivers) and documentation in the medical record.  I,Emily Lagle,acting as a neurosurgeon for Emaline Onesimo, MD.,have documented all relevant documentation on the behalf of Emaline Onesimo, MD,as directed by  Emaline Onesimo, MD while in the presence of Emaline Onesimo, MD.  I have reviewed the above documentation for accuracy and completeness, and I agree with the above.  Nailea Whitehorn, MD

## 2024-09-01 DIAGNOSIS — Z905 Acquired absence of kidney: Secondary | ICD-10-CM | POA: Diagnosis not present

## 2024-09-01 DIAGNOSIS — N135 Crossing vessel and stricture of ureter without hydronephrosis: Secondary | ICD-10-CM | POA: Diagnosis not present

## 2024-09-01 DIAGNOSIS — C652 Malignant neoplasm of left renal pelvis: Secondary | ICD-10-CM | POA: Diagnosis not present

## 2024-09-17 ENCOUNTER — Inpatient Hospital Stay (HOSPITAL_COMMUNITY)
Admission: EM | Admit: 2024-09-17 | Discharge: 2024-09-22 | DRG: 674 | Disposition: A | Attending: Family Medicine | Admitting: Family Medicine

## 2024-09-17 ENCOUNTER — Emergency Department (HOSPITAL_COMMUNITY)

## 2024-09-17 ENCOUNTER — Other Ambulatory Visit: Payer: Self-pay

## 2024-09-17 DIAGNOSIS — G4733 Obstructive sleep apnea (adult) (pediatric): Secondary | ICD-10-CM | POA: Diagnosis present

## 2024-09-17 DIAGNOSIS — F32A Depression, unspecified: Secondary | ICD-10-CM | POA: Diagnosis present

## 2024-09-17 DIAGNOSIS — E869 Volume depletion, unspecified: Secondary | ICD-10-CM | POA: Diagnosis present

## 2024-09-17 DIAGNOSIS — Z87891 Personal history of nicotine dependence: Secondary | ICD-10-CM | POA: Diagnosis not present

## 2024-09-17 DIAGNOSIS — G4700 Insomnia, unspecified: Secondary | ICD-10-CM | POA: Diagnosis present

## 2024-09-17 DIAGNOSIS — N390 Urinary tract infection, site not specified: Secondary | ICD-10-CM | POA: Diagnosis present

## 2024-09-17 DIAGNOSIS — N186 End stage renal disease: Secondary | ICD-10-CM | POA: Diagnosis present

## 2024-09-17 DIAGNOSIS — Z79899 Other long term (current) drug therapy: Secondary | ICD-10-CM | POA: Diagnosis not present

## 2024-09-17 DIAGNOSIS — D631 Anemia in chronic kidney disease: Secondary | ICD-10-CM | POA: Diagnosis present

## 2024-09-17 DIAGNOSIS — Z8249 Family history of ischemic heart disease and other diseases of the circulatory system: Secondary | ICD-10-CM | POA: Diagnosis not present

## 2024-09-17 DIAGNOSIS — I132 Hypertensive heart and chronic kidney disease with heart failure and with stage 5 chronic kidney disease, or end stage renal disease: Secondary | ICD-10-CM | POA: Diagnosis present

## 2024-09-17 DIAGNOSIS — C662 Malignant neoplasm of left ureter: Secondary | ICD-10-CM | POA: Diagnosis present

## 2024-09-17 DIAGNOSIS — I5042 Chronic combined systolic (congestive) and diastolic (congestive) heart failure: Secondary | ICD-10-CM | POA: Diagnosis present

## 2024-09-17 DIAGNOSIS — N179 Acute kidney failure, unspecified: Secondary | ICD-10-CM | POA: Diagnosis not present

## 2024-09-17 DIAGNOSIS — Z905 Acquired absence of kidney: Secondary | ICD-10-CM | POA: Diagnosis not present

## 2024-09-17 DIAGNOSIS — Z7982 Long term (current) use of aspirin: Secondary | ICD-10-CM | POA: Diagnosis not present

## 2024-09-17 DIAGNOSIS — E782 Mixed hyperlipidemia: Secondary | ICD-10-CM | POA: Diagnosis present

## 2024-09-17 DIAGNOSIS — E8721 Acute metabolic acidosis: Secondary | ICD-10-CM | POA: Diagnosis not present

## 2024-09-17 DIAGNOSIS — I2510 Atherosclerotic heart disease of native coronary artery without angina pectoris: Secondary | ICD-10-CM | POA: Diagnosis present

## 2024-09-17 DIAGNOSIS — Z833 Family history of diabetes mellitus: Secondary | ICD-10-CM | POA: Diagnosis not present

## 2024-09-17 DIAGNOSIS — K5909 Other constipation: Secondary | ICD-10-CM | POA: Diagnosis present

## 2024-09-17 DIAGNOSIS — E860 Dehydration: Secondary | ICD-10-CM | POA: Diagnosis present

## 2024-09-17 DIAGNOSIS — C679 Malignant neoplasm of bladder, unspecified: Secondary | ICD-10-CM | POA: Diagnosis present

## 2024-09-17 DIAGNOSIS — E871 Hypo-osmolality and hyponatremia: Secondary | ICD-10-CM | POA: Diagnosis not present

## 2024-09-17 LAB — CBC WITH DIFFERENTIAL/PLATELET
Abs Immature Granulocytes: 0.09 K/uL — ABNORMAL HIGH (ref 0.00–0.07)
Basophils Absolute: 0.1 K/uL (ref 0.0–0.1)
Basophils Relative: 0 %
Eosinophils Absolute: 0.2 K/uL (ref 0.0–0.5)
Eosinophils Relative: 1 %
HCT: 25.9 % — ABNORMAL LOW (ref 36.0–46.0)
Hemoglobin: 8.3 g/dL — ABNORMAL LOW (ref 12.0–15.0)
Immature Granulocytes: 1 %
Lymphocytes Relative: 12 %
Lymphs Abs: 1.8 K/uL (ref 0.7–4.0)
MCH: 29.3 pg (ref 26.0–34.0)
MCHC: 32 g/dL (ref 30.0–36.0)
MCV: 91.5 fL (ref 80.0–100.0)
Monocytes Absolute: 0.9 K/uL (ref 0.1–1.0)
Monocytes Relative: 6 %
Neutro Abs: 12.3 K/uL — ABNORMAL HIGH (ref 1.7–7.7)
Neutrophils Relative %: 80 %
Platelets: 266 K/uL (ref 150–400)
RBC: 2.83 MIL/uL — ABNORMAL LOW (ref 3.87–5.11)
RDW: 14.8 % (ref 11.5–15.5)
WBC: 15.3 K/uL — ABNORMAL HIGH (ref 4.0–10.5)
nRBC: 0 % (ref 0.0–0.2)

## 2024-09-17 LAB — URINALYSIS, ROUTINE W REFLEX MICROSCOPIC
Bilirubin Urine: NEGATIVE
Glucose, UA: NEGATIVE mg/dL
Ketones, ur: NEGATIVE mg/dL
Nitrite: NEGATIVE
Protein, ur: 100 mg/dL — AB
Specific Gravity, Urine: 1.02 (ref 1.005–1.030)
pH: 6 (ref 5.0–8.0)

## 2024-09-17 LAB — URINALYSIS, MICROSCOPIC (REFLEX): WBC, UA: >50 WBC/hpf (ref 0–5)

## 2024-09-17 LAB — COMPREHENSIVE METABOLIC PANEL WITH GFR
ALT: 11 U/L (ref 0–44)
AST: 14 U/L — ABNORMAL LOW (ref 15–41)
Albumin: 3 g/dL — ABNORMAL LOW (ref 3.5–5.0)
Alkaline Phosphatase: 65 U/L (ref 38–126)
Anion gap: 12 (ref 5–15)
BUN: 105 mg/dL — ABNORMAL HIGH (ref 8–23)
CO2: 17 mmol/L — ABNORMAL LOW (ref 22–32)
Calcium: 8.5 mg/dL — ABNORMAL LOW (ref 8.9–10.3)
Chloride: 106 mmol/L (ref 98–111)
Creatinine, Ser: 10.06 mg/dL — ABNORMAL HIGH (ref 0.44–1.00)
GFR, Estimated: 4 mL/min — ABNORMAL LOW (ref >60)
Glucose, Bld: 125 mg/dL — ABNORMAL HIGH (ref 70–99)
Potassium: 4.6 mmol/L (ref 3.5–5.1)
Sodium: 135 mmol/L (ref 135–145)
Total Bilirubin: 0.5 mg/dL (ref 0.0–1.2)
Total Protein: 8.2 g/dL — ABNORMAL HIGH (ref 6.5–8.1)

## 2024-09-17 LAB — LIPASE, BLOOD: Lipase: 34 U/L (ref 11–51)

## 2024-09-17 MED ORDER — HYDRALAZINE HCL 20 MG/ML IJ SOLN
20.0000 mg | INTRAMUSCULAR | Status: DC | PRN
  Administered 2024-09-17 – 2024-09-19 (×2): 20 mg via INTRAVENOUS
  Filled 2024-09-17 (×2): qty 1

## 2024-09-17 MED ORDER — ENOXAPARIN SODIUM 40 MG/0.4ML IJ SOSY: 40.0000 mg | PREFILLED_SYRINGE | INTRAMUSCULAR | Status: DC

## 2024-09-17 MED ORDER — SODIUM CHLORIDE 0.9 % IV SOLN
2.0000 g | INTRAVENOUS | Status: DC
  Administered 2024-09-18 – 2024-09-19 (×2): 2 g via INTRAVENOUS
  Filled 2024-09-17 (×2): qty 20

## 2024-09-17 MED ORDER — LACTATED RINGERS IV BOLUS
1000.0000 mL | Freq: Once | INTRAVENOUS | Status: AC
  Administered 2024-09-17: 1000 mL via INTRAVENOUS

## 2024-09-17 MED ORDER — ONDANSETRON HCL 4 MG/2ML IJ SOLN
4.0000 mg | Freq: Four times a day (QID) | INTRAMUSCULAR | Status: DC | PRN
  Administered 2024-09-19 – 2024-09-20 (×4): 4 mg via INTRAVENOUS
  Filled 2024-09-17 (×4): qty 2

## 2024-09-17 MED ORDER — LACTATED RINGERS IV SOLN: INTRAVENOUS | Status: AC

## 2024-09-17 MED ORDER — HEPARIN SODIUM (PORCINE) 5000 UNIT/ML IJ SOLN
5000.0000 [IU] | Freq: Three times a day (TID) | INTRAMUSCULAR | Status: DC
  Administered 2024-09-17 – 2024-09-22 (×14): 5000 [IU] via SUBCUTANEOUS
  Filled 2024-09-17 (×14): qty 1

## 2024-09-17 MED ORDER — ACETAMINOPHEN 325 MG PO TABS
650.0000 mg | ORAL_TABLET | Freq: Four times a day (QID) | ORAL | Status: DC | PRN
  Administered 2024-09-17 – 2024-09-21 (×5): 650 mg via ORAL
  Filled 2024-09-17 (×5): qty 2

## 2024-09-17 MED ORDER — ONDANSETRON HCL 4 MG PO TABS
4.0000 mg | ORAL_TABLET | Freq: Four times a day (QID) | ORAL | Status: DC | PRN
  Administered 2024-09-17: 4 mg via ORAL
  Filled 2024-09-17: qty 1

## 2024-09-17 MED ORDER — ACETAMINOPHEN 650 MG RE SUPP: 650.0000 mg | Freq: Four times a day (QID) | RECTAL | Status: DC | PRN

## 2024-09-17 MED ORDER — SODIUM CHLORIDE 0.9 % IV SOLN
1.0000 g | Freq: Once | INTRAVENOUS | Status: AC
  Administered 2024-09-18: 1 g via INTRAVENOUS
  Filled 2024-09-17: qty 10

## 2024-09-17 NOTE — Consult Note (Addendum)
 Reason for Consult: AKI/CKD Stage IV Referring Physician: Ellouise, DO  [name] is an 78 y.o. 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 Dolan after her Scr was noted to be elevated at 7.21.  She also has a 3 week history of nausea, vomiting, and poor po intake as well as left lower quadrant abdominal pain and chills.  In the Ed, Temp 97.4, Bp 148/73, HR 69, RR 22, SpO2 91% on room air.  Labs notable for WBC 15.3, Hgb 8.3, BUN 105, Cr 10.06, Co2 17, K 4.6, Na 135, Ca 8.5, alb 3.  UA with large blood, 100 protein, mod leukocytes, many bacteria, nitrite negative.  CT scan of abdomen and pelvis showed left ureteral stent in proper position without hydronephrosis but did show stranding of the periureteral, pericollecting system, and perirenal areas.  We were consulted to further evaluate and manage her recurrent AKI/CKD stage IV.  The trend in Scr is seen below.   She did have an episode of AKI/CKD in October and was given IVF's.  The trend in Scr is seen below.  Given her refractory N/V/anorexia and AKI/CKD stage IV, she will require admission to the hospitalist service and initiate IVF's as below.  Trend in Creatinine:  Creatinine, Ser  Date/Time Value Ref Range Status  09/17/2024 02:22 PM 10.06 (H) 0.44 - 1.00 mg/dL Final  87/95/7974 2.78 (H)    08/27/2024 11:01 AM 6.30 (H) 0.44 - 1.00 mg/dL Final  88/93/7974 88:53 AM 6.68 (H) 0.44 - 1.00 mg/dL Final  89/69/7974 4.46 (H)    08/05/2024 05:12 AM 4.41 (H) 0.44 - 1.00 mg/dL Final  89/72/7974 94:87 AM 4.29 (H) 0.44 - 1.00 mg/dL Final  89/72/7974 94:87 AM 4.35 (H) 0.44 - 1.00 mg/dL Final  89/73/7974 94:52 AM 4.36 (H) 0.44 - 1.00 mg/dL Final  89/74/7974 94:45 AM 4.64 (H) 0.44 - 1.00 mg/dL Final  89/75/7974  94:69 AM 4.68 (H) 0.44 - 1.00 mg/dL Final  89/76/7974 91:71 AM 4.97 (H) 0.44 - 1.00 mg/dL Final  89/77/7974 94:80 AM 5.66 (H) 0.44 - 1.00 mg/dL Final  89/78/7974 98:64 PM 5.77 (H) 0.44 - 1.00 mg/dL Final  89/78/7974 90:52 AM 6.45 (H) 0.44 - 1.00 mg/dL Final  93/73/7974 94:81 AM 3.48 (H) 0.44 - 1.00 mg/dL Final  93/74/7974 94:67 AM 4.31 (H) 0.44 - 1.00 mg/dL Final  93/75/7974 94:69 AM 5.34 (H) 0.44 - 1.00 mg/dL Final  93/76/7974 94:55 AM 6.00 (H) 0.44 - 1.00 mg/dL Final  93/77/7974 93:59 AM 6.31 (H) 0.44 - 1.00 mg/dL Final  93/78/7974 91:72 AM 6.36 (H) 0.44 - 1.00 mg/dL Final  93/79/7974 94:70 AM 7.04 (H) 0.44 - 1.00 mg/dL Final  93/80/7974 88:54 PM 7.13 (H) 0.44 - 1.00 mg/dL Final  93/80/7974 95:55 PM 7.87 (H) 0.44 - 1.00 mg/dL Final  95/84/7974 90:80 AM 3.15 (H) 0.44 - 1.00 mg/dL Final  88/98/7975 92:71 AM 3.53 (H) 0.44 - 1.00 mg/dL Final  89/68/7975 91:59 AM 3.85 (H) 0.44 - 1.00 mg/dL Final  89/69/7975 96:48 AM 4.20 (H) 0.44 - 1.00 mg/dL Final  89/70/7975 95:97 AM 4.64 (H) 0.44 - 1.00 mg/dL Final  89/71/7975 95:84 AM 4.98 (H) 0.44 - 1.00 mg/dL Final  89/72/7975 95:66 AM 5.24 (H) 0.44 - 1.00 mg/dL Final  89/73/7975 95:79 AM 5.54 (H)  0.44 - 1.00 mg/dL Final  89/74/7975 95:99 AM 5.81 (H) 0.44 - 1.00 mg/dL Final  89/75/7975 89:66 PM 6.00 (H) 0.44 - 1.00 mg/dL Final  89/75/7975 95:66 PM 6.13 (H) 0.44 - 1.00 mg/dL Final  97/73/7975 90:50 AM 2.40 (H) 0.44 - 1.00 mg/dL Final  97/90/7975 96:56 AM 2.36 (H) 0.44 - 1.00 mg/dL Final  97/91/7975 89:73 AM 2.48 (H) 0.44 - 1.00 mg/dL Final  97/91/7975 93:84 AM 2.90 (H) 0.44 - 1.00 mg/dL Final  90/96/7976 94:57 AM 2.48 (H) 0.44 - 1.00 mg/dL Final  90/97/7976 94:73 AM 2.71 (H) 0.44 - 1.00 mg/dL Final  90/98/7976 93:91 AM 3.37 (H) 0.44 - 1.00 mg/dL Final  91/68/7976 94:79 AM 3.53 (H) 0.44 - 1.00 mg/dL Final  91/69/7976 94:60 AM 3.64 (H) 0.44 - 1.00 mg/dL Final  91/70/7976 93:80 AM 3.54 (H) 0.44 - 1.00 mg/dL Final  91/71/7976 94:99 AM 3.58 (H) 0.44 -  1.00 mg/dL Final  91/72/7976 91:79 AM 3.11 (H) 0.44 - 1.00 mg/dL Final  91/73/7976 93:93 AM 3.09 (H) 0.44 - 1.00 mg/dL Final  91/74/7976 94:75 AM 2.91 (H) 0.44 - 1.00 mg/dL Final  91/75/7976 93:98 AM 2.83 (H) 0.44 - 1.00 mg/dL Final  91/76/7976 93:73 AM 2.57 (H) 0.44 - 1.00 mg/dL Final  91/77/7976 89:99 AM 3.17 (H) 0.44 - 1.00 mg/dL Final  91/91/7976 87:73 AM 1.43 (H) 0.44 - 1.00 mg/dL Final  91/92/7976 95:82 PM 1.44 (H) 0.44 - 1.00 mg/dL Final  92/80/7976 95:74 AM 1.40 (H) 0.44 - 1.00 mg/dL Final  92/81/7976 95:62 AM 1.38 (H) 0.44 - 1.00 mg/dL Final    PMH:   Past Medical History:  Diagnosis Date   Anemia associated with chronic renal failure    Arthritis    Blood dyscrasia 2008   hx of PE   CAD in native artery 10/2019   cardiologist-   dr christella. santo;   a. 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   urologist--- dr dahlstedt/  oncologist--- dr amadeo;   dx 09/ 2020 high grade urothelial  s/p laser ablation, ;  recurrent s/p BCG instillation,  completed chemo instilation 11/ 2022  and repeat chemo completed 05/ 2023   Chronic combined systolic and diastolic CHF (congestive heart failure) (HCC) 06/2019   followed by cardiology;   dx 09/ 2022 in setting of sepsis, pulm edema;   05/ 2022  ef 40-45% per echo,  recovered per cath 01/ 2021 ef 50%;  laste echo 08/ 2023  ef 60-65%   CKD (chronic kidney disease), stage III (HCC)    Depression    Dysrhythmia    bradycardia   GAD (generalized anxiety disorder)    GERD (gastroesophageal reflux disease)    History of bladder cancer 12/2006   followed by dr matilda   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   dx 04/ 2008  w/ poor function hydronephrotic  right kidney;   02-06-2007  s/p right nephroureterectomy   History of kidney stones    History of pulmonary embolism 04/2022   in setting severe sepsis;  small RLL, treated w/ 3 months  eliquis    Hyperlipidemia, mixed    Hypertension    Solitary kidney, acquired 02/06/2007   s/p  right nephroureterectomy for cancer    PSH:   Past Surgical History:  Procedure Laterality Date   CATARACT EXTRACTION W/ INTRAOCULAR LENS IMPLANT Bilateral 2011   CYSTOSCOPY W/ RETROGRADES Left 01/30/2024  Procedure: CYSTOSCOPY, WITH RETROGRADE PYELOGRAM;  Surgeon: Alvaro Ricardo KATHEE Mickey., MD;  Location: [facility];  Service: Urology;  Laterality: Left;   CYSTOSCOPY W/ RETROGRADES Left 08/14/2024   Procedure: CYSTOSCOPY, WITH RETROGRADE PYELOGRAM;  Surgeon: Alvaro Ricardo KATHEE Mickey., MD;  Location: [facility];  Service: Urology;  Laterality: Left;   CYSTOSCOPY W/ URETERAL STENT PLACEMENT Left 11/17/2019   Procedure: CYSTOSCOPY WITH STENT REPLACEMENT;  Surgeon: Matilda Senior, MD;  Location: [facility];  Service: Urology;  Laterality: Left;   CYSTOSCOPY W/ URETERAL STENT PLACEMENT Left 03/23/2022   Procedure: CYSTOSCOPY WITH RETROGRADE PYELOGRAM, LEFT URETERAL STENT PLACEMENT;  Surgeon: Matilda Senior, MD;  Location: [facility];  Service: Urology;  Laterality: Left;   CYSTOSCOPY W/ URETERAL STENT PLACEMENT Left 06/09/2022   Procedure: CYSTOSCOPY WITH LEFT STENT EXCHANGE;  Surgeon: Matilda Senior, MD;  Location: [facility];  Service: Urology;  Laterality: Left;   CYSTOSCOPY W/ URETERAL STENT PLACEMENT Left 12/04/2022   Procedure: CYSTOSCOPY WITH STENT REPLACEMENT;  Surgeon: Matilda Senior, MD;  Location: [facility];  Service: Urology;  Laterality: Left;   CYSTOSCOPY W/ URETERAL STENT REMOVAL Left 05/10/2020   Procedure: CYSTOSCOPY WITH STENT REMOVAL;  Surgeon: Matilda Senior, MD;  Location: [facility];  Service: Urology;  Laterality: Left;   CYSTOSCOPY W/ URETERAL STENT REMOVAL Left 05/02/2021   Procedure: CYSTOSCOPY WITH STENT REMOVAL;  Surgeon: Matilda Senior, MD;  Location: [facility];  Service: Urology;   Laterality: Left;   CYSTOSCOPY W/ URETERAL STENT REMOVAL Left 11/16/2022   Procedure: CYSTOSCOPY WITH STENT REMOVAL;  Surgeon: Matilda Senior, MD;  Location: [facility];  Service: Urology;  Laterality: Left;   CYSTOSCOPY WITH RETROGRADE PYELOGRAM, URETEROSCOPY AND STENT PLACEMENT Left 06/27/2019   Procedure: CYSTOSCOPY WITH LEFT RETROGRADE PYELOGRAM, URETEROSCOPY, BIOPSY AND LEFT STENT PLACEMENT;  Surgeon: Watt Rush, MD;  Location: [facility];  Service: Urology;  Laterality: Left;   CYSTOSCOPY WITH RETROGRADE PYELOGRAM, URETEROSCOPY AND STENT PLACEMENT Left 08/18/2019   Procedure: CYSTOSCOPY, URETEROSCOPY AND STENT EXCHANGE;  Surgeon: Matilda Senior, MD;  Location: [facility];  Service: Urology;  Laterality: Left;  90 MINS   CYSTOSCOPY WITH RETROGRADE PYELOGRAM, URETEROSCOPY AND STENT PLACEMENT Left 11/17/2019   Procedure: CYSTOSCOPY WITH RETROGRADE PYELOGRAM, URETEROSCOPY AND STENT PLACEMENT;  Surgeon: Matilda Senior, MD;  Location: [facility];  Service: Urology;  Laterality: Left;  90 MINS   CYSTOSCOPY WITH RETROGRADE PYELOGRAM, URETEROSCOPY AND STENT PLACEMENT Left 05/10/2020   Procedure: CYSTOSCOPY WITH RETROGRADE PYELOGRAM, URETEROSCOPY AND STENT PLACEMENT WITH URETHRAL DIALATION AND BRUSH BIOPSY;  Surgeon: Matilda Senior, MD;  Location: [facility];  Service: Urology;  Laterality: Left;  1 HR   CYSTOSCOPY WITH RETROGRADE PYELOGRAM, URETEROSCOPY AND STENT PLACEMENT Left 09/16/2020   Procedure: CYSTOSCOPY WITH RETROGRADE PYELOGRAM, URETEROSCOPY ,  LITHOPAXY, LEFT URETERAL BRUSHING, AND STENT REPLACEMENT;  Surgeon: Matilda Senior, MD;  Location: [facility];  Service: Urology;  Laterality: Left;   CYSTOSCOPY WITH RETROGRADE PYELOGRAM, URETEROSCOPY AND STENT PLACEMENT Left 10/24/2021   Procedure: CYSTOSCOPY WITH RETROGRADE PYELOGRAM, URETEROSCOPY, POSSIBLE URETERAL AND RENAL BIOPSIES AND STENT PLACEMENT;  Surgeon: Matilda Senior, MD;  Location: [facility];  Service: Urology;  Laterality: Left;  1 HR   CYSTOSCOPY WITH RETROGRADE PYELOGRAM, URETEROSCOPY AND STENT PLACEMENT Left 03/09/2022   Procedure: CYSTOSCOPY WITH ANTEGRADE PYELOGRAM,  STENT REMOVAL;  Surgeon: Matilda Senior, MD;  Location: [facility];  Service: Urology;  Laterality: Left;   CYSTOSCOPY WITH RETROGRADE PYELOGRAM, URETEROSCOPY  AND STENT PLACEMENT  01/09/2007   @[facility]  by dr matilda;    bx's/ washing  bladder , right ureter   CYSTOSCOPY WITH RETROGRADE PYELOGRAM, URETEROSCOPY AND STENT PLACEMENT Left 11/16/2022   Procedure: CYSTOSCOPY WITH RETROGRADE PYELOGRAM, URETEROSCOPY AND STENT REPLACEMENT;  Surgeon: Matilda Senior, MD;  Location: [facility];  Service: Urology;  Laterality: Left;  90 MINS   CYSTOSCOPY WITH RETROGRADE PYELOGRAM, URETEROSCOPY AND STENT PLACEMENT Left 07/06/2023   Procedure: CYSTOSCOPY WITH LEFT RETROGRADE PYELOGRAM, URETEROSCOPY, LASER ABLATION OF TUMOR AND STENT EXCHANGE;  Surgeon: Alvaro Ricardo KATHEE Mickey., MD;  Location: [facility];  Service: Urology;  Laterality: Left;  60 MINUTES NEEDED FOR CASE   CYSTOSCOPY/URETEROSCOPY/HOLMIUM LASER Left 12/04/2022   Procedure: CYSTOSCOPY/URETEROSCOPY/  HOLMIUM LASER OF RENAL PELVIC LESIONS;  Surgeon: Matilda Senior, MD;  Location: [facility];  Service: Urology;  Laterality: Left;   CYSTOSCOPY/URETEROSCOPY/HOLMIUM LASER/STENT PLACEMENT Left 05/02/2021   Procedure: CYSTOSCOPY/URETEROSCOPY WITH BRUSH BIOPSY/ RETROGRADE PYELOGRAM/ HOLMIUM LASER/STENT REPLACEMENT;  Surgeon: Matilda Senior, MD;  Location: [facility];  Service: Urology;  Laterality: Left;   CYSTOSCOPY/URETEROSCOPY/HOLMIUM LASER/STENT PLACEMENT Left 01/30/2024   Procedure: CYSTOSCOPY/DIAGNOSTIC URETEROSCOPY/ RETROGRADE/STENT EXCHANGE;  Surgeon: Alvaro Ricardo KATHEE Mickey., MD;  Location: [facility];  Service: Urology;  Laterality: Left;    CYSTOSCOPY/URETEROSCOPY/HOLMIUM LASER/STENT PLACEMENT Left 08/14/2024   Procedure: CYSTOSCOPY/DIAGNOSTIC URETEROSCOPY/STENT EXCHANGE;  Surgeon: Alvaro Ricardo KATHEE Mickey., MD;  Location: [facility];  Service: Urology;  Laterality: Left;   HOLMIUM LASER APPLICATION Left 09/16/2020   Procedure: HOLMIUM LASER APPLICATION;  Surgeon: Matilda Senior, MD;  Location: [facility];  Service: Urology;  Laterality: Left;   HOLMIUM LASER APPLICATION Left 10/24/2021   Procedure: HOLMIUM LASER APPLICATION OF TUMORS;  Surgeon: Matilda Senior, MD;  Location: [facility];  Service: Urology;  Laterality: Left;   HOLMIUM LASER APPLICATION Left 11/16/2022   Procedure: HOLMIUM LASER OF TUMORS;  Surgeon: Matilda Senior, MD;  Location: [facility];  Service: Urology;  Laterality: Left;   IR NEPHROSTOMY PLACEMENT LEFT  07/06/2021   IR NEPHROSTOMY PLACEMENT LEFT  02/06/2022   NEPHROSTOMY TUBE REMOVAL Left 03/23/2022   Procedure: NEPHROSTOMY TUBE REMOVAL;  Surgeon: Matilda Senior, MD;  Location: [facility];  Service: Urology;  Laterality: Left;   NEPHROURETERECTOMY Right 02/06/2007   @[facility]  by dr matilda;   Laparoscopic   RIGHT/LEFT HEART CATH AND CORONARY ANGIOGRAPHY N/A 11/06/2019   Procedure: RIGHT/LEFT HEART CATH AND CORONARY ANGIOGRAPHY;  Surgeon: Claudene Victory ORN, MD;  Location: [facility];  Service: Cardiovascular;  Laterality: N/A;   THULIUM LASER TURP (TRANSURETHRAL RESECTION OF PROSTATE) Left 08/18/2019   Procedure: THULIUM LASER ABLATION OF URETERAL TUMOR;  Surgeon: Matilda Senior, MD;  Location: [facility];  Service: Urology;  Laterality: Left;   THULIUM LASER TURP (TRANSURETHRAL RESECTION OF PROSTATE) Left 11/17/2019   Procedure: THULIUM LASER of URETERAL CANCER;  Surgeon: Matilda Senior, MD;  Location: [facility];  Service: Urology;  Laterality: Left;   TRANSURETHRAL RESECTION OF BLADDER TUMOR  12/12/2006   @[facility]  by dr  andra   VAGINAL HYSTERECTOMY  1980    Allergies:  Allergies  Allergen Reactions   Erythromycin Diarrhea and Nausea And Vomiting   Lotensin [Benazepril Hcl] Cough   Macrodantin [Nitrofurantoin] Diarrhea    And night sweats    Medications:   Prior to Admission medications   Medication Sig Start Date End Date Taking? Authorizing Provider  acetaminophen  (TYLENOL ) 500 MG tablet Take 500-1,000 mg by mouth every 6 (six) hours  as needed for mild pain.    [provider]  aspirin  EC 81 MG tablet Take 1 tablet (81 mg total) by mouth daily. Swallow whole. Patient not taking: Reported on 08/11/2024 01/09/23   Parthenia Olivia HERO, PA-C  Calcium  Carbonate Antacid (TUMS PO) Take 1 tablet by mouth daily as needed (heartburn).    [provider]  carvedilol  (COREG ) 6.25 MG tablet TAKE 1 TABLET(6.25 MG) BY MOUTH TWICE DAILY WITH A MEAL 02/04/24   Chandrasekhar, Mahesh A, MD  cholecalciferol  (VITAMIN D3) 25 MCG (1000 UNIT) tablet Take 1,000 Units by mouth every morning. Patient not taking: Reported on 08/11/2024    [provider]  ciprofloxacin  (CIPRO ) 500 MG tablet Take 1 tablet (500 mg total) by mouth daily. X 3 days to prevent post-op infection. 08/14/24   Manny, Theodore B Jr., MD  Docusate Calcium  (STOOL SOFTENER PO) Take 1 capsule by mouth daily as needed.    [provider]  Famotidine  (HEARTBURN RELIEF PO) Take 1 tablet by mouth daily as needed (heartburn).    [provider]  Ferrous Sulfate  (IRON  PO) Take 1 tablet by mouth in the morning.    [provider]  ferrous sulfate  325 (65 FE) MG tablet Take 325 mg by mouth daily with breakfast.    [provider]  icosapent  Ethyl (VASCEPA ) 1 g capsule Take 1 capsule (1 g total) by mouth 2 (two) times daily. 04/09/24   Santo Stanly LABOR, MD  icosapent  Ethyl (VASCEPA ) 1 g capsule Take 1 g by mouth 2 (two) times daily.    [provider]  iron  polysaccharides (NIFEREX) 150 MG capsule  Take 1 capsule (150 mg total) by mouth daily. Patient not taking: Reported on 08/11/2024 08/06/24   Sebastian Toribio GAILS, MD  isosorbide  mononitrate (IMDUR ) 30 MG 24 hr tablet TAKE 1/2 TABLET(15 MG) BY MOUTH DAILY Patient taking differently: Take 15 mg by mouth daily. 03/06/24   Chandrasekhar, Mahesh A, MD  rosuvastatin  (CRESTOR ) 40 MG tablet TAKE 1 TABLET(40 MG) BY MOUTH DAILY Patient taking differently: Take 40 mg by mouth daily. 04/29/24   Santo Stanly LABOR, MD  sertraline  (ZOLOFT ) 50 MG tablet Take 50 mg by mouth every morning.    [provider]  traMADol  (ULTRAM ) 50 MG tablet Take 1-2 tablets (50-100 mg total) by mouth every 6 (six) hours as needed. For post-op pain. 08/14/24 08/14/25  Alvaro Ricardo KATHEE Mickey., MD  traZODone  (DESYREL ) 150 MG tablet Take 150 mg by mouth at bedtime. 08/24/20   [provider]    Inpatient medications:   Discontinued Meds:  There are no discontinued medications.  Social History:  reports that she quit smoking about 20 years ago. Her smoking use included cigarettes. She started smoking about 58 years ago. She has a 19 pack-year smoking history. She has never used smokeless tobacco. She reports that she does not currently use alcohol. She reports that she does not use drugs.  Family History:   Family History  Problem Relation Age of Onset   Hypertension Mother    Diabetes Mellitus II Sister    Hypertension Sister    Hypertension Brother    BRCA 1/2 Neg Hx    Breast cancer Neg Hx     Pertinent items are noted in HPI. Weight change:  No intake or output data in the 24 hours ending 09/17/24 1959 BP (!) 145/78 (BP Location: Right Arm)   Pulse 68   Temp 97.6 F (36.4 C)   Resp 16  Ht 5' 3 (1.6 m)   Wt 77.6 kg   SpO2 97%   BMI 30.29 kg/m  Vitals:   09/17/24 1357 09/17/24 1414 09/17/24 1713  BP: (!) 148/73  (!) 145/78  Pulse: 69  68  Resp: (!) 22  16  Temp: (!) 97.4 F (36.3 C)  97.6 F (36.4 C)  TempSrc: Oral    SpO2: 91%   97%  Weight:  77.6 kg   Height:  5' 3 (1.6 m)      General appearance: fatigued, no distress, and pale Head: Normocephalic, without obvious abnormality, atraumatic Resp: clear to auscultation bilaterally Cardio: regular rate and rhythm, S1, S2 normal, no murmur, click, rub or gallop GI: +BS, soft, + tenderness to LLQ with palpation, no guarding or rebound Extremities: extremities normal, atraumatic, no cyanosis or edema  Labs: Basic Metabolic Panel: Recent Labs  Lab 09/17/24 1422  NA 135  K 4.6  CL 106  CO2 17*  GLUCOSE 125*  BUN 105*  CREATININE 10.06*  ALBUMIN  3.0*  CALCIUM  8.5*   Liver Function Tests: Recent Labs  Lab 09/17/24 1422  AST 14*  ALT 11  ALKPHOS 65  BILITOT 0.5  PROT 8.2*  ALBUMIN  3.0*   Recent Labs  Lab 09/17/24 1422  LIPASE 34   No results for input(s): AMMONIA in the last 168 hours. CBC: Recent Labs  Lab 09/17/24 1422  WBC 15.3*  NEUTROABS 12.3*  HGB 8.3*  HCT 25.9*  MCV 91.5  PLT 266   PT/INR: @LABRCNTIP (inr:5) Cardiac Enzymes: )No results for input(s): CKTOTAL, CKMB, CKMBINDEX, TROPONINI in the last 168 hours. CBG: No results for input(s): GLUCAP in the last 168 hours.  Iron  Studies: No results for input(s): IRON , TIBC, TRANSFERRIN, FERRITIN in the last 168 hours.  Xrays/Other Studies: CT ABDOMEN PELVIS WO CONTRAST Result Date: 09/17/2024 EXAM: CT ABDOMEN AND PELVIS WITHOUT CONTRAST 09/17/2024 04:13:13 PM TECHNIQUE: CT of the abdomen and pelvis was performed without the administration of intravenous contrast. Multiplanar reformatted images are provided for review. Automated exposure control, iterative reconstruction, and/or weight-based adjustment of the mA/kV was utilized to reduce the radiation dose to as low as reasonably achievable. COMPARISON: None available. CLINICAL HISTORY: Acute, nonlocalized abdominal pain, including lower abdominal pain for 2 weeks. FINDINGS: LOWER CHEST: Mild scarring or  atelectasis in the lingula and anteriorly in the left lower lobe. Coronary and descending thoracic aortic atherosclerosis. LIVER: The liver is unremarkable. GALLBLADDER AND BILE DUCTS: Gallbladder is unremarkable. No biliary ductal dilatation. SPLEEN: No acute abnormality. PANCREAS: No acute abnormality. ADRENAL GLANDS: No acute abnormality. KIDNEYS, URETERS AND BLADDER: Right nephrectomy. Left ureteral stent with proximal loop formed in the left kidney upper pole collecting system and distal portion in the urinary bladder. Left perirenal stranding minimally increased from 07/29/2024. Stranding around the left renal collecting system and left ureter. No overt hydronephrosis. No stones in the kidneys or ureters. Urinary bladder contains the distal portion of the left ureteral stent. GI AND BOWEL: Stomach demonstrates no acute abnormality. Sigmoid colon diverticulosis. There is no bowel obstruction. PERITONEUM AND RETROPERITONEUM: No ascites. No free air. VASCULATURE: Substantial atheromatous vascular calcifications of the abdominal aorta and its branches including atheromatous plaque at the origins of the celiac trunk and superior mesenteric artery. Aorta is normal in caliber. LYMPH NODES: Retroperitoneal adenopathy especially eccentric to the left in the periaortic region including a 1.2 cm left periaortic lymph node on image 43 series 2, formerly 1.1 cm. A right gastric node measures 0.8 cm in short axis on image  24 series 2, formerly 0.7 cm. REPRODUCTIVE ORGANS: Uterus absent. BONES AND SOFT TISSUES: Lower thoracic and lumbar spondylosis and degenerative disc disease. No acute osseous abnormality. No focal soft tissue abnormality. IMPRESSION: 1. Left ureteral stent in place with periureteral and pericollecting system stranding and minimally increased left perirenal stranding, without overt hydronephrosis. 2. Retroperitoneal adenopathy with slight interval increase, including a 1.2 cm left periaortic lymph node. 3.  Sigmoid colon diverticulosis without evidence of diverticulitis. 4. Lower thoracic and lumbar spondylosis and degenerative disc disease. 5. Several additional chronic and incidental findings including coronary and descending thoracic aortic atherosclerosis, substantial atheromatous calcifications of the abdominal aorta and branch vessels with plaque at the celiac and superior mesenteric artery origins, right nephrectomy, mild scarring or atelectasis in the lingula and left lower lobe, and status post hysterectomy. Electronically signed by: Ryan Salvage MD 09/17/2024 05:04 PM EST RP Workstation: [id number]     Assessment/Plan:   AKI/CKD stage IV - likely due to volume depletion given 3 weeks of N/V and poor po intake vs progressive CKD to stage V in pt with solitary functioning kidney.  Recommend starting IVF's with lactated ringers  and follow uop and daily Scr.  She will likely need to start dialysis during this admission but no emergent need tonight.  Will likely need [facility] tomorrow (or temp HD cath given leukocytes and possible infection).   Will follow UOP and Scr. Avoid nephrotoxic medications including NSAIDs and iodinated intravenous contrast exposure unless the latter is absolutely indicated.   Preferred narcotic agents for pain control are hydromorphone , fentanyl , and methadone. Morphine  should not be used.  Avoid Baclofen and avoid oral sodium phosphate  and magnesium  citrate based laxatives / bowel preps.  Continue strict Input and Output monitoring. Will monitor the patient closely with you and intervene or adjust therapy as indicated by changes in clinical status/labs  Refractory N/V - IVF's as above and anti-emetics per primary svc.  Possible recurrent UTI given LLQ pain and UA with leukocytes and bacteria.   Leukocytosis - possible recurrent UTI given stranding of periureteral, peri-collecting system, and perirenal on CT scan.  She had enterococcus faecalis UTI during her admission in  October.  Need to send urine for culture and sensitivity.  Abx per primary svc. Left ureteral carcinoma - s/p stent exchange on 08/14/24 without evidence of hydronephrosis. AGMA - likely due to #1.  Start lactated ringers  and follow. Normocytic anemia - likely due to CKD stage IV.  Hold off on ESA for now given malignancy.  Followed by Oncology.  Transfuse prn Hgb <7.  Continue to follow for now.  HTN - stable   Fairy DELENA Sellar 09/17/2024, 7:59 PM

## 2024-09-17 NOTE — ED Notes (Signed)
 IV team at bedside after multiple unsuccessful attempts by this RN to establish access

## 2024-09-17 NOTE — H&P (Signed)
 History and Physical    [name] FMW:[id number] DOB: 05-May-1946 DOA: 09/17/2024  PCP: Chrystal Lamarr RAMAN, MD   Chief Complaint: siegfried  HPI: [name] is a 78 y.o. female with medical history significant of metastatic urothelial carcinoma status post nephrectomy who presented with abdominal pain, nausea, vomiting.  Patient was found to have elevated creatinine when seen by Her urologist that she presented to the ER for further assessment.  She has a history of CKD stage IV and is followed by her outpatient nephrologist.  She has had 3 weeks of nausea vomiting poor p.o. intake.  She presented to the ER where she was found to be afebrile and hemodynamically stable.  Labs were obtained which showed WBC 15.3, hemoglobin 8.3, BUN 105, creatinine 10, bicarb 17.  Urinalysis with blood.  CT scan showed left ureteral stent in appropriate position without evidence of hydronephrosis.  Nephrology was consulted and recommended continuing IV fluids and checking for UTI.   Review of Systems: Review of Systems  Constitutional:  Negative for chills, fever and weight loss.  HENT: Negative.    Eyes: Negative.   Respiratory: Negative.    Cardiovascular: Negative.   Gastrointestinal: Negative.   Genitourinary: Negative.   Musculoskeletal: Negative.   Skin: Negative.   Neurological: Negative.   Endo/Heme/Allergies: Negative.   Psychiatric/Behavioral: Negative.    All other systems reviewed and are negative.    As per HPI otherwise 10 point review of systems negative.   Allergies  Allergen Reactions   Erythromycin Diarrhea and Nausea And Vomiting   Lotensin [Benazepril Hcl] Cough   Macrodantin [Nitrofurantoin] Diarrhea    And night sweats    Past Medical History:  Diagnosis Date   Anemia associated with chronic renal failure    Arthritis    Blood dyscrasia 2008   hx of PE   CAD in native artery 10/2019   cardiologist-   dr christella. santo;   a. 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   urologist--- dr dahlstedt/  oncologist--- dr amadeo;   dx 09/ 2020 high grade urothelial  s/p laser ablation, ;  recurrent s/p BCG instillation,  completed chemo instilation 11/ 2022  and repeat chemo completed 05/ 2023   Chronic combined systolic and diastolic CHF (congestive heart failure) (HCC) 06/2019   followed by cardiology;   dx 09/ 2022 in setting of sepsis, pulm edema;   05/ 2022  ef 40-45% per echo,  recovered per cath 01/ 2021 ef 50%;  laste echo 08/ 2023  ef 60-65%   CKD (chronic kidney disease), stage III (HCC)    Depression    Dysrhythmia    bradycardia   GAD (generalized anxiety disorder)    GERD (gastroesophageal reflux disease)    History of bladder cancer 12/2006   followed by dr matilda   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   dx 04/ 2008  w/ poor function hydronephrotic  right kidney;   02-06-2007  s/p right nephroureterectomy   History of kidney stones    History of pulmonary embolism 04/2022   in setting severe sepsis;  small RLL, treated w/ 3 months eliquis    Hyperlipidemia, mixed    Hypertension    Solitary kidney, acquired 02/06/2007   s/p  right nephroureterectomy for cancer    Past Surgical History:  Procedure Laterality Date   CATARACT EXTRACTION W/ INTRAOCULAR LENS IMPLANT Bilateral 2011  CYSTOSCOPY W/ RETROGRADES Left 01/30/2024   Procedure: CYSTOSCOPY, WITH RETROGRADE PYELOGRAM;  Surgeon: Alvaro Ricardo KATHEE Mickey., MD;  Location: [facility];  Service: Urology;  Laterality: Left;   CYSTOSCOPY W/ RETROGRADES Left 08/14/2024   Procedure: CYSTOSCOPY, WITH RETROGRADE PYELOGRAM;  Surgeon: Alvaro Ricardo KATHEE Mickey., MD;  Location: [facility];  Service: Urology;  Laterality: Left;   CYSTOSCOPY W/ URETERAL STENT PLACEMENT Left 11/17/2019   Procedure: CYSTOSCOPY WITH STENT REPLACEMENT;  Surgeon: Matilda Senior, MD;  Location: [facility];   Service: Urology;  Laterality: Left;   CYSTOSCOPY W/ URETERAL STENT PLACEMENT Left 03/23/2022   Procedure: CYSTOSCOPY WITH RETROGRADE PYELOGRAM, LEFT URETERAL STENT PLACEMENT;  Surgeon: Matilda Senior, MD;  Location: [facility];  Service: Urology;  Laterality: Left;   CYSTOSCOPY W/ URETERAL STENT PLACEMENT Left 06/09/2022   Procedure: CYSTOSCOPY WITH LEFT STENT EXCHANGE;  Surgeon: Matilda Senior, MD;  Location: [facility];  Service: Urology;  Laterality: Left;   CYSTOSCOPY W/ URETERAL STENT PLACEMENT Left 12/04/2022   Procedure: CYSTOSCOPY WITH STENT REPLACEMENT;  Surgeon: Matilda Senior, MD;  Location: [facility];  Service: Urology;  Laterality: Left;   CYSTOSCOPY W/ URETERAL STENT REMOVAL Left 05/10/2020   Procedure: CYSTOSCOPY WITH STENT REMOVAL;  Surgeon: Matilda Senior, MD;  Location: [facility];  Service: Urology;  Laterality: Left;   CYSTOSCOPY W/ URETERAL STENT REMOVAL Left 05/02/2021   Procedure: CYSTOSCOPY WITH STENT REMOVAL;  Surgeon: Matilda Senior, MD;  Location: [facility];  Service: Urology;  Laterality: Left;   CYSTOSCOPY W/ URETERAL STENT REMOVAL Left 11/16/2022   Procedure: CYSTOSCOPY WITH STENT REMOVAL;  Surgeon: Matilda Senior, MD;  Location[facility];  Service: Urology;  Laterality: Left;   CYSTOSCOPY WITH RETROGRADE PYELOGRAM, URETEROSCOPY AND STENT PLACEMENT Left 06/27/2019   Procedure: CYSTOSCOPY WITH LEFT RETROGRADE PYELOGRAM, URETEROSCOPY, BIOPSY AND LEFT STENT PLACEMENT;  Surgeon: Watt Rush, MD;  Location: [facility];  Service: Urology;  Laterality: Left;   CYSTOSCOPY WITH RETROGRADE PYELOGRAM, URETEROSCOPY AND STENT PLACEMENT Left 08/18/2019   Procedure: CYSTOSCOPY, URETEROSCOPY AND STENT EXCHANGE;  Surgeon: Matilda Senior, MD;  Location: [facility];  Service: Urology;  Laterality: Left;  90 MINS   CYSTOSCOPY WITH RETROGRADE PYELOGRAM, URETEROSCOPY AND STENT PLACEMENT Left  11/17/2019   Procedure: CYSTOSCOPY WITH RETROGRADE PYELOGRAM, URETEROSCOPY AND STENT PLACEMENT;  Surgeon: Matilda Senior, MD;  Location: [facility];  Service: Urology;  Laterality: Left;  90 MINS   CYSTOSCOPY WITH RETROGRADE PYELOGRAM, URETEROSCOPY AND STENT PLACEMENT Left 05/10/2020   Procedure: CYSTOSCOPY WITH RETROGRADE PYELOGRAM, URETEROSCOPY AND STENT PLACEMENT WITH URETHRAL DIALATION AND BRUSH BIOPSY;  Surgeon: Matilda Senior, MD;  Location: [facility];  Service: Urology;  Laterality: Left;  1 HR   CYSTOSCOPY WITH RETROGRADE PYELOGRAM, URETEROSCOPY AND STENT PLACEMENT Left 09/16/2020   Procedure: CYSTOSCOPY WITH RETROGRADE PYELOGRAM, URETEROSCOPY ,  LITHOPAXY, LEFT URETERAL BRUSHING, AND STENT REPLACEMENT;  Surgeon: Matilda Senior, MD;  Location: [facility];  Service: Urology;  Laterality: Left;   CYSTOSCOPY WITH RETROGRADE PYELOGRAM, URETEROSCOPY AND STENT PLACEMENT Left 10/24/2021   Procedure: CYSTOSCOPY WITH RETROGRADE PYELOGRAM, URETEROSCOPY, POSSIBLE URETERAL AND RENAL BIOPSIES AND STENT PLACEMENT;  Surgeon: Matilda Senior, MD;  Location: [facility];  Service: Urology;  Laterality: Left;  1 HR   CYSTOSCOPY WITH RETROGRADE PYELOGRAM, URETEROSCOPY AND STENT PLACEMENT Left 03/09/2022   Procedure: CYSTOSCOPY WITH ANTEGRADE PYELOGRAM,  STENT REMOVAL;  Surgeon: Matilda Senior, MD;  Location: [facility];  Service: Urology;  Laterality: Left;  CYSTOSCOPY WITH RETROGRADE PYELOGRAM, URETEROSCOPY AND STENT PLACEMENT  01/09/2007   @[facility]  by dr matilda;    bx's/ washing  bladder , right ureter   CYSTOSCOPY WITH RETROGRADE PYELOGRAM, URETEROSCOPY AND STENT PLACEMENT Left 11/16/2022   Procedure: CYSTOSCOPY WITH RETROGRADE PYELOGRAM, URETEROSCOPY AND STENT REPLACEMENT;  Surgeon: Matilda Senior, MD;  Location: [facility];  Service: Urology;  Laterality: Left;  90 MINS   CYSTOSCOPY WITH  RETROGRADE PYELOGRAM, URETEROSCOPY AND STENT PLACEMENT Left 07/06/2023   Procedure: CYSTOSCOPY WITH LEFT RETROGRADE PYELOGRAM, URETEROSCOPY, LASER ABLATION OF TUMOR AND STENT EXCHANGE;  Surgeon: Alvaro Ricardo KATHEE Mickey., MD;  Location: [facility];  Service: Urology;  Laterality: Left;  60 MINUTES NEEDED FOR CASE   CYSTOSCOPY/URETEROSCOPY/HOLMIUM LASER Left 12/04/2022   Procedure: CYSTOSCOPY/URETEROSCOPY/  HOLMIUM LASER OF RENAL PELVIC LESIONS;  Surgeon: Matilda Senior, MD;  Location: [facility];  Service: Urology;  Laterality: Left;   CYSTOSCOPY/URETEROSCOPY/HOLMIUM LASER/STENT PLACEMENT Left 05/02/2021   Procedure: CYSTOSCOPY/URETEROSCOPY WITH BRUSH BIOPSY/ RETROGRADE PYELOGRAM/ HOLMIUM LASER/STENT REPLACEMENT;  Surgeon: Matilda Senior, MD;  Location: [facility];  Service: Urology;  Laterality: Left;   CYSTOSCOPY/URETEROSCOPY/HOLMIUM LASER/STENT PLACEMENT Left 01/30/2024   Procedure: CYSTOSCOPY/DIAGNOSTIC URETEROSCOPY/ RETROGRADE/STENT EXCHANGE;  Surgeon: Alvaro Ricardo KATHEE Mickey., MD;  Location: [facility];  Service: Urology;  Laterality: Left;   CYSTOSCOPY/URETEROSCOPY/HOLMIUM LASER/STENT PLACEMENT Left 08/14/2024   Procedure: CYSTOSCOPY/DIAGNOSTIC URETEROSCOPY/STENT EXCHANGE;  Surgeon: Alvaro Ricardo KATHEE Mickey., MD;  Location: [facility];  Service: Urology;  Laterality: Left;   HOLMIUM LASER APPLICATION Left 09/16/2020   Procedure: HOLMIUM LASER APPLICATION;  Surgeon: Matilda Senior, MD;  Location: [facility];  Service: Urology;  Laterality: Left;   HOLMIUM LASER APPLICATION Left 10/24/2021   Procedure: HOLMIUM LASER APPLICATION OF TUMORS;  Surgeon: Matilda Senior, MD;  Location: [facility];  Service: Urology;  Laterality: Left;   HOLMIUM LASER APPLICATION Left 11/16/2022   Procedure: HOLMIUM LASER OF TUMORS;  Surgeon: Matilda Senior, MD;  Location: [facility];  Service: Urology;  Laterality: Left;   IR NEPHROSTOMY PLACEMENT  LEFT  07/06/2021   IR NEPHROSTOMY PLACEMENT LEFT  02/06/2022   NEPHROSTOMY TUBE REMOVAL Left 03/23/2022   Procedure: NEPHROSTOMY TUBE REMOVAL;  Surgeon: Matilda Senior, MD;  Location: [facility];  Service: Urology;  Laterality: Left;   NEPHROURETERECTOMY Right 02/06/2007   @[facility]  by dr matilda;   Laparoscopic   RIGHT/LEFT HEART CATH AND CORONARY ANGIOGRAPHY N/A 11/06/2019   Procedure: RIGHT/LEFT HEART CATH AND CORONARY ANGIOGRAPHY;  Surgeon: Claudene Victory ORN, MD;  Location: [facility];  Service: Cardiovascular;  Laterality: N/A;   THULIUM LASER TURP (TRANSURETHRAL RESECTION OF PROSTATE) Left 08/18/2019   Procedure: THULIUM LASER ABLATION OF URETERAL TUMOR;  Surgeon: Matilda Senior, MD;  Location: [facility];  Service: Urology;  Laterality: Left;   THULIUM LASER TURP (TRANSURETHRAL RESECTION OF PROSTATE) Left 11/17/2019   Procedure: THULIUM LASER of URETERAL CANCER;  Surgeon: Matilda Senior, MD;  Location: [facility];  Service: Urology;  Laterality: Left;   TRANSURETHRAL RESECTION OF BLADDER TUMOR  12/12/2006   @[facility]  by dr andra   VAGINAL HYSTERECTOMY  1980     reports that she quit smoking about 20 years ago. Her smoking use included cigarettes. She started smoking about 58 years ago. She has a 19 pack-year smoking history. She has never used smokeless tobacco. She reports that she does not currently use alcohol. She reports that she does not use drugs.  Family History  Problem Relation Age of Onset  Hypertension Mother    Diabetes Mellitus II Sister    Hypertension Sister    Hypertension Brother    BRCA 1/2 Neg Hx    Breast cancer Neg Hx     Prior to Admission medications   Medication Sig Start Date End Date Taking? Authorizing Provider  acetaminophen  (TYLENOL ) 500 MG tablet Take 500-1,000 mg by mouth every 6 (six) hours as needed for mild pain.    [provider]  aspirin  EC 81 MG tablet Take 1 tablet (81 mg total) by mouth  daily. Swallow whole. Patient not taking: Reported on 08/11/2024 01/09/23   Parthenia Olivia HERO, PA-C  Calcium  Carbonate Antacid (TUMS PO) Take 1 tablet by mouth daily as needed (heartburn).    [provider]  carvedilol  (COREG ) 6.25 MG tablet TAKE 1 TABLET(6.25 MG) BY MOUTH TWICE DAILY WITH A MEAL 02/04/24   Chandrasekhar, Mahesh A, MD  cholecalciferol  (VITAMIN D3) 25 MCG (1000 UNIT) tablet Take 1,000 Units by mouth every morning. Patient not taking: Reported on 08/11/2024    [provider]  ciprofloxacin  (CIPRO ) 500 MG tablet Take 1 tablet (500 mg total) by mouth daily. X 3 days to prevent post-op infection. 08/14/24   Manny, Theodore B Jr., MD  Docusate Calcium  (STOOL SOFTENER PO) Take 1 capsule by mouth daily as needed.    [provider]  Famotidine  (HEARTBURN RELIEF PO) Take 1 tablet by mouth daily as needed (heartburn).    [provider]  Ferrous Sulfate  (IRON  PO) Take 1 tablet by mouth in the morning.    [provider]  ferrous sulfate  325 (65 FE) MG tablet Take 325 mg by mouth daily with breakfast.    [provider]  icosapent  Ethyl (VASCEPA ) 1 g capsule Take 1 capsule (1 g total) by mouth 2 (two) times daily. 04/09/24   Santo Stanly LABOR, MD  icosapent  Ethyl (VASCEPA ) 1 g capsule Take 1 g by mouth 2 (two) times daily.    [provider]  iron  polysaccharides (NIFEREX) 150 MG capsule Take 1 capsule (150 mg total) by mouth daily. Patient not taking: Reported on 08/11/2024 08/06/24   Sebastian Toribio GAILS, MD  isosorbide  mononitrate (IMDUR ) 30 MG 24 hr tablet TAKE 1/2 TABLET(15 MG) BY MOUTH DAILY Patient taking differently: Take 15 mg by mouth daily. 03/06/24   Chandrasekhar, Stanly LABOR, MD  rosuvastatin  (CRESTOR ) 40 MG tablet TAKE 1 TABLET(40 MG) BY MOUTH DAILY Patient taking differently: Take 40 mg by mouth daily. 04/29/24   Santo Stanly LABOR, MD  sertraline  (ZOLOFT ) 50 MG tablet Take 50 mg by mouth every morning.    [provider]  traMADol  (ULTRAM ) 50 MG tablet Take 1-2 tablets (50-100 mg total) by mouth every 6 (six) hours as needed. For post-op pain. 08/14/24 08/14/25  Alvaro Ricardo KATHEE Mickey., MD  traZODone  (DESYREL ) 150 MG tablet Take 150 mg by mouth at bedtime. 08/24/20   [provider]    Physical Exam: Vitals:   09/17/24 1713 09/17/24 2151 09/17/24 2151 09/17/24 2153  BP: (!) 145/78 (!) 208/88  (!) 213/90  Pulse: 68 68 67 70  Resp: 16  18   Temp: 97.6 F (36.4 C)  97.6 F (36.4 C)   TempSrc:   Oral   SpO2: 97% 100% 100% 100%  Weight:      Height:       Physical Exam Constitutional:      Appearance: She is normal weight.  HENT:     Head: Normocephalic.  Nose: Nose normal.     Mouth/Throat:     Mouth: Mucous membranes are moist.     Pharynx: Oropharynx is clear.  Eyes:     Conjunctiva/sclera: Conjunctivae normal.     Pupils: Pupils are equal, round, and reactive to light.  Cardiovascular:     Rate and Rhythm: Normal rate and regular rhythm.     Pulses: Normal pulses.  Pulmonary:     Effort: Pulmonary effort is normal.  Abdominal:     General: Abdomen is flat.  Musculoskeletal:        General: Normal range of motion.     Cervical back: Normal range of motion.  Skin:    General: Skin is warm.     Capillary Refill: Capillary refill takes less than 2 seconds.  Neurological:     General: No focal deficit present.     Mental Status: She is alert.       Labs on Admission: I have personally reviewed the patients's labs and imaging studies.  Assessment/Plan Principal Problem:   Acute renal failure   # AKI on CKD stage IV #Hx of metastatic urothelial carcinoma s/p right nephrectomy - Patient appears dehydrated - Elevated creatinine with uremia and acidosis - Nephrology consulted  Plan: Continue IV fluids Check Bmp in morning Appc nephro recs  #HTN- prn hydral  #HLD- continue statin  #UTI- continue ctx   Admission status: Inpatient  Telemetry  Certification: The appropriate patient status for this patient is INPATIENT. Inpatient status is judged to be reasonable and necessary in order to provide the required intensity of service to ensure the patient's safety. The patient's presenting symptoms, physical exam findings, and initial radiographic and laboratory data in the context of their chronic comorbidities is felt to place them at high risk for further clinical deterioration. Furthermore, it is not anticipated that the patient will be medically stable for discharge from the hospital within 2 midnights of admission.   * I certify that at the point of admission it is my clinical judgment that the patient will require inpatient hospital care spanning beyond 2 midnights from the point of admission due to high intensity of service, high risk for further deterioration and high frequency of surveillance required.DEWAINE Lamar Dess MD [facility] If 7PM-7AM, please contact night-coverage [email or web address]  09/17/2024, 9:59 PM

## 2024-09-17 NOTE — ED Provider Notes (Signed)
 [place] [facility] Provider Note   CSN: [id number] Arrival date & time: 09/17/24  1355     Patient presents with: Abnormal Lab, Emesis, and Nausea   [name] is a 78 y.o. female with history of metastatic urothelial carcinoma status post right nephrectomy presents with complaints of 2 weeks of lower abdominal pain with associated nausea and vomiting.  Seen by urology and referred here for concern for AKI.  Patient reports that her abdominal pain is resolved.    Abnormal Lab Emesis     Past Medical History:  Diagnosis Date   Anemia associated with chronic renal failure    Arthritis    Blood dyscrasia 2008   hx of PE   CAD in native artery 10/2019   cardiologist-   dr christella. santo;   a. 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   urologist--- dr dahlstedt/  oncologist--- dr amadeo;   dx 09/ 2020 high grade urothelial  s/p laser ablation, ;  recurrent s/p BCG instillation,  completed chemo instilation 11/ 2022  and repeat chemo completed 05/ 2023   Chronic combined systolic and diastolic CHF (congestive heart failure) (HCC) 06/2019   followed by cardiology;   dx 09/ 2022 in setting of sepsis, pulm edema;   05/ 2022  ef 40-45% per echo,  recovered per cath 01/ 2021 ef 50%;  laste echo 08/ 2023  ef 60-65%   CKD (chronic kidney disease), stage III (HCC)    Depression    Dysrhythmia    bradycardia   GAD (generalized anxiety disorder)    GERD (gastroesophageal reflux disease)    History of bladder cancer 12/2006   followed by dr matilda   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   dx 04/ 2008  w/ poor function hydronephrotic  right kidney;   02-06-2007  s/p right nephroureterectomy   History of kidney stones    History of pulmonary embolism 04/2022   in setting severe sepsis;  small RLL, treated w/ 3 months eliquis     Hyperlipidemia, mixed    Hypertension    Solitary kidney, acquired 02/06/2007   s/p  right nephroureterectomy for cancer   Past Surgical History:  Procedure Laterality Date   CATARACT EXTRACTION W/ INTRAOCULAR LENS IMPLANT Bilateral 2011   CYSTOSCOPY W/ RETROGRADES Left 01/30/2024   Procedure: CYSTOSCOPY, WITH RETROGRADE PYELOGRAM;  Surgeon: Alvaro Ricardo KATHEE Mickey., MD;  Location: [facility];  Service: Urology;  Laterality: Left;   CYSTOSCOPY W/ RETROGRADES Left 08/14/2024   Procedure: CYSTOSCOPY, WITH RETROGRADE PYELOGRAM;  Surgeon: Alvaro Ricardo KATHEE Mickey., MD;  Location: [facility];  Service: Urology;  Laterality: Left;   CYSTOSCOPY W/ URETERAL STENT PLACEMENT Left 11/17/2019   Procedure: CYSTOSCOPY WITH STENT REPLACEMENT;  Surgeon: Matilda Senior, MD;  Location: [facility];  Service: Urology;  Laterality: Left;   CYSTOSCOPY W/ URETERAL STENT PLACEMENT Left 03/23/2022   Procedure: CYSTOSCOPY WITH RETROGRADE PYELOGRAM, LEFT URETERAL STENT PLACEMENT;  Surgeon: Matilda Senior, MD;  Location: [facility];  Service: Urology;  Laterality: Left;   CYSTOSCOPY W/ URETERAL STENT PLACEMENT Left 06/09/2022   Procedure: CYSTOSCOPY WITH LEFT STENT EXCHANGE;  Surgeon: Matilda Senior, MD;  Location: [facility];  Service: Urology;  Laterality: Left;   CYSTOSCOPY W/ URETERAL STENT PLACEMENT Left 12/04/2022   Procedure: CYSTOSCOPY WITH STENT REPLACEMENT;  Surgeon: Matilda Senior, MD;  Location: [facility];  Service: Urology;  Laterality: Left;   CYSTOSCOPY W/ URETERAL STENT REMOVAL Left 05/10/2020   Procedure: CYSTOSCOPY WITH STENT REMOVAL;  Surgeon: Matilda Senior, MD;  Location: [facility];  Service: Urology;  Laterality: Left;   CYSTOSCOPY W/ URETERAL STENT REMOVAL Left 05/02/2021   Procedure: CYSTOSCOPY WITH STENT REMOVAL;  Surgeon: Matilda Senior, MD;  Location: [facility];  Service: Urology;  Laterality: Left;    CYSTOSCOPY W/ URETERAL STENT REMOVAL Left 11/16/2022   Procedure: CYSTOSCOPY WITH STENT REMOVAL;  Surgeon: Matilda Senior, MD;  Location: [facility];  Service: Urology;  Laterality: Left;   CYSTOSCOPY WITH RETROGRADE PYELOGRAM, URETEROSCOPY AND STENT PLACEMENT Left 06/27/2019   Procedure: CYSTOSCOPY WITH LEFT RETROGRADE PYELOGRAM, URETEROSCOPY, BIOPSY AND LEFT STENT PLACEMENT;  Surgeon: Watt Rush, MD;  Location: [facility];  Service: Urology;  Laterality: Left;   CYSTOSCOPY WITH RETROGRADE PYELOGRAM, URETEROSCOPY AND STENT PLACEMENT Left 08/18/2019   Procedure: CYSTOSCOPY, URETEROSCOPY AND STENT EXCHANGE;  Surgeon: Matilda Senior, MD;  Location: [facility];  Service: Urology;  Laterality: Left;  90 MINS   CYSTOSCOPY WITH RETROGRADE PYELOGRAM, URETEROSCOPY AND STENT PLACEMENT Left 11/17/2019   Procedure: CYSTOSCOPY WITH RETROGRADE PYELOGRAM, URETEROSCOPY AND STENT PLACEMENT;  Surgeon: Matilda Senior, MD;  Location: [facility];  Service: Urology;  Laterality: Left;  90 MINS   CYSTOSCOPY WITH RETROGRADE PYELOGRAM, URETEROSCOPY AND STENT PLACEMENT Left 05/10/2020   Procedure: CYSTOSCOPY WITH RETROGRADE PYELOGRAM, URETEROSCOPY AND STENT PLACEMENT WITH URETHRAL DIALATION AND BRUSH BIOPSY;  Surgeon: Matilda Senior, MD;  Location: [facility];  Service: Urology;  Laterality: Left;  1 HR   CYSTOSCOPY WITH RETROGRADE PYELOGRAM, URETEROSCOPY AND STENT PLACEMENT Left 09/16/2020   Procedure: CYSTOSCOPY WITH RETROGRADE PYELOGRAM, URETEROSCOPY ,  LITHOPAXY, LEFT URETERAL BRUSHING, AND STENT REPLACEMENT;  Surgeon: Matilda Senior, MD;  Location: [facility];  Service: Urology;  Laterality: Left;   CYSTOSCOPY WITH RETROGRADE PYELOGRAM, URETEROSCOPY AND STENT PLACEMENT Left 10/24/2021   Procedure: CYSTOSCOPY WITH RETROGRADE PYELOGRAM, URETEROSCOPY, POSSIBLE URETERAL AND RENAL BIOPSIES AND STENT PLACEMENT;  Surgeon: Matilda Senior, MD;  Location:  [facility];  Service: Urology;  Laterality: Left;  1 HR   CYSTOSCOPY WITH RETROGRADE PYELOGRAM, URETEROSCOPY AND STENT PLACEMENT Left 03/09/2022   Procedure: CYSTOSCOPY WITH ANTEGRADE PYELOGRAM,  STENT REMOVAL;  Surgeon: Matilda Senior, MD;  Location: [facility];  Service: Urology;  Laterality: Left;   CYSTOSCOPY WITH RETROGRADE PYELOGRAM, URETEROSCOPY AND STENT PLACEMENT  01/09/2007   @[facility]  by dr matilda;    bx's/ washing  bladder , right ureter   CYSTOSCOPY WITH RETROGRADE PYELOGRAM, URETEROSCOPY AND STENT PLACEMENT Left 11/16/2022   Procedure: CYSTOSCOPY WITH RETROGRADE PYELOGRAM, URETEROSCOPY AND STENT REPLACEMENT;  Surgeon: Matilda Senior, MD;  Location: [facility];  Service: Urology;  Laterality: Left;  90 MINS   CYSTOSCOPY WITH RETROGRADE PYELOGRAM, URETEROSCOPY AND STENT PLACEMENT Left 07/06/2023   Procedure: CYSTOSCOPY WITH LEFT RETROGRADE PYELOGRAM, URETEROSCOPY, LASER ABLATION OF TUMOR AND STENT EXCHANGE;  Surgeon: Alvaro Ricardo KATHEE Mickey., MD;  Location: [facility];  Service: Urology;  Laterality: Left;  60 MINUTES NEEDED FOR CASE   CYSTOSCOPY/URETEROSCOPY/HOLMIUM LASER Left 12/04/2022   Procedure: CYSTOSCOPY/URETEROSCOPY/  HOLMIUM LASER OF RENAL PELVIC LESIONS;  Surgeon: Matilda Senior, MD;  Location: [facility];  Service: Urology;  Laterality: Left;   CYSTOSCOPY/URETEROSCOPY/HOLMIUM LASER/STENT PLACEMENT Left 05/02/2021   Procedure: CYSTOSCOPY/URETEROSCOPY WITH BRUSH BIOPSY/ RETROGRADE PYELOGRAM/ HOLMIUM LASER/STENT REPLACEMENT;  Surgeon: Matilda Senior, MD;  Location: [facility];  Service:  Urology;  Laterality: Left;   CYSTOSCOPY/URETEROSCOPY/HOLMIUM LASER/STENT PLACEMENT Left 01/30/2024   Procedure: CYSTOSCOPY/DIAGNOSTIC URETEROSCOPY/ RETROGRADE/STENT EXCHANGE;  Surgeon: Alvaro Ricardo KATHEE Mickey., MD;  Location: [facility];  Service: Urology;  Laterality: Left;   CYSTOSCOPY/URETEROSCOPY/HOLMIUM LASER/STENT  PLACEMENT Left 08/14/2024   Procedure: CYSTOSCOPY/DIAGNOSTIC URETEROSCOPY/STENT EXCHANGE;  Surgeon: Alvaro Ricardo KATHEE Mickey., MD;  Location: [facility];  Service: Urology;  Laterality: Left;   HOLMIUM LASER APPLICATION Left 09/16/2020   Procedure: HOLMIUM LASER APPLICATION;  Surgeon: Matilda Senior, MD;  Location: [facility];  Service: Urology;  Laterality: Left;   HOLMIUM LASER APPLICATION Left 10/24/2021   Procedure: HOLMIUM LASER APPLICATION OF TUMORS;  Surgeon: Matilda Senior, MD;  Location: [facility];  Service: Urology;  Laterality: Left;   HOLMIUM LASER APPLICATION Left 11/16/2022   Procedure: HOLMIUM LASER OF TUMORS;  Surgeon: Matilda Senior, MD;  Location: [facility];  Service: Urology;  Laterality: Left;   IR NEPHROSTOMY PLACEMENT LEFT  07/06/2021   IR NEPHROSTOMY PLACEMENT LEFT  02/06/2022   NEPHROSTOMY TUBE REMOVAL Left 03/23/2022   Procedure: NEPHROSTOMY TUBE REMOVAL;  Surgeon: Matilda Senior, MD;  Location: [facility];  Service: Urology;  Laterality: Left;   NEPHROURETERECTOMY Right 02/06/2007   @[facility]  by dr matilda;   Laparoscopic   RIGHT/LEFT HEART CATH AND CORONARY ANGIOGRAPHY N/A 11/06/2019   Procedure: RIGHT/LEFT HEART CATH AND CORONARY ANGIOGRAPHY;  Surgeon: Claudene Victory ORN, MD;  Location: [facility] INVASIVE CV LAB;  Service: Cardiovascular;  Laterality: N/A;   THULIUM LASER TURP (TRANSURETHRAL RESECTION OF PROSTATE) Left 08/18/2019   Procedure: THULIUM LASER ABLATION OF URETERAL TUMOR;  Surgeon: Matilda Senior, MD;  Location: [facility];  Service: Urology;  Laterality: Left;   THULIUM LASER TURP (TRANSURETHRAL RESECTION OF PROSTATE) Left 11/17/2019   Procedure: THULIUM LASER of URETERAL CANCER;  Surgeon: Matilda Senior, MD;  Location: [facility];  Service: Urology;  Laterality: Left;   TRANSURETHRAL RESECTION OF BLADDER TUMOR  12/12/2006   @[facility]  by dr andra   VAGINAL HYSTERECTOMY  1980      Prior to Admission medications   Medication Sig Start Date End Date Taking? Authorizing Provider  acetaminophen  (TYLENOL ) 500 MG tablet Take 500-1,000 mg by mouth every 6 (six) hours as needed for mild pain.    [provider]  aspirin  EC 81 MG tablet Take 1 tablet (81 mg total) by mouth daily. Swallow whole. Patient not taking: Reported on 08/11/2024 01/09/23   Parthenia Olivia HERO, PA-C  Calcium  Carbonate Antacid (TUMS PO) Take 1 tablet by mouth daily as needed (heartburn).    [provider]  carvedilol  (COREG ) 6.25 MG tablet TAKE 1 TABLET(6.25 MG) BY MOUTH TWICE DAILY WITH A MEAL 02/04/24   Chandrasekhar, Mahesh A, MD  cholecalciferol  (VITAMIN D3) 25 MCG (1000 UNIT) tablet Take 1,000 Units by mouth every morning. Patient not taking: Reported on 08/11/2024    [provider]  ciprofloxacin  (CIPRO ) 500 MG tablet Take 1 tablet (500 mg total) by mouth daily. X 3 days to prevent post-op infection. 08/14/24   Alvaro Ricardo KATHEE Mickey., MD  Docusate Calcium  (STOOL SOFTENER PO) Take 1 capsule by mouth daily as needed.    [provider]  Famotidine  (HEARTBURN RELIEF PO) Take 1 tablet by mouth daily as needed (heartburn).    [provider]  Ferrous Sulfate  (IRON  PO) Take 1 tablet by mouth in the morning.    [provider]  ferrous sulfate  325 (65 FE) MG tablet Take 325 mg by mouth daily  with breakfast.    [provider]  icosapent  Ethyl (VASCEPA ) 1 g capsule Take 1 capsule (1 g total) by mouth 2 (two) times daily. 04/09/24   Santo Stanly LABOR, MD  icosapent  Ethyl (VASCEPA ) 1 g capsule Take 1 g by mouth 2 (two) times daily.    [provider]  iron  polysaccharides (NIFEREX) 150 MG capsule Take 1 capsule (150 mg total) by mouth daily. Patient not taking: Reported on 08/11/2024 08/06/24   Sebastian Toribio GAILS, MD  isosorbide  mononitrate (IMDUR ) 30 MG 24 hr tablet TAKE 1/2 TABLET(15 MG) BY MOUTH DAILY Patient taking differently: Take 15  mg by mouth daily. 03/06/24   Chandrasekhar, Mahesh A, MD  rosuvastatin  (CRESTOR ) 40 MG tablet TAKE 1 TABLET(40 MG) BY MOUTH DAILY Patient taking differently: Take 40 mg by mouth daily. 04/29/24   Santo Stanly LABOR, MD  sertraline  (ZOLOFT ) 50 MG tablet Take 50 mg by mouth every morning.    [provider]  traMADol  (ULTRAM ) 50 MG tablet Take 1-2 tablets (50-100 mg total) by mouth every 6 (six) hours as needed. For post-op pain. 08/14/24 08/14/25  Alvaro Ricardo KATHEE Mickey., MD  traZODone  (DESYREL ) 150 MG tablet Take 150 mg by mouth at bedtime. 08/24/20   [provider]    Allergies: Erythromycin, Lotensin [benazepril hcl], and Macrodantin [nitrofurantoin]    Review of Systems  Gastrointestinal:  Positive for vomiting.    Updated Vital Signs BP (!) 145/78 (BP Location: Right Arm)   Pulse 68   Temp 97.6 F (36.4 C)   Resp 16   Ht 5' 3 (1.6 m)   Wt 77.6 kg   SpO2 97%   BMI 30.29 kg/m   Physical Exam Vitals and nursing note reviewed.  Constitutional:      General: She is not in acute distress.    Appearance: She is well-developed.  HENT:     Head: Normocephalic and atraumatic.  Eyes:     Conjunctiva/sclera: Conjunctivae normal.  Cardiovascular:     Rate and Rhythm: Normal rate and regular rhythm.     Heart sounds: No murmur heard. Pulmonary:     Effort: Pulmonary effort is normal. No respiratory distress.     Breath sounds: Normal breath sounds.  Abdominal:     Palpations: Abdomen is soft.     Tenderness: There is abdominal tenderness.     Comments: Left lower quadrant abdominal tenderness, soft nondistended  Musculoskeletal:        General: No swelling.     Cervical back: Neck supple.  Skin:    General: Skin is warm and dry.     Capillary Refill: Capillary refill takes less than 2 seconds.  Neurological:     Mental Status: She is alert.  Psychiatric:        Mood and Affect: Mood normal.     (all labs ordered are listed, but only abnormal results  are displayed) Labs Reviewed  CBC WITH DIFFERENTIAL/PLATELET - Abnormal; Notable for the following components:      Result Value   WBC 15.3 (*)    RBC 2.83 (*)    Hemoglobin 8.3 (*)    HCT 25.9 (*)    Neutro Abs 12.3 (*)    Abs Immature Granulocytes 0.09 (*)    All other components within normal limits  COMPREHENSIVE METABOLIC PANEL WITH GFR - Abnormal; Notable for the following components:   CO2 17 (*)    Glucose, Bld 125 (*)    BUN 105 (*)    Creatinine,  Ser 10.06 (*)    Calcium  8.5 (*)    Total Protein 8.2 (*)    Albumin  3.0 (*)    AST 14 (*)    GFR, Estimated 4 (*)    All other components within normal limits  URINALYSIS, ROUTINE W REFLEX MICROSCOPIC - Abnormal; Notable for the following components:   APPearance CLOUDY (*)    Hgb urine dipstick LARGE (*)    Protein, ur 100 (*)    Leukocytes,Ua MODERATE (*)    All other components within normal limits  URINALYSIS, MICROSCOPIC (REFLEX) - Abnormal; Notable for the following components:   Bacteria, UA MANY (*)    All other components within normal limits  URINE CULTURE  LIPASE, BLOOD  RENAL FUNCTION PANEL  CBC  IRON  AND TIBC    EKG: None  Radiology: CT ABDOMEN PELVIS WO CONTRAST Result Date: 09/17/2024 EXAM: CT ABDOMEN AND PELVIS WITHOUT CONTRAST 09/17/2024 04:13:13 PM TECHNIQUE: CT of the abdomen and pelvis was performed without the administration of intravenous contrast. Multiplanar reformatted images are provided for review. Automated exposure control, iterative reconstruction, and/or weight-based adjustment of the mA/kV was utilized to reduce the radiation dose to as low as reasonably achievable. COMPARISON: None available. CLINICAL HISTORY: Acute, nonlocalized abdominal pain, including lower abdominal pain for 2 weeks. FINDINGS: LOWER CHEST: Mild scarring or atelectasis in the lingula and anteriorly in the left lower lobe. Coronary and descending thoracic aortic atherosclerosis. LIVER: The liver is unremarkable.  GALLBLADDER AND BILE DUCTS: Gallbladder is unremarkable. No biliary ductal dilatation. SPLEEN: No acute abnormality. PANCREAS: No acute abnormality. ADRENAL GLANDS: No acute abnormality. KIDNEYS, URETERS AND BLADDER: Right nephrectomy. Left ureteral stent with proximal loop formed in the left kidney upper pole collecting system and distal portion in the urinary bladder. Left perirenal stranding minimally increased from 07/29/2024. Stranding around the left renal collecting system and left ureter. No overt hydronephrosis. No stones in the kidneys or ureters. Urinary bladder contains the distal portion of the left ureteral stent. GI AND BOWEL: Stomach demonstrates no acute abnormality. Sigmoid colon diverticulosis. There is no bowel obstruction. PERITONEUM AND RETROPERITONEUM: No ascites. No free air. VASCULATURE: Substantial atheromatous vascular calcifications of the abdominal aorta and its branches including atheromatous plaque at the origins of the celiac trunk and superior mesenteric artery. Aorta is normal in caliber. LYMPH NODES: Retroperitoneal adenopathy especially eccentric to the left in the periaortic region including a 1.2 cm left periaortic lymph node on image 43 series 2, formerly 1.1 cm. A right gastric node measures 0.8 cm in short axis on image 24 series 2, formerly 0.7 cm. REPRODUCTIVE ORGANS: Uterus absent. BONES AND SOFT TISSUES: Lower thoracic and lumbar spondylosis and degenerative disc disease. No acute osseous abnormality. No focal soft tissue abnormality. IMPRESSION: 1. Left ureteral stent in place with periureteral and pericollecting system stranding and minimally increased left perirenal stranding, without overt hydronephrosis. 2. Retroperitoneal adenopathy with slight interval increase, including a 1.2 cm left periaortic lymph node. 3. Sigmoid colon diverticulosis without evidence of diverticulitis. 4. Lower thoracic and lumbar spondylosis and degenerative disc disease. 5. Several  additional chronic and incidental findings including coronary and descending thoracic aortic atherosclerosis, substantial atheromatous calcifications of the abdominal aorta and branch vessels with plaque at the celiac and superior mesenteric artery origins, right nephrectomy, mild scarring or atelectasis in the lingula and left lower lobe, and status post hysterectomy. Electronically signed by: Ryan Salvage MD 09/17/2024 05:04 PM EST RP Workstation: [id number]     .Critical Care  Performed by: Donnajean Lynwood DEL,  PA-C Authorized by: Donnajean Lynwood DEL, PA-C   Critical care provider statement:    Critical care time (minutes):  30   Critical care was necessary to treat or prevent imminent or life-threatening deterioration of the following conditions:  Renal failure and dehydration   Critical care was time spent personally by me on the following activities:  Development of treatment plan with patient or surrogate, discussions with consultants, evaluation of patient's response to treatment, examination of patient, ordering and review of laboratory studies, ordering and review of radiographic studies, ordering and performing treatments and interventions, pulse oximetry, re-evaluation of patient's condition and review of old charts    Medications Ordered in the ED  lactated ringers  infusion (has no administration in time range)  cefTRIAXone  (ROCEPHIN ) 1 g in sodium chloride  0.9 % 100 mL IVPB (has no administration in time range)  lactated ringers  bolus 1,000 mL (has no administration in time range)    Clinical Course as of 09/17/24 2146  Wed Sep 17, 2024  2103 Patient with history of metastatic urothelial carcinoma status post right nephrectomy evaluated following 2 weeks of lower abdominal pain with associated nausea and vomiting referred here from outpatient urology for concern for AKI.  Upon arrival patient is hemodynamically stable.  She does have some left lower abdominal tenderness.  Lab  work is overall concerning for creatinine of 10.06.  In the 6 range about 3 weeks ago.  Additionally with leukocytosis of 15.3 and a urine concerning for UTI.  Her CT scan is notable for left ureteral stent in place with with periureteral and Perry collecting system stranding without overt hydronephrosis.  Seen by nephrology, Dr. Maryjane this evening who recommended hospitalist admission. [JT]  2146 Discussed patient with hospitalist.  Agreed for admission [JT]    Clinical Course User Index [JT] Donnajean Lynwood DEL, PA-C                                 Medical Decision Making Amount and/or Complexity of Data Reviewed Labs: ordered. Radiology: ordered.  Risk Decision regarding hospitalization.   This patient presents to the ED with chief complaint(s) of abdominal pain.  The complaint involves an extensive differential diagnosis and also carries with it a high risk of complications and morbidity.   Pertinent past medical history as listed in HPI  The differential diagnosis includes  UTI, pyelonephritis, diverticulitis, gastroenteritis, AKI Additional history obtained: Additional history obtained from family Records reviewed Care Everywhere/External Records  Disposition:   Patient be admitted for further workup and management  Social Determinants of Health:   none  This note was dictated with voice recognition software.  Despite best efforts at proofreading, errors may have occurred which can change the documentation meaning.       Final diagnoses:  Acute renal failure, unspecified acute renal failure type    ED Discharge Orders     None          Donnajean Lynwood DEL, PA-C 09/17/24 2146    Ellouise Fine K, DO 09/17/24 2307

## 2024-09-17 NOTE — ED Notes (Signed)
 CCMD called.

## 2024-09-17 NOTE — ED Triage Notes (Addendum)
 Pt. Stated, I was sent over here by my Dr. Jesus had N/V for 3 weeks, I only have one kidney. The Dr. Glenwood I had abnormal labs to do with my kidney also.

## 2024-09-17 NOTE — ED Provider Triage Note (Signed)
 Emergency Medicine Provider Triage Evaluation Note  [name] , a 78 y.o. female  was evaluated in triage.  Pt complains of lower abdominal pain x 2 weeks.  Associate with nausea and vomiting.  Has a history of metastatic urothelial carcinoma.  Sent here to begin HD.  Review of Systems  Positive:  Negative:   Physical Exam  BP (!) 148/73 (BP Location: Right Arm)   Pulse 69   Temp (!) 97.4 F (36.3 C) (Oral)   Resp (!) 22   Ht 5' 3 (1.6 m)   Wt 77.6 kg   SpO2 91%   BMI 30.29 kg/m  Gen:   Awake, no distress   Resp:  Normal effort  MSK:   Moves extremities without difficulty  Other:  Mild left-sided abdominal tenderness  Medical Decision Making  Medically screening exam initiated at 2:22 PM.  Appropriate orders placed.  [name] was informed that the remainder of the evaluation will be completed by another provider, this initial triage assessment does not replace that evaluation, and the importance of remaining in the ED until their evaluation is complete.     Donnajean Lynwood DEL, PA-C 09/17/24 1423

## 2024-09-17 NOTE — ED Triage Notes (Signed)
 Pt tells me that she was sent here from her MD as he said she needs HD>  pt has been feeling unwell, creatnine in November was 6.3.  No CP or sob

## 2024-09-18 ENCOUNTER — Inpatient Hospital Stay (HOSPITAL_COMMUNITY)

## 2024-09-18 ENCOUNTER — Encounter (HOSPITAL_COMMUNITY): Payer: Self-pay | Admitting: Internal Medicine

## 2024-09-18 HISTORY — PX: IR TUNNELED CENTRAL VENOUS CATH PLC W IMG: IMG1939

## 2024-09-18 LAB — CBC
HCT: 23.8 % — ABNORMAL LOW (ref 36.0–46.0)
Hemoglobin: 7.5 g/dL — ABNORMAL LOW (ref 12.0–15.0)
MCH: 28.7 pg (ref 26.0–34.0)
MCHC: 31.5 g/dL (ref 30.0–36.0)
MCV: 91.2 fL (ref 80.0–100.0)
Platelets: 239 K/uL (ref 150–400)
RBC: 2.61 MIL/uL — ABNORMAL LOW (ref 3.87–5.11)
RDW: 15.1 % (ref 11.5–15.5)
WBC: 9.9 K/uL (ref 4.0–10.5)
nRBC: 0 % (ref 0.0–0.2)

## 2024-09-18 LAB — IRON AND TIBC
Iron: 27 ug/dL — ABNORMAL LOW (ref 28–170)
Saturation Ratios: 14 % (ref 10.4–31.8)
TIBC: 189 ug/dL — ABNORMAL LOW (ref 250–450)
UIBC: 162 ug/dL

## 2024-09-18 LAB — RENAL FUNCTION PANEL
Albumin: 2.6 g/dL — ABNORMAL LOW (ref 3.5–5.0)
Anion gap: 12 (ref 5–15)
BUN: 102 mg/dL — ABNORMAL HIGH (ref 8–23)
CO2: 16 mmol/L — ABNORMAL LOW (ref 22–32)
Calcium: 8.3 mg/dL — ABNORMAL LOW (ref 8.9–10.3)
Chloride: 104 mmol/L (ref 98–111)
Creatinine, Ser: 8.99 mg/dL — ABNORMAL HIGH (ref 0.44–1.00)
GFR, Estimated: 4 mL/min — ABNORMAL LOW (ref >60)
Glucose, Bld: 88 mg/dL (ref 70–99)
Phosphorus: 6.2 mg/dL — ABNORMAL HIGH (ref 2.5–4.6)
Potassium: 4.5 mmol/L (ref 3.5–5.1)
Sodium: 132 mmol/L — ABNORMAL LOW (ref 135–145)

## 2024-09-18 LAB — HEPATITIS B SURFACE ANTIGEN: Hepatitis B Surface Ag: NONREACTIVE

## 2024-09-18 MED ORDER — CEFAZOLIN SODIUM-DEXTROSE 1-4 GM/50ML-% IV SOLN
1.0000 g | INTRAVENOUS | Status: AC
  Administered 2024-09-18: 1 g via INTRAVENOUS

## 2024-09-18 MED ORDER — ALUM & MAG HYDROXIDE-SIMETH 200-200-20 MG/5ML PO SUSP
30.0000 mL | ORAL | Status: DC | PRN
  Administered 2024-09-18: 30 mL via ORAL
  Filled 2024-09-18: qty 30

## 2024-09-18 MED ORDER — CHLORHEXIDINE GLUCONATE CLOTH 2 % EX PADS
6.0000 | MEDICATED_PAD | Freq: Every day | CUTANEOUS | Status: DC
  Administered 2024-09-19 – 2024-09-22 (×3): 6 via TOPICAL

## 2024-09-18 MED ORDER — SENNA-DOCUSATE SODIUM 8.6-50 MG PO TABS: 1.0000 | ORAL_TABLET | Freq: Every day | ORAL | Status: DC

## 2024-09-18 MED ORDER — MIDAZOLAM HCL (PF) 2 MG/2ML IJ SOLN
INTRAMUSCULAR | Status: AC | PRN
  Administered 2024-09-18 (×2): 1 mg via INTRAVENOUS

## 2024-09-18 MED ORDER — SERTRALINE HCL 50 MG PO TABS
25.0000 mg | ORAL_TABLET | Freq: Every morning | ORAL | Status: DC
  Administered 2024-09-18 – 2024-09-22 (×5): 25 mg via ORAL
  Filled 2024-09-18 (×5): qty 1

## 2024-09-18 MED ORDER — TRAZODONE HCL 50 MG PO TABS
50.0000 mg | ORAL_TABLET | Freq: Every day | ORAL | Status: DC
  Administered 2024-09-18 – 2024-09-21 (×4): 50 mg via ORAL
  Filled 2024-09-18 (×4): qty 1

## 2024-09-18 MED ORDER — HEPARIN SODIUM (PORCINE) 1000 UNIT/ML IJ SOLN
INTRAMUSCULAR | Status: AC
  Filled 2024-09-18: qty 10

## 2024-09-18 MED ORDER — CEFAZOLIN SODIUM-DEXTROSE 2-4 GM/100ML-% IV SOLN
INTRAVENOUS | Status: AC
  Filled 2024-09-18: qty 100

## 2024-09-18 MED ORDER — SENNOSIDES-DOCUSATE SODIUM 8.6-50 MG PO TABS
1.0000 | ORAL_TABLET | Freq: Every day | ORAL | Status: DC
  Administered 2024-09-18 – 2024-09-22 (×5): 1 via ORAL
  Filled 2024-09-18 (×5): qty 1

## 2024-09-18 MED ORDER — SEVELAMER CARBONATE 800 MG PO TABS
800.0000 mg | ORAL_TABLET | Freq: Three times a day (TID) | ORAL | Status: DC
  Administered 2024-09-18 – 2024-09-22 (×13): 800 mg via ORAL
  Filled 2024-09-18 (×12): qty 1

## 2024-09-18 MED ORDER — MIDAZOLAM HCL 2 MG/2ML IJ SOLN
INTRAMUSCULAR | Status: AC
  Filled 2024-09-18: qty 2

## 2024-09-18 MED ORDER — LIDOCAINE-EPINEPHRINE 1 %-1:100000 IJ SOLN
INTRAMUSCULAR | Status: AC
  Filled 2024-09-18: qty 1

## 2024-09-18 MED ORDER — LIDOCAINE-EPINEPHRINE 1 %-1:100000 IJ SOLN
20.0000 mL | Freq: Once | INTRAMUSCULAR | Status: AC
  Administered 2024-09-18: 20 mL via INTRADERMAL

## 2024-09-18 NOTE — Progress Notes (Signed)
 [facility] Progress Note   Assessment/ Plan:    AKI/CKD stage IV - likely due to volume depletion given 3 weeks of N/V and poor po intake vs progressive CKD to stage V in pt with solitary functioning kidney.  Recommend starting IVF's with lactated ringers  and follow uop and daily Scr.  She will likely need to start dialysis during this admission ut monitoring.  - NPO past MN  - ordered [facility]  - may have progressed to ESRD- will see  - eGFR < 10 since October Refractory N/V - IVF's as above and anti-emetics per primary svc.  Possible recurrent UTI given LLQ pain and UA with leukocytes and bacteria.   Leukocytosis - possible recurrent UTI given stranding of periureteral, peri-collecting system, and perirenal on CT scan.  - improved today  - on CTX Left ureteral carcinoma - s/p stent exchange on 08/14/24 without evidence of hydronephrosis. AGMA - likely due to #1.  Start lactated ringers  and follow. Normocytic anemia - likely due to CKD stage IV.  Hold off on ESA for now given malignancy.  Followed by Oncology.  Transfuse prn Hgb <7.  Continue to follow for now.  HTN - stable    Subjective:    Seen in room.  She is not feeling well- can't eat her breakfast.  Looks like some improvement with IVFs but looking at trend since October her eGFR been < 10 since then- will go ahead and order [facility]. Pt in agreement   Objective:   BP 135/81   Pulse 64   Temp 97.7 F (36.5 C) (Oral)   Resp 16   Ht 5' 3 (1.6 m)   Wt 77.6 kg   SpO2 100%   BMI 30.29 kg/m  No intake or output data in the 24 hours ending 09/18/24 0858 Weight change:   Physical Exam: Gen:NAD CVS RRR Resp clear Jai:dnqu Ext no LE edema  Imaging: CT ABDOMEN PELVIS WO CONTRAST Result Date: 09/17/2024 EXAM: CT ABDOMEN AND PELVIS WITHOUT CONTRAST 09/17/2024 04:13:13 PM TECHNIQUE: CT of the abdomen and pelvis was performed without the administration of intravenous contrast. Multiplanar reformatted images are provided  for review. Automated exposure control, iterative reconstruction, and/or weight-based adjustment of the mA/kV was utilized to reduce the radiation dose to as low as reasonably achievable. COMPARISON: None available. CLINICAL HISTORY: Acute, nonlocalized abdominal pain, including lower abdominal pain for 2 weeks. FINDINGS: LOWER CHEST: Mild scarring or atelectasis in the lingula and anteriorly in the left lower lobe. Coronary and descending thoracic aortic atherosclerosis. LIVER: The liver is unremarkable. GALLBLADDER AND BILE DUCTS: Gallbladder is unremarkable. No biliary ductal dilatation. SPLEEN: No acute abnormality. PANCREAS: No acute abnormality. ADRENAL GLANDS: No acute abnormality. KIDNEYS, URETERS AND BLADDER: Right nephrectomy. Left ureteral stent with proximal loop formed in the left kidney upper pole collecting system and distal portion in the urinary bladder. Left perirenal stranding minimally increased from 07/29/2024. Stranding around the left renal collecting system and left ureter. No overt hydronephrosis. No stones in the kidneys or ureters. Urinary bladder contains the distal portion of the left ureteral stent. GI AND BOWEL: Stomach demonstrates no acute abnormality. Sigmoid colon diverticulosis. There is no bowel obstruction. PERITONEUM AND RETROPERITONEUM: No ascites. No free air. VASCULATURE: Substantial atheromatous vascular calcifications of the abdominal aorta and its branches including atheromatous plaque at the origins of the celiac trunk and superior mesenteric artery. Aorta is normal in caliber. LYMPH NODES: Retroperitoneal adenopathy especially eccentric to the left in the periaortic region including a 1.2 cm  left periaortic lymph node on image 43 series 2, formerly 1.1 cm. A right gastric node measures 0.8 cm in short axis on image 24 series 2, formerly 0.7 cm. REPRODUCTIVE ORGANS: Uterus absent. BONES AND SOFT TISSUES: Lower thoracic and lumbar spondylosis and degenerative disc  disease. No acute osseous abnormality. No focal soft tissue abnormality. IMPRESSION: 1. Left ureteral stent in place with periureteral and pericollecting system stranding and minimally increased left perirenal stranding, without overt hydronephrosis. 2. Retroperitoneal adenopathy with slight interval increase, including a 1.2 cm left periaortic lymph node. 3. Sigmoid colon diverticulosis without evidence of diverticulitis. 4. Lower thoracic and lumbar spondylosis and degenerative disc disease. 5. Several additional chronic and incidental findings including coronary and descending thoracic aortic atherosclerosis, substantial atheromatous calcifications of the abdominal aorta and branch vessels with plaque at the celiac and superior mesenteric artery origins, right nephrectomy, mild scarring or atelectasis in the lingula and left lower lobe, and status post hysterectomy. Electronically signed by: Ryan Salvage MD 09/17/2024 05:04 PM EST RP Workstation: [id number]    Labs: BMET Recent Labs  Lab 09/17/24 1422 09/18/24 0404  NA 135 132*  K 4.6 4.5  CL 106 104  CO2 17* 16*  GLUCOSE 125* 88  BUN 105* 102*  CREATININE 10.06* 8.99*  CALCIUM  8.5* 8.3*  PHOS  --  6.2*   CBC Recent Labs  Lab 09/17/24 1422 09/18/24 0404  WBC 15.3* 9.9  NEUTROABS 12.3*  --   HGB 8.3* 7.5*  HCT 25.9* 23.8*  MCV 91.5 91.2  PLT 266 239    Medications:     heparin   5,000 Units Subcutaneous Q8H   senna-docusate  1 tablet Oral Daily   sertraline   25 mg Oral q morning   sevelamer carbonate  800 mg Oral TID WC   traZODone   50 mg Oral QHS    Almarie Bonine MD 09/18/2024, 8:58 AM

## 2024-09-18 NOTE — Progress Notes (Signed)
 Contacted by nephrologist regarding CLIP to out-pt HD clinic, pt followed by Dr. Dolan. Plan to meet with pt tomorrow to go over details, discuss and CLIP.   [name] Dialysis Nav [phone]

## 2024-09-18 NOTE — ED Notes (Signed)
 Called floor to make aware of PT arrival

## 2024-09-18 NOTE — Procedures (Signed)
 Vascular and Interventional Radiology Procedure Note  Patient: [name] DOB: 06-01-1946 Medical Record Number: [id number] Note Date/Time: 09/18/2024 2:07 PM   Performing Physician: Thom Hall, MD Assistant(s): None  Diagnosis: ESRD requiring Hemodialysis  Procedure: TUNNELED HEMODIALYSIS CATHETER PLACEMENT  Anesthesia: Conscious Sedation Complications: None Estimated Blood Loss: Minimal Specimens:  None  Findings:  Successful placement of right-sided, 19 cm (tip-to-cuff), tunneled hemodialysis catheter with the tip of the catheter in the proximal right atrium.  Plan: Catheter ready for use.  See detailed procedure note with images in PACS. The patient tolerated the procedure well without incident or complication and was returned to Recovery in stable condition.    Thom Hall, MD [facility]   Pager. [phone] Clinic. [phone]

## 2024-09-18 NOTE — ED Notes (Signed)
 Pt ambulated to and from bedside commode, urine culture sent.

## 2024-09-18 NOTE — Plan of Care (Signed)
?  Problem: Education: ?Goal: Knowledge of disease and its progression will improve ?Outcome: Progressing ?  ?Problem: Health Behavior/Discharge Planning: ?Goal: Ability to manage health-related needs will improve ?Outcome: Progressing ?  ?Problem: Clinical Measurements: ?Goal: Complications related to the disease process or treatment will be avoided or minimized ?Outcome: Progressing ?Goal: Dialysis access will remain free of complications ?Outcome: Progressing ?  ?Problem: Activity: ?Goal: Activity intolerance will improve ?Outcome: Progressing ?  ?Problem: Fluid Volume: ?Goal: Fluid volume balance will be maintained or improved ?Outcome: Progressing ?  ?Problem: Nutritional: ?Goal: Ability to make appropriate dietary choices will improve ?Outcome: Progressing ?  ?Problem: Respiratory: ?Goal: Respiratory symptoms related to disease process will be avoided ?Outcome: Progressing ?  ?Problem: Self-Concept: ?Goal: Body image disturbance will be avoided or minimized ?Outcome: Progressing ?  ?Problem: Urinary Elimination: ?Goal: Progression of disease will be identified and treated ?Outcome: Progressing ?  ?

## 2024-09-18 NOTE — Progress Notes (Addendum)
 Brief nephrology follow-up note: The patient was seen and examined.  She is status post right IJ tunneled HD catheter placement by IR.  The catheter site looks clean with no sign of bleeding.  She has progressive CKD to new ESRD with evidence of uremia. -Plan to start dialysis today -Renal navigator was consulted to arrange outpatient dialysis. -Nephrology team will continue to follow. -Discussed with the primary team as well.  Mateo Romney, MD [facility]

## 2024-09-18 NOTE — Progress Notes (Signed)
 [facility]   ROUNDING   NOTE [name] FMW:[id number]  DOB: 09/01/1946  DOA: 09/17/2024  PCP: Chrystal Lamarr RAMAN, MD  09/18/2024,6:10 AM  LOS: 1 day    Code Status: Full code     from: Home   78 year old female history of PE 2022, HTN, HLD OSA not on CPAP Bladder cancer status post right nephroureterectomy for the cancer 11/17/2022-cytology showed high-grade urothelial cancer with BCG treatment 2021  Underwent Jelmyto  instillation 06/2021-cystoscopy 10/2021 showed residual tumor by cytology and she had repeat Jelmyto  infusion 02/25/2022  PET scan is pending in the outpatient setting per Dr. Onesimo  Left stent exchange 08/14/2024 Dr. Matilda CKD 4 baseline creatinine around 3 HFpEF EF 63%  11/19 seen by oncologist in the outpatient setting and seemed pretty stable-at that hospitalization creatinine was 6.3  Evaluated 3-week history nausea vomiting poor p.o. advised by nephrologist Dr. Dolan to go to emergency room as creatinine 7.2      Pertinent imaging/studies till date  Sodium 135 potassium 4.6 chloride 106 bicarb 17 BUN/creatinine 105/10.06 AST/ALT 14/11 WBC 15.3 hemoglobin 8.3 platelet 266 Large hemoglobin moderate leukocytes 100 protein many bacteria CT abdomen chest pelvis left ureteric stent in place with [name] ureteral and [name] collecting system stranding minimally increased left perirenal stranding without hydronephrosis-retroperitoneal adenopathy with slight interval increase including 1.2 cm left periaortic lymph node-diverticulosis   Assessment  & Plan :    AKI superimposed on CKD 4 -follows with nephrologist Dr. Metta creatinine around 3 Underlying constipation worsening issues Creatinine slightly improved-continue LR 75 cc/H--GDMT as below held For constipation use senna 1 tab daily scheduled-would hold calcium  carbonate for now as well as iron  for now as these can exacerbate Expectant management and defer to nephrology further management plans including  plan for dialysis with [facility] this admission-planning per nephrology Start Renvela-avoid Mylanta/magnesium  and discontinued Deferring  anemia of renal disease management to nephrology-hesitate to give IV iron  in setting of possible hematuria and infection-May benefit from Aranesp  Acute UTI with hematuria?  Follow cultures Continues currently on ceftriaxone  2 g every 24-cultures from 09/1011/11 are pending  Bladder cancer status post multiple interventions including BCG Jelmyto  followed with Dr. Onesimo follows with Dr. Matilda CT scan as above shows maybe some stranding and adenopathy-no acute urological needs She should follow-up with Dr. Matilda and get outpatient PET scan with Dr. Onesimo as she has had several instillations of Jelmyto  Holding home tramadol  50-100 for now given elevated creatinine may resume if really needed in the outpatient setting or later today Outpatient goals of care  HFpEF EF 63% previously PTA Coreg  6.25 Imdur  15 Crestor  40 Vascepa  1 twice daily all held----2/2 hematuria aspirin  81 has been held Await decisions about HD and then decide on resuming some of them (unclear Coreg /Crestor  compliance) BP is elevated to conserve perfusion with good pressure --- cover blood pressure >180 systolic with hydralazine  20 every 4 as needed  Previous PE 2022 No longer on anticoagulation although has history of bladder cancer-presume secondary to hematuria Outpatient discussions  DepressioN/insomnia Resume low-dose sertraline  25 trazodone  50 if needs [PTA 50-1 50 respectively] HTN HLD OSA not on CPAP  Data Reviewed today:  Sodium 132 potassium 4.5 bicarb 16 BUN/creatinine 102/8.9 Iron  27 TIBC 189 saturation is 14 WBC 9.9 hemoglobin 7.5 platelet 239   DVT prophylaxis: SCD  Status is: Inpatient Inpatient will require probable planning for HD and clip so likely will be here for several days    Dispo/Global plan: As above   Time 60  Subjective:   Awake coherent  sitting up hungry No fever no chills no nausea no vomiting currently although some abdominal pain-going to the restroom wiped her out Tells me he struggles with constipation chronically at home and he only uses a stool softener      Objective + exam Vitals:   09/18/24 0215 09/18/24 0231 09/18/24 0345 09/18/24 0546  BP: (!) 133/53   (!) 140/65  Pulse: 66  (!) 57 66  Resp: 14  14 16   Temp:  98 F (36.7 C)    TempSrc:  Oral    SpO2: 100%  100% 100%  Weight:      Height:       Filed Weights   09/17/24 1414  Weight: 77.6 kg     Examination: Coherent pleasant white female thick neck Mallampati 4 S1-S2 no murmur Chest is clear no wheeze No CVA tenderness No lower extremity edema ROM intact     Scheduled Meds:  heparin   5,000 Units Subcutaneous Q8H   Continuous Infusions:  cefTRIAXone  (ROCEPHIN )  IV     lactated ringers  75 mL/hr at 09/18/24 0358   acetaminophen  **OR** acetaminophen , alum & mag hydroxide-simeth, hydrALAZINE , ondansetron  **OR** ondansetron  (ZOFRAN ) IV  Heather Jemila Camille, MD  [facility]

## 2024-09-18 NOTE — Consult Note (Addendum)
 Chief Complaint: Patient was seen in consultation today for AKI on CKD 5 Chief Complaint  Patient presents with   Abnormal Lab   Emesis   Nausea   at the request of Gearline Norris   Referring Physician(s): Gearline Norris   Supervising Physician: Hughes Simmonds  Patient Status: [facility] - ED  History of Present Illness: [name] is a 78 y.o. female with PMHs of HTN, PE, CAD, OSA, CHF, kidney  CA s/p right nephrectomy, left kidney CA s/p ablation and left DJ stent in place, and AKI on CKD 5, IR was consulted for tunneled HD catheter placement.   Patient developed 2 weeks of lower abdominal pain with associated nausea and vomiting, seen by nephrology and she was sent to ED 12/10 for further eval and management. Lab showed leukocytosis 15.3, RF with BUN 105, creatinine 10.06, GFR 4. US  showed proteinuria, hematuria, moderate leukocytes and negative nitrite, she was started on rocephin . UA pending. has been afebrile in ED. Patient was admitted and nephrology was consulted who recommended tunneled HD cath placement and initiation of HD.   Patient seen in ED.  Sitting in a stretcher, NDA.  States that she was told that she may need to start HD soon by her outpatient nephrologist.  Karna headache, fever, chills, shortness of breath, cough, chest pain, abdominal pain, nausea ,vomiting, and bleeding.  States that she had a glass of water  while she is in ED, unsure what time.  Informed the patient that she can have sips of water  until procedure is done.    Past Medical History:  Diagnosis Date   Anemia associated with chronic renal failure    Arthritis    Blood dyscrasia 2008   hx of PE   CAD in native artery 10/2019   cardiologist-   dr christella. santo;   a. 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   urologist--- dr dahlstedt/  oncologist--- dr amadeo;   dx 09/ 2020 high  grade urothelial  s/p laser ablation, ;  recurrent s/p BCG instillation,  completed chemo instilation 11/ 2022  and repeat chemo completed 05/ 2023   Chronic combined systolic and diastolic CHF (congestive heart failure) (HCC) 06/2019   followed by cardiology;   dx 09/ 2022 in setting of sepsis, pulm edema;   05/ 2022  ef 40-45% per echo,  recovered per cath 01/ 2021 ef 50%;  laste echo 08/ 2023  ef 60-65%   CKD (chronic kidney disease), stage III (HCC)    Depression    Dysrhythmia    bradycardia   GAD (generalized anxiety disorder)    GERD (gastroesophageal reflux disease)    History of bladder cancer 12/2006   followed by dr matilda   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   dx 04/ 2008  w/ poor function hydronephrotic  right kidney;   02-06-2007  s/p right nephroureterectomy   History of kidney stones    History of pulmonary embolism 04/2022   in setting severe sepsis;  small RLL, treated w/ 3 months eliquis    Hyperlipidemia, mixed    Hypertension    Solitary kidney, acquired 02/06/2007   s/p  right nephroureterectomy for cancer    Past Surgical History:  Procedure Laterality Date   CATARACT EXTRACTION W/ INTRAOCULAR LENS IMPLANT Bilateral 2011   CYSTOSCOPY W/ RETROGRADES Left 01/30/2024   Procedure: CYSTOSCOPY, WITH RETROGRADE PYELOGRAM;  Surgeon: Alvaro Hummer  KATHEE Raddle., MD;  Location: [facility];  Service: Urology;  Laterality: Left;   CYSTOSCOPY W/ RETROGRADES Left 08/14/2024   Procedure: CYSTOSCOPY, WITH RETROGRADE PYELOGRAM;  Surgeon: Alvaro Ricardo KATHEE Raddle., MD;  Location: [facility];  Service: Urology;  Laterality: Left;   CYSTOSCOPY W/ URETERAL STENT PLACEMENT Left 11/17/2019   Procedure: CYSTOSCOPY WITH STENT REPLACEMENT;  Surgeon: Matilda Senior, MD;  Location: [facility];  Service: Urology;  Laterality: Left;   CYSTOSCOPY W/ URETERAL STENT PLACEMENT Left 03/23/2022   Procedure: CYSTOSCOPY WITH RETROGRADE PYELOGRAM, LEFT URETERAL STENT PLACEMENT;  Surgeon:  Matilda Senior, MD;  Location: [facility];  Service: Urology;  Laterality: Left;   CYSTOSCOPY W/ URETERAL STENT PLACEMENT Left 06/09/2022   Procedure: CYSTOSCOPY WITH LEFT STENT EXCHANGE;  Surgeon: Matilda Senior, MD;  Location: [facility];  Service: Urology;  Laterality: Left;   CYSTOSCOPY W/ URETERAL STENT PLACEMENT Left 12/04/2022   Procedure: CYSTOSCOPY WITH STENT REPLACEMENT;  Surgeon: Matilda Senior, MD;  Location: [facility];  Service: Urology;  Laterality: Left;   CYSTOSCOPY W/ URETERAL STENT REMOVAL Left 05/10/2020   Procedure: CYSTOSCOPY WITH STENT REMOVAL;  Surgeon: Matilda Senior, MD;  Location: [facility];  Service: Urology;  Laterality: Left;   CYSTOSCOPY W/ URETERAL STENT REMOVAL Left 05/02/2021   Procedure: CYSTOSCOPY WITH STENT REMOVAL;  Surgeon: Matilda Senior, MD;  Location[facility];  Service: Urology;  Laterality: Left;   CYSTOSCOPY W/ URETERAL STENT REMOVAL Left 11/16/2022   Procedure: CYSTOSCOPY WITH STENT REMOVAL;  Surgeon: Matilda Senior, MD;  Location: [facility];  Service: Urology;  Laterality: Left;   CYSTOSCOPY WITH RETROGRADE PYELOGRAM, URETEROSCOPY AND STENT PLACEMENT Left 06/27/2019   Procedure: CYSTOSCOPY WITH LEFT RETROGRADE PYELOGRAM, URETEROSCOPY, BIOPSY AND LEFT STENT PLACEMENT;  Surgeon: Watt Rush, MD;  Location: [facility];  Service: Urology;  Laterality: Left;   CYSTOSCOPY WITH RETROGRADE PYELOGRAM, URETEROSCOPY AND STENT PLACEMENT Left 08/18/2019   Procedure: CYSTOSCOPY, URETEROSCOPY AND STENT EXCHANGE;  Surgeon: Matilda Senior, MD;  Location: [facility];  Service: Urology;  Laterality: Left;  90 MINS   CYSTOSCOPY WITH RETROGRADE PYELOGRAM, URETEROSCOPY AND STENT PLACEMENT Left 11/17/2019   Procedure: CYSTOSCOPY WITH RETROGRADE PYELOGRAM, URETEROSCOPY AND STENT PLACEMENT;  Surgeon: Matilda Senior, MD;  Location: [facility];  Service: Urology;   Laterality: Left;  90 MINS   CYSTOSCOPY WITH RETROGRADE PYELOGRAM, URETEROSCOPY AND STENT PLACEMENT Left 05/10/2020   Procedure: CYSTOSCOPY WITH RETROGRADE PYELOGRAM, URETEROSCOPY AND STENT PLACEMENT WITH URETHRAL DIALATION AND BRUSH BIOPSY;  Surgeon: Matilda Senior, MD;  Location: [facility];  Service: Urology;  Laterality: Left;  1 HR   CYSTOSCOPY WITH RETROGRADE PYELOGRAM, URETEROSCOPY AND STENT PLACEMENT Left 09/16/2020   Procedure: CYSTOSCOPY WITH RETROGRADE PYELOGRAM, URETEROSCOPY ,  LITHOPAXY, LEFT URETERAL BRUSHING, AND STENT REPLACEMENT;  Surgeon: Matilda Senior, MD;  Location: [facility];  Service: Urology;  Laterality: Left;   CYSTOSCOPY WITH RETROGRADE PYELOGRAM, URETEROSCOPY AND STENT PLACEMENT Left 10/24/2021   Procedure: CYSTOSCOPY WITH RETROGRADE PYELOGRAM, URETEROSCOPY, POSSIBLE URETERAL AND RENAL BIOPSIES AND STENT PLACEMENT;  Surgeon: Matilda Senior, MD;  Location: [facility];  Service: Urology;  Laterality: Left;  1 HR   CYSTOSCOPY WITH RETROGRADE PYELOGRAM, URETEROSCOPY AND STENT PLACEMENT Left 03/09/2022   Procedure: CYSTOSCOPY WITH ANTEGRADE PYELOGRAM,  STENT REMOVAL;  Surgeon: Matilda Senior, MD;  Location: [facility];  Service: Urology;  Laterality: Left;   CYSTOSCOPY WITH RETROGRADE PYELOGRAM, URETEROSCOPY AND STENT PLACEMENT  01/09/2007   @[facility]  by  dr matilda;    bx's/ washing  bladder , right ureter   CYSTOSCOPY WITH RETROGRADE PYELOGRAM, URETEROSCOPY AND STENT PLACEMENT Left 11/16/2022   Procedure: CYSTOSCOPY WITH RETROGRADE PYELOGRAM, URETEROSCOPY AND STENT REPLACEMENT;  Surgeon: Matilda Senior, MD;  Location: [facility];  Service: Urology;  Laterality: Left;  90 MINS   CYSTOSCOPY WITH RETROGRADE PYELOGRAM, URETEROSCOPY AND STENT PLACEMENT Left 07/06/2023   Procedure: CYSTOSCOPY WITH LEFT RETROGRADE PYELOGRAM, URETEROSCOPY, LASER ABLATION OF TUMOR AND STENT EXCHANGE;  Surgeon:  Alvaro Ricardo KATHEE Mickey., MD;  Location: [facility];  Service: Urology;  Laterality: Left;  60 MINUTES NEEDED FOR CASE   CYSTOSCOPY/URETEROSCOPY/HOLMIUM LASER Left 12/04/2022   Procedure: CYSTOSCOPY/URETEROSCOPY/  HOLMIUM LASER OF RENAL PELVIC LESIONS;  Surgeon: Matilda Senior, MD;  Location: [facility];  Service: Urology;  Laterality: Left;   CYSTOSCOPY/URETEROSCOPY/HOLMIUM LASER/STENT PLACEMENT Left 05/02/2021   Procedure: CYSTOSCOPY/URETEROSCOPY WITH BRUSH BIOPSY/ RETROGRADE PYELOGRAM/ HOLMIUM LASER/STENT REPLACEMENT;  Surgeon: Matilda Senior, MD;  Location: [facility];  Service: Urology;  Laterality: Left;   CYSTOSCOPY/URETEROSCOPY/HOLMIUM LASER/STENT PLACEMENT Left 01/30/2024   Procedure: CYSTOSCOPY/DIAGNOSTIC URETEROSCOPY/ RETROGRADE/STENT EXCHANGE;  Surgeon: Alvaro Ricardo KATHEE Mickey., MD;  Location: [facility];  Service: Urology;  Laterality: Left;   CYSTOSCOPY/URETEROSCOPY/HOLMIUM LASER/STENT PLACEMENT Left 08/14/2024   Procedure: CYSTOSCOPY/DIAGNOSTIC URETEROSCOPY/STENT EXCHANGE;  Surgeon: Alvaro Ricardo KATHEE Mickey., MD;  Location: [facility];  Service: Urology;  Laterality: Left;   HOLMIUM LASER APPLICATION Left 09/16/2020   Procedure: HOLMIUM LASER APPLICATION;  Surgeon: Matilda Senior, MD;  Location: [facility];  Service: Urology;  Laterality: Left;   HOLMIUM LASER APPLICATION Left 10/24/2021   Procedure: HOLMIUM LASER APPLICATION OF TUMORS;  Surgeon: Matilda Senior, MD;  Location: [facility];  Service: Urology;  Laterality: Left;   HOLMIUM LASER APPLICATION Left 11/16/2022   Procedure: HOLMIUM LASER OF TUMORS;  Surgeon: Matilda Senior, MD;  Location: [facility];  Service: Urology;  Laterality: Left;   IR NEPHROSTOMY PLACEMENT LEFT  07/06/2021   IR NEPHROSTOMY PLACEMENT LEFT  02/06/2022   NEPHROSTOMY TUBE REMOVAL Left 03/23/2022   Procedure: NEPHROSTOMY TUBE REMOVAL;  Surgeon: Matilda Senior, MD;  Location:  [facility];  Service: Urology;  Laterality: Left;   NEPHROURETERECTOMY Right 02/06/2007   @[facility]  by dr matilda;   Laparoscopic   RIGHT/LEFT HEART CATH AND CORONARY ANGIOGRAPHY N/A 11/06/2019   Procedure: RIGHT/LEFT HEART CATH AND CORONARY ANGIOGRAPHY;  Surgeon: Claudene Victory ORN, MD;  Location: [facility];  Service: Cardiovascular;  Laterality: N/A;   THULIUM LASER TURP (TRANSURETHRAL RESECTION OF PROSTATE) Left 08/18/2019   Procedure: THULIUM LASER ABLATION OF URETERAL TUMOR;  Surgeon: Matilda Senior, MD;  Location: [facility];  Service: Urology;  Laterality: Left;   THULIUM LASER TURP (TRANSURETHRAL RESECTION OF PROSTATE) Left 11/17/2019   Procedure: THULIUM LASER of URETERAL CANCER;  Surgeon: Matilda Senior, MD;  Location: [facility];  Service: Urology;  Laterality: Left;   TRANSURETHRAL RESECTION OF BLADDER TUMOR  12/12/2006   @[facility]  by dr andra   VAGINAL HYSTERECTOMY  1980    Allergies: Erythromycin, Macrodantin [nitrofurantoin], and Lotensin [benazepril hcl]  Medications: Prior to Admission medications  Medication Sig Start Date End Date Taking? Authorizing Provider  acetaminophen  (TYLENOL ) 500 MG tablet Take 500-1,000 mg by mouth every 6 (six) hours as needed for mild pain.   Yes [provider]  aspirin  EC 81 MG tablet Take 1 tablet (81 mg total) by mouth daily. Swallow whole. 01/09/23  Yes Parthenia Olivia HERO, PA-C  Calcium   Carbonate Antacid (TUMS PO) Take 1 tablet by mouth daily as needed (heartburn).   Yes [provider]  carvedilol  (COREG ) 6.25 MG tablet TAKE 1 TABLET(6.25 MG) BY MOUTH TWICE DAILY WITH A MEAL 02/04/24  Yes Chandrasekhar, Mahesh A, MD  cholecalciferol  (VITAMIN D3) 25 MCG (1000 UNIT) tablet Take 1,000 Units by mouth every morning.   Yes [provider]  famotidine  (PEPCID ) 10 MG tablet Take 1 tablet by mouth daily.   Yes [provider]  ferrous sulfate  325 (65 FE) MG tablet Take 325 mg by  mouth daily with breakfast.   Yes [provider]  icosapent  Ethyl (VASCEPA ) 1 g capsule Take 1 capsule (1 g total) by mouth 2 (two) times daily. 04/09/24  Yes Chandrasekhar, Mahesh A, MD  isosorbide  mononitrate (IMDUR ) 30 MG 24 hr tablet TAKE 1/2 TABLET(15 MG) BY MOUTH DAILY 03/06/24  Yes Chandrasekhar, Mahesh A, MD  rosuvastatin  (CRESTOR ) 40 MG tablet TAKE 1 TABLET(40 MG) BY MOUTH DAILY 04/29/24  Yes Chandrasekhar, Mahesh A, MD  sennosides-docusate sodium  (SENOKOT-S) 8.6-50 MG tablet Take 1 tablet by mouth daily.   Yes [provider]  sertraline  (ZOLOFT ) 50 MG tablet Take 50 mg by mouth every morning.   Yes [provider]  traMADol  (ULTRAM ) 50 MG tablet Take 1-2 tablets (50-100 mg total) by mouth every 6 (six) hours as needed. For post-op pain. 08/14/24 08/14/25 Yes Manny, Ricardo KATHEE Raddle., MD  traZODone  (DESYREL ) 150 MG tablet Take 150 mg by mouth at bedtime. 08/24/20  Yes [provider]     Family History  Problem Relation Age of Onset   Hypertension Mother    Diabetes Mellitus II Sister    Hypertension Sister    Hypertension Brother    BRCA 1/2 Neg Hx    Breast cancer Neg Hx     Social History   Socioeconomic History   Marital status: Married    Spouse name: Not on file   Number of children: Not on file   Years of education: Not on file   Highest education level: Not on file  Occupational History   Not on file  Tobacco Use   Smoking status: Former    Current packs/day: 0.00    Average packs/day: 0.5 packs/day for 38.0 years (19.0 ttl pk-yrs)    Types: Cigarettes    Start date: 11/25/1965    Quit date: 11/26/2003    Years since quitting: 20.8   Smokeless tobacco: Never  Vaping Use   Vaping status: Never Used  Substance and Sexual Activity   Alcohol use: Not Currently   Drug use: Never   Sexual activity: Yes    Birth control/protection: Surgical, Post-menopausal  Other Topics Concern   Not on file  Social History Narrative   Not on file    Social Drivers of Health   Tobacco Use: Medium Risk (09/18/2024)   Patient History    Smoking Tobacco Use: Former    Smokeless Tobacco Use: Never    Passive Exposure: Not on Actuary Strain: Not on file  Food Insecurity: No Food Insecurity (07/29/2024)   Epic    Worried About Programme Researcher, Broadcasting/film/video in the Last Year: Never true    Ran Out of Food in the Last Year: Never true  Transportation Needs: No Transportation Needs (07/29/2024)   Epic    Lack of Transportation (Medical): No    Lack of Transportation (Non-Medical): No  Physical Activity: Not on file  Stress: Not on file  Social  Connections: Socially Integrated (07/29/2024)   Social Connection and Isolation Panel    Frequency of Communication with Friends and Family: More than three times a week    Frequency of Social Gatherings with Friends and Family: Three times a week    Attends Religious Services: More than 4 times per year    Active Member of Clubs or Organizations: Yes    Attends Banker Meetings: Never    Marital Status: Married  Depression (PHQ2-9): Not on file  Alcohol Screen: Not on file  Housing: Low Risk (07/29/2024)   Epic    Unable to Pay for Housing in the Last Year: No    Number of Times Moved in the Last Year: 0    Homeless in the Last Year: No  Utilities: Not At Risk (07/29/2024)   Epic    Threatened with loss of utilities: No  Health Literacy: Not on file     Review of Systems: A 12 point ROS discussed and pertinent positives are indicated in the HPI above.  All other systems are negative.  Vital Signs: BP (!) 142/67 (BP Location: Right Arm)   Pulse 63   Temp 98.2 F (36.8 C) (Oral)   Resp 19   Ht 5' 3 (1.6 m)   Wt 171 lb (77.6 kg)   SpO2 100%   BMI 30.29 kg/m    Physical Exam Vitals and nursing note reviewed.  Constitutional:      General: Patient is not in acute distress.    Appearance: Normal appearance. Patient is not ill-appearing.  HENT:     Head:  Normocephalic and atraumatic.     Mouth/Throat:     Mouth: Mucous membranes are moist.     Pharynx: Oropharynx is clear.  Cardiovascular:     Rate and Rhythm: Normal rate and regular rhythm.     Pulses: Normal pulses.     Heart sounds: Normal heart sounds.  Pulmonary:     Effort: Pulmonary effort is normal.     Breath sounds: Normal breath sounds.  Abdominal:     General: Abdomen is flat. Bowel sounds are normal.     Palpations: Abdomen is soft.  Musculoskeletal:     Cervical back: Neck supple.  Skin:    General: Skin is warm and dry.     Coloration: Skin is not jaundiced or pale.  Neurological:     Mental Status: Patient is alert and oriented to person, place, and time.  Psychiatric:        Mood and Affect: Mood normal.        Behavior: Behavior normal.        Judgment: Judgment normal.    MD Evaluation Airway: WNL Heart: WNL Abdomen: WNL Chest/ Lungs: WNL ASA  Classification: 3 Mallampati/Airway Score: Two  Imaging: CT ABDOMEN PELVIS WO CONTRAST Result Date: 09/17/2024 EXAM: CT ABDOMEN AND PELVIS WITHOUT CONTRAST 09/17/2024 04:13:13 PM TECHNIQUE: CT of the abdomen and pelvis was performed without the administration of intravenous contrast. Multiplanar reformatted images are provided for review. Automated exposure control, iterative reconstruction, and/or weight-based adjustment of the mA/kV was utilized to reduce the radiation dose to as low as reasonably achievable. COMPARISON: None available. CLINICAL HISTORY: Acute, nonlocalized abdominal pain, including lower abdominal pain for 2 weeks. FINDINGS: LOWER CHEST: Mild scarring or atelectasis in the lingula and anteriorly in the left lower lobe. Coronary and descending thoracic aortic atherosclerosis. LIVER: The liver is unremarkable. GALLBLADDER AND BILE DUCTS: Gallbladder is unremarkable. No biliary ductal dilatation. SPLEEN: No  acute abnormality. PANCREAS: No acute abnormality. ADRENAL GLANDS: No acute abnormality. KIDNEYS,  URETERS AND BLADDER: Right nephrectomy. Left ureteral stent with proximal loop formed in the left kidney upper pole collecting system and distal portion in the urinary bladder. Left perirenal stranding minimally increased from 07/29/2024. Stranding around the left renal collecting system and left ureter. No overt hydronephrosis. No stones in the kidneys or ureters. Urinary bladder contains the distal portion of the left ureteral stent. GI AND BOWEL: Stomach demonstrates no acute abnormality. Sigmoid colon diverticulosis. There is no bowel obstruction. PERITONEUM AND RETROPERITONEUM: No ascites. No free air. VASCULATURE: Substantial atheromatous vascular calcifications of the abdominal aorta and its branches including atheromatous plaque at the origins of the celiac trunk and superior mesenteric artery. Aorta is normal in caliber. LYMPH NODES: Retroperitoneal adenopathy especially eccentric to the left in the periaortic region including a 1.2 cm left periaortic lymph node on image 43 series 2, formerly 1.1 cm. A right gastric node measures 0.8 cm in short axis on image 24 series 2, formerly 0.7 cm. REPRODUCTIVE ORGANS: Uterus absent. BONES AND SOFT TISSUES: Lower thoracic and lumbar spondylosis and degenerative disc disease. No acute osseous abnormality. No focal soft tissue abnormality. IMPRESSION: 1. Left ureteral stent in place with periureteral and pericollecting system stranding and minimally increased left perirenal stranding, without overt hydronephrosis. 2. Retroperitoneal adenopathy with slight interval increase, including a 1.2 cm left periaortic lymph node. 3. Sigmoid colon diverticulosis without evidence of diverticulitis. 4. Lower thoracic and lumbar spondylosis and degenerative disc disease. 5. Several additional chronic and incidental findings including coronary and descending thoracic aortic atherosclerosis, substantial atheromatous calcifications of the abdominal aorta and branch vessels with plaque  at the celiac and superior mesenteric artery origins, right nephrectomy, mild scarring or atelectasis in the lingula and left lower lobe, and status post hysterectomy. Electronically signed by: Ryan Salvage MD 09/17/2024 05:04 PM EST RP Workstation: [id number]    Labs:  CBC: Recent Labs    08/05/24 0512 08/27/24 1101 09/17/24 1422 09/18/24 0404  WBC 8.6 9.9 15.3* 9.9  HGB 8.7* 9.0* 8.3* 7.5*  HCT 28.0* 28.2* 25.9* 23.8*  PLT 185 237 266 239    COAGS: No results for input(s): INR, APTT in the last 8760 hours.  BMP: Recent Labs    08/14/24 1146 08/27/24 1101 09/17/24 1422 09/18/24 0404  NA 133* 135 135 132*  K 4.8 4.6 4.6 4.5  CL 102 102 106 104  CO2 16* 19* 17* 16*  GLUCOSE 106* 137* 125* 88  BUN 65* 65* 105* 102*  CALCIUM  8.7* 9.0 8.5* 8.3*  CREATININE 6.68* 6.30* 10.06* 8.99*  GFRNONAA 6* 6* 4* 4*    LIVER FUNCTION TESTS: Recent Labs    07/29/24 0947 07/30/24 0519 08/01/24 0530 08/05/24 0512 08/27/24 1101 09/17/24 1422 09/18/24 0404  BILITOT 0.2 0.2  --   --  <0.2 0.5  --   AST 17 17  --   --  17 14*  --   ALT 20 20  --   --  14 11  --   ALKPHOS 61 67  --   --  67 65  --   PROT 8.1 7.1  --   --  7.8 8.2*  --   ALBUMIN  3.5 3.3*   < > 3.1* 3.6 3.0* 2.6*   < > = values in this interval not displayed.    TUMOR MARKERS: No results for input(s): AFPTM, CEA, CA199, CHROMGRNA in the last 8760 hours.  Assessment and Plan: 78 y.o.  female with renal CA s/p right nephrectomy and left renal ablation and left DJ stent in place, AKI on CKD who is in need of HD access.   VSS CBC today w/o leukocytosis, had leukocytosis with left shift yesterday, Dr. Hughes notified. OK to proceed with tunneled HD cath.  BMP w/o hyperkalemia  On rocephin  2g for possible UTI - received at 0830 hrs today  1 g ancef  during the procedure per Dr. Hughes, ordered.   Risks and benefits discussed with the patient including, but not limited to bleeding, infection,  vascular injury, pneumothorax which may require chest tube placement, air embolism or even death  All of the patient's questions were answered, patient is agreeable to proceed. Consent signed and in IR.    Thank you for this interesting consult.  I greatly enjoyed meeting SHATONYA PASSON and look forward to participating in their care.  A copy of this report was sent to the requesting provider on this date.  Electronically Signed: Toya VEAR Cousin, PA-C 09/18/2024, 11:11 AM   I spent a total of 20 Minutes    in face to face in clinical consultation, greater than 50% of which was counseling/coordinating care for tunneled HD cath placement.   This chart was dictated using voice recognition software.  Despite best efforts to proofread,  errors can occur which can change the documentation meaning.

## 2024-09-19 LAB — RENAL FUNCTION PANEL
Albumin: 2.5 g/dL — ABNORMAL LOW (ref 3.5–5.0)
Anion gap: 11 (ref 5–15)
BUN: 52 mg/dL — ABNORMAL HIGH (ref 8–23)
CO2: 23 mmol/L (ref 22–32)
Calcium: 7.9 mg/dL — ABNORMAL LOW (ref 8.9–10.3)
Chloride: 101 mmol/L (ref 98–111)
Creatinine, Ser: 5.63 mg/dL — ABNORMAL HIGH (ref 0.44–1.00)
GFR, Estimated: 7 mL/min — ABNORMAL LOW (ref >60)
Glucose, Bld: 77 mg/dL (ref 70–99)
Phosphorus: 4 mg/dL (ref 2.5–4.6)
Potassium: 3.9 mmol/L (ref 3.5–5.1)
Sodium: 135 mmol/L (ref 135–145)

## 2024-09-19 LAB — URINE CULTURE

## 2024-09-19 LAB — HEPATITIS B SURFACE ANTIBODY, QUANTITATIVE: Hep B S AB Quant (Post): <3.5 m[IU]/mL — ABNORMAL LOW

## 2024-09-19 NOTE — Plan of Care (Signed)
°  Problem: Education: Goal: Knowledge of disease and its progression will improve Outcome: Progressing   Problem: Health Behavior/Discharge Planning: Goal: Ability to manage health-related needs will improve Outcome: Progressing   Problem: Clinical Measurements: Goal: Complications related to the disease process or treatment will be avoided or minimized Outcome: Progressing Goal: Dialysis access will remain free of complications Outcome: Progressing   Problem: Activity: Goal: Activity intolerance will improve Outcome: Progressing   Problem: Fluid Volume: Goal: Fluid volume balance will be maintained or improved Outcome: Progressing   Problem: Nutritional: Goal: Ability to make appropriate dietary choices will improve Outcome: Progressing   Problem: Respiratory: Goal: Respiratory symptoms related to disease process will be avoided Outcome: Progressing   Problem: Self-Concept: Goal: Body image disturbance will be avoided or minimized Outcome: Progressing   Problem: Urinary Elimination: Goal: Progression of disease will be identified and treated Outcome: Progressing   Problem: Education: Goal: Knowledge of disease and its progression will improve Outcome: Progressing Goal: Individualized Educational Video(s) Outcome: Progressing   Problem: Fluid Volume: Goal: Compliance with measures to maintain balanced fluid volume will improve Outcome: Progressing   Problem: Health Behavior/Discharge Planning: Goal: Ability to manage health-related needs will improve Outcome: Progressing   Problem: Nutritional: Goal: Ability to make healthy dietary choices will improve Outcome: Progressing   Problem: Clinical Measurements: Goal: Complications related to the disease process, condition or treatment will be avoided or minimized Outcome: Progressing

## 2024-09-19 NOTE — Progress Notes (Addendum)
 [facility]   ROUNDING   NOTE JANITA CAMBEROS FMW:[id number]  DOB: April 02, 1946  DOA: 09/17/2024  PCP: Chrystal Lamarr RAMAN, MD  09/19/2024,9:57 AM  LOS: 2 days    Code Status: Full code     from: Home   78 year old female history of PE 2022, HTN, HLD OSA not on CPAP Bladder cancer status post right nephroureterectomy for the cancer 11/17/2022-cytology showed high-grade urothelial cancer with BCG treatment 2021  Underwent Jelmyto  instillation 06/2021-cystoscopy 10/2021 showed residual tumor by cytology and she had repeat Jelmyto  infusion 02/25/2022  PET scan is pending in the outpatient setting per Dr. Onesimo  Left stent exchange 08/14/2024 Dr. Matilda CKD 4 baseline creatinine around 3 HFpEF EF 63%  11/19 seen by oncologist in the outpatient setting and seemed pretty stable-at that hospitalization creatinine was 6.3  Evaluated 3-week history nausea vomiting poor p.o. advised by nephrologist Dr. Dolan to go to emergency room as creatinine 7.2      Pertinent imaging/studies till date  Sodium 135 potassium 4.6 chloride 106 bicarb 17 BUN/creatinine 105/10.06 AST/ALT 14/11 WBC 15.3 hemoglobin 8.3 platelet 266 Large hemoglobin moderate leukocytes 100 protein many bacteria CT abdomen chest pelvis left ureteric stent in place with [name] ureteral and [name] collecting system stranding minimally increased left perirenal stranding without hydronephrosis-retroperitoneal adenopathy with slight interval increase including 1.2 cm left periaortic lymph node-diverticulosis 12/11 TDC placed   Assessment  & Plan :    AKI superimposed on CKD 4 -follows with nephrologist Dr. Metta creatinine around 3 Underlying constipation worsening issues Planning for HD Renvela 800 3 times daily  Deferring  anemia of renal disease management to nephrology-hesitate to give IV iron  in setting of possible hematuria and infection-neph neurology holding ESA secondary to malignancy  Acute UTI with hematuria?  Follow  cultures Completed 3 days ceftriaxone  2 g every 24-multiple species on cultures-no further workup right now Watch for recurrent fever or significant hematuria  Bladder cancer status post multiple interventions including BCG Jelmyto  followed with Dr. Onesimo follows with Dr. Dahlstedt-recent stent exchange 08/14/2024 CT scan as above shows maybe some stranding and adenopathy-no acute urological needs but if develops pain fever nausea may need them to see OP Dr. Matilda and get outpatient PET scan with Dr. Onesimo for Jelmyto  Holding home tramadol  50-100 2/2 creatinine-outpatient discussion goals of care  HFpEF EF 63% previously PTA Coreg  6.25 Imdur  15 Crestor  40 Vascepa  1 twice daily all held----2/2 hematuria aspirin  81 has been held BP is elevated to conserve perfusion with good pressure --- cover blood pressure >180 systolic with hydralazine  20 every 4 as needed  Previous PE 2022 No longer on anticoagulation although has history of bladder cancer-presume secondary to hematuria Outpatient discussions-aspirin  held going forward  DepressioN/insomnia Resume low-dose sertraline  25 trazodone  50 if needs [PTA 50-1 50 respectively] and adjust back to home doses once on HD HTN HLD-see above discussion OSA not on CPAP  Data Reviewed today:   Sodium 135 potassium 3.9 bicarb 23 BUN/creatinine 52/5.6   DVT prophylaxis: SCD  Status is: Inpatient Inpatient will require probable planning for HD and clip so likely will be here for several days    Dispo/Global plan: As above   Time 40   Subjective:   Little nausea but was able to keep down some food No fever no chills no chest pain   Objective + exam Vitals:   09/19/24 0444 09/19/24 0715 09/19/24 0745 09/19/24 0809  BP: (!) 172/71 (!) 176/88  (!) 134/54  Pulse: 74 79  95  Resp: 18 18  18   Temp: 97.9 F (36.6 C) 98.3 F (36.8 C)  97.9 F (36.6 C)  TempSrc:  Axillary    SpO2: 97% 98%  91%  Weight:   76.2 kg   Height:   5' 3 (1.6 m)     Filed Weights   09/17/24 1414 09/18/24 1630 09/19/24 0745  Weight: 77.6 kg 76.2 kg 76.2 kg     Examination: Coherent pleasant white female thick neck Mallampati 4 S1-S2 no murmur on monitors has some bradycardia Chest is clear no wheeze Abdomen soft no rebound no guarding no CVA tenderness no lower extremity edema  Scheduled Meds:  Chlorhexidine  Gluconate Cloth  6 each Topical Q0600   heparin   5,000 Units Subcutaneous Q8H   senna-docusate  1 tablet Oral Daily   sertraline   25 mg Oral q morning   sevelamer carbonate  800 mg Oral TID WC   traZODone   50 mg Oral QHS   Continuous Infusions:  cefTRIAXone  (ROCEPHIN )  IV 2 g (09/19/24 0732)   acetaminophen  **OR** acetaminophen , hydrALAZINE , ondansetron  **OR** ondansetron  (ZOFRAN ) IV  Jai-Gurmukh Verdie Wilms, MD  [facility]

## 2024-09-19 NOTE — Progress Notes (Addendum)
 Met at bedside with pt to discuss out-pt HD options. Pt would like to please stay with Dr. Dolan and go to [facility] on a MWF, 1st shift if possible. Pt states her husband will transport her to these appointments is aware she may not get the schedule preferences she wants as this clinic is showing at full capacity. Will begin submitting referral to [facility] at this time.   Lavanda Amrutha [facility] Navigator [phone]  Addendum 1044am CLIP completed. Awaiting financial and medical clearance at this time.

## 2024-09-19 NOTE — Progress Notes (Addendum)
 [facility] Progress Note   Assessment/ Plan:    AKI/CKD stage IV--> progression to ESRD - likely due to volume depletion given 3 weeks of N/V and poor po intake vs progressive CKD to stage V in pt with solitary functioning kidney.  Recommend starting IVF's with lactated ringers  and follow uop and daily Scr.  She will likely need to start dialysis during this admission ut monitoring.  - TDC placed 09/18/24  - HD #1 12/11, next HD 12/13  - will need CLIP- in process  - will need perm access too  - eGFR < 10 since October Refractory N/V - IVF's as above and anti-emetics per primary svc.  Possible recurrent UTI given LLQ pain and UA with leukocytes and bacteria.   Leukocytosis - possible recurrent UTI given stranding of periureteral, peri-collecting system, and perirenal on CT scan.  - improved today  - on CTX Left ureteral carcinoma - s/p stent exchange on 08/14/24 without evidence of hydronephrosis. AGMA - likely due to #1.  Start lactated ringers  and follow. Normocytic anemia - likely due to CKD stage IV.  Hold off on ESA for now given malignancy.  Followed by Oncology.  Transfuse prn Hgb <7.  Continue to follow for now.  HTN - stable    Subjective:    HD #1 yesterday.  For HD #2 tomorrow   Objective:   BP (!) 134/54 (BP Location: Right Arm)   Pulse 95   Temp 97.9 F (36.6 C)   Resp 18   Ht 5' 3 (1.6 m)   Wt 76.2 kg   SpO2 91%   BMI 29.76 kg/m   Intake/Output Summary (Last 24 hours) at 09/19/2024 1318 Last data filed at 09/19/2024 1007 Gross per 24 hour  Intake 432.43 ml  Output 1 ml  Net 431.43 ml   Weight change: -1.361 kg  Physical Exam: Gen:NAD CVS RRR Resp clear Jai:dnqu Ext no LE edema  Imaging: IR TUNNELED CENTRAL VENOUS CATH [facility] W IMG Result Date: 09/18/2024 INDICATION: ESRD requiring HD initiation. EXAM: TUNNELED CENTRAL VENOUS HEMODIALYSIS CATHETER PLACEMENT WITH ULTRASOUND AND FLUOROSCOPIC GUIDANCE MEDICATIONS: Ancef  1 gm IV. The  antibiotic was given in an appropriate time interval prior to skin puncture. ANESTHESIA/SEDATION: Local anesthetic and single agent sedation was employed during this procedure. A total of Versed  2 mg was administered intravenously. The patient's level of consciousness and vital signs were monitored continuously by radiology nursing throughout the procedure under my direct supervision. FLUOROSCOPY: Radiation Exposure Index and estimated peak skin dose (PSD); Reference air kerma (RAK), 2.4 mGy. COMPLICATIONS: None immediate. PROCEDURE: Informed written consent was obtained from the patient after a discussion of the risks, benefits, and alternatives to treatment. Questions regarding the procedure were encouraged and answered. The RIGHT neck and chest were prepped with chlorhexidine  in a sterile fashion, and a sterile drape was applied covering the operative field. Maximum barrier sterile technique with sterile gowns and gloves were used for the procedure. A timeout was performed prior to the initiation of the procedure. After creating a small venotomy incision, a micropuncture kit was utilized to access the internal jugular vein. Real-time ultrasound guidance was utilized for vascular access including the acquisition of a permanent ultrasound image documenting patency of the accessed vessel. The microwire was utilized to measure appropriate catheter length. A stiff Glidewire was advanced to the level of the IVC and the micropuncture sheath was exchanged for a peel-away sheath. A palindrome tunneled hemodialysis catheter measuring 19 cm from tip to cuff was tunneled  in a retrograde fashion from the anterior chest wall to the venotomy incision. The catheter was then placed through the peel-away sheath with tips ultimately positioned within the superior aspect of the right atrium. Final catheter positioning was confirmed and documented with a spot radiographic image. The catheter aspirates and flushes normally. The  catheter was flushed with appropriate volume heparin  dwells. The catheter exit site was secured with a 0-Prolene retention suture. The venotomy incision was closed with Dermabond. Dressings were applied. The patient tolerated the procedure well without immediate post procedural complication. IMPRESSION: Successful placement of 19 cm tip to cuff tunneled hemodialysis catheter via the RIGHT internal jugular vein. The tip of the catheter is positioned at the superior cavo-atrial junction. The catheter is ready for immediate use. Thom Hall, MD [facility] Radiology Electronically Signed   By: Thom Hall M.D.   On: 09/18/2024 16:51   CT ABDOMEN PELVIS WO CONTRAST Result Date: 09/17/2024 EXAM: CT ABDOMEN AND PELVIS WITHOUT CONTRAST 09/17/2024 04:13:13 PM TECHNIQUE: CT of the abdomen and pelvis was performed without the administration of intravenous contrast. Multiplanar reformatted images are provided for review. Automated exposure control, iterative reconstruction, and/or weight-based adjustment of the mA/kV was utilized to reduce the radiation dose to as low as reasonably achievable. COMPARISON: None available. CLINICAL HISTORY: Acute, nonlocalized abdominal pain, including lower abdominal pain for 2 weeks. FINDINGS: LOWER CHEST: Mild scarring or atelectasis in the lingula and anteriorly in the left lower lobe. Coronary and descending thoracic aortic atherosclerosis. LIVER: The liver is unremarkable. GALLBLADDER AND BILE DUCTS: Gallbladder is unremarkable. No biliary ductal dilatation. SPLEEN: No acute abnormality. PANCREAS: No acute abnormality. ADRENAL GLANDS: No acute abnormality. KIDNEYS, URETERS AND BLADDER: Right nephrectomy. Left ureteral stent with proximal loop formed in the left kidney upper pole collecting system and distal portion in the urinary bladder. Left perirenal stranding minimally increased from 07/29/2024. Stranding around the left renal  collecting system and left ureter. No overt hydronephrosis. No stones in the kidneys or ureters. Urinary bladder contains the distal portion of the left ureteral stent. GI AND BOWEL: Stomach demonstrates no acute abnormality. Sigmoid colon diverticulosis. There is no bowel obstruction. PERITONEUM AND RETROPERITONEUM: No ascites. No free air. VASCULATURE: Substantial atheromatous vascular calcifications of the abdominal aorta and its branches including atheromatous plaque at the origins of the celiac trunk and superior mesenteric artery. Aorta is normal in caliber. LYMPH NODES: Retroperitoneal adenopathy especially eccentric to the left in the periaortic region including a 1.2 cm left periaortic lymph node on image 43 series 2, formerly 1.1 cm. A right gastric node measures 0.8 cm in short axis on image 24 series 2, formerly 0.7 cm. REPRODUCTIVE ORGANS: Uterus absent. BONES AND SOFT TISSUES: Lower thoracic and lumbar spondylosis and degenerative disc disease. No acute osseous abnormality. No focal soft tissue abnormality. IMPRESSION: 1. Left ureteral stent in place with periureteral and pericollecting system stranding and minimally increased left perirenal stranding, without overt hydronephrosis. 2. Retroperitoneal adenopathy with slight interval increase, including a 1.2 cm left periaortic lymph node. 3. Sigmoid colon diverticulosis without evidence of diverticulitis. 4. Lower thoracic and lumbar spondylosis and degenerative disc disease. 5. Several additional chronic and incidental findings including coronary and descending thoracic aortic atherosclerosis, substantial atheromatous calcifications of the abdominal aorta and branch vessels with plaque at the celiac and superior mesenteric artery origins, right nephrectomy, mild scarring or atelectasis in the lingula and left lower lobe, and status post hysterectomy. Electronically signed by: Ryan Salvage MD 09/17/2024 05:04 PM EST  RP Workstation: [id number]     Labs: BMET Recent Labs  Lab 09/17/24 1422 09/18/24 0404 09/19/24 0458  NA 135 132* 135  K 4.6 4.5 3.9  CL 106 104 101  CO2 17* 16* 23  GLUCOSE 125* 88 77  BUN 105* 102* 52*  CREATININE 10.06* 8.99* 5.63*  CALCIUM  8.5* 8.3* 7.9*  PHOS  --  6.2* 4.0   CBC Recent Labs  Lab 09/17/24 1422 09/18/24 0404  WBC 15.3* 9.9  NEUTROABS 12.3*  --   HGB 8.3* 7.5*  HCT 25.9* 23.8*  MCV 91.5 91.2  PLT 266 239    Medications:     Chlorhexidine  Gluconate Cloth  6 each Topical Q0600   heparin   5,000 Units Subcutaneous Q8H   senna-docusate  1 tablet Oral Daily   sertraline   25 mg Oral q morning   sevelamer carbonate  800 mg Oral TID WC   traZODone   50 mg Oral QHS    Almarie Bonine MD 09/19/2024, 1:18 PM

## 2024-09-20 DIAGNOSIS — N179 Acute kidney failure, unspecified: Secondary | ICD-10-CM | POA: Diagnosis not present

## 2024-09-20 LAB — RENAL FUNCTION PANEL
Albumin: 2.6 g/dL — ABNORMAL LOW (ref 3.5–5.0)
Anion gap: 10 (ref 5–15)
BUN: 51 mg/dL — ABNORMAL HIGH (ref 8–23)
CO2: 23 mmol/L (ref 22–32)
Calcium: 8.1 mg/dL — ABNORMAL LOW (ref 8.9–10.3)
Chloride: 104 mmol/L (ref 98–111)
Creatinine, Ser: 6.09 mg/dL — ABNORMAL HIGH (ref 0.44–1.00)
GFR, Estimated: 7 mL/min — ABNORMAL LOW (ref >60)
Glucose, Bld: 82 mg/dL (ref 70–99)
Phosphorus: 4.7 mg/dL — ABNORMAL HIGH (ref 2.5–4.6)
Potassium: 4.3 mmol/L (ref 3.5–5.1)
Sodium: 137 mmol/L (ref 135–145)

## 2024-09-20 LAB — CBC
HCT: 26.6 % — ABNORMAL LOW (ref 36.0–46.0)
Hemoglobin: 8.4 g/dL — ABNORMAL LOW (ref 12.0–15.0)
MCH: 29.3 pg (ref 26.0–34.0)
MCHC: 31.6 g/dL (ref 30.0–36.0)
MCV: 92.7 fL (ref 80.0–100.0)
Platelets: 139 K/uL — ABNORMAL LOW (ref 150–400)
RBC: 2.87 MIL/uL — ABNORMAL LOW (ref 3.87–5.11)
RDW: 15.5 % (ref 11.5–15.5)
WBC: 9.1 K/uL (ref 4.0–10.5)
nRBC: 0 % (ref 0.0–0.2)

## 2024-09-20 MED ORDER — ALTEPLASE 2 MG IJ SOLR: 2.0000 mg | Freq: Once | INTRAMUSCULAR | Status: DC | PRN

## 2024-09-20 MED ORDER — LIDOCAINE HCL (PF) 1 % IJ SOLN: 5.0000 mL | INTRAMUSCULAR | Status: DC | PRN

## 2024-09-20 MED ORDER — LIDOCAINE-PRILOCAINE 2.5-2.5 % EX CREA: 1.0000 | TOPICAL_CREAM | CUTANEOUS | Status: DC | PRN

## 2024-09-20 MED ORDER — PENTAFLUOROPROP-TETRAFLUOROETH EX AERO: 1.0000 | INHALATION_SPRAY | CUTANEOUS | Status: DC | PRN

## 2024-09-20 MED ORDER — HEPARIN SODIUM (PORCINE) 1000 UNIT/ML IJ SOLN
3200.0000 [IU] | Freq: Once | INTRAMUSCULAR | Status: AC
  Administered 2024-09-20: 3200 [IU]

## 2024-09-20 MED ORDER — CARVEDILOL 3.125 MG PO TABS
3.1250 mg | ORAL_TABLET | Freq: Two times a day (BID) | ORAL | Status: DC
  Administered 2024-09-20 – 2024-09-21 (×3): 3.125 mg via ORAL
  Filled 2024-09-20 (×3): qty 1

## 2024-09-20 MED ORDER — ANTICOAGULANT SODIUM CITRATE 4% (200MG/5ML) IV SOLN: 5.0000 mL | Status: DC | PRN

## 2024-09-20 MED ORDER — HEPARIN SODIUM (PORCINE) 1000 UNIT/ML DIALYSIS: 1000.0000 [IU] | INTRAMUSCULAR | Status: DC | PRN

## 2024-09-20 MED ORDER — HEPARIN SODIUM (PORCINE) 1000 UNIT/ML IJ SOLN
INTRAMUSCULAR | Status: AC
  Filled 2024-09-20: qty 4

## 2024-09-20 NOTE — Progress Notes (Signed)
 [facility]   ROUNDING   NOTE ISMA TIETJE FMW:[id number]  DOB: [birth year]  DOA: 09/17/2024  PCP: Chrystal Lamarr RAMAN, MD  09/20/2024,8:26 AM  LOS: 3 days    Code Status: Full code     from: Home   78 year old female history of PE 2022, HTN, HLD OSA not on CPAP Bladder cancer status post right nephroureterectomy for the cancer 11/17/2022-cytology showed high-grade urothelial cancer with BCG treatment 2021  Underwent Jelmyto  instillation 06/2021-cystoscopy 10/2021 showed residual tumor by cytology and she had repeat Jelmyto  infusion 02/25/2022  PET scan is pending in the outpatient setting per Dr. Onesimo  Left stent exchange 08/14/2024 Dr. Matilda CKD 4 baseline creatinine around 3 HFpEF EF 63%  11/19 seen by oncologist in the outpatient setting and seemed pretty stable-at that hospitalization creatinine was 6.3  Evaluated 3-week history nausea vomiting poor p.o. advised by nephrologist Dr. Dolan to go to emergency room as creatinine 7.2      Pertinent imaging/studies till date  Sodium 135 potassium 4.6 chloride 106 bicarb 17 BUN/creatinine 105/10.06 AST/ALT 14/11 WBC 15.3 hemoglobin 8.3 platelet 266 Large hemoglobin moderate leukocytes 100 protein many bacteria CT abdomen chest pelvis left ureteric stent in place with [name] ureteral and [name] collecting system stranding minimally increased left perirenal stranding without hydronephrosis-retroperitoneal adenopathy with slight interval increase including 1.2 cm left periaortic lymph node-diverticulosis 12/11 TDC placed and first dialysis performed    Assessment  & Plan :   Longstanding nausea--possibly related to uremia, ?related to treated urinary infection Tells me that this happens at home that she occasionally has this and that she occasionally vomits-no flank pain I do not think that this is a persistent urinary infection as no rigors no dysuria and no hematuria If she vomits again however, I have a low threshold to restart  antibiotics as per below bolded annotation and discus with urology  AKI superimposed on CKD 4 -follows with nephrologist Dr. Metta creatinine around 3 Underlying constipation worsening issues Defer to renal-starting back beta-blocker today as hypertensive at lower dose as below Renvela  800 3 times daily  Probably can give IV iron  as presumed UTI has been treated--no ESA because of malignancy  Acute UTI with hematuria-multiple species on culture 12/10 Completed 3 days ceftriaxone  2 g every 24-no further workup no further antibiotics Watch for recurrent fever or significant hematuria  Bladder cancer status post multiple interventions including BCG Jelmyto  followed with Dr. Onesimo follows with Dr. Dahlstedt-recent stent exchange 08/14/2024 CT scan as above shows maybe some stranding and adenopathy-no acute urological needs but if develops pain fever nausea may need them to see OP Dr. Matilda and get outpatient PET scan with Dr. Onesimo--- discussion as outpatient regarding Jelmyto  Holding home tramadol  50-100 2/2 creatinine and would probably not resume in the outpatient setting as she is not asking specifically for pain meds  HFpEF EF 63% previously PTA Coreg  6.25 Imdur  15 Crestor  40 Vascepa  1 twice daily all held---at admission Giving Coreg  3.125 twice daily from today -2/2 hematuria aspirin  81 has been held BP is elevated to conserve perfusion with good pressure --- cover blood pressure >180 systolic with hydralazine  20 every 4 as needed  Previous PE 2022 No longer on anticoagulation although has history of bladder cancer-presume secondary to hematuria Outpatient discussions-aspirin  held going forward  DepressioN/insomnia Resume low-dose sertraline  25 trazodone  50 if needs [PTA 50-1 50 respectively] a--- likely taper or discontinue as an outpatient HTN HLD-see above discussion OSA not on CPAP  Data Reviewed today:   Sodium  137 potassium 4.3 BUN/creatinine 51/6.0 WBC 9.1  hemoglobin 8.4 platelet 139   DVT prophylaxis: SCD  Status is: Inpatient  [facility] referral for [facility] MWF is completed as of 12/12 worker-she will need permanent access in the next 24 to 48 hours and once we challenge EDW per nephrology may be discharge?  Dispo/Global plan: As above   Time 40   Subjective:   Seen on HD unit this morning nausea relieved by Zofran  this morning-tells me that it is a pretty typical presentation for her in the mornings at home-had some cereal and was able to keep it down 2 episodes of vomiting yesterday  Objective + exam Vitals:   09/20/24 0427 09/20/24 0430 09/20/24 0804 09/20/24 0824  BP: (!) 179/70 (!) 172/78 (!) 160/54 (!) 158/69  Pulse: 81 82 86 82  Resp: 18 18 14 15   Temp: 98.2 F (36.8 C) (!) 97.3 F (36.3 C) 97.6 F (36.4 C)   TempSrc:   Oral   SpO2: 94% 97% 97% 98%  Weight:   71.8 kg   Height:       Filed Weights   09/18/24 1630 09/19/24 0745 09/20/24 0804  Weight: 76.2 kg 76.2 kg 71.8 kg     Examination:  Pleasant coherent no distress Dialysis catheter in place on right side at HD Abdomen soft no rebound S1-S2 no murmur ROM intact Power 5/5 Psych is euthymic coherent  Scheduled Meds:  carvedilol   3.125 mg Oral BID WC   Chlorhexidine  Gluconate Cloth  6 each Topical Q0600   heparin   5,000 Units Subcutaneous Q8H   senna-docusate  1 tablet Oral Daily   sertraline   25 mg Oral q morning   sevelamer  carbonate  800 mg Oral TID WC   traZODone   50 mg Oral QHS   Continuous Infusions:  anticoagulant sodium citrate      acetaminophen  **OR** acetaminophen , alteplase , anticoagulant sodium citrate , heparin , hydrALAZINE , lidocaine  (PF), lidocaine -prilocaine , ondansetron  **OR** ondansetron  (ZOFRAN ) IV, pentafluoroprop-tetrafluoroeth  Jai-Gurmukh Shene Maxfield, MD  [facility]

## 2024-09-20 NOTE — Progress Notes (Signed)
 Nutrition Education Note  RD consulted for Renal Education. RD working remotely, pt currently in HD.SABRA All handouts provided in AVS. Provided handouts in AVS related to low sodium, low potassium and low phosphorus food alternatives.   Handouts explain why diet restrictions are needed and provided lists of foods to limit/avoid that are high potassium, sodium, and phosphorus. Provided specific recommendations on safer alternatives of these foods.   Handouts discuss importance of protein intake at each meal and snack. Provided examples of how to maximize protein intake throughout the day. Handouts discuss need for fluid restriction with dialysis, importance of minimizing weight gain between HD treatments, and renal-friendly beverage options.  Expect Fair compliance.  Body mass index is 28.04 kg/m.  Current diet order is Renal diet with 1200 ml fluids restriction, patient is consuming approximately 100% of meals at this time. Labs and medications reviewed. No further nutrition interventions warranted at this time. RD contact information provided. If additional nutrition issues arise, please re-consult RD.  Olivia Kenning, RD Registered Dietitian  See Amion for more information

## 2024-09-20 NOTE — Procedures (Signed)
 Patient seen and examined on Hemodialysis. The procedure was supervised and I have made appropriate changes. BP (!) 163/75   Pulse 69   Temp 97.6 F (36.4 C) (Oral)   Resp (!) 22   Ht 5' 3 (1.6 m)   Wt 71.8 kg   SpO2 98%   BMI 28.04 kg/m   QB 300 mL/ min via TDC, UF goal 2L  Tolerating treatment without complaints at this time.   Almarie Bonine MD [facility] [phone] 10:33 AM

## 2024-09-20 NOTE — Progress Notes (Signed)
 Received patient in bed to unit.  Alert and oriented.  Informed consent signed and in chart.   TX duration:3 hours  Patient tolerated well.  Transported back to the room  Alert, without acute distress.  Hand-off given to patient's nurse.   Access used: R internal jugular HD cath Access issues: A-V and V-A due to high arterial pressures  Total UF removed: 0ml   09/20/24 1134  Vitals  Temp (!) 97.4 F (36.3 C)  Temp Source Oral  BP (!) 146/72  Pulse Rate 68  ECG Heart Rate 70  Resp 16  Oxygen Therapy  SpO2 97 %  O2 Device Room Air  During Treatment Monitoring  Duration of HD Treatment -hour(s) 3 hour(s)  HD Safety Checks Performed Yes  Intra-Hemodialysis Comments Tx completed  Post Treatment  Dialyzer Clearance Clear  Liters Processed 50.2  Fluid Removed (mL) 0 mL  Tolerated HD Treatment Yes  Hemodialysis Catheter Right Internal jugular Double lumen Permanent (Tunneled)  Placement Date/Time: 09/18/24 1414   Serial / Lot #: [id number]  Expiration Date: 03/08/29  Time Out: Correct patient;Correct site;Correct procedure  Maximum sterile barrier precautions: Hand hygiene;Cap;Mask;Sterile gown;Sterile gloves;Large sterile s...  Site Condition No complications  Blue Lumen Status Flushed;Antimicrobial dead end cap;Heparin  locked  Red Lumen Status Flushed;Antimicrobial dead end cap;Heparin  locked  Purple Lumen Status N/A  Catheter fill solution Heparin  1000 units/ml  Catheter fill volume (Arterial) 1.6 cc  Catheter fill volume (Venous) 1.6  Dressing Type Transparent  Dressing Status Clean, Dry, Intact  Drainage Description None  Dressing Change Due 09/25/24     Camellia Brasil LPN [facility]

## 2024-09-20 NOTE — Progress Notes (Signed)
 [facility] Progress Note   Assessment/ Plan:    AKI/CKD stage IV--> progression to ESRD - likely due to volume depletion given 3 weeks of N/V and poor po intake vs progressive CKD to stage V in pt with solitary functioning kidney.  Recommend starting IVF's with lactated ringers  and follow uop and daily Scr.  She will likely need to start dialysis during this admission ut monitoring.  - TDC placed 09/18/24  - HD #1 12/11, next HD 12/13  - will need CLIP- in process  - will need perm access too-- will order vein mapping, plan to c/s VVS monday  - eGFR < 10 since October Refractory N/V - IVF's as above and anti-emetics per primary svc.  Possible recurrent UTI given LLQ pain and UA with leukocytes and bacteria.   Leukocytosis - possible recurrent UTI given stranding of periureteral, peri-collecting system, and perirenal on CT scan.  - improved today  - on CTX Left ureteral carcinoma - s/p stent exchange on 08/14/24 without evidence of hydronephrosis. AGMA - likely due to #1.  improved Normocytic anemia - likely due to CKD stage IV.  Hold off on ESA for now given malignancy.  Followed by Oncology.  Transfuse prn Hgb <7.  Continue to follow for now.  HTN - stable    Subjective:    Seen and examined.  Still has some nausea.  Overall improving   Objective:   BP (!) 163/75   Pulse 69   Temp 97.6 F (36.4 C) (Oral)   Resp (!) 22   Ht 5' 3 (1.6 m)   Wt 71.8 kg   SpO2 98%   BMI 28.04 kg/m   Intake/Output Summary (Last 24 hours) at 09/20/2024 1032 Last data filed at 09/20/2024 0524 Gross per 24 hour  Intake 240 ml  Output 0 ml  Net 240 ml   Weight change: 0 kg  Physical Exam: Gen:NAD CVS RRR Resp clear Jai:dnqu Ext no LE edema  Imaging: IR TUNNELED CENTRAL VENOUS CATH [facility] W IMG Result Date: 09/18/2024 INDICATION: ESRD requiring HD initiation. EXAM: TUNNELED CENTRAL VENOUS HEMODIALYSIS CATHETER PLACEMENT WITH ULTRASOUND AND FLUOROSCOPIC GUIDANCE MEDICATIONS:  Ancef  1 gm IV. The antibiotic was given in an appropriate time interval prior to skin puncture. ANESTHESIA/SEDATION: Local anesthetic and single agent sedation was employed during this procedure. A total of Versed  2 mg was administered intravenously. The patient's level of consciousness and vital signs were monitored continuously by radiology nursing throughout the procedure under my direct supervision. FLUOROSCOPY: Radiation Exposure Index and estimated peak skin dose (PSD); Reference air kerma (RAK), 2.4 mGy. COMPLICATIONS: None immediate. PROCEDURE: Informed written consent was obtained from the patient after a discussion of the risks, benefits, and alternatives to treatment. Questions regarding the procedure were encouraged and answered. The RIGHT neck and chest were prepped with chlorhexidine  in a sterile fashion, and a sterile drape was applied covering the operative field. Maximum barrier sterile technique with sterile gowns and gloves were used for the procedure. A timeout was performed prior to the initiation of the procedure. After creating a small venotomy incision, a micropuncture kit was utilized to access the internal jugular vein. Real-time ultrasound guidance was utilized for vascular access including the acquisition of a permanent ultrasound image documenting patency of the accessed vessel. The microwire was utilized to measure appropriate catheter length. A stiff Glidewire was advanced to the level of the IVC and the micropuncture sheath was exchanged for a peel-away sheath. A palindrome tunneled hemodialysis catheter measuring 19 cm  from tip to cuff was tunneled in a retrograde fashion from the anterior chest wall to the venotomy incision. The catheter was then placed through the peel-away sheath with tips ultimately positioned within the superior aspect of the right atrium. Final catheter positioning was confirmed and documented with a spot radiographic image. The catheter aspirates and flushes  normally. The catheter was flushed with appropriate volume heparin  dwells. The catheter exit site was secured with a 0-Prolene retention suture. The venotomy incision was closed with Dermabond. Dressings were applied. The patient tolerated the procedure well without immediate post procedural complication. IMPRESSION: Successful placement of 19 cm tip to cuff tunneled hemodialysis catheter via the RIGHT internal jugular vein. The tip of the catheter is positioned at the superior cavo-atrial junction. The catheter is ready for immediate use. Thom Hall, MD [facility] Radiology Electronically Signed   By: Thom Hall M.D.   On: 09/18/2024 16:51    Labs: BMET Recent Labs  Lab 09/17/24 1422 09/18/24 0404 09/19/24 0458 09/20/24 0543  NA 135 132* 135 137  K 4.6 4.5 3.9 4.3  CL 106 104 101 104  CO2 17* 16* 23 23  GLUCOSE 125* 88 77 82  BUN 105* 102* 52* 51*  CREATININE 10.06* 8.99* 5.63* 6.09*  CALCIUM  8.5* 8.3* 7.9* 8.1*  PHOS  --  6.2* 4.0 4.7*   CBC Recent Labs  Lab 09/17/24 1422 09/18/24 0404 09/20/24 0735  WBC 15.3* 9.9 9.1  NEUTROABS 12.3*  --   --   HGB 8.3* 7.5* 8.4*  HCT 25.9* 23.8* 26.6*  MCV 91.5 91.2 92.7  PLT 266 239 139*    Medications:     carvedilol   3.125 mg Oral BID WC   Chlorhexidine  Gluconate Cloth  6 each Topical Q0600   heparin   5,000 Units Subcutaneous Q8H   senna-docusate  1 tablet Oral Daily   sertraline   25 mg Oral q morning   sevelamer  carbonate  800 mg Oral TID WC   traZODone   50 mg Oral QHS    Almarie Bonine MD 09/20/2024, 10:32 AM

## 2024-09-21 ENCOUNTER — Inpatient Hospital Stay (HOSPITAL_COMMUNITY)

## 2024-09-21 LAB — RENAL FUNCTION PANEL
Albumin: 2.7 g/dL — ABNORMAL LOW (ref 3.5–5.0)
Anion gap: 10 (ref 5–15)
BUN: 25 mg/dL — ABNORMAL HIGH (ref 8–23)
CO2: 27 mmol/L (ref 22–32)
Calcium: 8.3 mg/dL — ABNORMAL LOW (ref 8.9–10.3)
Chloride: 98 mmol/L (ref 98–111)
Creatinine, Ser: 4 mg/dL — ABNORMAL HIGH (ref 0.44–1.00)
GFR, Estimated: 11 mL/min — ABNORMAL LOW (ref >60)
Glucose, Bld: 102 mg/dL — ABNORMAL HIGH (ref 70–99)
Phosphorus: 3.3 mg/dL (ref 2.5–4.6)
Potassium: 3.7 mmol/L (ref 3.5–5.1)
Sodium: 135 mmol/L (ref 135–145)

## 2024-09-21 MED ORDER — TRAZODONE HCL 50 MG PO TABS
100.0000 mg | ORAL_TABLET | Freq: Once | ORAL | Status: AC
  Administered 2024-09-21: 100 mg via ORAL
  Filled 2024-09-21: qty 2

## 2024-09-21 MED ORDER — POLYETHYLENE GLYCOL 3350 17 G PO PACK
17.0000 g | PACK | Freq: Every day | ORAL | Status: DC | PRN
  Administered 2024-09-21: 17 g via ORAL
  Filled 2024-09-21: qty 1

## 2024-09-21 MED ORDER — TRAZODONE HCL 50 MG PO TABS: 150.0000 mg | ORAL_TABLET | Freq: Every day | ORAL | Status: DC

## 2024-09-21 NOTE — Progress Notes (Addendum)
 [facility] Progress Note   Assessment/ Plan:    AKI/CKD stage IV--> progression to ESRD - likely due to volume depletion given 3 weeks of N/V and poor po intake vs progressive CKD to stage V in pt with solitary functioning kidney.  Recommend starting IVF's with lactated ringers  and follow uop and daily Scr.  She will likely need to start dialysis during this admission ut monitoring.  - TDC placed 09/18/24  - HD #1 12/11, HD 12/13  - will need CLIP- in process  - will need perm access too-- ordered vein mapping, not completed yet, will c/s VVS in AM  - eGFR < 10 since October  - HD #3 tomorrow 12/15 Refractory N/V - IVF's as above and anti-emetics per primary svc.  Possible recurrent UTI given LLQ pain and UA with leukocytes and bacteria.   Leukocytosis - possible recurrent UTI given stranding of periureteral, peri-collecting system, and perirenal on CT scan.  - improved today  - on CTX Left ureteral carcinoma - s/p stent exchange on 08/14/24 without evidence of hydronephrosis. AGMA - likely due to #1.  improved Normocytic anemia - likely due to CKD stage IV.  Hold off on ESA for now given malignancy.  Followed by Oncology.  Transfuse prn Hgb <7.  Continue to follow for now.  HTN - stable    Subjective:    Seen and examined.  Looks better today.  Still a little queasy but improving   Objective:   BP (!) 142/64 (BP Location: Right Arm)   Pulse 76   Temp 98.1 F (36.7 C) (Oral)   Resp 19   Ht 5' 3 (1.6 m)   Wt 71.8 kg   SpO2 99%   BMI 28.04 kg/m   Intake/Output Summary (Last 24 hours) at 09/21/2024 1035 Last data filed at 09/21/2024 0826 Gross per 24 hour  Intake 360 ml  Output 0 ml  Net 360 ml   Weight change: -4.404 kg  Physical Exam: Gen:NAD CVS RRR Resp clear Jai:dnqu Ext no LE edema  Imaging: No results found.   Labs: BMET Recent Labs  Lab 09/17/24 1422 09/18/24 0404 09/19/24 0458 09/20/24 0543 09/21/24 0610  NA 135 132* 135 137  135  K 4.6 4.5 3.9 4.3 3.7  CL 106 104 101 104 98  CO2 17* 16* 23 23 27   GLUCOSE 125* 88 77 82 102*  BUN 105* 102* 52* 51* 25*  CREATININE 10.06* 8.99* 5.63* 6.09* 4.00*  CALCIUM  8.5* 8.3* 7.9* 8.1* 8.3*  PHOS  --  6.2* 4.0 4.7* 3.3   CBC Recent Labs  Lab 09/17/24 1422 09/18/24 0404 09/20/24 0735  WBC 15.3* 9.9 9.1  NEUTROABS 12.3*  --   --   HGB 8.3* 7.5* 8.4*  HCT 25.9* 23.8* 26.6*  MCV 91.5 91.2 92.7  PLT 266 239 139*    Medications:     carvedilol   3.125 mg Oral BID WC   Chlorhexidine  Gluconate Cloth  6 each Topical Q0600   heparin   5,000 Units Subcutaneous Q8H   senna-docusate  1 tablet Oral Daily   sertraline   25 mg Oral q morning   sevelamer  carbonate  800 mg Oral TID WC   traZODone   50 mg Oral QHS    Almarie Bonine MD 09/21/2024, 10:35 AM

## 2024-09-21 NOTE — Progress Notes (Signed)
 [facility] night cross cover note:   The patient conveys that, at home, she takes trazodone  150 mg p.o. nightly as opposed to the currently ordered trazodone  50 mg p.o. nightly, notes that she has been experiencing some insomnia last few nights on the current dose of 50 mg p.o. nightly.  It is noted that she has already received 50 mg of trazodone  this evening.  I subsequently ordered trazodone  100 mg p.o. x 1 dose now, and modified order for nightly trazodone  to reflect 150 mg p.o. nightly, with first dose of the latter to occur tomorrow night.     Eva Pore, DO Hospitalist

## 2024-09-21 NOTE — Hospital Course (Signed)
 78 y.o. F with HTN, urothelial cancer status post right nephro ureterectomy 2024, D CHF, history of PE, OSA not on CPAP, and baseline CKD 4 creatinine around 3 who presented to nephrologist office with nausea vomiting, found to have worsening renal failure.  Admitted, TDC placed, began dialysis.

## 2024-09-21 NOTE — Progress Notes (Signed)
°  Progress Note   Patient: [name] FMW:[id number] DOB: [birth year] DOA: 09/17/2024     4 DOS: the patient was seen and examined on 09/21/2024 at 9:24AM      Brief hospital course: 78 y.o. F with HTN, urothelial cancer status post right nephro ureterectomy 2024, D CHF, history of PE, OSA not on CPAP, and baseline CKD 4 creatinine around 3 who presented to nephrologist office with nausea vomiting, found to have worsening renal failure.  Admitted, TDC placed, began dialysis.     Assessment and Plan: Acute renal failure on CKD 4 Acidosis Uremia Hyponatremia - Dialysis per nephrology   Nausea This seems to be improving with dialysis  Possible UTI Urine culture multiple species, completed 3 days of Rocephin .  Chronic diastolic congestive heart failure Hypertension History of PE Appears euvolemic - Volume management with HD - Continue carvedilol  - Hold aspirin , Vascepa , Imdur , Crestor  for now  OSA Not on CPAP at home  History of bladder cancer  Anemia of chronic kidney disease Hemoglobin stable, no clinical bleeding.       Subjective: Feeling somewhat less nausea, no vomiting last 24 hours, no fever, no confusion, no swelling or respiratory distress      Physical Exam: BP (!) 142/64 (BP Location: Right Arm)   Pulse 76   Temp 98.1 F (36.7 C) (Oral)   Resp 19   Ht 5' 3 (1.6 m)   Wt 71.8 kg   SpO2 99%   BMI 28.04 kg/m   Female, lying in bed, interactive and appropriate RRR, no murmurs, no peripheral edema Respiratory rate normal, lungs clear without rales or wheezes Abdomen soft, no tenderness palpation or guarding, no ascites or distention Oriented x 3, face symmetric, upper extremity strength normal    Data Reviewed: Basic metabolic panel shows normal sodium and potassium, creatinine elevated to 2 worsening renal failure CBC yesterday showed stable hemoglobin    Family Communication:     Disposition: Status is:  Inpatient         Author: Lonni SHAUNNA Dalton, MD 09/21/2024 10:56 AM  For on call review [email or web address].

## 2024-09-22 ENCOUNTER — Other Ambulatory Visit (HOSPITAL_COMMUNITY): Payer: Self-pay

## 2024-09-22 LAB — RENAL FUNCTION PANEL
Albumin: 2.7 g/dL — ABNORMAL LOW (ref 3.5–5.0)
Anion gap: 8 (ref 5–15)
BUN: 32 mg/dL — ABNORMAL HIGH (ref 8–23)
CO2: 28 mmol/L (ref 22–32)
Calcium: 8.3 mg/dL — ABNORMAL LOW (ref 8.9–10.3)
Chloride: 101 mmol/L (ref 98–111)
Creatinine, Ser: 4.78 mg/dL — ABNORMAL HIGH (ref 0.44–1.00)
GFR, Estimated: 9 mL/min — ABNORMAL LOW (ref >60)
Glucose, Bld: 84 mg/dL (ref 70–99)
Phosphorus: 3.8 mg/dL (ref 2.5–4.6)
Potassium: 3.9 mmol/L (ref 3.5–5.1)
Sodium: 137 mmol/L (ref 135–145)

## 2024-09-22 LAB — CBC
HCT: 26 % — ABNORMAL LOW (ref 36.0–46.0)
Hemoglobin: 8.2 g/dL — ABNORMAL LOW (ref 12.0–15.0)
MCH: 29 pg (ref 26.0–34.0)
MCHC: 31.5 g/dL (ref 30.0–36.0)
MCV: 91.9 fL (ref 80.0–100.0)
Platelets: 183 K/uL (ref 150–400)
RBC: 2.83 MIL/uL — ABNORMAL LOW (ref 3.87–5.11)
RDW: 15.2 % (ref 11.5–15.5)
WBC: 9 K/uL (ref 4.0–10.5)
nRBC: 0 % (ref 0.0–0.2)

## 2024-09-22 MED ORDER — ONDANSETRON HCL 4 MG PO TABS
4.0000 mg | ORAL_TABLET | Freq: Four times a day (QID) | ORAL | 0 refills | Status: AC | PRN
  Filled 2024-09-22: qty 30, 8d supply, fill #0

## 2024-09-22 MED ORDER — CEFAZOLIN SODIUM-DEXTROSE 2-4 GM/100ML-% IV SOLN: 2.0000 g | INTRAVENOUS | Status: DC

## 2024-09-22 MED ORDER — SEVELAMER CARBONATE 800 MG PO TABS
800.0000 mg | ORAL_TABLET | Freq: Three times a day (TID) | ORAL | 0 refills | Status: AC
  Filled 2024-09-22: qty 90, 30d supply, fill #0

## 2024-09-22 NOTE — Progress Notes (Addendum)
 DISCHARGE NOTE HOME [name] to be discharged Home per MD order. Discussed prescriptions and follow up appointments with the patient. Prescriptions given to patient; medication list explained in detail. Patient verbalized understanding.  Skin clean, dry and intact without evidence of skin break down, no evidence of skin tears noted. IV catheter discontinued intact. Site without signs and symptoms of complications. Dressing and pressure applied. Pt denies pain at the site currently. No complaints noted.  Discharging with HD cath as noted on the [facility] Patient free of other  lines, drains, and wounds.   An After Visit Summary (AVS) was printed and given to the patient. Patient escorted via wheelchair, and discharged home via private auto.  [name], RN

## 2024-09-22 NOTE — Progress Notes (Signed)
 Inpatient dialysis canceled today, and outpatient dialysis session is  scheduled for tomorrow.

## 2024-09-22 NOTE — Evaluation (Signed)
 Physical Therapy Evaluation  Patient Details Name: [name] MRN: [id number] DOB: Sep 23, 1946 Today's Date: 09/22/2024  History of Present Illness  Pt is a 78 y/o female who presents 09/17/2024 from MD office with abdominal pain, N/V x3 days. Found to have worsening renal failure. PMH significant for HTN, urothelial CA s/p R nephro ureterectomy 2024, dCHF, PE, OSA, baseline CKD IV.   Clinical Impression  Pt admitted with above diagnosis. Pt currently with functional limitations due to the deficits listed below (see PT Problem List). At the time of PT eval pt was able to perform transfers and ambulation with gross supervision for safety to modified independence and no AD. Pt moving slow and guarded but without overt unsteadiness or LOB. Anticipate she will progress well. Pt will benefit from acute skilled PT to increase their independence and safety with mobility to allow discharge.           If plan is discharge home, recommend the following: A little help with walking and/or transfers;A little help with bathing/dressing/bathroom;Assistance with cooking/housework;Assist for transportation;Help with stairs or ramp for entrance   Can travel by private vehicle        Equipment Recommendations None recommended by PT  Recommendations for Other Services       Functional Status Assessment Patient has had a recent decline in their functional status and demonstrates the ability to make significant improvements in function in a reasonable and predictable amount of time.     Precautions / Restrictions Precautions Precautions: None Recall of Precautions/Restrictions: Intact Restrictions Weight Bearing Restrictions Per Provider Order: No      Mobility  Bed Mobility Overal bed mobility: Modified Independent             General bed mobility comments: HOB slightly elevated and no use of rails. No assist required to transition to EOB.    Transfers Overall transfer level: Needs  assistance Equipment used: None Transfers: Sit to/from Stand Sit to Stand: Supervision           General transfer comment: Close supervision for safety as pt powered up to full stand.    Ambulation/Gait Ambulation/Gait assistance: Supervision Gait Distance (Feet): 200 Feet Assistive device: None Gait Pattern/deviations: Step-through pattern, Decreased stride length, Trunk flexed Gait velocity: Decreased Gait velocity interpretation: <1.31 ft/sec, indicative of household ambulator   General Gait Details: Pt ambulating well without overt LOB. Slow and guarded initially however able to improve speed and quality of gait by end of session.  Stairs Stairs: Yes Stairs assistance: Contact guard assist Stair Management: One rail Right, Forwards, Alternating pattern Number of Stairs: 5 General stair comments: VC's for sequencing and general safety.  Wheelchair Mobility     Tilt Bed    Modified Rankin (Stroke Patients Only)       Balance Overall balance assessment: Mild deficits observed, not formally tested                                           Pertinent Vitals/Pain Pain Assessment Pain Assessment: No/denies pain    Home Living Family/patient expects to be discharged to:: Private residence Living Arrangements: Spouse/significant other Available Help at Discharge: Family;Available PRN/intermittently Type of Home: House Home Access: Stairs to enter Entrance Stairs-Rails: Right Entrance Stairs-Number of Steps: 5   Home Layout: One level Home Equipment: Agricultural Consultant (2 wheels);Cane - single point;Shower seat - built in  Prior Function Prior Level of Function : Independent/Modified Independent;Driving             Mobility Comments: Reports has not been feeling well for 3-4 weeks and since then has been more sedentary, sleeping a lot. ADLs Comments: Ind with ADLs, IADLs, home mgt, grocery shops     Extremity/Trunk Assessment    Upper Extremity Assessment Upper Extremity Assessment: Overall WFL for tasks assessed    Lower Extremity Assessment Lower Extremity Assessment: Overall WFL for tasks assessed (Grossly 5/5 strength in quads, hip flexors, hamstrings, ankle DF)    Cervical / Trunk Assessment Cervical / Trunk Assessment: Normal  Communication   Communication Communication: No apparent difficulties    Cognition Arousal: Alert Behavior During Therapy: WFL for tasks assessed/performed   PT - Cognitive impairments: No apparent impairments                         Following commands: Intact       Cueing Cueing Techniques: Verbal cues, Gestural cues     General Comments      Exercises     Assessment/Plan    PT Assessment Patient needs continued PT services  PT Problem List Decreased strength;Decreased activity tolerance;Decreased balance;Decreased mobility;Decreased knowledge of use of DME;Decreased safety awareness;Decreased knowledge of precautions;Pain       PT Treatment Interventions DME instruction;Gait training;Functional mobility training;Stair training;Therapeutic activities;Therapeutic exercise;Balance training;Patient/family education    PT Goals (Current goals can be found in the Care Plan section)  Acute Rehab PT Goals Patient Stated Goal: Home at d/c - hopefully soon PT Goal Formulation: With patient Time For Goal Achievement: 09/29/24 Potential to Achieve Goals: Good    Frequency Min 2X/week     Co-evaluation               AM-PAC PT 6 Clicks Mobility  Outcome Measure Help needed turning from your back to your side while in a flat bed without using bedrails?: None Help needed moving from lying on your back to sitting on the side of a flat bed without using bedrails?: None Help needed moving to and from a bed to a chair (including a wheelchair)?: A Little Help needed standing up from a chair using your arms (e.g., wheelchair or bedside chair)?: A  Little Help needed to walk in hospital room?: A Little Help needed climbing 3-5 steps with a railing? : A Little 6 Click Score: 20    End of Session Equipment Utilized During Treatment: Gait belt Activity Tolerance: Patient tolerated treatment well Patient left: in chair;with call bell/phone within reach;with chair alarm set Nurse Communication: Mobility status PT Visit Diagnosis: Unsteadiness on feet (R26.81);Difficulty in walking, not elsewhere classified (R26.2)    Time: 8881-8859 PT Time Calculation (min) (ACUTE ONLY): 22 min   Charges:   PT Evaluation $PT Eval Low Complexity: 1 Low   PT General Charges $$ ACUTE PT VISIT: 1 Visit         Leita Sable, PT, DPT Acute Rehabilitation Services Secure Chat Preferred Office: [phone]   Leita JONETTA Sable 09/22/2024, 1:41 PM

## 2024-09-22 NOTE — Consult Note (Addendum)
 Hospital Consult    Reason for Consult:  dialysis access Requesting Physician:  Dennise MRN #:  [id number]  History of Present Illness: This is a 78 y.o. female who presented to the hospital with CKD IV with elevated creatinine of 7.21.  she had a 3 week hx of N/V and poor po intake as well as abdominal pain.   Vascular has been consulted for permanent access.  IR placed TDC on 09/18/2024.   She has PMH significant for right nephrectomy for carcinoma.  She now has AKI on CKD.   The pt is on a statin for cholesterol management.  The pt is on a daily aspirin .   Other AC:  sq heparin   The pt is on BB for hypertension.   The pt is not on medication for diabetes PTA. Tobacco hx:  former  Past Medical History:  Diagnosis Date   Anemia associated with chronic renal failure    Arthritis    Blood dyscrasia 2008   hx of PE   CAD in native artery 10/2019   cardiologist-   dr christella. santo;   a. 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   urologist--- dr dahlstedt/  oncologist--- dr amadeo;   dx 09/ 2020 high grade urothelial  s/p laser ablation, ;  recurrent s/p BCG instillation,  completed chemo instilation 11/ 2022  and repeat chemo completed 05/ 2023   Chronic combined systolic and diastolic CHF (congestive heart failure) (HCC) 06/2019   followed by cardiology;   dx 09/ 2022 in setting of sepsis, pulm edema;   05/ 2022  ef 40-45% per echo,  recovered per cath 01/ 2021 ef 50%;  laste echo 08/ 2023  ef 60-65%   CKD (chronic kidney disease), stage III (HCC)    Depression    Dysrhythmia    bradycardia   GAD (generalized anxiety disorder)    GERD (gastroesophageal reflux disease)    History of bladder cancer 12/2006   followed by dr matilda   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   dx 04/ 2008  w/ poor function hydronephrotic  right kidney;   02-06-2007  s/p right nephroureterectomy    History of kidney stones    History of pulmonary embolism 04/2022   in setting severe sepsis;  small RLL, treated w/ 3 months eliquis    Hyperlipidemia, mixed    Hypertension    Solitary kidney, acquired 02/06/2007   s/p  right nephroureterectomy for cancer    Past Surgical History:  Procedure Laterality Date   CATARACT EXTRACTION W/ INTRAOCULAR LENS IMPLANT Bilateral 2011   CYSTOSCOPY W/ RETROGRADES Left 01/30/2024   Procedure: CYSTOSCOPY, WITH RETROGRADE PYELOGRAM;  Surgeon: Alvaro Ricardo KATHEE Mickey., MD;  Location: [facility];  Service: Urology;  Laterality: Left;   CYSTOSCOPY W/ RETROGRADES Left 08/14/2024   Procedure: CYSTOSCOPY, WITH RETROGRADE PYELOGRAM;  Surgeon: Alvaro Ricardo KATHEE Mickey., MD;  Location: [facility];  Service: Urology;  Laterality: Left;   CYSTOSCOPY W/ URETERAL STENT PLACEMENT Left 11/17/2019   Procedure: CYSTOSCOPY WITH STENT REPLACEMENT;  Surgeon: Matilda Senior, MD;  Location: [facility];  Service: Urology;  Laterality: Left;   CYSTOSCOPY W/ URETERAL STENT PLACEMENT Left 03/23/2022   Procedure: CYSTOSCOPY WITH RETROGRADE PYELOGRAM, LEFT URETERAL STENT PLACEMENT;  Surgeon: Matilda Senior, MD;  Location: [facility];  Service: Urology;  Laterality: Left;   CYSTOSCOPY W/ URETERAL STENT PLACEMENT Left 06/09/2022  Procedure: CYSTOSCOPY WITH LEFT STENT EXCHANGE;  Surgeon: Matilda Senior, MD;  Location: [facility];  Service: Urology;  Laterality: Left;   CYSTOSCOPY W/ URETERAL STENT PLACEMENT Left 12/04/2022   Procedure: CYSTOSCOPY WITH STENT REPLACEMENT;  Surgeon: Matilda Senior, MD;  Location: [facility];  Service: Urology;  Laterality: Left;   CYSTOSCOPY W/ URETERAL STENT REMOVAL Left 05/10/2020   Procedure: CYSTOSCOPY WITH STENT REMOVAL;  Surgeon: Matilda Senior, MD;  Location: [facility];  Service: Urology;  Laterality: Left;   CYSTOSCOPY W/ URETERAL STENT REMOVAL Left 05/02/2021   Procedure:  CYSTOSCOPY WITH STENT REMOVAL;  Surgeon: Matilda Senior, MD;  Location: [facility];  Service: Urology;  Laterality: Left;   CYSTOSCOPY W/ URETERAL STENT REMOVAL Left 11/16/2022   Procedure: CYSTOSCOPY WITH STENT REMOVAL;  Surgeon: Matilda Senior, MD;  Location: [facility];  Service: Urology;  Laterality: Left;   CYSTOSCOPY WITH RETROGRADE PYELOGRAM, URETEROSCOPY AND STENT PLACEMENT Left 06/27/2019   Procedure: CYSTOSCOPY WITH LEFT RETROGRADE PYELOGRAM, URETEROSCOPY, BIOPSY AND LEFT STENT PLACEMENT;  Surgeon: Watt Rush, MD;  Location: [facility];  Service: Urology;  Laterality: Left;   CYSTOSCOPY WITH RETROGRADE PYELOGRAM, URETEROSCOPY AND STENT PLACEMENT Left 08/18/2019   Procedure: CYSTOSCOPY, URETEROSCOPY AND STENT EXCHANGE;  Surgeon: Matilda Senior, MD;  Location: [facility];  Service: Urology;  Laterality: Left;  90 MINS   CYSTOSCOPY WITH RETROGRADE PYELOGRAM, URETEROSCOPY AND STENT PLACEMENT Left 11/17/2019   Procedure: CYSTOSCOPY WITH RETROGRADE PYELOGRAM, URETEROSCOPY AND STENT PLACEMENT;  Surgeon: Matilda Senior, MD;  Location: [facility];  Service: Urology;  Laterality: Left;  90 MINS   CYSTOSCOPY WITH RETROGRADE PYELOGRAM, URETEROSCOPY AND STENT PLACEMENT Left 05/10/2020   Procedure: CYSTOSCOPY WITH RETROGRADE PYELOGRAM, URETEROSCOPY AND STENT PLACEMENT WITH URETHRAL DIALATION AND BRUSH BIOPSY;  Surgeon: Matilda Senior, MD;  Location: [facility];  Service: Urology;  Laterality: Left;  1 HR   CYSTOSCOPY WITH RETROGRADE PYELOGRAM, URETEROSCOPY AND STENT PLACEMENT Left 09/16/2020   Procedure: CYSTOSCOPY WITH RETROGRADE PYELOGRAM, URETEROSCOPY ,  LITHOPAXY, LEFT URETERAL BRUSHING, AND STENT REPLACEMENT;  Surgeon: Matilda Senior, MD;  Location: [facility];  Service: Urology;  Laterality: Left;   CYSTOSCOPY WITH RETROGRADE PYELOGRAM, URETEROSCOPY AND STENT PLACEMENT Left 10/24/2021   Procedure:  CYSTOSCOPY WITH RETROGRADE PYELOGRAM, URETEROSCOPY, POSSIBLE URETERAL AND RENAL BIOPSIES AND STENT PLACEMENT;  Surgeon: Matilda Senior, MD;  Location: [facility];  Service: Urology;  Laterality: Left;  1 HR   CYSTOSCOPY WITH RETROGRADE PYELOGRAM, URETEROSCOPY AND STENT PLACEMENT Left 03/09/2022   Procedure: CYSTOSCOPY WITH ANTEGRADE PYELOGRAM,  STENT REMOVAL;  Surgeon: Matilda Senior, MD;  Location: [facility];  Service: Urology;  Laterality: Left;   CYSTOSCOPY WITH RETROGRADE PYELOGRAM, URETEROSCOPY AND STENT PLACEMENT  01/09/2007   @[facility]  by dr matilda;    bx's/ washing  bladder , right ureter   CYSTOSCOPY WITH RETROGRADE PYELOGRAM, URETEROSCOPY AND STENT PLACEMENT Left 11/16/2022   Procedure: CYSTOSCOPY WITH RETROGRADE PYELOGRAM, URETEROSCOPY AND STENT REPLACEMENT;  Surgeon: Matilda Senior, MD;  Location: [facility];  Service: Urology;  Laterality: Left;  90 MINS   CYSTOSCOPY WITH RETROGRADE PYELOGRAM, URETEROSCOPY AND STENT PLACEMENT Left 07/06/2023   Procedure: CYSTOSCOPY WITH LEFT RETROGRADE PYELOGRAM, URETEROSCOPY, LASER ABLATION OF TUMOR AND STENT EXCHANGE;  Surgeon: Alvaro Ricardo KATHEE Mickey., MD;  Location: [facility];  Service: Urology;  Laterality: Left;  60 MINUTES NEEDED FOR CASE   CYSTOSCOPY/URETEROSCOPY/HOLMIUM LASER Left 12/04/2022   Procedure: CYSTOSCOPY/URETEROSCOPY/  HOLMIUM LASER OF RENAL PELVIC LESIONS;  Surgeon:  Matilda Senior, MD;  Location: [facility];  Service: Urology;  Laterality: Left;   CYSTOSCOPY/URETEROSCOPY/HOLMIUM LASER/STENT PLACEMENT Left 05/02/2021   Procedure: CYSTOSCOPY/URETEROSCOPY WITH BRUSH BIOPSY/ RETROGRADE PYELOGRAM/ HOLMIUM LASER/STENT REPLACEMENT;  Surgeon: Matilda Senior, MD;  Location: [facility];  Service: Urology;  Laterality: Left;   CYSTOSCOPY/URETEROSCOPY/HOLMIUM LASER/STENT PLACEMENT Left 01/30/2024   Procedure: CYSTOSCOPY/DIAGNOSTIC URETEROSCOPY/ RETROGRADE/STENT  EXCHANGE;  Surgeon: Alvaro Ricardo KATHEE Mickey., MD;  Location: [facility];  Service: Urology;  Laterality: Left;   CYSTOSCOPY/URETEROSCOPY/HOLMIUM LASER/STENT PLACEMENT Left 08/14/2024   Procedure: CYSTOSCOPY/DIAGNOSTIC URETEROSCOPY/STENT EXCHANGE;  Surgeon: Alvaro Ricardo KATHEE Mickey., MD;  Location: [facility];  Service: Urology;  Laterality: Left;   HOLMIUM LASER APPLICATION Left 09/16/2020   Procedure: HOLMIUM LASER APPLICATION;  Surgeon: Matilda Senior, MD;  Location: [facility];  Service: Urology;  Laterality: Left;   HOLMIUM LASER APPLICATION Left 10/24/2021   Procedure: HOLMIUM LASER APPLICATION OF TUMORS;  Surgeon: Matilda Senior, MD;  Location: [facility];  Service: Urology;  Laterality: Left;   HOLMIUM LASER APPLICATION Left 11/16/2022   Procedure: HOLMIUM LASER OF TUMORS;  Surgeon: Matilda Senior, MD;  Location: [facility];  Service: Urology;  Laterality: Left;   IR NEPHROSTOMY PLACEMENT LEFT  07/06/2021   IR NEPHROSTOMY PLACEMENT LEFT  02/06/2022   IR TUNNELED CENTRAL VENOUS CATH PLC W IMG  09/18/2024   NEPHROSTOMY TUBE REMOVAL Left 03/23/2022   Procedure: NEPHROSTOMY TUBE REMOVAL;  Surgeon: Matilda Senior, MD;  Location: [facility];  Service: Urology;  Laterality: Left;   NEPHROURETERECTOMY Right 02/06/2007   @[facility]  by dr matilda;   Laparoscopic   RIGHT/LEFT HEART CATH AND CORONARY ANGIOGRAPHY N/A 11/06/2019   Procedure: RIGHT/LEFT HEART CATH AND CORONARY ANGIOGRAPHY;  Surgeon: Claudene Victory ORN, MD;  Location: [facility];  Service: Cardiovascular;  Laterality: N/A;   THULIUM LASER TURP (TRANSURETHRAL RESECTION OF PROSTATE) Left 08/18/2019   Procedure: THULIUM LASER ABLATION OF URETERAL TUMOR;  Surgeon: Matilda Senior, MD;  Location: [facility];  Service: Urology;  Laterality: Left;   THULIUM LASER TURP (TRANSURETHRAL RESECTION OF PROSTATE) Left 11/17/2019   Procedure: THULIUM LASER of URETERAL CANCER;  Surgeon:  Matilda Senior, MD;  Location: [facility];  Service: Urology;  Laterality: Left;   TRANSURETHRAL RESECTION OF BLADDER TUMOR  12/12/2006   @[facility]  by dr andra   VAGINAL HYSTERECTOMY  1980    Allergies[1]  Prior to Admission medications  Medication Sig Start Date End Date Taking? Authorizing Provider  acetaminophen  (TYLENOL ) 500 MG tablet Take 500-1,000 mg by mouth every 6 (six) hours as needed for mild pain.   Yes [provider]  aspirin  EC 81 MG tablet Take 1 tablet (81 mg total) by mouth daily. Swallow whole. 01/09/23  Yes Parthenia Olivia HERO, PA-C  Calcium  Carbonate Antacid (TUMS PO) Take 1 tablet by mouth daily as needed (heartburn).   Yes [provider]  carvedilol  (COREG ) 6.25 MG tablet TAKE 1 TABLET(6.25 MG) BY MOUTH TWICE DAILY WITH A MEAL 02/04/24  Yes Chandrasekhar, Mahesh A, MD  cholecalciferol  (VITAMIN D3) 25 MCG (1000 UNIT) tablet Take 1,000 Units by mouth every morning.   Yes [provider]  famotidine  (PEPCID ) 10 MG tablet Take 1 tablet by mouth daily.   Yes [provider]  ferrous sulfate  325 (65 FE) MG tablet Take 325 mg by mouth daily with breakfast.   Yes [provider]  icosapent  Ethyl (VASCEPA ) 1 g capsule Take 1 capsule (1 g total) by mouth 2 (  two) times daily. 04/09/24  Yes Chandrasekhar, Mahesh A, MD  isosorbide  mononitrate (IMDUR ) 30 MG 24 hr tablet TAKE 1/2 TABLET(15 MG) BY MOUTH DAILY 03/06/24  Yes Chandrasekhar, Mahesh A, MD  rosuvastatin  (CRESTOR ) 40 MG tablet TAKE 1 TABLET(40 MG) BY MOUTH DAILY 04/29/24  Yes Chandrasekhar, Mahesh A, MD  sennosides-docusate sodium  (SENOKOT-S) 8.6-50 MG tablet Take 1 tablet by mouth daily.   Yes [provider]  sertraline  (ZOLOFT ) 50 MG tablet Take 50 mg by mouth every morning.   Yes [provider]  traMADol  (ULTRAM ) 50 MG tablet Take 1-2 tablets (50-100 mg total) by mouth every 6 (six) hours as needed. For post-op pain. 08/14/24 08/14/25 Yes Manny,  Ricardo KATHEE Raddle., MD  traZODone  (DESYREL ) 150 MG tablet Take 150 mg by mouth at bedtime. 08/24/20  Yes [provider]    Social History   Socioeconomic History   Marital status: Married    Spouse name: Not on file   Number of children: Not on file   Years of education: Not on file   Highest education level: Not on file  Occupational History   Not on file  Tobacco Use   Smoking status: Former    Current packs/day: 0.00    Average packs/day: 0.5 packs/day for 38.0 years (19.0 ttl pk-yrs)    Types: Cigarettes    Start date: 11/25/1965    Quit date: 11/26/2003    Years since quitting: 20.8   Smokeless tobacco: Never  Vaping Use   Vaping status: Never Used  Substance and Sexual Activity   Alcohol use: Not Currently   Drug use: Never   Sexual activity: Yes    Birth control/protection: Surgical, Post-menopausal  Other Topics Concern   Not on file  Social History Narrative   Not on file   Social Drivers of Health   Tobacco Use: Medium Risk (09/18/2024)   Patient History    Smoking Tobacco Use: Former    Smokeless Tobacco Use: Never    Passive Exposure: Not on Actuary Strain: Not on file  Food Insecurity: No Food Insecurity (09/18/2024)   Epic    Worried About Programme Researcher, Broadcasting/film/video in the Last Year: Never true    Ran Out of Food in the Last Year: Never true  Transportation Needs: No Transportation Needs (09/18/2024)   Epic    Lack of Transportation (Medical): No    Lack of Transportation (Non-Medical): No  Physical Activity: Not on file  Stress: Not on file  Social Connections: Patient Declined (09/18/2024)   Social Connection and Isolation Panel    Frequency of Communication with Friends and Family: Patient declined    Frequency of Social Gatherings with Friends and Family: Patient declined    Attends Religious Services: Patient declined    Database Administrator or Organizations: Patient declined    Attends Banker Meetings:  Patient declined    Marital Status: Patient declined  Intimate Partner Violence: Not At Risk (09/18/2024)   Epic    Fear of Current or Ex-Partner: No    Emotionally Abused: No    Physically Abused: No    Sexually Abused: No  Depression (PHQ2-9): Not on file  Alcohol Screen: Not on file  Housing: Low Risk (09/18/2024)   Epic    Unable to Pay for Housing in the Last Year: No    Number of Times Moved in the Last Year: 0    Homeless in the Last Year: No  Utilities: Not At Risk (  09/18/2024)   Epic    Threatened with loss of utilities: No  Health Literacy: Not on file     Family History  Problem Relation Age of Onset   Hypertension Mother    Diabetes Mellitus II Sister    Hypertension Sister    Hypertension Brother    BRCA 1/2 Neg Hx    Breast cancer Neg Hx     ROS: [x]  Positive   [ ]  Negative   [ ]  All sytems reviewed and are negative  Cardiac: [x]  hx CHF   Vascular: []  pain in legs while walking []  pain in legs at rest []  pain in legs at night []  non-healing ulcers []  hx of DVT []  swelling in legs  Pulmonary: []  asthma/wheezing []  home O2  Neurologic: []  hx of CVA []  mini stroke   Hematologic: [x]  hx of cancer  Endocrine:   []  diabetes []  thyroid  disease  GI [x]  GERD  GU: [x]  CKD/renal failure [x]  HD--[]  M/W/F or []  T/T/S [x]  solitary kidney hx of nephrectomy  Psychiatric: [x]  anxiety  Musculoskeletal: []  arthritis []  joint pain  Integumentary: []  rashes []  ulcers  Constitutional: []  fever  []  chills  Physical Examination  Vitals:   09/22/24 0604 09/22/24 0729  BP: (!) 145/76 (!) 145/83  Pulse: 73 88  Resp: 18 19  Temp: 97.9 F (36.6 C) 98.2 F (36.8 C)  SpO2: 96% 97%   Body mass index is 29.21 kg/m.  General:  WDWN in NAD Gait: Not observed HENT: WNL, normocephalic Pulmonary: normal non-labored breathing Cardiac: regular Skin: without rashes Vascular Exam/Pulses: 2+ bilateral radial, ulnar and brachial pulses   Extremities: IV in left antecubital space Musculoskeletal: no muscle wasting or atrophy  Neurologic: A&O X 3 Psychiatric:  The pt has Normal affect.   CBC    Component Value Date/Time   WBC 9.0 09/22/2024 0414   RBC 2.83 (L) 09/22/2024 0414   HGB 8.2 (L) 09/22/2024 0414   HGB 9.0 (L) 08/27/2024 1101   HGB 13.1 11/04/2019 1058   HCT 26.0 (L) 09/22/2024 0414   HCT 39.5 11/04/2019 1058   PLT 183 09/22/2024 0414   PLT 237 08/27/2024 1101   PLT 280 11/04/2019 1058   MCV 91.9 09/22/2024 0414   MCV 91 11/04/2019 1058   MCH 29.0 09/22/2024 0414   MCHC 31.5 09/22/2024 0414   RDW 15.2 09/22/2024 0414   RDW 13.1 11/04/2019 1058   LYMPHSABS 1.8 09/17/2024 1422   MONOABS 0.9 09/17/2024 1422   EOSABS 0.2 09/17/2024 1422   BASOSABS 0.1 09/17/2024 1422    BMET    Component Value Date/Time   NA 137 09/22/2024 0414   NA 141 08/10/2021 0835   K 3.9 09/22/2024 0414   CL 101 09/22/2024 0414   CO2 28 09/22/2024 0414   GLUCOSE 84 09/22/2024 0414   BUN 32 (H) 09/22/2024 0414   BUN 15 08/10/2021 0835   CREATININE 4.78 (H) 09/22/2024 0414   CREATININE 6.30 (H) 08/27/2024 1101   CALCIUM  8.3 (L) 09/22/2024 0414   GFRNONAA 9 (L) 09/22/2024 0414   GFRNONAA 6 (L) 08/27/2024 1101   GFRAA 47 (L) 11/04/2019 1058    COAGS: Lab Results  Component Value Date   INR 1.3 (H) 08/02/2023   INR 1.6 (H) 05/30/2022   INR 1.2 04/21/2022     Non-Invasive Vascular Imaging:   BUE vein mapping 09/21/2024 +-----------------+-------------+----------+---------+  Right Cephalic   Diameter (cm)Depth (cm)Findings   +-----------------+-------------+----------+---------+  Shoulder  0.22        1.31              +-----------------+-------------+----------+---------+  Prox upper arm       0.28        1.40              +-----------------+-------------+----------+---------+  Mid upper arm        0.27        0.85               +-----------------+-------------+----------+---------+  Dist upper arm       0.24        0.55              +-----------------+-------------+----------+---------+  Antecubital fossa    0.29        0.40              +-----------------+-------------+----------+---------+  Prox forearm         0.21        0.25              +-----------------+-------------+----------+---------+  Mid forearm          0.22        0.28              +-----------------+-------------+----------+---------+  Dist forearm         0.28        0.90   branching  +-----------------+-------------+----------+---------+  Wrist               0.18        0.13              +-----------------+-------------+----------+---------+   +-----------------+-------------+----------+--------------+  Right Basilic    Diameter (cm)Depth (cm)   Findings     +-----------------+-------------+----------+--------------+  Mid upper arm        0.35                               +-----------------+-------------+----------+--------------+  Dist upper arm       0.35                               +-----------------+-------------+----------+--------------+  Antecubital fossa    0.20                               +-----------------+-------------+----------+--------------+  Prox forearm         0.23                               +-----------------+-------------+----------+--------------+  Mid forearm          0.19                 branching     +-----------------+-------------+----------+--------------+  Distal forearm       0.15                               +-----------------+-------------+----------+--------------+  Elbow               0.25                 branching     +-----------------+-------------+----------+--------------+  Wrist  not visualized  +-----------------+-------------+----------+--------------+    +-----------------+-------------+----------+--------------+  Left Cephalic    Diameter (cm)Depth (cm)   Findings     +-----------------+-------------+----------+--------------+  Shoulder            0.29        1.46                   +-----------------+-------------+----------+--------------+  Prox upper arm       0.32        1.00                   +-----------------+-------------+----------+--------------+  Mid upper arm        0.29        0.38                   +-----------------+-------------+----------+--------------+  Dist upper arm       0.29        0.24                   +-----------------+-------------+----------+--------------+  Antecubital fossa    0.45        0.15      IV line      +-----------------+-------------+----------+--------------+  Prox forearm         0.20        0.40                   +-----------------+-------------+----------+--------------+  Mid forearm          0.14        0.54                   +-----------------+-------------+----------+--------------+  Dist forearm         0.13        0.11                   +-----------------+-------------+----------+--------------+  Wrist                                  not visualized  +-----------------+-------------+----------+--------------+   +-----------------+-------------+----------+---------+  Left Basilic     Diameter (cm)Depth (cm)Findings   +-----------------+-------------+----------+---------+  Mid upper arm        0.36                          +-----------------+-------------+----------+---------+  Dist upper arm       0.38                          +-----------------+-------------+----------+---------+  Antecubital fossa    0.21               branching  +-----------------+-------------+----------+---------+  Prox forearm         0.26                          +-----------------+-------------+----------+---------+  Mid forearm           0.18                          +-----------------+-------------+----------+---------+  Distal forearm       0.13               branching  +-----------------+-------------+----------+---------+  Elbow  0.31                          +-----------------+-------------+----------+---------+  Wrist               0.15                          +-----------------+-------------+----------+---------+     ASSESSMENT/PLAN: This is a 78 y.o. female with AKI on CKD now in need of dialysis access.  She had TDC placed 09/18/2024 by IR.    -pt is right hand dominant.  She has palpable bilateral radial pulses.   -pt has left cephalic vein that looks suitable for fistula.   -plan for left AV fistula tomorrow.  Discussed with pt that it would need 3 months to mature and may need additional procedures to get it to function.  She expressed good understanding.  -Dr. Gretta to evaluate pt and determine further plan -npo after MN/consent/labs/abx ordered   Lucie Apt, PA-C [facility] [phone]   I have seen and evaluated the patient. I agree with the PA note as documented above.  78 year old female with AKI on CKD stage IV with now progression to ESRD that vascular surgery has been consulted for permanent dialysis access.  This is felt to be related to nausea vomiting and volume depletion with poor p.o. intake and a solitary kidney.   Patient has a TDC. She is right-handed.  Discussed plan for access placement in the nondominant arm which would be her left arm.  Will add on tomorrow with Dr. Sheree in the OR.  N.p.o. after midnight.  Consent order placed.  Does look like she has usable cephalic vein in the left arm which we will restrict.  Risks benefits discussed including failure to mature and steal syndrome.  Lonni DOROTHA Gretta, MD [facility] [place] Office: [phone]     [1]  Allergies Allergen Reactions    Erythromycin Diarrhea and Nausea And Vomiting   Macrodantin [Nitrofurantoin] Diarrhea    And night sweats   Lotensin [Benazepril Hcl] Cough

## 2024-09-22 NOTE — Progress Notes (Addendum)
 Reached out to clinic [facility] admissions at this time. FA not answering. Have requested to escalate and receive schedule. Will continue to update as possible.   [name] Dialysis navigator [phone]  Addendum 1:06 pm Pt has been approved a TTS schedule at [facility] kidney clinic. Chair time 1215 pm. Tentative start date 12/16 pending d/c. Pt will need to arrive 11:15 am for first appointment. Met bedside and discussed this with pt, schedule letter provided, pt agreeable although she did not get the schedule she preferred, she understands she can request with clinic to be put on wait list to change to MWF once this opens up. Care team including nephrology, attending, CM/SW, and RN informed at this time. AVS updated, will continue to assist as needed.   Addendum 3:39pm D/c order noted. Contacted [facility] to inform that pt will be starting tomorrow at clinic, have requested PA to pls send orders, no further support needed.

## 2024-09-22 NOTE — Progress Notes (Signed)
 [facility] Progress Note   Assessment/ Plan:    AKI/CKD stage IV--> progression to ESRD - likely due to volume depletion given 3 weeks of N/V and poor po intake vs progressive CKD to stage V in pt with solitary functioning kidney.  Failed supportive care, now on HD  - TDC placed 09/18/24  - HD #1 12/11, HD 12/13  - will need CLIP- in process  - will need perm access too-- s/p vein mapping. Consulted [facility]VS  - eGFR < 10 since October  - HD #3 today 12/15. MWF thereafter Refractory N/V - Iikely related to uremia--resolved Leukocytosis - possible recurrent UTI given stranding of periureteral, peri-collecting system, and perirenal on CT scan.  - improved today  - s/p CTX Left ureteral carcinoma - s/p stent exchange on 08/14/24 without evidence of hydronephrosis. AGMA - likely due to #1.  resolved Normocytic anemia - likely due to CKD stage IV.  Hold off on ESA for now given malignancy.  Followed by Oncology.  Transfuse prn Hgb <7.  Continue to follow for now.  HTN - stable  Discussed with primary service.    Subjective:    Seen and examined. No complaints, working with PT currently. She reports that her N/V has resolved   Objective:   BP (!) 145/83 (BP Location: Right Arm)   Pulse 88   Temp 98.2 F (36.8 C)   Resp 19   Ht 5' 3 (1.6 m)   Wt 74.8 kg   SpO2 97%   BMI 29.21 kg/m   Intake/Output Summary (Last 24 hours) at 09/22/2024 1037 Last data filed at 09/22/2024 0900 Gross per 24 hour  Intake 860 ml  Output 100 ml  Net 760 ml   Weight change:   Physical Exam: Gen:NAD CVS RRR Resp clear, unlabored/normal wob Jai:dnqu Ext no LE edema Dialysis access: right [facility]  Imaging: VAS US  UPPER EXT VEIN MAPPING (PRE-OP AVF) Result Date: 09/21/2024 UPPER EXTREMITY VEIN MAPPING Patient Name:  [name]  Date of Exam:   09/21/2024 Medical Rec #: [id number]          Accession #:    [id number] Date of Birth: 05-25-1946          Patient Gender: F Patient Age:    78 years Exam Location:  [facility] Procedure:      VAS US  UPPER EXT VEIN MAPPING (PRE-OP AVF) Referring Phys: ALMARIE UPTON --------------------------------------------------------------------------------  Indications: Pre-access. History: ESRD.  Comparison Study: No prior exam. Performing Technologist: Edilia Elden Appl  Examination Guidelines: A complete evaluation includes B-mode imaging, spectral Doppler, color Doppler, and power Doppler as needed of all accessible portions of each vessel. Bilateral testing is considered an integral part of a complete examination. Limited examinations for reoccurring indications may be performed as noted. +-----------------+-------------+----------+---------+ Right Cephalic   Diameter (cm)Depth (cm)Findings  +-----------------+-------------+----------+---------+ Shoulder             0.22        1.31             +-----------------+-------------+----------+---------+ Prox upper arm       0.28        1.40             +-----------------+-------------+----------+---------+ Mid upper arm        0.27        0.85             +-----------------+-------------+----------+---------+ Dist upper arm       0.24  0.55             +-----------------+-------------+----------+---------+ Antecubital fossa    0.29        0.40             +-----------------+-------------+----------+---------+ Prox forearm         0.21        0.25             +-----------------+-------------+----------+---------+ Mid forearm          0.22        0.28             +-----------------+-------------+----------+---------+ Dist forearm         0.28        0.90   branching +-----------------+-------------+----------+---------+ Wrist                0.18        0.13             +-----------------+-------------+----------+---------+ +-----------------+-------------+----------+--------------+ Right Basilic    Diameter (cm)Depth (cm)   Findings     +-----------------+-------------+----------+--------------+ Mid upper arm        0.35                              +-----------------+-------------+----------+--------------+ Dist upper arm       0.35                              +-----------------+-------------+----------+--------------+ Antecubital fossa    0.20                              +-----------------+-------------+----------+--------------+ Prox forearm         0.23                              +-----------------+-------------+----------+--------------+ Mid forearm          0.19                 branching    +-----------------+-------------+----------+--------------+ Distal forearm       0.15                              +-----------------+-------------+----------+--------------+ Elbow                0.25                 branching    +-----------------+-------------+----------+--------------+ Wrist                                   not visualized +-----------------+-------------+----------+--------------+ +-----------------+-------------+----------+--------------+ Left Cephalic    Diameter (cm)Depth (cm)   Findings    +-----------------+-------------+----------+--------------+ Shoulder             0.29        1.46                  +-----------------+-------------+----------+--------------+ Prox upper arm       0.32        1.00                  +-----------------+-------------+----------+--------------+ Mid upper arm        0.29  0.38                  +-----------------+-------------+----------+--------------+ Dist upper arm       0.29        0.24                  +-----------------+-------------+----------+--------------+ Antecubital fossa    0.45        0.15      IV line     +-----------------+-------------+----------+--------------+ Prox forearm         0.20        0.40                  +-----------------+-------------+----------+--------------+ Mid forearm          0.14         0.54                  +-----------------+-------------+----------+--------------+ Dist forearm         0.13        0.11                  +-----------------+-------------+----------+--------------+ Wrist                                   not visualized +-----------------+-------------+----------+--------------+ +-----------------+-------------+----------+---------+ Left Basilic     Diameter (cm)Depth (cm)Findings  +-----------------+-------------+----------+---------+ Mid upper arm        0.36                         +-----------------+-------------+----------+---------+ Dist upper arm       0.38                         +-----------------+-------------+----------+---------+ Antecubital fossa    0.21               branching +-----------------+-------------+----------+---------+ Prox forearm         0.26                         +-----------------+-------------+----------+---------+ Mid forearm          0.18                         +-----------------+-------------+----------+---------+ Distal forearm       0.13               branching +-----------------+-------------+----------+---------+ Elbow                0.31                         +-----------------+-------------+----------+---------+ Wrist                0.15                         +-----------------+-------------+----------+---------+ *See table(s) above for measurements and observations.  Diagnosing physician: Debby Robertson Electronically signed by Debby Robertson on 09/21/2024 at 8:16:22 PM.    Final      Labs: BMET Recent Labs  Lab 09/17/24 1422 09/18/24 0404 09/19/24 0458 09/20/24 0543 09/21/24 0610 09/22/24 0414  NA 135 132* 135 137 135 137  K 4.6 4.5 3.9 4.3 3.7 3.9  CL 106 104 101 104 98 101  CO2 17* 16* 23 23 27  28  GLUCOSE 125* 88 77 82 102* 84  BUN 105* 102* 52* 51* 25* 32*  CREATININE 10.06* 8.99* 5.63* 6.09* 4.00* 4.78*  CALCIUM  8.5* 8.3* 7.9* 8.1* 8.3* 8.3*  PHOS   --  6.2* 4.0 4.7* 3.3 3.8   CBC Recent Labs  Lab 09/17/24 1422 09/18/24 0404 09/20/24 0735 09/22/24 0414  WBC 15.3* 9.9 9.1 9.0  NEUTROABS 12.3*  --   --   --   HGB 8.3* 7.5* 8.4* 8.2*  HCT 25.9* 23.8* 26.6* 26.0*  MCV 91.5 91.2 92.7 91.9  PLT 266 239 139* 183    Medications:     carvedilol   3.125 mg Oral BID WC   Chlorhexidine  Gluconate Cloth  6 each Topical Q0600   heparin   5,000 Units Subcutaneous Q8H   senna-docusate  1 tablet Oral Daily   sertraline   25 mg Oral q morning   sevelamer  carbonate  800 mg Oral TID WC   traZODone   150 mg Oral QHS    Ephriam Stank, MD [facility] 09/22/2024, 10:37 AM

## 2024-09-22 NOTE — Care Management Important Message (Signed)
 Important Message  Patient Details  Name: [name] MRN: [id number] Date of Birth: July 14, 1946   Important Message Given:  Yes - Medicare IM     Claretta Deed 09/22/2024, 12:08 PM

## 2024-09-22 NOTE — Care Management Important Message (Signed)
 Important Message  Patient Details  Name: [name] MRN: [id number] Date of Birth: [birth year]   Important Message Given:  Yes - Medicare IM     Claretta Deed 09/22/2024, 4:33 PM

## 2024-09-22 NOTE — Discharge Summary (Signed)
 Physician Discharge Summary   Patient: [name] MRN: [id number] DOB: December 26, 1945  Admit date:     09/17/2024  Discharge date: 09/22/2024  Discharge Physician: Lonni SHAUNNA Dalton   PCP: Chrystal Lamarr RAMAN, MD     Recommendations at discharge:  Follow up with [facility] dialysis center for routine HD tomorrow at 11:15AM Follow up with PCP for new ESRD in 1 week Follow up with Vascular surgery Dr. Gretta for vascular access     Discharge Diagnoses: Principal Problem:   Acute renal failure on CKD IV Other hospital problems   Acute metabolic acidosis   Uremia   Hyponatremia   Nausea   Possible UTI   Chronic diastolic congestive heart failure   Hypertension   History of pulmonary embolism   Sleep apnea   History of bladder cancer   Anemia of chronic kidney disease     Hospital Course: 78 y.o. F with HTN, urothelial cancer status post right nephroureterectomy 2024, dCHF, history of PE, OSA not on CPAP, and baseline CKD 4 creatinine around 3 who presented to nephrologist office with nausea vomiting, found to have worsening renal failure.  Admitted, TDC placed, began dialysis.    Acute renal failure on CKD 4 Acidosis Uremia Hyponatremia Patient was admitted and started on dialysis.  Her acidosis and uremia resolved.  Her nausea improved.  Tunneled dialysis catheter was placed, and she was clipped to an outpatient dialysis center, first session tomorrow at noon.     Possible UTI Urine culture multiple species, completed 3 days of Rocephin .   Chronic diastolic congestive heart failure Hypertension History of PE Appears euvolemic.  Discharged back on home aspirin , carvedilol , Vascepa , Imdur , and Crestor    OSA Not on CPAP at home   History of bladder cancer   Anemia of chronic kidney disease Hemoglobin stable, no clinical bleeding.                 The [place]  Controlled Substances Registry was reviewed for this patient prior to  discharge.  Consultants: Nephrology Interventional radiology Vascular surgery  Procedures performed: Tunneled dialysis catheter placement Dialysis  Disposition: Home Diet recommendation:  Renal diet  DISCHARGE MEDICATION: Allergies as of 09/22/2024       Reactions   Erythromycin Diarrhea, Nausea And Vomiting   Macrodantin [nitrofurantoin] Diarrhea   And night sweats   Lotensin [benazepril Hcl] Cough        Medication List     TAKE these medications    acetaminophen  500 MG tablet Commonly known as: TYLENOL  Take 500-1,000 mg by mouth every 6 (six) hours as needed for mild pain.   aspirin  EC 81 MG tablet Take 1 tablet (81 mg total) by mouth daily. Swallow whole.   carvedilol  6.25 MG tablet Commonly known as: COREG  TAKE 1 TABLET(6.25 MG) BY MOUTH TWICE DAILY WITH A MEAL   cholecalciferol  25 MCG (1000 UNIT) tablet Commonly known as: VITAMIN D3 Take 1,000 Units by mouth every morning.   famotidine  10 MG tablet Commonly known as: PEPCID  Take 1 tablet by mouth daily.   ferrous sulfate  325 (65 FE) MG tablet Take 325 mg by mouth daily with breakfast.   icosapent  Ethyl 1 g capsule Commonly known as: Vascepa  Take 1 capsule (1 g total) by mouth 2 (two) times daily.   isosorbide  mononitrate 30 MG 24 hr tablet Commonly known as: IMDUR  TAKE 1/2 TABLET(15 MG) BY MOUTH DAILY   ondansetron  4 MG tablet Commonly known as: ZOFRAN  Take 1 tablet (4 mg total) by  mouth every 6 (six) hours as needed for nausea.   rosuvastatin  40 MG tablet Commonly known as: CRESTOR  TAKE 1 TABLET(40 MG) BY MOUTH DAILY   sennosides-docusate sodium  8.6-50 MG tablet Commonly known as: SENOKOT-S Take 1 tablet by mouth daily.   sertraline  50 MG tablet Commonly known as: ZOLOFT  Take 50 mg by mouth every morning.   sevelamer  carbonate 800 MG tablet Commonly known as: RENVELA  Take 1 tablet (800 mg total) by mouth 3 (three) times daily with meals.   traMADol  50 MG tablet Commonly known as:  Ultram  Take 1-2 tablets (50-100 mg total) by mouth every 6 (six) hours as needed. For post-op pain.   traZODone  150 MG tablet Commonly known as: DESYREL  Take 150 mg by mouth at bedtime.   TUMS PO Take 1 tablet by mouth daily as needed (heartburn).        Follow-up Information     Center, [facility]. Go on 09/23/2024.   Why: Please arrive at 11:15am for first appointment on 12/16. After this you will go every tues, thurs, and sat at 12:15.   Reminder: you can ask clinic on first day that you would like to be changed to a mon, wed, fri 1st shift once something opens. Contact information: [address] [place] [address] [phone]         Chrystal Lamarr RAMAN, MD. Schedule an appointment as soon as possible for a visit in 1 week(s).   Specialty: Family Medicine Contact information: [address] [place] [address] [phone]         Gretta Lonni PARAS, MD. Schedule an appointment as soon as possible for a visit in 1 week(s).   Specialty: Vascular Surgery Contact information: [address] [place] [address] [phone]                 Discharge Instructions     Discharge instructions   Complete by: As directed    **IMPORTANT DISCHARGE INSTRUCTIONS**   From Dr. Jonel: You were admitted for worsening renal failure Here, you had a tunneled catheter placed for dialysis and started dialysis  You should resume your normal home medicines  For nausea, take ondansetron /Zofran  4 mg Call your PCP primary care for an appointment within 1 week  For preventing excess phosphate from being absorbed, take Renvela  three times daily with meals  Dr. Gretta (the vascular surgeon) will have his office contact you to schedule surgery If you haven't heard from them in a week, cal the number listed below   Increase activity slowly   Complete by: As directed    No wound care   Complete by: As directed         Discharge Exam: Filed Weights   09/20/24 0804 09/20/24 1134 09/22/24 0828  Weight: 71.8 kg 71.8 kg 74.8 kg    General: Pt is alert, awake, not in acute distress Cardiovascular: RRR, nl S1-S2, no murmurs appreciated.   No LE edema.   Respiratory: Normal respiratory rate and rhythm.  CTAB without rales or wheezes. Abdominal: Abdomen soft and non-tender.  No distension or HSM.   Neuro/Psych: Strength symmetric in upper and lower extremities.  Judgment and insight appear normal.   Condition at discharge: good  The results of significant diagnostics from this hospitalization (including imaging, microbiology, ancillary and laboratory) are listed below for reference.   Imaging Studies: VAS US  UPPER EXT VEIN MAPPING (PRE-OP AVF) Result Date: 09/21/2024 UPPER EXTREMITY VEIN MAPPING Patient Name:  KIRAH STICE  Date of Exam:   09/21/2024 Medical Rec #: [id number]          Accession #:    [id number] Date of Birth: [birth year]          Patient Gender: F Patient Age:   30 years Exam Location:  [facility] Procedure:      VAS US  UPPER EXT VEIN MAPPING (PRE-OP AVF) Referring Phys: ALMARIE UPTON --------------------------------------------------------------------------------  Indications: Pre-access. History: ESRD.  Comparison Study: No prior exam. Performing Technologist: Edilia Elden Appl  Examination Guidelines: A complete evaluation includes B-mode imaging, spectral Doppler, color Doppler, and power Doppler as needed of all accessible portions of each vessel. Bilateral testing is considered an integral part of a complete examination. Limited examinations for reoccurring indications may be performed as noted. +-----------------+-------------+----------+---------+ Right Cephalic   Diameter (cm)Depth (cm)Findings  +-----------------+-------------+----------+---------+ Shoulder             0.22        1.31             +-----------------+-------------+----------+---------+ Prox  upper arm       0.28        1.40             +-----------------+-------------+----------+---------+ Mid upper arm        0.27        0.85             +-----------------+-------------+----------+---------+ Dist upper arm       0.24        0.55             +-----------------+-------------+----------+---------+ Antecubital fossa    0.29        0.40             +-----------------+-------------+----------+---------+ Prox forearm         0.21        0.25             +-----------------+-------------+----------+---------+ Mid forearm          0.22        0.28             +-----------------+-------------+----------+---------+ Dist forearm         0.28        0.90   branching +-----------------+-------------+----------+---------+ Wrist                0.18        0.13             +-----------------+-------------+----------+---------+ +-----------------+-------------+----------+--------------+ Right Basilic    Diameter (cm)Depth (cm)   Findings    +-----------------+-------------+----------+--------------+ Mid upper arm        0.35                              +-----------------+-------------+----------+--------------+ Dist upper arm       0.35                              +-----------------+-------------+----------+--------------+ Antecubital fossa    0.20                              +-----------------+-------------+----------+--------------+ Prox forearm         0.23                              +-----------------+-------------+----------+--------------+  Mid forearm          0.19                 branching    +-----------------+-------------+----------+--------------+ Distal forearm       0.15                              +-----------------+-------------+----------+--------------+ Elbow                0.25                 branching    +-----------------+-------------+----------+--------------+ Wrist                                   not  visualized +-----------------+-------------+----------+--------------+ +-----------------+-------------+----------+--------------+ Left Cephalic    Diameter (cm)Depth (cm)   Findings    +-----------------+-------------+----------+--------------+ Shoulder             0.29        1.46                  +-----------------+-------------+----------+--------------+ Prox upper arm       0.32        1.00                  +-----------------+-------------+----------+--------------+ Mid upper arm        0.29        0.38                  +-----------------+-------------+----------+--------------+ Dist upper arm       0.29        0.24                  +-----------------+-------------+----------+--------------+ Antecubital fossa    0.45        0.15      IV line     +-----------------+-------------+----------+--------------+ Prox forearm         0.20        0.40                  +-----------------+-------------+----------+--------------+ Mid forearm          0.14        0.54                  +-----------------+-------------+----------+--------------+ Dist forearm         0.13        0.11                  +-----------------+-------------+----------+--------------+ Wrist                                   not visualized +-----------------+-------------+----------+--------------+ +-----------------+-------------+----------+---------+ Left Basilic     Diameter (cm)Depth (cm)Findings  +-----------------+-------------+----------+---------+ Mid upper arm        0.36                         +-----------------+-------------+----------+---------+ Dist upper arm       0.38                         +-----------------+-------------+----------+---------+ Antecubital fossa    0.21               branching +-----------------+-------------+----------+---------+ Prox forearm  0.26                         +-----------------+-------------+----------+---------+ Mid forearm           0.18                         +-----------------+-------------+----------+---------+ Distal forearm       0.13               branching +-----------------+-------------+----------+---------+ Elbow                0.31                         +-----------------+-------------+----------+---------+ Wrist                0.15                         +-----------------+-------------+----------+---------+ *See table(s) above for measurements and observations.  Diagnosing physician: Debby Robertson Electronically signed by Debby Robertson on 09/21/2024 at 8:16:22 PM.    Final    IR TUNNELED CENTRAL VENOUS CATH [facility] W IMG Result Date: 09/18/2024 INDICATION: ESRD requiring HD initiation. EXAM: TUNNELED CENTRAL VENOUS HEMODIALYSIS CATHETER PLACEMENT WITH ULTRASOUND AND FLUOROSCOPIC GUIDANCE MEDICATIONS: Ancef  1 gm IV. The antibiotic was given in an appropriate time interval prior to skin puncture. ANESTHESIA/SEDATION: Local anesthetic and single agent sedation was employed during this procedure. A total of Versed  2 mg was administered intravenously. The patient's level of consciousness and vital signs were monitored continuously by radiology nursing throughout the procedure under my direct supervision. FLUOROSCOPY: Radiation Exposure Index and estimated peak skin dose (PSD); Reference air kerma (RAK), 2.4 mGy. COMPLICATIONS: None immediate. PROCEDURE: Informed written consent was obtained from the patient after a discussion of the risks, benefits, and alternatives to treatment. Questions regarding the procedure were encouraged and answered. The RIGHT neck and chest were prepped with chlorhexidine  in a sterile fashion, and a sterile drape was applied covering the operative field. Maximum barrier sterile technique with sterile gowns and gloves were used for the procedure. A timeout was performed prior to the initiation of the procedure. After creating a small venotomy incision, a micropuncture kit was  utilized to access the internal jugular vein. Real-time ultrasound guidance was utilized for vascular access including the acquisition of a permanent ultrasound image documenting patency of the accessed vessel. The microwire was utilized to measure appropriate catheter length. A stiff Glidewire was advanced to the level of the IVC and the micropuncture sheath was exchanged for a peel-away sheath. A palindrome tunneled hemodialysis catheter measuring 19 cm from tip to cuff was tunneled in a retrograde fashion from the anterior chest wall to the venotomy incision. The catheter was then placed through the peel-away sheath with tips ultimately positioned within the superior aspect of the right atrium. Final catheter positioning was confirmed and documented with a spot radiographic image. The catheter aspirates and flushes normally. The catheter was flushed with appropriate volume heparin  dwells. The catheter exit site was secured with a 0-Prolene retention suture. The venotomy incision was closed with Dermabond. Dressings were applied. The patient tolerated the procedure well without immediate post procedural complication. IMPRESSION: Successful placement of 19 cm tip to cuff tunneled hemodialysis catheter via the RIGHT internal jugular vein. The tip of the catheter is positioned at the superior cavo-atrial junction. The catheter is ready for immediate use. Thom  Mugweru, MD [facility] Electronically Signed   By: Thom Hall M.D.   On: 09/18/2024 16:51   CT ABDOMEN PELVIS WO CONTRAST Result Date: 09/17/2024 EXAM: CT ABDOMEN AND PELVIS WITHOUT CONTRAST 09/17/2024 04:13:13 PM TECHNIQUE: CT of the abdomen and pelvis was performed without the administration of intravenous contrast. Multiplanar reformatted images are provided for review. Automated exposure control, iterative reconstruction, and/or weight-based adjustment of the mA/kV was utilized to reduce the  radiation dose to as low as reasonably achievable. COMPARISON: None available. CLINICAL HISTORY: Acute, nonlocalized abdominal pain, including lower abdominal pain for 2 weeks. FINDINGS: LOWER CHEST: Mild scarring or atelectasis in the lingula and anteriorly in the left lower lobe. Coronary and descending thoracic aortic atherosclerosis. LIVER: The liver is unremarkable. GALLBLADDER AND BILE DUCTS: Gallbladder is unremarkable. No biliary ductal dilatation. SPLEEN: No acute abnormality. PANCREAS: No acute abnormality. ADRENAL GLANDS: No acute abnormality. KIDNEYS, URETERS AND BLADDER: Right nephrectomy. Left ureteral stent with proximal loop formed in the left kidney upper pole collecting system and distal portion in the urinary bladder. Left perirenal stranding minimally increased from 07/29/2024. Stranding around the left renal collecting system and left ureter. No overt hydronephrosis. No stones in the kidneys or ureters. Urinary bladder contains the distal portion of the left ureteral stent. GI AND BOWEL: Stomach demonstrates no acute abnormality. Sigmoid colon diverticulosis. There is no bowel obstruction. PERITONEUM AND RETROPERITONEUM: No ascites. No free air. VASCULATURE: Substantial atheromatous vascular calcifications of the abdominal aorta and its branches including atheromatous plaque at the origins of the celiac trunk and superior mesenteric artery. Aorta is normal in caliber. LYMPH NODES: Retroperitoneal adenopathy especially eccentric to the left in the periaortic region including a 1.2 cm left periaortic lymph node on image 43 series 2, formerly 1.1 cm. A right gastric node measures 0.8 cm in short axis on image 24 series 2, formerly 0.7 cm. REPRODUCTIVE ORGANS: Uterus absent. BONES AND SOFT TISSUES: Lower thoracic and lumbar spondylosis and degenerative disc disease. No acute osseous abnormality. No focal soft tissue abnormality. IMPRESSION: 1. Left ureteral stent in place with periureteral and  pericollecting system stranding and minimally increased left perirenal stranding, without overt hydronephrosis. 2. Retroperitoneal adenopathy with slight interval increase, including a 1.2 cm left periaortic lymph node. 3. Sigmoid colon diverticulosis without evidence of diverticulitis. 4. Lower thoracic and lumbar spondylosis and degenerative disc disease. 5. Several additional chronic and incidental findings including coronary and descending thoracic aortic atherosclerosis, substantial atheromatous calcifications of the abdominal aorta and branch vessels with plaque at the celiac and superior mesenteric artery origins, right nephrectomy, mild scarring or atelectasis in the lingula and left lower lobe, and status post hysterectomy. Electronically signed by: Ryan Salvage MD 09/17/2024 05:04 PM EST RP Workstation: [id number]    Microbiology: Results for orders placed or performed during the hospital encounter of 09/17/24  Urine Culture     Status: Abnormal   Collection Time: 09/17/24  8:48 PM   Specimen: Urine, Clean Catch  Result Value Ref Range Status   Specimen Description URINE, CLEAN CATCH  Final   Special Requests   Final    NONE Performed at [facility], [address], [place], [place] [address]    Culture MULTIPLE SPECIES PRESENT, SUGGEST RECOLLECTION (A)  Final   Report Status 09/19/2024 FINAL  Final    Labs: CBC: Recent Labs  Lab 09/17/24 1422 09/18/24 0404 09/20/24 0735 09/22/24 0414  WBC 15.3* 9.9 9.1 9.0  NEUTROABS 12.3*  --   --   --  HGB 8.3* 7.5* 8.4* 8.2*  HCT 25.9* 23.8* 26.6* 26.0*  MCV 91.5 91.2 92.7 91.9  PLT 266 239 139* 183   Basic Metabolic Panel: Recent Labs  Lab 09/18/24 0404 09/19/24 0458 09/20/24 0543 09/21/24 0610 09/22/24 0414  NA 132* 135 137 135 137  K 4.5 3.9 4.3 3.7 3.9  CL 104 101 104 98 101  CO2 16* 23 23 27 28   GLUCOSE 88 77 82 102* 84  BUN 102* 52* 51* 25* 32*  CREATININE 8.99* 5.63* 6.09* 4.00* 4.78*  CALCIUM  8.3*  7.9* 8.1* 8.3* 8.3*  PHOS 6.2* 4.0 4.7* 3.3 3.8   Liver Function Tests: Recent Labs  Lab 09/17/24 1422 09/18/24 0404 09/19/24 0458 09/20/24 0543 09/21/24 0610 09/22/24 0414  AST 14*  --   --   --   --   --   ALT 11  --   --   --   --   --   ALKPHOS 65  --   --   --   --   --   BILITOT 0.5  --   --   --   --   --   PROT 8.2*  --   --   --   --   --   ALBUMIN  3.0* 2.6* 2.5* 2.6* 2.7* 2.7*   CBG: No results for input(s): GLUCAP in the last 168 hours.  Discharge time spent: approximately 45 minutes spent on discharge counseling, evaluation of patient on day of discharge, and coordination of discharge planning with nursing, social work, pharmacy and case management  Signed: Lonni SHAUNNA Dalton, MD [facility] 09/22/2024

## 2024-09-22 NOTE — TOC Transition Note (Signed)
 Transition of Care [facility]) - Discharge Note   Patient Details  Name: [name] MRN: [id number] Date of Birth: 1946-03-26  Transition of Care [facility]) CM/SW Contact:  Tom-Johnson, Harvest Muskrat, RN Phone Number: 09/22/2024, 3:36 PM   Clinical Narrative:     Patient is scheduled for discharge today.  Readmission Risk Assessment done. Outpatient f/u, hospital f/u and discharge instructions on AVS. Prescriptions sent to [facility] pharmacy and patient will receive meds prior discharge. No ICM needs or recommendations noted. Husband, [name] to transport at discharge.  No further ICM needs noted.     Final next level of care: Home/Self Care Barriers to Discharge: Barriers Resolved   Patient Goals and CMS Choice Patient states their goals for this hospitalization and ongoing recovery are:: To return home CMS Medicare.gov Compare Post Acute Care list provided to:: Patient Choice offered to / list presented to : NA      Discharge Placement                Patient to be transferred to facility by: Husband Name of family member notified: [name]    Discharge Plan and Services Additional resources added to the After Visit Summary for                  DME Arranged: N/A DME Agency: NA       HH Arranged: NA HH Agency: NA        Social Drivers of Health (SDOH) Interventions SDOH Screenings   Food Insecurity: No Food Insecurity (09/18/2024)  Housing: Low Risk (09/18/2024)  Transportation Needs: No Transportation Needs (09/18/2024)  Utilities: Not At Risk (09/18/2024)  Social Connections: Patient Declined (09/18/2024)  Tobacco Use: Medium Risk (09/18/2024)     Readmission Risk Interventions    09/22/2024   10:28 AM 07/30/2024   11:30 AM 03/28/2024    1:20 PM  Readmission Risk Prevention Plan  Transportation Screening Complete Complete Complete  PCP or Specialist Appt within 3-5 Days  Complete Complete  HRI or Home Care Consult  Complete Complete  Social Work  Consult for Recovery Care Planning/Counseling  Complete Complete  Palliative Care Screening  Complete Not Applicable  Medication Review Ocean[facility]) Referral to Pharmacy Complete Complete  PCP or Specialist appointment within 3-5 days of discharge Complete    HRI or Home Care Consult Complete    SW Recovery Care/Counseling Consult Complete    Palliative Care Screening Not Applicable    Skilled Nursing Facility Not Applicable

## 2024-09-22 NOTE — TOC CM/SW Note (Signed)
 Transition of Care [facility]) - Inpatient Brief Assessment   Patient Details  Name: [name] MRN: [id number] Date of Birth: Aug 26, 1946  Transition of Care [facility]) CM/SW Contact:    Tom-Johnson, Harvest Muskrat, RN Phone Number: 09/22/2024, 10:29 AM   Clinical Narrative:  Patient presented to the ED with elevated Creatinine level from her Urologist's office. Labs in the ED showed WBC 15.3, Hemoglobin 8.3, BUN 105, Creatinine 10, Bicarb 17.  Urinalysis with blood.  Admitted with Acute Renal Failure/UTI, on IV abx.  Patient has hx of Metastatic Urothelial Carcinoma s/p Rt Nephrectomy, HTN, HLD, CAD, OSA, HFpEF, PE, Lt Ureteral Carcinoma s/p Lt Stent exchange on 08/14/24, GERD, Anemia, and CKD Stage IV which has now progressed to ESRD, now on HD. Patient scheduled for iHD #3 today 09/22/24 and then MWF schedule.  Patient has been Clipped for outpatient HD, awaiting Financial and Medical clearance at this time.   CM spoke with patient at bedside about needs for post hospital transition. Patient states she lives with her husband, [name]. Has one daughter and two supportive siblings. Retired, modified independent, has a cane, walker and shower seat at home.  PCP is [name] RAMAN, MD and uses [facility] on [address].   No ICM needs or recommendations noted at this time.  Patient not Medically ready for discharge.  CM will continue to follow as patient progresses with care towards discharge.        Transition of Care Asessment: Insurance and Status: Insurance coverage has been reviewed Patient has primary care physician: Yes Home environment has been reviewed: Yes Prior level of function:: Modified Independent Prior/Current Home Services: No current home services Social Drivers of Health Review: SDOH reviewed no interventions necessary Readmission risk has been reviewed: Yes Transition of care needs: no transition of care needs at this time

## 2024-09-22 NOTE — Progress Notes (Signed)
 New Dialysis Start (ESRD)   Patient identified as new dialysis start. Kidney Education packet assembled and given. Discussed the following items with patient:     Current medications and possible changes once started:  Discussed that patient's medications may change over time.  Ex; hypertension medications and diabetes medication.  Nephrologists will adjust as needed.   Fluid restrictions reviewed:  32 oz daily goal:  All liquids count; soups, ice, jello, fruits. Will also refer dietitian.   Phosphorus and potassium: Handout given showing high potassium and phosphorus foods.  Alternative food and drink options given. Will also refer dietitian.   Family support:  Husband is in the room, and very supportive   Outpatient Clinic Resources:  Discussed roles of Outpatient clinic staff and advised to make a list of needs, if any, to talk with outpatient staff if needed.   Care plan schedule: Informed patient of Care Plans in outpatient setting and to participate in the care plan.  An invitation would be given from outpatient clinic.    Dialysis Access Options:  Reviewed access options with patient. Discussed in detail about care at home with new AVG & AVF. Reviewed checking bruit and thrill. If dialysis catheter present, educated that patient could not take showers.  Catheter dressing changes were to be done by outpatient clinic staff only.   Home therapy options:  Educated patient about home therapy options:  PD vs home hemo.     Patient verbalized understanding. Will continue to round on patient during admission.    Alfonso Heckler Dialysis Nurse Coordinator [phone] Secure Chat

## 2024-09-22 NOTE — Progress Notes (Signed)
 Patient was tentatively scheduled for left arm AV fistula tomorrow.  I was notified by Dr. Jonel that she is being discharged and has a bed available with plans for outpatient dialysis tomorrow with her catheter.  Patient is amendable to outpatient left arm AV fistula electively.  I will have my office schedule.  Lonni DOROTHA Gaskins, MD [facility] Office: [phone]   Lonni JINNY Gaskins

## 2024-09-23 ENCOUNTER — Other Ambulatory Visit: Payer: Self-pay

## 2024-09-23 ENCOUNTER — Telehealth: Payer: Self-pay

## 2024-09-23 DIAGNOSIS — N186 End stage renal disease: Secondary | ICD-10-CM

## 2024-09-23 SURGERY — ARTERIOVENOUS (AV) FISTULA CREATION
Anesthesia: Choice | Laterality: Left

## 2024-09-23 NOTE — Telephone Encounter (Signed)
 Attempted to call for surgery scheduling. LVM

## 2024-09-23 NOTE — Progress Notes (Signed)
 DISCHARGE NOTE HOME [name] to be discharged Home per MD order. Discussed prescriptions and follow up appointments with the patient. Prescriptions given to patient; medication list explained in detail. Patient verbalized understanding.  Skin clean, dry and intact without evidence of skin break down, no evidence of skin tears noted. IV catheter discontinued intact. Site without signs and symptoms of complications. Dressing and pressure applied. Pt denies pain at the site currently. No complaints noted.  Patient free of lines, drains, and wounds.   An After Visit Summary (AVS) was printed and given to the patient. Patient escorted via wheelchair, to discharg lounge to wait for [name]sand discharged home via private auto.  [name], RN

## 2024-09-23 NOTE — Discharge Planning (Signed)
 [facility]  Initial Hemodialysis Orders  Dialysis center: [facility]  Patient's name: [name] DOB: 1945/12/06 AKI or ESRD: ESRD  Past Medical History: HTN, PE, CAD, OSA, CHF, kidney CA s/p right nephrectomy, left kidney CA s/p ablation and left DJ stent in place   Discharge diagnosis: AKI on CKD 5 - now progressed to ESRD Possible UTI - completed 3-day course Rocephin  Chronic diastolic CHF HTN   Allergies: Allergies[1]  Date of First Dialysis: 09/18/24 Cause of renal disease:  Likely due to volume depletion given 3 weeks of N/V and poor po intake vs progressive CKD to stage V in pt with solitary functioning kidney.  Failed supportive care, now on HD   Dialysis Prescription: Dialysis Frequency: TTS Tx duration: 3.5hrs then 4hrs  BFR: 350 then 400  DFR: 500 then 600 EDW: 73kg  Dialyzer: 180NRe UF profile/Sodium modeling?: None Dialysis Bath: 2 K/2 Ca  Dialysis access:  *TDC placed 09/18/24 in IR *We need to ensure patient has an appt with [facility] in outpatient for permanent access placement. Per [facility] note, they are supposed to be scheduling the appt in outpatient  In Center Medications: Heparin  Dose: 2500 units  Type: Bolus VDRA: Hectorol per protocol Venofer: Per protocol Mircera: *HOLD OFF GIVEN MALIGNANCY* (L ureteral carcinoma) Blood products were not given during this admission  Discharge labs: Hgb: 8.2  K+: 3.9  Ca: 8.3  Phos: 3.8  Alb: 2.7   Please draw routine labs. Additional labs needed: Routine labs per facility protocol  Charmaine Piety, NP       [1]  Allergies Allergen Reactions   Erythromycin Diarrhea and Nausea And Vomiting   Macrodantin [Nitrofurantoin] Diarrhea    And night sweats   Lotensin [Benazepril Hcl] Cough

## 2024-09-25 ENCOUNTER — Telehealth: Payer: Self-pay

## 2024-09-25 NOTE — Progress Notes (Signed)
 Anesthesia Chart Review: Same day workup  78 year old female with pertinent history including former smoker, HTN, PE, OSA not on CPAP, moderate coronary artery disease on cath 10/2019 treated medically, CHF, single kidney s/p right nephroureterectomy for cancer 2008, ESRD on HD.  Patient last seen by cardiology APP Rosaline Bane, NP on 08/08/2024 for preop evaluation prior to undergoing cystoscopy/diagnostic ureteroscopy/stent exchange.  Per note,  Preoperative Cardiovascular Risk Assessment: According to the Revised Cardiac Risk Index (RCRI), her Perioperative Risk of Major Cardiac Event is (%): 0.9. Her Functional Capacity in METs is: 6.05 according to the Duke Activity Status Index (DASI). The patient was advised that if she develops new symptoms prior to surgery to contact our office to arrange for a follow-up visit, and she verbalized understanding. Per office protocol, if patient is without any new symptoms or concerns at the time of their virtual visit, she may hold ASA for 7 days prior to procedure. Please resume ASA as soon as possible postprocedure, at the discretion of the surgeon.  Patient did undergo surgery 07/14/2024 without complication.   She was subsequently admitted 12/10 through 09/22/2024 for acute renal failure on CKD 4 with progression to ESRD requiring dialysis.  TDC was placed during admission.  Per discharge planning notes, she was set up to dialyze on a Tuesday Thursday Saturday schedule.  She will need day of surgery labs and evaluation.  EKG 08/02/2023: Sinus rhythm.  Rate 94.  Atrial premature complexes and couplets.  Short PR interval.  RSR' in V1 or V2, probably normal variant.  TTE 06/01/2022: 1. Left ventricular ejection fraction, by estimation, is 60 to 65%. Left  ventricular ejection fraction by 3D volume is 63 %. The left ventricle has  normal function. The left ventricle has no regional wall motion  abnormalities. Left ventricular diastolic   parameters are  consistent with Grade I diastolic dysfunction (impaired  relaxation).   2. Right ventricular systolic function is normal. The right ventricular  size is normal. There is normal pulmonary artery systolic pressure. The  estimated right ventricular systolic pressure is 35.5 mmHg.   3. The mitral valve is grossly normal. Mild mitral valve regurgitation.   4. The aortic valve is tricuspid. There is mild calcification of the  aortic valve. There is mild thickening of the aortic valve. Aortic valve  regurgitation is not visualized. Aortic valve sclerosis/calcification is  present, without any evidence of  aortic stenosis.   5. There is Severe (Grade IV) plaque involving the ascending aorta.   6. The inferior vena cava is normal in size with <50% respiratory  variability, suggesting right atrial pressure of 8 mmHg.   R/LHC 11/06/2019: ? Normal right heart pressures. ? The left main is short, and widely patent. ? Ostial to proximal LAD diffuse 40 to 50% narrowing. A moderate to large diagonal #1 contains 40 to 50% proximal segmental narrowing, mid 75% narrowing after a bifurcation, and more distal 60% narrowing. ? The circumflex gives 3 obtuse marginal branches. The first moderate-sized obtuse marginal contains ostial eccentric 50 to 60% narrowing. ? The RCA contains eccentric 30 to 40% mid narrowing. No significant obstruction is noted otherwise. ? Normal left ventricular systolic function. EF greater than 50%. LVEDP is normal.   RECOMMENDATIONS:   ? The patient has moderate coronary disease, especially involving the first diagonal. The diagonal is relatively diffusely diseased but could be treated with PCI although it perhaps a slightly increased risk of complications due to the diffuse nature of disease. There is no disease  that would account for prolonged pain chest pain at rest. If symptoms become nitroglycerin responsive consider diagonal PCI if refractory and impacting quality of  life. ? Aggressive risk factor modification     Lynwood Geofm RIGGERS [facility] Short Stay Center/Anesthesiology Phone [phone] 09/25/2024 9:35 AM

## 2024-09-25 NOTE — Anesthesia Preprocedure Evaluation (Signed)
 Anesthesia Evaluation    Airway        Dental   Pulmonary former smoker          Cardiovascular hypertension,      Neuro/Psych    GI/Hepatic   Endo/Other    Renal/GU      Musculoskeletal   Abdominal   Peds  Hematology   Anesthesia Other Findings   Reproductive/Obstetrics                              Anesthesia Physical Anesthesia Plan  ASA:   Anesthesia Plan:    Post-op Pain Management:    Induction:   PONV Risk Score and Plan:   Airway Management Planned:   Additional Equipment:   Intra-op Plan:   Post-operative Plan:   Informed Consent:   Plan Discussed with:   Anesthesia Plan Comments: (PAT note by Lynwood Hope, PA-C:  78 year old female with pertinent history including former smoker, HTN, PE, OSA not on CPAP, moderate coronary artery disease on cath 10/2019 treated medically, CHF, single kidney s/p right nephroureterectomy for cancer 2008, ESRD on HD.  Patient last seen by cardiology APP Rosaline Bane, NP on 08/08/2024 for preop evaluation prior to undergoing cystoscopy/diagnostic ureteroscopy/stent exchange.  Per note,  Preoperative Cardiovascular Risk Assessment: According to the Revised Cardiac Risk Index (RCRI), her Perioperative Risk of Major Cardiac Event is (%): 0.9. Her Functional Capacity in METs is: 6.05 according to the Duke Activity Status Index (DASI). The patient was advised that if she develops new symptoms prior to surgery to contact our office to arrange for a follow-up visit, and she verbalized understanding. Per office protocol, if patient is without any new symptoms or concerns at the time of their virtual visit, she may hold ASA for 7 days prior to procedure. Please resume ASA as soon as possible postprocedure, at the discretion of the surgeon.  Patient did undergo surgery 07/14/2024 without complication.   She was subsequently admitted 12/10 through  09/22/2024 for acute renal failure on CKD 4 with progression to ESRD requiring dialysis.  TDC was placed during admission.  Per discharge planning notes, she was set up to dialyze on a Tuesday Thursday Saturday schedule.  She will need day of surgery labs and evaluation.  EKG 08/02/2023: Sinus rhythm.  Rate 94.  Atrial premature complexes and couplets.  Short PR interval.  RSR' in V1 or V2, probably normal variant.  TTE 06/01/2022: 1. Left ventricular ejection fraction, by estimation, is 60 to 65%. Left  ventricular ejection fraction by 3D volume is 63 %. The left ventricle has  normal function. The left ventricle has no regional wall motion  abnormalities. Left ventricular diastolic   parameters are consistent with Grade I diastolic dysfunction (impaired  relaxation).   2. Right ventricular systolic function is normal. The right ventricular  size is normal. There is normal pulmonary artery systolic pressure. The  estimated right ventricular systolic pressure is 35.5 mmHg.   3. The mitral valve is grossly normal. Mild mitral valve regurgitation.   4. The aortic valve is tricuspid. There is mild calcification of the  aortic valve. There is mild thickening of the aortic valve. Aortic valve  regurgitation is not visualized. Aortic valve sclerosis/calcification is  present, without any evidence of  aortic stenosis.   5. There is Severe (Grade IV) plaque involving the ascending aorta.   6. The inferior vena cava is normal in size with <50% respiratory  variability, suggesting  right atrial pressure of 8 mmHg.   R/LHC 11/06/2019:  Normal right heart pressures.  The left main is short, and widely patent.  Ostial to proximal LAD diffuse 40 to 50% narrowing. A moderate to large diagonal #1 contains 40 to 50% proximal segmental narrowing, mid 75% narrowing after a bifurcation, and more distal 60% narrowing.  The circumflex gives 3 obtuse marginal branches. The first moderate-sized obtuse  marginal contains ostial eccentric 50 to 60% narrowing.  The RCA contains eccentric 30 to 40% mid narrowing. No significant obstruction is noted otherwise.  Normal left ventricular systolic function. EF greater than 50%. LVEDP is normal.   RECOMMENDATIONS:    The patient has moderate coronary disease, especially involving the first diagonal. The diagonal is relatively diffusely diseased but could be treated with PCI although it perhaps a slightly increased risk of complications due to the diffuse nature of disease. There is no disease that would account for prolonged pain chest pain at rest. If symptoms become nitroglycerin responsive consider diagonal PCI if refractory and impacting quality of life.  Aggressive risk factor modification   )         Anesthesia Quick Evaluation

## 2024-09-25 NOTE — Telephone Encounter (Signed)
Attempted call. No VM

## 2024-09-25 NOTE — Progress Notes (Signed)
 SDW call attempt.  Patient returned VM, states someone just called her and her surgery will need to be re-scheduled.  Message sent to Alan Glance, RN at Dr. Lanis' office.

## 2024-09-29 ENCOUNTER — Other Ambulatory Visit: Payer: Self-pay

## 2024-09-29 NOTE — Pre-Procedure Instructions (Addendum)
 SDW CALL  Patient was given pre-op  instructions over the phone. The opportunity was given for the patient to ask questions. No further questions asked. Patient verbalized understanding of instructions given. Instructions left on voicemail for patient as she had nothing to write with during phone call.    PCP - Chrystal Lamarr RAMAN, MD  Cardiologist -  Santo Stanly LABOR, MD    PPM/ICD - denies   Chest x-ray - n/a EKG - DOS Stress Test - 06/30/19 ECHO - 06/01/22 Cardiac Cath - 11/06/19  Sleep Study - n/a   Fasting Blood Sugar - N/A  Blood Thinner Instructions:N/A Aspirin  Instructions: continue ASA  ERAS Protcol - NPO   COVID TEST- N/A   Anesthesia review: yes- see note from Lynwood Hope, PA, pt currently at HD today  Patient denies shortness of breath, fever, cough and chest pain over the phone call    Surgical Instructions    Your procedure is scheduled on September 30, 2024  Report to [facility] Main Entrance A at 8:30 A.M., then check in with the Admitting office.  Call this number if you have problems the morning of surgery:  [phone]    Remember:  Do not eat or drink after midnight the night before your surgery    Take these medicines the morning of surgery with A SIP OF WATER :  Aspirin , carvedilol , imdur , crestor , zoloft  PRN: tylenol , pepcid , zofran , tramadol    As of today, STOP taking any  Aleve, Naproxen, Ibuprofen, Motrin, Advil, Goody's, BC's, all herbal medications, fish oil, and all vitamins.  [facility] is not responsible for any belongings or valuables.  Contacts, glasses, hearing aids, dentures or partials may not be worn into surgery, please bring cases for these belongings   Patients discharged the day of surgery will not be allowed to drive home, and someone needs to stay with them for 24 hours.   SURGICAL WAITING ROOM VISITATION You may have 1 visitor in the pre-op  area at a time determined by the pre-op  nurse. (Visitor may  not switch out) Patients having surgery or a procedure in a hospital may have two support people in the waiting room.   Day of Surgery:  Take a shower the day of or night before with antibacterial soap. Wear Clean/Comfortable clothing the morning of surgery Do not apply any deodorants/lotions.   Do not wear jewelry or makeup Do not wear lotions, powders, perfumes/colognes, or deodorant. Do not shave 48 hours prior to surgery.   Do not bring valuables to the hospital. Do not wear nail polish, gel polish, artificial nails, or any other type of covering on natural nails (fingers and toes)  Remember to brush your teeth WITH YOUR REGULAR TOOTHPASTE.

## 2024-09-30 ENCOUNTER — Encounter (HOSPITAL_COMMUNITY): Payer: Self-pay | Admitting: Vascular Surgery

## 2024-09-30 ENCOUNTER — Other Ambulatory Visit (HOSPITAL_COMMUNITY): Payer: Self-pay

## 2024-09-30 ENCOUNTER — Ambulatory Visit (HOSPITAL_COMMUNITY): Payer: Self-pay | Admitting: Physician Assistant

## 2024-09-30 ENCOUNTER — Encounter (HOSPITAL_COMMUNITY): Payer: Self-pay | Admitting: Physician Assistant

## 2024-09-30 ENCOUNTER — Ambulatory Visit (HOSPITAL_COMMUNITY)
Admission: RE | Admit: 2024-09-30 | Discharge: 2024-09-30 | Disposition: A | Discharge status: Home / Self Care | Attending: Vascular Surgery | Admitting: Vascular Surgery

## 2024-09-30 ENCOUNTER — Encounter (HOSPITAL_COMMUNITY): Admission: RE | Disposition: A | Payer: Self-pay | Source: Home / Self Care | Attending: Vascular Surgery

## 2024-09-30 DIAGNOSIS — Z85528 Personal history of other malignant neoplasm of kidney: Secondary | ICD-10-CM | POA: Insufficient documentation

## 2024-09-30 DIAGNOSIS — I5042 Chronic combined systolic (congestive) and diastolic (congestive) heart failure: Secondary | ICD-10-CM | POA: Insufficient documentation

## 2024-09-30 DIAGNOSIS — I2510 Atherosclerotic heart disease of native coronary artery without angina pectoris: Secondary | ICD-10-CM | POA: Insufficient documentation

## 2024-09-30 DIAGNOSIS — Z992 Dependence on renal dialysis: Secondary | ICD-10-CM | POA: Insufficient documentation

## 2024-09-30 DIAGNOSIS — I700 Atherosclerosis of aorta: Secondary | ICD-10-CM | POA: Insufficient documentation

## 2024-09-30 DIAGNOSIS — D631 Anemia in chronic kidney disease: Secondary | ICD-10-CM | POA: Diagnosis not present

## 2024-09-30 DIAGNOSIS — Z87891 Personal history of nicotine dependence: Secondary | ICD-10-CM | POA: Diagnosis not present

## 2024-09-30 DIAGNOSIS — K219 Gastro-esophageal reflux disease without esophagitis: Secondary | ICD-10-CM | POA: Diagnosis not present

## 2024-09-30 DIAGNOSIS — Z905 Acquired absence of kidney: Secondary | ICD-10-CM | POA: Insufficient documentation

## 2024-09-30 DIAGNOSIS — I132 Hypertensive heart and chronic kidney disease with heart failure and with stage 5 chronic kidney disease, or end stage renal disease: Secondary | ICD-10-CM | POA: Insufficient documentation

## 2024-09-30 DIAGNOSIS — N186 End stage renal disease: Secondary | ICD-10-CM

## 2024-09-30 DIAGNOSIS — I340 Nonrheumatic mitral (valve) insufficiency: Secondary | ICD-10-CM | POA: Insufficient documentation

## 2024-09-30 DIAGNOSIS — Z8589 Personal history of malignant neoplasm of other organs and systems: Secondary | ICD-10-CM | POA: Diagnosis not present

## 2024-09-30 HISTORY — PX: AV FISTULA PLACEMENT: SHX1204

## 2024-09-30 LAB — POCT I-STAT, CHEM 8
BUN: 11 mg/dL (ref 8–23)
Calcium, Ion: 0.88 mmol/L — CL (ref 1.15–1.40)
Chloride: 98 mmol/L (ref 98–111)
Creatinine, Ser: 3.2 mg/dL — ABNORMAL HIGH (ref 0.44–1.00)
Glucose, Bld: 106 mg/dL — ABNORMAL HIGH (ref 70–99)
HCT: 26 % — ABNORMAL LOW (ref 36.0–46.0)
Hemoglobin: 8.8 g/dL — ABNORMAL LOW (ref 12.0–15.0)
Potassium: 3.3 mmol/L — ABNORMAL LOW (ref 3.5–5.1)
Sodium: 139 mmol/L (ref 135–145)
TCO2: 31 mmol/L (ref 22–32)

## 2024-09-30 SURGERY — ARTERIOVENOUS (AV) FISTULA CREATION
Anesthesia: Monitor Anesthesia Care | Site: Arm Upper | Laterality: Left

## 2024-09-30 MED ORDER — LIDOCAINE-EPINEPHRINE (PF) 1 %-1:200000 IJ SOLN
INTRAMUSCULAR | Status: AC
  Filled 2024-09-30: qty 30

## 2024-09-30 MED ORDER — MIDAZOLAM HCL (PF) 2 MG/2ML IJ SOLN: 0.5000 mg | Freq: Once | INTRAMUSCULAR | Status: DC | PRN

## 2024-09-30 MED ORDER — HEPARIN 6000 UNIT IRRIGATION SOLUTION
Status: AC
  Filled 2024-09-30: qty 500

## 2024-09-30 MED ORDER — PROPOFOL 500 MG/50ML IV EMUL: INTRAVENOUS | Status: DC | PRN

## 2024-09-30 MED ORDER — HEPARIN 6000 UNIT IRRIGATION SOLUTION
Status: DC | PRN
  Administered 2024-09-30: 1

## 2024-09-30 MED ORDER — MIDAZOLAM HCL 2 MG/2ML IJ SOLN
INTRAMUSCULAR | Status: AC
  Filled 2024-09-30: qty 2

## 2024-09-30 MED ORDER — ONDANSETRON HCL 4 MG/2ML IJ SOLN
INTRAMUSCULAR | Status: AC
  Filled 2024-09-30: qty 2

## 2024-09-30 MED ORDER — SODIUM CHLORIDE 0.9 % IV SOLN: INTRAVENOUS | Status: DC

## 2024-09-30 MED ORDER — FENTANYL CITRATE (PF) 250 MCG/5ML IJ SOLN
INTRAMUSCULAR | Status: AC
  Filled 2024-09-30: qty 5

## 2024-09-30 MED ORDER — PROPOFOL 10 MG/ML IV BOLUS
INTRAVENOUS | Status: AC
  Filled 2024-09-30: qty 20

## 2024-09-30 MED ORDER — ORAL CARE MOUTH RINSE: 15.0000 mL | Freq: Once | OROMUCOSAL | Status: AC

## 2024-09-30 MED ORDER — LIDOCAINE-EPINEPHRINE (PF) 1.5 %-1:200000 IJ SOLN
INTRAMUSCULAR | Status: DC | PRN
  Administered 2024-09-30: 30 mL via PERINEURAL

## 2024-09-30 MED ORDER — HEPARIN SODIUM (PORCINE) 1000 UNIT/ML IJ SOLN
INTRAMUSCULAR | Status: AC
  Filled 2024-09-30: qty 10

## 2024-09-30 MED ORDER — HEPARIN SODIUM (PORCINE) 1000 UNIT/ML IJ SOLN
INTRAMUSCULAR | Status: DC | PRN
  Administered 2024-09-30: 3000 [IU] via INTRAVENOUS

## 2024-09-30 MED ORDER — 0.9 % SODIUM CHLORIDE (POUR BTL) OPTIME
TOPICAL | Status: DC | PRN
  Administered 2024-09-30: 1000 mL

## 2024-09-30 MED ORDER — TRAMADOL HCL 50 MG PO TABS
50.0000 mg | ORAL_TABLET | Freq: Two times a day (BID) | ORAL | 0 refills | Status: AC | PRN
  Filled 2024-09-30: qty 6, 2d supply, fill #0
  Filled 2024-09-30: qty 4, 2d supply, fill #0

## 2024-09-30 MED ORDER — PHENYLEPHRINE 80 MCG/ML (10ML) SYRINGE FOR IV PUSH (FOR BLOOD PRESSURE SUPPORT)
PREFILLED_SYRINGE | INTRAVENOUS | Status: DC | PRN
  Administered 2024-09-30 (×2): 80 ug via INTRAVENOUS

## 2024-09-30 MED ORDER — OXYCODONE HCL 5 MG/5ML PO SOLN: 5.0000 mg | Freq: Once | ORAL | Status: DC | PRN

## 2024-09-30 MED ORDER — CHLORHEXIDINE GLUCONATE 4 % EX SOLN: 60.0000 mL | Freq: Once | CUTANEOUS | Status: DC

## 2024-09-30 MED ORDER — ACETAMINOPHEN 500 MG PO TABS
1000.0000 mg | ORAL_TABLET | Freq: Once | ORAL | Status: AC
  Administered 2024-09-30: 1000 mg via ORAL
  Filled 2024-09-30: qty 2

## 2024-09-30 MED ORDER — CEFAZOLIN SODIUM-DEXTROSE 2-4 GM/100ML-% IV SOLN
2.0000 g | INTRAVENOUS | Status: AC
  Administered 2024-09-30: 2 g via INTRAVENOUS
  Filled 2024-09-30: qty 100

## 2024-09-30 MED ORDER — FENTANYL CITRATE (PF) 100 MCG/2ML IJ SOLN: 25.0000 ug | INTRAMUSCULAR | Status: DC | PRN

## 2024-09-30 MED ORDER — HEMOSTATIC AGENTS (NO CHARGE) OPTIME
TOPICAL | Status: DC | PRN
  Administered 2024-09-30: 1 via TOPICAL

## 2024-09-30 MED ORDER — CHLORHEXIDINE GLUCONATE 0.12 % MT SOLN
15.0000 mL | Freq: Once | OROMUCOSAL | Status: AC
  Administered 2024-09-30: 15 mL via OROMUCOSAL
  Filled 2024-09-30: qty 15

## 2024-09-30 MED ORDER — PAPAVERINE HCL 30 MG/ML IJ SOLN
INTRAMUSCULAR | Status: AC
  Filled 2024-09-30: qty 2

## 2024-09-30 MED ORDER — FENTANYL CITRATE (PF) 250 MCG/5ML IJ SOLN
INTRAMUSCULAR | Status: DC | PRN
  Administered 2024-09-30: 25 ug via INTRAVENOUS
  Administered 2024-09-30: 50 ug via INTRAVENOUS

## 2024-09-30 MED ORDER — CARVEDILOL 3.125 MG PO TABS
6.2500 mg | ORAL_TABLET | Freq: Once | ORAL | Status: AC
  Administered 2024-09-30: 6.25 mg via ORAL
  Filled 2024-09-30: qty 2

## 2024-09-30 MED ORDER — OXYCODONE HCL 5 MG PO TABS: 5.0000 mg | ORAL_TABLET | Freq: Once | ORAL | Status: DC | PRN

## 2024-09-30 MED ORDER — PROPOFOL 500 MG/50ML IV EMUL
INTRAVENOUS | Status: DC | PRN
  Administered 2024-09-30: 100 ug/kg/min via INTRAVENOUS

## 2024-09-30 MED ORDER — LACTATED RINGERS IV SOLN: INTRAVENOUS | Status: DC

## 2024-09-30 MED ORDER — PHENYLEPHRINE HCL-NACL 20-0.9 MG/250ML-% IV SOLN
INTRAVENOUS | Status: DC | PRN
  Administered 2024-09-30: 35 ug/min via INTRAVENOUS

## 2024-09-30 SURGICAL SUPPLY — 27 items
ARMBAND PINK RESTRICT EXTREMIT (MISCELLANEOUS) ×1 IMPLANT
BAG COUNTER SPONGE SURGICOUNT (BAG) ×1 IMPLANT
CANISTER SUCTION 3000ML PPV (SUCTIONS) ×1 IMPLANT
CLIP TI MEDIUM 6 (CLIP) ×1 IMPLANT
CLIP TI WIDE RED SMALL 6 (CLIP) ×1 IMPLANT
COVER PROBE W GEL 5X96 (DRAPES) IMPLANT
DERMABOND ADVANCED .7 DNX12 (GAUZE/BANDAGES/DRESSINGS) ×1 IMPLANT
ELECTRODE REM PT RTRN 9FT ADLT (ELECTROSURGICAL) ×1 IMPLANT
GLOVE BIOGEL PI IND STRL 7.0 (GLOVE) ×1 IMPLANT
GOWN STRL REUS W/ TWL LRG LVL3 (GOWN DISPOSABLE) ×2 IMPLANT
GOWN STRL REUS W/ TWL XL LVL3 (GOWN DISPOSABLE) ×1 IMPLANT
HEMOSTAT SNOW SURGICEL 2X4 (HEMOSTASIS) IMPLANT
INSERT FOGARTY SM (MISCELLANEOUS) IMPLANT
KIT BASIN OR (CUSTOM PROCEDURE TRAY) ×1 IMPLANT
KIT TURNOVER KIT B (KITS) ×1 IMPLANT
PACK CV ACCESS (CUSTOM PROCEDURE TRAY) ×1 IMPLANT
PAD ARMBOARD POSITIONER FOAM (MISCELLANEOUS) ×2 IMPLANT
POWDER SURGICEL 3.0 GRAM (HEMOSTASIS) IMPLANT
SLING ARM FOAM STRAP LRG (SOFTGOODS) IMPLANT
SOLN 0.9% NACL POUR BTL 1000ML (IV SOLUTION) ×1 IMPLANT
SOLN STERILE WATER BTL 1000 ML (IV SOLUTION) ×1 IMPLANT
SUT MNCRL AB 4-0 PS2 18 (SUTURE) ×1 IMPLANT
SUT PROLENE 6 0 BV (SUTURE) ×1 IMPLANT
SUT VIC AB 3-0 SH 27X BRD (SUTURE) ×1 IMPLANT
TOWEL GREEN STERILE (TOWEL DISPOSABLE) ×1 IMPLANT
UNDERPAD 30X36 HEAVY ABSORB (UNDERPADS AND DIAPERS) ×1 IMPLANT
VASCULAR TIE MINI RED 18IN STL (MISCELLANEOUS) IMPLANT

## 2024-09-30 NOTE — H&P (Signed)
 " Hospital H&P   MRN #:  [id number]  History of Present Illness: This is a 78 y.o. female presenting for L arm AV acess creation.   Past Medical History:  Diagnosis Date   Anemia associated with chronic renal failure    Arthritis    Blood dyscrasia 2008   hx of PE   CAD in native artery 10/2019   cardiologist-   dr christella. santo;   a. 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   urologist--- dr dahlstedt/  oncologist--- dr amadeo;   dx 09/ 2020 high grade urothelial  s/p laser ablation, ;  recurrent s/p BCG instillation,  completed chemo instilation 11/ 2022  and repeat chemo completed 05/ 2023   Chronic combined systolic and diastolic CHF (congestive heart failure) (HCC) 06/2019   followed by cardiology;   dx 09/ 2022 in setting of sepsis, pulm edema;   05/ 2022  ef 40-45% per echo,  recovered per cath 01/ 2021 ef 50%;  laste echo 08/ 2023  ef 60-65%   CKD (chronic kidney disease), stage III (HCC)    Depression    Dysrhythmia    bradycardia   GAD (generalized anxiety disorder)    GERD (gastroesophageal reflux disease)    History of bladder cancer 12/2006   followed by dr matilda   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   dx 04/ 2008  w/ poor function hydronephrotic  right kidney;   02-06-2007  s/p right nephroureterectomy   History of kidney stones    History of pulmonary embolism 04/2022   in setting severe sepsis;  small RLL, treated w/ 3 months eliquis    Hyperlipidemia, mixed    Hypertension    Solitary kidney, acquired 02/06/2007   s/p  right nephroureterectomy for cancer    Past Surgical History:  Procedure Laterality Date   CATARACT EXTRACTION W/ INTRAOCULAR LENS IMPLANT Bilateral 2011   CYSTOSCOPY W/ RETROGRADES Left 01/30/2024   Procedure: CYSTOSCOPY, WITH RETROGRADE PYELOGRAM;  Surgeon: Alvaro Ricardo KATHEE Mickey., MD;  Location: [facility];  Service: Urology;  Laterality:  Left;   CYSTOSCOPY W/ RETROGRADES Left 08/14/2024   Procedure: CYSTOSCOPY, WITH RETROGRADE PYELOGRAM;  Surgeon: Alvaro Ricardo KATHEE Mickey., MD;  Location: [facility];  Service: Urology;  Laterality: Left;   CYSTOSCOPY W/ URETERAL STENT PLACEMENT Left 11/17/2019   Procedure: CYSTOSCOPY WITH STENT REPLACEMENT;  Surgeon: Matilda Senior, MD;  Location: [facility];  Service: Urology;  Laterality: Left;   CYSTOSCOPY W/ URETERAL STENT PLACEMENT Left 03/23/2022   Procedure: CYSTOSCOPY WITH RETROGRADE PYELOGRAM, LEFT URETERAL STENT PLACEMENT;  Surgeon: Matilda Senior, MD;  Location: [facility];  Service: Urology;  Laterality: Left;   CYSTOSCOPY W/ URETERAL STENT PLACEMENT Left 06/09/2022   Procedure: CYSTOSCOPY WITH LEFT STENT EXCHANGE;  Surgeon: Matilda Senior, MD;  Location: [facility];  Service: Urology;  Laterality: Left;   CYSTOSCOPY W/ URETERAL STENT PLACEMENT Left 12/04/2022   Procedure: CYSTOSCOPY WITH STENT REPLACEMENT;  Surgeon: Matilda Senior, MD;  Location: [facility];  Service: Urology;  Laterality: Left;   CYSTOSCOPY W/ URETERAL STENT REMOVAL Left 05/10/2020   Procedure: CYSTOSCOPY WITH STENT REMOVAL;  Surgeon: Matilda Senior, MD;  Location: [facility];  Service: Urology;  Laterality: Left;   CYSTOSCOPY W/ URETERAL STENT REMOVAL Left 05/02/2021   Procedure: CYSTOSCOPY WITH STENT REMOVAL;  Surgeon: Matilda Senior, MD;  Location: [facility];  Service: Urology;  Laterality: Left;   CYSTOSCOPY W/ URETERAL STENT REMOVAL Left 11/16/2022   Procedure: CYSTOSCOPY WITH STENT REMOVAL;  Surgeon: Matilda Senior, MD;  Location: [facility];  Service: Urology;  Laterality: Left;   CYSTOSCOPY WITH RETROGRADE PYELOGRAM, URETEROSCOPY AND STENT PLACEMENT Left 06/27/2019   Procedure: CYSTOSCOPY WITH LEFT RETROGRADE PYELOGRAM, URETEROSCOPY, BIOPSY AND LEFT STENT PLACEMENT;  Surgeon: Watt Rush, MD;  Location:  [facility];  Service: Urology;  Laterality: Left;   CYSTOSCOPY WITH RETROGRADE PYELOGRAM, URETEROSCOPY AND STENT PLACEMENT Left 08/18/2019   Procedure: CYSTOSCOPY, URETEROSCOPY AND STENT EXCHANGE;  Surgeon: Matilda Senior, MD;  Location: [facility];  Service: Urology;  Laterality: Left;  90 MINS   CYSTOSCOPY WITH RETROGRADE PYELOGRAM, URETEROSCOPY AND STENT PLACEMENT Left 11/17/2019   Procedure: CYSTOSCOPY WITH RETROGRADE PYELOGRAM, URETEROSCOPY AND STENT PLACEMENT;  Surgeon: Matilda Senior, MD;  Location: [facility];  Service: Urology;  Laterality: Left;  90 MINS   CYSTOSCOPY WITH RETROGRADE PYELOGRAM, URETEROSCOPY AND STENT PLACEMENT Left 05/10/2020   Procedure: CYSTOSCOPY WITH RETROGRADE PYELOGRAM, URETEROSCOPY AND STENT PLACEMENT WITH URETHRAL DIALATION AND BRUSH BIOPSY;  Surgeon: Matilda Senior, MD;  Location: [facility]  Service: Urology;  Laterality: Left;  1 HR   CYSTOSCOPY WITH RETROGRADE PYELOGRAM, URETEROSCOPY AND STENT PLACEMENT Left 09/16/2020   Procedure: CYSTOSCOPY WITH RETROGRADE PYELOGRAM, URETEROSCOPY ,  LITHOPAXY, LEFT URETERAL BRUSHING, AND STENT REPLACEMENT;  Surgeon: Matilda Senior, MD;  Location: [facility];  Service: Urology;  Laterality: Left;   CYSTOSCOPY WITH RETROGRADE PYELOGRAM, URETEROSCOPY AND STENT PLACEMENT Left 10/24/2021   Procedure: CYSTOSCOPY WITH RETROGRADE PYELOGRAM, URETEROSCOPY, POSSIBLE URETERAL AND RENAL BIOPSIES AND STENT PLACEMENT;  Surgeon: Matilda Senior, MD;  Location: [facility];  Service: Urology;  Laterality: Left;  1 HR   CYSTOSCOPY WITH RETROGRADE PYELOGRAM, URETEROSCOPY AND STENT PLACEMENT Left 03/09/2022   Procedure: CYSTOSCOPY WITH ANTEGRADE PYELOGRAM,  STENT REMOVAL;  Surgeon: Matilda Senior, MD;  Location: [facility];  Service: Urology;  Laterality: Left;   CYSTOSCOPY WITH RETROGRADE PYELOGRAM, URETEROSCOPY AND STENT PLACEMENT  01/09/2007   @[facility]  by dr  matilda;    bx's/ washing  bladder , right ureter   CYSTOSCOPY WITH RETROGRADE PYELOGRAM, URETEROSCOPY AND STENT PLACEMENT Left 11/16/2022   Procedure: CYSTOSCOPY WITH RETROGRADE PYELOGRAM, URETEROSCOPY AND STENT REPLACEMENT;  Surgeon: Matilda Senior, MD;  Location: [facility];  Service: Urology;  Laterality: Left;  90 MINS   CYSTOSCOPY WITH RETROGRADE PYELOGRAM, URETEROSCOPY AND STENT PLACEMENT Left 07/06/2023   Procedure: CYSTOSCOPY WITH LEFT RETROGRADE PYELOGRAM, URETEROSCOPY, LASER ABLATION OF TUMOR AND STENT EXCHANGE;  Surgeon: Alvaro Ricardo KATHEE Mickey., MD;  Location: [facility];  Service: Urology;  Laterality: Left;  60 MINUTES NEEDED FOR CASE   CYSTOSCOPY/URETEROSCOPY/HOLMIUM LASER Left 12/04/2022   Procedure: CYSTOSCOPY/URETEROSCOPY/  HOLMIUM LASER OF RENAL PELVIC LESIONS;  Surgeon: Matilda Senior, MD;  Location: [facility];  Service: Urology;  Laterality: Left;   CYSTOSCOPY/URETEROSCOPY/HOLMIUM LASER/STENT PLACEMENT Left 05/02/2021   Procedure: CYSTOSCOPY/URETEROSCOPY WITH BRUSH BIOPSY/ RETROGRADE PYELOGRAM/ HOLMIUM LASER/STENT REPLACEMENT;  Surgeon: Matilda Senior, MD;  Location: [facility];  Service: Urology;  Laterality: Left;   CYSTOSCOPY/URETEROSCOPY/HOLMIUM LASER/STENT PLACEMENT Left 01/30/2024   Procedure: CYSTOSCOPY/DIAGNOSTIC URETEROSCOPY/ RETROGRADE/STENT EXCHANGE;  Surgeon: Alvaro Ricardo KATHEE Mickey., MD;  Location: [facility];  Service: Urology;  Laterality: Left;   CYSTOSCOPY/URETEROSCOPY/HOLMIUM LASER/STENT PLACEMENT Left 08/14/2024   Procedure: CYSTOSCOPY/DIAGNOSTIC URETEROSCOPY/STENT EXCHANGE;  Surgeon: Alvaro Ricardo KATHEE Mickey., MD;  Location: [facility];  Service: Urology;  Laterality: Left;   HOLMIUM  LASER APPLICATION Left 09/16/2020   Procedure: HOLMIUM LASER APPLICATION;  Surgeon: Matilda Senior, MD;  Location: [facility];  Service: Urology;  Laterality: Left;   HOLMIUM LASER APPLICATION Left 10/24/2021   Procedure:  HOLMIUM LASER APPLICATION OF TUMORS;  Surgeon: Matilda Senior, MD;  Location: [facility];  Service: Urology;  Laterality: Left;   HOLMIUM LASER APPLICATION Left 11/16/2022   Procedure: HOLMIUM LASER OF TUMORS;  Surgeon: Matilda Senior, MD;  Location: [facility];  Service: Urology;  Laterality: Left;   IR NEPHROSTOMY PLACEMENT LEFT  07/06/2021   IR NEPHROSTOMY PLACEMENT LEFT  02/06/2022   IR TUNNELED CENTRAL VENOUS CATH PLC W IMG  09/18/2024   NEPHROSTOMY TUBE REMOVAL Left 03/23/2022   Procedure: NEPHROSTOMY TUBE REMOVAL;  Surgeon: Matilda Senior, MD;  Location: [facility];  Service: Urology;  Laterality: Left;   NEPHROURETERECTOMY Right 02/06/2007   @[facility]  by dr matilda;   Laparoscopic   RIGHT/LEFT HEART CATH AND CORONARY ANGIOGRAPHY N/A 11/06/2019   Procedure: RIGHT/LEFT HEART CATH AND CORONARY ANGIOGRAPHY;  Surgeon: Claudene Victory ORN, MD;  Location: [facility];  Service: Cardiovascular;  Laterality: N/A;   THULIUM LASER TURP (TRANSURETHRAL RESECTION OF PROSTATE) Left 08/18/2019   Procedure: THULIUM LASER ABLATION OF URETERAL TUMOR;  Surgeon: Matilda Senior, MD;  Location: [facility];  Service: Urology;  Laterality: Left;   THULIUM LASER TURP (TRANSURETHRAL RESECTION OF PROSTATE) Left 11/17/2019   Procedure: THULIUM LASER of URETERAL CANCER;  Surgeon: Matilda Senior, MD;  Location: [facility];  Service: Urology;  Laterality: Left;   TRANSURETHRAL RESECTION OF BLADDER TUMOR  12/12/2006   @[facility]  by dr andra   VAGINAL HYSTERECTOMY  1980    Allergies[1]  Prior to Admission medications  Medication Sig Start Date End Date Taking? Authorizing Provider  acetaminophen  (TYLENOL ) 500 MG tablet Take 500-1,000 mg by mouth every 6 (six) hours as needed for mild pain.   Yes [provider]  aspirin  EC 81 MG tablet Take 1 tablet (81 mg total) by mouth daily. Swallow whole. 01/09/23  Yes Parthenia Olivia HERO, PA-C   carvedilol  (COREG ) 6.25 MG tablet TAKE 1 TABLET(6.25 MG) BY MOUTH TWICE DAILY WITH A MEAL Patient taking differently: Take 3.125 mg by mouth 2 (two) times daily. 02/04/24  Yes Chandrasekhar, Mahesh A, MD  cholecalciferol  (VITAMIN D3) 25 MCG (1000 UNIT) tablet Take 1,000 Units by mouth every morning.   Yes [provider]  famotidine  (PEPCID ) 10 MG tablet Take 1 tablet by mouth daily as needed for heartburn or indigestion.   Yes [provider]  ferrous sulfate  325 (65 FE) MG tablet Take 325 mg by mouth daily with breakfast.   Yes [provider]  icosapent  Ethyl (VASCEPA ) 1 g capsule Take 1 capsule (1 g total) by mouth 2 (two) times daily. 04/09/24  Yes Chandrasekhar, Mahesh A, MD  isosorbide  mononitrate (IMDUR ) 30 MG 24 hr tablet TAKE 1/2 TABLET(15 MG) BY MOUTH DAILY 03/06/24  Yes Chandrasekhar, Mahesh A, MD  ondansetron  (ZOFRAN ) 4 MG tablet Take 1 tablet (4 mg total) by mouth every 6 (six) hours as needed for nausea. 09/22/24  Yes Danford, Lonni SQUIBB, MD  rosuvastatin  (CRESTOR ) 40 MG tablet TAKE 1 TABLET(40 MG) BY MOUTH DAILY 04/29/24  Yes Chandrasekhar, Mahesh A, MD  sennosides-docusate sodium  (SENOKOT-S) 8.6-50 MG tablet Take 1 tablet by mouth daily.   Yes [provider]  sertraline  (ZOLOFT ) 50 MG tablet Take 75 mg by mouth every morning.   Yes [provider]  sevelamer  carbonate (RENVELA ) 800 MG tablet Take 1 tablet (800 mg total) by mouth 3 (three) times daily with meals. 09/22/24  Yes Danford, Lonni SQUIBB, MD  traMADol  (ULTRAM ) 50 MG tablet Take 1-2 tablets (50-100 mg total) by mouth every 6 (six) hours as needed. For post-op pain. Patient taking differently: Take 50 mg by mouth daily. 08/14/24 08/14/25 Yes Manny, Ricardo KATHEE Raddle., MD  traZODone  (DESYREL ) 150 MG tablet Take 150 mg by mouth at bedtime. 08/24/20  Yes [provider]    Social History   Socioeconomic History   Marital status: Married    Spouse name: Not on file   Number  of children: Not on file   Years of education: Not on file   Highest education level: Not on file  Occupational History   Not on file  Tobacco Use   Smoking status: Former    Current packs/day: 0.00    Average packs/day: 0.5 packs/day for 38.0 years (19.0 ttl pk-yrs)    Types: Cigarettes    Start date: 11/25/1965    Quit date: 11/26/2003    Years since quitting: 20.8   Smokeless tobacco: Never  Vaping Use   Vaping status: Never Used  Substance and Sexual Activity   Alcohol use: Not Currently   Drug use: Never   Sexual activity: Yes    Birth control/protection: Surgical, Post-menopausal  Other Topics Concern   Not on file  Social History Narrative   Not on file   Social Drivers of Health   Tobacco Use: Medium Risk (09/30/2024)   Patient History    Smoking Tobacco Use: Former    Smokeless Tobacco Use: Never    Passive Exposure: Not on Actuary Strain: Not on file  Food Insecurity: No Food Insecurity (09/18/2024)   Epic    Worried About Programme Researcher, Broadcasting/film/video in the Last Year: Never true    Ran Out of Food in the Last Year: Never true  Transportation Needs: No Transportation Needs (09/18/2024)   Epic    Lack of Transportation (Medical): No    Lack of Transportation (Non-Medical): No  Physical Activity: Not on file  Stress: Not on file  Social Connections: Patient Declined (09/18/2024)   Social Connection and Isolation Panel    Frequency of Communication with Friends and Family: Patient declined    Frequency of Social Gatherings with Friends and Family: Patient declined    Attends Religious Services: Patient declined    Database Administrator or Organizations: Patient declined    Attends Banker Meetings: Patient declined    Marital Status: Patient declined  Intimate Partner Violence: Not At Risk (09/18/2024)   Epic    Fear of Current or Ex-Partner: No    Emotionally Abused: No    Physically Abused: No    Sexually Abused: No  Depression  (PHQ2-9): Not on file  Alcohol Screen: Not on file  Housing: Low Risk (09/18/2024)   Epic    Unable to Pay for Housing in the Last Year: No    Number of Times Moved in the Last Year: 0    Homeless in the Last Year: No  Utilities: Not At Risk (09/18/2024)   Epic    Threatened with loss of utilities: No  Health Literacy: Not on file    Family History  Problem Relation Age of Onset   Hypertension Mother    Diabetes Mellitus II Sister    Hypertension Sister    Hypertension Brother  BRCA 1/2 Neg Hx    Breast cancer Neg Hx     ROS: Otherwise negative unless mentioned in HPI  Physical Examination  Vitals:   09/30/24 0821  BP: (!) 138/92  Pulse: 82  Resp: 20  Temp: 98.2 F (36.8 C)  SpO2: 93%   Body mass index is 29.23 kg/m.  General: no acute distress Cardiac: hemodynamically stable Neuro: alert, no focal deficit Extremities: visible L cephalic vein Vascular:   Left: palpale radial and brachial vein   Data:   Vein mapping suggests L cephalic vein adequate for AVF    ASSESSMENT/PLAN: This is a 78 y.o. female with ESRD on HD via R internal jugular TDC. Plan for L arm AVF today    Norman GORMAN Serve MD [facility] [phone] 09/30/2024  12:29 PM     [1]  Allergies Allergen Reactions   Erythromycin Diarrhea and Nausea And Vomiting   Macrodantin [Nitrofurantoin] Diarrhea    And night sweats   Lotensin [Benazepril Hcl] Cough   "

## 2024-09-30 NOTE — Anesthesia Procedure Notes (Signed)
 Anesthesia Regional Block: Supraclavicular block   Pre-Anesthetic Checklist: , timeout performed,  Correct Patient, Correct Site, Correct Laterality,  Correct Procedure, Correct Position, site marked,  Risks and benefits discussed,  Surgical consent,  Pre-op  evaluation,  At surgeon's request and post-op pain management  Laterality: Left and Upper  Prep: chloraprep       Needles:  Injection technique: Single-shot  Needle Type: Echogenic Needle     Needle Length: 9cm  Needle Gauge: 21     Additional Needles:   Procedures:,,,, ultrasound used (permanent image in chart),,    Narrative:  Start time: 09/30/2024 12:42 PM End time: 09/30/2024 12:48 PM Injection made incrementally with aspirations every 5 mL.  Performed by: Personally  Anesthesiologist: Leonce Athens, MD  Additional Notes: Pt identified in Holding room.  Monitors applied. Working IV access confirmed. Timeout, Sterile prep L clavicle and neck.  #21ga ECHOgenic Arrow block needle to supraclavicular brachial plexus with US  guidance.  30cc 1.5% Lidocaine  1:200k epi injected incrementally after negative test dose.  Patient asymptomatic, VSS, no heme aspirated, tolerated well.   JAYSON Leonce, MD

## 2024-09-30 NOTE — Op Note (Signed)
" ° ° °  OPERATIVE NOTE  PROCEDURE:   Intraoperative left arm vein mapping left arm brachiocephalic AVF creation  PRE-OPERATIVE DIAGNOSIS: ESRD  POST-OPERATIVE DIAGNOSIS: same as above   SURGEON: Norman GORMAN Serve MD  ASSISTANT(S): Lucie Apt, PA  Given the complexity of the case,  the assistant was necessary in order to expedient the procedure and safely perform the technical aspects of the operation.  The assistant provided traction and countertraction to assist with exposure of the artery and vein.  They also assisted with suture ligation of multiple venous branches.  They played a critical role in the anastomosis. These skills, especially following the Prolene suture for the anastomosis, could not have been adequately performed by a scrub tech assistant.   ANESTHESIA: regional  ESTIMATED BLOOD LOSS:  minimal  FINDING(S): Sufficiently sized left arm cephalic vein throughout its course although with some sclerosis in the [facility] due to a previous IV Sufficiently sized left brachial artery Palpable and doppler thrill in AVF with multiphasic radial artery on completion  SPECIMEN(S):  none  INDICATIONS:   [name] is a 78 y.o. female with ESRD. The patient is currently on dialysis viaTDC. They were seen in consult for evaluation of hemodialysis access. The risks and benefits of access creation were reviewed including: need for additional procedures, need for additional creations, steal, ischemia monomelic neuropathy, failure of access, and bleeding. The patient expressed understand and is willing to proceed.    DESCRIPTION: The patient was brought to the operating room positioned supine on the operating table.  The left arm was prepped and draped in usual sterile fashion.  Timeout was performed and preoperative antibiotics were administered.  The case began with ultrasound mapping of the brachial artery and cephalic vein, which demonstrated sufficient size at the antecubital fossa  for arteriovenous fistula.  A transverse incision was made 1 finger breadth below the elbow creese in the antecubital fossa. The  cephalic vein was isolated for 3 cm in length. Next the aponeurosis was partially released and the brachial artery secured with a vessel loop. The patient was heparinized. The cephalic vein was transected and ligated distally with a 2-0 silk and vascular clip. The vein was dilated and flushed with heparin  saline. Vascular clamps were placed proximally and distally on the brachial artery and an approximate 6 mm arteriotomy was created on the brachial artery. This was flushed with heparin  saline.  An anastomosis was created in end to side fashion on the brachial artery using running 6-0 Prolene suture. Prior to completing the anastomosis, the vessels were flushed and the suture line was tied down. There was an excellent thrill in the cephalic vein from the anastomosis to the mid upper arm. The patient had a multiphasic radial signal and had an excellent doppler signal in the fistula. The incision was irrigated and hemostasis achieved with cautery and suture. The deeper tissue was closed with 3-0 Vicryl and the skin closed with 4-0 Monocryl. Dermabond was applied to the incisions. The patient was transferred to PACU in stable condition.  COMPLICATIONS: none apparent   CONDITION: stable  Norman GORMAN Serve MD [facility] Phone Number: [phone] 09/30/2024 4:36 PM  "

## 2024-09-30 NOTE — Discharge Instructions (Signed)
" ° °  [facility]  Discharge Instructions  AV Fistula or Graft Surgery for Dialysis Access  Please refer to the following instructions for your post-procedure care. Your surgeon or physician assistant will discuss any changes with you.  Activity  You may drive the day following your surgery, if you are comfortable and no longer taking prescription pain medication. Resume full activity as the soreness in your incision resolves.  Bathing/Showering  You may shower after you go home. Keep your incision dry for 48 hours. Do not soak in a bathtub, hot tub, or swim until the incision heals completely. You may not shower if you have a hemodialysis catheter.  Incision Care  Clean your incision with mild soap and water  after 48 hours. Pat the area dry with a clean towel. You do not need a bandage unless otherwise instructed. Do not apply any ointments or creams to your incision. You may have skin glue on your incision. Do not peel it off. It will come off on its own in about one week. Your arm may swell a bit after surgery. To reduce swelling use pillows to elevate your arm so it is above your heart. Your doctor will tell you if you need to lightly wrap your arm with an ACE bandage.  Diet  Resume your normal diet. There are not special food restrictions following this procedure. In order to heal from your surgery, it is CRITICAL to get adequate nutrition. Your body requires vitamins, minerals, and protein. Vegetables are the best source of vitamins and minerals. Vegetables also provide the perfect balance of protein. Processed food has little nutritional value, so try to avoid this.  Medications  Resume taking all of your medications. If your incision is causing pain, you may take over-the counter pain relievers such as acetaminophen  (Tylenol ). If you were prescribed a stronger pain medication, please be aware these medications can cause nausea and constipation. Prevent  nausea by taking the medication with a snack or meal. Avoid constipation by drinking plenty of fluids and eating foods with high amount of fiber, such as fruits, vegetables, and grains.  Do not take Tylenol  if you are taking prescription pain medications.  Follow up Your surgeon may want to see you in the office following your access surgery. If so, this will be arranged at the time of your surgery.  Please call us  immediately for any of the following conditions:  Increased pain, redness, drainage (pus) from your incision site Fever of 101 degrees or higher Severe or worsening pain at your incision site Hand pain or numbness.  Reduce your risk of vascular disease:  Stop smoking. If you would like help, call [facility] at 1-800-QUIT-NOW (940-531-5519) or [place] at 463 491 2534  Manage your cholesterol Maintain a desired weight Control your diabetes Keep your blood pressure down  Dialysis  It will take several weeks to several months for your new dialysis access to be ready for use. Your surgeon will determine when it is okay to use it. Your nephrologist will continue to direct your dialysis. You can continue to use your Permcath until your new access is ready for use.   09/30/2024 [name] [id number] [birth year]  Surgeon(s): Pearline Norman RAMAN, MD  Procedures: Creation left brachiocephalic AV fistula  x Do not stick fistula for 12 weeks    If you have any questions, please call the office at [phone].  "

## 2024-09-30 NOTE — Anesthesia Postprocedure Evaluation (Signed)
"   Anesthesia Post Note  Patient: FATIM VANDERSCHAAF  Procedure(s) Performed: ARTERIOVENOUS (AV) FISTULA CREATION (Left: Arm Upper)     Patient location during evaluation: PACU Anesthesia Type: Regional Level of consciousness: awake and alert, patient cooperative and oriented Pain management: pain level controlled Vital Signs Assessment: post-procedure vital signs reviewed and stable Respiratory status: spontaneous breathing, nonlabored ventilation and respiratory function stable Cardiovascular status: blood pressure returned to baseline and stable Postop Assessment: no apparent nausea or vomiting, adequate PO intake and able to ambulate Anesthetic complications: no   No notable events documented.  Last Vitals:  Vitals:   09/30/24 1430 09/30/24 1445  BP: 112/61 (!) 113/58  Pulse: 81 80  Resp: 17 16  Temp:    SpO2: 95% 98%    Last Pain:  Vitals:   09/30/24 1445  TempSrc:   PainSc: 0-No pain                 Carmichael Burdette,E. Braiden Rodman      "

## 2024-09-30 NOTE — Transfer of Care (Signed)
 Immediate Anesthesia Transfer of Care Note  Patient: [name]  Procedure(s) Performed: ARTERIOVENOUS (AV) FISTULA CREATION (Left: Arm Upper)  Patient Location: PACU  Anesthesia Type:MAC combined with regional for post-op pain  Level of Consciousness: awake, alert , oriented, and patient cooperative  Airway & Oxygen Therapy: Patient Spontanous Breathing  Post-op Assessment: Report given to RN and Post -op Vital signs reviewed and stable  Post vital signs: Reviewed and stable  Last Vitals:  Vitals Value Taken Time  BP 100/47 09/30/24 14:15  Temp    Pulse 84 09/30/24 14:17  Resp 16 09/30/24 14:17  SpO2 93 % 09/30/24 14:17  Vitals shown include unfiled device data.  Last Pain:  Vitals:   09/30/24 0902  TempSrc:   PainSc: 0-No pain         Complications: No notable events documented.

## 2024-10-01 ENCOUNTER — Encounter (HOSPITAL_COMMUNITY): Payer: Self-pay | Admitting: Vascular Surgery

## 2024-10-01 ENCOUNTER — Telehealth: Payer: Self-pay | Admitting: *Deleted

## 2024-10-01 ENCOUNTER — Telehealth: Payer: Self-pay | Admitting: Internal Medicine

## 2024-10-01 MED ORDER — ROSUVASTATIN CALCIUM 40 MG PO TABS: 40.0000 mg | ORAL_TABLET | Freq: Every day | ORAL | 1 refills | Status: AC

## 2024-10-01 NOTE — Telephone Encounter (Signed)
" °*  STAT* If patient is at the pharmacy, call can be transferred to refill team.   1. Which medications need to be refilled? (please list name of each medication and dose if known)   rosuvastatin  (CRESTOR ) 40 MG tablet     2. Would you like to learn more about the convenience, safety, & potential cost savings by using the [facility]?     3. Are you open to using the [facility] (Type [facility].    4. Which pharmacy/location (including street and city if local pharmacy) is medication to be sent to? [id number] - [place], [place] - [address]     5. Do they need a 30 day or 90 day supply? 90   PT is out of meds "

## 2024-10-01 NOTE — Telephone Encounter (Signed)
 Patient husband called and left message stating patient had surgery yesterday and has some redness at incision site and some numbness in her hand. I called patient and spoke with her she states she has some redness of skin around her incision but denies any drainage. She states she has some numbness in her hand but is able to move her fingers without pain. Advised patient to elevate her arm above heart level and to move her fingers to help with her symptoms. Advised patient if she develops pain, is unable to move her fingersor develops a fever report to the ER. Patient verbalized understanding.

## 2024-10-15 ENCOUNTER — Telehealth: Payer: Self-pay | Admitting: Hematology

## 2024-10-15 NOTE — Telephone Encounter (Signed)
 Received a voicemail from the patient needing to reschedule her appointments on 1/27. Called and spoke with the patient, got her moved to February, she is aware.

## 2024-10-20 ENCOUNTER — Encounter (HOSPITAL_COMMUNITY)
Admission: RE | Admit: 2024-10-20 | Discharge: 2024-10-20 | Disposition: A | Discharge status: Home / Self Care | Source: Ambulatory Visit | Attending: Hematology | Admitting: Hematology

## 2024-10-20 DIAGNOSIS — C689 Malignant neoplasm of urinary organ, unspecified: Secondary | ICD-10-CM | POA: Diagnosis present

## 2024-10-20 LAB — GLUCOSE, CAPILLARY: Glucose-Capillary: 101 mg/dL — ABNORMAL HIGH (ref 70–99)

## 2024-10-20 MED ORDER — FLUDEOXYGLUCOSE F - 18 (FDG) INJECTION
8.1900 | Freq: Once | INTRAVENOUS | Status: AC | PRN
  Administered 2024-10-20: 8.19 via INTRAVENOUS

## 2024-10-30 ENCOUNTER — Other Ambulatory Visit (HOSPITAL_COMMUNITY): Payer: Self-pay

## 2024-11-04 ENCOUNTER — Inpatient Hospital Stay: Admitting: Hematology

## 2024-11-04 ENCOUNTER — Inpatient Hospital Stay

## 2024-11-04 ENCOUNTER — Other Ambulatory Visit: Payer: Self-pay

## 2024-11-04 DIAGNOSIS — N186 End stage renal disease: Secondary | ICD-10-CM

## 2024-11-14 ENCOUNTER — Other Ambulatory Visit: Payer: Self-pay

## 2024-11-14 DIAGNOSIS — D649 Anemia, unspecified: Secondary | ICD-10-CM

## 2024-11-14 DIAGNOSIS — C689 Malignant neoplasm of urinary organ, unspecified: Secondary | ICD-10-CM

## 2024-11-17 ENCOUNTER — Inpatient Hospital Stay: Admitting: Hematology

## 2024-11-17 ENCOUNTER — Inpatient Hospital Stay: Attending: Hematology

## 2024-11-28 ENCOUNTER — Ambulatory Visit (HOSPITAL_COMMUNITY)

## 2024-11-28 ENCOUNTER — Encounter
